# Patient Record
Sex: Male | Born: 1950 | Race: White | Hispanic: No | Marital: Married | Smoking: Never smoker
Health system: Southern US, Community
[De-identification: ages and names within clinical notes are randomized; demographics above are authoritative.]

## PROBLEM LIST (undated history)

## (undated) DIAGNOSIS — R471 Dysarthria and anarthria: Secondary | ICD-10-CM

## (undated) DIAGNOSIS — I87303 Chronic venous hypertension (idiopathic) without complications of bilateral lower extremity: Secondary | ICD-10-CM

## (undated) DIAGNOSIS — I639 Cerebral infarction, unspecified: Secondary | ICD-10-CM

## (undated) DIAGNOSIS — N183 Chronic kidney disease, stage 3 unspecified: Secondary | ICD-10-CM

## (undated) DIAGNOSIS — F418 Other specified anxiety disorders: Secondary | ICD-10-CM

## (undated) DIAGNOSIS — I2699 Other pulmonary embolism without acute cor pulmonale: Secondary | ICD-10-CM

## (undated) DIAGNOSIS — Z9989 Dependence on other enabling machines and devices: Secondary | ICD-10-CM

## (undated) DIAGNOSIS — I69398 Other sequelae of cerebral infarction: Secondary | ICD-10-CM

## (undated) DIAGNOSIS — M62830 Muscle spasm of back: Secondary | ICD-10-CM

## (undated) DIAGNOSIS — R269 Unspecified abnormalities of gait and mobility: Secondary | ICD-10-CM

## (undated) DIAGNOSIS — E785 Hyperlipidemia, unspecified: Secondary | ICD-10-CM

## (undated) DIAGNOSIS — I10 Essential (primary) hypertension: Secondary | ICD-10-CM

## (undated) DIAGNOSIS — G4733 Obstructive sleep apnea (adult) (pediatric): Secondary | ICD-10-CM

## (undated) DIAGNOSIS — E669 Obesity, unspecified: Secondary | ICD-10-CM

## (undated) HISTORY — PX: ANAL FISSURE REPAIR: SHX2312

## (undated) HISTORY — PX: TONSILLECTOMY: SUR1361

---
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
## 2000-06-30 ENCOUNTER — Encounter: Payer: Self-pay | Admitting: Emergency Medicine

## 2000-06-30 ENCOUNTER — Emergency Department (HOSPITAL_COMMUNITY): Admission: EM | Admit: 2000-06-30 | Discharge: 2000-06-30 | Payer: Self-pay | Admitting: Emergency Medicine

## 2000-09-29 ENCOUNTER — Encounter: Payer: Self-pay | Admitting: Urology

## 2000-10-01 ENCOUNTER — Encounter: Payer: Self-pay | Admitting: Urology

## 2000-10-01 ENCOUNTER — Ambulatory Visit (HOSPITAL_COMMUNITY): Admission: RE | Admit: 2000-10-01 | Discharge: 2000-10-01 | Payer: Self-pay | Admitting: Urology

## 2001-12-05 ENCOUNTER — Emergency Department (HOSPITAL_COMMUNITY): Admission: EM | Admit: 2001-12-05 | Discharge: 2001-12-05 | Payer: Self-pay | Admitting: *Deleted

## 2001-12-05 ENCOUNTER — Encounter: Payer: Self-pay | Admitting: *Deleted

## 2003-09-09 ENCOUNTER — Ambulatory Visit (HOSPITAL_BASED_OUTPATIENT_CLINIC_OR_DEPARTMENT_OTHER): Admission: RE | Admit: 2003-09-09 | Discharge: 2003-09-09 | Payer: Self-pay | Admitting: Internal Medicine

## 2004-10-20 ENCOUNTER — Emergency Department (HOSPITAL_COMMUNITY): Admission: EM | Admit: 2004-10-20 | Discharge: 2004-10-20 | Payer: Self-pay

## 2005-05-12 ENCOUNTER — Emergency Department (HOSPITAL_COMMUNITY): Admission: EM | Admit: 2005-05-12 | Discharge: 2005-05-12 | Payer: Self-pay | Admitting: Emergency Medicine

## 2006-02-20 ENCOUNTER — Emergency Department (HOSPITAL_COMMUNITY): Admission: EM | Admit: 2006-02-20 | Discharge: 2006-02-20 | Payer: Self-pay | Admitting: Emergency Medicine

## 2006-12-09 ENCOUNTER — Encounter: Admission: RE | Admit: 2006-12-09 | Discharge: 2006-12-09 | Payer: Self-pay | Admitting: Internal Medicine

## 2007-04-22 ENCOUNTER — Emergency Department (HOSPITAL_COMMUNITY): Admission: EM | Admit: 2007-04-22 | Discharge: 2007-04-22 | Payer: Self-pay | Admitting: Emergency Medicine

## 2007-08-12 ENCOUNTER — Ambulatory Visit (HOSPITAL_COMMUNITY): Admission: RE | Admit: 2007-08-12 | Discharge: 2007-08-12 | Payer: Self-pay | Admitting: General Surgery

## 2008-05-03 ENCOUNTER — Emergency Department (HOSPITAL_BASED_OUTPATIENT_CLINIC_OR_DEPARTMENT_OTHER): Admission: EM | Admit: 2008-05-03 | Discharge: 2008-05-03 | Payer: Self-pay | Admitting: Emergency Medicine

## 2008-06-17 ENCOUNTER — Encounter: Admission: RE | Admit: 2008-06-17 | Discharge: 2008-06-17 | Payer: Self-pay | Admitting: Internal Medicine

## 2009-01-16 ENCOUNTER — Encounter: Admission: RE | Admit: 2009-01-16 | Discharge: 2009-01-16 | Payer: Self-pay | Admitting: Internal Medicine

## 2011-04-01 NOTE — Op Note (Signed)
NAMEMAHKAI, Dakota Hall                 ACCOUNT NO.:  [id number]   MEDICAL RECORD NO.:  [id number]          PATIENT TYPE:  AMB   LOCATION:  DAY                          FACILITY:  [facility]   PHYSICIAN:  Ollen Gross. Vernell Morgans, M.D. DATE OF BIRTH:  01-05-1951   DATE OF PROCEDURE:  08/12/2007  DATE OF DISCHARGE:                               OPERATIVE REPORT   PREOPERATIVE DIAGNOSIS:  Umbilical hernia.   POSTOPERATIVE DIAGNOSIS:  Umbilical hernia.   PROCEDURES:  Umbilical hernia repair.   SURGEON:  Ollen Gross. Vernell Morgans, M.D.   ANESTHESIA:  General endotracheal.   PROCEDURE:  After informed consent was obtained, the patient was brought  to the operating room, placed in supine position on operating table.  After induction of general anesthesia, the patient's abdomen was prepped  with Betadine and draped in usual sterile manner.  The area around the  umbilicus infiltrated 4% Marcaine.  A small transverse supraumbilical  incision was made with a 15 blade knife.  This incision was carried down  through the skin and subcutaneous tissue sharply with electrocautery  until the fascia of the abdominal wall was encountered.  The hernia  defect was readily identified.  The hernia sac was opened sharply with  electrocautery.  There was only some preperitoneal fat within the  defect.  This was excised sharply with electrocautery.  The patient  actually had two adjacent defects, both less then 5 mm or so.  Once the  edges were cleaned of any tissue, the edges appeared to be healthy.  Each defect was closed with interrupted #1 Novofil stitches.  Because  the defects were so small it was decided not to placing the mesh.  Hemostasis was achieved using Bovie electrocautery.  Wound was irrigated  copious amounts of saline.  The deep layer of the wound was closed with  interrupted 3-0 Vicryl stitches.  The skin was closed with a running 4-0  Monocryl subcuticular stitch.  Benzoin, Steri-Strips and sterile  dressings  were applied.  The patient tolerated well.  At end of the case  all needle, sponge, instrument counts correct.  The patient was awake  and taken recovery in stable condition.      Ollen Gross. Vernell Morgans, M.D.  Electronically Signed     PST/MEDQ  D:  08/12/2007  T:  08/12/2007  Job:  [id number]

## 2011-08-28 LAB — DIFFERENTIAL
Basophils Absolute: 0
Basophils Relative: 0
Monocytes Absolute: 0.8 — ABNORMAL HIGH
Neutro Abs: 4.3
Neutrophils Relative %: 55

## 2011-08-28 LAB — BASIC METABOLIC PANEL
BUN: 11
CO2: 29
Calcium: 10.1
Creatinine, Ser: 1.26
GFR calc non Af Amer: 59 — ABNORMAL LOW
Glucose, Bld: 172 — ABNORMAL HIGH

## 2011-08-28 LAB — CBC
Hemoglobin: 15.8
MCHC: 34.5
Platelets: 217
RDW: 12.4

## 2012-05-15 ENCOUNTER — Inpatient Hospital Stay (HOSPITAL_COMMUNITY)
Admission: EM | Admit: 2012-05-15 | Discharge: 2012-05-18 | DRG: 066 | Disposition: A | Discharge status: Home / Self Care | Payer: Managed Care, Other (non HMO) | Attending: Internal Medicine | Admitting: Internal Medicine

## 2012-05-15 ENCOUNTER — Encounter (HOSPITAL_COMMUNITY): Payer: Self-pay

## 2012-05-15 ENCOUNTER — Emergency Department (HOSPITAL_COMMUNITY): Payer: Managed Care, Other (non HMO)

## 2012-05-15 ENCOUNTER — Inpatient Hospital Stay (HOSPITAL_COMMUNITY): Payer: Managed Care, Other (non HMO)

## 2012-05-15 DIAGNOSIS — E1165 Type 2 diabetes mellitus with hyperglycemia: Secondary | ICD-10-CM

## 2012-05-15 DIAGNOSIS — H539 Unspecified visual disturbance: Secondary | ICD-10-CM | POA: Diagnosis present

## 2012-05-15 DIAGNOSIS — I639 Cerebral infarction, unspecified: Secondary | ICD-10-CM

## 2012-05-15 DIAGNOSIS — R51 Headache: Secondary | ICD-10-CM | POA: Diagnosis present

## 2012-05-15 DIAGNOSIS — H532 Diplopia: Secondary | ICD-10-CM | POA: Diagnosis present

## 2012-05-15 DIAGNOSIS — I6350 Cerebral infarction due to unspecified occlusion or stenosis of unspecified cerebral artery: Secondary | ICD-10-CM

## 2012-05-15 DIAGNOSIS — Z9119 Patient's noncompliance with other medical treatment and regimen: Secondary | ICD-10-CM

## 2012-05-15 DIAGNOSIS — Z9114 Patient's other noncompliance with medication regimen: Secondary | ICD-10-CM

## 2012-05-15 DIAGNOSIS — I10 Essential (primary) hypertension: Secondary | ICD-10-CM

## 2012-05-15 DIAGNOSIS — IMO0001 Reserved for inherently not codable concepts without codable children: Secondary | ICD-10-CM

## 2012-05-15 DIAGNOSIS — Z6835 Body mass index (BMI) 35.0-35.9, adult: Secondary | ICD-10-CM

## 2012-05-15 DIAGNOSIS — R209 Unspecified disturbances of skin sensation: Secondary | ICD-10-CM | POA: Diagnosis present

## 2012-05-15 DIAGNOSIS — R471 Dysarthria and anarthria: Secondary | ICD-10-CM | POA: Diagnosis present

## 2012-05-15 DIAGNOSIS — R4781 Slurred speech: Secondary | ICD-10-CM | POA: Diagnosis present

## 2012-05-15 DIAGNOSIS — Z91148 Patient's other noncompliance with medication regimen for other reason: Secondary | ICD-10-CM

## 2012-05-15 DIAGNOSIS — E119 Type 2 diabetes mellitus without complications: Secondary | ICD-10-CM | POA: Diagnosis present

## 2012-05-15 DIAGNOSIS — Z91199 Patient's noncompliance with other medical treatment and regimen due to unspecified reason: Secondary | ICD-10-CM

## 2012-05-15 DIAGNOSIS — E669 Obesity, unspecified: Secondary | ICD-10-CM | POA: Diagnosis present

## 2012-05-15 DIAGNOSIS — R4789 Other speech disturbances: Secondary | ICD-10-CM | POA: Diagnosis present

## 2012-05-15 DIAGNOSIS — Z79899 Other long term (current) drug therapy: Secondary | ICD-10-CM

## 2012-05-15 DIAGNOSIS — E782 Mixed hyperlipidemia: Secondary | ICD-10-CM

## 2012-05-15 HISTORY — DX: Essential (primary) hypertension: I10

## 2012-05-15 LAB — POCT I-STAT, CHEM 8
Creatinine, Ser: 0.9 mg/dL (ref 0.50–1.35)
Glucose, Bld: 298 mg/dL — ABNORMAL HIGH (ref 70–99)
HCT: 51 % (ref 39.0–52.0)
Hemoglobin: 17.3 g/dL — ABNORMAL HIGH (ref 13.0–17.0)
Potassium: 4.2 mEq/L (ref 3.5–5.1)
Sodium: 141 mEq/L (ref 135–145)
TCO2: 24 mmol/L (ref 0–100)

## 2012-05-15 LAB — PROTIME-INR
INR: 1.03 (ref 0.00–1.49)
Prothrombin Time: 13.7 seconds (ref 11.6–15.2)

## 2012-05-15 LAB — COMPREHENSIVE METABOLIC PANEL
ALT: 40 U/L (ref 0–53)
Alkaline Phosphatase: 90 U/L (ref 39–117)
BUN: 11 mg/dL (ref 6–23)
CO2: 25 mEq/L (ref 19–32)
Chloride: 102 mEq/L (ref 96–112)
GFR calc Af Amer: >90 mL/min (ref >90)
GFR calc non Af Amer: 89 mL/min — ABNORMAL LOW (ref >90)
Glucose, Bld: 306 mg/dL — ABNORMAL HIGH (ref 70–99)
Potassium: 4.3 mEq/L (ref 3.5–5.1)
Sodium: 141 mEq/L (ref 135–145)
Total Bilirubin: 0.5 mg/dL (ref 0.3–1.2)
Total Protein: 7.8 g/dL (ref 6.0–8.3)

## 2012-05-15 LAB — CBC
Hemoglobin: 16.9 g/dL (ref 13.0–17.0)
MCH: 31.2 pg (ref 26.0–34.0)
Platelets: 196 10*3/uL (ref 150–400)
RBC: 5.41 MIL/uL (ref 4.22–5.81)
WBC: 10.9 10*3/uL — ABNORMAL HIGH (ref 4.0–10.5)

## 2012-05-15 LAB — DIFFERENTIAL
Eosinophils Absolute: 0 10*3/uL (ref 0.0–0.7)
Lymphocytes Relative: 19 % (ref 12–46)
Lymphs Abs: 2.1 10*3/uL (ref 0.7–4.0)
Monocytes Relative: 3 % (ref 3–12)
Neutro Abs: 8.5 10*3/uL — ABNORMAL HIGH (ref 1.7–7.7)
Neutrophils Relative %: 78 % — ABNORMAL HIGH (ref 43–77)

## 2012-05-15 LAB — GLUCOSE, CAPILLARY: Glucose-Capillary: 256 mg/dL — ABNORMAL HIGH (ref 70–99)

## 2012-05-15 LAB — RAPID URINE DRUG SCREEN, HOSP PERFORMED
Amphetamines: NOT DETECTED
Benzodiazepines: NOT DETECTED
Tetrahydrocannabinol: NOT DETECTED

## 2012-05-15 LAB — TROPONIN I: Troponin I: <0.3 ng/mL (ref <0.30)

## 2012-05-15 LAB — CK TOTAL AND CKMB (NOT AT ARMC): Total CK: 109 U/L (ref 7–232)

## 2012-05-15 MED ORDER — VALSARTAN 160 MG PO TABS
160.0000 mg | ORAL_TABLET | Freq: Two times a day (BID) | ORAL | Status: DC
  Administered 2012-05-15 – 2012-05-17 (×4): 160 mg via ORAL
  Filled 2012-05-15 (×5): qty 1

## 2012-05-15 MED ORDER — SODIUM CHLORIDE 0.9 % IV BOLUS (SEPSIS)
1000.0000 mL | Freq: Once | INTRAVENOUS | Status: AC
  Administered 2012-05-15: 1000 mL via INTRAVENOUS

## 2012-05-15 MED ORDER — SODIUM CHLORIDE 0.9 % IJ SOLN
3.0000 mL | Freq: Two times a day (BID) | INTRAMUSCULAR | Status: DC
  Administered 2012-05-15 – 2012-05-16 (×2): 3 mL via INTRAVENOUS

## 2012-05-15 MED ORDER — SODIUM CHLORIDE 0.9 % IV SOLN
INTRAVENOUS | Status: DC
  Administered 2012-05-15: 100 mL/h via INTRAVENOUS

## 2012-05-15 MED ORDER — SODIUM CHLORIDE 0.9 % IV SOLN: 250.0000 mL | INTRAVENOUS | Status: DC | PRN

## 2012-05-15 MED ORDER — SODIUM CHLORIDE 0.9 % IJ SOLN: 3.0000 mL | INTRAMUSCULAR | Status: DC | PRN

## 2012-05-15 MED ORDER — AMLODIPINE BESYLATE 10 MG PO TABS
10.0000 mg | ORAL_TABLET | Freq: Every day | ORAL | Status: DC
  Administered 2012-05-15 – 2012-05-18 (×4): 10 mg via ORAL
  Filled 2012-05-15 (×4): qty 1

## 2012-05-15 MED ORDER — BUSPIRONE HCL 5 MG PO TABS
15.0000 mg | ORAL_TABLET | Freq: Three times a day (TID) | ORAL | Status: DC
Start: 2012-05-15 — End: 2012-05-18
  Administered 2012-05-15 – 2012-05-18 (×8): 15 mg via ORAL
  Filled 2012-05-15 (×11): qty 3
  Filled 2012-05-15: qty 1

## 2012-05-15 MED ORDER — VALSARTAN-HYDROCHLOROTHIAZIDE 160-12.5 MG PO TABS: 1.0000 | ORAL_TABLET | Freq: Two times a day (BID) | ORAL | Status: DC

## 2012-05-15 MED ORDER — ASPIRIN 81 MG PO CHEW
324.0000 mg | CHEWABLE_TABLET | Freq: Once | ORAL | Status: AC
  Administered 2012-05-15: 324 mg via ORAL

## 2012-05-15 MED ORDER — BIOTENE DRY MOUTH MT LIQD
15.0000 mL | Freq: Two times a day (BID) | OROMUCOSAL | Status: DC
  Administered 2012-05-15 – 2012-05-18 (×5): 15 mL via OROMUCOSAL

## 2012-05-15 MED ORDER — SERTRALINE HCL 50 MG PO TABS
150.0000 mg | ORAL_TABLET | Freq: Every day | ORAL | Status: DC
  Administered 2012-05-15 – 2012-05-18 (×4): 150 mg via ORAL
  Filled 2012-05-15 (×4): qty 1

## 2012-05-15 MED ORDER — HYDROCHLOROTHIAZIDE 12.5 MG PO CAPS
12.5000 mg | ORAL_CAPSULE | Freq: Two times a day (BID) | ORAL | Status: DC
  Administered 2012-05-15 – 2012-05-17 (×4): 12.5 mg via ORAL
  Filled 2012-05-15 (×5): qty 1

## 2012-05-15 MED ORDER — INSULIN ASPART 100 UNIT/ML ~~LOC~~ SOLN
0.0000 [IU] | Freq: Three times a day (TID) | SUBCUTANEOUS | Status: DC
  Administered 2012-05-16: 3 [IU] via SUBCUTANEOUS
  Administered 2012-05-16: 5 [IU] via SUBCUTANEOUS

## 2012-05-15 MED ORDER — ASPIRIN 300 MG RE SUPP
300.0000 mg | Freq: Every day | RECTAL | Status: DC
  Filled 2012-05-15 (×2): qty 1

## 2012-05-15 MED ORDER — SENNOSIDES-DOCUSATE SODIUM 8.6-50 MG PO TABS
1.0000 | ORAL_TABLET | Freq: Every evening | ORAL | Status: DC | PRN
  Filled 2012-05-15: qty 1

## 2012-05-15 MED ORDER — ASPIRIN 325 MG PO TABS
325.0000 mg | ORAL_TABLET | Freq: Every day | ORAL | Status: DC
  Administered 2012-05-16 – 2012-05-18 (×3): 325 mg via ORAL
  Filled 2012-05-15 (×4): qty 1

## 2012-05-15 NOTE — ED Notes (Signed)
Per EMS, pt was outside doing yard work and began having dizziness, double vision, and slurred speech.  Pt's family tried for an hour to get pt to come to hospital for eval before he would agree.  Per EMS, BP  204/92, P  62, CBG 245.  Pt with hx of HTN, DM T2, depression, anxiety.  Non-compliant with all meds.

## 2012-05-15 NOTE — Consult Note (Signed)
Reason for Consult: Code Stroke Referring Physician: Dr Junius Argyle is an 61 y.o. male.  HPI: sudden onset dizziness,perioral numbness and horizontal diplopia at 1 pm today while working in yard. Wife noticed slurred speech as well. EMS reported elevated BP. He is feeling better now but dysarthria and diplopia persist.Ct head shows no acute abnormality only mild small vessel disease and generalized atrophy.. H/O similar episode 1 month ago at work without diplopia resolved in 1 hour did not seek medical help. He has been noncompliant with his medicines and not taken them for 2 weeks except depression pills twice last week only.No known h/o documented stroke or TIA.  Last seen normal : 1 pm today TPA given : No because outside time window  Past Medical History  Diagnosis Date  . Diabetes mellitus   . Hypertension    Depression Obesity  Past Surgical History  Procedure Date  . Tonsillectomy   . Anal fissure repair     No family history on file.  Social History:  reports that he has never smoked. He does not have any smokeless tobacco history on file. He reports that he does not drink alcohol or use illicit drugs.  Allergies:Penicillin  Medications: I have reviewed the patient's current medications.  No results found for this or any previous visit (from the past 48 hour(s)).  Ct Head Wo Contrast  05/15/2012  *RADIOLOGY REPORT*  Clinical Data:  Slurred speech, blurred vision  CT HEAD WITHOUT CONTRAST  Technique:  Contiguous axial images were obtained from the base of the skull through the vertex without contrast.  Comparison: None  Findings: Mild atrophy. Normal ventricular morphology. No midline shift or mass effect. Extensive small vessel chronic ischemic changes of deep cerebral white matter. Arachnoid granulations identified at the sigmoid sinuses bilaterally. No intracranial hemorrhage, mass lesion, or evidence of acute infarction. No extra-axial fluid collections.  Visualized paranasal sinuses and mastoid air cells clear. No acute osseous findings.  IMPRESSION: Atrophy with extensive small vessel chronic ischemic changes of deep cerebral white matter. No acute intracranial abnormalities.  Findings discussed with Dr. Karma Ganja prior to dictation of this report.  Original Report Authenticated By: Lollie Marrow, M.D.       ROS  ; positive for dysarthria, diplopia, dizziness and numbness, depression, emotional lability.  negative for chest pain, palpititons, shortness of breath, nausea, vomitting, diarrhoea.  .Blood pressure 204/91, temperature 98.5 F (36.9 C), temperature source Oral, resp. rate 20, SpO2 97.00%.  PHYSICAL EXAM ;Awake alert. Emotionally labile obese Caucasian male. Afebrile. Head is nontraumatic. Neck is supple without bruit. Hearing is normal. Cardiac exam no murmur or gallop. Lungs are clear to auscultation. Distal pulses are well felt. No pedal edema.  Neurological Exam : Awake  Alert oriented x 3.crying during exam. 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 Face symmetric.Good cough and gag. Tongue midline. Normal strength, tone, reflexes and coordination. Normal sensation. Gait deferred.   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 Plan ; admit to medicine team for stroke w/u and risk stratification. Aspirin after swallow evaluation. Check MRI,MRA brain, carotid dopplers, 2Decho, fasting lipids, HbA1c. PT,OT consults. Consider POINT study participation but patient declined.D/W patient, wife and children.    Telsa Dillavou,PRAMODKUMAR P  05/15/2012, 6:09 PM

## 2012-05-15 NOTE — Code Documentation (Signed)
Code stroke called at 1659, Patient arrived to [facility] via EMs at 1716, stroke team arrived at 1712, CT scan at 1721, lab at 1714, ct read at 49.  LSN 1300, as per patient he was in the yard doing work and developed a headache/ blurred vision and trouble with balance.  Patient hypertensive on arrival, non compliant with home meds.  NIHSS 2 Dr Pearlean Brownie at bedside

## 2012-05-15 NOTE — H&P (Signed)
Chief Complaint:  Slurred speech  HPI: 61 yo male h/o htn, dm off bp meds for over 2 weeks comes in with sudden onset of dizziness, double vision, slurred speech with perioral numbness and tongue numbness.  Symptoms have improved in ED.  Code stroke called and not tpa candidate.  Denies any weakness/n/t in extremities.  Denies any facial drooping.  Does not monitor bp at home but here initial sbp over 200.  No recent illnesses, no n/v/fevers/cp/sob.  Review of Systems:  O/w neg  Past Medical History: Past Medical History  Diagnosis Date  . Diabetes mellitus   . Hypertension    Past Surgical History  Procedure Date  . Tonsillectomy   . Anal fissure repair     Medications: Prior to Admission medications   Medication Sig Start Date End Date Taking? Authorizing Provider  amLODipine (NORVASC) 10 MG tablet Take 10 mg by mouth daily.   Yes Historical Provider, MD  busPIRone (BUSPAR) 15 MG tablet Take 15 mg by mouth 3 (three) times daily.   Yes Historical Provider, MD  metFORMIN (GLUCOPHAGE-XR) 500 MG 24 hr tablet Take 1,000 mg by mouth daily with breakfast.   Yes Historical Provider, MD  pioglitazone (ACTOS) 15 MG tablet Take 15 mg by mouth daily.   Yes Historical Provider, MD  sertraline (ZOLOFT) 100 MG tablet Take 150 mg by mouth daily.   Yes Historical Provider, MD  traZODone (DESYREL) 50 MG tablet Take 50-100 mg by mouth daily as needed. For sleep   Yes Historical Provider, MD  valsartan-hydrochlorothiazide (DIOVAN-HCT) 160-12.5 MG per tablet Take 1 tablet by mouth 2 (two) times daily.   Yes Historical Provider, MD    Allergies:   Allergies  Allergen Reactions  . Penicillins Hives    Social History:  reports that he has never smoked. He does not have any smokeless tobacco history on file. He reports that he does not drink alcohol or use illicit drugs.  Physical Exam: Filed Vitals:   05/15/12 1841 05/15/12 1846 05/15/12 1900 05/15/12 2002  BP:  184/88 148/65 164/72    Pulse:   57 58  Temp: 98.5 F (36.9 C)   98.3 F (36.8 C)  TempSrc:      Resp:  18 15 21   SpO2:  98% 97% 97%   General appearance: alert, cooperative and no distress Lungs: clear to auscultation bilaterally Heart: regular rate and rhythm, S1, S2 normal, no murmur, click, rub or gallop Abdomen: soft, non-tender; bowel sounds normal; no masses,  no organomegaly Extremities: extremities normal, atraumatic, no cyanosis or edema Pulses: 2+ and symmetric Skin: Skin color, texture, turgor normal. No rashes or lesions Neurologic: Mental status: Alert, oriented, thought content appropriate Cranial nerves: normal Motor: grossly normal Reflexes: normal 2+ and symmetric    Labs on Admission:   [facility] 05/15/12 1827 05/15/12 1815  NA 141 141  K 4.2 4.3  CL 104 102  CO2 -- 25  GLUCOSE 298* 306*  BUN 10 11  CREATININE 0.90 0.93  CALCIUM -- 9.9  MG -- --  PHOS -- --    Basename 05/15/12 1815  AST 29  ALT 40  ALKPHOS 90  BILITOT 0.5  PROT 7.8  ALBUMIN 4.2    Basename 05/15/12 1827 05/15/12 1815  WBC -- 10.9*  NEUTROABS -- 8.5*  HGB 17.3* 16.9  HCT 51.0 47.1  MCV -- 87.1  PLT -- 196    Basename 05/15/12 1815  CKTOTAL 109  CKMB 2.3  CKMBINDEX --  TROPONINI <0.30  Radiological Exams on Admission: Ct Head Wo Contrast  05/15/2012  *RADIOLOGY REPORT*  Clinical Data:  Slurred speech, blurred vision  CT HEAD WITHOUT CONTRAST  Technique:  Contiguous axial images were obtained from the base of the skull through the vertex without contrast.  Comparison: None  Findings: Mild atrophy. Normal ventricular morphology. No midline shift or mass effect. Extensive small vessel chronic ischemic changes of deep cerebral white matter. Arachnoid granulations identified at the sigmoid sinuses bilaterally. No intracranial hemorrhage, mass lesion, or evidence of acute infarction. No extra-axial fluid collections. Visualized paranasal sinuses and mastoid air cells clear. No acute osseous  findings.  IMPRESSION: Atrophy with extensive small vessel chronic ischemic changes of deep cerebral white matter. No acute intracranial abnormalities.  Findings discussed with Dr. Karma Ganja prior to dictation of this report.  Original Report Authenticated By: Lollie Marrow, M.D.    Assessment/Plan Present on Admission:  61 yo male r/o cva with uncont htn .Slurred speech .HTN (hypertension) .DM (diabetes mellitus) .Vision changes  cva pathway with full w/u.  Asa.  Neuro following.  Cth negative.  ekg nsr.  Place in stepdown overnight.  Advised needs to take bp meds on a chronic basis and importance of compliance with bp and dm meds.  Place on ssi.   [name] [phone] 05/15/2012, 8:26 PM

## 2012-05-16 ENCOUNTER — Inpatient Hospital Stay (HOSPITAL_COMMUNITY): Payer: Managed Care, Other (non HMO)

## 2012-05-16 DIAGNOSIS — IMO0001 Reserved for inherently not codable concepts without codable children: Secondary | ICD-10-CM

## 2012-05-16 DIAGNOSIS — I6340 Cerebral infarction due to embolism of unspecified cerebral artery: Secondary | ICD-10-CM

## 2012-05-16 DIAGNOSIS — E1165 Type 2 diabetes mellitus with hyperglycemia: Secondary | ICD-10-CM

## 2012-05-16 DIAGNOSIS — H532 Diplopia: Secondary | ICD-10-CM

## 2012-05-16 DIAGNOSIS — E782 Mixed hyperlipidemia: Secondary | ICD-10-CM

## 2012-05-16 DIAGNOSIS — I6350 Cerebral infarction due to unspecified occlusion or stenosis of unspecified cerebral artery: Secondary | ICD-10-CM

## 2012-05-16 DIAGNOSIS — I10 Essential (primary) hypertension: Secondary | ICD-10-CM

## 2012-05-16 LAB — MRSA PCR SCREENING: MRSA by PCR: NEGATIVE

## 2012-05-16 LAB — GLUCOSE, CAPILLARY
Glucose-Capillary: 223 mg/dL — ABNORMAL HIGH (ref 70–99)
Glucose-Capillary: 267 mg/dL — ABNORMAL HIGH (ref 70–99)

## 2012-05-16 LAB — HEMOGLOBIN A1C: Hgb A1c MFr Bld: 10.8 % — ABNORMAL HIGH (ref <5.7)

## 2012-05-16 LAB — LIPID PANEL: LDL Cholesterol: 129 mg/dL — ABNORMAL HIGH (ref 0–99)

## 2012-05-16 MED ORDER — INSULIN ASPART 100 UNIT/ML ~~LOC~~ SOLN
0.0000 [IU] | Freq: Three times a day (TID) | SUBCUTANEOUS | Status: DC
  Administered 2012-05-17: 11 [IU] via SUBCUTANEOUS
  Administered 2012-05-17 – 2012-05-18 (×3): 7 [IU] via SUBCUTANEOUS
  Administered 2012-05-18: 4 [IU] via SUBCUTANEOUS

## 2012-05-16 MED ORDER — PIOGLITAZONE HCL 15 MG PO TABS
15.0000 mg | ORAL_TABLET | Freq: Every day | ORAL | Status: DC
  Administered 2012-05-16 – 2012-05-18 (×3): 15 mg via ORAL
  Filled 2012-05-16 (×3): qty 1

## 2012-05-16 MED ORDER — MORPHINE SULFATE 2 MG/ML IJ SOLN
1.0000 mg | INTRAMUSCULAR | Status: DC | PRN
  Administered 2012-05-16: 2 mg via INTRAVENOUS
  Filled 2012-05-16: qty 1

## 2012-05-16 MED ORDER — INSULIN GLARGINE 100 UNIT/ML ~~LOC~~ SOLN
10.0000 [IU] | Freq: Every day | SUBCUTANEOUS | Status: DC
  Administered 2012-05-16: 10 [IU] via SUBCUTANEOUS

## 2012-05-16 MED ORDER — ACETAMINOPHEN 650 MG RE SUPP
650.0000 mg | Freq: Four times a day (QID) | RECTAL | Status: DC | PRN
  Filled 2012-05-16: qty 1

## 2012-05-16 MED ORDER — HYDRALAZINE HCL 20 MG/ML IJ SOLN
10.0000 mg | INTRAMUSCULAR | Status: DC | PRN
  Administered 2012-05-16: 10 mg via INTRAVENOUS
  Filled 2012-05-16: qty 0.5

## 2012-05-16 MED ORDER — INSULIN ASPART 100 UNIT/ML ~~LOC~~ SOLN
0.0000 [IU] | Freq: Every day | SUBCUTANEOUS | Status: DC
  Administered 2012-05-16: 3 [IU] via SUBCUTANEOUS

## 2012-05-16 MED ORDER — CARVEDILOL 3.125 MG PO TABS
3.1250 mg | ORAL_TABLET | Freq: Two times a day (BID) | ORAL | Status: DC
  Filled 2012-05-16 (×2): qty 1

## 2012-05-16 MED ORDER — OXYCODONE HCL 5 MG PO TABS: 5.0000 mg | ORAL_TABLET | ORAL | Status: DC | PRN

## 2012-05-16 MED ORDER — HYDRALAZINE HCL 20 MG/ML IJ SOLN
10.0000 mg | Freq: Four times a day (QID) | INTRAMUSCULAR | Status: DC | PRN
  Administered 2012-05-16: 10 mg via INTRAVENOUS
  Filled 2012-05-16 (×2): qty 1

## 2012-05-16 NOTE — ED Provider Notes (Addendum)
History     CSN: [id number]  Arrival date & time 05/15/12  1717   First MD Initiated Contact with Patient 05/15/12 1726      Chief Complaint  Patient presents with  . Headache  . Dizziness    (Consider location/radiation/quality/duration/timing/severity/associated sxs/prior treatment) HPI A LEVEL 5 CAVEAT PERTAINS DUE TO ALTERED MENTAL STATUS Pt presents with c/o altered mental status.  Per family pt was out fishing and then afterwards he had slurred speech and dizziness.    Past Medical History  Diagnosis Date  . Diabetes mellitus   . Hypertension     Past Surgical History  Procedure Date  . Tonsillectomy   . Anal fissure repair     History reviewed. No pertinent family history.  History  Substance Use Topics  . Smoking status: Never Smoker   . Smokeless tobacco: Not on file  . Alcohol Use: No      Review of Systems UNABLE TO OBTAIN ROS DUE TO LEVEL 5 CAVEAT  Allergies  Penicillins  Home Medications  No current outpatient prescriptions on file.  BP 153/79  Pulse 56  Temp 98.4 F (36.9 C) (Oral)  Resp 25  Ht 5\' 8"  (1.727 m)  Wt 232 lb 12.9 oz (105.6 kg)  BMI 35.40 kg/m2  SpO2 98% Vitals reviewed Physical Exam Physical Examination: General appearance - alert, somewhat somnolent but following commands, ill appearing Mental status - not able to answer orientation questions Eyes - pupils equal and reactive, extraocular eye movements intact grossly Mouth - mucous membranes moist, pharynx normal without lesions Chest - clear to auscultation, no wheezes, rales or rhonchi, symmetric air entry Heart - normal rate, regular rhythm, normal S1, S2, no murmurs, rubs, clicks or gallops Abdomen - soft, nontender, nondistended, no masses or organomegaly Neurological - alert, slurred speech, ? Aphasia, moving all extremities, ataxia with heel to shin in right leg, decreased sensation over left hand Musculoskeletal - no joint tenderness, deformity or  swelling Extremities - peripheral pulses normal, no pedal edema, no clubbing or cyanosis Skin - normal coloration and turgor  ED Course  Procedures (including critical care time)   Date: 06/17/2012  Rate: 68  Rhythm: normal sinus rhythm  QRS Axis: normal  Intervals: normal  ST/T Wave abnormalities: nonspecific T wave changes- diffuse t wave flattening  Conduction Disutrbances:none  Narrative Interpretation:   Old EKG Reviewed: none available    Labs Reviewed  GLUCOSE, CAPILLARY - Abnormal; Notable for the following:    Glucose-Capillary 295 (*)     All other components within normal limits  CBC - Abnormal; Notable for the following:    WBC 10.9 (*)     All other components within normal limits  DIFFERENTIAL - Abnormal; Notable for the following:    Neutrophils Relative 78 (*)     Neutro Abs 8.5 (*)     All other components within normal limits  COMPREHENSIVE METABOLIC PANEL - Abnormal; Notable for the following:    Glucose, Bld 306 (*)     GFR calc non Af Amer 89 (*)     All other components within normal limits  POCT I-STAT, CHEM 8 - Abnormal; Notable for the following:    Glucose, Bld 298 (*)     Hemoglobin 17.3 (*)     All other components within normal limits  HEMOGLOBIN A1C - Abnormal; Notable for the following:    Hemoglobin A1C 10.8 (*)     Mean Plasma Glucose 263 (*)     All other components  within normal limits  LIPID PANEL - Abnormal; Notable for the following:    Cholesterol 202 (*)     LDL Cholesterol 129 (*)     All other components within normal limits  GLUCOSE, CAPILLARY - Abnormal; Notable for the following:    Glucose-Capillary 256 (*)     All other components within normal limits  GLUCOSE, CAPILLARY - Abnormal; Notable for the following:    Glucose-Capillary 266 (*)     All other components within normal limits  GLUCOSE, CAPILLARY - Abnormal; Notable for the following:    Glucose-Capillary 230 (*)     All other components within normal limits   GLUCOSE, CAPILLARY - Abnormal; Notable for the following:    Glucose-Capillary 223 (*)     All other components within normal limits  GLUCOSE, CAPILLARY - Abnormal; Notable for the following:    Glucose-Capillary 267 (*)     All other components within normal limits  PROTIME-INR  APTT  CK TOTAL AND CKMB  TROPONIN I  URINE RAPID DRUG SCREEN (HOSP PERFORMED)  MRSA PCR SCREENING  CBC   Dg Chest 2 View  05/16/2012  *RADIOLOGY REPORT*  Clinical Data: Nausea, diabetes  CHEST - 2 VIEW  Comparison: 08/09/2007  Findings: Mild cardiomegaly.  Low lung volumes with resultant crowding of bronchovascular structures.  No confluent airspace infiltrate or overt edema.  No definite effusion.  Mildly tortuous thoracic aorta.  IMPRESSION:  1.  Mild cardiomegaly.  Low lung volumes.  Original Report Authenticated By: Osa Craver, M.D.   Ct Head Wo Contrast  05/15/2012  *RADIOLOGY REPORT*  Clinical Data:  Slurred speech, blurred vision  CT HEAD WITHOUT CONTRAST  Technique:  Contiguous axial images were obtained from the base of the skull through the vertex without contrast.  Comparison: None  Findings: Mild atrophy. Normal ventricular morphology. No midline shift or mass effect. Extensive small vessel chronic ischemic changes of deep cerebral white matter. Arachnoid granulations identified at the sigmoid sinuses bilaterally. No intracranial hemorrhage, mass lesion, or evidence of acute infarction. No extra-axial fluid collections. Visualized paranasal sinuses and mastoid air cells clear. No acute osseous findings.  IMPRESSION: Atrophy with extensive small vessel chronic ischemic changes of deep cerebral white matter. No acute intracranial abnormalities.  Findings discussed with Dr. Karma Ganja prior to dictation of this report.  Original Report Authenticated By: Lollie Marrow, M.D.   Mr Brain Wo Contrast  05/16/2012  *RADIOLOGY REPORT*  Clinical Data:  Slurred speech.  Hypertension.  Dizziness.  Visual  disturbance.  MRI HEAD WITHOUT CONTRAST MRA HEAD WITHOUT CONTRAST  Technique:  Multiplanar, multiecho pulse sequences of the brain and surrounding structures were obtained without intravenous contrast. Angiographic images of the head were obtained using MRA technique without contrast.  Comparison:  Head CT 06/29.  MRI 01/16/2009  MRI HEAD  Findings:  Diffusion imaging shows foci of acute infarction affecting the left side of the pons.  The largest area is a 1.2 cm region in the ventral left pons.  There is a 5 mm focus in the dorsal left pons extending to the floor of the fourth ventricle. There is a 3-4 mm acute infarction within the right parietal subcortical white matter.  No other acute infarction.  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  The areas of infarction do not show hemorrhage.  Elsewhere, there are chronic small vessel changes within the pons. No focal cerebellar insult.  The cerebral hemispheres show extensive chronic small vessel changes affecting the deep and subcortical  white matter.  No large vessel territory infarction. No mass lesion, hemorrhage, hydrocephalus or extra-axial collection.  No pituitary mass.  No inflammatory sinus disease.  IMPRESSION: 1.2 cm acute infarction within the left ventral pons.  5 mm acute infarction within the dorsal left pons.  The 3-4 mm acute infarction within the right parietal subcortical white matter. 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  No hemorrhage.  Extensive chronic small vessel changes elsewhere throughout the brain.  MRA HEAD  Findings: Both internal carotid arteries are widely patent into the brain.  No siphon narrowing or irregularity.  The anterior and middle cerebral vessels are patent without proximal stenosis, aneurysm or vascular malformation.  More distal branch vessels shows some atherosclerotic irregularity.  Both vertebral arteries are widely patent to the basilar.  There is mild atherosclerotic irregularity  of the distal left vertebral artery but no flow- limiting stenosis.  The basilar artery shows mild irregularity but no stenosis.  Posterior circulation branch vessels are patent, with mild atherosclerotic irregularity of the distal branches.  IMPRESSION:  No major vessel occlusion or correctable proximal stenosis.  Distal vessel atherosclerotic changes diffusely.  Original Report Authenticated By: Thomasenia Sales, M.D.   Mr Mra Head/brain Wo Cm  05/16/2012  *RADIOLOGY REPORT*  Clinical Data:  Slurred speech.  Hypertension.  Dizziness.  Visual disturbance.  MRI HEAD WITHOUT CONTRAST MRA HEAD WITHOUT CONTRAST  Technique:  Multiplanar, multiecho pulse sequences of the brain and surrounding structures were obtained without intravenous contrast. Angiographic images of the head were obtained using MRA technique without contrast.  Comparison:  Head CT 06/29.  MRI 01/16/2009  MRI HEAD  Findings:  Diffusion imaging shows foci of acute infarction affecting the left side of the pons.  The largest area is a 1.2 cm region in the ventral left pons.  There is a 5 mm focus in the dorsal left pons extending to the floor of the fourth ventricle. There is a 3-4 mm acute infarction within the right parietal subcortical white matter.  No other acute infarction.  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  The areas of infarction do not show hemorrhage.  Elsewhere, there are chronic small vessel changes within the pons. No focal cerebellar insult.  The cerebral hemispheres show extensive chronic small vessel changes affecting the deep and subcortical white matter.  No large vessel territory infarction. No mass lesion, hemorrhage, hydrocephalus or extra-axial collection.  No pituitary mass.  No inflammatory sinus disease.  IMPRESSION: 1.2 cm acute infarction within the left ventral pons.  5 mm acute infarction within the dorsal left pons.  The 3-4 mm acute infarction within the right parietal subcortical white  matter. 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  No hemorrhage.  Extensive chronic small vessel changes elsewhere throughout the brain.  MRA HEAD  Findings: Both internal carotid arteries are widely patent into the brain.  No siphon narrowing or irregularity.  The anterior and middle cerebral vessels are patent without proximal stenosis, aneurysm or vascular malformation.  More distal branch vessels shows some atherosclerotic irregularity.  Both vertebral arteries are widely patent to the basilar.  There is mild atherosclerotic irregularity of the distal left vertebral artery but no flow- limiting stenosis.  The basilar artery shows mild irregularity but no stenosis.  Posterior circulation branch vessels are patent, with mild atherosclerotic irregularity of the distal branches.  IMPRESSION:  No major vessel occlusion or correctable proximal stenosis.  Distal vessel atherosclerotic changes diffusely.  Original Report Authenticated By: Thomasenia Sales, M.D.  1. Slurred speech   2. DM (diabetes mellitus)   3. HTN (hypertension)   4. Noncompliance with medication regimen       MDM  Pt seen emergently upon arrival for code stroke activation- head ct without any acute findings.  Pt seen by neurology and subsequently admitted to medicine for further evaluation.  Several CBgs were checked and he was given d50- which did seem to help his mental status.         Ethelda Chick, MD 05/16/12 2358  Ethelda Chick, MD 06/17/12 [phone]

## 2012-05-16 NOTE — Progress Notes (Signed)
VASCULAR LAB PRELIMINARY  PRELIMINARY  PRELIMINARY  PRELIMINARY  Carotid duplex completed.    Preliminary report:  Bilateral:  No evidence of hemodynamically significant internal carotid artery stenosis.   Vertebral artery flow is antegrade.      Reyanne Hussar, RVT 05/16/2012, 3:43 PM

## 2012-05-16 NOTE — Evaluation (Signed)
Physical Therapy Evaluation Patient Details Name: Dakota Hall MRN: [id number] DOB: [birth year] Today's Date: 05/16/2012 Time: 7829-5621 PT Time Calculation (min): 15 min  PT Assessment / Plan / Recommendation Clinical Impression  Pt presents with a medical diagnosis of L brainstem infarct with symptoms including double vision and slurred speech. Upon sitting, pt with increased BP and not feeling well, therefore was layed back down. Will continue reasessment next session to evaluate balance and gait.    PT Assessment  Patient needs continued PT services    Follow Up Recommendations  Other (comment) (TBD)    Barriers to Discharge        Equipment Recommendations  Other (comment) (TBD)    Recommendations for Other Services     Frequency Min 4X/week    Precautions / Restrictions Restrictions Weight Bearing Restrictions: No         Mobility  Bed Mobility Bed Mobility: Supine to Sit;Sitting - Scoot to Edge of Bed Supine to Sit: 6: Modified independent (Device/Increase time) Sitting - Scoot to Edge of Bed: 6: Modified independent (Device/Increase time) Sit to Supine: 6: Modified independent (Device/Increase time) Transfers Transfers: Not assessed Modified Rankin (Stroke Patients Only) Pre-Morbid Rankin Score: No symptoms Modified Rankin:  (unable to assess)    Exercises     PT Diagnosis: Difficulty walking  PT Problem List: Decreased mobility;Decreased activity tolerance;Decreased knowledge of use of DME;Decreased safety awareness;Decreased knowledge of precautions PT Treatment Interventions: DME instruction;Gait training;Stair training;Functional mobility training;Therapeutic activities;Therapeutic exercise;Neuromuscular re-education;Patient/family education   PT Goals Acute Rehab PT Goals PT Goal Formulation: With patient Time For Goal Achievement: 05/23/12 Potential to Achieve Goals: Good Pt will go Sit to Stand: with modified independence PT Goal: Sit to Stand -  Progress: Goal set today Pt will go Stand to Sit: with modified independence PT Goal: Stand to Sit - Progress: Goal set today Pt will Transfer Bed to Chair/Chair to Bed: with modified independence PT Transfer Goal: Bed to Chair/Chair to Bed - Progress: Goal set today Pt will Ambulate: >150 feet;with modified independence;with least restrictive assistive device PT Goal: Ambulate - Progress: Goal set today Pt will Go Up / Down Stairs: Flight;with modified independence;with rail(s) PT Goal: Up/Down Stairs - Progress: Goal set today  Visit Information  Last PT Received On: 05/16/12 Assistance Needed: +1 PT/OT Co-Evaluation/Treatment: Yes    Subjective Data      Prior Functioning  Home Living Lives With: Spouse Available Help at Discharge: Family;Available 24 hours/day Type of Home: House Home Access: Stairs to enter [employer] of Steps: 4 Entrance Stairs-Rails: Right;Left;Can reach both Home Layout: Two level;Bed/bath upstairs;1/2 bath on main level Alternate Level Stairs-Number of Steps: 13 Alternate Level Stairs-Rails: Left Bathroom Shower/Tub: Health visitor: Standard Bathroom Accessibility: Yes How Accessible: Accessible via walker Home Adaptive Equipment: None Prior Function Level of Independence: Independent Driving: Yes Vocation: Workers Chief Operating Officer: Expressive difficulties Dominant Hand: Right    Cognition  Overall Cognitive Status: Appears within functional limits for tasks assessed/performed Arousal/Alertness: Awake/alert Orientation Level: Appears intact for tasks assessed Behavior During Session: [facility] for tasks performed    Extremity/Trunk Assessment Right Lower Extremity Assessment RLE ROM/Strength/Tone: Within functional levels RLE Sensation: WFL - Light Touch Left Lower Extremity Assessment LLE ROM/Strength/Tone: Within functional levels LLE Sensation: WFL - Light Touch   Balance Balance Balance  Assessed: Yes Static Sitting Balance Static Sitting - Balance Support: Bilateral upper extremity supported;Feet supported Static Sitting - Level of Assistance: 7: Independent Static Sitting - Comment/# of Minutes: pt able to  sit at EOB for 3 minutes with no assistance. Upon sitting, BP taken: 181/84 and pt not feeling well, requesting to lay back down.   End of Session PT - End of Session Activity Tolerance: Treatment limited secondary to medical complications (Comment) Patient left: in bed;with call bell/phone within reach;with family/visitor present Nurse Communication: Mobility status    Milana Kidney 05/16/2012, 3:05 PM  05/16/2012 Milana Kidney DPT PAGER: [phone] OFFICE: [phone]

## 2012-05-16 NOTE — Progress Notes (Signed)
SLP Cancellation Note  Evaluation cancelled today due to pt passed RN stroke swallow screen. RN to initiate diet. Please reconsult ST if needs arise. Dakota Hall, [facility], CCC-SLP [phone]  Dakota Hall, Dakota Hall 05/16/2012, 12:14 PM

## 2012-05-16 NOTE — Progress Notes (Signed)
Pt's BP 181/80; NP notified; new orders received; will continue to monitor

## 2012-05-16 NOTE — Progress Notes (Signed)
  Echocardiogram 2D Echocardiogram has been performed.  Dakota Hall 05/16/2012, 3:20 PM

## 2012-05-16 NOTE — Progress Notes (Signed)
Occupational Therapy Evaluation Patient Details Name: Dakota Hall MRN: [id number] DOB: 1951/10/29 Today's Date: 05/16/2012 Time:  -     OT Assessment / Plan / Recommendation Clinical Impression  Pt admitted with a medical diagnosis of L brainstem infarct with symptoms including double vision and slurred speech. Upon sitting, pt with increased BP and not feeling well, therefore was layed back down. Will benefit from acute OT to address below problem list in prep for d/c home.     OT Assessment  Patient needs continued OT Services    Follow Up Recommendations  Other (comment) (TBD)    Barriers to Discharge      Equipment Recommendations  Other (comment) (TBD)    Recommendations for Other Services    Frequency  Min 3X/week    Precautions / Restrictions Restrictions Weight Bearing Restrictions: No   Pertinent Vitals/Pain BP reading 181/84 while sitting EOB.  Layed pt back down. RN aware.    ADL  Upper Body Dressing: Performed;Independent Where Assessed - Upper Body Dressing: Unsupported sitting Lower Body Dressing: Performed;Independent Where Assessed - Lower Body Dressing: Unsupported sitting Transfers/Ambulation Related to ADLs: Not attempted due to pt with high BP and not feeling well ADL Comments: Pt independent with ADLs at bed level. While sitting EOB, pt with BP reading 181/84 and reporting he didn't feel.  Pt returned supine in bed.      OT Diagnosis: Generalized weakness;Disturbance of vision  OT Problem List: Decreased activity tolerance;Impaired vision/perception OT Treatment Interventions: Self-care/ADL training;Visual/perceptual remediation/compensation;Patient/family education;Therapeutic activities   OT Goals Acute Rehab OT Goals OT Goal Formulation: With patient Time For Goal Achievement: 05/23/12 Potential to Achieve Goals: Good ADL Goals Pt Will Transfer to Toilet: Independently;Ambulation;Comfort height toilet ADL Goal: Toilet Transfer - Progress: Goal  set today Miscellaneous OT Goals Miscellaneous OT Goal #1: Pt will independently retrieve ADL items in prep for grooming at sink. OT Goal: Miscellaneous Goal #1 - Progress: Goal set today Miscellaneous OT Goal #2: Pt will independently perform vision HEP. OT Goal: Miscellaneous Goal #2 - Progress: Goal set today  Visit Information  Assistance Needed: +1    Subjective Data      Prior Functioning  Home Living Lives With: Spouse Available Help at Discharge: Family;Available 24 hours/day Type of Home: House Home Access: Stairs to enter [employer] of Steps: 4 Entrance Stairs-Rails: Right;Left;Can reach both Home Layout: Two level;Bed/bath upstairs;1/2 bath on main level Alternate Level Stairs-Number of Steps: 13 Alternate Level Stairs-Rails: Left Bathroom Shower/Tub: Health visitor: Standard Bathroom Accessibility: Yes How Accessible: Accessible via walker Home Adaptive Equipment: None Prior Function Level of Independence: Independent Driving: Yes Vocation: Workers Chief Operating Officer: Expressive difficulties Dominant Hand: Right    Cognition  Overall Cognitive Status: Appears within functional limits for tasks assessed/performed Arousal/Alertness: Awake/alert Orientation Level: Appears intact for tasks assessed Behavior During Session: [facility] for tasks performed    Extremity/Trunk Assessment Right Upper Extremity Assessment RUE ROM/Strength/Tone: Within functional levels RUE Sensation: WFL - Light Touch;WFL - Proprioception Left Upper Extremity Assessment LUE ROM/Strength/Tone: Within functional levels LUE Sensation: WFL - Light Touch;WFL - Proprioception Right Lower Extremity Assessment RLE ROM/Strength/Tone: Within functional levels RLE Sensation: WFL - Light Touch Left Lower Extremity Assessment LLE ROM/Strength/Tone: Within functional levels LLE Sensation: WFL - Light Touch   Mobility Bed Mobility Bed Mobility: Supine to  Sit;Sitting - Scoot to Edge of Bed Supine to Sit: 6: Modified independent (Device/Increase time) Sitting - Scoot to Edge of Bed: 6: Modified independent (Device/Increase time) Sit to Supine:  6: Modified independent (Device/Increase time)   Exercise    Balance Balance Balance Assessed: Yes Static Sitting Balance Static Sitting - Balance Support: Bilateral upper extremity supported;Feet supported Static Sitting - Level of Assistance: 7: Independent Static Sitting - Comment/# of Minutes: pt able to sit at EOB for 3 minutes with no assistance. Upon sitting, BP taken: 181/84 and pt not feeling well, requesting to lay back down.   End of Session OT - End of Session Activity Tolerance: Treatment limited secondary to medical complications (Comment);Other (comment) (high BP, not feeling well) Patient left: in bed;with call bell/phone within reach;with bed alarm set;with family/visitor present Nurse Communication: Other (comment) (BP reading)  GO    05/16/2012 Cipriano Mile OTR/L Pager [phone] Office [phone]  Cipriano Mile 05/16/2012, 4:50 PM

## 2012-05-16 NOTE — Progress Notes (Signed)
SUBJECTIVE  Dakota Hall is a 61 y.o. male who presented  withsudden onset dizziness,perioral numbness and horizontal diplopia at 1 pm  On 05/15/12 while working in yard. Wife noticed slurred speech as well. EMS reported elevated BP. He is feeling better now but dysarthria and diplopia persist.Ct head shows no acute abnormality only mild small vessel disease and generalized atrophy.. H/O similar episode 1 month ago at work without diplopia resolved in 1 hour did not seek medical help. He has been noncompliant with his medicines and not taken them for 2 weeks except depression pills twice last week only.No known h/o documented stroke or TIA.  He has residual symptoms of diplopia Overall he feels his condition is improving and speech is clear now. OBJECTIVE Most recent Vital Signs: Temp: 98.3 F (36.8 C) (06/30 0740) Temp src: Oral (06/30 0740) BP: 192/98 mmHg (06/30 0723) Pulse Rate: 56  (06/30 0723) Respiratory Rate: 21 O2 Saturdation: 99%  CBG (last 3)   Basename 05/16/12 0750 05/15/12 2220 05/15/12 1814  GLUCAP 266* 256* 295*   Intake/Output from previous day: 06/29 0701 - 06/30 0700 In: -  Out: 600 [Urine:600]  IV Fluid Intake:     . DISCONTD: sodium chloride 100 mL/hr (05/15/12 1841)   Diet:  NPO    Activity:  bedrest  DVT Prophylaxis:  SCDs  Studies: CBC    Component Value Date/Time   WBC 10.9* 05/15/2012 1815   RBC 5.41 05/15/2012 1815   HGB 17.3* 05/15/2012 1827   HCT 51.0 05/15/2012 1827   PLT 196 05/15/2012 1815   MCV 87.1 05/15/2012 1815   MCH 31.2 05/15/2012 1815   MCHC 35.9 05/15/2012 1815   RDW 12.6 05/15/2012 1815   LYMPHSABS 2.1 05/15/2012 1815   MONOABS 0.4 05/15/2012 1815   EOSABS 0.0 05/15/2012 1815   BASOSABS 0.0 05/15/2012 1815   CMP    Component Value Date/Time   NA 141 05/15/2012 1827   K 4.2 05/15/2012 1827   CL 104 05/15/2012 1827   CO2 25 05/15/2012 1815   GLUCOSE 298* 05/15/2012 1827   BUN 10 05/15/2012 1827   CREATININE 0.90 05/15/2012 1827   CALCIUM 9.9 05/15/2012 1815   PROT 7.8 05/15/2012 1815   ALBUMIN 4.2 05/15/2012 1815   AST 29 05/15/2012 1815   ALT 40 05/15/2012 1815   ALKPHOS 90 05/15/2012 1815   BILITOT 0.5 05/15/2012 1815   GFRNONAA 89* 05/15/2012 1815   GFRAA >90 05/15/2012 1815   COAGS Lab Results  Component Value Date   INR 1.03 05/15/2012   Lipid Panel    Component Value Date/Time   CHOL 202* 05/16/2012 0435   TRIG 86 05/16/2012 0435   HDL 56 05/16/2012 0435   CHOLHDL 3.6 05/16/2012 0435   VLDL 17 05/16/2012 0435   LDLCALC 129* 05/16/2012 0435   HgbA1C  Lab Results  Component Value Date   HGBA1C 10.8* 05/16/2012   Urine Drug Screen    Component Value Date/Time   LABOPIA NONE DETECTED 05/15/2012 2245   COCAINSCRNUR NONE DETECTED 05/15/2012 2245   LABBENZ NONE DETECTED 05/15/2012 2245   AMPHETMU NONE DETECTED 05/15/2012 2245   THCU NONE DETECTED 05/15/2012 2245   LABBARB NONE DETECTED 05/15/2012 2245    Alcohol Level No results found for this basename: eth     Dg Chest 2 View  05/16/2012  *RADIOLOGY REPORT*  Clinical Data: Nausea, diabetes  CHEST - 2 VIEW  Comparison: 08/09/2007  Findings: Mild cardiomegaly.  Low lung volumes with resultant crowding of bronchovascular  structures.  No confluent airspace infiltrate or overt edema.  No definite effusion.  Mildly tortuous thoracic aorta.  IMPRESSION:  1.  Mild cardiomegaly.  Low lung volumes.  Original Report Authenticated By: Osa Craver, M.D.   Ct Head Wo Contrast  05/15/2012  *RADIOLOGY REPORT*  Clinical Data:  Slurred speech, blurred vision  CT HEAD WITHOUT CONTRAST  Technique:  Contiguous axial images were obtained from the base of the skull through the vertex without contrast.  Comparison: None  Findings: Mild atrophy. Normal ventricular morphology. No midline shift or mass effect. Extensive small vessel chronic ischemic changes of deep cerebral white matter. Arachnoid granulations identified at the sigmoid sinuses bilaterally. No intracranial hemorrhage,  mass lesion, or evidence of acute infarction. No extra-axial fluid collections. Visualized paranasal sinuses and mastoid air cells clear. No acute osseous findings.  IMPRESSION: Atrophy with extensive small vessel chronic ischemic changes of deep cerebral white matter. No acute intracranial abnormalities.  Findings discussed with Dr. Karma Ganja prior to dictation of this report.  Original Report Authenticated By: Lollie Marrow, M.D.      MRI of the brain:  pending  MRA of the brain:  pending  2D Echocardiogram:  pending  Carotid Doppler:  pending        Physical Exam:    Awake alert.Obese Caucasian male. Afebrile. Head is nontraumatic. Neck is supple without bruit. Hearing is normal. Cardiac exam no murmur or gallop. Lungs are clear to auscultation. Distal pulses are well felt. No pedal edema.  Neurological Exam : Awake Alert oriented x 3  exam. Mild dysarthric speech but normal language.eye movements now without left internuclear opthalmoplegia with left eye horizontal gaze slow but full range and good vertical movements. Face symmetric.Good cough and gag. Tongue midline.  Normal strength, tone, reflexes and coordination. Normal sensation. Gait deferred.     ASSESSMENT [name] is a 61 y.o. male with a left paramedian pontine infarct, secondary to small vessel disease, on no medicines for secondary stroke prevention.  Stroke risk factors:  diabetes mellitus,HT,Obesity  Hospital day # 1  TREATMENT/PLAN Continue aspirin 325 mg orally every day for stroke prevention. And finish stroke work up. Mobilize out of bed. PT,OT consults.Add statin for elevated lipids.Start diet Gates Rigg, MD [facility] Pager: [phone] 05/16/2012 11:23 AM

## 2012-05-16 NOTE — Progress Notes (Signed)
[facility] [place] TEAM 1 - Stepdown/ICU TEAM  PCP:  No primary provider on file.  Subjective: 61 yo male h/o htn, dm off bp meds for over 2 weeks comes in with sudden onset of dizziness, double vision, slurred speech with perioral numbness and tongue numbness. Symptoms improved in the ED. Ct head revealed no acute abnormality - only mild small vessel disease and generalized atrophy.    Pt reports continued diplopia as well as continued slurred speech.  He denies cp, sob, f/c, n/v, or abdom pain.  He admits to poor compliance with this outpt meds.  Objective:  Intake/Output Summary (Last 24 hours) at 05/16/12 1617 Last data filed at 05/16/12 1541  Gross per 24 hour  Intake    360 ml  Output   1200 ml  Net   -840 ml   Blood pressure 159/78, pulse 56, temperature 98.5 F (36.9 C), temperature source Oral, resp. rate 23, height 5\' 8"  (1.727 m), weight 105.6 kg (232 lb 12.9 oz), SpO2 98.00%.  CBG (last 3)   Basename 05/16/12 1150 05/16/12 0750 05/15/12 2220  GLUCAP 230* 266* 256*   Physical Exam: General: No acute respiratory distress Lungs: Clear to auscultation bilaterally without wheezes or crackles Cardiovascular: Regular rate and rhythm without murmur gallop or rub normal S1 and S2 Abdomen: obese, nontender, nondistended, soft, bowel sounds positive, no rebound, no ascites, no appreciable mass Extremities: No significant cyanosis, clubbing, or edema bilateral lower extremities  Lab Results:  [facility] 05/15/12 1827 05/15/12 1815  NA 141 141  K 4.2 4.3  CL 104 102  CO2 -- 25  GLUCOSE 298* 306*  BUN 10 11  CREATININE 0.90 0.93  CALCIUM -- 9.9  MG -- --  PHOS -- --    Basename 05/15/12 1815  AST 29  ALT 40  ALKPHOS 90  BILITOT 0.5  PROT 7.8  ALBUMIN 4.2    Basename 05/15/12 1827 05/15/12 1815  WBC -- 10.9*  NEUTROABS -- 8.5*  HGB 17.3* 16.9  HCT 51.0 47.1  MCV -- 87.1  PLT -- 196    Basename 05/15/12 1815  CKTOTAL 109  CKMB 2.3  CKMBINDEX  --  TROPONINI <0.30    Basename 05/16/12 0415  HGBA1C 10.8*    Micro Results: Recent Results (from the past 240 hour(s))  MRSA PCR SCREENING     Status: Normal   Collection Time   05/15/12  9:39 PM      Component Value Range Status Comment   MRSA by PCR NEGATIVE  NEGATIVE Final     Studies/Results: All recent x-ray/radiology reports have been reviewed in detail.   Medications: I have reviewed the patient's complete medication list.  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, L dorsal pons, R parietal subcortical) MRI reveals the acute lesions - no focal stenosis via MRA - passed RN swallow screnn - no eviidence of ICA stenosis on doppler - on ASA 325 for prophylaxis - Echo accomplished but not yet read - may require TEE given anatomical distribution suggestive of embolic phenom - will discuss further w/ Stroke Team - cont PT/OT   DM Poorly controlled (A1c 10.8) - has been noncompliant w/ meds - adjust tx plan - educate again on diet - follow CBG - ASA + ACE or ARB  Hyperlipidemia LDL 129 in setting of DM and acute CVA - tx is indicated   HTN Reasonably controlled in setting of acute CVA - will begin to slowly titrate meds in AM  Obesity  Counseled on need for weight loss to maximize control of medical illnesses  Dispo Plan for transfer to unit 3000 in AM  Lonia Blood, MD [facility] Office  [phone] Pager [phone]  On-Call/Text Page:      [email or web address]      password [facility]

## 2012-05-17 DIAGNOSIS — I639 Cerebral infarction, unspecified: Secondary | ICD-10-CM

## 2012-05-17 DIAGNOSIS — R4789 Other speech disturbances: Secondary | ICD-10-CM

## 2012-05-17 DIAGNOSIS — I6330 Cerebral infarction due to thrombosis of unspecified cerebral artery: Secondary | ICD-10-CM

## 2012-05-17 DIAGNOSIS — E119 Type 2 diabetes mellitus without complications: Secondary | ICD-10-CM

## 2012-05-17 DIAGNOSIS — H539 Unspecified visual disturbance: Secondary | ICD-10-CM

## 2012-05-17 DIAGNOSIS — I10 Essential (primary) hypertension: Secondary | ICD-10-CM

## 2012-05-17 HISTORY — DX: Cerebral infarction, unspecified: I63.9

## 2012-05-17 LAB — CBC
Hemoglobin: 16.3 g/dL (ref 13.0–17.0)
MCH: 30.6 pg (ref 26.0–34.0)
MCV: 86.5 fL (ref 78.0–100.0)
RBC: 5.32 MIL/uL (ref 4.22–5.81)

## 2012-05-17 LAB — GLUCOSE, CAPILLARY: Glucose-Capillary: 237 mg/dL — ABNORMAL HIGH (ref 70–99)

## 2012-05-17 MED ORDER — ACETAMINOPHEN 325 MG PO TABS: 650.0000 mg | ORAL_TABLET | Freq: Four times a day (QID) | ORAL | Status: DC | PRN

## 2012-05-17 MED ORDER — CARVEDILOL 3.125 MG PO TABS
3.1250 mg | ORAL_TABLET | Freq: Two times a day (BID) | ORAL | Status: DC
  Administered 2012-05-17 – 2012-05-18 (×2): 3.125 mg via ORAL
  Filled 2012-05-17 (×4): qty 1

## 2012-05-17 MED ORDER — METFORMIN HCL ER 500 MG PO TB24
1000.0000 mg | ORAL_TABLET | Freq: Every day | ORAL | Status: DC
  Filled 2012-05-17: qty 2

## 2012-05-17 MED ORDER — IRBESARTAN 150 MG PO TABS
150.0000 mg | ORAL_TABLET | Freq: Every day | ORAL | Status: DC
  Administered 2012-05-18: 150 mg via ORAL
  Filled 2012-05-17: qty 1

## 2012-05-17 MED ORDER — SIMVASTATIN 20 MG PO TABS
20.0000 mg | ORAL_TABLET | Freq: Every day | ORAL | Status: DC
  Administered 2012-05-17: 20 mg via ORAL
  Filled 2012-05-17 (×2): qty 1

## 2012-05-17 MED ORDER — VALSARTAN 160 MG PO TABS: 160.0000 mg | ORAL_TABLET | Freq: Every day | ORAL | Status: DC

## 2012-05-17 MED ORDER — HYDROCHLOROTHIAZIDE 12.5 MG PO CAPS
12.5000 mg | ORAL_CAPSULE | Freq: Every day | ORAL | Status: DC
  Administered 2012-05-18: 12.5 mg via ORAL
  Filled 2012-05-17: qty 1

## 2012-05-17 MED ORDER — INSULIN GLARGINE 100 UNIT/ML ~~LOC~~ SOLN
20.0000 [IU] | Freq: Every day | SUBCUTANEOUS | Status: DC
  Administered 2012-05-17: 20 [IU] via SUBCUTANEOUS

## 2012-05-17 MED ORDER — METFORMIN HCL ER 500 MG PO TB24
1000.0000 mg | ORAL_TABLET | Freq: Two times a day (BID) | ORAL | Status: DC
  Administered 2012-05-17 – 2012-05-18 (×2): 1000 mg via ORAL
  Filled 2012-05-17 (×5): qty 2

## 2012-05-17 NOTE — Progress Notes (Signed)
Physical Therapy Treatment Patient Details Name: Dakota Hall MRN: [id number] DOB: 01/24/1951 Today's Date: 05/17/2012 Time: 2841-3244 PT Time Calculation (min): 19 min  PT Assessment / Plan / Recommendation Comments on Treatment Session  pt presents with CVA.  pt biggest deficit is Diplopia.  OT ordered eye patch for pt.  pt's BP 150/73 supine, 165/84 sitting, and 155/78 standing.      Follow Up Recommendations  Inpatient Rehab    Barriers to Discharge        Equipment Recommendations  None recommended by PT    Recommendations for Other Services    Frequency Min 4X/week   Plan Discharge plan needs to be updated;Frequency remains appropriate    Precautions / Restrictions Precautions Precautions: Fall Precaution Comments: Diplopia Restrictions Weight Bearing Restrictions: No   Pertinent Vitals/Pain Denies pain, but notes nausea.      Mobility  Bed Mobility Bed Mobility: Supine to Sit;Sitting - Scoot to Edge of Bed Supine to Sit: 6: Modified independent (Device/Increase time);HOB elevated Sitting - Scoot to Edge of Bed: 6: Modified independent (Device/Increase time) Details for Bed Mobility Assistance: pt used rail to complete Independently.   Transfers Transfers: Sit to Stand;Stand to [employer] Transfers Sit to Stand: 4: Min assist;From bed;With upper extremity assist Stand to Sit: 4: Min guard;To chair/3-in-1;With upper extremity assist;With armrests Stand Pivot Transfers: 4: Min assist Details for Transfer Assistance: Pt. required Min A with sit to stand transfer to help correct balance only. No lifting assist needed. Ambulation/Gait Ambulation/Gait Assistance: Not tested (comment) Stairs: No Wheelchair Mobility Wheelchair Mobility: No Modified Rankin (Stroke Patients Only) Pre-Morbid Rankin Score: No symptoms Modified Rankin: Moderately severe disability    Exercises     PT Diagnosis:    PT Problem List:   PT Treatment Interventions:     PT Goals Acute  Rehab PT Goals Time For Goal Achievement: 05/23/12 PT Goal: Sit to Stand - Progress: Progressing toward goal PT Goal: Stand to Sit - Progress: Progressing toward goal PT Transfer Goal: Bed to Chair/Chair to Bed - Progress: Progressing toward goal PT Goal: Ambulate - Progress: Progressing toward goal  Visit Information  Last PT Received On: 05/17/12 Assistance Needed: +1 PT/OT Co-Evaluation/Treatment: Yes    Subjective Data  Subjective: pt c/o diplopia and some nausea.     Cognition  Overall Cognitive Status: Appears within functional limits for tasks assessed/performed Arousal/Alertness: Awake/alert Orientation Level: Appears intact for tasks assessed Behavior During Session: [facility] for tasks performed    Balance  Balance Balance Assessed: Yes Static Standing Balance Static Standing - Balance Support: Bilateral upper extremity supported Static Standing - Level of Assistance: 5: Stand by assistance Static Standing - Comment/# of Minutes: pt notes feeling off balance secondary to Diplopia, OT applied gauze eye patch and ordered one from supply for pt.    End of Session PT - End of Session Equipment Utilized During Treatment: Gait belt (Gauze over R eye) Activity Tolerance: Patient tolerated treatment well Patient left: in chair;with call bell/phone within reach;with family/visitor present Nurse Communication: Mobility status   GP     Sunny Schlein, [place] [phone] 05/17/2012, 12:24 PM

## 2012-05-17 NOTE — Progress Notes (Signed)
Inpatient Diabetes Program Recommendations  AACE/ADA: New Consensus Statement on Inpatient Glycemic Control (2009)  Target Ranges:  Prepandial:   less than 140 mg/dL      Peak postprandial:   less than 180 mg/dL (1-2 hours)      Critically ill patients:  140 - 180 mg/dL   Reason for Visit: Hyperglycemia on regular diet   Inpatient Diabetes Program Recommendations Diet: Please add carbohydrate modified to diet orders.  Note: If patient to go home on insulin, pt will need order for insulin administration teaching at discharge per bedside RN. Thank you, Lenor Coffin, RN, CNS, Diabetes Coordinator [phone])

## 2012-05-17 NOTE — Progress Notes (Signed)
UR Completed. Cammy Sanjurjo, RN, Nurse Case Manager [phone]     

## 2012-05-17 NOTE — Plan of Care (Signed)
Problem: Food- and Nutrition-Related Knowledge Deficit (NB-1.1) Goal: Nutrition education Formal process to instruct or train a patient/client in a skill or to impart knowledge to help patients/clients voluntarily manage or modify food choices and eating behavior to maintain or improve health.  Outcome: Progressing Nutrition Education Note  RD consulted for nutrition education regarding diabetes.     Lab Results  Component Value Date    HGBA1C 10.8* 05/16/2012    RD provided "Carbohydrate Counting for Diabetes" handout from the [employer] of [employer]. Pt not interested in DM education at this time, however willing to accept handout with RD contact information and contact information for the [facility] for future education needs.  RD instructed pt and family on how to contact RD during hospitalization in the event he felt more agreeable to education. Discussed importance of controlled and consistent carbohydrate intake throughout the day, and the benefits of controlling blood sugars in the future.   Expect good compliance.  Body mass index is 35.40 kg/(m^2). Pt meets criteria for obesity based on current BMI.  Current diet order is CHO/Heart Healthy, patient is consuming approximately 50-100% of meals at this time. Labs and medications reviewed. No further nutrition interventions warranted at this time. RD contact information provided. If additional nutrition issues arise, please re-consult RD.  Dakota Hall Pager: [phone]

## 2012-05-17 NOTE — Consult Note (Signed)
Physical Medicine and Rehabilitation Consult Reason for Consult: Left pontine thrombotic infarct Referring Physician: Dr. Pearlean Brownie   HPI: Dakota Hall is a 61 y.o. right-handed male with history of hypertension diabetes mellitus admitted 05/15/2012 with slurred speech as well as dizziness with blurred vision. MRI of the brain showed a 1.2 cm acute infarct within the left ventral pons and right parietal subcortical white matter. MRA of the head with no major occlusion or stenosis. Echocardiogram with ejection fraction 65% and grade 1 diastolic dysfunction. Carotid Dopplers no evidence of ICA stenosis. Neurology consulted placed on aspirin therapy. Noted history of diabetes mellitus with hemoglobin A1c of 10.8 and placed on Lantus insulin. Physical and occupational therapy ongoing. M.D. is requested physical medicine rehabilitation consult consider inpatient rehabilitation services  Review of Systems  Eyes: Positive for double vision.  Neurological: Positive for dizziness and headaches.  Psychiatric/Behavioral: Positive for depression.  All other systems reviewed and are negative.   Past Medical History  Diagnosis Date  . Diabetes mellitus   . Hypertension    Past Surgical History  Procedure Date  . Tonsillectomy   . Anal fissure repair    History reviewed. No pertinent family history. Social History:  reports that he has never smoked. He does not have any smokeless tobacco history on file. He reports that he does not drink alcohol or use illicit drugs. Allergies:  Allergies  Allergen Reactions  . Penicillins Hives   Medications Prior to Admission  Medication Sig Dispense Refill  . amLODipine (NORVASC) 10 MG tablet Take 10 mg by mouth daily.      . busPIRone (BUSPAR) 15 MG tablet Take 15 mg by mouth 3 (three) times daily.      . metFORMIN (GLUCOPHAGE-XR) 500 MG 24 hr tablet Take 1,000 mg by mouth daily with breakfast.      . pioglitazone (ACTOS) 15 MG tablet Take 15 mg by mouth  daily.      . sertraline (ZOLOFT) 100 MG tablet Take 150 mg by mouth daily.      . traZODone (DESYREL) 50 MG tablet Take 50-100 mg by mouth daily as needed. For sleep      . valsartan-hydrochlorothiazide (DIOVAN-HCT) 160-12.5 MG per tablet Take 1 tablet by mouth 2 (two) times daily.        Home: Home Living Lives With: Spouse Available Help at Discharge: Family;Available 24 hours/day Type of Home: House Home Access: Stairs to enter [employer] of Steps: 4 Entrance Stairs-Rails: Right;Left;Can reach both Home Layout: Two level;Bed/bath upstairs;1/2 bath on main level Alternate Level Stairs-Number of Steps: 13 Alternate Level Stairs-Rails: Left Bathroom Shower/Tub: Health visitor: Standard Bathroom Accessibility: Yes How Accessible: Accessible via walker Home Adaptive Equipment: None  Functional History: Prior Function Driving: Yes Vocation: Workers comp Functional Status:  Mobility: Bed Mobility Bed Mobility: Supine to Sit;Sitting - Scoot to Edge of Bed Supine to Sit: 6: Modified independent (Device/Increase time);HOB elevated Sitting - Scoot to Edge of Bed: 6: Modified independent (Device/Increase time) Sit to Supine: 6: Modified independent (Device/Increase time) Transfers Transfers: Not assessed Sit to Stand: 4: Min assist;From bed Stand to Sit: 4: Min guard;To chair/3-in-1;With upper extremity assist      ADL: ADL Eating/Feeding: Performed;Independent Where Assessed - Eating/Feeding: Chair Upper Body Dressing: Performed;Independent Where Assessed - Upper Body Dressing: Unsupported sitting Lower Body Dressing: Performed;Independent Where Assessed - Lower Body Dressing: Unsupported sitting Toilet Transfer: Simulated;Minimal assistance Toilet Transfer Method: Sit to stand;Stand pivot Toilet Transfer Equipment: Regular height toilet Transfers/Ambulation Related to ADLs:  Not attempted due to pt with high BP and not feeling well ADL  Comments: Pt. was independent in donning socks while sitting EOB. Pt. c/o of diplopia and became tearful at end of session. OT patched pt.'s eye to help ease his diplopia. Pt. reported that the patch helped. OT educated RN Producer, television/film/video) on eye patch home exercise program for pt. Pt.in chair at end of session and was eating breakfast independently.  Cognition: Cognition Arousal/Alertness: Awake/alert Orientation Level: Oriented X4 Cognition Overall Cognitive Status: Appears within functional limits for tasks assessed/performed Arousal/Alertness: Awake/alert Orientation Level: Appears intact for tasks assessed Behavior During Session: [facility] for tasks performed  Blood pressure 155/78, pulse 58, temperature 98.3 F (36.8 C), temperature source Oral, resp. rate 19, height 5\' 8"  (1.727 m), weight 105.6 kg (232 lb 12.9 oz), SpO2 97.00%. Physical Exam  Constitutional: He is oriented to person, place, and time. He appears well-developed.  HENT:  Head: Normocephalic and atraumatic.  Right Ear: External ear normal.  Left Ear: External ear normal.  Nose: Nose normal.  Mouth/Throat: Oropharynx is clear and moist.  Eyes: Conjunctivae are normal. Pupils are equal, round, and reactive to light. Right eye exhibits no discharge. Left eye exhibits no discharge.  Neck: Neck supple. No thyromegaly present.  Cardiovascular: Normal rate and regular rhythm.   Pulmonary/Chest: Effort normal and breath sounds normal. No respiratory distress. He has no wheezes.  Abdominal: Soft. Bowel sounds are normal. He exhibits no distension. There is no tenderness.  Musculoskeletal: He exhibits no edema.  Neurological: He is alert and oriented to person, place, and time.       Right eye patch in place. Patient follows commands without difficulty and speech is dysarthric. He has double vision especially with gaze to the right and there appeared to be right lateral rectus weakness. Tongue is midline. Strength and sensation are  normal  and symmetrical. Cognitively he is generally intact.  Skin: Skin is warm and dry.  Psychiatric: He has a normal mood and affect. His behavior is normal. Judgment and thought content normal.    Results for orders placed during the hospital encounter of 05/15/12 (from the past 24 hour(s))  GLUCOSE, CAPILLARY     Status: Abnormal   Collection Time   05/16/12  5:13 PM      Component Value Range   Glucose-Capillary 223 (*) 70 - 99 mg/dL   Comment 1 Notify RN    GLUCOSE, CAPILLARY     Status: Abnormal   Collection Time   05/16/12  9:42 PM      Component Value Range   Glucose-Capillary 267 (*) 70 - 99 mg/dL   Comment 1 Notify RN     Comment 2 Documented in Chart    CBC     Status: Abnormal   Collection Time   05/17/12  4:15 AM      Component Value Range   WBC 11.9 (*) 4.0 - 10.5 K/uL   RBC 5.32  4.22 - 5.81 MIL/uL   Hemoglobin 16.3  13.0 - 17.0 g/dL   HCT 16.1  09.6 - 04.5 %   MCV 86.5  78.0 - 100.0 fL   MCH 30.6  26.0 - 34.0 pg   MCHC 35.4  30.0 - 36.0 g/dL   RDW 40.9  81.1 - 91.4 %   Platelets 196  150 - 400 K/uL  GLUCOSE, CAPILLARY     Status: Abnormal   Collection Time   05/17/12  7:48 AM      Component Value  Range   Glucose-Capillary 237 (*) 70 - 99 mg/dL   Comment 1 Notify RN     Comment 2 Documented in Chart     Dg Chest 2 View  05/16/2012  *RADIOLOGY REPORT*  Clinical Data: Nausea, diabetes  CHEST - 2 VIEW  Comparison: 08/09/2007  Findings: Mild cardiomegaly.  Low lung volumes with resultant crowding of bronchovascular structures.  No confluent airspace infiltrate or overt edema.  No definite effusion.  Mildly tortuous thoracic aorta.  IMPRESSION:  1.  Mild cardiomegaly.  Low lung volumes.  Original Report Authenticated By: Osa Craver, M.D.   Ct Head Wo Contrast  05/15/2012  *RADIOLOGY REPORT*  Clinical Data:  Slurred speech, blurred vision  CT HEAD WITHOUT CONTRAST  Technique:  Contiguous axial images were obtained from the base of the skull through the vertex  without contrast.  Comparison: None  Findings: Mild atrophy. Normal ventricular morphology. No midline shift or mass effect. Extensive small vessel chronic ischemic changes of deep cerebral white matter. Arachnoid granulations identified at the sigmoid sinuses bilaterally. No intracranial hemorrhage, mass lesion, or evidence of acute infarction. No extra-axial fluid collections. Visualized paranasal sinuses and mastoid air cells clear. No acute osseous findings.  IMPRESSION: Atrophy with extensive small vessel chronic ischemic changes of deep cerebral white matter. No acute intracranial abnormalities.  Findings discussed with Dr. Karma Ganja prior to dictation of this report.  Original Report Authenticated By: Lollie Marrow, M.D.   Mr Brain Wo Contrast  05/16/2012  *RADIOLOGY REPORT*  Clinical Data:  Slurred speech.  Hypertension.  Dizziness.  Visual disturbance.  MRI HEAD WITHOUT CONTRAST MRA HEAD WITHOUT CONTRAST  Technique:  Multiplanar, multiecho pulse sequences of the brain and surrounding structures were obtained without intravenous contrast. Angiographic images of the head were obtained using MRA technique without contrast.  Comparison:  Head CT 06/29.  MRI 01/16/2009  MRI HEAD  Findings:  Diffusion imaging shows foci of acute infarction affecting the left side of the pons.  The largest area is a 1.2 cm region in the ventral left pons.  There is a 5 mm focus in the dorsal left pons extending to the floor of the fourth ventricle. There is a 3-4 mm acute infarction within the right parietal subcortical white matter.  No other acute infarction.  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  The areas of infarction do not show hemorrhage.  Elsewhere, there are chronic small vessel changes within the pons. No focal cerebellar insult.  The cerebral hemispheres show extensive chronic small vessel changes affecting the deep and subcortical white matter.  No large vessel territory infarction. No  mass lesion, hemorrhage, hydrocephalus or extra-axial collection.  No pituitary mass.  No inflammatory sinus disease.  IMPRESSION: 1.2 cm acute infarction within the left ventral pons.  5 mm acute infarction within the dorsal left pons.  The 3-4 mm acute infarction within the right parietal subcortical white matter. 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  No hemorrhage.  Extensive chronic small vessel changes elsewhere throughout the brain.  MRA HEAD  Findings: Both internal carotid arteries are widely patent into the brain.  No siphon narrowing or irregularity.  The anterior and middle cerebral vessels are patent without proximal stenosis, aneurysm or vascular malformation.  More distal branch vessels shows some atherosclerotic irregularity.  Both vertebral arteries are widely patent to the basilar.  There is mild atherosclerotic irregularity of the distal left vertebral artery but no flow- limiting stenosis.  The basilar artery shows mild irregularity but  no stenosis.  Posterior circulation branch vessels are patent, with mild atherosclerotic irregularity of the distal branches.  IMPRESSION:  No major vessel occlusion or correctable proximal stenosis.  Distal vessel atherosclerotic changes diffusely.  Original Report Authenticated By: Thomasenia Sales, M.D.   Mr Mra Head/brain Wo Cm  05/16/2012  *RADIOLOGY REPORT*  Clinical Data:  Slurred speech.  Hypertension.  Dizziness.  Visual disturbance.  MRI HEAD WITHOUT CONTRAST MRA HEAD WITHOUT CONTRAST  Technique:  Multiplanar, multiecho pulse sequences of the brain and surrounding structures were obtained without intravenous contrast. Angiographic images of the head were obtained using MRA technique without contrast.  Comparison:  Head CT 06/29.  MRI 01/16/2009  MRI HEAD  Findings:  Diffusion imaging shows foci of acute infarction affecting the left side of the pons.  The largest area is a 1.2 cm region in the ventral left pons.  There is a 5 mm focus in the  dorsal left pons extending to the floor of the fourth ventricle. There is a 3-4 mm acute infarction within the right parietal subcortical white matter.  No other acute infarction.  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  The areas of infarction do not show hemorrhage.  Elsewhere, there are chronic small vessel changes within the pons. No focal cerebellar insult.  The cerebral hemispheres show extensive chronic small vessel changes affecting the deep and subcortical white matter.  No large vessel territory infarction. No mass lesion, hemorrhage, hydrocephalus or extra-axial collection.  No pituitary mass.  No inflammatory sinus disease.  IMPRESSION: 1.2 cm acute infarction within the left ventral pons.  5 mm acute infarction within the dorsal left pons.  The 3-4 mm acute infarction within the right parietal subcortical white matter. 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  No hemorrhage.  Extensive chronic small vessel changes elsewhere throughout the brain.  MRA HEAD  Findings: Both internal carotid arteries are widely patent into the brain.  No siphon narrowing or irregularity.  The anterior and middle cerebral vessels are patent without proximal stenosis, aneurysm or vascular malformation.  More distal branch vessels shows some atherosclerotic irregularity.  Both vertebral arteries are widely patent to the basilar.  There is mild atherosclerotic irregularity of the distal left vertebral artery but no flow- limiting stenosis.  The basilar artery shows mild irregularity but no stenosis.  Posterior circulation branch vessels are patent, with mild atherosclerotic irregularity of the distal branches.  IMPRESSION:  No major vessel occlusion or correctable proximal stenosis.  Distal vessel atherosclerotic changes diffusely.  Original Report Authenticated By: Thomasenia Sales, M.D.    Assessment/Plan: Diagnosis: Left pontine and right parietal infarcts 1. Does the need for close, 24  hr/day medical supervision in concert with the patient's rehab needs make it unreasonable for this patient to be served in a less intensive setting? No 2. Co-Morbidities requiring supervision/potential complications: See above 3. Due to bladder management, bowel management, skin/wound care and medication administration, does the patient require 24 hr/day rehab nursing? No 4. Does the patient require coordinated care of a physician, rehab nurse, PT and OT to address physical and functional deficits in the context of the above medical diagnosis(es)? No Addressing deficits in the following areas: balance, endurance, grooming, toileting and speech 5. Can the patient actively participate in an intensive therapy program of at least 3 hrs of therapy per day at least 5 days per week? Yes 6. The potential for patient to make measurable gains while on inpatient rehab is fair 7. Anticipated functional outcomes upon  discharge from inpatient rehab are not app. 8. Estimated rehab length of stay to reach the above functional goals is: Not applicable 9. Does the patient have adequate social supports to accommodate these discharge functional goals? Potentially 10. Anticipated D/C setting: Home 11. Anticipated post D/C treatments: Outpt therapy 12. Overall Rehab/Functional Prognosis: excellent  RECOMMENDATIONS: This patient's condition is appropriate for continued rehabilitative care in the following setting: Outpt Patient has agreed to participate in recommended program. Yes Note that insurance prior authorization may be required for reimbursement for recommended care.  Comment: Recommend outpatient and  occupational therapy, possibly physical therapy. Continue eye patch use and potential followup with neuro-ophthalmology down the line pending neurological recovery.  Ranelle Oyster M.D.  05/17/2012

## 2012-05-17 NOTE — Progress Notes (Signed)
Eye Patch placed on right eye.

## 2012-05-17 NOTE — Progress Notes (Addendum)
[facility] [place] TEAM 1 - Stepdown/ICU TEAM  PCP:  No primary provider on file.  Subjective: 61 yo male h/o htn, dm off bp meds for over 2 weeks comes in with sudden onset of dizziness, double vision, slurred speech with perioral numbness and tongue numbness. Symptoms improved in the ED. Ct head revealed no acute abnormality - only mild small vessel disease and generalized atrophy.    The pt continues to experience some slurring of his speech as well some diplopia.  He denies any other focal neurologic problems.  He denies cp, sob, f/c, n/v, or abdom pain.   Objective:  Intake/Output Summary (Last 24 hours) at 05/17/12 1354 Last data filed at 05/17/12 1200  Gross per 24 hour  Intake    840 ml  Output   1200 ml  Net   -360 ml   Blood pressure 168/84, pulse 58, temperature 98.6 F (37 C), temperature source Oral, resp. rate 19, height 5\' 8"  (1.727 m), weight 105.6 kg (232 lb 12.9 oz), SpO2 93.00%.  CBG (last 3)   Basename 05/17/12 1159 05/17/12 0748 05/16/12 2142  GLUCAP 260* 237* 267*   Physical Exam: General: No acute respiratory distress - currently wearing patch on L eye  Lungs: Clear to auscultation bilaterally without wheezes or crackles Cardiovascular: Regular rate and rhythm without murmur gallop or rub  Abdomen: obese, nontender, nondistended, soft, bowel sounds positive, no rebound, no ascites, no appreciable mass Extremities: No significant cyanosis, clubbing, or edema bilateral lower extremities  Lab Results:  [facility] 05/15/12 1827 05/15/12 1815  NA 141 141  K 4.2 4.3  CL 104 102  CO2 -- 25  GLUCOSE 298* 306*  BUN 10 11  CREATININE 0.90 0.93  CALCIUM -- 9.9  MG -- --  PHOS -- --    Basename 05/15/12 1815  AST 29  ALT 40  ALKPHOS 90  BILITOT 0.5  PROT 7.8  ALBUMIN 4.2    Basename 05/17/12 0415 05/15/12 1827 05/15/12 1815  WBC 11.9* -- 10.9*  NEUTROABS -- -- 8.5*  HGB 16.3 17.3* 16.9  HCT 46.0 51.0 47.1  MCV 86.5 -- 87.1  PLT 196  -- 196    Basename 05/15/12 1815  CKTOTAL 109  CKMB 2.3  CKMBINDEX --  TROPONINI <0.30    Basename 05/16/12 0415  HGBA1C 10.8*   Micro Results: Recent Results (from the past 240 hour(s))  MRSA PCR SCREENING     Status: Normal   Collection Time   05/15/12  9:39 PM      Component Value Range Status Comment   MRSA by PCR NEGATIVE  NEGATIVE Final    Studies/Results: All recent x-ray/radiology reports have been reviewed in detail.   Medications: I have reviewed the patient's complete medication list.  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, L dorsal pons, R parietal subcortical) MRI reveals the acute lesions noted above - no focal stenosis via MRA - passed RN swallow screen - no evidence of ICA stenosis on doppler - TTE without worrisome findings - on ASA 325 for prophylaxis - discussed MRI results w/ Stroke Team who confirm that the pt likely had multifocal small vessel disease related CVAs, but state that the MRI findings are not c/w embolic CVAs - we cont w/ PT/OT efforts - awaiting eval by Rehab MD to consider CIR admission  grade 1 diastolic dysfunction Noted on TTE - no clinical sx at this time - BP control is key   DM Poorly controlled (A1c 10.8) -  has been noncompliant w/ meds - adjust tx plan again today - educate again on diet - follow CBG - ASA + ACE or ARB  Hyperlipidemia LDL 129 in setting of DM and acute CVA - tx is indicated - llipitor has been initiated - LFTs normal prior to initiation of tx  HTN Reasonably controlled in setting of acute CVA - is now at stage where we can afford more strict control - adjust tx plan and follow  Obesity Counseled on need for weight loss to maximize control of medical illnesses  Dispo Plan for transfer to unit 3000 - await CIR eval  Lonia Blood, MD [facility] Office  [phone] Pager [phone]  On-Call/Text Page:      [email or web address]      password [facility]

## 2012-05-17 NOTE — Progress Notes (Signed)
Occupational Therapy Treatment Patient Details Name: Dakota Hall MRN: [id number] DOB: [birth year] Today's Date: 05/17/2012 Time: 4098-1191 OT Time Calculation (min): 19 min  OT Assessment / Plan / Recommendation Comments on Treatment Session Pt. did well in session and had stable BP readings. Pt. was educated on eye patch exercise program and OT ordered  patch for pt. eye to ease diplopia.     Follow Up Recommendations  Other (comment) (TBD)    Barriers to Discharge       Equipment Recommendations  Other (comment) (TBD)    Recommendations for Other Services    Frequency Min 3X/week   Plan Discharge plan remains appropriate    Precautions / Restrictions Restrictions Weight Bearing Restrictions: No   Pertinent Vitals/Pain See vitals. No pain reported.     ADL  Eating/Feeding: Performed;Independent Where Assessed - Eating/Feeding: Chair Lower Body Dressing: Performed;Independent Where Assessed - Lower Body Dressing: Unsupported sitting Toilet Transfer: Simulated;Minimal assistance Toilet Transfer Method: Sit to stand;Stand pivot Toilet Transfer Equipment: Regular height toilet ADL Comments: Pt. was independent in donning socks while sitting EOB. Pt. c/o of diplopia and became tearful at end of session. OT patched pt.'s eye to help ease his diplopia. Pt provided temporary eye patch for session (soft patch). Patch removed at end of session and glasses taped on Lt side to occlude left eye. Eye patch ordered to unit and awaiting arrival to don on pt. Pt. reported that the patch helped. OT educated RN Producer, television/film/video) on eye patch home exercise program for pt. Pt.in chair at end of session and was eating breakfast independently.       OT Goals Acute Rehab OT Goals OT Goal Formulation: With patient Time For Goal Achievement: 05/23/12 Potential to Achieve Goals: Good ADL Goals Pt Will Transfer to Toilet: Independently;Ambulation;Comfort height toilet ADL Goal: Toilet Transfer - Progress:  Progressing toward goals Miscellaneous OT Goals Miscellaneous OT Goal #1: Pt will independently retrieve ADL items in prep for grooming at sink. Miscellaneous OT Goal #2: Pt will independently perform vision HEP. OT Goal: Miscellaneous Goal #2 - Progress: Progressing toward goals  Visit Information  Last OT Received On: 05/16/12 Assistance Needed: +1    Subjective Data   When standing from the bed, pt. stated, "My legs feel heavy."   Prior Functioning       Cognition  Overall Cognitive Status: Appears within functional limits for tasks assessed/performed Arousal/Alertness: Awake/alert Orientation Level: Appears intact for tasks assessed Behavior During Session: [facility] for tasks performed    Mobility Bed Mobility Bed Mobility: Supine to Sit;Sitting - Scoot to Edge of Bed Supine to Sit: 6: Modified independent (Device/Increase time);HOB elevated Sitting - Scoot to Edge of Bed: 6: Modified independent (Device/Increase time) Transfers Transfers: Sit to Stand;Stand to Sit Sit to Stand: 4: Min assist;From bed Stand to Sit: 4: Min guard;To chair/3-in-1;With upper extremity assist Details for Transfer Assistance: Pt. required Min A with sit to stand transfer to help correct balance only. No lifting assist needed.           End of Session OT - End of Session Activity Tolerance: Patient tolerated treatment well Patient left: in chair;with call bell/phone within reach;with family/visitor present Nurse Communication: Other (comment) (eye patch exercise program)  GO     Jenell Milliner 05/17/2012, 9:14 AM  [employer]   OTR/L Pager: [phone] Office: [phone] .

## 2012-05-17 NOTE — Progress Notes (Signed)
Stroke Team Progress Note  HISTORY Dakota Hall is a 61 y.o. male who presented with sudden onset dizziness, perioral numbness and horizontal diplopia at 1 pm On 05/15/12 while working in yard. Wife noticed slurred speech as well. EMS reported elevated BP. He is feeling better now but dysarthria and diplopia persist.Ct head shows no acute abnormality only mild small vessel disease and generalized atrophy.. H/O similar episode 1 month ago at work without diplopia resolved in 1 hour did not seek medical help. He has been noncompliant with his medicines and not taken them for 2 weeks except depression pills twice last week only.No known h/o documented stroke or TIA. Patient was not a TPA candidate secondary to presenting beyond the tPA window. He was admitted for further evaluation and treatment.  SUBJECTIVE His wife is at the bedside.  Overall he feels his condition is gradually improving. He now has a pirate patch to help with diplopia.  OBJECTIVE Most recent Vital Signs: Filed Vitals:   05/16/12 2250 05/17/12 0028 05/17/12 0416 05/17/12 0744  BP: 153/79 151/73 150/82   Pulse:  54 58   Temp:  98.6 F (37 C) 98.5 F (36.9 C) 98.3 F (36.8 C)  TempSrc:  Oral Oral Oral  Resp: 25 18 19    Height:      Weight:      SpO2:  97% 96%    CBG (last 3)  Basename 05/16/12 2142 05/16/12 1713 05/16/12 1150  GLUCAP 267* 223* 230*   Intake/Output from previous day: 06/30 0701 - 07/01 0700 In: 600 [P.O.:600] Out: 1200 [Urine:1200]  IV Fluid Intake:     MEDICATIONS    . amLODipine  10 mg Oral Daily  . antiseptic oral rinse  15 mL Mouth Rinse BID  . aspirin  325 mg Oral Daily  . busPIRone  15 mg Oral TID  . valsartan  160 mg Oral BID   And  . hydrochlorothiazide  12.5 mg Oral BID  . insulin aspart  0-20 Units Subcutaneous TID WC  . insulin aspart  0-5 Units Subcutaneous QHS  . insulin glargine  10 Units Subcutaneous QHS  . pioglitazone  15 mg Oral Daily  . sertraline  150 mg Oral Daily  .  DISCONTD: aspirin  300 mg Rectal Daily  . DISCONTD: carvedilol  3.125 mg Oral BID WC  . DISCONTD: insulin aspart  0-9 Units Subcutaneous TID WC  . DISCONTD: sodium chloride  3 mL Intravenous Q12H   PRN:  acetaminophen, hydrALAZINE, morphine injection, oxyCODONE, senna-docusate, DISCONTD: sodium chloride, DISCONTD: hydrALAZINE, DISCONTD: sodium chloride  Diet:  Cardiac thin liquids Activity:  OOB with assistance DVT Prophylaxis:  SCDs   CLINICALLY SIGNIFICANT STUDIES Basic Metabolic Panel:  Lab 05/15/12 9604 05/15/12 1815  NA 141 141  K 4.2 4.3  CL 104 102  CO2 -- 25  GLUCOSE 298* 306*  BUN 10 11  CREATININE 0.90 0.93  CALCIUM -- 9.9  MG -- --  PHOS -- --   Liver Function Tests:  Lab 05/15/12 1815  AST 29  ALT 40  ALKPHOS 90  BILITOT 0.5  PROT 7.8  ALBUMIN 4.2   CBC:  Lab 05/17/12 0415 05/15/12 1827 05/15/12 1815  WBC 11.9* -- 10.9*  NEUTROABS -- -- 8.5*  HGB 16.3 17.3* --  HCT 46.0 51.0 --  MCV 86.5 -- 87.1  PLT 196 -- 196   Coagulation:  Lab 05/15/12 1815  LABPROT 13.7  INR 1.03   Cardiac Enzymes:  Lab 05/15/12 1815  CKTOTAL 109  CKMB 2.3  CKMBINDEX --  TROPONINI <0.30   Lipid Panel    Component Value Date/Time   CHOL 202* 05/16/2012 0435   TRIG 86 05/16/2012 0435   HDL 56 05/16/2012 0435   CHOLHDL 3.6 05/16/2012 0435   VLDL 17 05/16/2012 0435   LDLCALC 129* 05/16/2012 0435   HgbA1C  Lab Results  Component Value Date   HGBA1C 10.8* 05/16/2012    Urine Drug Screen:     Component Value Date/Time   LABOPIA NONE DETECTED 05/15/2012 2245   COCAINSCRNUR NONE DETECTED 05/15/2012 2245   LABBENZ NONE DETECTED 05/15/2012 2245   AMPHETMU NONE DETECTED 05/15/2012 2245   THCU NONE DETECTED 05/15/2012 2245   LABBARB NONE DETECTED 05/15/2012 2245    Alcohol Level: No results found for this basename: ETH:2 in the last 168 hours  CT of the brain  05/15/2012 Atrophy with extensive small vessel chronic ischemic changes of deep cerebral white matter. No acute  intracranial abnormalities.    MRI of the brain  05/16/2012  1.2 cm acute infarction within the left ventral pons.  5 mm acute infarction within the dorsal left pons.  The 3-4 mm acute infarction within the right parietal subcortical white matter. 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  No hemorrhage.  Extensive chronic small vessel changes elsewhere throughout the brain.   MRA of the brain  05/16/2012    No major vessel occlusion or correctable proximal stenosis.  Distal vessel atherosclerotic changes diffusely.   2D Echocardiogram    Carotid Doppler  No internal carotid artery stenosis bilaterally. Vertebrals with antegrade flow bilaterally.   CXR  05/16/2012  .  Mild cardiomegaly.  Low lung volumes.    EKG  normal sinus rhythm.   Therapy Recommendations PT -TBD, OT -TBD  Physical Exam: Awake alert.Obese Caucasian male. Afebrile. Head is nontraumatic. Neck is supple without bruit. Hearing is normal. Cardiac exam no murmur or gallop. Lungs are clear to auscultation. Distal pulses are well felt. No pedal edema.  Neurological Exam : Awake Alert oriented x 3 exam. Mild dysarthric speech but normal language.eye movements now without left internuclear opthalmoplegia with left eye horizontal gaze slow but full range and good vertical movements. Face symmetric.Good cough and gag. Tongue midline.  Normal strength, tone, reflexes and coordination. Normal sensation. Gait deferred.   ASSESSMENT [name] is a 61 y.o. 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 Incidental 3-4 mm acute infarction within the right parietal subcortical white matter also secondary to small vessel disease. Infarcts not consistent with embolic disease.  On no antiplatelets prior to admission. Now on aspirin 325 mg orally every day for secondary stroke prevention. Patient with resultant diplopia.  -hyperlipidemia, LDL 129 -hypertension -diabetes -obesity, Body mass  index is 35.40 kg/(m^2).  Hospital day # 2  TREATMENT/PLAN -Continue aspirin 325 mg orally every day for secondary stroke prevention. -add statin -ok for transfer to 3000 from neuro standpoint -rehab consult (therapy document their recommendation is TBD-pt had difficulty sitting up)  Braxston Quinter, AVNP, ANP-BC, GNP-BC [facility] Pager: [phone] 05/17/2012 7:55 AM  Dr. Delia Heady, Stroke Center Medical Director, has personally reviewed chart, pertinent data, examined the patient and developed the plan of care. Pager:  [phone] \

## 2012-05-17 NOTE — Progress Notes (Signed)
Rehab admissions - Evaluated for possible admission.  Please see rehab consult done today by Dr. Riley Kill recommending outpatient therapy follow-up.  Doing well with therapies and will not need an acute inpatient rehab stay.  #[phone]

## 2012-05-17 NOTE — Progress Notes (Signed)
Report called to Dakota Hall, pt going to [phone] via w/c with wife and belongings, no complaints voiced at this time. [name]

## 2012-05-18 DIAGNOSIS — Z91199 Patient's noncompliance with other medical treatment and regimen due to unspecified reason: Secondary | ICD-10-CM

## 2012-05-18 DIAGNOSIS — I6350 Cerebral infarction due to unspecified occlusion or stenosis of unspecified cerebral artery: Principal | ICD-10-CM

## 2012-05-18 DIAGNOSIS — Z9119 Patient's noncompliance with other medical treatment and regimen: Secondary | ICD-10-CM

## 2012-05-18 DIAGNOSIS — I639 Cerebral infarction, unspecified: Secondary | ICD-10-CM

## 2012-05-18 LAB — GLUCOSE, CAPILLARY: Glucose-Capillary: 182 mg/dL — ABNORMAL HIGH (ref 70–99)

## 2012-05-18 MED ORDER — ASPIRIN 325 MG PO TABS: 325.0000 mg | ORAL_TABLET | Freq: Every day | ORAL | Status: AC

## 2012-05-18 MED ORDER — SIMVASTATIN 20 MG PO TABS: 20.0000 mg | ORAL_TABLET | Freq: Every day | ORAL | Status: DC

## 2012-05-18 MED ORDER — NEBIVOLOL HCL 5 MG PO TABS: 5.0000 mg | ORAL_TABLET | Freq: Every day | ORAL | Status: DC

## 2012-05-18 NOTE — Progress Notes (Signed)
Stroke Team Progress Note  HISTORY Dakota Hall is a 61 y.o. male who presented with sudden onset dizziness, perioral numbness and horizontal diplopia at 1 pm On 05/15/12 while working in yard. Wife noticed slurred speech as well. EMS reported elevated BP. He is feeling better now but dysarthria and diplopia persist.Ct head shows no acute abnormality only mild small vessel disease and generalized atrophy.. H/O similar episode 1 month ago at work without diplopia resolved in 1 hour did not seek medical help. He has been noncompliant with his medicines and not taken them for 2 weeks except depression pills twice last week only.No known h/o documented stroke or TIA. Patient was not a TPA candidate secondary to presenting beyond the tPA window. He was admitted for further evaluation and treatment.  SUBJECTIVE His wife is at the bedside.  Overall he feels his condition is improving. He is ready to go home.   OBJECTIVE Most recent Vital Signs: Filed Vitals:   05/17/12 2210 05/18/12 0140 05/18/12 0612 05/18/12 1014  BP: 122/61 144/74 142/73 148/61  Pulse: 57 50 49 47  Temp: 98.1 F (36.7 C) 97.3 F (36.3 C) 96.8 F (36 C) 97.8 F (36.6 C)  TempSrc: Oral Oral Oral Oral  Resp: 17 17 17 18   Height:      Weight:      SpO2: 96% 97% 98% 97%   CBG (last 3)  Basename 05/18/12 0659 05/17/12 2207 05/17/12 1645  GLUCAP 182* 195* 245*   Intake/Output from previous day: 07/01 0701 - 07/02 0700 In: 480 [P.O.:480] Out: -   IV Fluid Intake:     MEDICATIONS    . amLODipine  10 mg Oral Daily  . antiseptic oral rinse  15 mL Mouth Rinse BID  . aspirin  325 mg Oral Daily  . busPIRone  15 mg Oral TID  . carvedilol  3.125 mg Oral BID WC  . hydrochlorothiazide  12.5 mg Oral Daily  . insulin aspart  0-20 Units Subcutaneous TID WC  . insulin aspart  0-5 Units Subcutaneous QHS  . insulin glargine  20 Units Subcutaneous QHS  . irbesartan  150 mg Oral Daily  . metFORMIN  1,000 mg Oral BID WC  .  pioglitazone  15 mg Oral Daily  . sertraline  150 mg Oral Daily  . simvastatin  20 mg Oral q1800  . DISCONTD: hydrochlorothiazide  12.5 mg Oral BID  . DISCONTD: insulin glargine  10 Units Subcutaneous QHS  . DISCONTD: metFORMIN  1,000 mg Oral Q breakfast  . DISCONTD: valsartan  160 mg Oral BID  . DISCONTD: valsartan  160 mg Oral Daily   PRN:  acetaminophen, hydrALAZINE, morphine injection, oxyCODONE, senna-docusate, DISCONTD: acetaminophen  Diet:  Cardiac thin liquids Activity:  OOB with assistance DVT Prophylaxis:  SCDs   CLINICALLY SIGNIFICANT STUDIES Basic Metabolic Panel:  Lab 05/15/12 4540 05/15/12 1815  NA 141 141  K 4.2 4.3  CL 104 102  CO2 -- 25  GLUCOSE 298* 306*  BUN 10 11  CREATININE 0.90 0.93  CALCIUM -- 9.9  MG -- --  PHOS -- --   Liver Function Tests:  Lab 05/15/12 1815  AST 29  ALT 40  ALKPHOS 90  BILITOT 0.5  PROT 7.8  ALBUMIN 4.2   CBC:  Lab 05/17/12 0415 05/15/12 1827 05/15/12 1815  WBC 11.9* -- 10.9*  NEUTROABS -- -- 8.5*  HGB 16.3 17.3* --  HCT 46.0 51.0 --  MCV 86.5 -- 87.1  PLT 196 -- 196  Coagulation:  Lab 05/15/12 1815  LABPROT 13.7  INR 1.03   Cardiac Enzymes:  Lab 05/15/12 1815  CKTOTAL 109  CKMB 2.3  CKMBINDEX --  TROPONINI <0.30   Lipid Panel    Component Value Date/Time   CHOL 202* 05/16/2012 0435   TRIG 86 05/16/2012 0435   HDL 56 05/16/2012 0435   CHOLHDL 3.6 05/16/2012 0435   VLDL 17 05/16/2012 0435   LDLCALC 129* 05/16/2012 0435   HgbA1C  Lab Results  Component Value Date   HGBA1C 10.8* 05/16/2012    Urine Drug Screen:     Component Value Date/Time   LABOPIA NONE DETECTED 05/15/2012 2245   COCAINSCRNUR NONE DETECTED 05/15/2012 2245   LABBENZ NONE DETECTED 05/15/2012 2245   AMPHETMU NONE DETECTED 05/15/2012 2245   THCU NONE DETECTED 05/15/2012 2245   LABBARB NONE DETECTED 05/15/2012 2245    Alcohol Level: No results found for this basename: ETH:2 in the last 168 hours  CT of the brain  05/15/2012 Atrophy with  extensive small vessel chronic ischemic changes of deep cerebral white matter. No acute intracranial abnormalities.    MRI of the brain  05/16/2012  1.2 cm acute infarction within the left ventral pons.  5 mm acute infarction within the dorsal left pons.  The 3-4 mm acute infarction within the right parietal subcortical white matter. 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  No hemorrhage.  Extensive chronic small vessel changes elsewhere throughout the brain.   MRA of the brain  05/16/2012    No major vessel occlusion or correctable proximal stenosis.  Distal vessel atherosclerotic changes diffusely.   2D Echocardiogram  EF 65-70% with no source of embolus.   Carotid Doppler  No internal carotid artery stenosis bilaterally. Vertebrals with antegrade flow bilaterally.   CXR  05/16/2012  .  Mild cardiomegaly.  Low lung volumes.    EKG  normal sinus rhythm.   Therapy Recommendations PT -OP, OT -OP  Physical Exam: Awake alert.Obese Caucasian male. Afebrile. Head is nontraumatic. Neck is supple without bruit. Hearing is normal. Cardiac exam no murmur or gallop. Lungs are clear to auscultation. Distal pulses are well felt. No pedal edema.  Neurological Exam : Awake Alert oriented x 3 exam. Mild dysarthric speech but normal language.eye movements now without left internuclear opthalmoplegia with left eye horizontal gaze slow but full range and good vertical movements. Face symmetric.Good cough and gag. Tongue midline.  Normal strength, tone, reflexes and coordination. Normal sensation. Gait deferred.   ASSESSMENT [name] is a 61 y.o. 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 Incidental 3-4 mm acute infarction within the right parietal subcortical white matter also secondary to small vessel disease. Infarcts not consistent with embolic disease.  On no antiplatelets prior to admission. Now on aspirin 325 mg orally every day for secondary stroke  prevention. Patient with resultant diplopia.  -hyperlipidemia, LDL 129, on statin -diabetes, poorly controlled, A1c 10.8 -obesity, Body mass index is 35.40 kg/(m^2).  Hospital day # 3  TREATMENT/PLAN -Continue aspirin 325 mg orally every day for secondary stroke prevention. -OP PT and OT -Stroke Service will sign off. Follow up with Dr. Pearlean Brownie in 2 months.  Joaquin Music, ANP-BC, GNP-BC [facility] Pager: [phone] 05/18/2012 11:17 AM  Dr. Delia Heady, Stroke Center Medical Director, has personally reviewed chart, pertinent data, examined the patient and developed the plan of care. Pager:  [phone]

## 2012-05-18 NOTE — Progress Notes (Signed)
Appreciate TRH and stroke team care of my long-time patient. Unfortunately, he is of the habit of occasionally just stopping his meds as "he gets tired of taking them." Don't really know how to influence him to do better, but perhaps these events will do so.   It looks like he is nearing the end of his hospitalization, and I would request Dr. Sharon Seller continue to care for him. I will be happy to see him for followup soon after d/c.

## 2012-05-18 NOTE — Care Management Note (Signed)
    Page 1 of 1   05/18/2012     2:31:24 PM   CARE MANAGEMENT NOTE 05/18/2012  Patient:  HUSSAIN, MAIMONE   Account Number:  [id number]  Date Initiated:  05/18/2012  Documentation initiated by:  [facility]  Subjective/Objective Assessment:   Admitted with CVA. Lives with wife.     Action/Plan:   PT eval  OT eval   Anticipated DC Date:  05/18/2012   Anticipated DC Plan:  HOME/SELF CARE      DC Planning Services  CM consult  OP Neuro Rehab      [facility] Choice  IP REHAB   Choice offered to / List presented to:             Status of service:  Completed, signed off Medicare Important Message given?   (If response is "NO", the following Medicare IM given date fields will be blank) Date Medicare IM given:   Date Additional Medicare IM given:    Discharge Disposition:  HOME/SELF CARE  Per UR Regulation:    If discussed at Long Length of Stay Meetings, dates discussed:    Comments:  PCP Dr. Theressa Millard  05/18/12 Order for outpatient PT,OT and ST received. Spoke with patient and wife about outpatient PT,OT and ST. They are agreeable to [facility]. Gave them address and phone number. Will have rehab call them to make appt. Verifed ho,me and cell number.Faxed  referral, order Hand P, d/c summary and OT note to Neuro[facility]hab. Verified receipt with [place]. They will contact patient to make appts. Jacquelynn Cree RN, BSN, CCM

## 2012-05-18 NOTE — Progress Notes (Signed)
Occupational Therapy Treatment Patient Details Name: Dakota Hall MRN: [id number] DOB: 22-Oct-1951 Today's Date: 05/18/2012 Time: 7829-5621 OT Time Calculation (min): 53 min  OT Assessment / Plan / Recommendation Comments on Treatment Session Pt. tolerating nasal occlusion to glasses.  Pt. able to complete BADLs with min guard assist to min A per pt and wife report.  Pt. has been instructed in HEP for vision and with safety issues regarding vision.  Recommend OPOT    Follow Up Recommendations  Outpatient OT;Supervision/Assistance - 24 hour    Barriers to Discharge       Equipment Recommendations  None recommended by OT    Recommendations for Other Services    Frequency Min 3X/week   Plan Discharge plan remains appropriate    Precautions / Restrictions Precautions Precautions: Fall Precaution Comments: Diplopia Restrictions Weight Bearing Restrictions: No   Pertinent Vitals/Pain     ADL  Transfers/Ambulation Related to ADLs: min guard assist ADL Comments: Pt. participated in visual assesment:   c/o diplopia with distant vision and when looking to Lt.  OS:  difficulty sustaining lateral movment and superior movements.  OD:  difficulty sustaining Medial gaze.  Convergence/divergence WFL.  Fields:  No deficit noted with confrontation testing.   Eye patch removed and nasal occulsion to Rt .lens of glasses - fully eliminated the diplopia while allowing eyes to work binocularly, and allow for peripheral input to both eyes.  Pt. initially noted to run into items on Rt. when ambulating.  Fields re-checked with no obvious deficit.  Pt. then ambulated in hallway, negotiated all obstacles.  Walks very closely to items on Rt., but makes correction to avoid them.  Question running into items due to delayed scanning and visual processing as it improved with challenges.  Long discussion with pt. and wife re: visual system, and visual challenges.  Instruted him in HEP for occular ROM and to also play  video games and balloon volleyball to challeng visual system.  Instructed pt. to avoid busy/congested community environments and if he does venture forth to have wife walk on his Rt. side as he does tend to vear to Rt. when ambulating.  Pt. eager to discharge home.  All questions answered.    OT Diagnosis:    OT Problem List:   OT Treatment Interventions:     OT Goals ADL Goals ADL Goal: Toilet Transfer - Progress: Progressing toward goals Miscellaneous OT Goals OT Goal: Miscellaneous Goal #1 - Progress: Progressing toward goals OT Goal: Miscellaneous Goal #2 - Progress: Progressing toward goals  Visit Information  Last OT Received On: 05/18/12 Assistance Needed: +1    Subjective Data      Prior Functioning       Cognition  Overall Cognitive Status: Appears within functional limits for tasks assessed/performed Arousal/Alertness: Awake/alert Orientation Level: Appears intact for tasks assessed Behavior During Session: [name] for tasks performed    Mobility Transfers Transfers: Sit to Stand;Stand to Sit Sit to Stand: 4: Min guard;From bed;With upper extremity assist Stand to Sit: 4: Min guard;To chair/3-in-1;With upper extremity assist;With armrests   Exercises    Balance    End of Session OT - End of Session Activity Tolerance: Patient tolerated treatment well Patient left: in bed;with call bell/phone within reach;with family/visitor present Nurse Communication:  (pt. performance during OT)  GO     Janira Mandell M 05/18/2012, 12:23 PM

## 2012-05-18 NOTE — Discharge Summary (Signed)
DISCHARGE SUMMARY  Dakota Hall  MR#: [id number]  DOB:02-Jun-1951  Date of Admission: 05/15/2012 Date of Discharge: 05/18/2012  Attending Physician:Seaton Hofmann  Patient's JYN:Dakota Hall,Dakota Leonette Most, MD  Consults: Neurology  Presenting Complaint: Double vision and slurred speech.  Discharge Diagnoses: Principal Problem:  Stroke, small vessel Active Problems:  HTN (hypertension)  Noncompliance with medication regimen  DM (diabetes mellitus)- uncontrolled  Vision changes     Discharge Medications: Medication List  As of 05/18/2012 12:24 PM   TAKE these medications         amLODipine 10 MG tablet   Commonly known as: NORVASC   Take 10 mg by mouth daily.      aspirin 325 MG tablet   Take 1 tablet (325 mg total) by mouth daily.      busPIRone 15 MG tablet   Commonly known as: BUSPAR   Take 15 mg by mouth 3 (three) times daily.      metFORMIN 500 MG 24 hr tablet   Commonly known as: GLUCOPHAGE-XR   Take 1,000 mg by mouth daily with breakfast.      nebivolol 5 MG tablet   Commonly known as: BYSTOLIC   Take 1 tablet (5 mg total) by mouth daily.      pioglitazone 15 MG tablet   Commonly known as: ACTOS   Take 15 mg by mouth daily.      sertraline 100 MG tablet   Commonly known as: ZOLOFT   Take 150 mg by mouth daily.      simvastatin 20 MG tablet   Commonly known as: ZOCOR   Take 1 tablet (20 mg total) by mouth daily at 6 PM.      traZODone 50 MG tablet   Commonly known as: DESYREL   Take 50-100 mg by mouth daily as needed. For sleep      valsartan-hydrochlorothiazide 160-12.5 MG per tablet   Commonly known as: DIOVAN-HCT   Take 1 tablet by mouth 2 (two) times daily.             Procedures: Dg Chest 2 View  05/16/2012  *RADIOLOGY REPORT*  Clinical Data: Nausea, diabetes  CHEST - 2 VIEW  Comparison: 08/09/2007  Findings: Mild cardiomegaly.  Low lung volumes with resultant crowding of bronchovascular structures.  No confluent airspace infiltrate or overt  edema.  No definite effusion.  Mildly tortuous thoracic aorta.  IMPRESSION:  1.  Mild cardiomegaly.  Low lung volumes.  Original Report Authenticated By: Osa Craver, M.D.   Ct Head Wo Contrast  05/15/2012  *RADIOLOGY REPORT*  Clinical Data:  Slurred speech, blurred vision  CT HEAD WITHOUT CONTRAST  Technique:  Contiguous axial images were obtained from the base of the skull through the vertex without contrast.  Comparison: None  Findings: Mild atrophy. Normal ventricular morphology. No midline shift or mass effect. Extensive small vessel chronic ischemic changes of deep cerebral white matter. Arachnoid granulations identified at the sigmoid sinuses bilaterally. No intracranial hemorrhage, mass lesion, or evidence of acute infarction. No extra-axial fluid collections. Visualized paranasal sinuses and mastoid air cells clear. No acute osseous findings.  IMPRESSION: Atrophy with extensive small vessel chronic ischemic changes of deep cerebral white matter. No acute intracranial abnormalities.  Findings discussed with Dr. Karma Ganja prior to dictation of this report.  Original Report Authenticated By: Lollie Marrow, M.D.   Mr Brain Wo Contrast  05/16/2012  *RADIOLOGY REPORT*  Clinical Data:  Slurred speech.  Hypertension.  Dizziness.  Visual disturbance.  MRI HEAD WITHOUT CONTRAST  MRA HEAD WITHOUT CONTRAST  Technique:  Multiplanar, multiecho pulse sequences of the brain and surrounding structures were obtained without intravenous contrast. Angiographic images of the head were obtained using MRA technique without contrast.  Comparison:  Head CT 06/29.  MRI 01/16/2009  MRI HEAD  Findings:  Diffusion imaging shows foci of acute infarction affecting the left side of the pons.  The largest area is a 1.2 cm region in the ventral left pons.  There is a 5 mm focus in the dorsal left pons extending to the floor of the fourth ventricle. There is a 3-4 mm acute infarction within the right parietal subcortical white  matter.  No other acute infarction.  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  The areas of infarction do not show hemorrhage.  Elsewhere, there are chronic small vessel changes within the pons. No focal cerebellar insult.  The cerebral hemispheres show extensive chronic small vessel changes affecting the deep and subcortical white matter.  No large vessel territory infarction. No mass lesion, hemorrhage, hydrocephalus or extra-axial collection.  No pituitary mass.  No inflammatory sinus disease.  IMPRESSION: 1.2 cm acute infarction within the left ventral pons.  5 mm acute infarction within the dorsal left pons.  The 3-4 mm acute infarction within the right parietal subcortical white matter. 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  No hemorrhage.  Extensive chronic small vessel changes elsewhere throughout the brain.  MRA HEAD  Findings: Both internal carotid arteries are widely patent into the brain.  No siphon narrowing or irregularity.  The anterior and middle cerebral vessels are patent without proximal stenosis, aneurysm or vascular malformation.  More distal branch vessels shows some atherosclerotic irregularity.  Both vertebral arteries are widely patent to the basilar.  There is mild atherosclerotic irregularity of the distal left vertebral artery but no flow- limiting stenosis.  The basilar artery shows mild irregularity but no stenosis.  Posterior circulation branch vessels are patent, with mild atherosclerotic irregularity of the distal branches.  IMPRESSION:  No major vessel occlusion or correctable proximal stenosis.  Distal vessel atherosclerotic changes diffusely.  Original Report Authenticated By: Thomasenia Sales, M.D.   Mr Mra Head/brain Wo Cm  05/16/2012  *RADIOLOGY REPORT*  Clinical Data:  Slurred speech.  Hypertension.  Dizziness.  Visual disturbance.  MRI HEAD WITHOUT CONTRAST MRA HEAD WITHOUT CONTRAST  Technique:  Multiplanar, multiecho pulse sequences of the  brain and surrounding structures were obtained without intravenous contrast. Angiographic images of the head were obtained using MRA technique without contrast.  Comparison:  Head CT 06/29.  MRI 01/16/2009  MRI HEAD  Findings:  Diffusion imaging shows foci of acute infarction affecting the left side of the pons.  The largest area is a 1.2 cm region in the ventral left pons.  There is a 5 mm focus in the dorsal left pons extending to the floor of the fourth ventricle. There is a 3-4 mm acute infarction within the right parietal subcortical white matter.  No other acute infarction.  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  The areas of infarction do not show hemorrhage.  Elsewhere, there are chronic small vessel changes within the pons. No focal cerebellar insult.  The cerebral hemispheres show extensive chronic small vessel changes affecting the deep and subcortical white matter.  No large vessel territory infarction. No mass lesion, hemorrhage, hydrocephalus or extra-axial collection.  No pituitary mass.  No inflammatory sinus disease.  IMPRESSION: 1.2 cm acute infarction within the left ventral pons.  5 mm acute  infarction within the dorsal left pons.  The 3-4 mm acute infarction within the right parietal subcortical white matter. 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  No hemorrhage.  Extensive chronic small vessel changes elsewhere throughout the brain.    MRA HEAD  Findings: Both internal carotid arteries are widely patent into the brain.  No siphon narrowing or irregularity.  The anterior and middle cerebral vessels are patent without proximal stenosis, aneurysm or vascular malformation.  More distal branch vessels shows some atherosclerotic irregularity.  Both vertebral arteries are widely patent to the basilar.  There is mild atherosclerotic irregularity of the distal left vertebral artery but no flow- limiting stenosis.  The basilar artery shows mild irregularity but no  stenosis.  Posterior circulation branch vessels are patent, with mild atherosclerotic irregularity of the distal branches.  IMPRESSION:  No major vessel occlusion or correctable proximal stenosis.  Distal vessel atherosclerotic changes diffusely.  Original Report Authenticated By: Thomasenia Sales, M.D.    Echocardiogram- 05/16/12 - Left ventricle: The cavity size was normal. There was mild concentric hypertrophy. Systolic function was vigorous. The estimated ejection fraction was in the range of 65% to 70%. Wall motion was normal; there were no regional wall motion abnormalities. Doppler parameters are consistent with abnormal left ventricular relaxation (grade 1 diastolic dysfunction). The E/e' ratio is >10, suggesting elevated LV filling pressure. - Mitral valve: Mildly thickened leaflets . Trivial regurgitation. - Left atrium: Moderately dilated. - Right ventricle: The cavity size was mildly dilated. Wall thickness was normal. - Right atrium: Moderately dilated.    Hospital Course: 61 yo male h/o htn, dm off bp meds for over 2 weeks comes in with sudden onset of dizziness, double vision, slurred speech with perioral numbness and tongue numbness. Symptoms improved in the ED but did not resolve. Ct head revealed no acute abnormality - only mild small vessel disease and generalized atrophy. MRI revealed and acute infarct in the left ventral pons.  On discharge he continues to have double vision and some slurred speech and outpatient PT/OT and SLP has been recommended and ordered. He has already has walkers at home and has a wife who will also assist him. A lipid panel was not checked but due to acute CVA and concomitant DM, he does need a statin. He has been started on Simvastatin.    Noncompliance with medication regimen He has all of the medications he was supposed to be taking in his room. He is in agreement with resuming them and taking them properly.    DM (diabetes mellitus) A1c was  10.8 and sugars will likely not be controlled with oral agents alone but we have not added Insulin yet to his regimen. Despite giving hime Lantus 20 mg in addition to PO meds, his sugars have been close to 200.  He can f/u with his PCP for this.   HTN (hypertension) BP fairly contolled but could be better. Again outpt f/u for this.   Obesity  Advised to lose weight.    Day of Discharge Physical Exam: BP 148/61  Pulse 47  Temp 97.8 F (36.6 C) (Oral)  Resp 18  Ht 5\' 8"  (1.727 m)  Wt 105.6 kg (232 lb 12.9 oz)  BMI 35.40 kg/m2  SpO2 97% General: No acute respiratory distress - currently wearing patch on L eye  Lungs: Clear to auscultation bilaterally without wheezes or crackles  Cardiovascular: Regular rate and rhythm without murmur gallop or rub  Abdomen: obese, nontender, nondistended, soft, bowel sounds positive, no  rebound, no ascites, no appreciable mass  Extremities: No significant cyanosis, clubbing, or edema bilateral lower extremities      Results for orders placed during the hospital encounter of 05/15/12 (from the past 24 hour(s))  GLUCOSE, CAPILLARY     Status: Abnormal   Collection Time   05/17/12  4:45 PM      Component Value Range   Glucose-Capillary 245 (*) 70 - 99 mg/dL  GLUCOSE, CAPILLARY     Status: Abnormal   Collection Time   05/17/12 10:07 PM      Component Value Range   Glucose-Capillary 195 (*) 70 - 99 mg/dL   Comment 1 Documented in Chart     Comment 2 Notify RN    GLUCOSE, CAPILLARY     Status: Abnormal   Collection Time   05/18/12  6:59 AM      Component Value Range   Glucose-Capillary 182 (*) 70 - 99 mg/dL   Comment 1 Documented in Chart     Comment 2 Notify RN      Disposition: stable   Follow up appts: - f/u with Dr Pearlean Brownie in 2 months - Dr Allie Dimmer in 1 wk. -Outpt PT/OT/SLP  Time on Discharge: >45 min  Signed: Alieah Brinton 05/18/2012, 12:24 PM

## 2012-05-18 NOTE — Progress Notes (Signed)
Physical Therapy Treatment Patient Details Name: Dakota Hall MRN: [id number] DOB: [birth year] Today's Date: 05/18/2012 Time: 1030-1056 PT Time Calculation (min): 26 min  PT Assessment / Plan / Recommendation Comments on Treatment Session  Patient s/p brainstem CVA with diplopia. Patient wearing eye patch today with mobility. Patient initially with impulsive behavior but that subsided with increased activity and his awareness of his deficits. Today was the first time that the patient had ambulated and became emotional when noticing his deficits.     Follow Up Recommendations  Inpatient Rehab    Barriers to Discharge        Equipment Recommendations  Rolling walker with 5" wheels    Recommendations for Other Services    Frequency Min 4X/week   Plan Discharge plan remains appropriate;Frequency remains appropriate    Precautions / Restrictions Precautions Precautions: Fall Precaution Comments: Diplopia Restrictions Weight Bearing Restrictions: No   Pertinent Vitals/Pain     Mobility  Bed Mobility Supine to Sit: 7: Independent Sitting - Scoot to Edge of Bed: 7: Independent Sit to Supine: 7: Independent Transfers Sit to Stand: 4: Min guard;From bed;With upper extremity assist Stand to Sit: With upper extremity assist;To bed;4: Min guard Details for Transfer Assistance: Patient stood x2 with cues for safety Ambulation/Gait Ambulation/Gait Assistance: 4: Min assist Ambulation Distance (Feet): 300 Feet Assistive device: Rolling walker;None Ambulation/Gait Assistance Details: Patient ambulated without RW requring Min A as he had several LOB to his right side and continued to stagger to his right. Patient given RW and stability increased however still required Min A for RW management as he would run into objects of this right side of the hall and floor. Patient stated that he feels more stable with use of RW and agreed to use it within the house.  Gait Pattern: Step-through  pattern;Decreased stride length;Narrow base of support Stairs: Yes Stairs Assistance: 4: Min assist Stairs Assistance Details (indicate cue type and reason): A with LOB ascending step x1. Cues for safety with use of rail. Stair Management Technique: One rail Left Number of Stairs: 10  Modified Rankin (Stroke Patients Only) Pre-Morbid Rankin Score: No symptoms Modified Rankin: Moderately severe disability    Exercises     PT Diagnosis:    PT Problem List:   PT Treatment Interventions:     PT Goals Acute Rehab PT Goals PT Goal: Sit to Stand - Progress: Met PT Goal: Stand to Sit - Progress: Progressing toward goal PT Transfer Goal: Bed to Chair/Chair to Bed - Progress: Progressing toward goal PT Goal: Ambulate - Progress: Progressing toward goal PT Goal: Up/Down Stairs - Progress: Progressing toward goal  Visit Information  Last PT Received On: 05/18/12 Assistance Needed: +1    Subjective Data  Subjective: Pt became tearful this session reguarding circumstances   Cognition  Overall Cognitive Status: Appears within functional limits for tasks assessed/performed Arousal/Alertness: Awake/alert Orientation Level: Appears intact for tasks assessed Behavior During Session: [facility] for tasks performed    Balance     End of Session PT - End of Session Equipment Utilized During Treatment: Gait belt Activity Tolerance: Patient tolerated treatment well Patient left: in chair Nurse Communication: Mobility status   GP     Fredrich Birks 05/18/2012, 12:54 PM 05/18/2012 Fredrich Birks PTA [phone] pager [phone] office

## 2012-05-18 NOTE — Progress Notes (Signed)
Upon d/c all health education given, and gave prescriptions and d/c summery to the pt. No concerns.

## 2012-05-18 NOTE — Progress Notes (Signed)
Clinical Social Work  CSW received referral for ?SNF. CSW reviewed chart which stated patient will need outpatient PT. CSW is signing off but available if needed.  [place], [place] [phone]

## 2012-05-18 NOTE — Discharge Instructions (Signed)
STROKE/TIA DISCHARGE INSTRUCTIONS SMOKING Cigarette smoking nearly doubles your risk of having a stroke & is the single most alterable risk factor  If you smoke or have smoked in the last 12 months, you are advised to quit smoking for your health.  Most of the excess cardiovascular risk related to smoking disappears within a year of stopping.  Ask you doctor about anti-smoking medications  [place] Quit Line: 1-800-QUIT NOW  Free Smoking Cessation Classes [phone]  CHOLESTEROL Know your levels; limit fat & cholesterol in your diet  Lipid Panel     Component Value Date/Time   CHOL 202* 05/16/2012 0435   TRIG 86 05/16/2012 0435   HDL 56 05/16/2012 0435   CHOLHDL 3.6 05/16/2012 0435   VLDL 17 05/16/2012 0435   LDLCALC 129* 05/16/2012 0435      Many patients benefit from treatment even if their cholesterol is at goal.  Goal: Total Cholesterol (CHOL) less than 160  Goal:  Triglycerides (TRIG) less than 150  Goal:  HDL greater than 40  Goal:  LDL (LDLCALC) less than 100   BLOOD PRESSURE American Stroke Association blood pressure target is less that 120/80 mm/Hg  Your discharge blood pressure is:  BP: 155/78 mmHg  Monitor your blood pressure  Limit your salt and alcohol intake  Many individuals will require more than one medication for high blood pressure  DIABETES (A1c is a blood sugar average for last 3 months) Goal HGBA1c is under 7% (HBGA1c is blood sugar average for last 3 months)  Diabetes: {STROKE DC DIABETES:22357}    Lab Results  Component Value Date   HGBA1C 10.8* 05/16/2012     Your HGBA1c can be lowered with medications, healthy diet, and exercise.  Check your blood sugar as directed by your physician  Call your physician if you experience unexplained or low blood sugars.  PHYSICAL ACTIVITY/REHABILITATION Goal is 30 minutes at least 4 days per week    {STROKE DC ACTIVITY/REHAB:22359}  Activity decreases your risk of heart attack and stroke and makes your heart  stronger.  It helps control your weight and blood pressure; helps you relax and can improve your mood.  Participate in a regular exercise program.  Talk with your doctor about the best form of exercise for you (dancing, walking, swimming, cycling).  DIET/WEIGHT Goal is to maintain a healthy weight  Your discharge diet is: Cardiac *** liquids Your height is:  Height: 5\' 8"  (172.7 cm) Your current weight is: Weight: 105.6 kg (232 lb 12.9 oz) Your Body Mass Index (BMI) is:  BMI (Calculated): 35.5   Following the type of diet specifically designed for you will help prevent another stroke.  Your goal weight range is:  ***  Your goal Body Mass Index (BMI) is 19-24.  Healthy food habits can help reduce 3 risk factors for stroke:  High cholesterol, hypertension, and excess weight.  RESOURCES Stroke/Support Group:  Call [phone]  they meet the 3rd Sunday of the month on the Rehab Unit at [facility], [place] ( no meetings June, July & Aug).  STROKE EDUCATION PROVIDED/REVIEWED AND GIVEN TO PATIENT Stroke warning signs and symptoms How to activate emergency medical system (call 911). Medications prescribed at discharge. Need for follow-up after discharge. Personal risk factors for stroke. Pneumonia vaccine given:   {STROKE DC YES/NO/DATE:22363} Flu vaccine given:   {STROKE DC YES/NO/DATE:22363} My questions have been answered, the writing is legible, and I understand these instructions.  I will adhere to these goals & educational materials that have  been provided to me after my discharge from the hospital.

## 2012-07-06 ENCOUNTER — Ambulatory Visit: Payer: Managed Care, Other (non HMO) | Attending: Internal Medicine | Admitting: *Deleted

## 2012-07-06 DIAGNOSIS — IMO0001 Reserved for inherently not codable concepts without codable children: Secondary | ICD-10-CM | POA: Insufficient documentation

## 2012-07-06 DIAGNOSIS — I69922 Dysarthria following unspecified cerebrovascular disease: Secondary | ICD-10-CM | POA: Insufficient documentation

## 2013-01-27 ENCOUNTER — Other Ambulatory Visit: Payer: Self-pay | Admitting: Neurology

## 2013-01-27 DIAGNOSIS — I6350 Cerebral infarction due to unspecified occlusion or stenosis of unspecified cerebral artery: Secondary | ICD-10-CM

## 2013-02-08 ENCOUNTER — Ambulatory Visit (INDEPENDENT_AMBULATORY_CARE_PROVIDER_SITE_OTHER): Payer: Managed Care, Other (non HMO)

## 2013-02-08 DIAGNOSIS — I6350 Cerebral infarction due to unspecified occlusion or stenosis of unspecified cerebral artery: Secondary | ICD-10-CM

## 2013-02-21 ENCOUNTER — Telehealth: Payer: Self-pay | Admitting: Neurology

## 2013-02-21 NOTE — Telephone Encounter (Signed)
Pt calling for doppler results.   (had 02-08-13).  Stated he thought was poor business practice re: getting results back.  [phone].  Please call.   I did call him back.

## 2013-02-21 NOTE — Telephone Encounter (Signed)
I spoke to the patient and gave him the Doppler results. The velocities are slightly elevated on the left but without any underlying plaque noted. Recommend continue medical followup and repeat Doppler in one year.

## 2013-07-20 ENCOUNTER — Encounter: Payer: Self-pay | Admitting: Nurse Practitioner

## 2013-07-20 ENCOUNTER — Ambulatory Visit (INDEPENDENT_AMBULATORY_CARE_PROVIDER_SITE_OTHER): Payer: Managed Care, Other (non HMO) | Admitting: Nurse Practitioner

## 2013-07-20 VITALS — BP 130/78 | HR 57 | Ht 69.5 in | Wt 246.0 lb

## 2013-07-20 DIAGNOSIS — I10 Essential (primary) hypertension: Secondary | ICD-10-CM

## 2013-07-20 DIAGNOSIS — E119 Type 2 diabetes mellitus without complications: Secondary | ICD-10-CM

## 2013-07-20 DIAGNOSIS — I6350 Cerebral infarction due to unspecified occlusion or stenosis of unspecified cerebral artery: Secondary | ICD-10-CM | POA: Insufficient documentation

## 2013-07-20 NOTE — Progress Notes (Signed)
Reason for visit followup for stroke HPI:   62 year old male here for follow up left paramedian pontine infarct in area consistent with small vessel disease on 05/15/12.  Vascular risk factors include hypertension, hyperlipidemia, newly diagnosed diabetic, and obesity (BMI 34.24).  Doing well, no recurrent neurovascular symptoms.     07/20/13: He returns for followup after last visit on 01/20/13.  He continues to do well without any recurrent stroke or TIA symptoms.  He complains of occasional wobbliness of his gait and orthostasis particularly when he gets up.  He had last lipid profile every 3 months by Dr. Earl Gala.  His blood pressure today is 130/78. Is trying to lose weight and exercise. He has no new neurologic complaints  ROS:  14 system review of symptoms is negative except for the following Fatigue ,occasional shortness of breath   Medications Current Outpatient Prescriptions on File Prior to Visit  Medication Sig Dispense Refill  . amLODipine (NORVASC) 10 MG tablet Take 10 mg by mouth daily.      . metFORMIN (GLUCOPHAGE-XR) 500 MG 24 hr tablet Take 1,000 mg by mouth daily with breakfast.      . nebivolol (BYSTOLIC) 5 MG tablet Take 1 tablet (5 mg total) by mouth daily.  30 tablet  0  . pioglitazone (ACTOS) 15 MG tablet Take 15 mg by mouth daily.      . sertraline (ZOLOFT) 100 MG tablet Take 150 mg by mouth daily. Take 2 tabs (300) daily      . traZODone (DESYREL) 50 MG tablet Take 50-100 mg by mouth daily as needed. For sleep      . valsartan-hydrochlorothiazide (DIOVAN-HCT) 160-12.5 MG per tablet Take 1 tablet by mouth daily.       . simvastatin (ZOCOR) 20 MG tablet Take 1 tablet (20 mg total) by mouth daily at 6 PM.  30 tablet  0   No current facility-administered medications on file prior to visit.    Allergies  Allergies  Allergen Reactions  . Penicillins Hives    Physical Exam General: well developed, well nourished, seated, in no evident distress Head: head normocephalic  and atraumatic. Oropharynx benign Neck: supple with no carotid  bruits Cardiovascular: regular rate and rhythm, no murmurs  Neurologic Exam Mental Status: Awake and fully alert. Oriented to place and time. Follows all commands. Speech and language normal.   Cranial Nerves: Fundoscopic exam reveals sharp disc margins. Pupils equal, briskly reactive to light. Extraocular movements full without nystagmus. Visual fields full to confrontation. Hearing intact and symmetric to finger snap. Facial sensation intact. Face, tongue, palate move normally and symmetrically. Neck flexion and extension normal.  Motor: Normal bulk and tone. Normal strength in all tested extremity muscles.No focal weakness Sensory.: intact to touch and pinprick and vibratory.  Coordination: Rapid alternating movements normal in all extremities. Finger-to-nose and heel-to-shin performed accurately bilaterally. No dysmetria Gait and Station: Arises from chair without difficulty. Stance is normal.  Able to heel, toe and tandem walk without difficulty.  Reflexes: 2+ and symmetric. Toes downgoing.     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 Vascular risk factors include hypertension, hyperlipidemia, diabetes and obesity.     PLAN: Continue aspirin 325 daily for secondary stroke prevention Maintaining strict control of hypertension with the goal 130/80 or below Control of lipids with LDL below 70% Diabetes goals hemoglobin A1c below 6.5 Healthy diet and moderate exercise and weight loss Repeat carotid Doppler next visit Followup in 6 months  Dennie Bible, GNP-BC APRN

## 2013-07-20 NOTE — Patient Instructions (Addendum)
Continue aspirin 325 daily for secondary stroke prevention Maintaining strict control of hypertension with the goal 130/80 or below Control of lipids with LDL below 70% Diabetes goals hemoglobin A1c below 6.5 Healthy diet and moderate exercise and weight loss Repeat carotid Doppler next visit Followup in 6 months

## 2014-01-18 ENCOUNTER — Encounter: Payer: Self-pay | Admitting: Nurse Practitioner

## 2014-01-18 ENCOUNTER — Encounter (INDEPENDENT_AMBULATORY_CARE_PROVIDER_SITE_OTHER): Payer: Self-pay

## 2014-01-18 ENCOUNTER — Ambulatory Visit (INDEPENDENT_AMBULATORY_CARE_PROVIDER_SITE_OTHER): Payer: Managed Care, Other (non HMO) | Admitting: Nurse Practitioner

## 2014-01-18 VITALS — BP 138/78 | HR 56 | Ht 69.0 in | Wt 258.0 lb

## 2014-01-18 DIAGNOSIS — E119 Type 2 diabetes mellitus without complications: Secondary | ICD-10-CM

## 2014-01-18 DIAGNOSIS — I6350 Cerebral infarction due to unspecified occlusion or stenosis of unspecified cerebral artery: Secondary | ICD-10-CM

## 2014-01-18 DIAGNOSIS — I10 Essential (primary) hypertension: Secondary | ICD-10-CM

## 2014-01-18 NOTE — Progress Notes (Signed)
[facility]  PATIENT: UEL DAVIDOW DOB: February 14, 1951   REASON FOR VISIT: Followup for stroke   HISTORY OF PRESENT ILLNESS: Mr. Krakow, 63 year old male returns for followup. 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 He has vascular risk factors of hypertension, hyperlipidemia, obstructive sleep apnea, diabetes and obesity. He denies further stroke or TIA symptoms. He claims he goes to the gym 3 times a week but he has gained 12 pounds since last seen. He continues to have his lipid profile followed every 3 months by Dr. Earl Gala. He claims he is compliant with his CPAP. He is due for repeat Doppler study. He has no new complaints. He returns for reevaluation.    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 Vascular risk factors include hypertension, hyperlipidemia, newly diagnosed diabetic, and obesity (BMI 34.24). Doing well, no recurrent neurovascular symptoms.  07/20/13: He returns for followup after last visit on 01/20/13. He continues to do well without any recurrent stroke or TIA symptoms. He complains of occasional wobbliness of his gait and orthostasis particularly when he gets up. He had last lipid profile every 3 months by Dr. Earl Gala. His blood pressure today is 130/78. Is trying to lose weight and exercise. He has no new neurologic complaints      REVIEW OF SYSTEMS: Full 14 system review of systems performed and notable only for those listed, all others are neg:  Constitutional: N/A  Cardiovascular: N/A  Ear/Nose/Throat: Occasional nose bleed Skin: N/A  Eyes: N/A  Respiratory: N/A  Gastroitestinal: N/A  Hematology/Lymphatic: N/A  Endocrine: N/A Musculoskeletal:N/A  Allergy/Immunology: N/A  Neurological: Occasional headache  Psychiatric: N/A Sleep obstructive sleep apnea   ALLERGIES: Allergies  Allergen Reactions  . Penicillins Hives    HOME MEDICATIONS: Outpatient  Prescriptions Prior to Visit  Medication Sig Dispense Refill  . ABILIFY 5 MG tablet 2.5 mg.       . amLODipine (NORVASC) 10 MG tablet Take 10 mg by mouth daily.      Marland Kitchen aspirin 325 MG tablet Take 325 mg by mouth daily.      . metFORMIN (GLUCOPHAGE-XR) 500 MG 24 hr tablet Take 1,000 mg by mouth daily with breakfast.      . nebivolol (BYSTOLIC) 5 MG tablet Take 1 tablet (5 mg total) by mouth daily.  30 tablet  0  . pioglitazone (ACTOS) 15 MG tablet Take 15 mg by mouth daily.      . sertraline (ZOLOFT) 100 MG tablet Take 150 mg by mouth daily. Take 2 tabs (300) daily      . traZODone (DESYREL) 50 MG tablet Take 50-100 mg by mouth daily as needed. For sleep      . valsartan-hydrochlorothiazide (DIOVAN-HCT) 160-12.5 MG per tablet Take 1 tablet by mouth daily.       . Vitamin D, Ergocalciferol, (DRISDOL) 50000 UNITS CAPS capsule 50,000 Units every 7 (seven) days.       . simvastatin (ZOCOR) 20 MG tablet Take 1 tablet (20 mg total) by mouth daily at 6 PM.  30 tablet  0   No facility-administered medications prior to visit.    PAST MEDICAL HISTORY: Past Medical History  Diagnosis Date  . Diabetes mellitus   . Hypertension     PAST SURGICAL HISTORY: Past Surgical History  Procedure Laterality Date  . Tonsillectomy    . Anal fissure repair      FAMILY HISTORY: History reviewed. No pertinent family history.  SOCIAL HISTORY:  History   Social History  . Marital Status: Married    Spouse Name: Vernona RiegerLaura     Number of Children: 2  . Years of Education: College    Occupational History  .  [employer]   Social History Main Topics  . Smoking status: Never Smoker   . Smokeless tobacco: Never Used  . Alcohol Use: No  . Drug Use: No  . Sexual Activity: Not on file   Other Topics Concern  . Not on file   Social History Narrative   Patient lives at home with wife Vernona RiegerLaura.    Patient has 2 children.    Patient is currently working.    Patient has a Degree.      PHYSICAL  EXAM  Filed Vitals:   01/18/14 1449  BP: 138/78  Pulse: 56  Height: 5\' 9"  (1.753 m)  Weight: 258 lb (117.028 kg)   Body mass index is 38.08 kg/(m^2).  Generalized: Well developed, obese male in no acute distress  Head: normocephalic and atraumatic,. Oropharynx benign  Neck: Supple, no carotid bruits  Cardiac: Regular rate rhythm, no murmur  Musculoskeletal: No deformity   Neurological examination   Mentation: Alert oriented to time, place, history taking. Follows all commands speech and language fluent  Cranial nerve II-XII: Pupils were equal round reactive to light extraocular movements were full, visual field were full on confrontational test. Facial sensation and strength were normal. hearing was intact to finger rubbing bilaterally. Uvula tongue midline. head turning and shoulder shrug were normal and symmetric.Tongue protrusion into cheek strength was normal. Motor: normal bulk and tone, full strength in the BUE, BLE, fine finger movements normal, no pronator drift. No focal weakness Sensory: normal and symmetric to light touch, pinprick, and  vibration  Coordination: finger-nose-finger, heel-to-shin bilaterally, no dysmetria Reflexes: Brachioradialis 2/2, biceps 2/2, triceps 2/2, patellar 2/2, Achilles 2/2, plantar responses were flexor bilaterally. Gait and Station: Rising up from seated position without assistance, normal stance,  moderate stride, good arm swing, smooth turning, able to perform tiptoe, and heel walking without difficulty. Tandem gait is steady  DIAGNOSTIC DATA (LABS, IMAGING, TESTING)     ASSESSMENT AND PLAN  63 y.o. year old male  has a past medical history of Diabetes mellitus and Hypertension,hyperlipidemia and sleep apnea and obesity here to followup for  stroke which occurred in 2013.  Continue aspirin 325 secondary stroke prevention Strict control of hypertension with the goal 130/80 or below Control of lipids was LDL below 70  moderate exercise,  weight loss, healthy diet Schedule for carotid Doppler Hemoglobin A1c below 6.5 Followup in 6 months Nilda RiggsNancy Carolyn Norton Bivins, [facility] CtrGNP, [facility] CenterBC, APRN  [facility] [address], [address] [place], [place] [id number] [phone]

## 2014-01-18 NOTE — Patient Instructions (Addendum)
Continue aspirin 325 secondary stroke prevention Strict control of hypertension with the goal 130/80 or below Control of lipids was LDL below 70  moderate exercise, weight loss, healthy diet Scheduled for carotid Doppler Hemoglobin A1c below 6.5 Followup in 6 months

## 2014-01-24 ENCOUNTER — Telehealth: Payer: Self-pay | Admitting: Neurology

## 2014-01-24 NOTE — Telephone Encounter (Signed)
Pt called back and relayed that he gets nervous relating to have had 2 strokes.  I told him that he would need to call his pcp today and let them know.  He did then state that he had nosebleed yesterday.  He is on aspirin.  He does not check his Bp on daily basis.  He is not having any other sx.  He verbalized understanding.

## 2014-01-24 NOTE — Telephone Encounter (Signed)
I called pt and LMVM for him on wk # and cell #, that was returning his call.   He needs to call his pcp about his Bp, this could be causing headache, then may want to adjust his medications.

## 2014-01-24 NOTE — Telephone Encounter (Signed)
Patient called to state that since this morning he has been having sharp pains on the sides of his head that continue on and off. Patient states he went to check his blood pressure and it was 210/52. Please call patient and advise using the number listed, his extension is [phone].

## 2014-02-03 ENCOUNTER — Inpatient Hospital Stay (HOSPITAL_COMMUNITY)
Admission: EM | Admit: 2014-02-03 | Discharge: 2014-02-08 | DRG: 062 | Disposition: A | Discharge status: Rehabilitation Facility | Payer: Managed Care, Other (non HMO) | Attending: Neurology | Admitting: Neurology

## 2014-02-03 ENCOUNTER — Encounter (HOSPITAL_COMMUNITY): Payer: Self-pay | Admitting: Emergency Medicine

## 2014-02-03 ENCOUNTER — Inpatient Hospital Stay (HOSPITAL_COMMUNITY): Payer: Managed Care, Other (non HMO)

## 2014-02-03 ENCOUNTER — Emergency Department (HOSPITAL_COMMUNITY): Payer: Managed Care, Other (non HMO)

## 2014-02-03 DIAGNOSIS — IMO0001 Reserved for inherently not codable concepts without codable children: Secondary | ICD-10-CM | POA: Diagnosis present

## 2014-02-03 DIAGNOSIS — G4733 Obstructive sleep apnea (adult) (pediatric): Secondary | ICD-10-CM | POA: Diagnosis present

## 2014-02-03 DIAGNOSIS — Z7982 Long term (current) use of aspirin: Secondary | ICD-10-CM

## 2014-02-03 DIAGNOSIS — R29810 Facial weakness: Secondary | ICD-10-CM | POA: Diagnosis present

## 2014-02-03 DIAGNOSIS — I6330 Cerebral infarction due to thrombosis of unspecified cerebral artery: Principal | ICD-10-CM | POA: Diagnosis present

## 2014-02-03 DIAGNOSIS — E1165 Type 2 diabetes mellitus with hyperglycemia: Secondary | ICD-10-CM

## 2014-02-03 DIAGNOSIS — F329 Major depressive disorder, single episode, unspecified: Secondary | ICD-10-CM

## 2014-02-03 DIAGNOSIS — F32A Depression, unspecified: Secondary | ICD-10-CM

## 2014-02-03 DIAGNOSIS — I6350 Cerebral infarction due to unspecified occlusion or stenosis of unspecified cerebral artery: Secondary | ICD-10-CM

## 2014-02-03 DIAGNOSIS — E785 Hyperlipidemia, unspecified: Secondary | ICD-10-CM | POA: Diagnosis present

## 2014-02-03 DIAGNOSIS — Z8673 Personal history of transient ischemic attack (TIA), and cerebral infarction without residual deficits: Secondary | ICD-10-CM

## 2014-02-03 DIAGNOSIS — F418 Other specified anxiety disorders: Secondary | ICD-10-CM | POA: Diagnosis present

## 2014-02-03 DIAGNOSIS — Z823 Family history of stroke: Secondary | ICD-10-CM

## 2014-02-03 DIAGNOSIS — Z79899 Other long term (current) drug therapy: Secondary | ICD-10-CM

## 2014-02-03 DIAGNOSIS — I639 Cerebral infarction, unspecified: Secondary | ICD-10-CM | POA: Diagnosis present

## 2014-02-03 DIAGNOSIS — I6789 Other cerebrovascular disease: Secondary | ICD-10-CM | POA: Diagnosis present

## 2014-02-03 DIAGNOSIS — E669 Obesity, unspecified: Secondary | ICD-10-CM | POA: Diagnosis present

## 2014-02-03 DIAGNOSIS — I10 Essential (primary) hypertension: Secondary | ICD-10-CM | POA: Diagnosis present

## 2014-02-03 DIAGNOSIS — G8190 Hemiplegia, unspecified affecting unspecified side: Secondary | ICD-10-CM | POA: Diagnosis present

## 2014-02-03 DIAGNOSIS — E119 Type 2 diabetes mellitus without complications: Secondary | ICD-10-CM | POA: Diagnosis present

## 2014-02-03 HISTORY — DX: Obesity, unspecified: E66.9

## 2014-02-03 HISTORY — DX: Obstructive sleep apnea (adult) (pediatric): G47.33

## 2014-02-03 HISTORY — DX: Other specified anxiety disorders: F41.8

## 2014-02-03 HISTORY — DX: Dependence on other enabling machines and devices: Z99.89

## 2014-02-03 HISTORY — DX: Cerebral infarction, unspecified: I63.9

## 2014-02-03 LAB — COMPREHENSIVE METABOLIC PANEL
ALBUMIN: 3.9 g/dL (ref 3.5–5.2)
ALK PHOS: 64 U/L (ref 39–117)
ALT: 35 U/L (ref 0–53)
AST: 29 U/L (ref 0–37)
BILIRUBIN TOTAL: 0.6 mg/dL (ref 0.3–1.2)
BUN: 16 mg/dL (ref 6–23)
CHLORIDE: 102 meq/L (ref 96–112)
CO2: 24 mEq/L (ref 19–32)
Calcium: 9.5 mg/dL (ref 8.4–10.5)
Creatinine, Ser: 1.22 mg/dL (ref 0.50–1.35)
GFR calc Af Amer: 72 mL/min — ABNORMAL LOW (ref >90)
GFR calc non Af Amer: 62 mL/min — ABNORMAL LOW (ref >90)
Glucose, Bld: 278 mg/dL — ABNORMAL HIGH (ref 70–99)
POTASSIUM: 4.1 meq/L (ref 3.7–5.3)
SODIUM: 140 meq/L (ref 137–147)
Total Protein: 7.6 g/dL (ref 6.0–8.3)

## 2014-02-03 LAB — I-STAT CHEM 8, ED
BUN: 17 mg/dL (ref 6–23)
CREATININE: 1.3 mg/dL (ref 0.50–1.35)
Calcium, Ion: 1.16 mmol/L (ref 1.13–1.30)
Chloride: 104 mEq/L (ref 96–112)
Glucose, Bld: 271 mg/dL — ABNORMAL HIGH (ref 70–99)
HCT: 47 % (ref 39.0–52.0)
HEMOGLOBIN: 16 g/dL (ref 13.0–17.0)
POTASSIUM: 4 meq/L (ref 3.7–5.3)
SODIUM: 140 meq/L (ref 137–147)
TCO2: 25 mmol/L (ref 0–100)

## 2014-02-03 LAB — APTT: APTT: 36 s (ref 24–37)

## 2014-02-03 LAB — CBC
HCT: 43 % (ref 39.0–52.0)
HEMOGLOBIN: 15.6 g/dL (ref 13.0–17.0)
MCH: 31.5 pg (ref 26.0–34.0)
MCHC: 36.3 g/dL — AB (ref 30.0–36.0)
MCV: 86.7 fL (ref 78.0–100.0)
PLATELETS: 186 10*3/uL (ref 150–400)
RBC: 4.96 MIL/uL (ref 4.22–5.81)
RDW: 13 % (ref 11.5–15.5)
WBC: 8.6 10*3/uL (ref 4.0–10.5)

## 2014-02-03 LAB — DIFFERENTIAL
BASOS ABS: 0 10*3/uL (ref 0.0–0.1)
BASOS PCT: 0 % (ref 0–1)
Eosinophils Absolute: 0.1 10*3/uL (ref 0.0–0.7)
Eosinophils Relative: 1 % (ref 0–5)
Lymphocytes Relative: 38 % (ref 12–46)
Lymphs Abs: 3.3 10*3/uL (ref 0.7–4.0)
Monocytes Absolute: 0.5 10*3/uL (ref 0.1–1.0)
Monocytes Relative: 5 % (ref 3–12)
NEUTROS ABS: 4.8 10*3/uL (ref 1.7–7.7)
NEUTROS PCT: 55 % (ref 43–77)

## 2014-02-03 LAB — CBG MONITORING, ED: Glucose-Capillary: 278 mg/dL — ABNORMAL HIGH (ref 70–99)

## 2014-02-03 LAB — I-STAT TROPONIN, ED: Troponin i, poc: 0 ng/mL (ref 0.00–0.08)

## 2014-02-03 LAB — PROTIME-INR
INR: 0.98 (ref 0.00–1.49)
PROTHROMBIN TIME: 12.8 s (ref 11.6–15.2)

## 2014-02-03 LAB — MRSA PCR SCREENING: MRSA BY PCR: NEGATIVE

## 2014-02-03 MED ORDER — ACETAMINOPHEN 650 MG RE SUPP: 650.0000 mg | RECTAL | Status: DC | PRN

## 2014-02-03 MED ORDER — INSULIN ASPART 100 UNIT/ML ~~LOC~~ SOLN
2.0000 [IU] | SUBCUTANEOUS | Status: DC
  Administered 2014-02-03: 4 [IU] via SUBCUTANEOUS
  Administered 2014-02-03 – 2014-02-04 (×2): 2 [IU] via SUBCUTANEOUS
  Administered 2014-02-04: 6 [IU] via SUBCUTANEOUS
  Administered 2014-02-04: 4 [IU] via SUBCUTANEOUS
  Administered 2014-02-04: 2 [IU] via SUBCUTANEOUS
  Administered 2014-02-04 – 2014-02-05 (×3): 4 [IU] via SUBCUTANEOUS
  Administered 2014-02-05: 2 [IU] via SUBCUTANEOUS
  Administered 2014-02-05 (×3): 4 [IU] via SUBCUTANEOUS
  Administered 2014-02-06 (×2): 2 [IU] via SUBCUTANEOUS

## 2014-02-03 MED ORDER — ACETAMINOPHEN 325 MG PO TABS
650.0000 mg | ORAL_TABLET | ORAL | Status: DC | PRN
  Filled 2014-02-03: qty 2

## 2014-02-03 MED ORDER — LABETALOL HCL 5 MG/ML IV SOLN
10.0000 mg | Freq: Once | INTRAVENOUS | Status: AC
  Administered 2014-02-03: 10 mg via INTRAVENOUS

## 2014-02-03 MED ORDER — IOHEXOL 350 MG/ML SOLN
50.0000 mL | Freq: Once | INTRAVENOUS | Status: AC | PRN
  Administered 2014-02-03: 50 mL via INTRAVENOUS

## 2014-02-03 MED ORDER — SODIUM CHLORIDE 0.9 % IV SOLN
INTRAVENOUS | Status: DC
  Administered 2014-02-03: 19:00:00 via INTRAVENOUS
  Administered 2014-02-03: 75 mL/h via INTRAVENOUS
  Administered 2014-02-04 – 2014-02-06 (×2): 1000 mL via INTRAVENOUS

## 2014-02-03 MED ORDER — ALTEPLASE (STROKE) FULL DOSE INFUSION
90.0000 mg | Freq: Once | INTRAVENOUS | Status: AC
  Administered 2014-02-03: 90 mg via INTRAVENOUS
  Filled 2014-02-03: qty 90

## 2014-02-03 MED ORDER — PANTOPRAZOLE SODIUM 40 MG IV SOLR
40.0000 mg | Freq: Every day | INTRAVENOUS | Status: DC
  Administered 2014-02-03: 40 mg via INTRAVENOUS
  Filled 2014-02-03 (×2): qty 40

## 2014-02-03 MED ORDER — LABETALOL HCL 5 MG/ML IV SOLN
10.0000 mg | INTRAVENOUS | Status: DC | PRN
  Administered 2014-02-03 (×3): 10 mg via INTRAVENOUS
  Filled 2014-02-03 (×4): qty 4

## 2014-02-03 MED ORDER — NICARDIPINE HCL IN NACL 20-0.86 MG/200ML-% IV SOLN
3.0000 mg/h | Freq: Once | INTRAVENOUS | Status: AC
  Administered 2014-02-03: 5 mg/h via INTRAVENOUS
  Filled 2014-02-03 (×2): qty 200

## 2014-02-03 MED ORDER — NICARDIPINE HCL IN NACL 20-0.86 MG/200ML-% IV SOLN
3.0000 mg/h | INTRAVENOUS | Status: DC
  Administered 2014-02-03 – 2014-02-05 (×4): 3 mg/h via INTRAVENOUS
  Filled 2014-02-03 (×5): qty 200

## 2014-02-03 MED ORDER — LABETALOL HCL 5 MG/ML IV SOLN
10.0000 mg | Freq: Once | INTRAVENOUS | Status: AC
  Administered 2014-02-03: 10 mg via INTRAVENOUS
  Filled 2014-02-03: qty 4

## 2014-02-03 NOTE — ED Notes (Signed)
Woke up today 0730 cooked and ate breakfast. Sitting down at 0840 could not move right arm or right leg.  Called EMS. EMS reported right arm flaccid right facial droop and slurred speech.  Code Stroke called 0935.

## 2014-02-03 NOTE — Progress Notes (Signed)
UR completed.  Kimbree Casanas, RN BSN M[facility] CCM Trauma/Neuro ICU Case Manager [phone]  

## 2014-02-03 NOTE — ED Notes (Signed)
Spoke with pharmacy unable to obtain medication out of pyxis states will send medication STAT to ED.

## 2014-02-03 NOTE — ED Notes (Signed)
CT called stated will be ready for patient in 5 minutes.

## 2014-02-03 NOTE — ED Notes (Signed)
Arrived back to POD A4 Neurologist at bedside. Spoke to patient and family members regarding plan of care. Notified Neurologist of IV infiltrate right AC.

## 2014-02-03 NOTE — ED Notes (Signed)
Verbal order given by greg to place foley cath in patient.

## 2014-02-03 NOTE — Code Documentation (Signed)
63 yo male who was home alone this morning when he noted sudden R side weakness and diff speaking.  He called EMS himself and reported difficulty breathing per the dispatcher. Pt  Says he never had diff breathing but speech was slurred so may have caused confusion.  The pt also called his wife who alerted their son-in-law who is a Agricultural consultant. He talked with people at the scene, informed them of the pt's previous two strokes, and they upgraded to a code stroke. On arrival , the pt was met at the bridge by the stroke team and EDP. He clearly had stroke sx and was emotional. tPA was requested at the bridge for wt of 250# (reported by pt). CT showed no acute changes. Transported to 4A where two IVs were started and BP treated (see note). tPA was delivered to bedside prior to stabilizing BP. Once that was achieved with Labetalol and a Cardene drip, the tPA was started. Infusing with no noticeable complications. VS/neuro cks per doc flowsheet. Pt calmer now, actually joking some. Family at bedside and updated. Handoff done with ED RN, Marya Amsler.

## 2014-02-03 NOTE — H&P (Signed)
Neurology H&P  CC: Right sided weakness  History is obtained from:Patient   HPI: Dakota Hall is a 63 y.o. male with a history of two previous strokes who presents with sudden onset right hemiparesis at 8:40am. After arrival, the patient had severely elevated blood pressure requiring labetalol followed by nicardipine.   CTA did not show a large vessel occlusion.  LKW: 8:40am.  tpa given?: yes.     ROS: A 14 point ROS was performed and is negative except as noted in the HPI.  Past Medical History  Diagnosis Date  . Diabetes mellitus   . Hypertension     Family History: Father - stroke  Social History: Tob: denies  Exam: Current vital signs: There were no vitals taken for this visit. Vital signs in last 24 hours:    General: in bed CV: RRR Mental Status: Patient is awake, alert, oriented to person, place, month, year, and situation. Immediate and remote memory are intact. Patient is able to give a clear and coherent history. No signs of aphasia or neglect +dysarthria Cranial Nerves: II: Visual Fields are full. Pupils are equal, round, and reactive to light.  Discs are difficult to visualize. III,IV, VI: EOMI without ptosis or diploplia.  V: Facial sensation is symmetric to temperature VII: Facial movement is notable for facial droop VIII: hearing is intact to voice X: Uvula elevates symmetrically XI: Shoulder shrug is symmetric. XII: tongue is midline without atrophy or fasciculations.  Motor: Tone is normal. Bulk is normal. 5/5 strength was present on the left, no movement right arm, able to lift leg off bed, but drifts down to bed.  Sensory: Sensation is diminished to pin in right leg.  Deep Tendon Reflexes: 2+ and symmetric in the biceps and patellae.  Plantars: Toes are upgoing bilaterally.  Cerebellar: FNF and HKS are intact on the left Gait: Not assessed due to acute nature of evaluation and multiple medical monitors in ED setting.    I have  reviewed labs in epic and the results pertinent to this consultation are: Normal creatinine.  cmp - elevated glucose  I have reviewed the images obtained:CT head - negaitev  Impression: 63 yo M with likely small subcortical infarct. CTA shows no large vessel occlusion and he is being admitted to the ICU for post TPA care.  Recommendations: 1. HgbA1c, fasting lipid panel 2. MRI, MRA  of the brain without contrast 3. Frequent neuro checks 4. Echocardiogram 5. Carotid dopplers 6. Prophylactic therapy-None, tpa 7. Risk factor modification 8. Telemetry monitoring 9. PT consult, OT consult, Speech consult 10. Nicardipine drip for BP control. 11. ICU hyperglycemia protocol.    This patient is critically ill and at significant risk of neurological worsening, death and care requires constant monitoring of vital signs, hemodynamics,respiratory and cardiac monitoring, neurological assessment, discussion with family, other specialists and medical decision making of high complexity. I spent 60 minutes of neurocritical care time  in the care of  this patient.  Ritta SlotMcNeill Kameren Pargas, MD [facility] [phone]  If 7pm- 7am, please page neurology on call as listed in AMION. 02/03/2014  7:00 PM

## 2014-02-03 NOTE — ED Notes (Signed)
MRI came to transport patient to MRI. Patient states pain lower back due to being uncomfortable. Paged and spoke with Neurologist. Stated to hold MRI at this time. Reposition patient for comfort states feels much better.

## 2014-02-03 NOTE — ED Provider Notes (Addendum)
CSN: [id number]     Arrival date & time 02/03/14  0941 History   First MD Initiated Contact with Patient 02/03/14 [phone]     Chief Complaint  Patient presents with  . Code Stroke     (Consider location/radiation/quality/duration/timing/severity/associated sxs/prior Treatment) HPI Comments: Patient arrives via EMS with right-sided weakness the onset at 8:40 AM. Is associated with some slurred speech and dense paresis in his right side. Stroke activated by EMS. Patient with previous stroke in the past with no residual deficits. Denies headache, chest pain, abdominal pain, nausea. He is diabetic and has a history of hypertension.  The history is provided by the patient and the EMS personnel. The history is limited by the condition of the patient.    Past Medical History  Diagnosis Date  . Diabetes mellitus   . Hypertension   . Stroke    Past Surgical History  Procedure Laterality Date  . Tonsillectomy    . Anal fissure repair     No family history on file. History  Substance Use Topics  . Smoking status: Never Smoker   . Smokeless tobacco: Never Used  . Alcohol Use: No    Review of Systems  Unable to perform ROS: Acuity of condition      Allergies  Penicillins  Home Medications   No current outpatient prescriptions on file. BP 178/75  Pulse 64  Temp(Src) 99.2 F (37.3 C) (Oral)  Resp 21  Ht 5\' 9"  (1.753 m)  Wt 257 lb 0.9 oz (116.6 kg)  BMI 37.94 kg/m2  SpO2 99% Physical Exam  Constitutional: He is oriented to person, place, and time. He appears well-developed and well-nourished. No distress.  HENT:  Head: Normocephalic and atraumatic.  Mouth/Throat: Oropharynx is clear and moist. No oropharyngeal exudate.  Eyes: Conjunctivae and EOM are normal. Pupils are equal, round, and reactive to light.  Neck: Normal range of motion. Neck supple.  Cardiovascular: Normal rate, regular rhythm and normal heart sounds.   No murmur heard. Pulmonary/Chest: Effort normal and  breath sounds normal. No respiratory distress.  Abdominal: Soft. Bowel sounds are normal. There is no tenderness. There is no rebound and no guarding.  Musculoskeletal: Normal range of motion. He exhibits no edema and no tenderness.  Neurological: He is alert and oriented to person, place, and time. A cranial nerve deficit is present.  Dense right hemiparesis. Unable to hold right arm or right leg off bed. Slurred speech. No aphasia.  Skin: Skin is warm.    ED Course  Procedures (including critical care time) Labs Review Labs Reviewed  CBC - Abnormal; Notable for the following:    MCHC 36.3 (*)    All other components within normal limits  COMPREHENSIVE METABOLIC PANEL - Abnormal; Notable for the following:    Glucose, Bld 278 (*)    GFR calc non Af Amer 62 (*)    GFR calc Af Amer 72 (*)    All other components within normal limits  CBG MONITORING, ED - Abnormal; Notable for the following:    Glucose-Capillary 278 (*)    All other components within normal limits  I-STAT CHEM 8, ED - Abnormal; Notable for the following:    Glucose, Bld 271 (*)    All other components within normal limits  MRSA PCR SCREENING  PROTIME-INR  APTT  DIFFERENTIAL  HEMOGLOBIN A1C  LIPID PANEL  I-STAT TROPOININ, ED   Imaging Review Ct Angio Head W/cm &/or Wo Cm  02/03/2014   CLINICAL DATA:  63 year old male  with right side weakness. Code stroke. Initial encounter.  EXAM: CT ANGIOGRAPHY HEAD AND NECK  TECHNIQUE: Multidetector CT imaging of the head and neck was performed using the standard protocol during bolus administration of intravenous contrast. Multiplanar CT image reconstructions and MIPs were obtained to evaluate the vascular anatomy. Carotid stenosis measurements (when applicable) are obtained utilizing NASCET criteria, using the distal internal carotid diameter as the denominator.  CONTRAST:  50mL OMNIPAQUE IOHEXOL 350 MG/ML SOLN  COMPARISON:  Non contrast head CT 0950 hr the same day.  FINDINGS:  CTA HEAD FINDINGS  No ventriculomegaly. No midline shift, mass effect, or evidence of intracranial mass lesion. No acute intracranial hemorrhage identified. Patchy and confluent bilateral cerebral white matter hypodensity is stable. No evidence of cortically based acute infarction identified.  VASCULAR FINDINGS: Major intracranial venous structures appear normally enhancing.  Mildly dominant and dolichoectatic distal left vertebral artery. Knee patent left PICA origin. Calcified plaque in the distal right vertebral artery without hemodynamically significant stenosis. Patent right PICA origin.  Patent vertebrobasilar junction. Dolichoectatic basilar artery with no stenosis. SCA and right PCA origin are normal. Posterior communicating arteries are diminutive or absent. There is subtle irregularity of the left PCA P1 segment (series 16109, image 86 and series 60454, image 151). Preserved distal left PCA flow, and otherwise the bilateral PCA branches are within normal limits.  Both ICA siphons are patent and moderately dolichoectatic. Mild calcified plaque. No ICA siphon stenosis. Normal ophthalmic artery origins.  Patent carotid termini. Normal MCA and ACA origins. Mildly ectatic bilateral ACA branches. Anterior communicating artery within normal limits. No ACA stenosis identified. Mildly ectatic right MCA M1 segment. Right MCA branches are within normal limits.  Left MCA M1 segment is patent with mild ectasia. Left MCA bifurcation is patent. Left MCA branches are within normal limits.  Review of the MIP images confirms the above findings.  CTA NECK FINDINGS  Mediastinal lipomatosis. No superior mediastinal lymphadenopathy. Mild upper lobe atelectasis. Negative thyroid, larynx, pharynx, parapharyngeal spaces, retropharyngeal space, sublingual space, submandibular glands, parotid glands and orbits.  Upper limits of normal level 2 cervical lymph nodes. Other cervical nodal stations are within normal limits.  No acute  osseous abnormality identified. Visualized paranasal sinuses and mastoids are clear.  VASCULAR FINDINGS: 3 vessel arch configuration. No arch atherosclerosis. Mildly to moderately tortuous great vessels, mostly on the left side.  No right CCA stenosis. Minimal calcified plaque the right carotid bifurcation in the posterior ICA bulb. No cervical right ICA stenosis. Moderate tortuosity of the cervical right ICA.  No proximal right subclavian artery stenosis. Normal right vertebral artery origin. Intermittently tortuous but otherwise negative cervical right vertebral artery.  Tortuous proximal left CCA. Mild calcified plaque at the left carotid bifurcation. No stenosis. Moderately tortuous cervical left ICA without stenosis.  No proximal left subclavian artery stenosis. Normal left vertebral artery origin. Tortuous mildly dolichoectatic proximal left vertebral artery. The left vertebral artery is mildly dominant throughout. It demonstrates no stenosis to the skullbase.  Review of the MIP images confirms the above findings.  IMPRESSION: 1. 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 No arterial stenosis in the neck. No major circle of Willis branch occlusion. 2. Mild irregularity of the left PCA P1 segment. Consider left thalamostriate type small vessel ischemia in this setting. 3. Stable CT appearance of the brain. No evidence of cortically based acute infarction identified. Salient findings on this study reviewed in person with Dr. Ritta Slot on 02/03/2014 at 1141 hr .   Electronically Signed  By: Augusto Gamble M.D.   On: 02/03/2014 12:12   Ct Head (brain) Wo Contrast  02/03/2014   CLINICAL DATA:  Right-sided weakness and dysarthria  EXAM: CT HEAD WITHOUT CONTRAST  TECHNIQUE: Contiguous axial images were obtained from the base of the skull through the vertex without intravenous contrast. Study was obtained within 24 hr of patient's arrival at the emergency department.  COMPARISON:  Brain CT  May 15, 2012 and brain MRI May 16, 2012  FINDINGS: There is mild diffuse atrophy. There is no mass, hemorrhage, extra-axial fluid collection, or midline shift. There is extensive small vessel disease throughout the centra semiovale bilaterally, stable. There is evidence of prior infarct in the left pons, stable. There is no new gray-white compartment lesion. Bony calvarium appears intact. The mastoid air cells are clear.  Both middle cerebral arteries show equal mild increased attenuation, stable. The basilar artery is tortuous with increased attenuation, stable from prior study.  IMPRESSION: Atrophy with extensive supratentorial small vessel disease as well as prior infarct in the left pons. No acute appearing infarct seen. No hemorrhage or mass effect. Major intracranial vessels shows some increased attenuation, stable from prior study. The significance of this finding is questionable.  Critical Value/emergent results were called by telephone at the time of interpretation on 02/03/2014 at 10:01 AM to Dr. Amada Jupiter, Neurology, who verbally acknowledged these results.   Electronically Signed   By: Bretta Bang M.D.   On: 02/03/2014 10:03   Ct Angio Neck W/cm &/or Wo/cm  02/03/2014   CLINICAL DATA:  63 year old male with right side weakness. Code stroke. Initial encounter.  EXAM: CT ANGIOGRAPHY HEAD AND NECK  TECHNIQUE: Multidetector CT imaging of the head and neck was performed using the standard protocol during bolus administration of intravenous contrast. Multiplanar CT image reconstructions and MIPs were obtained to evaluate the vascular anatomy. Carotid stenosis measurements (when applicable) are obtained utilizing NASCET criteria, using the distal internal carotid diameter as the denominator.  CONTRAST:  50mL OMNIPAQUE IOHEXOL 350 MG/ML SOLN  COMPARISON:  Non contrast head CT 0950 hr the same day.  FINDINGS: CTA HEAD FINDINGS  No ventriculomegaly. No midline shift, mass effect, or evidence of  intracranial mass lesion. No acute intracranial hemorrhage identified. Patchy and confluent bilateral cerebral white matter hypodensity is stable. No evidence of cortically based acute infarction identified.  VASCULAR FINDINGS: Major intracranial venous structures appear normally enhancing.  Mildly dominant and dolichoectatic distal left vertebral artery. Knee patent left PICA origin. Calcified plaque in the distal right vertebral artery without hemodynamically significant stenosis. Patent right PICA origin.  Patent vertebrobasilar junction. Dolichoectatic basilar artery with no stenosis. SCA and right PCA origin are normal. Posterior communicating arteries are diminutive or absent. There is subtle irregularity of the left PCA P1 segment (series 91478, image 86 and series 29562, image 151). Preserved distal left PCA flow, and otherwise the bilateral PCA branches are within normal limits.  Both ICA siphons are patent and moderately dolichoectatic. Mild calcified plaque. No ICA siphon stenosis. Normal ophthalmic artery origins.  Patent carotid termini. Normal MCA and ACA origins. Mildly ectatic bilateral ACA branches. Anterior communicating artery within normal limits. No ACA stenosis identified. Mildly ectatic right MCA M1 segment. Right MCA branches are within normal limits.  Left MCA M1 segment is patent with mild ectasia. Left MCA bifurcation is patent. Left MCA branches are within normal limits.  Review of the MIP images confirms the above findings.  CTA NECK FINDINGS  Mediastinal lipomatosis. No superior mediastinal lymphadenopathy. Mild  upper lobe atelectasis. Negative thyroid, larynx, pharynx, parapharyngeal spaces, retropharyngeal space, sublingual space, submandibular glands, parotid glands and orbits.  Upper limits of normal level 2 cervical lymph nodes. Other cervical nodal stations are within normal limits.  No acute osseous abnormality identified. Visualized paranasal sinuses and mastoids are clear.   VASCULAR FINDINGS: 3 vessel arch configuration. No arch atherosclerosis. Mildly to moderately tortuous great vessels, mostly on the left side.  No right CCA stenosis. Minimal calcified plaque the right carotid bifurcation in the posterior ICA bulb. No cervical right ICA stenosis. Moderate tortuosity of the cervical right ICA.  No proximal right subclavian artery stenosis. Normal right vertebral artery origin. Intermittently tortuous but otherwise negative cervical right vertebral artery.  Tortuous proximal left CCA. Mild calcified plaque at the left carotid bifurcation. No stenosis. Moderately tortuous cervical left ICA without stenosis.  No proximal left subclavian artery stenosis. Normal left vertebral artery origin. Tortuous mildly dolichoectatic proximal left vertebral artery. The left vertebral artery is mildly dominant throughout. It demonstrates no stenosis to the skullbase.  Review of the MIP images confirms the above findings.  IMPRESSION: 1. 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 No arterial stenosis in the neck. No major circle of Willis branch occlusion. 2. Mild irregularity of the left PCA P1 segment. Consider left thalamostriate type small vessel ischemia in this setting. 3. Stable CT appearance of the brain. No evidence of cortically based acute infarction identified. Salient findings on this study reviewed in person with Dr. Ritta SlotMCNEILL KIRKPATRICK on 02/03/2014 at 1141 hr .   Electronically Signed   By: Augusto GambleLee  Hall M.D.   On: 02/03/2014 12:12     EKG Interpretation   Date/Time:  Friday February 03 2014 10:13:50 EDT Ventricular Rate:  65 PR Interval:  169 QRS Duration: 93 QT Interval:  423 QTC Calculation: 440 R Axis:   60 Text Interpretation:  Sinus rhythm Probable anteroseptal infarct, old No  significant change was found Confirmed by Manus GunningANCOUR  MD, Osker Ayoub [phone]([id number]) on  02/03/2014 10:35:42 AM      MDM   Final diagnoses:  CVA (cerebral infarction)   Right-sided  weakness onset 8:40 AM. Code stroke called by EMS. CT scan shows no hemorrhage.  Patient with severe right-sided hemiparesis and slurred speech. He is a candidate for tPA per neurology. BP 200/80.  IV labetalol ordered prior to tPA administration per neurology.  Neurology to admit to ICU.  Patient protecting airway, no distress throughout ED course.  CRITICAL CARE Performed by: Glynn OctaveANCOUR, Joette Schmoker Total critical care time: 30 Critical care time was exclusive of separately billable procedures and treating other patients. Critical care was necessary to treat or prevent imminent or life-threatening deterioration. Critical care was time spent personally by me on the following activities: development of treatment plan with patient and/or surrogate as well as nursing, discussions with consultants, evaluation of patient's response to treatment, examination of patient, obtaining history from patient or surrogate, ordering and performing treatments and interventions, ordering and review of laboratory studies, ordering and review of radiographic studies, pulse oximetry and re-evaluation of patient's condition.   Glynn OctaveStephen Ekam Besson, MD 02/03/14 Nida [id number]  Glynn OctaveStephen Singleton Hickox, MD 02/04/14 [phone]

## 2014-02-03 NOTE — ED Notes (Signed)
Spoke with EDP about bed order stated to speak with Neurology.  Spoke with Neurology and said will place admit orders in.

## 2014-02-03 NOTE — ED Notes (Signed)
Dakota Hall multiple family members praying with patient.

## 2014-02-03 NOTE — ED Notes (Signed)
Rapid response at bedside after patient reposition bed elevated patient unable lift right leg head of bed lowered patient able to lift right leg.  Currently position supine.

## 2014-02-03 NOTE — ED Notes (Signed)
EKG completed given to EDP 1013

## 2014-02-04 ENCOUNTER — Inpatient Hospital Stay (HOSPITAL_COMMUNITY): Payer: Managed Care, Other (non HMO)

## 2014-02-04 DIAGNOSIS — I6789 Other cerebrovascular disease: Secondary | ICD-10-CM

## 2014-02-04 LAB — GLUCOSE, CAPILLARY
GLUCOSE-CAPILLARY: 138 mg/dL — AB (ref 70–99)
GLUCOSE-CAPILLARY: 141 mg/dL — AB (ref 70–99)
GLUCOSE-CAPILLARY: 177 mg/dL — AB (ref 70–99)
Glucose-Capillary: 150 mg/dL — ABNORMAL HIGH (ref 70–99)
Glucose-Capillary: 167 mg/dL — ABNORMAL HIGH (ref 70–99)
Glucose-Capillary: 216 mg/dL — ABNORMAL HIGH (ref 70–99)

## 2014-02-04 LAB — HEMOGLOBIN A1C
Hgb A1c MFr Bld: 8.2 % — ABNORMAL HIGH (ref <5.7)
Mean Plasma Glucose: 189 mg/dL — ABNORMAL HIGH (ref <117)

## 2014-02-04 LAB — LIPID PANEL
Cholesterol: 177 mg/dL (ref 0–200)
HDL: 45 mg/dL (ref >39)
LDL Cholesterol: 113 mg/dL — ABNORMAL HIGH (ref 0–99)
Total CHOL/HDL Ratio: 3.9 RATIO
Triglycerides: 94 mg/dL (ref <150)
VLDL: 19 mg/dL (ref 0–40)

## 2014-02-04 MED ORDER — SERTRALINE HCL 100 MG PO TABS
300.0000 mg | ORAL_TABLET | Freq: Every day | ORAL | Status: DC
  Administered 2014-02-04 – 2014-02-05 (×2): 300 mg via ORAL
  Filled 2014-02-04 (×3): qty 3

## 2014-02-04 MED ORDER — CLOPIDOGREL BISULFATE 75 MG PO TABS
75.0000 mg | ORAL_TABLET | Freq: Every day | ORAL | Status: DC
  Administered 2014-02-04 – 2014-02-08 (×5): 75 mg via ORAL
  Filled 2014-02-04 (×5): qty 1

## 2014-02-04 MED ORDER — HEPARIN SODIUM (PORCINE) 5000 UNIT/ML IJ SOLN
5000.0000 [IU] | Freq: Three times a day (TID) | INTRAMUSCULAR | Status: DC
  Administered 2014-02-04 – 2014-02-06 (×5): 5000 [IU] via SUBCUTANEOUS
  Filled 2014-02-04 (×9): qty 1

## 2014-02-04 MED ORDER — PANTOPRAZOLE SODIUM 40 MG PO TBEC
40.0000 mg | DELAYED_RELEASE_TABLET | Freq: Every day | ORAL | Status: DC
  Administered 2014-02-04 – 2014-02-07 (×4): 40 mg via ORAL
  Filled 2014-02-04 (×5): qty 1

## 2014-02-04 MED ORDER — IRBESARTAN 150 MG PO TABS: 150.0000 mg | ORAL_TABLET | Freq: Every day | ORAL | Status: DC

## 2014-02-04 NOTE — Progress Notes (Signed)
  Echocardiogram 2D Echocardiogram has been performed.  Levone Otten FRANCES 02/04/2014, 10:23 AM

## 2014-02-04 NOTE — Progress Notes (Signed)
*  PRELIMINARY RESULTS* Vascular Ultrasound Carotid Duplex (Doppler) has been completed.  Preliminary findings: Bilateral:  1-39% ICA stenosis.  Vertebral artery flow is antegrade.     Farrel DemarkJill Eunice, RDMS, RVT  02/04/2014, 2:08 PM

## 2014-02-04 NOTE — Evaluation (Signed)
Speech Language Pathology Evaluation Patient Details Name: Dakota Hall MRN: [id number] DOB: 05/04/1951 Today's Date: 02/04/2014 Time:  -     Problem List:  Patient Active Problem List   Diagnosis Date Noted  . Stroke 02/03/2014  . Unspecified cerebral artery occlusion with cerebral infarction 07/20/2013  . Stroke, small vessel 05/18/2012  . Slurred speech 05/15/2012  . HTN (hypertension) 05/15/2012  . Noncompliance with medication regimen 05/15/2012  . DM (diabetes mellitus) 05/15/2012  . Vision changes 05/15/2012   Past Medical History:  Past Medical History  Diagnosis Date  . Diabetes mellitus   . Hypertension   . Stroke    Past Surgical History:  Past Surgical History  Procedure Laterality Date  . Tonsillectomy    . Anal fissure repair     HPI:  63 y.o. male with a history of HTN, DM, two previous strokes who presents with sudden onset right hemiparesis at 8:40am. CXR No interval change. Cardiomegaly and left lower lobe atelectasis.  CT small vessel disease as well as prior infarct in the left pons. No acute appearing infarct seen.   Assessment / Plan / Recommendation Clinical Impression  Pt. demonstrating moderate dysarthria impacting respiratory, articulatory and respiratory processes decreasing intelligibility in conversation.  Basic cognition appears intact.  SLP will continue diagnostic therapy for complex cognitive abilities and treat dysarthria.    SLP Assessment  Patient needs continued Speech Lanaguage Pathology Services    Follow Up Recommendations  Inpatient Rehab    Frequency and Duration min 2x/week  2 weeks   Pertinent Vitals/Pain WDL   SLP Goals  SLP Goals Potential to Achieve Goals: Good  SLP Evaluation Prior Functioning  Cognitive/Linguistic Baseline: Within functional limits (no family to confirm)  Lives With: Spouse Vocation: Retired   IT consultantCognition  Overall Cognitive Status:  (may have higher level deficits, will continue to  ToysRusassesss) Arousal/Alertness: Awake/alert Orientation Level: Oriented X4 Attention: Sustained Sustained Attention: Appears intact Memory:  (TBA) Awareness: Appears intact Problem Solving:  (TBA further) Safety/Judgment: Appears intact    Comprehension  Auditory Comprehension Overall Auditory Comprehension: Appears within functional limits for tasks assessed Visual Recognition/Discrimination Discrimination: Not tested Reading Comprehension Reading Status:  (TBA)    Expression Expression Primary Mode of Expression: Verbal Verbal Expression Overall Verbal Expression: Appears within functional limits for tasks assessed Initiation: No impairment Level of Generative/Spontaneous Verbalization: Conversation Repetition: No impairment Naming: No impairment Pragmatics: No impairment Written Expression Dominant Hand: Right Written Expression:  (TBA)   Oral / Motor Oral Motor/Sensory Function Overall Oral Motor/Sensory Function: Impaired Labial ROM: Reduced right Labial Symmetry: Abnormal symmetry right Labial Strength: Reduced Lingual ROM: Reduced right;Reduced left Lingual Symmetry: Abnormal symmetry right Lingual Strength: Reduced Facial ROM: Reduced right Motor Speech Overall Motor Speech: Impaired Respiration: Impaired Level of Impairment: Conversation Phonation: Low vocal intensity Resonance: Hypernasality (will assess in diagnostic therapy) Articulation: Impaired Level of Impairment: Conversation Intelligibility: Intelligibility reduced Word: 75-100% accurate Phrase: 75-100% accurate Sentence: 75-100% accurate Conversation: 50-74% accurate Motor Planning: Witnin functional limits Effective Techniques: Over-articulate   GO     Breck CoonsLisa Willis Donelda Mailhot M.Ed [employer]SLP Pager [phone]  02/04/2014

## 2014-02-04 NOTE — Evaluation (Signed)
Clinical/Bedside Swallow Evaluation Patient Details  Name: Dakota Hall MRN: [id number] Date of Birth: 09/06/1951  Today's Date: 02/04/2014 Time: [id number] SLP Time Calculation (min): 16 min  Past Medical History:  Past Medical History  Diagnosis Date  . Diabetes mellitus   . Hypertension   . Stroke    Past Surgical History:  Past Surgical History  Procedure Laterality Date  . Tonsillectomy    . Anal fissure repair     HPI:  63 y.o. male with a history of HTN, DM, two previous strokes who presents with sudden onset right hemiparesis at 8:40am. CXR No interval change. Cardiomegaly and left lower lobe atelectasis.  CT small vessel disease as well as prior infarct in the left pons. No acute appearing infarct seen.   Assessment / Plan / Recommendation Clinical Impression  Pt. not exhibiting overt indications of pharyngeal dysphagia during assessment.  Potential for oral pocketing due to right labial and buccal cavity weakness and decreased oral cohesion may lead to premature spill and penetration/aspiration if large sip consumed.  SLP recommends Dys 3 diet texture and thin liquids, pills whole in applesauce, no straws, small sips and continued dysphagia treatment with ST.      Aspiration Risk  Moderate    Diet Recommendation Dysphagia 3 (Mechanical Soft);Thin liquid   Liquid Administration via: Cup;No straw Medication Administration: Whole meds with puree Supervision: Patient able to self feed;Full supervision/cueing for compensatory strategies Compensations: Slow rate;Small sips/bites;Check for pocketing Postural Changes and/or Swallow Maneuvers: Seated upright 90 degrees;Upright 30-60 min after meal    Other  Recommendations Oral Care Recommendations: Oral care BID   Follow Up Recommendations   (TBD)    Frequency and Duration min 2x/week  2 weeks   Pertinent Vitals/Pain WDL         Swallow Study        Oral/Motor/Sensory Function Overall Oral Motor/Sensory  Function: Impaired Labial ROM: Reduced right Labial Symmetry: Abnormal symmetry right Labial Strength: Reduced Lingual ROM: Reduced right;Reduced left Lingual Symmetry: Abnormal symmetry right Lingual Strength: Reduced Facial ROM: Reduced right Velum: Within Functional Limits Mandible: Within Functional Limits   Ice Chips Ice chips: Within functional limits   Thin Liquid Thin Liquid: Impaired Presentation: Cup;Spoon;Straw Pharyngeal  Phase Impairments: Suspected delayed Swallow    Nectar Thick Nectar Thick Liquid: Not tested   Honey Thick Honey Thick Liquid: Not tested   Puree Puree: Within functional limits   Solid       Solid: Within functional limits       Dakota Hall M.Ed [employer]-SLP Pager [phone]  02/04/2014

## 2014-02-04 NOTE — Progress Notes (Signed)
Patient wanted to use nasal Cannula last night instead of CPAP mask. Patient tolerated ok and had to have some O2 this Am. No distress noted just low SATs intermittently. Will continue to monitor.

## 2014-02-04 NOTE — Progress Notes (Signed)
Stroke Team Progress Note  HISTORY  63 y.o. male with a history of two previous strokes who presents with sudden onset right hemiparesis at 8:40am. After arrival, the patient had severely elevated blood pressure requiring labetalol followed by nicardipine.  He has hx of DM and HTN, states well controlled. On ASA 325 prior to admission. CTA no large vessel occulusion.    SUBJECTIVE Passed swallow eval for dysphagia 3  OBJECTIVE Most recent Vital Signs: Filed Vitals:   02/04/14 0800 02/04/14 0900 02/04/14 0909 02/04/14 1000  BP: 165/61 181/71  171/78  Pulse: 70 71  69  Temp:   98.1 F (36.7 C)   TempSrc:   Axillary   Resp: 23   16  Height:      Weight:      SpO2: 96% 96%  97%   CBG (last 3)   Recent Labs  02/03/14 2317 02/04/14 0414 02/04/14 0912  GLUCAP 150* 138* 167*    IV Fluid Intake:   . sodium chloride 75 mL/hr at 02/03/14 1843  . niCARDipine 3 mg/hr (02/04/14 0451)    MEDICATIONS  . insulin aspart  2-6 Units Subcutaneous 6 times per day  . pantoprazole (PROTONIX) IV  40 mg Intravenous QHS   PRN:  acetaminophen, acetaminophen, labetalol  Diet:  Dysphagia  Activity:  Bedrest DVT Prophylaxis:  scds  CLINICALLY SIGNIFICANT STUDIES Basic Metabolic Panel:  Recent Labs Lab 02/03/14 0944 02/03/14 0958  NA 140 140  K 4.1 4.0  CL 102 104  CO2 24  --   GLUCOSE 278* 271*  BUN 16 17  CREATININE 1.22 1.30  CALCIUM 9.5  --    Liver Function Tests:  Recent Labs Lab 02/03/14 0944  AST 29  ALT 35  ALKPHOS 64  BILITOT 0.6  PROT 7.6  ALBUMIN 3.9   CBC:  Recent Labs Lab 02/03/14 0944 02/03/14 0958  WBC 8.6  --   NEUTROABS 4.8  --   HGB 15.6 16.0  HCT 43.0 47.0  MCV 86.7  --   PLT 186  --    Coagulation:  Recent Labs Lab 02/03/14 0944  LABPROT 12.8  INR 0.98   Cardiac Enzymes: No results found for this basename: CKTOTAL, CKMB, CKMBINDEX, TROPONINI,  in the last 168 hours Urinalysis: No results found for this basename: COLORURINE,  APPERANCEUR, LABSPEC, PHURINE, GLUCOSEU, HGBUR, BILIRUBINUR, KETONESUR, PROTEINUR, UROBILINOGEN, NITRITE, LEUKOCYTESUR,  in the last 168 hours Lipid Panel    Component Value Date/Time   CHOL 177 02/04/2014 0243   TRIG 94 02/04/2014 0243   HDL 45 02/04/2014 0243   CHOLHDL 3.9 02/04/2014 0243   VLDL 19 02/04/2014 0243   LDLCALC 113* 02/04/2014 0243   HgbA1C  Lab Results  Component Value Date   HGBA1C 10.8* 05/16/2012    Urine Drug Screen:     Component Value Date/Time   LABOPIA NONE DETECTED 05/15/2012 2245   COCAINSCRNUR NONE DETECTED 05/15/2012 2245   LABBENZ NONE DETECTED 05/15/2012 2245   AMPHETMU NONE DETECTED 05/15/2012 2245   THCU NONE DETECTED 05/15/2012 2245   LABBARB NONE DETECTED 05/15/2012 2245    Alcohol Level: No results found for this basename: ETH,  in the last 168 hours  Ct Angio Head W/cm &/or Wo Cm  02/03/2014   CLINICAL DATA:  63 year old male with right side weakness. Code stroke. Initial encounter.  EXAM: CT ANGIOGRAPHY HEAD AND NECK  TECHNIQUE: Multidetector CT imaging of the head and neck was performed using the standard protocol during bolus administration of intravenous contrast.  Multiplanar CT image reconstructions and MIPs were obtained to evaluate the vascular anatomy. Carotid stenosis measurements (when applicable) are obtained utilizing NASCET criteria, using the distal internal carotid diameter as the denominator.  CONTRAST:  50mL OMNIPAQUE IOHEXOL 350 MG/ML SOLN  COMPARISON:  Non contrast head CT 0950 hr the same day.  FINDINGS: CTA HEAD FINDINGS  No ventriculomegaly. No midline shift, mass effect, or evidence of intracranial mass lesion. No acute intracranial hemorrhage identified. Patchy and confluent bilateral cerebral white matter hypodensity is stable. No evidence of cortically based acute infarction identified.  VASCULAR FINDINGS: Major intracranial venous structures appear normally enhancing.  Mildly dominant and dolichoectatic distal left vertebral artery. Knee  patent left PICA origin. Calcified plaque in the distal right vertebral artery without hemodynamically significant stenosis. Patent right PICA origin.  Patent vertebrobasilar junction. Dolichoectatic basilar artery with no stenosis. SCA and right PCA origin are normal. Posterior communicating arteries are diminutive or absent. There is subtle irregularity of the left PCA P1 segment (series [id number], image 86 and series [id number], image 151). Preserved distal left PCA flow, and otherwise the bilateral PCA branches are within normal limits.  Both ICA siphons are patent and moderately dolichoectatic. Mild calcified plaque. No ICA siphon stenosis. Normal ophthalmic artery origins.  Patent carotid termini. Normal MCA and ACA origins. Mildly ectatic bilateral ACA branches. Anterior communicating artery within normal limits. No ACA stenosis identified. Mildly ectatic right MCA M1 segment. Right MCA branches are within normal limits.  Left MCA M1 segment is patent with mild ectasia. Left MCA bifurcation is patent. Left MCA branches are within normal limits.  Review of the MIP images confirms the above findings.  CTA NECK FINDINGS  Mediastinal lipomatosis. No superior mediastinal lymphadenopathy. Mild upper lobe atelectasis. Negative thyroid, larynx, pharynx, parapharyngeal spaces, retropharyngeal space, sublingual space, submandibular glands, parotid glands and orbits.  Upper limits of normal level 2 cervical lymph nodes. Other cervical nodal stations are within normal limits.  No acute osseous abnormality identified. Visualized paranasal sinuses and mastoids are clear.  VASCULAR FINDINGS: 3 vessel arch configuration. No arch atherosclerosis. Mildly to moderately tortuous great vessels, mostly on the left side.  No right CCA stenosis. Minimal calcified plaque the right carotid bifurcation in the posterior ICA bulb. No cervical right ICA stenosis. Moderate tortuosity of the cervical right ICA.  No proximal right subclavian artery  stenosis. Normal right vertebral artery origin. Intermittently tortuous but otherwise negative cervical right vertebral artery.  Tortuous proximal left CCA. Mild calcified plaque at the left carotid bifurcation. No stenosis. Moderately tortuous cervical left ICA without stenosis.  No proximal left subclavian artery stenosis. Normal left vertebral artery origin. Tortuous mildly dolichoectatic proximal left vertebral artery. The left vertebral artery is mildly dominant throughout. It demonstrates no stenosis to the skullbase.  Review of the MIP images confirms the above findings.  IMPRESSION: 1. 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 No arterial stenosis in the neck. No major circle of Willis branch occlusion. 2. Mild irregularity of the left PCA P1 segment. Consider left thalamostriate type small vessel ischemia in this setting. 3. Stable CT appearance of the brain. No evidence of cortically based acute infarction identified. Salient findings on this study reviewed in person with Dr. Ritta Slot on 02/03/2014 at 1141 hr .   Electronically Signed   By: Augusto Gamble M.D.   On: 02/03/2014 12:12   Ct Head (brain) Wo Contrast  02/03/2014   CLINICAL DATA:  Right-sided weakness and dysarthria  EXAM: CT HEAD WITHOUT CONTRAST  TECHNIQUE: Contiguous  axial images were obtained from the base of the skull through the vertex without intravenous contrast. Study was obtained within 24 hr of patient's arrival at the emergency department.  COMPARISON:  Brain CT May 15, 2012 and brain MRI May 16, 2012  FINDINGS: There is mild diffuse atrophy. There is no mass, hemorrhage, extra-axial fluid collection, or midline shift. There is extensive small vessel disease throughout the centra semiovale bilaterally, stable. There is evidence of prior infarct in the left pons, stable. There is no new gray-white compartment lesion. Bony calvarium appears intact. The mastoid air cells are clear.  Both middle cerebral  arteries show equal mild increased attenuation, stable. The basilar artery is tortuous with increased attenuation, stable from prior study.  IMPRESSION: Atrophy with extensive supratentorial small vessel disease as well as prior infarct in the left pons. No acute appearing infarct seen. No hemorrhage or mass effect. Major intracranial vessels shows some increased attenuation, stable from prior study. The significance of this finding is questionable.  Critical Value/emergent results were called by telephone at the time of interpretation on 02/03/2014 at 10:01 AM to Dr. Amada JupiterKirkpatrick, Neurology, who verbally acknowledged these results.   Electronically Signed   By: Bretta BangWilliam  Woodruff M.D.   On: 02/03/2014 10:03   Ct Angio Neck W/cm &/or Wo/cm  02/03/2014   CLINICAL DATA:  63 year old male with right side weakness. Code stroke. Initial encounter.  EXAM: CT ANGIOGRAPHY HEAD AND NECK  TECHNIQUE: Multidetector CT imaging of the head and neck was performed using the standard protocol during bolus administration of intravenous contrast. Multiplanar CT image reconstructions and MIPs were obtained to evaluate the vascular anatomy. Carotid stenosis measurements (when applicable) are obtained utilizing NASCET criteria, using the distal internal carotid diameter as the denominator.  CONTRAST:  50mL OMNIPAQUE IOHEXOL 350 MG/ML SOLN  COMPARISON:  Non contrast head CT 0950 hr the same day.  FINDINGS: CTA HEAD FINDINGS  No ventriculomegaly. No midline shift, mass effect, or evidence of intracranial mass lesion. No acute intracranial hemorrhage identified. Patchy and confluent bilateral cerebral white matter hypodensity is stable. No evidence of cortically based acute infarction identified.  VASCULAR FINDINGS: Major intracranial venous structures appear normally enhancing.  Mildly dominant and dolichoectatic distal left vertebral artery. Knee patent left PICA origin. Calcified plaque in the distal right vertebral artery without  hemodynamically significant stenosis. Patent right PICA origin.  Patent vertebrobasilar junction. Dolichoectatic basilar artery with no stenosis. SCA and right PCA origin are normal. Posterior communicating arteries are diminutive or absent. There is subtle irregularity of the left PCA P1 segment (series [id number], image 86 and series [id number], image 151). Preserved distal left PCA flow, and otherwise the bilateral PCA branches are within normal limits.  Both ICA siphons are patent and moderately dolichoectatic. Mild calcified plaque. No ICA siphon stenosis. Normal ophthalmic artery origins.  Patent carotid termini. Normal MCA and ACA origins. Mildly ectatic bilateral ACA branches. Anterior communicating artery within normal limits. No ACA stenosis identified. Mildly ectatic right MCA M1 segment. Right MCA branches are within normal limits.  Left MCA M1 segment is patent with mild ectasia. Left MCA bifurcation is patent. Left MCA branches are within normal limits.  Review of the MIP images confirms the above findings.  CTA NECK FINDINGS  Mediastinal lipomatosis. No superior mediastinal lymphadenopathy. Mild upper lobe atelectasis. Negative thyroid, larynx, pharynx, parapharyngeal spaces, retropharyngeal space, sublingual space, submandibular glands, parotid glands and orbits.  Upper limits of normal level 2 cervical lymph nodes. Other cervical nodal stations are within normal limits.  No acute osseous abnormality identified. Visualized paranasal sinuses and mastoids are clear.  VASCULAR FINDINGS: 3 vessel arch configuration. No arch atherosclerosis. Mildly to moderately tortuous great vessels, mostly on the left side.  No right CCA stenosis. Minimal calcified plaque the right carotid bifurcation in the posterior ICA bulb. No cervical right ICA stenosis. Moderate tortuosity of the cervical right ICA.  No proximal right subclavian artery stenosis. Normal right vertebral artery origin. Intermittently tortuous but otherwise  negative cervical right vertebral artery.  Tortuous proximal left CCA. Mild calcified plaque at the left carotid bifurcation. No stenosis. Moderately tortuous cervical left ICA without stenosis.  No proximal left subclavian artery stenosis. Normal left vertebral artery origin. Tortuous mildly dolichoectatic proximal left vertebral artery. The left vertebral artery is mildly dominant throughout. It demonstrates no stenosis to the skullbase.  Review of the MIP images confirms the above findings.  IMPRESSION: 1. 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 No arterial stenosis in the neck. No major circle of Willis branch occlusion. 2. Mild irregularity of the left PCA P1 segment. Consider left thalamostriate type small vessel ischemia in this setting. 3. Stable CT appearance of the brain. No evidence of cortically based acute infarction identified. Salient findings on this study reviewed in person with Dr. Ritta Slot on 02/03/2014 at 1141 hr .   Electronically Signed   By: Augusto Gamble M.D.   On: 02/03/2014 12:12   Dg Chest Port 1 View  02/03/2014   CLINICAL DATA:  Post tPA  EXAM: PORTABLE CHEST - 1 VIEW  COMPARISON:  DG CHEST 2 VIEW dated 05/15/2012  FINDINGS: Stable cardiac silhouette which is enlarged. There is bibasilar atelectasis greater on the left unchanged. No pulmonary edema.  IMPRESSION: No interval change.  Cardiomegaly and left lower lobe atelectasis.   Electronically Signed   By: Genevive Bi M.D.   On: 02/03/2014 23:17    CT of the brain    MRI of the brain    MRA of the brain    2D Echocardiogram    Carotid Doppler    CXR    EKG  normal EKG, normal sinus rhythm, unchanged from previous tracings. For complete results please see formal report.   Therapy Recommendations pending  Physical Exam   General: in bed  CV: RRR  Mental Status:  Patient is awake, alert, oriented to person, place, month, year, and situation.  Immediate and remote memory are  intact.  Patient is able to give a clear and coherent history.  No signs of aphasia or neglect  +dysarthria  Cranial Nerves:  II: Visual Fields are full. Pupils are equal, round, and reactive to light. Discs are difficult to visualize.  III,IV, VI: EOMI without ptosis or diploplia.  V: Facial sensation is symmetric to temperature  VII: Facial movement is notable for R facial droop  VIII: hearing is intact to voice  X: Uvula elevates symmetrically  XI: Shoulder shrug is symmetric.  XII: tongue is midline without atrophy or fasciculations.  Motor:  Tone is normal. Bulk is normal. 5/5 strength was present on the left, no movement right arm, able to lift leg off bed, but drifts down to bed.  Sensory:  Sensation is diminished to pin in right leg.  Deep Tendon Reflexes:  2+ and symmetric in the biceps and patellae.  Plantars:  Toes are upgoing bilaterally.  Cerebellar:  FNF and HKS are intact on the left  Gait:  Not assessed due to acute nature of evaluation and multiple medical monitors in  ED setting.   ASSESSMENT [name] is a 63 y.o.  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 CTA shows no large vessel occlusion and he is being admitted to the ICU for post TPA care given around 0900.     hba1c pending  LDL 113  htn  dm   Hospital day # 1  TREATMENT/PLAN  MRI at 10:30 This AM, if negative for hemorrhagic conversion can start Plavix 75 daily as failed ASA 325 therapy at home  HbA1c, lipid panel  MRI/MRA  Echo  Carotid doppleres  Pt/ot  Passed speech for dysphagia 3 with thick  Discussed with pt's son and pt at bedside.     Pauletta Browns   02/04/2014 10:26 AM  I have personally obtained a history, examined the patient, evaluated imaging results, and formulated the assessment and plan of care. I agree with the above.    To contact Stroke Continuity provider, please refer to [email or web address]. After hours, contact General Neurology

## 2014-02-05 LAB — GLUCOSE, CAPILLARY
GLUCOSE-CAPILLARY: 176 mg/dL — AB (ref 70–99)
GLUCOSE-CAPILLARY: 124 mg/dL — AB (ref 70–99)
GLUCOSE-CAPILLARY: 164 mg/dL — AB (ref 70–99)
GLUCOSE-CAPILLARY: 158 mg/dL — AB (ref 70–99)
Glucose-Capillary: 154 mg/dL — ABNORMAL HIGH (ref 70–99)
Glucose-Capillary: 178 mg/dL — ABNORMAL HIGH (ref 70–99)
Glucose-Capillary: 184 mg/dL — ABNORMAL HIGH (ref 70–99)

## 2014-02-05 MED ORDER — NEBIVOLOL HCL 2.5 MG PO TABS
2.5000 mg | ORAL_TABLET | Freq: Every day | ORAL | Status: DC
  Administered 2014-02-05: 2.5 mg via ORAL
  Filled 2014-02-05 (×2): qty 1

## 2014-02-05 MED ORDER — NICARDIPINE HCL IN NACL 20-0.86 MG/200ML-% IV SOLN
3.0000 mg/h | INTRAVENOUS | Status: DC
  Administered 2014-02-05 – 2014-02-06 (×2): 3 mg/h via INTRAVENOUS
  Filled 2014-02-05 (×2): qty 200

## 2014-02-05 MED ORDER — SIMVASTATIN 20 MG PO TABS
20.0000 mg | ORAL_TABLET | Freq: Every day | ORAL | Status: DC
  Administered 2014-02-05: 20 mg via ORAL
  Filled 2014-02-05 (×2): qty 1

## 2014-02-05 MED ORDER — LORAZEPAM 1 MG PO TABS
1.0000 mg | ORAL_TABLET | ORAL | Status: DC | PRN
  Administered 2014-02-05: 1 mg via ORAL
  Filled 2014-02-05: qty 1

## 2014-02-05 MED ORDER — SERTRALINE HCL 100 MG PO TABS
100.0000 mg | ORAL_TABLET | Freq: Every day | ORAL | Status: DC
  Filled 2014-02-05 (×2): qty 1

## 2014-02-05 MED ORDER — AMLODIPINE BESYLATE 5 MG PO TABS
5.0000 mg | ORAL_TABLET | Freq: Every day | ORAL | Status: DC
  Administered 2014-02-05: 5 mg via ORAL
  Filled 2014-02-05 (×2): qty 1

## 2014-02-05 NOTE — Progress Notes (Addendum)
Stroke Team Progress Note  HISTORY  63 y.o. male with a history of two previous strokes who presents with sudden onset right hemiparesis at 8:40am. After arrival, the patient had severely elevated blood pressure requiring labetalol followed by nicardipine.  He has hx of DM and HTN, states well controlled. On ASA 325 prior to admission. CTA no large vessel occulusion.    SUBJECTIVE Passed swallow eval for dysphagia 3  OBJECTIVE Most recent Vital Signs: Filed Vitals:   02/05/14 0700 02/05/14 0839 02/05/14 0900 02/05/14 0915  BP: 141/68  162/73   Pulse: 55  64 59  Temp:  97.8 F (36.6 C)    TempSrc:  Oral    Resp: 19  24 23   Height:      Weight:      SpO2: 94%  95% 95%   CBG (last 3)   Recent Labs  02/05/14 0018 02/05/14 0308 02/05/14 0838  GLUCAP 154* 124* 164*    IV Fluid Intake:   . sodium chloride 1,000 mL (02/04/14 2205)  . niCARDipine 3 mg/hr (02/05/14 0529)    MEDICATIONS  . clopidogrel  75 mg Oral Q breakfast  . heparin subcutaneous  5,000 Units Subcutaneous 3 times per day  . insulin aspart  2-6 Units Subcutaneous 6 times per day  . pantoprazole  40 mg Oral QHS  . sertraline  300 mg Oral Daily   PRN:  acetaminophen, acetaminophen, labetalol  Diet:  Dysphagia  Activity:  Bedrest DVT Prophylaxis:  scds  CLINICALLY SIGNIFICANT STUDIES Basic Metabolic Panel:   Recent Labs Lab 02/03/14 0944 02/03/14 0958  NA 140 140  K 4.1 4.0  CL 102 104  CO2 24  --   GLUCOSE 278* 271*  BUN 16 17  CREATININE 1.22 1.30  CALCIUM 9.5  --    Liver Function Tests:   Recent Labs Lab 02/03/14 0944  AST 29  ALT 35  ALKPHOS 64  BILITOT 0.6  PROT 7.6  ALBUMIN 3.9   CBC:   Recent Labs Lab 02/03/14 0944 02/03/14 0958  WBC 8.6  --   NEUTROABS 4.8  --   HGB 15.6 16.0  HCT 43.0 47.0  MCV 86.7  --   PLT 186  --    Coagulation:   Recent Labs Lab 02/03/14 0944  LABPROT 12.8  INR 0.98   Cardiac Enzymes: No results found for this basename: CKTOTAL,  CKMB, CKMBINDEX, TROPONINI,  in the last 168 hours Urinalysis: No results found for this basename: COLORURINE, APPERANCEUR, LABSPEC, PHURINE, GLUCOSEU, HGBUR, BILIRUBINUR, KETONESUR, PROTEINUR, UROBILINOGEN, NITRITE, LEUKOCYTESUR,  in the last 168 hours Lipid Panel    Component Value Date/Time   CHOL 177 02/04/2014 0243   TRIG 94 02/04/2014 0243   HDL 45 02/04/2014 0243   CHOLHDL 3.9 02/04/2014 0243   VLDL 19 02/04/2014 0243   LDLCALC 113* 02/04/2014 0243   HgbA1C  Lab Results  Component Value Date   HGBA1C 8.2* 02/04/2014    Urine Drug Screen:     Component Value Date/Time   LABOPIA NONE DETECTED 05/15/2012 2245   COCAINSCRNUR NONE DETECTED 05/15/2012 2245   LABBENZ NONE DETECTED 05/15/2012 2245   AMPHETMU NONE DETECTED 05/15/2012 2245   THCU NONE DETECTED 05/15/2012 2245   LABBARB NONE DETECTED 05/15/2012 2245    Alcohol Level: No results found for this basename: ETH,  in the last 168 hours  Ct Angio Head W/cm &/or Wo Cm  02/03/2014   CLINICAL DATA:  63 year old male with right side weakness. Code stroke. Initial  encounter.  EXAM: CT ANGIOGRAPHY HEAD AND NECK  TECHNIQUE: Multidetector CT imaging of the head and neck was performed using the standard protocol during bolus administration of intravenous contrast. Multiplanar CT image reconstructions and MIPs were obtained to evaluate the vascular anatomy. Carotid stenosis measurements (when applicable) are obtained utilizing NASCET criteria, using the distal internal carotid diameter as the denominator.  CONTRAST:  50mL OMNIPAQUE IOHEXOL 350 MG/ML SOLN  COMPARISON:  Non contrast head CT 0950 hr the same day.  FINDINGS: CTA HEAD FINDINGS  No ventriculomegaly. No midline shift, mass effect, or evidence of intracranial mass lesion. No acute intracranial hemorrhage identified. Patchy and confluent bilateral cerebral white matter hypodensity is stable. No evidence of cortically based acute infarction identified.  VASCULAR FINDINGS: Major intracranial  venous structures appear normally enhancing.  Mildly dominant and dolichoectatic distal left vertebral artery. Knee patent left PICA origin. Calcified plaque in the distal right vertebral artery without hemodynamically significant stenosis. Patent right PICA origin.  Patent vertebrobasilar junction. Dolichoectatic basilar artery with no stenosis. SCA and right PCA origin are normal. Posterior communicating arteries are diminutive or absent. There is subtle irregularity of the left PCA P1 segment (series 16109, image 86 and series [id number], image 151). Preserved distal left PCA flow, and otherwise the bilateral PCA branches are within normal limits.  Both ICA siphons are patent and moderately dolichoectatic. Mild calcified plaque. No ICA siphon stenosis. Normal ophthalmic artery origins.  Patent carotid termini. Normal MCA and ACA origins. Mildly ectatic bilateral ACA branches. Anterior communicating artery within normal limits. No ACA stenosis identified. Mildly ectatic right MCA M1 segment. Right MCA branches are within normal limits.  Left MCA M1 segment is patent with mild ectasia. Left MCA bifurcation is patent. Left MCA branches are within normal limits.  Review of the MIP images confirms the above findings.  CTA NECK FINDINGS  Mediastinal lipomatosis. No superior mediastinal lymphadenopathy. Mild upper lobe atelectasis. Negative thyroid, larynx, pharynx, parapharyngeal spaces, retropharyngeal space, sublingual space, submandibular glands, parotid glands and orbits.  Upper limits of normal level 2 cervical lymph nodes. Other cervical nodal stations are within normal limits.  No acute osseous abnormality identified. Visualized paranasal sinuses and mastoids are clear.  VASCULAR FINDINGS: 3 vessel arch configuration. No arch atherosclerosis. Mildly to moderately tortuous great vessels, mostly on the left side.  No right CCA stenosis. Minimal calcified plaque the right carotid bifurcation in the posterior ICA bulb.  No cervical right ICA stenosis. Moderate tortuosity of the cervical right ICA.  No proximal right subclavian artery stenosis. Normal right vertebral artery origin. Intermittently tortuous but otherwise negative cervical right vertebral artery.  Tortuous proximal left CCA. Mild calcified plaque at the left carotid bifurcation. No stenosis. Moderately tortuous cervical left ICA without stenosis.  No proximal left subclavian artery stenosis. Normal left vertebral artery origin. Tortuous mildly dolichoectatic proximal left vertebral artery. The left vertebral artery is mildly dominant throughout. It demonstrates no stenosis to the skullbase.  Review of the MIP images confirms the above findings.  IMPRESSION: 1. 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 No arterial stenosis in the neck. No major circle of Willis branch occlusion. 2. Mild irregularity of the left PCA P1 segment. Consider left thalamostriate type small vessel ischemia in this setting. 3. Stable CT appearance of the brain. No evidence of cortically based acute infarction identified. Salient findings on this study reviewed in person with Dr. Ritta Slot on 02/03/2014 at 1141 hr .   Electronically Signed   By: Si Gaul.D.  On: 02/03/2014 12:12   Ct Head (brain) Wo Contrast  02/03/2014   CLINICAL DATA:  Right-sided weakness and dysarthria  EXAM: CT HEAD WITHOUT CONTRAST  TECHNIQUE: Contiguous axial images were obtained from the base of the skull through the vertex without intravenous contrast. Study was obtained within 24 hr of patient's arrival at the emergency department.  COMPARISON:  Brain CT May 15, 2012 and brain MRI May 16, 2012  FINDINGS: There is mild diffuse atrophy. There is no mass, hemorrhage, extra-axial fluid collection, or midline shift. There is extensive small vessel disease throughout the centra semiovale bilaterally, stable. There is evidence of prior infarct in the left pons, stable. There is no new  gray-white compartment lesion. Bony calvarium appears intact. The mastoid air cells are clear.  Both middle cerebral arteries show equal mild increased attenuation, stable. The basilar artery is tortuous with increased attenuation, stable from prior study.  IMPRESSION: Atrophy with extensive supratentorial small vessel disease as well as prior infarct in the left pons. No acute appearing infarct seen. No hemorrhage or mass effect. Major intracranial vessels shows some increased attenuation, stable from prior study. The significance of this finding is questionable.  Critical Value/emergent results were called by telephone at the time of interpretation on 02/03/2014 at 10:01 AM to Dr. Amada Jupiter, Neurology, who verbally acknowledged these results.   Electronically Signed   By: Bretta Bang M.D.   On: 02/03/2014 10:03   Ct Angio Neck W/cm &/or Wo/cm  02/03/2014   CLINICAL DATA:  63 year old male with right side weakness. Code stroke. Initial encounter.  EXAM: CT ANGIOGRAPHY HEAD AND NECK  TECHNIQUE: Multidetector CT imaging of the head and neck was performed using the standard protocol during bolus administration of intravenous contrast. Multiplanar CT image reconstructions and MIPs were obtained to evaluate the vascular anatomy. Carotid stenosis measurements (when applicable) are obtained utilizing NASCET criteria, using the distal internal carotid diameter as the denominator.  CONTRAST:  50mL OMNIPAQUE IOHEXOL 350 MG/ML SOLN  COMPARISON:  Non contrast head CT 0950 hr the same day.  FINDINGS: CTA HEAD FINDINGS  No ventriculomegaly. No midline shift, mass effect, or evidence of intracranial mass lesion. No acute intracranial hemorrhage identified. Patchy and confluent bilateral cerebral white matter hypodensity is stable. No evidence of cortically based acute infarction identified.  VASCULAR FINDINGS: Major intracranial venous structures appear normally enhancing.  Mildly dominant and dolichoectatic distal left  vertebral artery. Knee patent left PICA origin. Calcified plaque in the distal right vertebral artery without hemodynamically significant stenosis. Patent right PICA origin.  Patent vertebrobasilar junction. Dolichoectatic basilar artery with no stenosis. SCA and right PCA origin are normal. Posterior communicating arteries are diminutive or absent. There is subtle irregularity of the left PCA P1 segment (series 95621, image 86 and series 30865, image 151). Preserved distal left PCA flow, and otherwise the bilateral PCA branches are within normal limits.  Both ICA siphons are patent and moderately dolichoectatic. Mild calcified plaque. No ICA siphon stenosis. Normal ophthalmic artery origins.  Patent carotid termini. Normal MCA and ACA origins. Mildly ectatic bilateral ACA branches. Anterior communicating artery within normal limits. No ACA stenosis identified. Mildly ectatic right MCA M1 segment. Right MCA branches are within normal limits.  Left MCA M1 segment is patent with mild ectasia. Left MCA bifurcation is patent. Left MCA branches are within normal limits.  Review of the MIP images confirms the above findings.  CTA NECK FINDINGS  Mediastinal lipomatosis. No superior mediastinal lymphadenopathy. Mild upper lobe atelectasis. Negative thyroid, larynx, pharynx,  parapharyngeal spaces, retropharyngeal space, sublingual space, submandibular glands, parotid glands and orbits.  Upper limits of normal level 2 cervical lymph nodes. Other cervical nodal stations are within normal limits.  No acute osseous abnormality identified. Visualized paranasal sinuses and mastoids are clear.  VASCULAR FINDINGS: 3 vessel arch configuration. No arch atherosclerosis. Mildly to moderately tortuous great vessels, mostly on the left side.  No right CCA stenosis. Minimal calcified plaque the right carotid bifurcation in the posterior ICA bulb. No cervical right ICA stenosis. Moderate tortuosity of the cervical right ICA.  No proximal  right subclavian artery stenosis. Normal right vertebral artery origin. Intermittently tortuous but otherwise negative cervical right vertebral artery.  Tortuous proximal left CCA. Mild calcified plaque at the left carotid bifurcation. No stenosis. Moderately tortuous cervical left ICA without stenosis.  No proximal left subclavian artery stenosis. Normal left vertebral artery origin. Tortuous mildly dolichoectatic proximal left vertebral artery. The left vertebral artery is mildly dominant throughout. It demonstrates no stenosis to the skullbase.  Review of the MIP images confirms the above findings.  IMPRESSION: 1. 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 No arterial stenosis in the neck. No major circle of Willis branch occlusion. 2. Mild irregularity of the left PCA P1 segment. Consider left thalamostriate type small vessel ischemia in this setting. 3. Stable CT appearance of the brain. No evidence of cortically based acute infarction identified. Salient findings on this study reviewed in person with Dr. Ritta Slot on 02/03/2014 at 1141 hr .   Electronically Signed   By: Augusto Gamble M.D.   On: 02/03/2014 12:12   Mr Brain Wo Contrast  02/04/2014   CLINICAL DATA:  Stroke.  Right-sided weakness.  Slurred speech.  EXAM: MRI HEAD WITHOUT CONTRAST  MRA HEAD WITHOUT CONTRAST  TECHNIQUE: Multiplanar, multiecho pulse sequences of the brain and surrounding structures were obtained without intravenous contrast. Angiographic images of the head were obtained using MRA technique without contrast.  COMPARISON:  Head CT 02/03/2014 with CT angiography.  FINDINGS: MRI HEAD FINDINGS  Diffusion imaging shows a 2 x 2.5 cm region of acute infarction affecting the lateral basal ganglia and radiating white matter tracts on the left. Mild swelling but no hemorrhage or mass effect. No other acute infarction.  There is an old left pontine infarction. Other chronic small-vessel changes of the pons are  present. There is extensive chronic small-vessel disease of the cerebral hemispheric white matter. No confluent large vessel territory infarction. No mass lesion, hydrocephalus or extra-axial collection. No pituitary mass. No significant sinus disease.  MRA HEAD FINDINGS  Both internal carotid arteries are widely patent into the brain. The anterior and middle cerebral vessels are patent without proximal stenosis, aneurysm or vascular malformation. Both vertebral arteries are widely patent to the basilar. No basilar stenosis. Posterior circulation branch vessels are patent.  IMPRESSION: 2 x 2.5 cm region of acute infarction affecting the left basal ganglia and radiating white matter tracts. Mild swelling but no hemorrhage or mass effect.  Extensive chronic small vessel disease throughout the cerebral hemispheric white matter. 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  Negative intracranial MR angiography of the large and medium size vessels.   Electronically Signed   By: Paulina Fusi M.D.   On: 02/04/2014 12:48   Dg Chest Port 1 View  02/03/2014   CLINICAL DATA:  Post tPA  EXAM: PORTABLE CHEST - 1 VIEW  COMPARISON:  DG CHEST 2 VIEW dated 05/15/2012  FINDINGS: Stable cardiac silhouette which is enlarged. There is bibasilar atelectasis greater on the left  unchanged. No pulmonary edema.  IMPRESSION: No interval change.  Cardiomegaly and left lower lobe atelectasis.   Electronically Signed   By: Genevive BiStewart  Edmunds M.D.   On: 02/03/2014 23:17   Mr Maxine GlennMra Head/brain Wo Cm  02/04/2014   CLINICAL DATA:  Stroke.  Right-sided weakness.  Slurred speech.  EXAM: MRI HEAD WITHOUT CONTRAST  MRA HEAD WITHOUT CONTRAST  TECHNIQUE: Multiplanar, multiecho pulse sequences of the brain and surrounding structures were obtained without intravenous contrast. Angiographic images of the head were obtained using MRA technique without contrast.  COMPARISON:  Head CT 02/03/2014 with CT angiography.  FINDINGS: MRI HEAD FINDINGS  Diffusion imaging shows a 2 x 2.5  cm region of acute infarction affecting the lateral basal ganglia and radiating white matter tracts on the left. Mild swelling but no hemorrhage or mass effect. No other acute infarction.  There is an old left pontine infarction. Other chronic small-vessel changes of the pons are present. There is extensive chronic small-vessel disease of the cerebral hemispheric white matter. No confluent large vessel territory infarction. No mass lesion, hydrocephalus or extra-axial collection. No pituitary mass. No significant sinus disease.  MRA HEAD FINDINGS  Both internal carotid arteries are widely patent into the brain. The anterior and middle cerebral vessels are patent without proximal stenosis, aneurysm or vascular malformation. Both vertebral arteries are widely patent to the basilar. No basilar stenosis. Posterior circulation branch vessels are patent.  IMPRESSION: 2 x 2.5 cm region of acute infarction affecting the left basal ganglia and radiating white matter tracts. Mild swelling but no hemorrhage or mass effect.  Extensive chronic small vessel disease throughout the cerebral hemispheric white matter. 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  Negative intracranial MR angiography of the large and medium size vessels.   Electronically Signed   By: Paulina FusiMark  Shogry M.D.   On: 02/04/2014 12:48    CT of the brain  Atrophy with extensive supratentorial small vessel disease as well  as prior infarct in the left pons   MRI of the brain  2 x 2.5 cm region of acute infarction affecting the left basal  ganglia and radiating white matter tracts. Mild swelling but no  hemorrhage or mass effect.   MRA of the brain    2D Echocardiogram  The cavity size was normal. Systolic function was vigorous. The estimated ejection fraction was in the range of 65% to 70%. Wall motion was normal; there were no regional wall motion abnormalities. There was an increased relative contribution of atrial contraction to ventricular filling.   Carotid  Doppler  No hemodynamic stenosis  CXR    EKG  normal EKG, normal sinus rhythm, unchanged from previous tracings. For complete results please see formal report.   Therapy Recommendations pending  Physical Exam   General: in bed  CV: RRR  Mental Status:  Patient is awake, alert, oriented to person, place, month, year, and situation.  Immediate and remote memory are intact.  Patient is able to give a clear and coherent history.  No signs of aphasia or neglect  +dysarthria  Cranial Nerves:  II: Visual Fields are full. Pupils are equal, round, and reactive to light. Discs are difficult to visualize.  III,IV, VI: EOMI without ptosis or diploplia.  V: Facial sensation is symmetric to temperature  VII: Facial movement is notable for R facial droop  VIII: hearing is intact to voice  X: Uvula elevates symmetrically  XI: Shoulder shrug is symmetric.  XII: tongue is midline without atrophy or fasciculations.  Motor:  Tone  is normal. Bulk is normal. 5/5 strength was present on the left, no movement right arm, able to lift leg off bed, but drifts down to bed.  Sensory:  Sensation is diminished to pin in right leg.  Deep Tendon Reflexes:  2+ and symmetric in the biceps and patellae.  Plantars:  Toes are upgoing bilaterally.  Cerebellar:  FNF and HKS are intact on the left  Gait:  Not assessed due to acute nature of evaluation and multiple medical monitors in ED setting.   ASSESSMENT Dakota Hall is a 63 y.o.  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 CTA shows no large vessel occlusion and he is being admitted to the ICU for post TPA care given around 0900.     hba1c pending  LDL 113  htn  dm   Hospital day # 2  TREATMENT/PLAN  Plavix 75 daily as failed ASA 325 at home  HbA1c, lipid panel  D/c bedrest  On Nicardipine at 3.   Started on home amlodipine and nebivolol at half the dose for BP control.   D/c bedrest orders   Pt/ot  Passed speech, diet advanced.    Discussed with pt's son and pt at bedside yesterday.   Transfer out of unit when off nicardipine Dakota Hall   02/05/2014 9:26 AM  I have personally obtained a history, examined the patient, evaluated imaging results, and formulated the assessment and plan of care. I agree with the above.    To contact Stroke Continuity provider, please refer to [email or web address]. After hours, contact General Neurology

## 2014-02-05 NOTE — Evaluation (Signed)
Physical Therapy Evaluation Patient Details Name: Dakota Hall MRN: [id number] DOB: 10/16/1951 Today's Date: 02/05/2014 Time: 1130-1200 PT Time Calculation (min): 30 min  PT Assessment / Plan / Recommendation History of Present Illness  [name] is a 63 y.o.  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 CTA shows no large vessel occlusion and he is being admitted to the ICU for post TPA care given around 0900.   Clinical Impression  Pt adm due to the above. Presents with decreased independence with functional mobility due to deficits indicated below. Pt with Rt hemiparesis and demo some impulsivity during session. Pt to benefit from skilled acute PT to maximize functional mobility and address deficits indicated below. PTA pt was independent with all ADLs and mobility. Pt to be great candidate for CIR to maximize independence prior to returning home with family.     PT Assessment  Patient needs continued PT services    Follow Up Recommendations  CIR    Does the patient have the potential to tolerate intense rehabilitation      Barriers to Discharge        Equipment Recommendations  Other (comment) (TBD)    Recommendations for Other Services Rehab consult   Frequency Min 4X/week    Precautions / Restrictions Precautions Precautions: Fall Precaution Comments: Rt hemiparesis  Restrictions Weight Bearing Restrictions: No   Pertinent Vitals/Pain 189/95 sitting EOB; RN notified.       Mobility  Bed Mobility Overal bed mobility: +2 for physical assistance;Needs Assistance Bed Mobility: Supine to Sit;Sit to Supine Supine to sit: +2 for physical assistance;Mod assist;HOB elevated Sit to supine: Mod assist General bed mobility comments: pt with difficulty advancing Rt LE to/off EOB; Required 2 person (A) with use of draw pad to bring hips and trunk to EOB and (A) to elevate shoulders to sitting; pt neglectful or Rt UE; was able to advance into bed with less (A) going to his strong side;  required (A) to bring Rt LE onto bed and position Rt UE  Transfers Overall transfer level: Needs assistance Equipment used: 2 person hand held assist Transfers: Sit to/from Stand Sit to Stand: +2 physical assistance;Max assist General transfer comment: performed sit to stand x2; blocking of Rt LE required; pt demo buckling; use of gt belt and draw pad to bring hips off bed; unable to full achieve upright position on either attempt today; pt very motivated and effortful with Lt side  Modified Rankin (Stroke Patients Only) Pre-Morbid Rankin Score: No symptoms Modified Rankin: Severe disability         PT Diagnosis: Difficulty walking;Generalized weakness  PT Problem List: Decreased strength;Decreased activity tolerance;Decreased range of motion;Decreased balance;Decreased mobility;Decreased knowledge of use of DME;Decreased safety awareness;Obesity PT Treatment Interventions: DME instruction;Gait training;Functional mobility training;Therapeutic activities;Balance training;Therapeutic exercise;Neuromuscular re-education;Patient/family education     PT Goals(Current goals can be found in the care plan section) Acute Rehab PT Goals Patient Stated Goal: to get home PT Goal Formulation: With patient/family Time For Goal Achievement: 02/19/14 Potential to Achieve Goals: Good  Visit Information  Last PT Received On: 02/05/14 Assistance Needed: +2 History of Present Illness: [name] is a 63 y.o.  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 CTA shows no large vessel occlusion and he is being admitted to the ICU for post TPA care given around 0900.        Prior Functioning  Home Living Family/patient expects to be discharged to:: Private residence Living Arrangements: Spouse/significant other Available Help at Discharge:  Family;Available 24 hours/day Type of Home: House Home Access: Stairs to enter [employer] of Steps: 4 Entrance Stairs-Rails: Left;Right;Can reach both Home  Layout: Two level;Bed/bath upstairs;1/2 bath on main level Alternate Level Stairs-Number of Steps: flight Alternate Level Stairs-Rails: Can reach both;Left;Right Home Equipment: Shower seat  Lives With: Spouse Prior Function Level of Independence: Independent Comments: PTA pt very independent and active; still drove and Buyer, retail Communication: Expressive difficulties Dominant Hand: Right    Cognition  Cognition Arousal/Alertness: Awake/alert Behavior During Therapy: Impulsive Overall Cognitive Status: Impaired/Different from baseline Area of Impairment: Safety/judgement Safety/Judgement: Decreased awareness of safety General Comments: pt can be impulsive at times and attempts to stand prior to cues     Extremity/Trunk Assessment Upper Extremity Assessment Upper Extremity Assessment: Defer to OT evaluation (Rt hemiparesis; hand edematous ) Lower Extremity Assessment Lower Extremity Assessment: RLE deficits/detail RLE Deficits / Details: able to perform SLR in bed and demo 3-/5 strength in quad in sitting; 2+ in Rt ankle DF RLE Sensation:  (sensation intact to light touch) Cervical / Trunk Assessment Cervical / Trunk Assessment: Normal   Balance Balance Overall balance assessment: Needs assistance Sitting-balance support: Feet supported;No upper extremity supported Sitting balance-Leahy Scale: Fair Standing balance support: During functional activity;Bilateral upper extremity supported Standing balance-Leahy Scale: Zero Standing balance comment: requries max (A) of 2  General Comments General comments (skin integrity, edema, etc.): Rt hand edematous; pt tearfu at end of session; given max encouragement and discussed CIR recommendation with pt and family  End of Session PT - End of Session Equipment Utilized During Treatment: Gait belt Activity Tolerance: Patient tolerated treatment well Patient left: in bed;with call bell/phone within reach;with family/visitor  present Nurse Communication: Mobility status;Precautions  GP     Donell Sievert, [place] [phone] 02/05/2014, 2:29 PM

## 2014-02-06 ENCOUNTER — Encounter (HOSPITAL_COMMUNITY): Payer: Self-pay | Admitting: Nurse Practitioner

## 2014-02-06 DIAGNOSIS — E669 Obesity, unspecified: Secondary | ICD-10-CM | POA: Diagnosis present

## 2014-02-06 DIAGNOSIS — I6330 Cerebral infarction due to thrombosis of unspecified cerebral artery: Secondary | ICD-10-CM

## 2014-02-06 DIAGNOSIS — I10 Essential (primary) hypertension: Secondary | ICD-10-CM | POA: Diagnosis present

## 2014-02-06 LAB — GLUCOSE, CAPILLARY
GLUCOSE-CAPILLARY: 131 mg/dL — AB (ref 70–99)
GLUCOSE-CAPILLARY: 139 mg/dL — AB (ref 70–99)
Glucose-Capillary: 108 mg/dL — ABNORMAL HIGH (ref 70–99)
Glucose-Capillary: 122 mg/dL — ABNORMAL HIGH (ref 70–99)
Glucose-Capillary: 122 mg/dL — ABNORMAL HIGH (ref 70–99)
Glucose-Capillary: 165 mg/dL — ABNORMAL HIGH (ref 70–99)

## 2014-02-06 MED ORDER — SIMVASTATIN 20 MG PO TABS
20.0000 mg | ORAL_TABLET | Freq: Every day | ORAL | Status: DC
  Administered 2014-02-06 – 2014-02-07 (×2): 20 mg via ORAL
  Filled 2014-02-06 (×3): qty 1

## 2014-02-06 MED ORDER — METFORMIN HCL ER 500 MG PO TB24
1000.0000 mg | ORAL_TABLET | Freq: Every day | ORAL | Status: DC
  Administered 2014-02-07 – 2014-02-08 (×2): 1000 mg via ORAL
  Filled 2014-02-06 (×3): qty 2

## 2014-02-06 MED ORDER — ARIPIPRAZOLE 5 MG PO TABS
2.5000 mg | ORAL_TABLET | Freq: Every day | ORAL | Status: DC
  Administered 2014-02-06 – 2014-02-08 (×3): 2.5 mg via ORAL
  Filled 2014-02-06 (×3): qty 1

## 2014-02-06 MED ORDER — NEBIVOLOL HCL 5 MG PO TABS
5.0000 mg | ORAL_TABLET | Freq: Every day | ORAL | Status: DC
  Administered 2014-02-06 – 2014-02-08 (×2): 5 mg via ORAL
  Filled 2014-02-06 (×3): qty 1

## 2014-02-06 MED ORDER — VITAMIN D (ERGOCALCIFEROL) 1.25 MG (50000 UNIT) PO CAPS: 50000.0000 [IU] | ORAL_CAPSULE | ORAL | Status: DC

## 2014-02-06 MED ORDER — HYDROCHLOROTHIAZIDE 12.5 MG PO CAPS
12.5000 mg | ORAL_CAPSULE | Freq: Every day | ORAL | Status: DC
  Administered 2014-02-06 – 2014-02-08 (×2): 12.5 mg via ORAL
  Filled 2014-02-06 (×3): qty 1

## 2014-02-06 MED ORDER — SERTRALINE HCL 50 MG PO TABS
150.0000 mg | ORAL_TABLET | Freq: Every day | ORAL | Status: DC
  Administered 2014-02-06 – 2014-02-08 (×3): 150 mg via ORAL
  Filled 2014-02-06 (×2): qty 1

## 2014-02-06 MED ORDER — PIOGLITAZONE HCL 15 MG PO TABS
15.0000 mg | ORAL_TABLET | Freq: Every day | ORAL | Status: DC
  Administered 2014-02-06 – 2014-02-08 (×3): 15 mg via ORAL
  Filled 2014-02-06 (×3): qty 1

## 2014-02-06 MED ORDER — ENOXAPARIN SODIUM 40 MG/0.4ML ~~LOC~~ SOLN
40.0000 mg | SUBCUTANEOUS | Status: DC
  Administered 2014-02-06 – 2014-02-08 (×3): 40 mg via SUBCUTANEOUS
  Filled 2014-02-06 (×3): qty 0.4

## 2014-02-06 MED ORDER — INSULIN ASPART 100 UNIT/ML ~~LOC~~ SOLN
2.0000 [IU] | Freq: Three times a day (TID) | SUBCUTANEOUS | Status: DC
  Administered 2014-02-06 (×2): 2 [IU] via SUBCUTANEOUS

## 2014-02-06 MED ORDER — VALSARTAN-HYDROCHLOROTHIAZIDE 160-12.5 MG PO TABS: 1.0000 | ORAL_TABLET | Freq: Every day | ORAL | Status: DC

## 2014-02-06 MED ORDER — INSULIN ASPART 100 UNIT/ML ~~LOC~~ SOLN
0.0000 [IU] | Freq: Three times a day (TID) | SUBCUTANEOUS | Status: DC
  Administered 2014-02-07: 2 [IU] via SUBCUTANEOUS
  Administered 2014-02-07: 3 [IU] via SUBCUTANEOUS
  Administered 2014-02-07 – 2014-02-08 (×3): 2 [IU] via SUBCUTANEOUS

## 2014-02-06 MED ORDER — VALSARTAN 160 MG PO TABS
160.0000 mg | ORAL_TABLET | Freq: Every day | ORAL | Status: DC
  Administered 2014-02-06 – 2014-02-08 (×2): 160 mg via ORAL
  Filled 2014-02-06 (×3): qty 1

## 2014-02-06 MED ORDER — AMLODIPINE BESYLATE 10 MG PO TABS
10.0000 mg | ORAL_TABLET | Freq: Every day | ORAL | Status: DC
  Administered 2014-02-06 – 2014-02-08 (×3): 10 mg via ORAL
  Filled 2014-02-06 (×3): qty 1

## 2014-02-06 NOTE — Evaluation (Signed)
Occupational Therapy Evaluation Patient Details Name: Dakota AnoRoger W Rosetti MRN: [id number] DOB: 11/28/1950 Today's Date: 02/06/2014 Time: [id number] OT Time Calculation (min): 29 min  OT Assessment / Plan / Recommendation History of present illness [name] is a 63 y.o.  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 CTA shows no large vessel occlusion and he is being admitted to the ICU for post TPA care given around 0900.    Clinical Impression   Pt presents with dense R UE hemiparesis, dependence in all ADL, impaired balance, and dysarthria.  He was somewhat tearful during evaluation with some impulsivity noted.  Pt has a supportive family and is highly, but will need intense rehab to be able to discharge home.    OT Assessment  Patient needs continued OT Services    Follow Up Recommendations  CIR    Barriers to Discharge      Equipment Recommendations   (to be determined in next venue)    Recommendations for Other Services Rehab consult  Frequency  Min 3X/week    Precautions / Restrictions Precautions Precautions: Fall Precaution Comments: Rt hemiparesis  Restrictions Weight Bearing Restrictions: No   Pertinent Vitals/Pain VS monitored throughout, no pain reported    ADL  Eating/Feeding: Minimal assistance (with L hand) Where Assessed - Eating/Feeding: Bed level Grooming: Wash/dry face;Teeth care;Minimal assistance Where Assessed - Grooming: Supine, head of bed up Upper Body Bathing: Maximal assistance Where Assessed - Upper Body Bathing: Supine, head of bed up Lower Body Bathing: +1 Total assistance Where Assessed - Lower Body Bathing: Supine, head of bed up;Rolling right and/or left Upper Body Dressing: Maximal assistance Where Assessed - Upper Body Dressing: Supine, head of bed up Lower Body Dressing: +1 Total assistance Where Assessed - Lower Body Dressing: Supine, head of bed up;Rolling right and/or left ADL Comments: Began instruction in hemi techniques for ADL.  Instructed pt  and wife in protection of R UE during bed mobility, positioning, and edema management.    OT Diagnosis: Generalized weakness;Cognitive deficits;Hemiplegia dominant side  OT Problem List: Decreased strength;Decreased activity tolerance;Impaired balance (sitting and/or standing);Decreased coordination;Decreased safety awareness;Decreased knowledge of use of DME or AE;Impaired tone;Obesity;Impaired UE functional use;Increased edema OT Treatment Interventions: Self-care/ADL training;Neuromuscular education;DME and/or AE instruction;Therapeutic activities;Cognitive remediation/compensation;Patient/family education;Balance training   OT Goals(Current goals can be found in the care plan section) Acute Rehab OT Goals Patient Stated Goal: to get home OT Goal Formulation: With patient Time For Goal Achievement: 02/20/14 Potential to Achieve Goals: Good ADL Goals Pt Will Perform Eating: sitting;with supervision (pt will set up modified independently) Pt Will Perform Grooming: with supervision;sitting (pt will set up modified independently) Pt Will Transfer to Toilet: with +2 assist;with min assist;bedside commode Pt/caregiver will Perform Home Exercise Program: Right Upper extremity;With Supervision (self ROM) Additional ADL Goal #1: Pt will sit EOB with supervision x 10 minutes while engaged in ADL. Additional ADL Goal #2: Pt will position R UE in bed and chair on pillows and protect R UE from injury during mobility with min verbal cues.   Visit Information  Last OT Received On: 02/06/14 Assistance Needed: +2 History of Present Illness: Dakota Hall is a 63 y.o.  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 CTA shows no large vessel occlusion and he is being admitted to the ICU for post TPA care given around 0900.        Prior Functioning     Home Living Family/patient expects to be discharged to:: Private residence Living Arrangements: Spouse/significant other Available Help  at Discharge:  Family;Available 24 hours/day Type of Home: House Home Access: Stairs to enter [employer] of Steps: 4 Entrance Stairs-Rails: Left;Right;Can reach both Home Layout: Two level;Bed/bath upstairs;1/2 bath on main level Alternate Level Stairs-Number of Steps: flight Alternate Level Stairs-Rails: Can reach both;Left;Right Home Equipment: Shower seat - built in  Lives With: Spouse Prior Function Level of Independence: Independent Comments: PTA pt very independent and active; still drove and Animator: Expressive difficulties Dominant Hand: Right         Vision/Perception Vision - History Baseline Vision: Wears glasses all the time Patient Visual Report: No change from baseline   Cognition  Cognition Arousal/Alertness: Awake/alert Behavior During Therapy: Impulsive Overall Cognitive Status: Impaired/Different from baseline Area of Impairment: Safety/judgement Safety/Judgement: Decreased awareness of safety    Extremity/Trunk Assessment Upper Extremity Assessment Upper Extremity Assessment: RUE deficits/detail RUE Deficits / Details: Brunnstrom level 1, emerging extensor tone, mild edema in hand RUE Sensation: decreased light touch (decreased finger identification) RUE Coordination: decreased fine motor;decreased gross motor Lower Extremity Assessment Lower Extremity Assessment: Defer to PT evaluation     Mobility Bed Mobility Bed Mobility: Rolling Rolling: Mod assist (heavy use of rail) Supine to sit: +2 for physical assistance;Mod assist;HOB elevated Sit to supine: Mod assist     Exercise     Balance Balance Overall balance assessment: Needs assistance Sitting-balance support: Bilateral upper extremity supported;Feet supported Sitting balance-Leahy Scale: Poor   End of Session OT - End of Session Activity Tolerance: Patient tolerated treatment well Patient left: in bed;with call bell/phone within reach;with family/visitor  present  GO     Evern Bio 02/06/2014, 12:36 PM [phone]

## 2014-02-06 NOTE — Consult Note (Signed)
Physical Medicine and Rehabilitation Consult Reason for Consult: CVA  Referring Physician: Dr. Amada Jupiter   HPI: Dakota Hall is a 63 y.o. right-handed male with history of CVA x2 admitted 02/03/2014 recent onset right-sided weakness. Patient independent prior to admission living with his wife. MRI of the head 2x2.5 cm acute infarct left basal ganglia radiating to white matter tracts. MRA of the head with no major occlusion or stenosis. Echocardiogram ejection fraction 60% without emboli. Carotid Dopplers with no ICA stenosis. Patient did receive TPA. Neurology followup maintain on Plavix therapy for CVA prophylaxis as well as subcutaneous heparin for DVT prophylaxis. He is on a dysphagia 3 thin liquid diet. Physical therapy evaluation completed 02/05/2014 with recommendations for physical medicine rehabilitation consult   Review of Systems  Gastrointestinal: Positive for constipation.  Neurological: Positive for dizziness and weakness.  Psychiatric/Behavioral: Positive for depression.  All other systems reviewed and are negative.   Past Medical History  Diagnosis Date  . Diabetes mellitus   . Hypertension   . Stroke    Past Surgical History  Procedure Laterality Date  . Tonsillectomy    . Anal fissure repair     No family history on file. Social History:  reports that he has never smoked. He has never used smokeless tobacco. He reports that he does not drink alcohol or use illicit drugs. Allergies:  Allergies  Allergen Reactions  . Penicillins Hives   Medications Prior to Admission  Medication Sig Dispense Refill  . ABILIFY 5 MG tablet 2.5 mg.       . amLODipine (NORVASC) 10 MG tablet Take 10 mg by mouth daily.      Marland Kitchen aspirin 325 MG tablet Take 325 mg by mouth daily.      . metFORMIN (GLUCOPHAGE-XR) 500 MG 24 hr tablet Take 1,000 mg by mouth daily with breakfast.      . nebivolol (BYSTOLIC) 5 MG tablet Take 1 tablet (5 mg total) by mouth daily.  30 tablet  0  .  pioglitazone (ACTOS) 15 MG tablet Take 15 mg by mouth daily.      . sertraline (ZOLOFT) 100 MG tablet Take 150 mg by mouth daily. Take 2 tabs (300) daily      . traZODone (DESYREL) 50 MG tablet Take 50-100 mg by mouth daily as needed. For sleep      . valsartan-hydrochlorothiazide (DIOVAN-HCT) 160-12.5 MG per tablet Take 1 tablet by mouth daily.       . Vitamin D, Ergocalciferol, (DRISDOL) 50000 UNITS CAPS capsule 50,000 Units every 7 (seven) days.       . simvastatin (ZOCOR) 20 MG tablet Take 1 tablet (20 mg total) by mouth daily at 6 PM.  30 tablet  0    Home: Home Living Family/patient expects to be discharged to:: Private residence Living Arrangements: Spouse/significant other Available Help at Discharge: Family;Available 24 hours/day Type of Home: House Home Access: Stairs to enter [employer] of Steps: 4 Entrance Stairs-Rails: Left;Right;Can reach both Home Layout: Two level;Bed/bath upstairs;1/2 bath on main level Alternate Level Stairs-Number of Steps: flight Alternate Level Stairs-Rails: Can reach both;Left;Right Home Equipment: Shower seat  Lives With: Spouse  Functional History: Prior Function Vocation: Retired Comments: PTA pt very independent and active; still drove and Surveyor, quantity Status:  Mobility:          ADL:    Cognition: Cognition Overall Cognitive Status: Impaired/Different from baseline Arousal/Alertness: Awake/alert Orientation Level: Oriented X4 Attention: Sustained Sustained Attention: Appears intact  Memory:  (TBA) Awareness: Appears intact Problem Solving:  (TBA further) Safety/Judgment: Appears intact Cognition Arousal/Alertness: Awake/alert Behavior During Therapy: Impulsive Overall Cognitive Status: Impaired/Different from baseline Area of Impairment: Safety/judgement Safety/Judgement: Decreased awareness of safety General Comments: pt can be impulsive at times and attempts to stand prior to cues   Blood  pressure 145/75, pulse 51, temperature 97.8 F (36.6 C), temperature source Axillary, resp. rate 22, height 5\' 9"  (1.753 m), weight 116.6 kg (257 lb 0.9 oz), SpO2 94.00%. Physical Exam  Vitals reviewed. Constitutional: He is oriented to person, place, and time. He appears well-developed.  Eyes: EOM are normal.  Neck: Normal range of motion. Neck supple. No thyromegaly present.  Cardiovascular: Normal rate and regular rhythm.   Respiratory: Effort normal and breath sounds normal. No respiratory distress.  GI: Soft. Bowel sounds are normal. He exhibits no distension.  Neurological: He is alert and oriented to person, place, and time.  Follows commands.Makes good eye contact with examiner. Mild right central 7. No sensory deficits. RUE tr to1 at deltoid, tricep, otherwise absent. LLE HF and KE 1+, trace ankle movement. LUE limited due to rotator cuff but otherwise 2-3/5. LLE is 2-3+ /5. genearlly alert. Follows commands. DTR's 3+ on right. Resting tone on right of 1+ to 2/4   Skin: Skin is warm and dry.  Psychiatric: He has a normal mood and affect. His behavior is normal.    Results for orders placed during the hospital encounter of 02/03/14 (from the past 24 hour(s))  GLUCOSE, CAPILLARY     Status: Abnormal   Collection Time    02/05/14  8:38 AM      Result Value Ref Range   Glucose-Capillary 164 (*) 70 - 99 mg/dL  GLUCOSE, CAPILLARY     Status: Abnormal   Collection Time    02/05/14 12:08 PM      Result Value Ref Range   Glucose-Capillary 158 (*) 70 - 99 mg/dL  GLUCOSE, CAPILLARY     Status: Abnormal   Collection Time    02/05/14  4:11 PM      Result Value Ref Range   Glucose-Capillary 178 (*) 70 - 99 mg/dL  GLUCOSE, CAPILLARY     Status: Abnormal   Collection Time    02/05/14  7:36 PM      Result Value Ref Range   Glucose-Capillary 184 (*) 70 - 99 mg/dL   Comment 1 Notify RN    GLUCOSE, CAPILLARY     Status: Abnormal   Collection Time    02/05/14 11:18 PM      Result Value  Ref Range   Glucose-Capillary 108 (*) 70 - 99 mg/dL   Comment 1 Documented in Chart     Comment 2 Notify RN    GLUCOSE, CAPILLARY     Status: Abnormal   Collection Time    02/06/14  3:17 AM      Result Value Ref Range   Glucose-Capillary 122 (*) 70 - 99 mg/dL   Comment 1 Documented in Chart     Comment 2 Notify RN     Mr Brain Wo Contrast  02/04/2014   CLINICAL DATA:  Stroke.  Right-sided weakness.  Slurred speech.  EXAM: MRI HEAD WITHOUT CONTRAST  MRA HEAD WITHOUT CONTRAST  TECHNIQUE: Multiplanar, multiecho pulse sequences of the brain and surrounding structures were obtained without intravenous contrast. Angiographic images of the head were obtained using MRA technique without contrast.  COMPARISON:  Head CT 02/03/2014 with CT angiography.  FINDINGS: MRI HEAD  FINDINGS  Diffusion imaging shows a 2 x 2.5 cm region of acute infarction affecting the lateral basal ganglia and radiating white matter tracts on the left. Mild swelling but no hemorrhage or mass effect. No other acute infarction.  There is an old left pontine infarction. Other chronic small-vessel changes of the pons are present. There is extensive chronic small-vessel disease of the cerebral hemispheric white matter. No confluent large vessel territory infarction. No mass lesion, hydrocephalus or extra-axial collection. No pituitary mass. No significant sinus disease.  MRA HEAD FINDINGS  Both internal carotid arteries are widely patent into the brain. The anterior and middle cerebral vessels are patent without proximal stenosis, aneurysm or vascular malformation. Both vertebral arteries are widely patent to the basilar. No basilar stenosis. Posterior circulation branch vessels are patent.  IMPRESSION: 2 x 2.5 cm region of acute infarction affecting the left basal ganglia and radiating white matter tracts. Mild swelling but no hemorrhage or mass effect.  Extensive chronic small vessel disease throughout the cerebral hemispheric white matter.  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  Negative intracranial MR angiography of the large and medium size vessels.   Electronically Signed   By: Paulina Fusi M.D.   On: 02/04/2014 12:48   Mr Maxine Glenn Head/brain Wo Cm  02/04/2014   CLINICAL DATA:  Stroke.  Right-sided weakness.  Slurred speech.  EXAM: MRI HEAD WITHOUT CONTRAST  MRA HEAD WITHOUT CONTRAST  TECHNIQUE: Multiplanar, multiecho pulse sequences of the brain and surrounding structures were obtained without intravenous contrast. Angiographic images of the head were obtained using MRA technique without contrast.  COMPARISON:  Head CT 02/03/2014 with CT angiography.  FINDINGS: MRI HEAD FINDINGS  Diffusion imaging shows a 2 x 2.5 cm region of acute infarction affecting the lateral basal ganglia and radiating white matter tracts on the left. Mild swelling but no hemorrhage or mass effect. No other acute infarction.  There is an old left pontine infarction. Other chronic small-vessel changes of the pons are present. There is extensive chronic small-vessel disease of the cerebral hemispheric white matter. No confluent large vessel territory infarction. No mass lesion, hydrocephalus or extra-axial collection. No pituitary mass. No significant sinus disease.  MRA HEAD FINDINGS  Both internal carotid arteries are widely patent into the brain. The anterior and middle cerebral vessels are patent without proximal stenosis, aneurysm or vascular malformation. Both vertebral arteries are widely patent to the basilar. No basilar stenosis. Posterior circulation branch vessels are patent.  IMPRESSION: 2 x 2.5 cm region of acute infarction affecting the left basal ganglia and radiating white matter tracts. Mild swelling but no hemorrhage or mass effect.  Extensive chronic small vessel disease throughout the cerebral hemispheric white matter. 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  Negative intracranial MR angiography of the large and medium size vessels.   Electronically Signed   By: Paulina Fusi M.D.   On:  02/04/2014 12:48    Assessment/Plan: Diagnosis: left basal ganglia infarct 1. Does the need for close, 24 hr/day medical supervision in concert with the patient's rehab needs make it unreasonable for this patient to be served in a less intensive setting? Yes 2. Co-Morbidities requiring supervision/potential complications: htn, dm, small vessel stroke 3. Due to bladder management, bowel management, safety, skin/wound care, disease management, medication administration, pain management and patient education, does the patient require 24 hr/day rehab nursing? Yes 4. Does the patient require coordinated care of a physician, rehab nurse, PT (1-2 hrs/day, 5 days/week), OT (1-2 hrs/day, 5 days/week) and SLP (1-2 hrs/day, 5 days/week) to address physical  and functional deficits in the context of the above medical diagnosis(es)? Yes Addressing deficits in the following areas: balance, endurance, locomotion, strength, transferring, bowel/bladder control, bathing, dressing, feeding, grooming, toileting, cognition, speech, swallowing and psychosocial support 5. Can the patient actively participate in an intensive therapy program of at least 3 hrs of therapy per day at least 5 days per week? Yes and Potentially 6. The potential for patient to make measurable gains while on inpatient rehab is excellent 7. Anticipated functional outcomes upon discharge from inpatient rehab are min assist with PT, min assist with OT, mod I with SLP. 8. Estimated rehab length of stay to reach the above functional goals is: 20-24 days 9. Does the patient have adequate social supports to accommodate these discharge functional goals? Yes 10. Anticipated D/C setting: Home 11. Anticipated post D/C treatments: HH therapy and Outpatient therapy 12. Overall Rehab/Functional Prognosis: excellent  RECOMMENDATIONS: This patient's condition is appropriate for continued rehabilitative care in the following setting: CIR Patient has agreed to  participate in recommended program. Yes Note that insurance prior authorization may be required for reimbursement for recommended care.  Comment: Rehab Admissions Coordinator to follow up.  Thanks,  Ranelle OysterZachary T. Annesha Delgreco, MD, [place] DomFAAPMR     02/06/2014

## 2014-02-06 NOTE — Progress Notes (Signed)
Physical Therapy Treatment Patient Details Name: Dakota Hall MRN: [id number] DOB: 05/23/1951 Today's Date: 02/06/2014 Time: [id number] PT Time Calculation (min): 22 min  PT Assessment / Plan / Recommendation  History of Present Illness [name] is a 63 y.o.  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 CTA shows no large vessel occlusion and he is being admitted to the ICU for post TPA care given around 0900.    PT Comments   Pt with improved ability to transfer into standing and maintain standing balance this date. Pt extremely motivated and con't to be an excellent candidate for CIR to achieve maximal functional recovery for safe transition home.  Follow Up Recommendations  CIR     Does the patient have the potential to tolerate intense rehabilitation     Barriers to Discharge        Equipment Recommendations       Recommendations for Other Services Rehab consult  Frequency Min 4X/week   Progress towards PT Goals Progress towards PT goals: Progressing toward goals  Plan Current plan remains appropriate    Precautions / Restrictions Precautions Precautions: Fall Precaution Comments: Rt hemiparesis  Restrictions Weight Bearing Restrictions: No   Pertinent Vitals/Pain Denies pain    Mobility  Bed Mobility Overal bed mobility: +2 for physical assistance;Needs Assistance Bed Mobility: Supine to Sit Supine to sit: Mod assist;+2 for physical assistance Sit to supine: Mod assist;+2 for physical assistance General bed mobility comments: pt unable to use R UE functionally, pt did attempt to move R LE towards EOB. pt able to use L UE to pull up on PT to elevate trunk. Transfers Overall transfer level: Needs assistance Equipment used:  (2 person lift) Transfers: Sit to/from Stand Sit to Stand: Mod assist;Max assist;+2 physical assistance General transfer comment: completed 3 trials of sit to stand transitioning from maxAx2 to modAx2. pt demo'd improved ability to achieve bilat hip  extension and trunk upright, con't to decreased weight shift to the left Modified Rankin (Stroke Patients Only) Pre-Morbid Rankin Score: No symptoms Modified Rankin: Severe disability    Exercises     PT Diagnosis:    PT Problem List:   PT Treatment Interventions:     PT Goals (current goals can now be found in the care plan section) Acute Rehab PT Goals Patient Stated Goal: to get home  Visit Information  Last PT Received On: 02/06/14 Assistance Needed: +2 History of Present Illness: [name] is a 63 y.o.  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 CTA shows no large vessel occlusion and he is being admitted to the ICU for post TPA care given around 0900.     Subjective Data  Patient Stated Goal: to get home   Cognition  Cognition Arousal/Alertness: Awake/alert Behavior During Therapy: Impulsive Overall Cognitive Status: Impaired/Different from baseline Area of Impairment: Safety/judgement Safety/Judgement: Decreased awareness of safety General Comments: pt slightly impulsive during transfers    Balance  Balance Overall balance assessment: Needs assistance Sitting-balance support: Feet supported;No upper extremity supported Sitting balance-Leahy Scale: Fair Sitting balance - Comments: pt able to maintain upright position with minimal R lateral lean with min guard/supervision x 3 min Standing balance support: During functional activity;Single extremity supported Standing balance-Leahy Scale: Zero Standing balance comment: requires 2 person, 1 to block R knee from buckling and 2nd person to assist with maintaining equal weight shift. Worked on increasing R LE weight bearing via raising L heel. con't to require maxA to at hips and R knee to  prevent buckling. completed 15 trials. Pt able to tolerate standing x 5 min.  End of Session PT - End of Session Equipment Utilized During Treatment: Gait belt Activity Tolerance: Patient tolerated treatment well Patient left: in bed;with call  bell/phone within reach;with family/visitor present Nurse Communication: Mobility status;Precautions   GP     Dakota Hall 02/06/2014, 4:56 PM   Agree with above assessment.  Dakota Hall, PT, DPT Pager #: [phone] Office #: [phone]

## 2014-02-06 NOTE — Progress Notes (Signed)
Rehab Admissions Coordinator Note:  Patient was screened by Clois DupesBoyette, Taevion Sikora Godwin for appropriateness for an Inpatient Acute Rehab Consult per PT recommendations. Rehab consult is pending completion today.  Clois DupesBoyette, Nowell Sites Godwin 02/06/2014, 7:55 AM  I can be reached at [phone].

## 2014-02-06 NOTE — Progress Notes (Signed)
Stroke Team Progress Note  HISTORY 63 y.o. male with a history of two previous strokes who presents with sudden onset right hemiparesis at 8:40am on 02/03/2014. After arrival, the patient had severely elevated blood pressure requiring labetalol followed by nicardipine.  He has hx of DM and HTN, states well controlled. On ASA 325 prior to admission. He was administered IV tPA. CTA showed no large vessel occulusion, so not an intervention candidate. He was admitted to the ICU for further treatment and evaluation.  SUBJECTIVE His wife is at the bedside.   OBJECTIVE Most recent Vital Signs: Filed Vitals:   02/06/14 0745 02/06/14 0800 02/06/14 0815 02/06/14 0830  BP: 140/99 137/66 138/67 155/80  Pulse: 55 53 55 62  Temp:      TempSrc:      Resp: 20 7 21 21   Height:      Weight:      SpO2: 96% 95% 95% 97%   CBG (last 3)   Recent Labs  02/05/14 2318 02/06/14 0317 02/06/14 0818  GLUCAP 108* 122* 131*    IV Fluid Intake:   . sodium chloride 1,000 mL (02/06/14 0523)  . niCARDipine 3 mg/hr (02/06/14 0426)    MEDICATIONS  . amLODipine  5 mg Oral Daily  . clopidogrel  75 mg Oral Q breakfast  . heparin subcutaneous  5,000 Units Subcutaneous 3 times per day  . insulin aspart  2-6 Units Subcutaneous 6 times per day  . nebivolol  2.5 mg Oral Daily  . pantoprazole  40 mg Oral QHS  . sertraline  100 mg Oral Daily  . sertraline  300 mg Oral Daily  . simvastatin  20 mg Oral q1800   PRN:  acetaminophen, acetaminophen, labetalol, LORazepam  Diet:  Dysphagia 3 thin liquids Activity:  Up with assistance DVT Prophylaxis:  scds  CLINICALLY SIGNIFICANT STUDIES Basic Metabolic Panel:   Recent Labs Lab 02/03/14 0944 02/03/14 0958  NA 140 140  K 4.1 4.0  CL 102 104  CO2 24  --   GLUCOSE 278* 271*  BUN 16 17  CREATININE 1.22 1.30  CALCIUM 9.5  --    Liver Function Tests:   Recent Labs Lab 02/03/14 0944  AST 29  ALT 35  ALKPHOS 64  BILITOT 0.6  PROT 7.6  ALBUMIN 3.9    CBC:   Recent Labs Lab 02/03/14 0944 02/03/14 0958  WBC 8.6  --   NEUTROABS 4.8  --   HGB 15.6 16.0  HCT 43.0 47.0  MCV 86.7  --   PLT 186  --    Coagulation:   Recent Labs Lab 02/03/14 0944  LABPROT 12.8  INR 0.98   Cardiac Enzymes: No results found for this basename: CKTOTAL, CKMB, CKMBINDEX, TROPONINI,  in the last 168 hours Urinalysis: No results found for this basename: COLORURINE, APPERANCEUR, LABSPEC, PHURINE, GLUCOSEU, HGBUR, BILIRUBINUR, KETONESUR, PROTEINUR, UROBILINOGEN, NITRITE, LEUKOCYTESUR,  in the last 168 hours Lipid Panel    Component Value Date/Time   CHOL 177 02/04/2014 0243   TRIG 94 02/04/2014 0243   HDL 45 02/04/2014 0243   CHOLHDL 3.9 02/04/2014 0243   VLDL 19 02/04/2014 0243   LDLCALC 113* 02/04/2014 0243   HgbA1C  Lab Results  Component Value Date   HGBA1C 8.2* 02/04/2014    Urine Drug Screen:     Component Value Date/Time   LABOPIA NONE DETECTED 05/15/2012 2245   COCAINSCRNUR NONE DETECTED 05/15/2012 2245   LABBENZ NONE DETECTED 05/15/2012 2245   AMPHETMU NONE DETECTED 05/15/2012 2245  THCU NONE DETECTED 05/15/2012 2245   LABBARB NONE DETECTED 05/15/2012 2245    Alcohol Level: No results found for this basename: ETH,  in the last 168 hours   CT of the brain  02/03/2014    Atrophy with extensive supratentorial small vessel disease as well as prior infarct in the left pons. No acute appearing infarct seen. No hemorrhage or mass effect. Major intracranial vessels shows some increased attenuation, stable from prior study. The significance of this finding is questionable.    CT Angio Head and Neck 02/03/2014    1. 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 No arterial stenosis in the neck. No major circle of Willis branch occlusion. 2. Mild irregularity of the left PCA P1 segment. Consider left thalamostriate type small vessel ischemia in this setting. 3. Stable CT appearance of the brain. No evidence of cortically based acute  infarction identified. Salient findings on this study reviewed in person with   MRI of the brain 02/04/2014   2 x 2.5 cm region of acute infarction affecting the left basal ganglia and radiating white matter tracts. Mild swelling but no hemorrhage or mass effect.  Extensive chronic small vessel disease throughout the cerebral hemispheric white matter. 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     MRA of the brain  02/04/2014     Negative intracranial MR angiography of the large and medium size vessels.     2D Echocardiogram  EF 55-60% with no source of embolus.   Carotid Doppler  No evidence of hemodynamically significant internal carotid artery stenosis. Vertebral artery flow is antegrade.   CXR  02/03/2014   No interval change.  Cardiomegaly and left lower lobe atelectasis.     EKG  normal EKG, normal sinus rhythm, unchanged from previous tracings. For complete results please see formal report.   Therapy Recommendations CIR  Physical Exam   General: in bed  CV: RRR  Mental Status:  Patient is awake, alert, oriented to person, place, month, year, and situation.  Immediate and remote memory are intact.  Patient is able to give a clear and coherent history.  No signs of aphasia or neglect  +dysarthria  Cranial Nerves:  II: Visual Fields are full. Pupils are equal, round, and reactive to light. Discs are difficult to visualize.  III,IV, VI: EOMI without ptosis or diploplia.  V: Facial sensation is symmetric to temperature  VII: Facial movement is notable for R facial droop  VIII: hearing is intact to voice  X: Uvula elevates symmetrically  XI: Shoulder shrug is symmetric.  XII: tongue is midline without atrophy or fasciculations.  Motor:  Tone is normal. Bulk is normal. 5/5 strength was present on the left, no movement right arm, able to lift leg off bed, but drifts down to bed.  Sensory:  Sensation is diminished to pin in right leg.  Deep Tendon Reflexes:  2+ and symmetric in the biceps and  patellae.  Plantars:  Toes are upgoing bilaterally.  Cerebellar:  FNF and HKS are intact on the left  Gait:  Not assessed due to acute nature of evaluation and multiple medical monitors in ED setting.   ASSESSMENT [name] is a 63 y.o. male presenting with right side weakness. Status post IV t-PA 02/03/2014 at 1023 without complication. Imaging confirms a left basal ganglia infarct. Infarct felt to be thrombotic secondary to small vessel disease. On aspirin 325 mg orally every day prior to admission. Now on clopidogrel 75 mg orally every day for secondary stroke prevention. Work  up completed.  Malignant hypertension - on arrival BP 201/87, cardene used to control, only on 1/2 dose home BP meds. Remains on cardene this am with goal <160 Diabetes, uncontrolled HgbA1c 8.2, goal < 7.0  Hyperlipidemia, LDL 113, on no statin PTA, now on zocor 20 mg daily, goal LDL < 70 for diabetics obstructive sleep apnea, uses CPAP Obesity, Body mass index is 37.94 kg/(m^2). 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 Incidental small right parietal subcortical white matter also secondary to small vessel disease stroke 2013 Family hx stroke (father)  Hospital day # 3  TREATMENT/PLAN  Continue plavix for secondary stroke prevention  Resume home meds  Wean cardene  Overall BP goal < 220/110 (removed strict parameters)  Change CBGs to ac & hs  lovenox for VTE prophy  Rehab consult  Annie Main, MSN, RN, ANVP-BC, ANP-BC, GNP-BC [facility] Pager: [phone] 02/06/2014 9:04 AM  I have personally obtained a history, examined the patient, evaluated imaging results, and formulated the assessment and plan of care. I agree with the above. Delia Heady, MD   To contact Stroke Continuity provider, please refer to [email or web address]. After hours, contact General Neurology

## 2014-02-06 NOTE — Progress Notes (Signed)
Patient placed himself on cpap via home nasal pillows. Patient is tolerating cpap well at this time.

## 2014-02-07 LAB — GLUCOSE, CAPILLARY
GLUCOSE-CAPILLARY: 152 mg/dL — AB (ref 70–99)
Glucose-Capillary: 150 mg/dL — ABNORMAL HIGH (ref 70–99)
Glucose-Capillary: 128 mg/dL — ABNORMAL HIGH (ref 70–99)
Glucose-Capillary: 132 mg/dL — ABNORMAL HIGH (ref 70–99)

## 2014-02-07 LAB — HEMOGLOBIN A1C
HEMOGLOBIN A1C: 8.1 % — AB (ref <5.7)
Mean Plasma Glucose: 186 mg/dL — ABNORMAL HIGH (ref <117)

## 2014-02-07 NOTE — Progress Notes (Signed)
Stroke Team Progress Note  HISTORY 63 y.o. male with a history of two previous strokes who presents with sudden onset right hemiparesis at 8:40am on 02/03/2014. After arrival, the patient had severely elevated blood pressure requiring labetalol followed by nicardipine.  He has hx of DM and HTN, states well controlled. On ASA 325 prior to admission. He was administered IV tPA. CTA showed no large vessel occulusion, so not an intervention candidate. He was admitted to the ICU for further treatment and evaluation.  SUBJECTIVE No family at bedside. No new complaints.  OBJECTIVE Most recent Vital Signs: Filed Vitals:   02/07/14 0405 02/07/14 0700 02/07/14 0733 02/07/14 0844  BP: 169/72  185/77   Pulse: 61  50 55  Temp: 97.8 F (36.6 C) 97.5 F (36.4 C)    TempSrc: Oral Oral    Resp: 14  21 17   Height:      Weight:      SpO2: 99%  94% 96%   CBG (last 3)   Recent Labs  02/06/14 1651 02/06/14 2143 02/07/14 0733  GLUCAP 122* 165* 152*    IV Fluid Intake:   . niCARDipine 3 mg/hr (02/06/14 0426)    MEDICATIONS  . amLODipine  10 mg Oral Daily  . ARIPiprazole  2.5 mg Oral Daily  . clopidogrel  75 mg Oral Q breakfast  . enoxaparin (LOVENOX) injection  40 mg Subcutaneous Q24H  . valsartan  160 mg Oral Daily   And  . hydrochlorothiazide  12.5 mg Oral Daily  . insulin aspart  0-15 Units Subcutaneous TID WC  . metFORMIN  1,000 mg Oral Q breakfast  . nebivolol  5 mg Oral Daily  . pantoprazole  40 mg Oral QHS  . pioglitazone  15 mg Oral Daily  . sertraline  150 mg Oral Daily  . simvastatin  20 mg Oral q1800  . [START ON 02/09/2014] Vitamin D (Ergocalciferol)  50,000 Units Oral Q7 days   PRN:  acetaminophen, acetaminophen, labetalol, LORazepam  Diet:  Dysphagia 3 thin liquids Activity:  Up with assistance DVT Prophylaxis:  Lovenox 40 mg sq daily   CLINICALLY SIGNIFICANT STUDIES Basic Metabolic Panel:   Recent Labs Lab 02/03/14 0944 02/03/14 0958  NA 140 140  K 4.1 4.0  CL  102 104  CO2 24  --   GLUCOSE 278* 271*  BUN 16 17  CREATININE 1.22 1.30  CALCIUM 9.5  --    Liver Function Tests:   Recent Labs Lab 02/03/14 0944  AST 29  ALT 35  ALKPHOS 64  BILITOT 0.6  PROT 7.6  ALBUMIN 3.9   CBC:   Recent Labs Lab 02/03/14 0944 02/03/14 0958  WBC 8.6  --   NEUTROABS 4.8  --   HGB 15.6 16.0  HCT 43.0 47.0  MCV 86.7  --   PLT 186  --    Coagulation:   Recent Labs Lab 02/03/14 0944  LABPROT 12.8  INR 0.98   Cardiac Enzymes: No results found for this basename: CKTOTAL, CKMB, CKMBINDEX, TROPONINI,  in the last 168 hours Urinalysis: No results found for this basename: COLORURINE, APPERANCEUR, LABSPEC, PHURINE, GLUCOSEU, HGBUR, BILIRUBINUR, KETONESUR, PROTEINUR, UROBILINOGEN, NITRITE, LEUKOCYTESUR,  in the last 168 hours Lipid Panel    Component Value Date/Time   CHOL 177 02/04/2014 0243   TRIG 94 02/04/2014 0243   HDL 45 02/04/2014 0243   CHOLHDL 3.9 02/04/2014 0243   VLDL 19 02/04/2014 0243   LDLCALC 113* 02/04/2014 0243   HgbA1C  Lab Results  Component Value Date   HGBA1C 8.1* 02/06/2014    Urine Drug Screen:     Component Value Date/Time   LABOPIA NONE DETECTED 05/15/2012 2245   COCAINSCRNUR NONE DETECTED 05/15/2012 2245   LABBENZ NONE DETECTED 05/15/2012 2245   AMPHETMU NONE DETECTED 05/15/2012 2245   THCU NONE DETECTED 05/15/2012 2245   LABBARB NONE DETECTED 05/15/2012 2245    Alcohol Level: No results found for this basename: ETH,  in the last 168 hours   CT of the brain  02/03/2014    Atrophy with extensive supratentorial small vessel disease as well as prior infarct in the left pons. No acute appearing infarct seen. No hemorrhage or mass effect. Major intracranial vessels shows some increased attenuation, stable from prior study. The significance of this finding is questionable.    CT Angio Head and Neck 02/03/2014    1. 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 No arterial stenosis in the neck. No major circle of  Willis branch occlusion. 2. Mild irregularity of the left PCA P1 segment. Consider left thalamostriate type small vessel ischemia in this setting. 3. Stable CT appearance of the brain. No evidence of cortically based acute infarction identified. Salient findings on this study reviewed in person with   MRI of the brain 02/04/2014   2 x 2.5 cm region of acute infarction affecting the left basal ganglia and radiating white matter tracts. Mild swelling but no hemorrhage or mass effect.  Extensive chronic small vessel disease throughout the cerebral hemispheric white matter. 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     MRA of the brain  02/04/2014     Negative intracranial MR angiography of the large and medium size vessels.     2D Echocardiogram  EF 55-60% with no source of embolus.   Carotid Doppler  No evidence of hemodynamically significant internal carotid artery stenosis. Vertebral artery flow is antegrade.   CXR  02/03/2014   No interval change.  Cardiomegaly and left lower lobe atelectasis.     EKG  normal EKG, normal sinus rhythm, unchanged from previous tracings. For complete results please see formal report.   Therapy Recommendations CIR  Physical Exam   General: in bed  CV: RRR  Mental Status:  Patient is awake, alert, oriented to person, place, month, year, and situation.  Immediate and remote memory are intact.  Patient is able to give a clear and coherent history.  No signs of aphasia or neglect  +dysarthria  Cranial Nerves:  II: Visual Fields are full. Pupils are equal, round, and reactive to light. Discs are difficult to visualize.  III,IV, VI: EOMI without ptosis or diploplia.  V: Facial sensation is symmetric to temperature  VII: Facial movement is notable for R facial droop  VIII: hearing is intact to voice  X: Uvula elevates symmetrically  XI: Shoulder shrug is symmetric.  XII: tongue is midline without atrophy or fasciculations.  Motor:  Tone is normal. Bulk is normal. 5/5 strength  was present on the left, no movement right arm, able to lift leg off bed, but drifts down to bed.  Sensory:  Sensation is diminished to pin in right leg.  Deep Tendon Reflexes:  2+ and symmetric in the biceps and patellae.  Plantars:  Toes are upgoing bilaterally.  Cerebellar:  FNF and HKS are intact on the left  Gait:  Not assessed due to acute nature of evaluation and multiple medical monitors in ED setting.   ASSESSMENT Dakota Hall is a 63 y.o. male presenting with right  side weakness. Status post IV t-PA 02/03/2014 at 1023 without complication. Imaging confirms a left basal ganglia infarct. Infarct felt to be thrombotic secondary to small vessel disease. On aspirin 325 mg orally every day prior to admission. Now on clopidogrel 75 mg orally every day for secondary stroke prevention. Work up completed.  Malignant hypertension - on arrival BP 201/87, cardene used to control, only on 1/2 dose home BP meds. Remains on cardene this am with goal <160 Diabetes, uncontrolled HgbA1c 8.2, goal < 7.0  Hyperlipidemia, LDL 113, on no statin PTA, now on zocor 20 mg daily, goal LDL < 70 for diabetics obstructive sleep apnea, uses CPAP Obesity, Body mass index is 37.94 kg/(m^2). 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 Incidental small right parietal subcortical white matter also secondary to small vessel disease stroke 2013 Family hx stroke (father)  Hospital day # 4   TREATMENT/PLAN  Continue plavix for secondary stroke prevention  Transfer to the floor  Disposition: IP Rehab when bed available  Annie Main, MSN, RN, ANVP-BC, ANP-BC, GNP-BC [facility] Pager: [phone] 02/07/2014 9:54 AM  I have personally obtained a history, examined the patient, evaluated imaging results, and formulated the assessment and plan of care. I agree with the above.  Delia Heady, MD   To contact Stroke Continuity provider, please  refer to [email or web address]. After hours, contact General Neurology

## 2014-02-07 NOTE — Progress Notes (Signed)
Physical Therapy Treatment Patient Details Name: Dakota Hall MRN: [id number] DOB: 10/26/1951 Today's Date: 02/07/2014    History of Present Illness Dakota Hall is a 63 y.o.  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 CTA shows no large vessel occlusion and he is being admitted to the ICU for post TPA care given around 0900.     PT Comments    Pt adm with the above dx. Pt is progressing well towards functional goals. Pt tolerated dynamic standing activities well on this date. Pt's functional mobility continues to be limited by R hemiplegia. Pt continues to be a great candidate for CIR to maximize functional safety and mobility for safe return home. Pt to benefit from skilled acute PT to address limitations listed below.     Follow Up Recommendations  CIR     Equipment Recommendations       Recommendations for Other Services Rehab consult     Precautions / Restrictions Precautions Precautions: Fall Precaution Comments: Rt hemiparesis  Restrictions Weight Bearing Restrictions: No    Mobility  Bed Mobility Overal bed mobility: Needs Assistance Bed Mobility: Supine to Sit     Supine to sit: +2 for physical assistance;Mod assist     General bed mobility comments: pt unable to functional move R UE and LE. pt req mod A swinging legs off EOB and trunk control up to sitting. pt able to help pull self up with L UE  Transfers Overall transfer level: Needs assistance Equipment used: 2 person hand held assist Transfers: Sit to/from UGI CorporationStand;Stand Pivot Transfers Sit to Stand: Mod assist;Max assist;+2 physical assistance Stand pivot transfers: Mod assist;Max assist;+2 physical assistance       General transfer comment: completed three trials of sit to stand transfers. pt fluctuated between req mod to max A.    Ambulation/Gait                 Stairs            Wheelchair Mobility    Modified Rankin (Stroke Patients Only) Modified Rankin (Stroke Patients Only) Pre-Morbid  Rankin Score: No symptoms Modified Rankin: Severe disability     Balance Overall balance assessment: Needs assistance Sitting-balance support: Feet supported;No upper extremity supported Sitting balance-Leahy Scale: Fair Sitting balance - Comments: pt able to maintain upright positioning EOB with min guard   Standing balance support: During functional activity;Single extremity supported Standing balance-Leahy Scale: Zero Standing balance comment: 3rd person support behind for hip weight shift. req guarding R knee to assist during standing and marching activities. chair was placed in front of pt to provide UE supprt. pt performed 5 sets of 5 weight shifts and 5 sets of 5 marching in place to increase WB on R LE. pt tolerated2 trials of 5 min of standing. pt req max A x 2 for standing balance. verbal and tactile cues for weight shift and step sequencing                    Cognition Arousal/Alertness: Awake/alert Behavior During Therapy: Impulsive Overall Cognitive Status: Impaired/Different from baseline Area of Impairment: Safety/judgement         Safety/Judgement: Decreased awareness of safety     General Comments: pt slightly impulsive for transfer. req verbal and tactile cueing for safety    Exercises      General Comments        Pertinent Vitals/Pain Pt reports no pain     Home Living  Prior Function            PT Goals (current goals can now be found in the care plan section) Acute Rehab PT Goals Patient Stated Goal: to get home PT Goal Formulation: With patient Time For Goal Achievement: 02/19/14 Potential to Achieve Goals: Good Progress towards PT goals: Progressing toward goals    Frequency  Min 4X/week    PT Plan Current plan remains appropriate    End of Session Equipment Utilized During Treatment: Gait belt Activity Tolerance: Patient tolerated treatment well Patient left: in chair;with call bell/phone within  reach     Time: 0981-1914 PT Time Calculation (min): 39 min  Charges:                       G Codes:      Niyla Marone SPT 03-08-14, 4:21 PM

## 2014-02-07 NOTE — Clinical Social Work Placement (Signed)
Clinical Social Work Department CLINICAL SOCIAL WORK PLACEMENT NOTE 02/07/2014  Patient:  Dakota Hall,Dakota Hall  Account Number:  [id number] Admit date:  02/03/2014  Clinical Social Worker:  Cherre BlancJOSEPH BRYANT Abdurrahman [place], [place]  Date/time:  02/07/2014 07:54 PM  Clinical Social Work is seeking post-discharge placement for this patient at the following level of care:   SKILLED NURSING   (*CSW will update this form in Epic as items are completed)   02/07/2014  Patient/family provided with [facility] Department of Clinical Social Work's list of facilities offering this level of care within the geographic area requested by the patient (or if unable, by the patient's family).  02/07/2014  Patient/family informed of their freedom to choose among providers that offer the needed level of care, that participate in Medicare, Medicaid or managed care program needed by the patient, have an available bed and are willing to accept the patient.  02/07/2014  Patient/family informed of [facility]' ownership interest in [facility], as well as of the fact that they are under no obligation to receive care at this facility.  PASARR submitted to EDS on 02/07/2014 PASARR number received from EDS on 02/07/2014  FL2 transmitted to all facilities in geographic area requested by pt/family on  02/07/2014 FL2 transmitted to all facilities within larger geographic area on   Patient informed that his/her managed care company has contracts with or will negotiate with  certain facilities, including the following:     Patient/family informed of bed offers received:   Patient chooses bed at  Physician recommends and patient chooses bed at    Patient to be transferred to  on   Patient to be transferred to facility by   The following physician request were entered in Epic:   Additional Comments:   Roddie McBryant Drinda Belgard, [place], [place], [phone]

## 2014-02-07 NOTE — Progress Notes (Signed)
Physical Therapy Treatment Patient Details Name: Dakota Hall MRN: [id number] DOB: 01-Feb-1951 Today's Date: 02/07/2014    History of Present Illness Mr. DHRUVAN GULLION is a 63 y.o.  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 CTA shows no large vessel occlusion and he is being admitted to the ICU for post TPA care given around 0900.     PT Comments    Pt adm with the above dx. Pt is progressing well towards functional goals. Pt tolerated dynamic standing activities well on this date. Pt's functional mobility continues to be limited by R hemiplegia. Pt continues to be a great candidate for CIR to maximize functional safety and mobility for safe return home. Pt to benefit from skilled acute PT to address limitations listed below.     Follow Up Recommendations  CIR     Equipment Recommendations       Recommendations for Other Services Rehab consult     Precautions / Restrictions Precautions Precautions: Fall Precaution Comments: Rt hemiparesis  Restrictions Weight Bearing Restrictions: No    Mobility  Bed Mobility Overal bed mobility: Needs Assistance Bed Mobility: Supine to Sit     Supine to sit: +2 for physical assistance;Mod assist     General bed mobility comments: pt unable to functional move R UE and LE. pt req mod A swinging legs off EOB and trunk control up to sitting. pt able to help pull self up with L UE  Transfers Overall transfer level: Needs assistance Equipment used: 2 person hand held assist Transfers: Sit to/from [employer] Sit to Stand: Mod assist;Max assist;+2 physical assistance Stand pivot transfers: Mod assist;Max assist;+2 physical assistance       General transfer comment: completed three trials of sit to stand transfers. pt fluctuated between req mod to max A. maxA required to advance R LE during std pvt transfer.    Ambulation/Gait                 Stairs            Wheelchair Mobility    Modified Rankin (Stroke Patients  Only) Modified Rankin (Stroke Patients Only) Pre-Morbid Rankin Score: No symptoms Modified Rankin: Severe disability     Balance Overall balance assessment: Needs assistance Sitting-balance support: Feet supported;No upper extremity supported Sitting balance-Leahy Scale: Fair Sitting balance - Comments: pt able to maintain upright positioning EOB with min guard   Standing balance support: During functional activity;Single extremity supported Standing balance-Leahy Scale: Zero Standing balance comment: 3rd person support behind for hip weight shift. req guarding R knee to assist during standing and marching activities. chair was placed in front of pt to provide UE supprt. pt performed 5 sets of 5 weight shifts and 5 sets of 5 marching in place to increase WB on R LE. pt tolerated2 trials of 5 min of standing. pt req max A x 2 for standing balance. verbal and tactile cues for weight shift and step sequencing                    Cognition Arousal/Alertness: Awake/alert Behavior During Therapy: Impulsive Overall Cognitive Status: Impaired/Different from baseline Area of Impairment: Safety/judgement         Safety/Judgement: Decreased awareness of safety     General Comments: pt slightly impulsive for transfer. req verbal and tactile cueing for safety    Exercises      General Comments        Pertinent Vitals/Pain Pt reports no pain  Home Living                      Prior Function            PT Goals (current goals can now be found in the care plan section) Acute Rehab PT Goals Patient Stated Goal: to get home PT Goal Formulation: With patient Time For Goal Achievement: 02/19/14 Potential to Achieve Goals: Good Progress towards PT goals: Progressing toward goals    Frequency  Min 4X/week    PT Plan Current plan remains appropriate    End of Session Equipment Utilized During Treatment: Gait belt Activity Tolerance: Patient tolerated  treatment well Patient left: in chair;with call bell/phone within reach     Time: [id number] PT Time Calculation (min): 39 min  Charges:  $Therapeutic Activity: 8-22 mins $Neuromuscular Re-education: 23-37 mins                    G Codes:      Malachi ParadiseKohler, Andrew SPT 02/07/2014, 4:47 PM  Agree with above assessment.  Lewis ShockAshly Sarabella Caprio, PT, DPT Pager #: [phone] Office #: [phone]

## 2014-02-07 NOTE — Progress Notes (Signed)
PT Cancellation Note  Patient Details Name: Dakota Hall MRN: [id number] DOB: 06/06/1951   Cancelled Treatment:    Reason Eval/Treat Not Completed: Medical issues which prohibited therapy. Pt with HR in 40s, RN asked to hold until later. PT to return as able.   Marcene BrawnChadwell, Teagon Kron Marie 02/07/2014, 8:15 AM

## 2014-02-07 NOTE — Clinical Social Work Psychosocial (Signed)
Clinical Social Work Department BRIEF PSYCHOSOCIAL ASSESSMENT 02/07/2014  Patient:  Dakota Hall, Dakota Hall     Account Number:  [id number]     Admit date:  02/03/2014  Clinical Social Worker:  Lovey Newcomer  Date/Time:  02/07/2014 04:24 PM  Referred by:  Physician  Date Referred:  02/07/2014 Referred for  SNF Placement   Other Referral:   Interview type:  Patient Other interview type:   Patient alert and oriented at time of assessment.    PSYCHOSOCIAL DATA Living Status:  WIFE Admitted from facility:   Level of care:   Primary support name:  Mickel Baas Primary support relationship to patient:  SPOUSE Degree of support available:   Support is good.    CURRENT CONCERNS Current Concerns  Post-Acute Placement   Other Concerns:    SOCIAL WORK ASSESSMENT / PLAN CSW met with patient at bedside to complete assessment. CSW explained that patient is being considered by CIR but patient needs SNF backup plan since CIR is not a guarantee. Patient states that he is agreeable to SNF placement if CIR is not an option. Patient states that he lives with his wife and has two children named Corene Cornea and [place]. Patient does not report any past SNF stay and does not report a preference at this time.   Assessment/plan status:  Psychosocial Support/Ongoing Assessment of Needs Other assessment/ plan:   Complete FL2, Fax, PASRR   Information/referral to community resources:   CSW contact information and SNF list given to patient.    PATIENT'S/FAMILY'S RESPONSE TO PLAN OF CARE: Patient is agreeable to SNF placement if CIR is not an option. Patient was pleasant, appropriate, and appreciative of CSW contact. CSW will follow up with bed offers when available. Patient seems indifferent about where he gets rehab, but states that he is optimistic about his ability to recover.         Liz Beach, [place], [place], [phone]

## 2014-02-07 NOTE — Progress Notes (Signed)
Rehab admissions - Evaluated for possible admission.  I met with patient.  I gave him rehab brochures.  Patient would like to come to inpatient rehab here at [facility].  I will open the case with [employer].  I spoke with his wife by phone.  She will be here to visit after work today.  I will check back tomorrow after I hear back from insurance carrier.  Call me for questions.  #[phone]

## 2014-02-07 NOTE — Progress Notes (Signed)
Report given to Tommi Rumpse, RN on 4N. Patient to be transferred via bed to 4N10 by Mikeal Winstanley, RN and Misbah, NT with family at bedside.

## 2014-02-08 ENCOUNTER — Encounter (HOSPITAL_COMMUNITY): Payer: Self-pay | Admitting: Physical Medicine and Rehabilitation

## 2014-02-08 ENCOUNTER — Inpatient Hospital Stay (HOSPITAL_COMMUNITY)
Admission: RE | Admit: 2014-02-08 | Discharge: 2014-03-03 | DRG: 945 | Disposition: A | Discharge status: Skilled Nursing Facility | Payer: Managed Care, Other (non HMO) | Source: Intra-hospital | Attending: Physical Medicine & Rehabilitation | Admitting: Physical Medicine & Rehabilitation

## 2014-02-08 DIAGNOSIS — I639 Cerebral infarction, unspecified: Secondary | ICD-10-CM

## 2014-02-08 DIAGNOSIS — I69922 Dysarthria following unspecified cerebrovascular disease: Secondary | ICD-10-CM

## 2014-02-08 DIAGNOSIS — K5900 Constipation, unspecified: Secondary | ICD-10-CM | POA: Diagnosis present

## 2014-02-08 DIAGNOSIS — F32A Depression, unspecified: Secondary | ICD-10-CM

## 2014-02-08 DIAGNOSIS — M216X9 Other acquired deformities of unspecified foot: Secondary | ICD-10-CM | POA: Diagnosis present

## 2014-02-08 DIAGNOSIS — I6330 Cerebral infarction due to thrombosis of unspecified cerebral artery: Secondary | ICD-10-CM | POA: Diagnosis present

## 2014-02-08 DIAGNOSIS — E785 Hyperlipidemia, unspecified: Secondary | ICD-10-CM | POA: Diagnosis present

## 2014-02-08 DIAGNOSIS — I69959 Hemiplegia and hemiparesis following unspecified cerebrovascular disease affecting unspecified side: Secondary | ICD-10-CM

## 2014-02-08 DIAGNOSIS — R1313 Dysphagia, pharyngeal phase: Secondary | ICD-10-CM

## 2014-02-08 DIAGNOSIS — E119 Type 2 diabetes mellitus without complications: Secondary | ICD-10-CM

## 2014-02-08 DIAGNOSIS — F341 Dysthymic disorder: Secondary | ICD-10-CM | POA: Diagnosis present

## 2014-02-08 DIAGNOSIS — R471 Dysarthria and anarthria: Secondary | ICD-10-CM | POA: Diagnosis present

## 2014-02-08 DIAGNOSIS — Z8249 Family history of ischemic heart disease and other diseases of the circulatory system: Secondary | ICD-10-CM

## 2014-02-08 DIAGNOSIS — R4781 Slurred speech: Secondary | ICD-10-CM

## 2014-02-08 DIAGNOSIS — I498 Other specified cardiac arrhythmias: Secondary | ICD-10-CM | POA: Diagnosis not present

## 2014-02-08 DIAGNOSIS — I69991 Dysphagia following unspecified cerebrovascular disease: Secondary | ICD-10-CM

## 2014-02-08 DIAGNOSIS — G8190 Hemiplegia, unspecified affecting unspecified side: Secondary | ICD-10-CM | POA: Diagnosis present

## 2014-02-08 DIAGNOSIS — R29810 Facial weakness: Secondary | ICD-10-CM | POA: Diagnosis present

## 2014-02-08 DIAGNOSIS — R4701 Aphasia: Secondary | ICD-10-CM | POA: Diagnosis present

## 2014-02-08 DIAGNOSIS — F418 Other specified anxiety disorders: Secondary | ICD-10-CM | POA: Diagnosis present

## 2014-02-08 DIAGNOSIS — R259 Unspecified abnormal involuntary movements: Secondary | ICD-10-CM | POA: Diagnosis present

## 2014-02-08 DIAGNOSIS — I10 Essential (primary) hypertension: Secondary | ICD-10-CM

## 2014-02-08 DIAGNOSIS — F329 Major depressive disorder, single episode, unspecified: Secondary | ICD-10-CM

## 2014-02-08 DIAGNOSIS — Z880 Allergy status to penicillin: Secondary | ICD-10-CM

## 2014-02-08 DIAGNOSIS — G4733 Obstructive sleep apnea (adult) (pediatric): Secondary | ICD-10-CM | POA: Diagnosis present

## 2014-02-08 DIAGNOSIS — Z8673 Personal history of transient ischemic attack (TIA), and cerebral infarction without residual deficits: Secondary | ICD-10-CM

## 2014-02-08 DIAGNOSIS — R4587 Impulsiveness: Secondary | ICD-10-CM | POA: Diagnosis present

## 2014-02-08 DIAGNOSIS — E669 Obesity, unspecified: Secondary | ICD-10-CM | POA: Diagnosis present

## 2014-02-08 DIAGNOSIS — Z7902 Long term (current) use of antithrombotics/antiplatelets: Secondary | ICD-10-CM

## 2014-02-08 DIAGNOSIS — Z5189 Encounter for other specified aftercare: Principal | ICD-10-CM

## 2014-02-08 DIAGNOSIS — Z79899 Other long term (current) drug therapy: Secondary | ICD-10-CM

## 2014-02-08 LAB — GLUCOSE, CAPILLARY
GLUCOSE-CAPILLARY: 112 mg/dL — AB (ref 70–99)
Glucose-Capillary: 127 mg/dL — ABNORMAL HIGH (ref 70–99)
Glucose-Capillary: 132 mg/dL — ABNORMAL HIGH (ref 70–99)
Glucose-Capillary: 170 mg/dL — ABNORMAL HIGH (ref 70–99)

## 2014-02-08 MED ORDER — BISACODYL 10 MG RE SUPP: 10.0000 mg | Freq: Every day | RECTAL | Status: DC | PRN

## 2014-02-08 MED ORDER — BACLOFEN 5 MG HALF TABLET
5.0000 mg | ORAL_TABLET | Freq: Two times a day (BID) | ORAL | Status: DC
  Administered 2014-02-08 – 2014-02-13 (×10): 5 mg via ORAL
  Filled 2014-02-08 (×14): qty 1

## 2014-02-08 MED ORDER — NEBIVOLOL HCL 5 MG PO TABS
5.0000 mg | ORAL_TABLET | Freq: Every day | ORAL | Status: DC
  Filled 2014-02-08 (×2): qty 1

## 2014-02-08 MED ORDER — GUAIFENESIN-DM 100-10 MG/5ML PO SYRP: 5.0000 mL | ORAL_SOLUTION | Freq: Four times a day (QID) | ORAL | Status: DC | PRN

## 2014-02-08 MED ORDER — METFORMIN HCL ER 500 MG PO TB24
1000.0000 mg | ORAL_TABLET | Freq: Every day | ORAL | Status: DC
  Administered 2014-02-09 – 2014-03-03 (×23): 1000 mg via ORAL
  Filled 2014-02-08 (×24): qty 2

## 2014-02-08 MED ORDER — SERTRALINE HCL 50 MG PO TABS
150.0000 mg | ORAL_TABLET | Freq: Every day | ORAL | Status: DC
  Administered 2014-02-09 – 2014-03-03 (×23): 150 mg via ORAL
  Filled 2014-02-08 (×24): qty 1

## 2014-02-08 MED ORDER — PROCHLORPERAZINE EDISYLATE 5 MG/ML IJ SOLN
5.0000 mg | Freq: Four times a day (QID) | INTRAMUSCULAR | Status: DC | PRN
  Filled 2014-02-08: qty 2

## 2014-02-08 MED ORDER — FLEET ENEMA 7-19 GM/118ML RE ENEM
1.0000 | ENEMA | Freq: Every day | RECTAL | Status: DC | PRN
  Administered 2014-02-15: 1 via RECTAL
  Filled 2014-02-08: qty 1

## 2014-02-08 MED ORDER — TRAZODONE HCL 50 MG PO TABS: 25.0000 mg | ORAL_TABLET | Freq: Every evening | ORAL | Status: DC | PRN

## 2014-02-08 MED ORDER — PIOGLITAZONE HCL 15 MG PO TABS
15.0000 mg | ORAL_TABLET | Freq: Every day | ORAL | Status: DC
  Administered 2014-02-09 – 2014-03-03 (×23): 15 mg via ORAL
  Filled 2014-02-08 (×24): qty 1

## 2014-02-08 MED ORDER — ACETAMINOPHEN 325 MG PO TABS: 325.0000 mg | ORAL_TABLET | ORAL | Status: DC | PRN

## 2014-02-08 MED ORDER — PROCHLORPERAZINE MALEATE 5 MG PO TABS
5.0000 mg | ORAL_TABLET | Freq: Four times a day (QID) | ORAL | Status: DC | PRN
  Filled 2014-02-08: qty 2

## 2014-02-08 MED ORDER — PROCHLORPERAZINE 25 MG RE SUPP
12.5000 mg | Freq: Four times a day (QID) | RECTAL | Status: DC | PRN
  Filled 2014-02-08: qty 1

## 2014-02-08 MED ORDER — HYDROCHLOROTHIAZIDE 12.5 MG PO CAPS
12.5000 mg | ORAL_CAPSULE | Freq: Every day | ORAL | Status: DC
  Administered 2014-02-09 – 2014-03-03 (×23): 12.5 mg via ORAL
  Filled 2014-02-08 (×24): qty 1

## 2014-02-08 MED ORDER — SIMVASTATIN 20 MG PO TABS
20.0000 mg | ORAL_TABLET | Freq: Every day | ORAL | Status: DC
  Administered 2014-02-08 – 2014-03-02 (×23): 20 mg via ORAL
  Filled 2014-02-08 (×24): qty 1

## 2014-02-08 MED ORDER — PANTOPRAZOLE SODIUM 40 MG PO TBEC
40.0000 mg | DELAYED_RELEASE_TABLET | Freq: Every day | ORAL | Status: DC
  Administered 2014-02-08 – 2014-03-02 (×23): 40 mg via ORAL
  Filled 2014-02-08 (×23): qty 1

## 2014-02-08 MED ORDER — DIPHENHYDRAMINE HCL 12.5 MG/5ML PO ELIX: 12.5000 mg | ORAL_SOLUTION | Freq: Four times a day (QID) | ORAL | Status: DC | PRN

## 2014-02-08 MED ORDER — ALUM & MAG HYDROXIDE-SIMETH 200-200-20 MG/5ML PO SUSP: 30.0000 mL | ORAL | Status: DC | PRN

## 2014-02-08 MED ORDER — AMLODIPINE BESYLATE 10 MG PO TABS
10.0000 mg | ORAL_TABLET | Freq: Every day | ORAL | Status: DC
  Administered 2014-02-09 – 2014-03-01 (×21): 10 mg via ORAL
  Filled 2014-02-08 (×22): qty 1

## 2014-02-08 MED ORDER — POLYETHYLENE GLYCOL 3350 17 G PO PACK
17.0000 g | PACK | Freq: Every day | ORAL | Status: DC
  Administered 2014-02-08 – 2014-03-01 (×16): 17 g via ORAL
  Filled 2014-02-08 (×25): qty 1

## 2014-02-08 MED ORDER — ENOXAPARIN SODIUM 30 MG/0.3ML ~~LOC~~ SOLN
30.0000 mg | SUBCUTANEOUS | Status: DC
  Filled 2014-02-08: qty 0.3

## 2014-02-08 MED ORDER — LORAZEPAM 0.5 MG PO TABS: 0.5000 mg | ORAL_TABLET | Freq: Four times a day (QID) | ORAL | Status: DC | PRN

## 2014-02-08 MED ORDER — ENOXAPARIN SODIUM 30 MG/0.3ML ~~LOC~~ SOLN
30.0000 mg | Freq: Every day | SUBCUTANEOUS | Status: DC
  Administered 2014-02-09 – 2014-03-03 (×23): 30 mg via SUBCUTANEOUS
  Filled 2014-02-08 (×24): qty 0.3

## 2014-02-08 MED ORDER — CLOPIDOGREL BISULFATE 75 MG PO TABS
75.0000 mg | ORAL_TABLET | Freq: Every day | ORAL | Status: DC
  Administered 2014-02-09 – 2014-03-03 (×23): 75 mg via ORAL
  Filled 2014-02-08 (×23): qty 1

## 2014-02-08 MED ORDER — ARIPIPRAZOLE 5 MG PO TABS
2.5000 mg | ORAL_TABLET | Freq: Every day | ORAL | Status: DC
  Administered 2014-02-09 – 2014-02-19 (×11): 2.5 mg via ORAL
  Administered 2014-02-20: 09:00:00 via ORAL
  Administered 2014-02-21 – 2014-02-26 (×6): 2.5 mg via ORAL
  Administered 2014-02-27: 09:00:00 via ORAL
  Administered 2014-02-28 – 2014-03-03 (×4): 2.5 mg via ORAL
  Filled 2014-02-08 (×24): qty 1

## 2014-02-08 MED ORDER — INSULIN ASPART 100 UNIT/ML ~~LOC~~ SOLN
0.0000 [IU] | Freq: Three times a day (TID) | SUBCUTANEOUS | Status: DC
  Administered 2014-02-09 (×3): 2 [IU] via SUBCUTANEOUS
  Administered 2014-02-10: 3 [IU] via SUBCUTANEOUS
  Administered 2014-02-11 – 2014-02-12 (×3): 2 [IU] via SUBCUTANEOUS
  Administered 2014-02-12: 3 [IU] via SUBCUTANEOUS
  Administered 2014-02-13 – 2014-02-16 (×6): 2 [IU] via SUBCUTANEOUS
  Administered 2014-02-16 – 2014-02-17 (×2): 3 [IU] via SUBCUTANEOUS
  Administered 2014-02-18 – 2014-02-28 (×16): 2 [IU] via SUBCUTANEOUS

## 2014-02-08 MED ORDER — METHOCARBAMOL 500 MG PO TABS: 500.0000 mg | ORAL_TABLET | Freq: Four times a day (QID) | ORAL | Status: DC | PRN

## 2014-02-08 MED ORDER — VALSARTAN 160 MG PO TABS
160.0000 mg | ORAL_TABLET | Freq: Every day | ORAL | Status: DC
  Administered 2014-02-09 – 2014-03-03 (×23): 160 mg via ORAL
  Filled 2014-02-08 (×24): qty 1

## 2014-02-08 MED ORDER — VITAMIN D (ERGOCALCIFEROL) 1.25 MG (50000 UNIT) PO CAPS
50000.0000 [IU] | ORAL_CAPSULE | ORAL | Status: DC
  Administered 2014-02-09 – 2014-03-02 (×4): 50000 [IU] via ORAL
  Filled 2014-02-08 (×4): qty 1

## 2014-02-08 NOTE — Progress Notes (Signed)
Rehab admissions - [facility] for today.  I spoke with patient and he is in agreement.  Bed available and will admit to acute inpatient rehab today.  Call me for questions.  #[phone]

## 2014-02-08 NOTE — Progress Notes (Signed)
Stroke Team Progress Note  HISTORY 63 y.o. male with a history of two previous strokes who presents with sudden onset right hemiparesis at 8:40am on 02/03/2014. After arrival, the patient had severely elevated blood pressure requiring labetalol followed by nicardipine.  He has hx of DM and HTN, states well controlled. On ASA 325 prior to admission. He was administered IV tPA. CTA showed no large vessel occulusion, so not an intervention candidate. He was admitted to the ICU for further treatment and evaluation.  SUBJECTIVE No complaints. Ready for IP rehab.  OBJECTIVE Most recent Vital Signs: Filed Vitals:   02/07/14 2101 02/08/14 0132 02/08/14 0538 02/08/14 0945  BP: 176/77 156/68 157/63 176/62  Pulse: 52 44 45 54  Temp: 98.4 F (36.9 C) 97.5 F (36.4 C) 97.4 F (36.3 C) 97.5 F (36.4 C)  TempSrc: Oral Oral Oral Oral  Resp: 20 18 18 18   Height: 5\' 9"  (1.753 m)     Weight: 51.12 kg (112 lb 11.2 oz)     SpO2: 96% 98% 98% 97%   CBG (last 3)   Recent Labs  02/07/14 1659 02/07/14 2256 02/08/14 0657  GLUCAP 128* 132* 127*    IV Fluid Intake:   . niCARDipine 3 mg/hr (02/06/14 0426)    MEDICATIONS  . amLODipine  10 mg Oral Daily  . ARIPiprazole  2.5 mg Oral Daily  . clopidogrel  75 mg Oral Q breakfast  . enoxaparin (LOVENOX) injection  40 mg Subcutaneous Q24H  . valsartan  160 mg Oral Daily   And  . hydrochlorothiazide  12.5 mg Oral Daily  . insulin aspart  0-15 Units Subcutaneous TID WC  . metFORMIN  1,000 mg Oral Q breakfast  . nebivolol  5 mg Oral Daily  . pantoprazole  40 mg Oral QHS  . pioglitazone  15 mg Oral Daily  . sertraline  150 mg Oral Daily  . simvastatin  20 mg Oral q1800  . [START ON 02/09/2014] Vitamin D (Ergocalciferol)  50,000 Units Oral Q7 days   PRN:  acetaminophen, acetaminophen, labetalol, LORazepam  Diet:  Dysphagia 3 thin liquids Activity:  Up with assistance DVT Prophylaxis:  Lovenox 40 mg sq daily   CLINICALLY SIGNIFICANT STUDIES Basic  Metabolic Panel:   Recent Labs Lab 02/03/14 0944 02/03/14 0958  NA 140 140  K 4.1 4.0  CL 102 104  CO2 24  --   GLUCOSE 278* 271*  BUN 16 17  CREATININE 1.22 1.30  CALCIUM 9.5  --    Liver Function Tests:   Recent Labs Lab 02/03/14 0944  AST 29  ALT 35  ALKPHOS 64  BILITOT 0.6  PROT 7.6  ALBUMIN 3.9   CBC:   Recent Labs Lab 02/03/14 0944 02/03/14 0958  WBC 8.6  --   NEUTROABS 4.8  --   HGB 15.6 16.0  HCT 43.0 47.0  MCV 86.7  --   PLT 186  --    Coagulation:   Recent Labs Lab 02/03/14 0944  LABPROT 12.8  INR 0.98   Cardiac Enzymes: No results found for this basename: CKTOTAL, CKMB, CKMBINDEX, TROPONINI,  in the last 168 hours Urinalysis: No results found for this basename: COLORURINE, APPERANCEUR, LABSPEC, PHURINE, GLUCOSEU, HGBUR, BILIRUBINUR, KETONESUR, PROTEINUR, UROBILINOGEN, NITRITE, LEUKOCYTESUR,  in the last 168 hours Lipid Panel    Component Value Date/Time   CHOL 177 02/04/2014 0243   TRIG 94 02/04/2014 0243   HDL 45 02/04/2014 0243   CHOLHDL 3.9 02/04/2014 0243   VLDL 19 02/04/2014 0243  LDLCALC 113* 02/04/2014 0243   HgbA1C  Lab Results  Component Value Date   HGBA1C 8.1* 02/06/2014    Urine Drug Screen:     Component Value Date/Time   LABOPIA NONE DETECTED 05/15/2012 2245   COCAINSCRNUR NONE DETECTED 05/15/2012 2245   LABBENZ NONE DETECTED 05/15/2012 2245   AMPHETMU NONE DETECTED 05/15/2012 2245   THCU NONE DETECTED 05/15/2012 2245   LABBARB NONE DETECTED 05/15/2012 2245    Alcohol Level: No results found for this basename: ETH,  in the last 168 hours   CT of the brain  02/03/2014    Atrophy with extensive supratentorial small vessel disease as well as prior infarct in the left pons. No acute appearing infarct seen. No hemorrhage or mass effect. Major intracranial vessels shows some increased attenuation, stable from prior study. The significance of this finding is questionable.    CT Angio Head and Neck 02/03/2014    1. 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 No arterial stenosis in the neck. No major circle of Willis branch occlusion. 2. Mild irregularity of the left PCA P1 segment. Consider left thalamostriate type small vessel ischemia in this setting. 3. Stable CT appearance of the brain. No evidence of cortically based acute infarction identified. Salient findings on this study reviewed in person with   MRI of the brain 02/04/2014   2 x 2.5 cm region of acute infarction affecting the left basal ganglia and radiating white matter tracts. Mild swelling but no hemorrhage or mass effect.  Extensive chronic small vessel disease throughout the cerebral hemispheric white matter. 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     MRA of the brain  02/04/2014     Negative intracranial MR angiography of the large and medium size vessels.     2D Echocardiogram  EF 55-60% with no source of embolus.   Carotid Doppler  No evidence of hemodynamically significant internal carotid artery stenosis. Vertebral artery flow is antegrade.   CXR  02/03/2014   No interval change.  Cardiomegaly and left lower lobe atelectasis.     EKG  normal EKG, normal sinus rhythm, unchanged from previous tracings. For complete results please see formal report.   Therapy Recommendations CIR  Physical Exam   General: in bed  CV: RRR  Mental Status:  Patient is awake, alert, oriented to person, place, month, year, and situation.  Immediate and remote memory are intact.  Patient is able to give a clear and coherent history.  No signs of aphasia or neglect  +dysarthria  Cranial Nerves:  II: Visual Fields are full. Pupils are equal, round, and reactive to light. Discs are difficult to visualize.  III,IV, VI: EOMI without ptosis or diploplia.  V: Facial sensation is symmetric to temperature  VII: Facial movement is notable for R facial droop  VIII: hearing is intact to voice  X: Uvula elevates symmetrically  XI: Shoulder shrug is symmetric.  XII: tongue  is midline without atrophy or fasciculations.  Motor:  Tone is normal. Bulk is normal. 5/5 strength was present on the left, no movement right arm, able to lift leg off bed, but drifts down to bed.  Sensory:  Sensation is diminished to pin in right leg.  Deep Tendon Reflexes:  2+ and symmetric in the biceps and patellae.  Plantars:  Toes are upgoing bilaterally.  Cerebellar:  FNF and HKS are intact on the left  Gait:  Not assessed due to acute nature of evaluation and multiple medical monitors in ED setting.   ASSESSMENT Mr.  Dakota Hall is a 63 y.o. male presenting with right side weakness. Status post IV t-PA 02/03/2014 at 1023 without complication. Imaging confirms a left basal ganglia infarct. Infarct felt to be thrombotic secondary to small vessel disease. On aspirin 325 mg orally every day prior to admission. Now on clopidogrel 75 mg orally every day for secondary stroke prevention. Work up completed.  Malignant hypertension - on arrival BP 201/87, cardene used to control, only on 1/2 dose home BP meds. Remains on cardene this am with goal <160 Diabetes, uncontrolled HgbA1c 8.2, goal < 7.0  Hyperlipidemia, LDL 113, on no statin PTA, now on zocor 20 mg daily, goal LDL < 70 for diabetics obstructive sleep apnea, uses CPAP Obesity, Body mass index is 16.64 kg/(m^2). 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 Incidental small right parietal subcortical white matter also secondary to small vessel disease Family hx stroke (father)  Hospital day # 5   TREATMENT/PLAN  Continue plavix for secondary stroke prevention  Discharge to IP rehab today  Annie MainSHARON BIBY, MSN, RN, ANVP-BC, ANP-BC, Lawernce IonGNP-BC [facility] Pager: [phone] 02/08/2014 11:01 AM  I have personally obtained a history, examined the patient, evaluated imaging results, and formulated the assessment and plan of care. I agree with the above. Delia HeadyPramod Sethi,  MD   To contact Stroke Continuity provider, please refer to [email or web address]. After hours, contact General Neurology

## 2014-02-08 NOTE — Progress Notes (Signed)
Patient had already placed himself on CPAP when RT entered room. Advised the patient to call if anything was needed. RT will continue to monitor.

## 2014-02-08 NOTE — PMR Pre-admission (Signed)
PMR Admission Coordinator Pre-Admission Assessment  Patient: Dakota Hall is an 63 y.o., male MRN: [id number] DOB: [birth year] Height: _0  (175.3 cm) Weight: 51.12 kg (112 lb 11.2 oz)              Insurance Information HMO:      PPO:       PCP:       IPA:       80/20:       OTHER:  Group # [id number] PRIMARY: [employer] managed      Policy#: [id number]      Subscriber: Sylvester Harder CM Name: Rayetta Humphrey      Phone#: [phone]     Fax#: [phone] Pre-Cert#: [id number] with update due in 7 days on 02/15/14      Employer:  [employer] Benefits:  Phone #: [phone]     Name: Automated Eff. Date: 11/18/11     Deduct: $1500 (met $479.36)      Out of Pocket Max: $3000 (met $479.36)      Life Max: unlimited CIR: 90%      SNF: 90% with 60 days max Outpatient: 90%     Co-Pay: 10% Home Health: 90%      Co-Pay: 10% DME: 90%     Co-Pay: 10% Providers: in network   Emergency Contact Information Contact Information   Name Relation Home Work [place] [phone]   Renee, Beale   [phone]     Current Medical History  Patient Admitting Diagnosis: Left basal ganglia infarct   History of Present Illness: A 63 y.o. right-handed male with history of CVA x2, DM type 2, OSA, who was admitted 02/03/2014 with acute onset right-sided weakness, right facial weakness with difficulty speaking and was noted to have severely elevated BP requiring labetalol and nicardipine. CCT negative for acute changes. CTA head/neck with mild irregularity of Left PCA question thalamostriate type ischemia, no large vessel stenosis. Patient treated with TPA and MRI of the head 2x2.5 cm acute infarct left basal ganglia radiating to white matter tracts. MRA of the head with no major occlusion or stenosis. Echocardiogram ejection fraction 60% without emboli. Carotid Dopplers with no ICA stenosis. Neurology following and recommended changing ASA to Plavix  therapy for thrombotic stroke due to SVD. Swallow evaluation done and showed evidence of pharyngeal dysphagia with delay in swallow and was placed on dysphagia 3, thin liquids. Patient with resultant dense right hemiparesis, poor safety awareness and moderate dysarthria impacting articulatory and respiratory processes.      Total: 6=NIH  Past Medical History  Past Medical History  Diagnosis Date  . Diabetes mellitus   . Hypertension   . Stroke July 2013    left paramedian pontine, incidental right parietal subcortical. both d/t small vessel disease  . Stroke March 2015    left basal ganglia secondary to small vessel disease  . Obesity     Family History  family history is not on file.  Prior Rehab/Hospitalizations:  None, but has had previous CVAs.   Current Medications  Current facility-administered medications:acetaminophen (TYLENOL) suppository 650 mg, 650 mg, Rectal, Q4H PRN, Roland Rack, MD;  acetaminophen (TYLENOL) tablet 650 mg, 650 mg, Oral, Q4H PRN, Roland Rack, MD;  amLODipine (NORVASC) tablet 10 mg, 10 mg, Oral, Daily, Donzetta Starch, NP, 10 mg at 02/08/14 0923;  ARIPiprazole (ABILIFY) tablet 2.5 mg, 2.5 mg, Oral, Daily, David L Rinehuls, PA-C, 2.5 mg at 02/08/14 0951 clopidogrel (PLAVIX) tablet 75  mg, 75 mg, Oral, Q breakfast, Leotis Pain, MD, 75 mg at 02/08/14 0952;  enoxaparin (LOVENOX) injection 40 mg, 40 mg, Subcutaneous, Q24H, Donzetta Starch, NP, 40 mg at 02/08/14 0954;  hydrochlorothiazide (MICROZIDE) capsule 12.5 mg, 12.5 mg, Oral, Daily, Garvin Fila, MD, 12.5 mg at 02/08/14 1610 insulin aspart (novoLOG) injection 0-15 Units, 0-15 Units, Subcutaneous, TID WC, David L Rinehuls, PA-C, 2 Units at 02/08/14 0800;  labetalol (NORMODYNE,TRANDATE) injection 10 mg, 10 mg, Intravenous, Q10 min PRN, Roland Rack, MD, 10 mg at 02/03/14 1952;  LORazepam (ATIVAN) tablet 1 mg, 1 mg, Oral, Q4H PRN, Wallie Char, 1 mg at 02/05/14 2028 metFORMIN  (GLUCOPHAGE-XR) 24 hr tablet 1,000 mg, 1,000 mg, Oral, Q breakfast, Donzetta Starch, NP, 1,000 mg at 02/08/14 9604;  nebivolol (BYSTOLIC) tablet 5 mg, 5 mg, Oral, Daily, Donzetta Starch, NP, 5 mg at 02/08/14 0953;  niCARdipine (CARDENE-IV) infusion (0.1 mg/ml), 3-15 mg/hr, Intravenous, Continuous, Wallie Char, Last Rate: 30 mL/hr at 02/06/14 0426, 3 mg/hr at 02/06/14 0426 pantoprazole (PROTONIX) EC tablet 40 mg, 40 mg, Oral, QHS, Roland Rack, MD, 40 mg at 02/07/14 2133;  pioglitazone (ACTOS) tablet 15 mg, 15 mg, Oral, Daily, Donzetta Starch, NP, 15 mg at 02/08/14 5409;  sertraline (ZOLOFT) tablet 150 mg, 150 mg, Oral, Daily, Donzetta Starch, NP, 150 mg at 02/08/14 8119;  simvastatin (ZOCOR) tablet 20 mg, 20 mg, Oral, q1800, Donzetta Starch, NP, 20 mg at 02/07/14 1859 valsartan (DIOVAN) 160 MG tablet 160 mg, 160 mg, Oral, Daily, Garvin Fila, MD, 160 mg at 02/08/14 1478;  [START ON 02/09/2014] Vitamin D (Ergocalciferol) (DRISDOL) capsule 50,000 Units, 50,000 Units, Oral, Q7 days, Early Chars Rinehuls, PA-C  Patients Current Diet: Dysphagia  Precautions / Restrictions Precautions Precautions: Fall Precaution Comments: Rt hemiparesis  Restrictions Weight Bearing Restrictions: No   Prior Activity Level Community (5-7x/wk): Went out daily.  Worked FT for [employer].  Home Assistive Devices / Equipment Home Assistive Devices/Equipment: Dentures (specify type) (lower) Home Equipment: Shower seat - built in  Prior Functional Level Prior Function Level of Independence: Independent Comments: PTA pt very independent and active; still drove and mowed lawn   Current Functional Level Cognition  Arousal/Alertness: Awake/alert Overall Cognitive Status: Impaired/Different from baseline Orientation Level: Oriented X4 Safety/Judgement: Decreased awareness of safety General Comments: pt slightly impulsive for transfer. req verbal and tactile cueing for safety Attention:  Sustained Sustained Attention: Appears intact Memory:  (TBA) Awareness: Appears intact Problem Solving:  (TBA further) Safety/Judgment: Appears intact    Extremity Assessment (includes Sensation/Coordination)  Upper Extremity Assessment  Upper Extremity Assessment: Defer to OT evaluation (Rt hemiparesis; hand edematous)  Lower Extremity Assessment  Lower Extremity Assessment: RLE deficits/detail  RLE Deficits / Details: able to perform SLR in bed and demo 3-/5 strength in quad in sitting; 2+ in Rt ankle DF  RLE Sensation: (sensation intact to light touch)  Cervical / Trunk Assessment  Cervical / Trunk Assessment: Normal      ADLs  Eating/Feeding: Minimal assistance (with L hand)  Where Assessed - Eating/Feeding: Bed level  Grooming: Wash/dry face;Teeth care;Minimal assistance  Where Assessed - Grooming: Supine, head of bed up  Upper Body Bathing: Maximal assistance  Where Assessed - Upper Body Bathing: Supine, head of bed up  Lower Body Bathing: +1 Total assistance  Where Assessed - Lower Body Bathing: Supine, head of bed up;Rolling right and/or left  Upper Body Dressing: Maximal assistance  Where Assessed - Upper Body Dressing: Supine, head of  bed up  Lower Body Dressing: +1 Total assistance  Where Assessed - Lower Body Dressing: Supine, head of bed up;Rolling right and/or left  ADL Comments: Began instruction in hemi techniques for ADL. Instructed pt and wife in protection of R UE during bed mobility, positioning, and edema management.      Mobility  Overal bed mobility: Needs Assistance Bed Mobility: Supine to Sit Rolling: Mod assist (heavy use of rail) Supine to sit: +2 for physical assistance;Mod assist Sit to supine: Mod assist;+2 for physical assistance General bed mobility comments: pt unable to functional move R UE and LE. pt req mod A swinging legs off EOB and trunk control up to sitting. pt able to help pull self up with L UE    Transfers  Overall transfer  level: Needs assistance Equipment used: 2 person hand held assist Transfers: Sit to/from [facility] Sit to Stand: Mod assist;Max assist;+2 physical assistance Stand pivot transfers: Mod assist;Max assist;+2 physical assistance General transfer comment: completed three trials of sit to stand transfers. pt fluctuated between req mod to max A. maxA required to advance R LE during std pvt transfer.      Ambulation / Gait / Stairs / Wheelchair Mobility  Not tried.    Posture / Balance Dynamic Sitting Balance Sitting balance - Comments: pt able to maintain upright positioning EOB with min guard    Special needs/care consideration BiPAP/CPAP Yes, has CPAP at the bedside and uses it at home CPM No Continuous Drip IV No Dialysis No       Life Vest No Oxygen No Special Bed No Trach Size No Wound Vac (area) No     Skin No                            Bowel mgmt: No BM since 02/02/14 Bladder mgmt: Condom cath in place. Diabetic mgmt: Yes    Previous Home Environment Living Arrangements: Spouse/significant other  Lives With: Spouse Available Help at Discharge: Family;Available 24 hours/day Type of Home: House Home Layout: Two level;Bed/bath upstairs;1/2 bath on main level Alternate Level Stairs-Rails: Can reach both;Left;Right Alternate Level Stairs-Number of Steps: flight Home Access: Stairs to enter Entrance Stairs-Rails: Left;Right;Can reach both Technical brewer of Steps: 4 Bathroom Shower/Tub: Multimedia programmer: Standard Home Care Services: No  Discharge Living Setting Plans for Discharge Living Setting: Patient's home;House;Lives with (comment) (Lives with wife.  wife works days.) Type of Home at Discharge: House Discharge Home Layout: Two level;1/2 bath on main level;Bed/bath upstairs Alternate Level Stairs-Number of Steps: Flight Discharge Home Access: Stairs to enter [employer] of Steps: 3 steps at garage and 4 steps at front  entrance. Does the patient have any problems obtaining your medications?: No  Social/Family/Support Systems Patient Roles: Spouse;Parent (Has a son, Corene Cornea and a Dtr, Vicente Males.) Contact Information: Nefi Musich - wife Anticipated Caregiver: wife Anticipated Caregiver's Contact Information: Mickel Baas (c) [phone] Ability/Limitations of Caregiver: Wife works Medical laboratory scientific officer.  In about a month, she will be eligible for FMLA again.  Wife had knee surgery last year.  Wife hopes to take time off to care for patient after rehab. Caregiver Availability: Other (Comment) (Unsure of caregiver other than wife potentially.) Discharge Plan Discussed with Primary Caregiver: Yes Is Caregiver In Agreement with Plan?: Yes Does Caregiver/Family have Issues with Lodging/Transportation while Pt is in Rehab?: No  Goals/Additional Needs Patient/Family Goal for Rehab: PT/OT min A, ST mod I goals Expected length of stay: 20-24 days  Cultural Considerations: [employer] Dietary Needs: Dys 3, thin liquids Equipment Needs: TBD Pt/Family Agrees to Admission and willing to participate: Yes Program Orientation Provided & Reviewed with Pt/Caregiver Including Roles  & Responsibilities: Yes  Decrease burden of Care through IP rehab admission: N/A  Possible need for SNF placement upon discharge: Yes, if patient does not progress well and/or wife cannot take FMLA from work.  Patient Condition: This patient's condition remains as documented in the consult dated 02/06/14, in which the Rehabilitation Physician determined and documented that the patient's condition is appropriate for intensive rehabilitative care in an inpatient rehabilitation facility. Will admit to inpatient rehab today.  Preadmission Screen Completed By:  Retta Diones, 02/08/2014 10:39 AM ______________________________________________________________________   Discussed status with Dr. Naaman Plummer on 02/08/14 at 1115 and received telephone approval for admission  today.  Admission Coordinator:  Retta Diones, time1115/Date03/25/15

## 2014-02-08 NOTE — Discharge Summary (Signed)
Stroke Discharge Summary  Patient ID: Dakota Hall   MRN: [id number]      DOB: August 15, 1951  Date of Admission: 02/03/2014 Date of Discharge: 02/08/2014  Attending Physician:  Darcella Cheshire, MD, Stroke MD  Consulting Physician(s):    Faith Rogue, MD (Physical Medicine & Rehabtilitation)  Patient's PCP:  Darnelle Bos, MD  Discharge Diagnoses:  Principal Problem:   Stroke - left basal ganglia. Ischemic Infarct s/p IV tpa, thrombotic secondary to small vessel disease.  Active Problems:   DM (diabetes mellitus), uncontrolled    Malignant hypertension   Depression with anxiety   Hyperlipidemia   Obstructive sleep apnea,    Obesity, BMI  Body mass index is 37.29 kg/(m^2).   Past Medical History  Diagnosis Date  . Diabetes mellitus   . Hypertension   . Stroke July 2013    left paramedian pontine, incidental right parietal subcortical. both d/t small vessel disease  . Stroke March 2015    left basal ganglia secondary to small vessel disease  . Obesity   . Depression with anxiety   . OSA on CPAP    Past Surgical History  Procedure Laterality Date  . Tonsillectomy    . Anal fissure repair      Medications to be continued on Rehab . amLODipine  10 mg Oral Daily  . ARIPiprazole  2.5 mg Oral Daily  . clopidogrel  75 mg Oral Q breakfast  . enoxaparin (LOVENOX) injection  40 mg Subcutaneous Q24H  . valsartan  160 mg Oral Daily   And  . hydrochlorothiazide  12.5 mg Oral Daily  . insulin aspart  0-15 Units Subcutaneous TID WC  . metFORMIN  1,000 mg Oral Q breakfast  . nebivolol  5 mg Oral Daily  . pantoprazole  40 mg Oral QHS  . pioglitazone  15 mg Oral Daily  . sertraline  150 mg Oral Daily  . simvastatin  20 mg Oral q1800  . [START ON 02/09/2014] Vitamin D (Ergocalciferol)  50,000 Units Oral Q7 days    LABORATORY STUDIES CBC    Component Value Date/Time   WBC 8.6 02/03/2014 0944   RBC 4.96 02/03/2014 0944   HGB 16.0 02/03/2014 0958   HCT 47.0 02/03/2014  0958   PLT 186 02/03/2014 0944   MCV 86.7 02/03/2014 0944   MCH 31.5 02/03/2014 0944   MCHC 36.3* 02/03/2014 0944   RDW 13.0 02/03/2014 0944   LYMPHSABS 3.3 02/03/2014 0944   MONOABS 0.5 02/03/2014 0944   EOSABS 0.1 02/03/2014 0944   BASOSABS 0.0 02/03/2014 0944   CMP    Component Value Date/Time   NA 140 02/03/2014 0958   K 4.0 02/03/2014 0958   CL 104 02/03/2014 0958   CO2 24 02/03/2014 0944   GLUCOSE 271* 02/03/2014 0958   BUN 17 02/03/2014 0958   CREATININE 1.30 02/03/2014 0958   CALCIUM 9.5 02/03/2014 0944   PROT 7.6 02/03/2014 0944   ALBUMIN 3.9 02/03/2014 0944   AST 29 02/03/2014 0944   ALT 35 02/03/2014 0944   ALKPHOS 64 02/03/2014 0944   BILITOT 0.6 02/03/2014 0944   GFRNONAA 62* 02/03/2014 0944   GFRAA 72* 02/03/2014 0944   COAGS Lab Results  Component Value Date   INR 0.98 02/03/2014   INR 1.03 05/15/2012   Lipid Panel    Component Value Date/Time   CHOL 177 02/04/2014 0243   TRIG 94 02/04/2014 0243   HDL 45 02/04/2014 0243   CHOLHDL 3.9 02/04/2014 0243  VLDL 19 02/04/2014 0243   LDLCALC 113* 02/04/2014 0243   HgbA1C  Lab Results  Component Value Date   HGBA1C 8.1* 02/06/2014   Cardiac Panel (last 3 results) No results found for this basename: CKTOTAL, CKMB, TROPONINI, RELINDX,  in the last 72 hours Urinalysis No results found for this basename: colorurine, appearanceur, labspec, phurine, glucoseu, hgbur, bilirubinur, ketonesur, proteinur, urobilinogen, nitrite, leukocytesur   Urine Drug Screen     Component Value Date/Time   LABOPIA NONE DETECTED 05/15/2012 2245   COCAINSCRNUR NONE DETECTED 05/15/2012 2245   LABBENZ NONE DETECTED 05/15/2012 2245   AMPHETMU NONE DETECTED 05/15/2012 2245   THCU NONE DETECTED 05/15/2012 2245   LABBARB NONE DETECTED 05/15/2012 2245    Alcohol Level No results found for this basename: eth    SIGNIFICANT DIAGNOSTIC STUDIES CT of the brain 02/03/2014 Atrophy with extensive supratentorial small vessel disease as well as prior infarct in the left  pons. No acute appearing infarct seen. No hemorrhage or mass effect. Major intracranial vessels shows some increased attenuation, stable from prior study. The significance of this finding is questionable.  CT Angio Head and Neck 02/03/2014 1. 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 No arterial stenosis in the neck. No major circle of Willis branch occlusion. 2. Mild irregularity of the left PCA P1 segment. Consider left thalamostriate type small vessel ischemia in this setting. 3. Stable CT appearance of the brain. No evidence of cortically based acute infarction identified. Salient findings on this study reviewed in person with  MRI of the brain 02/04/2014 2 x 2.5 cm region of acute infarction affecting the left basal ganglia and radiating white matter tracts. Mild swelling but no hemorrhage or mass effect. Extensive chronic small vessel disease throughout the cerebral hemispheric white matter. 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  MRA of the brain 02/04/2014 Negative intracranial MR angiography of the large and medium size vessels.  2D Echocardiogram EF 55-60% with no source of embolus.  Carotid Doppler No evidence of hemodynamically significant internal carotid artery stenosis. Vertebral artery flow is antegrade.  CXR 02/03/2014 No interval change. Cardiomegaly and left lower lobe atelectasis.  EKG normal EKG, normal sinus rhythm, unchanged from previous tracings. For complete results please see formal report.      History of Present Illness  Dakota Hall is a 63 y.o. male with a history of two previous strokes who presents with sudden onset right hemiparesis at 8:40am on 02/03/2014. After arrival, the patient had severely elevated blood pressure requiring labetalol followed by nicardipine. He has hx of DM and HTN, states well controlled. On ASA 325 prior to admission. He was administered IV tPA. CTA showed no large vessel occulusion, so not an intervention candidate. He was admitted to the ICU  for further treatment and evaluation.  Patient with resultant right hemiparesis. Physical therapy, occupational therapy and speech therapy evaluated patient. All agreed inpatient rehab is needed. Patient's wife is/are supportive and can provide care at discharge. CIR bed is available today and patient will be transferred there.  Discharge Exam  Blood pressure 176/62, pulse 54, temperature 97.5 F (36.4 C), temperature source Oral, resp. rate 18, height 5\' 9"  (1.753 m), weight 114.6 kg (252 lb 10.4 oz), SpO2 97.00%.  General: in bed  CV: RRR  Mental Status:  Patient is awake, alert, oriented to person, place, month, year, and situation.  Immediate and remote memory are intact.  Patient is able to give a clear and coherent history.  No signs of aphasia or neglect  +dysarthria  Cranial Nerves:  II: Visual Fields are full. Pupils are equal, round, and reactive to light. Discs are difficult to visualize.  III,IV, VI: EOMI without ptosis or diploplia.  V: Facial sensation is symmetric to temperature  VII: Facial movement is notable for R facial droop  VIII: hearing is intact to voice  X: Uvula elevates symmetrically  XI: Shoulder shrug is symmetric.  XII: tongue is midline without atrophy or fasciculations.  Motor:  Tone is normal. Bulk is normal. 5/5 strength was present on the left, no movement right arm, able to lift leg off bed, but drifts down to bed.  Sensory:  Sensation is diminished to pin in right leg.  Deep Tendon Reflexes:  2+ and symmetric in the biceps and patellae.  Plantars:  Toes are upgoing bilaterally.  Cerebellar:  FNF and HKS are intact on the left  Gait:  Not assessed due to acute nature of evaluation and multiple medical monitors in ED setting.    Discharge Diet  Dysphagia thin liquids  Discharge Plan  Disposition:  [facility], OT and ST  clopidogrel 75 mg orally every day for secondary stroke  prevention.  Recommend ongoing risk factor control by Primary Care Physician at time of discharge from inpatient rehabilitation.  Follow-up [name],[name], MD in 1 month following discharge from rehab.  Follow-up with Dr. Delia HeadyPramod Sethi, Stroke Clinic in 2 months.  40 minutes were spent preparing discharge.  Signed Annie MainSharon Biby, MSN, RN, ANVP-BC, AGPCNP-BC [facility] Nurse Practitioner 02/08/2014, 2:34 PM  I have personally examined this patient, reviewed pertinent data and developed the plan of care. I agree with above.  Delia HeadyPramod Sethi, MD

## 2014-02-08 NOTE — Progress Notes (Signed)
Discharge orders received, pt for discharge to CIR, IV D/C and telemetry.  CVA  Educational video showed to/viewed by pt.  Staff brought pt to CIR via bed.

## 2014-02-08 NOTE — H&P (Signed)
Physical Medicine and Rehabilitation Admission H&P  Chief Complaint   Patient presents with   .  Right sided weakness with slurred speech.   Chief Complaint: Right sided weakness and difficulty speaking    HPI: Dakota Hall is a 62 y.o. right-handed male with history of CVA x2, DM type 2, OSA, who was admitted 02/03/2014 with acute onset right-sided weakness, right facial weakness with difficulty speaking and was noted to have severely elevated BP requiring labetalol and nicardipine. CCT negative for acute changes. CTA head/neck with mild irregularity of Left PCA question thalamostriate type ischemia, no large vessel stenosis. Patient treated with TPA and MRI of the head 2x2.5 cm acute infarct left basal ganglia radiating to white matter tracts. MRA of the head with no major occlusion or stenosis. Echocardiogram ejection fraction 60% without emboli. Carotid Dopplers with no ICA stenosis. 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 Swallow evaluation done and showed evidence of pharyngeal dysphagia with delay in swallow and was placed on dysphagia 3, thin liquids. Patient with resultant dense right hemiparesis, poor safety awareness and moderate dysarthria impacting respiratory, articulatory and respiratory processes.    Review of Systems  HENT: Negative for hearing loss.  Eyes: Negative for blurred vision and double vision.  Respiratory: Negative for cough and shortness of breath.  Cardiovascular: Negative for chest pain and palpitations.  Gastrointestinal: Negative for heartburn, nausea and abdominal pain.  Genitourinary: Negative for urgency and frequency.  Neurological: Positive for sensory change, speech change, focal weakness and weakness. Negative for headaches.  Psychiatric/Behavioral: Positive for depression (controlled--sees Dr. Jennelle Human). The patient is nervous/anxious.   Past Medical History   Diagnosis  Date   .  Diabetes  mellitus    .  Hypertension    .  Stroke  July 2013     left paramedian pontine, incidental right parietal subcortical. both d/t small vessel disease   .  Stroke  March 2015     left basal ganglia secondary to small vessel disease   .  Obesity    .  Depression with anxiety    .  OSA on CPAP     Past Surgical History   Procedure  Laterality  Date   .  Tonsillectomy     .  Anal fissure repair      Family History   Problem  Relation  Age of Onset   .  Cancer  Mother    .  Heart disease  Father     Social History: Married. Works in Community education officer. Independent without AD. He reports that he has never smoked. He has never used smokeless tobacco. He reports that he does not drink alcohol or use illicit drugs.  Allergies   Allergen  Reactions   .  Penicillins  Hives    Medications Prior to Admission   Medication  Sig  Dispense  Refill   .  ABILIFY 5 MG tablet  2.5 mg.     .  amLODipine (NORVASC) 10 MG tablet  Take 10 mg by mouth daily.     .  metFORMIN (GLUCOPHAGE-XR) 500 MG 24 hr tablet  Take 1,000 mg by mouth daily with breakfast.     .  nebivolol (BYSTOLIC) 5 MG tablet  Take 1 tablet (5 mg total) by mouth daily.  30 tablet  0   .  pioglitazone (ACTOS) 15 MG tablet  Take 15 mg by mouth daily.     .  sertraline (ZOLOFT)  100 MG tablet  Take 150 mg by mouth daily. Take 2 tabs (300) daily     .  traZODone (DESYREL) 50 MG tablet  Take 50-100 mg by mouth daily as needed. For sleep     .  valsartan-hydrochlorothiazide (DIOVAN-HCT) 160-12.5 MG per tablet  Take 1 tablet by mouth daily.     .  Vitamin D, Ergocalciferol, (DRISDOL) 50000 UNITS CAPS capsule  50,000 Units every 7 (seven) days.     .  [DISCONTINUED] aspirin 325 MG tablet  Take 325 mg by mouth daily.     .  simvastatin (ZOCOR) 20 MG tablet  Take 1 tablet (20 mg total) by mouth daily at 6 PM.  30 tablet  0    Home:  Home Living  Family/patient expects to be discharged to:: Private residence  Living Arrangements: Spouse/significant  other  Available Help at Discharge: Family;Available 24 hours/day  Type of Home: House  Home Access: Stairs to enter  [employer] of Steps: 4  Entrance Stairs-Rails: Left;Right;Can reach both  Home Layout: Two level;Bed/bath upstairs;1/2 bath on main level  Alternate Level Stairs-Number of Steps: flight  Alternate Level Stairs-Rails: Can reach both;Left;Right  Home Equipment: Shower seat - built in  Lives With: Spouse  Functional History:  Prior Function  Vocation: Retired  Comments: PTA pt very independent and active; still drove and Surveyor, quantity Status:  Mobility: mod assist for basic transfers and mobility     ADL:  ADL  Eating/Feeding: Minimal assistance (with L hand)  Where Assessed - Eating/Feeding: Bed level  Grooming: Wash/dry face;Teeth care;Minimal assistance  Where Assessed - Grooming: Supine, head of bed up  Upper Body Bathing: Maximal assistance  Where Assessed - Upper Body Bathing: Supine, head of bed up  Lower Body Bathing: +1 Total assistance  Where Assessed - Lower Body Bathing: Supine, head of bed up;Rolling right and/or left  Upper Body Dressing: Maximal assistance  Where Assessed - Upper Body Dressing: Supine, head of bed up  Lower Body Dressing: +1 Total assistance  Where Assessed - Lower Body Dressing: Supine, head of bed up;Rolling right and/or left  ADL Comments: Began instruction in hemi techniques for ADL. Instructed pt and wife in protection of R UE during bed mobility, positioning, and edema management.  Cognition:  Cognition  Overall Cognitive Status: Impaired/Different from baseline  Arousal/Alertness: Awake/alert  Orientation Level: Oriented X4  Attention: Sustained  Sustained Attention: Appears intact  Memory: (TBA)  Awareness: Appears intact  Problem Solving: (TBA further)  Safety/Judgment: Appears intact  Cognition  Arousal/Alertness: Awake/alert  Behavior During Therapy: Impulsive  Overall Cognitive Status:  Impaired/Different from baseline  Area of Impairment: Safety/judgement  Safety/Judgement: Decreased awareness of safety  General Comments: pt slightly impulsive for transfer. req verbal and tactile cueing for safety    Physical Exam:  Blood pressure 176/62, pulse 54, temperature 97.5 F (36.4 C), temperature source Oral, resp. rate 18, height 5\' 9"  (1.753 m), weight 51.12 kg (112 lb 11.2 oz), SpO2 97.00%.  Physical Exam  Constitutional: He is oriented to person, place, and time. He appears well-developed.  Eyes: EOM are normal.  Neck: Normal range of motion. Neck supple. No thyromegaly present.  Cardiovascular: Normal rate and regular rhythm.  Respiratory: Effort normal and breath sounds normal. No respiratory distress.  GI: Soft. Bowel sounds are normal. He exhibits no distension.  Neurological: He is alert and oriented to person, place, and time.  Follows commands. Alert. Has reasonable insight and awareness. Processing and word finding a  little delayed. Mild right central 7. No sensory deficits. RUE tr to1 at deltoid, tricep, otherwise 0/5. RLE H, HAD, KE are tr to 1;  ADF/APF are absent.  LUE limited due to rotator cuff but otherwise 3+ to 4/5. LLE is 3 to 3+ /5.  Follows commands. DTR's 3+ on right. Resting tone on right of 1+ to 2/4 at pec/bicep,gastroc, hamstrings  Skin: Skin is warm and dry.  Psychiatric: he is cooperative. A little flat    Results for orders placed during the hospital encounter of 02/03/14 (from the past 48 hour(s))   GLUCOSE, CAPILLARY Status: Abnormal    Collection Time    02/06/14 12:57 PM   Result  Value  Ref Range    Glucose-Capillary  139 (*)  70 - 99 mg/dL   GLUCOSE, CAPILLARY Status: Abnormal    Collection Time    02/06/14 4:51 PM   Result  Value  Ref Range    Glucose-Capillary  122 (*)  70 - 99 mg/dL   HEMOGLOBIN Z6X Status: Abnormal    Collection Time    02/06/14 9:03 PM   Result  Value  Ref Range    Hemoglobin A1C  8.1 (*)  <5.7 %    Comment:   (NOTE)         According to the ADA Clinical Practice Recommendations for 2011, when     HbA1c is used as a screening test:     >=6.5% Diagnostic of Diabetes Mellitus     (if abnormal result is confirmed)     5.7-6.4% Increased risk of developing Diabetes Mellitus     References:Diagnosis and Classification of Diabetes Mellitus,Diabetes     Care,2011,34(Suppl 1):S62-S69 and Standards of Medical Care in     Diabetes - 2011,Diabetes Care,2011,34 (Suppl 1):S11-S61.    Mean Plasma Glucose  186 (*)  <117 mg/dL    Comment:  Performed at [facility]   GLUCOSE, CAPILLARY Status: Abnormal    Collection Time    02/06/14 9:43 PM   Result  Value  Ref Range    Glucose-Capillary  165 (*)  70 - 99 mg/dL    Comment 1  Documented in Chart     Comment 2  Notify RN    GLUCOSE, CAPILLARY Status: Abnormal    Collection Time    02/07/14 7:33 AM   Result  Value  Ref Range    Glucose-Capillary  152 (*)  70 - 99 mg/dL   GLUCOSE, CAPILLARY Status: Abnormal    Collection Time    02/07/14 12:13 PM   Result  Value  Ref Range    Glucose-Capillary  150 (*)  70 - 99 mg/dL   GLUCOSE, CAPILLARY Status: Abnormal    Collection Time    02/07/14 4:59 PM   Result  Value  Ref Range    Glucose-Capillary  128 (*)  70 - 99 mg/dL    Comment 1  Notify RN     Comment 2  Documented in Chart    GLUCOSE, CAPILLARY Status: Abnormal    Collection Time    02/07/14 10:56 PM   Result  Value  Ref Range    Glucose-Capillary  132 (*)  70 - 99 mg/dL    Comment 1  Notify RN     Comment 2  Documented in Chart    GLUCOSE, CAPILLARY Status: Abnormal    Collection Time    02/08/14 6:57 AM   Result  Value  Ref Range    Glucose-Capillary  127 (*)  70 - 99 mg/dL    Comment 1  Notify RN     Comment 2  Documented in Chart     No results found.  Post Admission Physician Evaluation:  1. Functional deficits secondary to left basal ganglia infarct. 2. Patient is admitted to receive collaborative, interdisciplinary care  between the physiatrist, rehab nursing staff, and therapy team. 3. Patient's level of medical complexity and substantial therapy needs in context of that medical necessity cannot be provided at a lesser intensity of care such as a SNF. 4. Patient has experienced substantial functional loss from his/her baseline which was documented above under the "Functional History" and "Functional Status" headings. Judging by the patient's diagnosis, physical exam, and functional history, the patient has potential for functional progress which will result in measurable gains while on inpatient rehab. These gains will be of substantial and practical use upon discharge in facilitating mobility and self-care at the household level. 5. Physiatrist will provide 24 hour management of medical needs as well as oversight of the therapy plan/treatment and provide guidance as appropriate regarding the interaction of the two. 6. 24 hour rehab nursing will assist with bladder management, bowel management, safety, skin/wound care, disease management, medication administration, pain management and patient education and help integrate therapy concepts, techniques,education, etc. 7. PT will assess and treat for/with: Lower extremity strength, range of motion, stamina, balance, functional mobility, safety, adaptive techniques and equipment, NMR, spasticity mgt, stroke education. Goals are: supervision . 8. OT will assess and treat for/with: ADL's, functional mobility, safety, upper extremity strength, adaptive techniques and equipment, NMR, spasticity mgt, stroke education, orthotics. Goals are: supervision to min assist. 9. SLP will assess and treat for/with: speech intelligibility, cognition, processing. Goals are: mod I. 10. Case Management and Social Worker will assess and treat for psychological issues and discharge planning. 11. Team conference will be held weekly to assess progress toward goals and to determine barriers to  discharge. 12. Patient will receive at least 3 hours of therapy per day at least 5 days per week. 13. ELOS: 20-24 days  14. Prognosis: excellent   Medical Problem List and Plan:  1. 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  2. DVT Prophylaxis/Anticoagulation: Subcutaneous Lovenox. Monitor platelet counts any signs of bleeding  3. Pain Management: Tylenol as needed  4. H/o depression with anxiety/Mood: Stable at this time. LCSW to follow for evaluation. Continue Abilify 2.5 mg daily and Zoloft 150 mg daily. Will use ativan 0.5 mg tid prn for anxiety.   5. Neuropsych: This patient is capable of making decisions on his own behalf.  6. Diabetes mellitus: Hemoglobin A1c 8.1. Check blood sugars a.c. and at bedtime.  7. Hypertension: Will monitor BP with bid checks--continue Norvasc 10 mg daily, Bystolic 5mg  daily, Diovan 160 mg daily, hydrochlorothiazide 12.5 mg daily. Monitor with increased mobility  8. Hyperlipidemia: On Zocor  9. Constipation: Will schedule Miralax for bowel program.  10. OSA: continue CPAP to help with sleep hygiene and energy levels.  11. Spasticity: Add low dose baclofen. Splinting may be required as well.   Ranelle OysterZachary T. Swartz, MD, [facility] HospitalFAAPMR [facility]   3/25/201

## 2014-02-08 NOTE — Progress Notes (Signed)
Speech Language Pathology Treatment: Cognitive-Linquistic  Patient Details Name: Dakota Hall MRN: [id number] DOB: 11/17/1951 Today's Date: 02/08/2014 Time: 1002-1015 SLP Time Calculation (min): 13 min  Assessment / Plan / Recommendation Clinical Impression  Treatment focused on skilled facilitation of dysarthria following CVA.  Introduced compensatory speech strategies with distortions on fricative phonemes and hypernasal quality of speech.  Pt. Able to recall strategies with min-mod verbal prompts after 3-5 minute delay, imitated SLP using techniques in sentences.  He required moderate verbal/visual cues at conversational level.  Plans are for transfer to CIR per Pamelia HoitJeanie Logue where treatment will continue for speech intelligibility and executive functioning. (ST was seeing pt. For dysphagia on Dys 3 diet , thin liquids, however MD canceled swallow orders).    HPI HPI: 63 y.o. male with a history of HTN, DM, two previous strokes who presents with sudden onset right hemiparesis at 8:40am. CXR No interval change. Cardiomegaly and left lower lobe atelectasis.  CT small vessel disease as well as prior infarct in the left pons. No acute appearing infarct seen.   Pertinent Vitals WDL  SLP Plan  Continue with current plan of care    Recommendations  CIR              Oral Care Recommendations: Oral care BID Follow up Recommendations: Inpatient Rehab Plan: Continue with current plan of care    GO     Royce MacadamiaLisa Willis Silvie Obremski M.Ed [employer]-SLP Pager [phone]  02/08/2014

## 2014-02-08 NOTE — Progress Notes (Signed)
Talked to [place]Jeanie with Inpatient rehab- [facility] today[facility]is Goodell; B Wandy Bossler RN,BSN,MHA [phone]

## 2014-02-08 NOTE — Progress Notes (Signed)
Patient admitted on inpatient rehab today with nurse and nurse tech at bedside with belongings. Patient given rehab information packet, oriented to room and call bell. All questions answered.

## 2014-02-08 NOTE — Progress Notes (Signed)
Physical Therapy Treatment Patient Details Name: Dakota AnoRoger W Gehling MRN: [id number] DOB: 11/19/1950 Today's Date: 02/08/2014    History of Present Illness [name] is a 63 y.o.  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 CTA shows no large vessel occlusion and he is being admitted to the ICU for post TPA care given around 0900.     PT Comments    Pt demos improved mobility in R LE during functional tasks.  Pt follows all cues and attempts everything PT asks.  Pt's R knee hyperextends in stance when pt is attempting to maintain knee extension causing pt to flex at his hip.  Pt will continue to benefit from CIR at D/C.  Will continue to follow.    Follow Up Recommendations  CIR     Equipment Recommendations   (TBD)    Recommendations for Other Services       Precautions / Restrictions Precautions Precautions: Fall Precaution Comments: Rt hemiparesis  Restrictions Weight Bearing Restrictions: No    Mobility  Bed Mobility Overal bed mobility: Needs Assistance;+2 for physical assistance Bed Mobility: Supine to Sit     Supine to sit: Mod assist;+2 for physical assistance     General bed mobility comments: pt able to A with bringing R LE towards EOB today.  pt utilizes L UE to A in bringing his trunk up to sitting.    Transfers Overall transfer level: Needs assistance Equipment used: 2 person hand held assist Transfers: Sit to/from [employer]Stand;Stand Pivot Transfers Sit to Stand: Mod assist;+2 physical assistance Stand pivot transfers: Mod assist;+2 physical assistance       General transfer comment: pt needs cues for L UE use and A with positioning of Bil LEs.  pt needs A to bring R LE around pivot to chair and A wt shifting to R side and blocking R knee to allow movement of L LE.    Ambulation/Gait                 Stairs            Wheelchair Mobility    Modified Rankin (Stroke Patients Only) Modified Rankin (Stroke Patients Only) Pre-Morbid Rankin Score: No  symptoms Modified Rankin: Severe disability     Balance Overall balance assessment: Needs assistance Sitting-balance support: Single extremity supported;Feet supported Sitting balance-Leahy Scale: Fair     Standing balance support: Single extremity supported;During functional activity Standing balance-Leahy Scale: Zero Standing balance comment: Worked on standing balance and activity tolerance.  pt able to extend R LE, though often hyperextends in stance to maintain knee extension and flexes hip.  Needs facilitation for hip in neutral position and blocking knee while wt shifting and working on unweighting L LE.                      Cognition Arousal/Alertness: Awake/alert Behavior During Therapy: WFL for tasks assessed/performed Overall Cognitive Status: Impaired/Different from baseline Area of Impairment: Safety/judgement         Safety/Judgement: Decreased awareness of deficits;Decreased awareness of safety          Exercises      General Comments        Pertinent Vitals/Pain Denied pain.      Home Living                      Prior Function            PT Goals (current goals can now be found  in the care plan section) Acute Rehab PT Goals Patient Stated Goal: to get home Time For Goal Achievement: 02/19/14 Potential to Achieve Goals: Good Progress towards PT goals: Progressing toward goals    Frequency  Min 4X/week    PT Plan Current plan remains appropriate    End of Session Equipment Utilized During Treatment: Gait belt Activity Tolerance: Patient tolerated treatment well Patient left: in chair;with call bell/phone within reach     Time: 1125-1149 PT Time Calculation (min): 24 min  Charges:  $Therapeutic Activity: 23-37 mins                    G CodesSunny Schlein, [place] [phone] 02/08/2014, 2:28 PM

## 2014-02-09 ENCOUNTER — Inpatient Hospital Stay (HOSPITAL_COMMUNITY): Payer: Managed Care, Other (non HMO)

## 2014-02-09 ENCOUNTER — Inpatient Hospital Stay (HOSPITAL_COMMUNITY): Payer: Managed Care, Other (non HMO) | Admitting: Occupational Therapy

## 2014-02-09 DIAGNOSIS — E119 Type 2 diabetes mellitus without complications: Secondary | ICD-10-CM

## 2014-02-09 DIAGNOSIS — I69959 Hemiplegia and hemiparesis following unspecified cerebrovascular disease affecting unspecified side: Secondary | ICD-10-CM

## 2014-02-09 DIAGNOSIS — I69922 Dysarthria following unspecified cerebrovascular disease: Secondary | ICD-10-CM

## 2014-02-09 DIAGNOSIS — I69991 Dysphagia following unspecified cerebrovascular disease: Secondary | ICD-10-CM

## 2014-02-09 DIAGNOSIS — R1313 Dysphagia, pharyngeal phase: Secondary | ICD-10-CM

## 2014-02-09 DIAGNOSIS — I6330 Cerebral infarction due to thrombosis of unspecified cerebral artery: Secondary | ICD-10-CM

## 2014-02-09 LAB — COMPREHENSIVE METABOLIC PANEL
ALBUMIN: 3.5 g/dL (ref 3.5–5.2)
ALT: 44 U/L (ref 0–53)
AST: 35 U/L (ref 0–37)
Alkaline Phosphatase: 56 U/L (ref 39–117)
BUN: 28 mg/dL — AB (ref 6–23)
CALCIUM: 9.6 mg/dL (ref 8.4–10.5)
CO2: 24 mEq/L (ref 19–32)
CREATININE: 1.17 mg/dL (ref 0.50–1.35)
Chloride: 105 mEq/L (ref 96–112)
GFR calc Af Amer: 75 mL/min — ABNORMAL LOW (ref >90)
GFR, EST NON AFRICAN AMERICAN: 65 mL/min — AB (ref >90)
Glucose, Bld: 140 mg/dL — ABNORMAL HIGH (ref 70–99)
Potassium: 3.7 mEq/L (ref 3.7–5.3)
Sodium: 144 mEq/L (ref 137–147)
Total Bilirubin: 0.6 mg/dL (ref 0.3–1.2)
Total Protein: 7.2 g/dL (ref 6.0–8.3)

## 2014-02-09 LAB — CBC WITH DIFFERENTIAL/PLATELET
BASOS ABS: 0 10*3/uL (ref 0.0–0.1)
Basophils Relative: 0 % (ref 0–1)
Eosinophils Absolute: 0.2 10*3/uL (ref 0.0–0.7)
Eosinophils Relative: 2 % (ref 0–5)
HEMATOCRIT: 43.6 % (ref 39.0–52.0)
Hemoglobin: 16 g/dL (ref 13.0–17.0)
LYMPHS ABS: 3.1 10*3/uL (ref 0.7–4.0)
Lymphocytes Relative: 36 % (ref 12–46)
MCH: 31.9 pg (ref 26.0–34.0)
MCHC: 36.7 g/dL — AB (ref 30.0–36.0)
MCV: 87 fL (ref 78.0–100.0)
MONO ABS: 0.6 10*3/uL (ref 0.1–1.0)
MONOS PCT: 7 % (ref 3–12)
Neutro Abs: 4.7 10*3/uL (ref 1.7–7.7)
Neutrophils Relative %: 55 % (ref 43–77)
PLATELETS: 194 10*3/uL (ref 150–400)
RBC: 5.01 MIL/uL (ref 4.22–5.81)
RDW: 13 % (ref 11.5–15.5)
WBC: 8.6 10*3/uL (ref 4.0–10.5)

## 2014-02-09 LAB — GLUCOSE, CAPILLARY
GLUCOSE-CAPILLARY: 140 mg/dL — AB (ref 70–99)
GLUCOSE-CAPILLARY: 147 mg/dL — AB (ref 70–99)
Glucose-Capillary: 127 mg/dL — ABNORMAL HIGH (ref 70–99)
Glucose-Capillary: 146 mg/dL — ABNORMAL HIGH (ref 70–99)

## 2014-02-09 MED ORDER — NEBIVOLOL HCL 2.5 MG PO TABS
2.5000 mg | ORAL_TABLET | Freq: Every day | ORAL | Status: DC
  Administered 2014-02-09: 2.5 mg via ORAL
  Filled 2014-02-09 (×4): qty 1

## 2014-02-09 NOTE — Care Management Note (Signed)
Inpatient Rehabilitation Center Individual Statement of Services  Patient Name:  Dakota Hall  Date:  02/09/2014  Welcome to the Inpatient Rehabilitation Center.  Our goal is to provide you with an individualized program based on your diagnosis and situation, designed to meet your specific needs.  With this comprehensive rehabilitation program, you will be expected to participate in at least 3 hours of rehabilitation therapies Monday-Friday, with modified therapy programming on the weekends.  Your rehabilitation program will include the following services:  Physical Therapy (PT), Occupational Therapy (OT), Speech Therapy (ST), 24 hour per day rehabilitation nursing, Therapeutic Recreaction (TR), Neuropsychology, Case Management (Social Worker), Rehabilitation Medicine, Nutrition Services and Pharmacy Services  Weekly team conferences will be held on Wednesday to discuss your progress.  Your Social Worker will talk with you frequently to get your input and to update you on team discussions.  Team conferences with you and your family in attendance may also be held.  Expected length of stay: 25-27 days Overall anticipated outcome: supervision with set-up  Depending on your progress and recovery, your program may change. Your Social Worker will coordinate services and will keep you informed of any changes. Your Social Worker's name and contact numbers are listed  below.  The following services may also be recommended but are not provided by the Inpatient Rehabilitation Center:   Driving Evaluations  Home Health Rehabiltiation Services  Outpatient Rehabilitation Services  Vocational Rehabilitation   Arrangements will be made to provide these services after discharge if needed.  Arrangements include referral to agencies that provide these services.  Your insurance has been verified to be:  [employer] Your primary doctor is:  Dr Theressa MillardJames Osborne  Pertinent information will be shared with your doctor  and your insurance company.  Social Worker:  Dossie DerBecky Tay [place], SW [phone] or [phone]) [phone]  Information discussed with and copy given to patient by: Dakota Hall, Dakota Hall, 02/09/2014, 12:09 PM

## 2014-02-09 NOTE — Evaluation (Signed)
Occupational Therapy Assessment and Plan  Patient Details  Name: Dakota Hall MRN: [id number] Date of Birth: 08-30-1951  OT Diagnosis: hemiplegia affecting dominant side and muscle weakness (generalized) Rehab Potential: Rehab Potential: Good ELOS: 25-27 days   Today's Date: 02/09/2014 Time: 0805-0905 Time Calculation (min): 60 min  Problem List:  Patient Active Problem List   Diagnosis Date Noted  . CVA (cerebral infarction) 02/08/2014  . Depression with anxiety   . 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 Obesity   . Stroke 02/03/2014  . Unspecified cerebral artery occlusion with cerebral infarction 07/20/2013  . Stroke, small vessel 05/18/2012  . Slurred speech 05/15/2012  . HTN (hypertension) 05/15/2012  . Noncompliance with medication regimen 05/15/2012  . DM (diabetes mellitus) 05/15/2012  . Vision changes 05/15/2012    Past Medical History:  Past Medical History  Diagnosis Date  . Diabetes mellitus   . Hypertension   . Stroke July 2013    left paramedian pontine, incidental right parietal subcortical. both d/t small vessel disease  . Stroke March 2015    left basal ganglia secondary to small vessel disease  . Obesity   . Depression with anxiety   . OSA on CPAP    Past Surgical History:  Past Surgical History  Procedure Laterality Date  . Tonsillectomy    . Anal fissure repair      Assessment & Plan Clinical Impression: Dakota Hall is a 63 y.o. right-handed male with history of CVA x2, DM type 2, OSA, who was admitted 02/03/2014 with acute onset right-sided weakness, right facial weakness with difficulty speaking and was noted to have severely elevated BP requiring labetalol and nicardipine. CCT negative for acute changes. CTA head/neck with mild irregularity of Left PCA question thalamostriate type ischemia, no large vessel stenosis. Patient treated with TPA and MRI of the head 2x2.5 cm acute infarct left basal ganglia radiating to white matter tracts. MRA of  the head with no major occlusion or stenosis. Echocardiogram ejection fraction 60% without emboli. Carotid Dopplers with no ICA stenosis. 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 Swallow evaluation done and showed evidence of pharyngeal dysphagia with delay in swallow and was placed on dysphagia 3, thin liquids. Patient with resultant dense right hemiparesis, poor safety awareness and moderate dysarthria impacting respiratory, articulatory and respiratory processes.  Patient transferred to CIR on 02/08/2014 .    Patient currently requires max with basic self-care skills secondary to muscle weakness, decreased cardiorespiratoy endurance, abnormal tone and decreased sitting balance, decreased standing balance, decreased postural control and hemiplegia.  Prior to hospitalization, patient was fully independent, driving, and working full time.  Patient will benefit from skilled intervention to increase independence with basic self-care skills prior to discharge home with care partner.  Anticipate patient will require intermittent supervision and follow up home health.  OT - End of Session Activity Tolerance: Tolerates 10 - 20 min activity with multiple rests Endurance Deficit: Yes Endurance Deficit Description: SOBOE- rolling OT Assessment Rehab Potential: Good Barriers to Discharge: Inaccessible home environment Barriers to Discharge Comments: Bedroom and full bath on 2nd floor OT Patient demonstrates impairments in the following area(s): Balance;Endurance;Motor OT Basic ADL's Functional Problem(s): Grooming;Bathing;Dressing;Toileting OT Transfers Functional Problem(s): Toilet OT Additional Impairment(s): Fuctional Use of Upper Extremity OT Plan OT Intensity: Minimum of 1-2 x/day, 45 to 90 minutes OT Frequency: 5 out of 7 days OT Duration/Estimated Length of Stay: 25-27 days OT Treatment/Interventions: Balance/vestibular training;Discharge  planning;DME/adaptive equipment instruction;Functional mobility training;Functional  electrical stimulation;Neuromuscular re-education;Patient/family education;Psychosocial support;Self Care/advanced ADL retraining;Therapeutic Activities;Therapeutic Exercise;UE/LE Strength taining/ROM;UE/LE Coordination activities OT Self Feeding Anticipated Outcome(s): I OT Basic Self-Care Anticipated Outcome(s): supervision OT Toileting Anticipated Outcome(s): supervision OT Bathroom Transfers Anticipated Outcome(s): supervision to toilet (walk in shower to be determined if pt can climb stairs) OT Recommendation Patient destination: Home Follow Up Recommendations: Home health OT Equipment Recommended: 3 in 1 bedside comode   Skilled Therapeutic Intervention Pt seen for initial evaluation and ADL retraining from w/c level.  Pt transferred to w/c to his R side using scoot or squat pivot, but with max to total A x1.  Recommending pt always have 2 person assist for safety. His R side has increased tone with activity which makes his mobility very challenging. Pt did follow directions well and did not have any apparent deficits with cognition, perception, sensation.  Pt stood to sink with min A with 2 care givers for safety. This clinician supported him in standing with pt using L hand on sink.  Pt needed max A in standing. Provided pt with half lap tray.  Pt resting in w/c with call light in reach.  OT Evaluation Precautions/Restrictions  Precautions Precautions: Fall Precaution Comments: Rt hemiparesis  Restrictions Weight Bearing Restrictions: No General Chart Reviewed: Yes Vital Signs Therapy Vitals Pulse Rate: 48 BP: 151/81 mmHg Patient Position, if appropriate: Sitting Pain Pain Assessment Pain Assessment: No/denies pain Home Living/Prior Functioning Home Living Available Help at Discharge: Family;Available 24 hours/day Type of Home: House Home Access: Stairs to enter [employer] of  Steps: 4 (front door, not usually used) Entrance Stairs-Rails: Can reach both (front door has rails) Home Layout: Two level;Bed/bath upstairs;1/2 bath on main level Alternate Level Stairs-Number of Steps: flight Alternate Level Stairs-Rails: Can reach both;Left;Right  Lives With: Spouse Prior Function Level of Independence: Independent with basic ADLs;Independent with homemaking with ambulation;Independent with transfers;Independent with gait  Able to Take Stairs?: Yes Driving: Yes Vocation: Full time employment Comments: PTA pt very independent and active; still drove and mowed lawn  ADL ADL ADL Comments: Refer to FIM Vision/Perception  Vision - Assessment Eye Alignment: Within Functional Limits Ocular Range of Motion: Within Functional Limits Alignment/Gaze Preference: Within Defined Limits Tracking/Visual Pursuits: Able to track stimulus in all quads without difficulty Perception Comments: No perceptual deficits observed  Cognition Overall Cognitive Status: Within Functional Limits for tasks assessed Arousal/Alertness: Awake/alert Orientation Level: Oriented X4 Sensation Sensation Light Touch: Appears Intact Stereognosis: Not tested Hot/Cold: Appears Intact Proprioception: Appears Intact Coordination Gross Motor Movements are Fluid and Coordinated: No Fine Motor Movements are Fluid and Coordinated: No Finger Nose Finger Test: unable to with RUE due to hemiparesis Heel Shin Test: intact LLE; unable RLE Motor  Motor Motor: Hemiplegia;Abnormal tone;Primitive reflexes present Motor - Skilled Clinical Observations: associated reactions RUE and RLE with body movements and yawning Mobility    +2 for squat pivot transfers Trunk/Postural Assessment  Cervical Assessment Cervical Assessment: Within Functional Limits Thoracic Assessment Thoracic Assessment: Within Functional Limits Lumbar Assessment Lumbar Assessment: Within Functional Limits Postural Control Postural  Control: Within Functional Limits  Balance Balance Balance Assessed: Yes Static Sitting Balance Static Sitting - Balance Support: Feet supported;Left upper extremity supported (stand by assistance) Dynamic Sitting Balance Dynamic Sitting - Level of Assistance: 4: Min assist Static Standing Balance Static Standing - Level of Assistance: 1: +2 Total assist Extremity/Trunk Assessment RUE Assessment RUE Assessment: Exceptions to [facility] RUE Tone RUE Tone: Modified [place];Mild Modified Ashworth Scale for Grading Hypertonia RUE: Slight increase in muscle tone, manifested by  a catch and release or by minimal resistance at the end of the range of motion when the affected part(s) is moved in flexion or extension LUE Assessment LUE Assessment: Within Functional Limits  FIM:  FIM - Grooming Grooming Steps: Wash, rinse, dry face;Oral care, brush teeth, clean dentures Grooming: 3: Patient completes 2 of 4 or 3 of 5 steps FIM - Bathing Bathing Steps Patient Completed: Chest;Right Arm;Abdomen Bathing: 2: Max-Patient completes 3-4 [id number] parts or 25-49% FIM - Upper Body Dressing/Undressing Upper body dressing/undressing steps patient completed: Put head through opening of pull over shirt/dress Upper body dressing/undressing: 2: Max-Patient completed 25-49% of tasks FIM - Lower Body Dressing/Undressing Lower body dressing/undressing steps patient completed: Thread/unthread left underwear leg;Thread/unthread left pants leg Lower body dressing/undressing: 2: Max-Patient completed 25-49% of tasks FIM - Bed/Chair Transfer Bed/Chair Transfer: 3: Supine > Sit: Mod A (lifting assist/Pt. 50-74%/lift 2 legs;1: Two helpers FIM - TAir cabin crewTransfers: 0-Activity did not occur FIM - TCamera operatorTransfers: 0-Activity did not occur or was simulated   Refer to Care Plan for Long Term Goals  Recommendations for other services: None  Discharge Criteria: Patient will be  discharged from OT if patient refuses treatment 3 consecutive times without medical reason, if treatment goals not met, if there is a change in medical status, if patient makes no progress towards goals or if patient is discharged from hospital.  The above assessment, treatment plan, treatment alternatives and goals were discussed and mutually agreed upon: by patient  STurpin3/26/2015, 12:10 PM

## 2014-02-09 NOTE — IPOC Note (Addendum)
Overall Plan of Care [facility](IPOC) Patient Details Name: Dionne AnoRoger W Olinde MRN: [id number] DOB: 01/08/1951  Admitting Diagnosis: L CVA  Hospital Problems: Active Problems:   CVA (cerebral infarction)     Functional Problem List: Nursing Bladder;Bowel;Medication Management;Pain;Safety;Skin Integrity  PT Balance;Endurance;Motor  OT Balance;Endurance;Motor  SLP Cognition  TR  Activity tolerance, balance, motor, pain       Basic ADL's: OT Grooming;Bathing;Dressing;Toileting     Advanced  ADL's: OT       Transfers: PT Bed Mobility;Bed to Chair;Car;Furniture  OT Toilet     Locomotion: PT Ambulation;Wheelchair Mobility;Stairs     Additional Impairments: OT Fuctional Use of Upper Extremity  SLP Swallowing;Communication;Social Cognition expression Problem Solving;Memory  TR      Anticipated Outcomes Item Anticipated Outcome  Self Feeding I  Swallowing  Mod I with least restrictive PO   Basic self-care  supervision  Toileting  supervision   Bathroom Transfers supervision to toilet (walk in shower to be determined if pt can climb stairs)  Bowel/Bladder  Continent of bowel and bladder with minimal assistance  Transfers  S for basic; min assist for car  Locomotion  modified Independent w/c x 150'; supervision gait x 150' and up/down 5 steps  Communication  Mod I  Cognition  Mod I  Pain  Pain level 3 or less  Safety/Judgment  Supervision   Therapy Plan: PT Intensity: Minimum of 1-2 x/day ,45 to 90 minutes PT Frequency: 5 out of 7 days PT Duration Estimated Length of Stay: 25-27 days OT Intensity: Minimum of 1-2 x/day, 45 to 90 minutes OT Frequency: 5 out of 7 days OT Duration/Estimated Length of Stay: 25-27 days SLP Intensity: Minumum of 1-2 x/day, 30 to 90 minutes SLP Frequency: 5 out of 7 days SLP Duration/Estimated Length of Stay: 25-28 days       Team Interventions: Nursing Interventions Patient/Family Education;Bladder Management;Bowel Management;Disease  Management/Prevention;Medication Management;Pain Management;Skin Care/Wound Management  PT interventions Ambulation/gait training;Balance/vestibular training;Community reintegration;Discharge planning;Neuromuscular re-education;Functional mobility training;DME/adaptive equipment instruction;Patient/family education;Stair training;Therapeutic Activities;Therapeutic Exercise;UE/LE Strength taining/ROM;UE/LE Coordination activities;Wheelchair propulsion/positioning  OT Interventions Financial controllerBalance/vestibular training;Discharge planning;DME/adaptive equipment instruction;Functional mobility training;Functional electrical stimulation;Neuromuscular re-education;Patient/family education;Psychosocial support;Self Care/advanced ADL retraining;Therapeutic Activities;Therapeutic Exercise;UE/LE Strength taining/ROM;UE/LE Coordination activities  SLP Interventions Cognitive remediation/compensation;Cueing hierarchy;Dysphagia/aspiration precaution training;Environmental controls;Functional tasks;Internal/external aids;Medication managment;Oral motor exercises;Patient/family education;Speech/Language facilitation;Therapeutic Activities  TR Interventions  Activity tolerance, balance/coordination, UE/LE strength/coordination, adaptive equipment use/instruction, functional mobility, community reintegration, discharge planning, psychosocial support, therapeutic activities, pt/family education, animal assisted therapy  SW/CM Interventions Psychosocial Support;Patient/Family Education    Team Discharge Planning: Destination: PT-Home ,OT- Home , SLP-Home Projected Follow-up: PT-24 hour supervision/assistance;Home health PT;Outpatient PT, OT-  Home health OT, SLP-Home Health SLP;Outpatient SLP Projected Equipment Needs: PT-To be determined, OT- 3 in 1 bedside comode, SLP-None recommended by SLP Equipment Details: PT- , OT-  Patient/family involved in discharge planning: PT- Patient,  OT-Patient, SLP-Patient  MD ELOS:  25-28d Medical Rehab Prognosis:  Fair Assessment: 63 y.o. right-handed male with history of CVA x2, DM type 2, OSA, who was admitted 02/03/2014 with acute onset right-sided weakness, right facial weakness with difficulty speaking and was noted to have severely elevated BP requiring labetalol and nicardipine. CCT negative for acute changes. CTA head/neck with mild irregularity of Left PCA question thalamostriate type ischemia, no large vessel stenosis. Patient treated with TPA and MRI of the head 2x2.5 cm acute infarct left basal ganglia radiating to white matter tracts. MRA of the head with no major occlusion or stenosis. Echocardiogram ejection fraction 60% without emboli. Carotid Dopplers with no ICA stenosis. 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 Swallow evaluation done and showed evidence of pharyngeal dysphagia with delay in swallow and was placed on dysphagia 3, thin liquids  Now requiring 24/7 Rehab RN,MD, as well as CIR level PT, OT and SLP.  Treatment team will focus on ADLs and mobility with goals set at Sup   See Team Conference Notes for weekly updates to the plan of care

## 2014-02-09 NOTE — Progress Notes (Signed)
Patient information reviewed and entered into eRehab system by Hoang Reich, RN, CRRN, PPS Coordinator.  Information including medical coding and functional independence measure will be reviewed and updated through discharge.    

## 2014-02-09 NOTE — Evaluation (Addendum)
Physical Therapy Assessment and Plan  Patient Details  Name: Dakota Hall MRN: [id number] Date of Birth: 03/21/1951  PT Diagnosis: Difficulty walking, Hemiparesis dominant, Hypertonia and Muscle weakness Rehab Potential: Good ELOS: 25-27 days   Today's Date: 02/09/2014 Time:1005-1100,  1305-1330 Time Calculation (min): 55 min, 25 min  Problem List:  Patient Active Problem List   Diagnosis Date Noted  . CVA (cerebral infarction) 02/08/2014  . Depression with anxiety   . 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 Obesity   . Stroke 02/03/2014  . Unspecified cerebral artery occlusion with cerebral infarction 07/20/2013  . Stroke, small vessel 05/18/2012  . Slurred speech 05/15/2012  . HTN (hypertension) 05/15/2012  . Noncompliance with medication regimen 05/15/2012  . DM (diabetes mellitus) 05/15/2012  . Vision changes 05/15/2012    Past Medical History:  Past Medical History  Diagnosis Date  . Diabetes mellitus   . Hypertension   . Stroke July 2013    left paramedian pontine, incidental right parietal subcortical. both d/t small vessel disease  . Stroke March 2015    left basal ganglia secondary to small vessel disease  . Obesity   . Depression with anxiety   . OSA on CPAP    Past Surgical History:  Past Surgical History  Procedure Laterality Date  . Tonsillectomy    . Anal fissure repair      Assessment & Plan Clinical Impression:   Dakota Hall is a 63 y.o. right-handed male with history of CVA x2, DM type 2, OSA, who was admitted 02/03/2014 with acute onset right-sided weakness, right facial weakness with difficulty speaking and was noted to have severely elevated BP requiring labetalol and nicardipine. CCT negative for acute changes. CTA head/neck with mild irregularity of Left PCA question thalamostriate type ischemia, no large vessel stenosis. Patient treated with TPA and MRI of the head 2x2.5 cm acute infarct left basal ganglia radiating to white matter tracts. MRA  of the head with no major occlusion or stenosis. Echocardiogram ejection fraction 60% without emboli. Patient transferred to CIR on 02/08/2014 .   Patient currently requires total assist of 2 people with mobility secondary to decreased cardiorespiratoy endurance and impaired timing and sequencing, abnormal tone and unbalanced muscle activation.  Prior to hospitalization, patient was independent  with mobility and lived with Spouse in a House home.  Home access is 4 (front door, not usually used)Stairs to enter.  Patient will benefit from skilled PT intervention to maximize safe functional mobility, minimize fall risk and decrease caregiver burden for planned discharge home with 24 hour supervision.  Anticipate patient will benefit from follow up [place] at discharge.  PT - End of Session Activity Tolerance: Tolerates < 10 min activity with changes in vital signs Endurance Deficit: Yes Endurance Deficit Description: SOBOE- rolling PT Assessment Rehab Potential: Good Barriers to Discharge: Decreased caregiver support PT Plan PT Intensity: Minimum of 1-2 x/day ,45 to 90 minutes PT Frequency: 5 out of 7 days PT Duration Estimated Length of Stay: 25-27 days PT Treatment/Interventions: Ambulation/gait training;Balance/vestibular training;Community reintegration;Discharge planning;Neuromuscular re-education;Functional mobility training;DME/adaptive equipment instruction;Patient/family education;Stair training;Therapeutic Activities;Therapeutic Exercise;UE/LE Strength taining/ROM;UE/LE Coordination activities;Wheelchair propulsion/positioning PT Recommendation Follow Up Recommendations: 24 hour supervision/assistance;Home health PT;Outpatient PT Patient destination: Home Equipment Recommended: To be determined  Skilled Therapeutic Intervention-   treatment 1 today: w/c propulsion using hemi technique x 100' x 2 with supervision, on level tile without turns.  neuromuscular re-education via demo, visual  feedback, manual and VCs for R hip abductor/adductor activation in supine hooklying.  Treatment  2: bed mobility supine > sit via L sidelying with mod assist, mod cues for technique.  Pt tends to attempt to push bil LEs off end of bed and use abdominals to sit up, but is unable.  With cues to roll L and assistance to move LEs off bed, pt is able to push up with LUE.  Squat pivot to R with max assist.  Up/down 1 step with 1 rail; ascending with L foot, descending backwards with R foot.  PT Evaluation Precautions/Restrictions   General   Vital SignsTherapy Vitals Temp: 98.3 F (36.8 C) Temp src: Oral Pulse Rate: 48 Resp: 16 BP: 120/69 mmHg Patient Position, if appropriate: Sitting Oxygen Therapy SpO2: 96 % O2 Device: None (Room air) Pain Pain Assessment Pain Assessment: No/denies pain Home Living/Prior Functioning Home Living Living Arrangements: Spouse/significant other Available Help at Discharge: Family;Available 24 hours/day Type of Home: House Home Access: Stairs to enter [employer] of Steps: 4 (front door, not usually used) Entrance Stairs-Rails: Can reach both (front door has rails) Home Layout: Two level;Bed/bath upstairs;1/2 bath on main level Alternate Level Stairs-Number of Steps: flight Alternate Level Stairs-Rails: Can reach both;Left;Right  Lives With: Spouse Prior Function Vocation: Full time employment Comments: PTA pt very independent and active; still drove and mowed lawn  Vision/Perception - sufficient for functional mobility    Cognition Overall Cognitive Status: Impaired/Different from baseline Arousal/Alertness: Awake/alert Orientation Level: Oriented X4 Attention: Selective Selective Attention: Appears intact Memory: Impaired Memory Impairment: Decreased short term memory Decreased Short Term Memory: Functional basic Awareness: Appears intact Problem Solving: Impaired Problem Solving Impairment: Functional complex Safety/Judgment:  Appears intact Sensation Sensation Stereognosis: Not tested Hot/Cold: Appears Intact Coordination Finger Nose Finger Test: unable to with RUE due to hemiparesis Motor  Motor Motor: Hemiplegia;Abnormal tone;Primitive reflexes present Motor - Skilled Clinical Observations: associated reactions RUE and RLE with body movements and yawning  Mobility Bed Mobility Bed Mobility: Rolling Right;Rolling Left;Left Sidelying to Sit Rolling Right: 5: Supervision Rolling Left: 3: Mod assist Rolling Left Details: Tactile cues for placement;Manual facilitation for placement;Verbal cues for sequencing Left Sidelying to Sit: 3: Mod assist Left Sidelying to Sit Details (indicate cue type and reason): for bil LE management Transfers Transfers: Yes Squat Pivot Transfers: 1: +2 Total assist Squat Pivot Transfer Details: Manual facilitation for weight shifting;Manual facilitation for placement;Tactile cues for weight beaing Locomotion  Ambulation Ambulation: Yes Ambulation/Gait Assistance: 1: +2 Total assist Assistive device: None Ambulation/Gait Assistance Details: Verbal cues for sequencing;Manual facilitation for placement;Manual facilitation for weight shifting;Manual facilitation for weight bearing Gait Gait: Yes Gait Pattern: Impaired Gait Pattern: Step-to pattern;Narrow base of support;Trunk rotated posteriorly on right;Lateral hip instability;Decreased weight shift to left;Decreased hip/knee flexion - right;Decreased stride length;Decreased step length - right;Decreased step length - left;Right flexed knee in stance;Right genu recurvatum;Decreased dorsiflexion - right Gait velocity: extremely slow Wheelchair Mobility Wheelchair Mobility: Yes Wheelchair Assistance: 1: +1 Total assist Wheelchair Parts Management: Needs assistance  Trunk/Postural Assessment  Cervical Assessment Cervical Assessment: Within Functional Limits Thoracic Assessment Thoracic Assessment: Within Functional  Limits Lumbar Assessment Lumbar Assessment: Within Functional Limits Postural Control Postural Control: Within Functional Limits  Balance Balance Balance Assessed: Yes Static Sitting Balance Static Sitting - Balance Support: Feet supported;Left upper extremity supported (stand by assistance) Dynamic Sitting Balance Dynamic Sitting - Level of Assistance: 4: Min assist Static Standing Balance Static Standing - Level of Assistance: 1: +2 Total assist Extremity Assessment      RLE Assessment RLE Assessment: Exceptions to [facility] (hamstrings and heel cord tight) RLE  Strength RLE Overall Strength Comments: grossly : hip flex/abd/add, 2/5, hip ext 2-/5, knee ext 2-/5, knee flex/ankle PF/DF 0/5 LLE Assessment LLE Assessment: Within Functional Limits  FIM:  FIM - Bed/Chair Transfer Bed/Chair Transfer: 3: Supine > Sit: Mod A (lifting assist/Pt. 50-74%/lift 2 legs;3: Sit > Supine: Mod A (lifting assist/Pt. 50-74%/lift 2 legs);1: Two helpers FIM - Locomotion: Wheelchair Locomotion: Wheelchair: 1: Total Assistance/staff pushes wheelchair (Pt<25%) FIM - Locomotion: Ambulation Ambulation/Gait Assistance: 1: +2 Total assist FIM - Locomotion: Stairs Locomotion: Scientist, physiological: Insurance account manager - 1 Locomotion: Stairs: 1: Two helpers   Refer to [employer] for [place]  Recommendations for other services: None  Discharge Criteria: Patient will be discharged from PT if patient refuses treatment 3 consecutive times without medical reason, if treatment goals not met, if there is a change in medical status, if patient makes no progress towards goals or if patient is discharged from hospital.  The above assessment, treatment plan, treatment alternatives and goals were discussed and mutually agreed upon: by patient  Shelina Luo 02/09/2014, 4:29 PM

## 2014-02-09 NOTE — Progress Notes (Signed)
Subjective/Complaints: 64 y.o. right-handed male with history of CVA x2, DM type 2, OSA, who was admitted 02/03/2014 with acute onset right-sided weakness, right facial weakness with difficulty speaking and was noted to have severely elevated BP requiring labetalol and nicardipine. CCT negative for acute changes. CTA head/neck with mild irregularity of Left PCA question thalamostriate type ischemia, no large vessel stenosis. Patient treated with TPA and MRI of the head 2x2.5 cm acute infarct left basal ganglia radiating to white matter tracts. MRA of the head with no major occlusion or stenosis. Echocardiogram ejection fraction 60% without emboli. Carotid Dopplers with no ICA stenosis. 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 Swallow evaluation done and showed evidence of pharyngeal dysphagia with delay in swallow and was placed on dysphagia 3, thin liquids  No issues overnite, pt is dysarthric Review of Systems - Negative except weakness on Left side Objective: Vital Signs: Blood pressure 162/77, pulse 43, temperature 97.6 F (36.4 C), temperature source Oral, resp. rate 17, SpO2 96.00%. No results found. Results for orders placed during the hospital encounter of 02/08/14 (from the past 72 hour(s))  GLUCOSE, CAPILLARY     Status: Abnormal   Collection Time    02/08/14  5:34 PM      Result Value Ref Range   Glucose-Capillary 112 (*) 70 - 99 mg/dL   Comment 1 Notify RN    GLUCOSE, CAPILLARY     Status: Abnormal   Collection Time    02/08/14  8:27 PM      Result Value Ref Range   Glucose-Capillary 170 (*) 70 - 99 mg/dL   Comment 1 Notify RN    CBC WITH DIFFERENTIAL     Status: Abnormal (Preliminary result)   Collection Time    02/09/14  5:50 AM      Result Value Ref Range   WBC PENDING  4.0 - 10.5 K/uL   RBC 5.01  4.22 - 5.81 MIL/uL   Hemoglobin 16.0  13.0 - 17.0 g/dL   HCT 43.6  39.0 - 52.0 %   MCV 87.0  78.0 - 100.0 fL   MCH  31.9  26.0 - 34.0 pg   MCHC 36.7 (*) 30.0 - 36.0 g/dL   RDW 13.0  11.5 - 15.5 %   Platelets 194  150 - 400 K/uL   Neutrophils Relative % PENDING  43 - 77 %   Neutro Abs PENDING  1.7 - 7.7 K/uL   Band Neutrophils PENDING  0 - 10 %   Lymphocytes Relative PENDING  12 - 46 %   Lymphs Abs PENDING  0.7 - 4.0 K/uL   Monocytes Relative PENDING  3 - 12 %   Monocytes Absolute PENDING  0.1 - 1.0 K/uL   Eosinophils Relative PENDING  0 - 5 %   Eosinophils Absolute PENDING  0.0 - 0.7 K/uL   Basophils Relative PENDING  0 - 1 %   Basophils Absolute PENDING  0.0 - 0.1 K/uL   WBC Morphology PENDING     RBC Morphology PENDING     Smear Review PENDING     nRBC PENDING  0 /100 WBC   Metamyelocytes Relative PENDING     Myelocytes PENDING     Promyelocytes Absolute PENDING     Blasts PENDING    COMPREHENSIVE METABOLIC PANEL     Status: Abnormal   Collection Time    02/09/14  5:50 AM      Result Value Ref Range  Sodium 144  137 - 147 mEq/L   Potassium 3.7  3.7 - 5.3 mEq/L   Chloride 105  96 - 112 mEq/L   CO2 24  19 - 32 mEq/L   Glucose, Bld 140 (*) 70 - 99 mg/dL   BUN 28 (*) 6 - 23 mg/dL   Creatinine, Ser 1.17  0.50 - 1.35 mg/dL   Calcium 9.6  8.4 - 10.5 mg/dL   Total Protein 7.2  6.0 - 8.3 g/dL   Albumin 3.5  3.5 - 5.2 g/dL   AST 35  0 - 37 U/L   ALT 44  0 - 53 U/L   Alkaline Phosphatase 56  39 - 117 U/L   Total Bilirubin 0.6  0.3 - 1.2 mg/dL   GFR calc non Af Amer 65 (*) >90 mL/min   GFR calc Af Amer 75 (*) >90 mL/min   Comment: (NOTE)     The eGFR has been calculated using the CKD EPI equation.     This calculation has not been validated in all clinical situations.     eGFR's persistently <90 mL/min signify possible Chronic Kidney     Disease.     HEENT: normal Cardio: RRR Resp: CTA B/L and unlabored GI: BS positive and non distended Extremity:  Pulses positive and No Edema Skin:   Intact Neuro: Flat, Cranial Nerve II-XII normal, Normal Sensory, Abnormal Motor 0/5 in RUE, 2-/5 R  hip/knee ext synergy otherwise 0/5 and Tone:  Hypertonia Musc/Skel:  Normal Gen NAD   Assessment/Plan: 1. Functional deficits secondary to Left BG infarct with R spastic HP which require 3+ hours per day of interdisciplinary therapy in a comprehensive inpatient rehab setting. Physiatrist is providing close team supervision and 24 hour management of active medical problems listed below. Physiatrist and rehab team continue to assess barriers to discharge/monitor patient progress toward functional and medical goals. FIM:                   Comprehension Comprehension Mode: Auditory Comprehension: 5-Understands complex 90% of the time/Cues < 10% of the time  Expression Expression Mode: Verbal Expression: 5-Expresses basic needs/ideas: With no assist  Social Interaction Social Interaction: 7-Interacts appropriately with others - No medications needed.  Problem Solving Problem Solving: 6-Solves complex problems: With extra time  Memory Memory: 6-More than reasonable amt of time   Medical Problem List and Plan:  1. 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  2. DVT Prophylaxis/Anticoagulation: Subcutaneous Lovenox. Monitor platelet counts any signs of bleeding  3. Pain Management: Tylenol as needed  4. H/o depression with anxiety/Mood: Stable at this time. LCSW to follow for evaluation. Continue Abilify 2.5 mg daily and Zoloft 150 mg daily. Will use ativan 0.5 mg tid prn for anxiety.  5. Neuropsych: This patient is capable of making decisions on his own behalf.  6. Diabetes mellitus: Hemoglobin A1c 8.1. Check blood sugars a.c. and at bedtime.  7. Hypertension: Will monitor BP with bid checks--continue Norvasc 10 mg daily, Bystolic 48m daily, Diovan 160 mg daily, hydrochlorothiazide 12.5 mg daily. Monitor with increased mobility  8. Hyperlipidemia: On Zocor  9. Constipation: Will schedule Miralax for bowel program.  10.  OSA: continue CPAP to help with sleep hygiene and energy levels.  11. Spasticity: Add low dose baclofen. Splinting may be required as well. 12.  Bradycardia adjust BB LOS (Days) 1 A FACE TO FACE EVALUATION WAS PERFORMED  DakotaANDREW Hall 02/09/2014, 7:29 AM

## 2014-02-09 NOTE — Evaluation (Signed)
Speech Language Pathology Assessment and Plan  Patient Details  Name: Dakota Hall MRN: [id number] Date of Birth: September 24, 1951  SLP Diagnosis: Dysarthria;Cognitive Impairments;Dysphagia  Rehab Potential: Excellent ELOS: 25-28 days   Today's Date: 02/09/2014 Time: 1330-1430 Time Calculation (min): 60 min  Problem List:  Patient Active Problem List   Diagnosis Date Noted  . CVA (cerebral infarction) 02/08/2014  . Depression with anxiety   . 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 Obesity   . Stroke 02/03/2014  . Unspecified cerebral artery occlusion with cerebral infarction 07/20/2013  . Stroke, small vessel 05/18/2012  . Slurred speech 05/15/2012  . HTN (hypertension) 05/15/2012  . Noncompliance with medication regimen 05/15/2012  . DM (diabetes mellitus) 05/15/2012  . Vision changes 05/15/2012   Past Medical History:  Past Medical History  Diagnosis Date  . Diabetes mellitus   . Hypertension   . Stroke July 2013    left paramedian pontine, incidental right parietal subcortical. both d/t small vessel disease  . Stroke March 2015    left basal ganglia secondary to small vessel disease  . Obesity   . Depression with anxiety   . OSA on CPAP    Past Surgical History:  Past Surgical History  Procedure Laterality Date  . Tonsillectomy    . Anal fissure repair      Assessment / Plan / Recommendation Clinical Impression Pt is a 63 y.o. right-handed male with h/o CVA x2, DM type 2, OSA, who was admitted 02/03/14 with acute onset right-sided weakness, right facial weakness with difficulty speaking and was noted to have severely elevated BP requiring labetalol and nicardipine. CCT negative for acute changes. CTA head/neck with mild irregularity of Left PCA question thalamostriate type ischemia, no large vessel stenosis. Pt treated with TPA and MRI of the head 2x2.5 cm acute infarct left basal ganglia radiating to white matter tracts. MRA of the head with no major occlusion or stenosis.  Echocardiogram ejection fraction 60% without emboli. Carotid Dopplers with no ICA stenosis. 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 Swallow evaluation done and showed evidence of pharyngeal dysphagia with delay in swallow and was placed on dysphagia 3, thin liquids. Pt with resultant dense right hemiparesis, poor safety awareness and moderate dysarthria impacting articulatory and respiratory processes. SLP bedside swallowing evaluation revealed no overt s/s of aspiration with current textures with no cues needed for safe swallowing strategies. Pt exhibits a moderate dysarthria as well as mild impairments with complex problem solving and working memory. Pt will benefit from continued SLP services to maximize functional communication and independence as well as swallowing safety prior to discharge home with wife.   Skilled Therapeutic Interventions          SLP bedside-swallow and cognitive-linguistic evaluation were completed with results and recommendations reviewed with the patient.   SLP Assessment  Patient will need skilled Speech Lanaguage Pathology Services during CIR admission    Recommendations  Diet Recommendations: Dysphagia 3 (Mechanical Soft);Thin liquid Liquid Administration via: Cup;No straw Medication Administration: Whole meds with liquid Supervision: Patient able to self feed Compensations: Slow rate;Small sips/bites;Check for pocketing Postural Changes and/or Swallow Maneuvers: Seated upright 90 degrees;Upright 30-60 min after meal Oral Care Recommendations: Oral care BID Patient destination: Home Follow up Recommendations: Home Health SLP;Outpatient SLP Equipment Recommended: None recommended by SLP    SLP Frequency 5 out of 7 days   SLP Treatment/Interventions Cognitive remediation/compensation;Cueing hierarchy;Dysphagia/aspiration precaution training;Environmental controls;Functional tasks;Internal/external  aids;Medication managment;Oral motor exercises;Patient/family education;Speech/Language facilitation;Therapeutic Activities  Pain Pain Assessment Pain Assessment: No/denies pain Prior Functioning Cognitive/Linguistic Baseline: Within functional limits Type of Home: House  Lives With: Spouse Available Help at Discharge: Family Vocation: Full time employment  Short Term Goals: Week 1: SLP Short Term Goal 1 (Week 1): Pt will demonstrate adequate mastication and oral clearance with trials of regular textures with Min cues SLP Short Term Goal 2 (Week 1): Pt will utilize speech intelligibility strategies at the sentence level with Mod cues SLP Short Term Goal 3 (Week 1): Pt will recall 4 out of 4 speech intelligibility strategies with Min cues SLP Short Term Goal 4 (Week 1): Pt will demonstrate complex problem solving during functional task with Supervision level cues SLP Short Term Goal 5 (Week 1): Pt will utilize internal/external memory aids to facilitate working memory during functional tasks with Supervision level cues  See FIM for current functional status Refer to Care Plan for Long Term Goals  Recommendations for other services: None  Discharge Criteria: Patient will be discharged from SLP if patient refuses treatment 3 consecutive times without medical reason, if treatment goals not met, if there is a change in medical status, if patient makes no progress towards goals or if patient is discharged from hospital.  The above assessment, treatment plan, treatment alternatives and goals were discussed and mutually agreed upon: by patient   [name], M.A. CCC-SLP [phone]  [name] 02/09/2014, 3:52 PM

## 2014-02-09 NOTE — Progress Notes (Signed)
Social Work Assessment and Plan Social Work Assessment and Plan  Patient Details  Name: Dakota Hall MRN: [id number] Date of Birth: [birth year]  Today's Date: 02/09/2014  Problem List:  Patient Active Problem List   Diagnosis Date Noted  . CVA (cerebral infarction) 02/08/2014  . Depression with anxiety   . 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 Obesity   . Stroke 02/03/2014  . Unspecified cerebral artery occlusion with cerebral infarction 07/20/2013  . Stroke, small vessel 05/18/2012  . Slurred speech 05/15/2012  . HTN (hypertension) 05/15/2012  . Noncompliance with medication regimen 05/15/2012  . DM (diabetes mellitus) 05/15/2012  . Vision changes 05/15/2012   Past Medical History:  Past Medical History  Diagnosis Date  . Diabetes mellitus   . Hypertension   . Stroke July 2013    left paramedian pontine, incidental right parietal subcortical. both d/t small vessel disease  . Stroke March 2015    left basal ganglia secondary to small vessel disease  . Obesity   . Depression with anxiety   . OSA on CPAP    Past Surgical History:  Past Surgical History  Procedure Laterality Date  . Tonsillectomy    . Anal fissure repair     Social History:  reports that he has never smoked. He has never used smokeless tobacco. He reports that he does not drink alcohol or use illicit drugs.  Family / Support Systems Marital Status: Married Patient Roles: Spouse;Parent;Other (Comment) (Employee) Spouse/Significant Other: [name]  [phone]  [phone]  [phone] Children: Vanita Panda  [phone] Other Supports: Anna-daughter  Anticipated Caregiver: Wife hopes to be eligible for FMLA checking with company since took leave year ago Ability/Limitations of Caregiver: Wife works during the day but trying to make arrangements Caregiver Availability: Other (Comment) (Working on plan) Family Dynamics: Close knit family who are supportive of one another and involved with one another.   Pt is grateful for the family he has and states; " They will do anything for you."  Social History Preferred language: English Religion: Baptist Cultural Background: No issues Education: [employer] Educated Read: Yes Write: Yes Employment Status: Employed Name of Employer: [employer]-processed policies Return to Work Plans: Unsure at this time, is hopeful to return Fish farm manager Issues: No issues Guardian/Conservator: None-according to MD pt is capable of making his own decisions while here.   Abuse/Neglect Physical Abuse: Denies Verbal Abuse: Denies Sexual Abuse: Denies Exploitation of patient/patient's resources: Denies Self-Neglect: Denies  Emotional Status Pt's affect, behavior adn adjustment status: Pt is motivated to improve and regain his independence or as much as he can here.  He becomes tearful at times and hopes he can endure this program.. Encouraged him and offered support.  He is still trying to deal with his stroke and deficits.  He is keeping the faith and will push forward. Recent Psychosocial Issues: other medical issues-reports waqs trying to prevent another stroke and then it happened Pyschiatric History: History of depression takes zoloft and reports it seems to work.  Would benefit from Neuro-psych seeing him while here.  Will make referral and also assist him with this coping while here. Substance Abuse History: No issues  Patient / Family Perceptions, Expectations & Goals Pt/Family understanding of illness & functional limitations: Pt and wife can explain his stroke and deficits.  Both are hopeful he will do well here and regain as much function as possible.  They talk with the MD and feel their concerns and needs are being addressed. Premorbid pt/family roles/activities: Husband, father,  Enmployee, Church member, etc Anticipated changes in roles/activities/participation: resume Pt/family expectations/goals: Pt states; " I want to take care  of myself, by the time I go home."  Wife states; " I am hopeful he wil do well here, but will do what is needed."  [employer] Resources Community Agencies: None Premorbid Home Care/DME Agencies: None Transportation available at discharge: Wife and children Resource referrals recommended: Support group (specify) (CVA Support group)  Discharge Planning Living Arrangements: Spouse/significant other Support Systems: Spouse/significant other;Children;Other relatives;Friends/neighbors;Church/faith community Type of Residence: Private residence Insurance Resources: Media plannerrivate Insurance (specify) Counselling psychologist([employer]) Financial Resources: Employment;Family Support Financial Screen Referred: No Living Expenses: Lives with family Money Management: Patient;Spouse Does the patient have any problems obtaining your medications?: No Home Management: Both he and wife Patient/Family Preliminary Plans: Return home with wife who is trying to get a FMLA to be able to provide care if needed at discharge.  Wife is willing to come in and do what she needs to do.  Pt doesn;t want to burden her.  Aware of team conference next Wed. Social Work Anticipated Follow Up Needs: HH/OP;Support Group  Clinical Impression Motivated gentleman who needs encouragement that he can do this program, seems over whelmed today-just the first day of therapy.  Provided support and will refer to Neuro-psych to see to assist With his coping.  Supportive wife who is willing to assist.  Hopefully as adjusts to the program will be more comfortable and confident.  Await team's evaluations.   Lucy Chrisupree, Nicky Kras G 02/09/2014, 2:42 PM

## 2014-02-10 ENCOUNTER — Inpatient Hospital Stay (HOSPITAL_COMMUNITY): Payer: Managed Care, Other (non HMO) | Admitting: *Deleted

## 2014-02-10 ENCOUNTER — Inpatient Hospital Stay (HOSPITAL_COMMUNITY): Payer: Managed Care, Other (non HMO) | Admitting: Occupational Therapy

## 2014-02-10 ENCOUNTER — Inpatient Hospital Stay (HOSPITAL_COMMUNITY): Payer: Managed Care, Other (non HMO)

## 2014-02-10 DIAGNOSIS — E119 Type 2 diabetes mellitus without complications: Secondary | ICD-10-CM

## 2014-02-10 DIAGNOSIS — I69991 Dysphagia following unspecified cerebrovascular disease: Secondary | ICD-10-CM

## 2014-02-10 DIAGNOSIS — R1313 Dysphagia, pharyngeal phase: Secondary | ICD-10-CM

## 2014-02-10 DIAGNOSIS — I69959 Hemiplegia and hemiparesis following unspecified cerebrovascular disease affecting unspecified side: Secondary | ICD-10-CM

## 2014-02-10 DIAGNOSIS — I69922 Dysarthria following unspecified cerebrovascular disease: Secondary | ICD-10-CM

## 2014-02-10 DIAGNOSIS — I6330 Cerebral infarction due to thrombosis of unspecified cerebral artery: Secondary | ICD-10-CM

## 2014-02-10 LAB — GLUCOSE, CAPILLARY
GLUCOSE-CAPILLARY: 166 mg/dL — AB (ref 70–99)
GLUCOSE-CAPILLARY: 119 mg/dL — AB (ref 70–99)
GLUCOSE-CAPILLARY: 143 mg/dL — AB (ref 70–99)
Glucose-Capillary: 149 mg/dL — ABNORMAL HIGH (ref 70–99)

## 2014-02-10 NOTE — Progress Notes (Signed)
Speech Language Pathology Daily Session Note  Patient Details  Name: Dakota Hall Duer MRN: [id number] Date of Birth: 12/23/1950  Today's Date: 02/10/2014 Time: 1000-1045 Time Calculation (min): 45 min  Short Term Goals: Week 1: SLP Short Term Goal 1 (Week 1): Pt will demonstrate adequate mastication and oral clearance with trials of regular textures with Min cues SLP Short Term Goal 2 (Week 1): Pt will utilize speech intelligibility strategies at the sentence level with Mod cues SLP Short Term Goal 3 (Week 1): Pt will recall 4 out of 4 speech intelligibility strategies with Min cues SLP Short Term Goal 4 (Week 1): Pt will demonstrate complex problem solving during functional task with Supervision level cues SLP Short Term Goal 5 (Week 1): Pt will utilize internal/external memory aids to facilitate working memory during functional tasks with Supervision level cues  Skilled Therapeutic Interventions: Treatment focused on speech, cognitive, and swallowing goals. SLP facilitated session with education regarding speech intelligibility strategies. Pt performed 6-step sequencing task with supervision level verbal cues. He described the sequences of pictures, using his speech intelligibility strategies with Mod cues at the phrase level. Pt consumed cup sips of water throughout session with throat clear noted x1. Continue plan of care.   FIM:  Comprehension Comprehension Mode: Auditory Comprehension: 6-Follows complex conversation/direction: With extra time/assistive device Expression Expression Mode: Verbal Expression: 3-Expresses basic 50 - 74% of the time/requires cueing 25 - 50% of the time. Needs to repeat parts of sentences. Social Interaction Social Interaction: 6-Interacts appropriately with others with medication or extra time (anti-anxiety, antidepressant). Problem Solving Problem Solving: 5-Solves complex 90% of the time/cues < 10% of the time Memory Memory: 5-Recognizes or recalls 90% of  the time/requires cueing < 10% of the time FIM - Eating Eating Activity: 6: Swallowing techniques: self-managed  Pain  No/denies pain  Therapy/Group: Individual Therapy   Maxcine HamLaura Paiewonsky, M.A. CCC-SLP [phone]  Maxcine Hamaiewonsky, Ariya Bohannon 02/10/2014, 2:01 PM

## 2014-02-10 NOTE — Progress Notes (Signed)
Physical Therapy Session Note  Patient Details  Name: Dakota Hall MRN: [id number] Date of Birth: 08/05/1951  Today's Date: 02/10/2014 Time:  -  9:02-10:00 (58min)     Short Term Goals: Week 1:  PT Short Term Goal 1 (Week 1): Pt will roll L with supervision, 1 VC. PT Short Term Goal 2 (Week 1): Pt will transfer to L with max assist of 1 person. PT Short Term Goal 3 (Week 1): Pt will propel w/c x 150' iwth supervision. PT Short Term Goal 4 (Week 1): Pt will perform gait x 20' with assistance of 2 people.  Skilled Therapeutic Interventions/Progress Updates:  Tx focused on NMR for RLE activation and balance, transfer training and bed mobility, and standing in // bars.  Instructed pt in WC<>mat transfers with max A. Pt needing sequencing cues as well as assist completing turn.   Performed lateral scooting R/L on mat for practice clearning hips with anterior translation.  Static sitting balance with cues for even weight bearing. Dynamic sitting balance with reaching/placing rings on cone as well as pushing up from R forearm 2x10 with support at R shoulder.   Supine<>sit x2 for practice and instruction; max>>mod A to pt's L side. Pt modifies technique well with verbal cues.   Supine NMR including:  - heel slides; - hip ABD/ADD - mass hip/knee flex/ext with manual resistance - hooklying lower trunk rotation - Bridging x10 with manual facilitation for full R hip ext.  Attempted additional squat-pivot transfers, but pt too fatigued to safely complete.  Pt propelled WC with hemi-technique., 39x150' with S and cues for avoiding L obstacles.      Therapy Documentation Precautions:  Precautions Precautions: Fall Precaution Comments: Rt hemiparesis  Restrictions Weight Bearing Restrictions: No General:   Vital Signs: Therapy Vitals Temp: 97.5 F (36.4 C) Temp src: Oral Pulse Rate: 50 Resp: 18 BP: 164/89 mmHg Patient Position, if appropriate: Sitting Oxygen Therapy SpO2: 96 % O2  Device: CPAP Pain: Pain Assessment Pain Assessment: 0-10 Pain Score: 3  Pain Type: Acute pain Pain Location: Shoulder Pain Orientation: Right Pain Intervention(s): Repositioned     See FIM for current functional status  Therapy/Group: Individual Therapy Clydene Lamingole Skyy Mcknight, PT, DPT   Clydene LamingKAMPEN, Fareeha Evon M 02/10/2014, 9:33 AM

## 2014-02-10 NOTE — Progress Notes (Signed)
Patient places himself on/off CPAP. 

## 2014-02-10 NOTE — Progress Notes (Signed)
Occupational Therapy Session Note  Patient Details  Name: Dakota Hall MRN: [id number] Date of Birth: 05/03/1951  Today's Date: 02/10/2014 Time: 0805-0900 and 1110-1140 Time Calculation (min): 55 min and 30 min  Short Term Goals: Week 1:  OT Short Term Goal 1 (Week 1): Pt will be able to complete a squat pivot transfer to the toilet using grab bars with mod A x 1.  OT Short Term Goal 2 (Week 1): Pt will stand at sink with min A to allow him to complete LB dressing. OT Short Term Goal 3 (Week 1): Pt will don shirt with min A. OT Short Term Goal 4 (Week 1): Pt will don pants with min A. OT Short Term Goal 5 (Week 1): Pt will be able to cleanse himself on toilet with steady A.  Skilled Therapeutic Interventions/Progress Updates:    Visit 1: No c/o pain at start of session. TX:  Pt seen for B/D this am with a focus on hemidressing techniques and standing balance. Pt transferred to sitting from supine with only min to mod A and then to w/c with max A x 2. He stood to sink 3x and each time he demonstrated improvement with his midline stability. He has increased tone in RLE which pulls his leg into internal rotation at the knee. The  NT assisted with his LB self care as 2 people were needed.  Once self care was completed he worked on standing at the sink 3x for 2 min each with L hand supporting him on the sink and RUE weight bearing with min to mod A.   Pt was taken to gym in his w/c for his next PT session.  Visit 2: pain:  No c/o pain at rest. Tx: Pt transferred to mat with mod A x2.   Pt stated that he was having R sh pain only during PROM of sh flex greater than 90.  Pt has increased tone in RUE including scapula with limited passive scapular upward rotation.  Advised pt to not passively lift R arm above 90.  Prom to R scapula and shoulder to loosen tight muscles. No pain with therapy.  Pt then engaged in towel slides on bedside table with therapist guiding his arm. Pt eventually demonstrated slight  active movement in horizontal/ sh adduction and slight elbow flexion with gravity eliminated.  Pt was very excited about this. He worked in supine on mat on PNF D1, D2 patterns with active sh adduction.  Pt transferred back to w/c with mod A x1 and he was taken back to his room.  Therapy Documentation Precautions:  Precautions Precautions: Fall Precaution Comments: Rt hemiparesis  Restrictions Weight Bearing Restrictions: No    Vital Signs: Therapy Vitals Temp: 97.5 F (36.4 C) Temp src: Oral Pulse Rate: 50 Resp: 18 BP: 164/89 mmHg Patient Position, if appropriate: Sitting Oxygen Therapy SpO2: 96 % O2 Device: CPAP  ADL: ADL ADL Comments: Refer to FIM See FIM for current functional status  Therapy/Group: Individual Therapy  [name] 02/10/2014, 9:42 AM

## 2014-02-10 NOTE — Progress Notes (Signed)
Subjective/Complaints: 63 y.o. right-handed male with history of CVA x2, DM type 2, OSA, who was admitted 02/03/2014 with acute onset right-sided weakness, right facial weakness with difficulty speaking and was noted to have severely elevated BP requiring labetalol and nicardipine. CCT negative for acute changes. CTA head/neck with mild irregularity of Left PCA question thalamostriate type ischemia, no large vessel stenosis. Patient treated with TPA and MRI of the head 2x2.5 cm acute infarct left basal ganglia radiating to white matter tracts. MRA of the head with no major occlusion or stenosis. Echocardiogram ejection fraction 60% without emboli. Carotid Dopplers with no ICA stenosis. 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 Swallow evaluation done and showed evidence of pharyngeal dysphagia with delay in swallow and was placed on dysphagia 3, thin liquids  No issues overnite, pt is dysarthric, no dizziness no SOB Review of Systems - Negative except weakness on Left side Objective: Vital Signs: Blood pressure 150/84, pulse 41, temperature 97.5 F (36.4 C), temperature source Oral, resp. rate 18, SpO2 96.00%. No results found. Results for orders placed during the hospital encounter of 02/08/14 (from the past 72 hour(s))  GLUCOSE, CAPILLARY     Status: Abnormal   Collection Time    02/08/14  5:34 PM      Result Value Ref Range   Glucose-Capillary 112 (*) 70 - 99 mg/dL   Comment 1 Notify RN    GLUCOSE, CAPILLARY     Status: Abnormal   Collection Time    02/08/14  8:27 PM      Result Value Ref Range   Glucose-Capillary 170 (*) 70 - 99 mg/dL   Comment 1 Notify RN    CBC WITH DIFFERENTIAL     Status: Abnormal   Collection Time    02/09/14  5:50 AM      Result Value Ref Range   WBC 8.6  4.0 - 10.5 K/uL   Comment: WHITE COUNT CONFIRMED ON SMEAR   RBC 5.01  4.22 - 5.81 MIL/uL   Hemoglobin 16.0  13.0 - 17.0 g/dL   HCT 43.6  39.0 - 52.0  %   MCV 87.0  78.0 - 100.0 fL   MCH 31.9  26.0 - 34.0 pg   MCHC 36.7 (*) 30.0 - 36.0 g/dL   RDW 13.0  11.5 - 15.5 %   Platelets 194  150 - 400 K/uL   Neutrophils Relative % 55  43 - 77 %   Lymphocytes Relative 36  12 - 46 %   Monocytes Relative 7  3 - 12 %   Eosinophils Relative 2  0 - 5 %   Basophils Relative 0  0 - 1 %   Neutro Abs 4.7  1.7 - 7.7 K/uL   Lymphs Abs 3.1  0.7 - 4.0 K/uL   Monocytes Absolute 0.6  0.1 - 1.0 K/uL   Eosinophils Absolute 0.2  0.0 - 0.7 K/uL   Basophils Absolute 0.0  0.0 - 0.1 K/uL   Smear Review MORPHOLOGY UNREMARKABLE    COMPREHENSIVE METABOLIC PANEL     Status: Abnormal   Collection Time    02/09/14  5:50 AM      Result Value Ref Range   Sodium 144  137 - 147 mEq/L   Potassium 3.7  3.7 - 5.3 mEq/L   Chloride 105  96 - 112 mEq/L   CO2 24  19 - 32 mEq/L   Glucose, Bld 140 (*) 70 - 99 mg/dL  BUN 28 (*) 6 - 23 mg/dL   Creatinine, Ser 1.17  0.50 - 1.35 mg/dL   Calcium 9.6  8.4 - 10.5 mg/dL   Total Protein 7.2  6.0 - 8.3 g/dL   Albumin 3.5  3.5 - 5.2 g/dL   AST 35  0 - 37 U/L   ALT 44  0 - 53 U/L   Alkaline Phosphatase 56  39 - 117 U/L   Total Bilirubin 0.6  0.3 - 1.2 mg/dL   GFR calc non Af Amer 65 (*) >90 mL/min   GFR calc Af Amer 75 (*) >90 mL/min   Comment: (NOTE)     The eGFR has been calculated using the CKD EPI equation.     This calculation has not been validated in all clinical situations.     eGFR's persistently <90 mL/min signify possible Chronic Kidney     Disease.  GLUCOSE, CAPILLARY     Status: Abnormal   Collection Time    02/09/14  7:27 AM      Result Value Ref Range   Glucose-Capillary 140 (*) 70 - 99 mg/dL  GLUCOSE, CAPILLARY     Status: Abnormal   Collection Time    02/09/14 11:30 AM      Result Value Ref Range   Glucose-Capillary 127 (*) 70 - 99 mg/dL  GLUCOSE, CAPILLARY     Status: Abnormal   Collection Time    02/09/14  4:51 PM      Result Value Ref Range   Glucose-Capillary 147 (*) 70 - 99 mg/dL  GLUCOSE,  CAPILLARY     Status: Abnormal   Collection Time    02/09/14  9:34 PM      Result Value Ref Range   Glucose-Capillary 146 (*) 70 - 99 mg/dL   Comment 1 Notify RN    GLUCOSE, CAPILLARY     Status: Abnormal   Collection Time    02/10/14  7:23 AM      Result Value Ref Range   Glucose-Capillary 149 (*) 70 - 99 mg/dL     HEENT: normal Cardio: RRR Resp: CTA B/L and unlabored GI: BS positive and non distended Extremity:  Pulses positive and No Edema Skin:   Intact Neuro: Flat, Cranial Nerve II-XII normal, Normal Sensory, Abnormal Motor 0/5 in RUE, 2-/5 R hip/knee ext synergy otherwise 0/5 and Tone:  Hypertonia Musc/Skel:  Normal Gen NAD   Assessment/Plan: 1. Functional deficits secondary to Left BG infarct with R spastic HP which require 3+ hours per day of interdisciplinary therapy in a comprehensive inpatient rehab setting. Physiatrist is providing close team supervision and 24 hour management of active medical problems listed below. Physiatrist and rehab team continue to assess barriers to discharge/monitor patient progress toward functional and medical goals. FIM: FIM - Bathing Bathing Steps Patient Completed: Chest;Right Arm;Abdomen Bathing: 2: Max-Patient completes 3-4 25f10 parts or 25-49%  FIM - Upper Body Dressing/Undressing Upper body dressing/undressing steps patient completed: Put head through opening of pull over shirt/dress Upper body dressing/undressing: 2: Max-Patient completed 25-49% of tasks FIM - Lower Body Dressing/Undressing Lower body dressing/undressing steps patient completed: Thread/unthread left underwear leg;Thread/unthread left pants leg Lower body dressing/undressing: 2: Max-Patient completed 25-49% of tasks     FIM - TAir cabin crewTransfers: 0-Activity did not occur  FIM - BIT sales professionalTransfer: 3: Supine > Sit: Mod A (lifting assist/Pt. 50-74%/lift 2 legs;3: Sit > Supine: Mod A (lifting assist/Pt. 50-74%/lift 2 legs);1:  Two helpers  FIM - Locomotion: Wheelchair Locomotion:  Wheelchair: 1: Total Assistance/staff pushes wheelchair (Pt<25%) FIM - Locomotion: Ambulation Ambulation/Gait Assistance: 1: +2 Total assist  Comprehension Comprehension Mode: Auditory Comprehension: 6-Follows complex conversation/direction: With extra time/assistive device  Expression Expression Mode: Verbal Expression: 3-Expresses basic 50 - 74% of the time/requires cueing 25 - 50% of the time. Needs to repeat parts of sentences.  Social Interaction Social Interaction: 6-Interacts appropriately with others with medication or extra time (anti-anxiety, antidepressant).  Problem Solving Problem Solving: 5-Solves basic problems: With no assist  Memory Memory: 5-Recognizes or recalls 90% of the time/requires cueing < 10% of the time   Medical Problem List and Plan:  1. 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  2. DVT Prophylaxis/Anticoagulation: Subcutaneous Lovenox. Monitor platelet counts any signs of bleeding  3. Pain Management: Tylenol as needed  4. H/o depression with anxiety/Mood: Stable at this time. LCSW to follow for evaluation. Continue Abilify 2.5 mg daily and Zoloft 150 mg daily. Will use ativan 0.5 mg tid prn for anxiety.  5. Neuropsych: This patient is capable of making decisions on his own behalf.  6. Diabetes mellitus: Hemoglobin A1c 8.1. Check blood sugars a.c. and at bedtime.  7. Hypertension: Will monitor BP with bid checks--continue Norvasc 10 mg daily, Bystolic 17m daily, Diovan 160 mg daily, hydrochlorothiazide 12.5 mg daily. Monitor with increased mobility  8. Hyperlipidemia: On Zocor  9. Constipation: Will schedule Miralax for bowel program.  10. OSA: continue CPAP to help with sleep hygiene and energy levels.  11. Spasticity: Add low dose baclofen. Splinting may be required as well. 12.  Bradycardia D/c BB LOS (Days) 2 A FACE TO FACE EVALUATION  WAS PERFORMED  Dakota Hall E 02/10/2014, 7:43 AM

## 2014-02-11 ENCOUNTER — Inpatient Hospital Stay (HOSPITAL_COMMUNITY): Payer: Managed Care, Other (non HMO) | Admitting: Occupational Therapy

## 2014-02-11 ENCOUNTER — Inpatient Hospital Stay (HOSPITAL_COMMUNITY): Payer: Managed Care, Other (non HMO) | Admitting: Speech Pathology

## 2014-02-11 ENCOUNTER — Inpatient Hospital Stay (HOSPITAL_COMMUNITY): Payer: Managed Care, Other (non HMO) | Admitting: Physical Therapy

## 2014-02-11 DIAGNOSIS — F3289 Other specified depressive episodes: Secondary | ICD-10-CM

## 2014-02-11 DIAGNOSIS — E119 Type 2 diabetes mellitus without complications: Secondary | ICD-10-CM

## 2014-02-11 DIAGNOSIS — I6350 Cerebral infarction due to unspecified occlusion or stenosis of unspecified cerebral artery: Secondary | ICD-10-CM

## 2014-02-11 DIAGNOSIS — F329 Major depressive disorder, single episode, unspecified: Secondary | ICD-10-CM

## 2014-02-11 DIAGNOSIS — I10 Essential (primary) hypertension: Secondary | ICD-10-CM

## 2014-02-11 LAB — GLUCOSE, CAPILLARY
GLUCOSE-CAPILLARY: 129 mg/dL — AB (ref 70–99)
Glucose-Capillary: 133 mg/dL — ABNORMAL HIGH (ref 70–99)
Glucose-Capillary: 125 mg/dL — ABNORMAL HIGH (ref 70–99)
Glucose-Capillary: 105 mg/dL — ABNORMAL HIGH (ref 70–99)

## 2014-02-11 NOTE — Progress Notes (Signed)
Speech Language Pathology Daily Session Note  Patient Details  Name: Dionne AnoRoger W Bartolo MRN: [id number] Date of Birth: 11/24/1950  Today's Date: 02/11/2014 Time: 1055-1140 Time Calculation (min): 45 min  Short Term Goals: Week 1: SLP Short Term Goal 1 (Week 1): Pt will demonstrate adequate mastication and oral clearance with trials of regular textures with Min cues SLP Short Term Goal 2 (Week 1): Pt will utilize speech intelligibility strategies at the sentence level with Mod cues SLP Short Term Goal 3 (Week 1): Pt will recall 4 out of 4 speech intelligibility strategies with Min cues SLP Short Term Goal 4 (Week 1): Pt will demonstrate complex problem solving during functional task with Supervision level cues SLP Short Term Goal 5 (Week 1): Pt will utilize internal/external memory aids to facilitate working memory during functional tasks with Supervision level cues  Skilled Therapeutic Interventions: Therapeutic intervention complete with short term goals addressed.  Patient was able to recall 3 of 4 strategies to increase speech intelligibility.  He required moderate verbal cues during conversational speech to implement those strategies.  Mildly complex problem solving activities required supervision/cues to complete tasks.  Continue with current treatment plan.   FIM:  Comprehension Comprehension Mode: Auditory Comprehension: 6-Follows complex conversation/direction: With extra time/assistive device Expression Expression Mode: Verbal Expression: 3-Expresses basic 50 - 74% of the time/requires cueing 25 - 50% of the time. Needs to repeat parts of sentences. Social Interaction Social Interaction: 6-Interacts appropriately with others with medication or extra time (anti-anxiety, antidepressant). Problem Solving Problem Solving: 5-Solves complex 90% of the time/cues < 10% of the time Memory Memory: 5-Recognizes or recalls 90% of the time/requires cueing < 10% of the time  Pain Pain  Assessment Pain Assessment: No/denies pain  Therapy/Group: Individual Therapy  Lenny PastelSturgill, Kasmira Cacioppo Leigh 02/11/2014, 1:16 PM

## 2014-02-11 NOTE — Progress Notes (Signed)
Dakota Hall is a 63 y.o. male 09/03/1951 [id number]  Subjective: No new complaints. No new problems. Slept well. Feeling OK.  Objective: Vital signs in last 24 hours: Temp:  [96.7 F (35.9 C)-97.6 F (36.4 C)] 97.6 F (36.4 C) (03/28 0530) Pulse Rate:  [44-50] 44 (03/28 0530) Resp:  [16-17] 17 (03/28 0530) BP: (146-189)/(70-89) 157/80 mmHg (03/28 0530) SpO2:  [91 %-96 %] 96 % (03/28 0530) Weight change:  Last BM Date: 02/10/14  Intake/Output from previous day: 03/27 0701 - 03/28 0700 In: 840 [P.O.:840] Out: 380 [Urine:380] Last cbgs: CBG (last 3)   Recent Labs  02/10/14 1643 02/10/14 2041 02/11/14 0744  GLUCAP 119* 143* 133*     Physical Exam General: No apparent distress   HEENT: not dry Lungs: Normal effort. Lungs clear to auscultation, no crackles or wheezes. Cardiovascular: Regular rate and rhythm, no edema Abdomen: S/NT/ND; BS(+) Musculoskeletal:  unchanged Neurological: No new neurological deficits Wounds: N/A    Skin: clear  Aging changes Mental state: Alert, oriented, cooperative In a w/c    Lab Results: BMET    Component Value Date/Time   NA 144 02/09/2014 0550   K 3.7 02/09/2014 0550   CL 105 02/09/2014 0550   CO2 24 02/09/2014 0550   GLUCOSE 140* 02/09/2014 0550   BUN 28* 02/09/2014 0550   CREATININE 1.17 02/09/2014 0550   CALCIUM 9.6 02/09/2014 0550   GFRNONAA 65* 02/09/2014 0550   GFRAA 75* 02/09/2014 0550   CBC    Component Value Date/Time   WBC 8.6 02/09/2014 0550   RBC 5.01 02/09/2014 0550   HGB 16.0 02/09/2014 0550   HCT 43.6 02/09/2014 0550   PLT 194 02/09/2014 0550   MCV 87.0 02/09/2014 0550   MCH 31.9 02/09/2014 0550   MCHC 36.7* 02/09/2014 0550   RDW 13.0 02/09/2014 0550   LYMPHSABS 3.1 02/09/2014 0550   MONOABS 0.6 02/09/2014 0550   EOSABS 0.2 02/09/2014 0550   BASOSABS 0.0 02/09/2014 0550    Studies/Results: No results found.  Medications: I have reviewed the patient's current medications.  Assessment/Plan:   1. 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  2. DVT Prophylaxis/Anticoagulation: Subcutaneous Lovenox. Monitor platelet counts any signs of bleeding  3. Pain Management: Tylenol as needed  4. H/o depression with anxiety/Mood: Stable at this time. LCSW to follow for evaluation. Continue Abilify 2.5 mg daily and Zoloft 150 mg daily. Will use ativan 0.5 mg tid prn for anxiety.  5. Neuropsych: This patient is capable of making decisions on his own behalf.  6. Diabetes mellitus: Hemoglobin A1c 8.1. Check blood sugars a.c. and at bedtime.  7. Hypertension: Will monitor BP with bid checks--continue Norvasc 10 mg daily, Bystolic 5mg  daily, Diovan 160 mg daily, hydrochlorothiazide 12.5 mg daily. Monitor with increased mobility  8. Hyperlipidemia: On Zocor  9. Constipation: Will schedule Miralax for bowel program.  10. OSA: continue CPAP to help with sleep hygiene and energy levels.  11. Spasticity: Add low dose baclofen. Splinting may be required as well.  12. Bradycardia. D/c BB  Cont Rx    Length of stay, days: 3  Sonda PrimesAlex Hall , MD 02/11/2014, 9:19 AM

## 2014-02-11 NOTE — Progress Notes (Signed)
Occupational Therapy Session Note  Patient Details  Name: Dionne AnoRoger W Northup MRN: [id number] Date of Birth: 11/19/1950  Today's Date: 02/11/2014 Time: 0830-0930 Time Calculation (min): 60 min  Skilled Therapeutic Interventions/Progress Updates: Morning session:   ADL with overall results as follows:  UB bathing= Min A to wash left UE and patient was able to return demonstration with Min A for placing cloth on his left upper leg and lean forward without losing balance to wash forearm and to wash left arm pit with his left hand.    UB dressing= required assist to don onto right arm and pull down shirt in back.  LB bathing and dressing required 2nd person to to maintain trunk control and static balance at sink while clinician completed pericleansing and pulling up pants.   Also required assist for donning shoes and socks     Therapy Documentation Precautions:  Precautions Precautions: Fall Precaution Comments: Rt hemiparesis  Restrictions Weight Bearing Restrictions: No   Pain: Pain Assessment Pain Assessment: No/denies pain  See FIM for current functional status  Therapy/Group: Individual Therapy  Bud Faceickett, Jerriyah Louis [facility] 02/11/2014, 4:14 PM

## 2014-02-11 NOTE — Progress Notes (Signed)
Physical Therapy Session Note  Patient Details  Name: Dakota Hall MRN: [id number] Date of Birth: 05/10/1951  Today's Date: 02/11/2014 Time: 0930-1030 Time Calculation (min): 60 min  Short Term Goals: Week 1:  PT Short Term Goal 1 (Week 1): Pt will roll L with supervision, 1 VC. PT Short Term Goal 2 (Week 1): Pt will transfer to L with max assist of 1 person. PT Short Term Goal 3 (Week 1): Pt will propel w/c x 150' iwth supervision. PT Short Term Goal 4 (Week 1): Pt will perform gait x 20' with assistance of 2 people.   Therapy Documentation Precautions:  Precautions Precautions: Fall Precaution Comments: Rt hemiparesis  Restrictions Weight Bearing Restrictions: No Pain: denies pain  Gait Training:(15') pre-gait training inside parallel bars stepping forward<->backward with L LE while R knee being blocked and encouraging R hip/gluteals muscle recruitment for stabilization. Therapeutic Activity:(30') Transfer training bed<->w/c via squat-pivot transfer Mod-assist initially and min-assist at end of session. Transfers sit<->stand multiple times inside parallel bars with patient pulling strongly with L UE to stand and min-assist from PT to block R knee from buckling.                               [facility] Therapeutic Exercise:(15') manually resisted and AAROM R LE in sitting; knee flexion, hip abduction, hip adduction and then in supine R leg press, hip adduction, hip abduction, heelslides.   Therapy/Group: Individual Therapy  Rex KrasLUNDGREN, Justn Quale J 02/11/2014, 10:23 AM

## 2014-02-11 NOTE — Progress Notes (Signed)
Occupational Therapy Session Note  Patient Details  Name: Dakota AnoRoger W Adorno MRN: [id number] Date of Birth: 07/28/1951  Today's Date: 02/11/2014 Time: 1300-1330 Time Calculation (min): 30 min   Second therapy Session:  Right UE and LE weight bearing EOB while crossing midline to place items in chair.  Patient c/o "7/10 sharp" pain in area he described as the lower aspect of his right IT band on his R LE only when he bore weight on the leg while reaching to his right.   The muscles in area felt very tight to this clinician.  The patient stated he did not need pain meds as this pain was only present when he boare weight through that high tone leg.                This clinican assisted patient to lying back down on his bed and positioning him on his left side with call bell, bed alarm and phone in place.  .Therapy Documentation Precautions:  Precautions Precautions: Fall Precaution Comments: Rt hemiparesis  Restrictions Weight Bearing Restrictions: No   Pain:7/10 in right inferior IT band when sitting and bearing weight to his right forward side.   Patient denied need for meds.  See FIM for current functional status  Therapy/Group: Individual Therapy  Bud Faceickett, Momo Braun [facility] 02/11/2014, 4:22 PM

## 2014-02-12 DIAGNOSIS — R4789 Other speech disturbances: Secondary | ICD-10-CM

## 2014-02-12 LAB — GLUCOSE, CAPILLARY
GLUCOSE-CAPILLARY: 114 mg/dL — AB (ref 70–99)
Glucose-Capillary: 149 mg/dL — ABNORMAL HIGH (ref 70–99)
Glucose-Capillary: 153 mg/dL — ABNORMAL HIGH (ref 70–99)
Glucose-Capillary: 147 mg/dL — ABNORMAL HIGH (ref 70–99)

## 2014-02-12 NOTE — Progress Notes (Signed)
Dakota Hall is a 63 y.o. male 09/17/1951 [id number]  Subjective: No new complaints. No new problems. Slept well. Feeling OK.  Objective: Vital signs in last 24 hours: Temp:  [97.5 F (36.4 C)-97.6 F (36.4 C)] 97.5 F (36.4 C) (03/29 0546) Pulse Rate:  [45-51] 45 (03/29 0546) Resp:  [16-18] 18 (03/29 0546) BP: (142-162)/(82) 162/82 mmHg (03/29 0546) SpO2:  [96 %-97 %] 96 % (03/29 0546) Weight change:  Last BM Date: 02/10/14  Intake/Output from previous day: 03/28 0701 - 03/29 0700 In: 580 [P.O.:580] Out: 725 [Urine:725] Last cbgs: CBG (last 3)   Recent Labs  02/11/14 1602 02/11/14 2112 02/12/14 0724  GLUCAP 105* 129* 114*     Physical Exam General: No apparent distress  Eating breakfast HEENT: not dry Lungs: Normal effort. Lungs clear to auscultation, no crackles or wheezes. Cardiovascular: Regular rate and rhythm, no edema Abdomen: S/NT/ND; BS(+) Musculoskeletal:  unchanged Neurological: No new neurological deficits Wounds: N/A    Skin: clear  Aging changes Mental state: Alert, oriented, cooperative     Lab Results: BMET    Component Value Date/Time   NA 144 02/09/2014 0550   K 3.7 02/09/2014 0550   CL 105 02/09/2014 0550   CO2 24 02/09/2014 0550   GLUCOSE 140* 02/09/2014 0550   BUN 28* 02/09/2014 0550   CREATININE 1.17 02/09/2014 0550   CALCIUM 9.6 02/09/2014 0550   GFRNONAA 65* 02/09/2014 0550   GFRAA 75* 02/09/2014 0550   CBC    Component Value Date/Time   WBC 8.6 02/09/2014 0550   RBC 5.01 02/09/2014 0550   HGB 16.0 02/09/2014 0550   HCT 43.6 02/09/2014 0550   PLT 194 02/09/2014 0550   MCV 87.0 02/09/2014 0550   MCH 31.9 02/09/2014 0550   MCHC 36.7* 02/09/2014 0550   RDW 13.0 02/09/2014 0550   LYMPHSABS 3.1 02/09/2014 0550   MONOABS 0.6 02/09/2014 0550   EOSABS 0.2 02/09/2014 0550   BASOSABS 0.0 02/09/2014 0550    Studies/Results: No results found.  Medications: I have reviewed the patient's current medications.  Assessment/Plan:   1. Left  basal ganglia infarct secondary to small vessel disease with right hemiparesis and dysarthria.  2. DVT Prophylaxis/Anticoagulation: Subcutaneous Lovenox. Monitor platelet counts any signs of bleeding  3. Pain Management: Tylenol as needed  4. H/o depression with anxiety/Mood: Stable at this time. LCSW to follow for evaluation. Continue Abilify 2.5 mg daily and Zoloft 150 mg daily. Will use ativan 0.5 mg tid prn for anxiety.  5. Neuropsych: This patient is capable of making decisions on his own behalf.  6. Diabetes mellitus: Hemoglobin A1c 8.1. Check blood sugars a.c. and at bedtime. Cont Rx 7. Hypertension: Will monitor BP with bid checks--continue Norvasc 10 mg daily, Bystolic 5mg  daily, Diovan 160 mg daily, hydrochlorothiazide 12.5 mg daily. Monitor with increased mobility  8. Hyperlipidemia: On Zocor  9. Constipation: Will schedule Miralax for bowel program.  10. OSA: continue CPAP to help with sleep hygiene and energy levels.  11. Spasticity: Add low dose baclofen. Splinting may be required as well.  12. Bradycardia. D/c BB      Length of stay, days: 4  Sonda PrimesAlex Shantella Blubaugh , MD 02/12/2014, 8:16 AM

## 2014-02-13 ENCOUNTER — Inpatient Hospital Stay (HOSPITAL_COMMUNITY): Payer: Managed Care, Other (non HMO) | Admitting: Occupational Therapy

## 2014-02-13 ENCOUNTER — Inpatient Hospital Stay (HOSPITAL_COMMUNITY): Payer: Managed Care, Other (non HMO)

## 2014-02-13 ENCOUNTER — Encounter (HOSPITAL_COMMUNITY): Payer: Managed Care, Other (non HMO)

## 2014-02-13 DIAGNOSIS — I69959 Hemiplegia and hemiparesis following unspecified cerebrovascular disease affecting unspecified side: Secondary | ICD-10-CM

## 2014-02-13 DIAGNOSIS — I69922 Dysarthria following unspecified cerebrovascular disease: Secondary | ICD-10-CM

## 2014-02-13 DIAGNOSIS — R1313 Dysphagia, pharyngeal phase: Secondary | ICD-10-CM

## 2014-02-13 DIAGNOSIS — E119 Type 2 diabetes mellitus without complications: Secondary | ICD-10-CM

## 2014-02-13 DIAGNOSIS — I6330 Cerebral infarction due to thrombosis of unspecified cerebral artery: Secondary | ICD-10-CM

## 2014-02-13 DIAGNOSIS — I69991 Dysphagia following unspecified cerebrovascular disease: Secondary | ICD-10-CM

## 2014-02-13 LAB — GLUCOSE, CAPILLARY
GLUCOSE-CAPILLARY: 175 mg/dL — AB (ref 70–99)
GLUCOSE-CAPILLARY: 143 mg/dL — AB (ref 70–99)
Glucose-Capillary: 110 mg/dL — ABNORMAL HIGH (ref 70–99)
Glucose-Capillary: 136 mg/dL — ABNORMAL HIGH (ref 70–99)
Glucose-Capillary: 170 mg/dL — ABNORMAL HIGH (ref 70–99)

## 2014-02-13 LAB — BASIC METABOLIC PANEL
BUN: 28 mg/dL — AB (ref 6–23)
CALCIUM: 9.6 mg/dL (ref 8.4–10.5)
CO2: 23 meq/L (ref 19–32)
CREATININE: 1.15 mg/dL (ref 0.50–1.35)
Chloride: 105 mEq/L (ref 96–112)
GFR calc Af Amer: 77 mL/min — ABNORMAL LOW (ref >90)
GFR, EST NON AFRICAN AMERICAN: 66 mL/min — AB (ref >90)
GLUCOSE: 147 mg/dL — AB (ref 70–99)
Potassium: 3.5 mEq/L — ABNORMAL LOW (ref 3.7–5.3)
SODIUM: 143 meq/L (ref 137–147)

## 2014-02-13 MED ORDER — BACLOFEN 5 MG HALF TABLET
5.0000 mg | ORAL_TABLET | Freq: Three times a day (TID) | ORAL | Status: DC
  Administered 2014-02-13 – 2014-02-14 (×5): 5 mg via ORAL
  Filled 2014-02-13 (×9): qty 1

## 2014-02-13 NOTE — Progress Notes (Signed)
Physical Therapy Note  Patient Details  Name: Dakota Hall MRN: [id number] Date of Birth: 10/23/1951 Today's Date: 02/13/2014 1100-1200, 60 min; 1435-1505, 30  individual therapy No pain reported; hypertonus noted L hamstrings and heel cord  Tx 1:  neuromuscular re-education:  In sitting for abdominal activation for trunk flexion (rather than pulling with L hand, a habit PTA) In standing for R LE stance stability in standing with assistance and wall bar on L In sitting for R hamstring and heel cord stretching by therapist NuStep with R hand splint focusing on RT knee alignment, at level 4, rated 12/13 on BORG scale x  Min- HR 67; BP 157/82, O2 sats 98% Bed mobility focusing on set-up of RUE and bil LEs  before rolling L with close supervision; L sidelying > sit using LUE  Transfers variable- to R with max assist w/c> NuStep; to R w/c> mat with mod assist, mat> w/c to L with min assist.  Pt performs best staying low in squat pivot position but does not do this spontaeously.  W/c propulsion using hemi technique with distant supervision, x 150'.  Tx 2:  neuromuscular re-education via forced use, demo, manual cues for stretching R hamstring and heel cord due to hypertonus, gait with railing on L x 12' with max assist for RLE stance and swing control, pillow case on R foot to reduce friction  W/c propulsion as above.  BP 151/83; HR 61 in sitting after gait in hall with railing  Denicia Pagliarulo 02/13/2014, 7:56 AM

## 2014-02-13 NOTE — Progress Notes (Signed)
Occupational Therapy Session Note  Patient Details  Name: Dakota Hall MRN: [id number] Date of Birth: 09/16/1951  Today's Date: 02/13/2014 Time: 1000-1100 Time Calculation (min): 60 min  Short Term Goals: Week 1:  OT Short Term Goal 1 (Week 1): Pt will be able to complete a squat pivot transfer to the toilet using grab bars with mod A x 1.  OT Short Term Goal 2 (Week 1): Pt will stand at sink with min A to allow him to complete LB dressing. OT Short Term Goal 3 (Week 1): Pt will don shirt with min A. OT Short Term Goal 4 (Week 1): Pt will don pants with min A. OT Short Term Goal 5 (Week 1): Pt will be able to cleanse himself on toilet with steady A.  Skilled Therapeutic Interventions/Progress Updates:      Pt seen for BADL retraining of toileting, bathing, and dressing with a focus on dynamic sitting balance, sit to stand, transfers, hemi dressing techniques. Pt did well with donning his shirt, but continues to have great difficulty with LB dressing due to increased RLE tone.  Pt then spent a great deal of time working on squat pivot transfers using GB to toilet and chair arm rail away from toilet with max A.  He repeated this 8x. Demonstrating transfer method to his NT.  Pt was then taken to gym for his PT session.  He did work on a/arom of  RUE to build on Biochemist, clinicaldeveloping chest/ shoulder strength. Pt is able to apply some resistance in pushing down and back and also towards his body.   Therapy Documentation Precautions:  Precautions Precautions: Fall Precaution Comments: Rt hemiparesis  Restrictions Weight Bearing Restrictions: No    Vital Signs: Therapy Vitals Pulse Rate: 67 BP: 157/82 mmHg Patient Position, if appropriate: Sitting (during exercise) Pain: Pain Assessment Pain Assessment: No/denies pain Pain Score: 0-No pain ADL: ADL ADL Comments: Refer to FIM  See FIM for current functional status  Therapy/Group: Individual Therapy  Iliana Hutt 02/13/2014, 12:09 PM

## 2014-02-13 NOTE — Progress Notes (Signed)
Subjective/Complaints: 63 y.o. right-handed male with history of CVA x2, DM type 2, OSA, who was admitted 02/03/2014 with acute onset right-sided weakness, right facial weakness with difficulty speaking and was noted to have severely elevated BP requiring labetalol and nicardipine. CCT negative for acute changes. CTA head/neck with mild irregularity of Left PCA question thalamostriate type ischemia, no large vessel stenosis. Patient treated with TPA and MRI of the head 2x2.5 cm acute infarct left basal ganglia radiating to white matter tracts. MRA of the head with no major occlusion or stenosis. Echocardiogram ejection fraction 60% without emboli. Carotid Dopplers with no ICA stenosis. 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 Swallow evaluation done and showed evidence of pharyngeal dysphagia with delay in swallow and was placed on dysphagia 3, thin liquids  Slept well. Denies pain. No sob, cough.  Review of Systems -  Objective: Vital Signs: Blood pressure 146/77, pulse 48, temperature 98 F (36.7 C), temperature source Oral, resp. rate 18, SpO2 96.00%. No results found. Results for orders placed during the hospital encounter of 02/08/14 (from the past 72 hour(s))  GLUCOSE, CAPILLARY     Status: Abnormal   Collection Time    02/10/14 11:55 AM      Result Value Ref Range   Glucose-Capillary 166 (*) 70 - 99 mg/dL  GLUCOSE, CAPILLARY     Status: Abnormal   Collection Time    02/10/14  4:43 PM      Result Value Ref Range   Glucose-Capillary 119 (*) 70 - 99 mg/dL   Comment 1 Notify RN    GLUCOSE, CAPILLARY     Status: Abnormal   Collection Time    02/10/14  8:41 PM      Result Value Ref Range   Glucose-Capillary 143 (*) 70 - 99 mg/dL   Comment 1 Notify RN    GLUCOSE, CAPILLARY     Status: Abnormal   Collection Time    02/11/14  7:44 AM      Result Value Ref Range   Glucose-Capillary 133 (*) 70 - 99 mg/dL  GLUCOSE, CAPILLARY      Status: Abnormal   Collection Time    02/11/14 12:57 PM      Result Value Ref Range   Glucose-Capillary 125 (*) 70 - 99 mg/dL  GLUCOSE, CAPILLARY     Status: Abnormal   Collection Time    02/11/14  4:02 PM      Result Value Ref Range   Glucose-Capillary 105 (*) 70 - 99 mg/dL  GLUCOSE, CAPILLARY     Status: Abnormal   Collection Time    02/11/14  9:12 PM      Result Value Ref Range   Glucose-Capillary 129 (*) 70 - 99 mg/dL  GLUCOSE, CAPILLARY     Status: Abnormal   Collection Time    02/12/14  7:24 AM      Result Value Ref Range   Glucose-Capillary 114 (*) 70 - 99 mg/dL  GLUCOSE, CAPILLARY     Status: Abnormal   Collection Time    02/12/14 11:32 AM      Result Value Ref Range   Glucose-Capillary 149 (*) 70 - 99 mg/dL  GLUCOSE, CAPILLARY     Status: Abnormal   Collection Time    02/12/14  4:22 PM      Result Value Ref Range   Glucose-Capillary 153 (*) 70 - 99 mg/dL  GLUCOSE, CAPILLARY     Status: Abnormal   Collection  Time    02/12/14  8:26 PM      Result Value Ref Range   Glucose-Capillary 147 (*) 70 - 99 mg/dL  GLUCOSE, CAPILLARY     Status: Abnormal   Collection Time    02/13/14  7:27 AM      Result Value Ref Range   Glucose-Capillary 143 (*) 70 - 99 mg/dL   Comment 1 Notify RN    BASIC METABOLIC PANEL     Status: Abnormal   Collection Time    02/13/14  7:33 AM      Result Value Ref Range   Sodium 143  137 - 147 mEq/L   Potassium 3.5 (*) 3.7 - 5.3 mEq/L   Chloride 105  96 - 112 mEq/L   CO2 23  19 - 32 mEq/L   Glucose, Bld 147 (*) 70 - 99 mg/dL   BUN 28 (*) 6 - 23 mg/dL   Creatinine, Ser 1.15  0.50 - 1.35 mg/dL   Calcium 9.6  8.4 - 10.5 mg/dL   GFR calc non Af Amer 66 (*) >90 mL/min   GFR calc Af Amer 77 (*) >90 mL/min   Comment: (NOTE)     The eGFR has been calculated using the CKD EPI equation.     This calculation has not been validated in all clinical situations.     eGFR's persistently <90 mL/min signify possible Chronic Kidney     Disease.      HEENT: normal Cardio: RRR Resp: CTA B/L and unlabored GI: BS positive and non distended Extremity:  Pulses positive and No Edema Skin:   Intact Neuro: Flat, Cranial Nerve II-XII normal, Normal Sensory, Abnormal Motor 0/5 in RUE, 2- to 2/5 R hip/knee ext synergy otherwise 0/5 and Tone:  Hypertonia 1 to 1+ Musc/Skel:  Normal Gen NAD   Assessment/Plan: 1. Functional deficits secondary to Left BG infarct with R spastic HP which require 3+ hours per day of interdisciplinary therapy in a comprehensive inpatient rehab setting. Physiatrist is providing close team supervision and 24 hour management of active medical problems listed below. Physiatrist and rehab team continue to assess barriers to discharge/monitor patient progress toward functional and medical goals. FIM: FIM - Bathing Bathing Steps Patient Completed: Chest;Right Arm;Abdomen;Right upper leg;Left upper leg;Left lower leg (including foot) Bathing: 3: Mod-Patient completes 5-7 48f10 parts or 50-74%  FIM - Upper Body Dressing/Undressing Upper body dressing/undressing steps patient completed: Thread/unthread left sleeve of pullover shirt/dress;Put head through opening of pull over shirt/dress Upper body dressing/undressing: 3: Mod-Patient completed 50-74% of tasks FIM - Lower Body Dressing/Undressing Lower body dressing/undressing steps patient completed: Thread/unthread left pants leg Lower body dressing/undressing: 2: Max-Patient completed 25-49% of tasks  FIM - Toileting Toileting: 0: Activity did not occur  FIM - TAir cabin crewTransfers: 0-Activity did not occur  FIM - BControl and instrumentation engineerDevices: Arm rests Bed/Chair Transfer: 1: Two helpers  FIM - Locomotion: Wheelchair Distance: 150 Locomotion: Wheelchair: 5: Travels 150 ft or more: maneuvers on rugs and over door sills with supervision, cueing or coaxing FIM - Locomotion: Ambulation Ambulation/Gait Assistance: 1: +2 Total  assist Locomotion: Ambulation: 0: Activity did not occur  Comprehension Comprehension Mode: Auditory Comprehension: 6-Follows complex conversation/direction: With extra time/assistive device  Expression Expression Mode: Verbal Expression: 4-Expresses basic 75 - 89% of the time/requires cueing 10 - 24% of the time. Needs helper to occlude trach/needs to repeat words.  Social Interaction Social Interaction: 6-Interacts appropriately with others with medication or extra time (anti-anxiety, antidepressant).  Problem Solving Problem Solving: 5-Solves complex 90% of the time/cues < 10% of the time  Memory Memory: 3-Recognizes or recalls 50 - 74% of the time/requires cueing 25 - 49% of the time   Medical Problem List and Plan:  1. 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  2. DVT Prophylaxis/Anticoagulation: Subcutaneous Lovenox. Monitor platelet counts any signs of bleeding  3. Pain Management: Tylenol as needed  4. H/o depression with anxiety/Mood: Stable at this time. LCSW to follow for evaluation. Continue Abilify 2.5 mg daily and Zoloft 150 mg daily. Will use ativan 0.5 mg tid prn for anxiety.  5. Neuropsych: This patient is capable of making decisions on his own behalf.  6. Diabetes mellitus: Hemoglobin A1c 8.1. Check blood sugars a.c. and at bedtime.  7. Hypertension: Will monitor BP with bid checks--continue Norvasc 10 mg daily, Bystolic 63m daily, Diovan 160 mg daily, hydrochlorothiazide 12.5 mg daily. Monitor with increased mobility  8. Hyperlipidemia: On Zocor  9. Constipation: Will schedule Miralax for bowel program.  10. OSA: continue CPAP to help with sleep hygiene and energy levels.  11. Spasticity: Added low dose baclofen. Consider further titration 12.  Bradycardia D/c BB LOS (Days) 5 A FACE TO FACE EVALUATION WAS PERFORMED  SWARTZ,ZACHARY T 02/13/2014, 9:41 AM

## 2014-02-13 NOTE — Progress Notes (Signed)
Speech Language Pathology Daily Session Note  Patient Details  Name: Dakota Hall Hainer MRN: [id number] Date of Birth: 12/03/1950  Today's Date: 02/13/2014 Time: [id number] Time Calculation (min): 45 min  Short Term Goals: Week 1: SLP Short Term Goal 1 (Week 1): Pt will demonstrate adequate mastication and oral clearance with trials of regular textures with Min cues SLP Short Term Goal 2 (Week 1): Pt will utilize speech intelligibility strategies at the sentence level with Mod cues SLP Short Term Goal 3 (Week 1): Pt will recall 4 out of 4 speech intelligibility strategies with Min cues SLP Short Term Goal 4 (Week 1): Pt will demonstrate complex problem solving during functional task with Supervision level cues SLP Short Term Goal 5 (Week 1): Pt will utilize internal/external memory aids to facilitate working memory during functional tasks with Supervision level cues  Skilled Therapeutic Interventions: Treatment focused on speech and cognitive goals. SLP facilitated session with Min A to create a list of functional phrases to use in practice. Pt recalled 1 out of 4 speech intelligibility strategy, requiring Max cues to recall the remaining three. He utilized strategies with Min cues at the sentence level with his personalized list. He required Mod cues for use of strategies throughout tongue twister exercises. Pt noted to exhibit increased difficulty with articulation of /s/. Continue plan of care.   FIM:  Comprehension Comprehension Mode: Auditory Comprehension: 6-Follows complex conversation/direction: With extra time/assistive device Expression Expression Mode: Verbal Expression: 4-Expresses basic 75 - 89% of the time/requires cueing 10 - 24% of the time. Needs helper to occlude trach/needs to repeat words. Social Interaction Social Interaction: 6-Interacts appropriately with others with medication or extra time (anti-anxiety, antidepressant). Problem Solving Problem Solving: 5-Solves complex  90% of the time/cues < 10% of the time Memory Memory: 3-Recognizes or recalls 50 - 74% of the time/requires cueing 25 - 49% of the time  Pain Pain Assessment Pain Assessment: No/denies pain  Therapy/Group: Individual Therapy   Maxcine HamLaura Paiewonsky, M.A. CCC-SLP [phone]  Maxcine Hamaiewonsky, Ariauna Farabee 02/13/2014, 9:43 AM

## 2014-02-14 ENCOUNTER — Inpatient Hospital Stay (HOSPITAL_COMMUNITY): Payer: Managed Care, Other (non HMO) | Admitting: Occupational Therapy

## 2014-02-14 ENCOUNTER — Inpatient Hospital Stay (HOSPITAL_COMMUNITY): Payer: Managed Care, Other (non HMO)

## 2014-02-14 ENCOUNTER — Inpatient Hospital Stay (HOSPITAL_COMMUNITY): Payer: Managed Care, Other (non HMO) | Admitting: *Deleted

## 2014-02-14 ENCOUNTER — Other Ambulatory Visit: Payer: Managed Care, Other (non HMO)

## 2014-02-14 DIAGNOSIS — E119 Type 2 diabetes mellitus without complications: Secondary | ICD-10-CM

## 2014-02-14 DIAGNOSIS — I6330 Cerebral infarction due to thrombosis of unspecified cerebral artery: Secondary | ICD-10-CM

## 2014-02-14 DIAGNOSIS — R1313 Dysphagia, pharyngeal phase: Secondary | ICD-10-CM

## 2014-02-14 DIAGNOSIS — I69922 Dysarthria following unspecified cerebrovascular disease: Secondary | ICD-10-CM

## 2014-02-14 DIAGNOSIS — I69959 Hemiplegia and hemiparesis following unspecified cerebrovascular disease affecting unspecified side: Secondary | ICD-10-CM

## 2014-02-14 DIAGNOSIS — I69991 Dysphagia following unspecified cerebrovascular disease: Secondary | ICD-10-CM

## 2014-02-14 LAB — URINALYSIS, ROUTINE W REFLEX MICROSCOPIC
Bilirubin Urine: NEGATIVE
Glucose, UA: 500 mg/dL — AB
HGB URINE DIPSTICK: NEGATIVE
Ketones, ur: NEGATIVE mg/dL
Leukocytes, UA: NEGATIVE
NITRITE: NEGATIVE
PROTEIN: NEGATIVE mg/dL
Specific Gravity, Urine: 1.023 (ref 1.005–1.030)
Urobilinogen, UA: 1 mg/dL (ref 0.0–1.0)
pH: 5 (ref 5.0–8.0)

## 2014-02-14 LAB — GLUCOSE, CAPILLARY
Glucose-Capillary: 131 mg/dL — ABNORMAL HIGH (ref 70–99)
Glucose-Capillary: 123 mg/dL — ABNORMAL HIGH (ref 70–99)
Glucose-Capillary: 121 mg/dL — ABNORMAL HIGH (ref 70–99)
Glucose-Capillary: 167 mg/dL — ABNORMAL HIGH (ref 70–99)

## 2014-02-14 NOTE — Progress Notes (Signed)
Occupational Therapy Session Note  Patient Details  Name: Dakota Hall MRN: [id number] Date of Birth: 12/29/1950  Today's Date: 02/14/2014 Time: 1000-1100 Time Calculation (min): 60 min  Short Term Goals: Week 1:  OT Short Term Goal 1 (Week 1): Pt will be able to complete a squat pivot transfer to the toilet using grab bars with mod A x 1.  OT Short Term Goal 2 (Week 1): Pt will stand at sink with min A to allow him to complete LB dressing. OT Short Term Goal 3 (Week 1): Pt will don shirt with min A. OT Short Term Goal 4 (Week 1): Pt will don pants with min A. OT Short Term Goal 5 (Week 1): Pt will be able to cleanse himself on toilet with steady A.  Skilled Therapeutic Interventions/Progress Updates:  Patient resting in bed upon arrival.  Engaged in self care retraining to include sponge bath at sink, dress and groom.  Focused session on activity tolerance, safe bed>w/c transfer, sit><stands, dynamic sitting and standing balance, lateral weight shifts in standing and squatting.  Issued shoe buttons to improve independence with LB dressing.  Therapy Documentation Precautions:  Precautions Precautions: Fall Precaution Comments: Rt hemiparesis  Restrictions Weight Bearing Restrictions: No Vital Signs: HR ~halfway through session was 64. Pain: Denies pain ADL: See FIM for current functional status  Therapy/Group: Individual Therapy  Dakota Hall 02/14/2014, 11:42 AM

## 2014-02-14 NOTE — Progress Notes (Signed)
Pt states he can place himself on CPAP when ready. Machine moved within pt's reach. RT will continue to monitor.

## 2014-02-14 NOTE — Progress Notes (Addendum)
Physical Therapy Session Note  Patient Details  Name: Dakota Hall MRN: [id number] Date of Birth: 12/06/1950  Today's Date: 02/14/2014 Time: 1105-1200 and 13:00-13:30  Time Calculation (min): 55 min and 30min   Short Term Goals: Week 1:  PT Short Term Goal 1 (Week 1): Pt will roll L with supervision, 1 VC. PT Short Term Goal 2 (Week 1): Pt will transfer to L with max assist of 1 person. PT Short Term Goal 3 (Week 1): Pt will propel w/c x 150' iwth supervision. PT Short Term Goal 4 (Week 1): Pt will perform gait x 20' with assistance of 2 people.  Skilled Therapeutic Interventions/Progress Updates:  1:2 Tx focused on transfer training, stretching for tone management; NMR in standing frame and at hall rail for increased R proprioception and neuromotor activation. [facility], encouraged to propel self to gym for therapies - 2x150' with S and hemi-technique. Noted pt with some R ankle inversion [facility]; encouraged him to ask for assist positioning prn.   Performed serial squat-pivot transfers W/L WC<>mat x4 with consistent Mod A and cues for set-up and controlling descent. Pt with good carry over between trials.   Performed passive R HS and heel cord stretching 2x1061min each for tone reduction for increased function with mobility.   Performed static standing at stander x715min. Performed - static stance with weight shifted on R LE during reaching/writing task - terminal hip ext x10  Performed static and dynamic standing at hall rail on L. Pt needing up to Max A for balance during lateral weight shifting and forward/retro stepping with RLE. Pt with difficulty maintaining knee and hip ext, but was able to advance RLE forward with ACE wrap for ankle DF. Pt too fatigued to attempt walking or further pre-gait tasks at this time.   2:2 Tx focused on NMR in // bars for RLE sustained activation as well as standing balance.  Pt propelled self to/from gym.   Mirror used for [employer] Airwaysvisual feeback  in // bars. Therapist assist provided for RUE management as well as R knee to limit ext and prevent buckling. Therapist providing manual facilitation for upright trunk and midline oriented trunk rotation. Pt performed sit<>stand tranfers with min/mod A with difficulty controlling descent.   Once in standing, pt performed various tasks to increase R knee control and R hip ext including each of the following:  - lateral weight shifts with/without sustained stance on R - reaching across midline/placing objects with LUE. Pt had several significant LOB, once with uncontrolled descent.  - manually resisted R hip ext with/without sheet behind hips to assist full ext - stepping forward/back with RLE - step-taps to 2" step with RLE  Pt quite fatigued by end of tx      Therapy Documentation Precautions:  Precautions Precautions: Fall Precaution Comments: Rt hemiparesis  Restrictions Weight Bearing Restrictions: No    Pain: Pain Assessment Pain Assessment: No/denies pain    Locomotion : Wheelchair Mobility Distance: 150   See FIM for current functional status  Therapy/Group: Individual Therapy Clydene Lamingole Chelsey Redondo, PT, DPT   02/14/2014, 12:17 PM

## 2014-02-14 NOTE — Progress Notes (Signed)
Speech Language Pathology Daily Session Note  Patient Details  Name: Dakota Hall MRN: [id number] Date of Birth: 04/12/1951  Today's Date: 02/14/2014 Time: [id number] Time Calculation (min): 42 min  Short Term Goals: Week 1: SLP Short Term Goal 1 (Week 1): Pt will demonstrate adequate mastication and oral clearance with trials of regular textures with Min cues SLP Short Term Goal 2 (Week 1): Pt will utilize speech intelligibility strategies at the sentence level with Mod cues SLP Short Term Goal 3 (Week 1): Pt will recall 4 out of 4 speech intelligibility strategies with Min cues SLP Short Term Goal 4 (Week 1): Pt will demonstrate complex problem solving during functional task with Supervision level cues SLP Short Term Goal 5 (Week 1): Pt will utilize internal/external memory aids to facilitate working memory during functional tasks with Supervision level cues  Skilled Therapeutic Interventions: Treatment focused on swallowing, speech, and cognitive goals. SLP facilitated session with observation of PO trials consisting of regular textures and thin liquids via cup sips. No overt s/s of aspiration were observed, and pt utilized a liquid wash to facilitate adequate oral clearance without cues. Pt required Min cues for recall of speech intelligibility strategies, which he used with functional phrases with supervision level cueing. SLP facilitated session with education regarding tongue placement for /s/, with pt reporting that while he did have difficulty with this phoneme prior to CVA, it is acutely worsened. Pt required Mod cues for lingual placement to improve quality, with quality notably improved in the word-final position. Pt required Mod cues to utilize speech intelligibility strategies while reading at the sentence level. Continue plan of care.   FIM:  Comprehension Comprehension Mode: Auditory Comprehension: 6-Follows complex conversation/direction: With extra time/assistive  device Expression Expression Mode: Verbal Expression: 4-Expresses basic 75 - 89% of the time/requires cueing 10 - 24% of the time. Needs helper to occlude trach/needs to repeat words. Social Interaction Social Interaction: 6-Interacts appropriately with others with medication or extra time (anti-anxiety, antidepressant). Problem Solving Problem Solving: 5-Solves complex 90% of the time/cues < 10% of the time Memory Memory: 4-Recognizes or recalls 75 - 89% of the time/requires cueing 10 - 24% of the time FIM - Eating Eating Activity: 5: Supervision/cues  Pain Pain Assessment Pain Assessment: No/denies pain  Therapy/Group: Individual Therapy   Maxcine HamLaura Paiewonsky, M.A. CCC-SLP [phone]  Maxcine Hamaiewonsky, Coleman Kalas 02/14/2014, 9:44 AM

## 2014-02-14 NOTE — Progress Notes (Signed)
Subjective/Complaints: 63 y.o. right-handed male with history of CVA x2, DM type 2, OSA, who was admitted 02/03/2014 with acute onset right-sided weakness, right facial weakness with difficulty speaking and was noted to have severely elevated BP requiring labetalol and nicardipine. CCT negative for acute changes. CTA head/neck with mild irregularity of Left PCA question thalamostriate type ischemia, no large vessel stenosis. Patient treated with TPA and MRI of the head 2x2.5 cm acute infarct left basal ganglia radiating to white matter tracts. MRA of the head with no major occlusion or stenosis. Echocardiogram ejection fraction 60% without emboli. Carotid Dopplers with no ICA stenosis. 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 Swallow evaluation done and showed evidence of pharyngeal dysphagia with delay in swallow and was placed on dysphagia 3, thin liquids  No new issues. Slept well. Using cpap.  Review of Systems -  Objective: Vital Signs: Blood pressure 127/71, pulse 51, temperature 98.3 F (36.8 C), temperature source Oral, resp. rate 18, SpO2 95.00%. No results found. Results for orders placed during the hospital encounter of 02/08/14 (from the past 72 hour(s))  GLUCOSE, CAPILLARY     Status: Abnormal   Collection Time    02/11/14 12:57 PM      Result Value Ref Range   Glucose-Capillary 125 (*) 70 - 99 mg/dL  GLUCOSE, CAPILLARY     Status: Abnormal   Collection Time    02/11/14  4:02 PM      Result Value Ref Range   Glucose-Capillary 105 (*) 70 - 99 mg/dL  GLUCOSE, CAPILLARY     Status: Abnormal   Collection Time    02/11/14  9:12 PM      Result Value Ref Range   Glucose-Capillary 129 (*) 70 - 99 mg/dL  GLUCOSE, CAPILLARY     Status: Abnormal   Collection Time    02/12/14  7:24 AM      Result Value Ref Range   Glucose-Capillary 114 (*) 70 - 99 mg/dL  GLUCOSE, CAPILLARY     Status: Abnormal   Collection Time    02/12/14  11:32 AM      Result Value Ref Range   Glucose-Capillary 149 (*) 70 - 99 mg/dL  GLUCOSE, CAPILLARY     Status: Abnormal   Collection Time    02/12/14  4:22 PM      Result Value Ref Range   Glucose-Capillary 153 (*) 70 - 99 mg/dL  GLUCOSE, CAPILLARY     Status: Abnormal   Collection Time    02/12/14  8:26 PM      Result Value Ref Range   Glucose-Capillary 147 (*) 70 - 99 mg/dL  GLUCOSE, CAPILLARY     Status: Abnormal   Collection Time    02/13/14  7:27 AM      Result Value Ref Range   Glucose-Capillary 143 (*) 70 - 99 mg/dL   Comment 1 Notify RN    BASIC METABOLIC PANEL     Status: Abnormal   Collection Time    02/13/14  7:33 AM      Result Value Ref Range   Sodium 143  137 - 147 mEq/L   Potassium 3.5 (*) 3.7 - 5.3 mEq/L   Chloride 105  96 - 112 mEq/L   CO2 23  19 - 32 mEq/L   Glucose, Bld 147 (*) 70 - 99 mg/dL   BUN 28 (*) 6 - 23 mg/dL   Creatinine, Ser 1.15  0.50 - 1.35 mg/dL  Calcium 9.6  8.4 - 10.5 mg/dL   GFR calc non Af Amer 66 (*) >90 mL/min   GFR calc Af Amer 77 (*) >90 mL/min   Comment: (NOTE)     The eGFR has been calculated using the CKD EPI equation.     This calculation has not been validated in all clinical situations.     eGFR's persistently <90 mL/min signify possible Chronic Kidney     Disease.  GLUCOSE, CAPILLARY     Status: Abnormal   Collection Time    02/13/14 12:06 PM      Result Value Ref Range   Glucose-Capillary 110 (*) 70 - 99 mg/dL   Comment 1 Notify RN    GLUCOSE, CAPILLARY     Status: Abnormal   Collection Time    02/13/14  4:44 PM      Result Value Ref Range   Glucose-Capillary 136 (*) 70 - 99 mg/dL   Comment 1 Notify RN    GLUCOSE, CAPILLARY     Status: Abnormal   Collection Time    02/13/14  8:34 PM      Result Value Ref Range   Glucose-Capillary 170 (*) 70 - 99 mg/dL  GLUCOSE, CAPILLARY     Status: Abnormal   Collection Time    02/14/14  7:19 AM      Result Value Ref Range   Glucose-Capillary 131 (*) 70 - 99 mg/dL      HEENT: normal Cardio: RRR Resp: CTA B/L and unlabored GI: BS positive and non distended Extremity:  Pulses positive and No Edema Skin:   Intact Neuro: Flat, Cranial Nerve II-XII normal, Normal Sensory, Abnormal Motor 0/5 in RUE, 2- to 2/5 R hip/knee ext synergy otherwise 0/5 and Tone:  Hypertonia 1 to 1+ Musc/Skel:  Normal Gen NAD   Assessment/Plan: 1. Functional deficits secondary to Left BG infarct with R spastic HP which require 3+ hours per day of interdisciplinary therapy in a comprehensive inpatient rehab setting. Physiatrist is providing close team supervision and 24 hour management of active medical problems listed below. Physiatrist and rehab team continue to assess barriers to discharge/monitor patient progress toward functional and medical goals. FIM: FIM - Bathing Bathing Steps Patient Completed: Chest;Right Arm;Abdomen;Right upper leg;Left upper leg;Left lower leg (including foot) Bathing: 3: Mod-Patient completes 5-7 32f10 parts or 50-74%  FIM - Upper Body Dressing/Undressing Upper body dressing/undressing steps patient completed: Thread/unthread left sleeve of pullover shirt/dress;Put head through opening of pull over shirt/dress;Pull shirt over trunk;Thread/unthread right sleeve of pullover shirt/dresss Upper body dressing/undressing: 5: Supervision: Safety issues/verbal cues FIM - Lower Body Dressing/Undressing Lower body dressing/undressing steps patient completed: Thread/unthread left pants leg Lower body dressing/undressing: 2: Max-Patient completed 25-49% of tasks  FIM - Toileting Toileting: 0: Activity did not occur  FIM - TRadio producerDevices: Grab bars Toilet Transfers: 2-From toilet/BSC: Max A (lift and lower assist);2-To toilet/BSC: Max A (lift and lower assist)  FIM - BEngineer, siteAssistive Devices: Arm rests Bed/Chair Transfer: 4: Supine > Sit: Min A (steadying Pt. > 75%/lift 1 leg);2: Bed >  Chair or W/C: Max A (lift and lower assist);3: Chair or W/C > Bed: Mod A (lift or lower assist)  FIM - Locomotion: Wheelchair Distance: 150 Locomotion: Wheelchair: 5: Travels 150 ft or more: maneuvers on rugs and over door sills with supervision, cueing or coaxing FIM - Locomotion: Ambulation Locomotion: Ambulation Assistive Devices: Orthosis;Other (comment) (railing in hall, L hand) Ambulation/Gait Assistance: 2: Max assist Locomotion: Ambulation:  1: Travels less than 50 ft with maximal assistance (Pt: 25 - 49%)  Comprehension Comprehension Mode: Auditory Comprehension: 6-Follows complex conversation/direction: With extra time/assistive device  Expression Expression Mode: Verbal Expression: 4-Expresses basic 75 - 89% of the time/requires cueing 10 - 24% of the time. Needs helper to occlude trach/needs to repeat words.  Social Interaction Social Interaction: 6-Interacts appropriately with others with medication or extra time (anti-anxiety, antidepressant).  Problem Solving Problem Solving: 5-Solves complex 90% of the time/cues < 10% of the time  Memory Memory: 3-Recognizes or recalls 50 - 74% of the time/requires cueing 25 - 49% of the time   Medical Problem List and Plan:  1. 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  2. DVT Prophylaxis/Anticoagulation: Subcutaneous Lovenox. Monitor platelet counts any signs of bleeding  3. Pain Management: Tylenol as needed  4. H/o depression with anxiety/Mood: Stable at this time. LCSW to follow for evaluation. Continue Abilify 2.5 mg daily and Zoloft 150 mg daily. Will use ativan 0.5 mg tid prn for anxiety.  5. Neuropsych: This patient is capable of making decisions on his own behalf.  6. Diabetes mellitus: Hemoglobin A1c 8.1. Check blood sugars a.c. and at bedtime.  7. Hypertension: Will monitor BP with bid checks--continue Norvasc 10 mg daily, Bystolic 52m daily, Diovan 160 mg daily,  hydrochlorothiazide 12.5 mg daily. Monitor with increased mobility  8. Hyperlipidemia: On Zocor  9. Constipation: Will schedule Miralax for bowel program.  10. OSA: continue CPAP to help with sleep hygiene and energy levels.  11. Spasticity: titrate baclofen up as tolerated andneeded 12.  Bradycardia D/c BB 13. Malodorous urine: check sample LOS (Days) 6 A FACE TO FACE EVALUATION WAS PERFORMED  Dollie Bressi T 02/14/2014, 9:21 AM

## 2014-02-15 ENCOUNTER — Inpatient Hospital Stay (HOSPITAL_COMMUNITY): Payer: Managed Care, Other (non HMO)

## 2014-02-15 ENCOUNTER — Inpatient Hospital Stay (HOSPITAL_COMMUNITY): Payer: Managed Care, Other (non HMO) | Admitting: Occupational Therapy

## 2014-02-15 DIAGNOSIS — I69922 Dysarthria following unspecified cerebrovascular disease: Secondary | ICD-10-CM

## 2014-02-15 DIAGNOSIS — I69959 Hemiplegia and hemiparesis following unspecified cerebrovascular disease affecting unspecified side: Secondary | ICD-10-CM

## 2014-02-15 DIAGNOSIS — I2699 Other pulmonary embolism without acute cor pulmonale: Secondary | ICD-10-CM

## 2014-02-15 DIAGNOSIS — E119 Type 2 diabetes mellitus without complications: Secondary | ICD-10-CM

## 2014-02-15 DIAGNOSIS — I6330 Cerebral infarction due to thrombosis of unspecified cerebral artery: Secondary | ICD-10-CM

## 2014-02-15 DIAGNOSIS — R1313 Dysphagia, pharyngeal phase: Secondary | ICD-10-CM

## 2014-02-15 DIAGNOSIS — I69991 Dysphagia following unspecified cerebrovascular disease: Secondary | ICD-10-CM

## 2014-02-15 HISTORY — DX: Other pulmonary embolism without acute cor pulmonale: I26.99

## 2014-02-15 LAB — URINE CULTURE
Colony Count: NO GROWTH
Culture: NO GROWTH

## 2014-02-15 LAB — GLUCOSE, CAPILLARY
Glucose-Capillary: 139 mg/dL — ABNORMAL HIGH (ref 70–99)
Glucose-Capillary: 117 mg/dL — ABNORMAL HIGH (ref 70–99)
Glucose-Capillary: 110 mg/dL — ABNORMAL HIGH (ref 70–99)
Glucose-Capillary: 149 mg/dL — ABNORMAL HIGH (ref 70–99)

## 2014-02-15 MED ORDER — BACLOFEN 10 MG PO TABS
10.0000 mg | ORAL_TABLET | Freq: Three times a day (TID) | ORAL | Status: DC
  Administered 2014-02-15 – 2014-02-22 (×21): 10 mg via ORAL
  Filled 2014-02-15 (×24): qty 1

## 2014-02-15 NOTE — Consult Note (Signed)
  INITIAL DIAGNOSTIC EVALUATION - CONFIDENTIAL  Inpatient Rehabilitation   MEDICAL NECESSITY:  [facility]ak[facility] SitesRoger [facility] an initial diagnostic evaluation owing to the patient's diagnosis of stroke.   According to medical records, 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 He was reportedly admitted on 02/03/2014 with acute onset right-sided weakness, right facial weakness with difficulty speaking. His BP was severely elevated. CCT was negative for acute changes. Patient treated with TPA and MRI of the head revealed a 2x2.5 cm acute infarct in the left basal ganglia radiating to white matter tracts.  During today's visit, [name] denied experiencing any major cognitive changes following any of his strokes. He said he experienced minimal changes after the first two but this most recent one was much worse with regard to his physical functioning. He mentioned being essentially paralyzed on the right side and his speech is slurred. He described his current mood as "overwhelmed" and "depressed." He has a significant history of treatment for depression and anxiety. He is followed by a psychiatrist and counselor.   [name] purportedly has a good social support system that includes his family and friends. He feels that he is making strides in therapy. He mentioned that the staff has been treating him very well and he is thankful.   PROCEDURES ADMINISTERED: [1 unit [id number] on 02/13/14] Diagnostic clinical interview  Review of available records  Emotional & Behavioral Evaluation: [name] was appropriately dressed for season and situation, and he appeared tidy and well-groomed. Normal posture was noted. He was friendly and rapport was adequately  established. His speech was mildly slurred but he was able to express ideas effectively. His affect was appropriately modulated, though he seemed depressed. In fact, he became tearful and cried on several occasions. Attention and motivation were good.   From an emotional standpoint, [name] admitted to suffering from a moderate to severe degree of depression in reaction to his present medical situation. He denied having any issues adjusting to his hospital stay. Suicidal/homicidal ideation, plan or intent was denied. No manic or hypomanic episodes were reported. The patient denied ever experiencing any auditory/visual hallucinations. No major behavioral or personality changes were endorsed.    Overall, [name] is in a great deal of distress in light of his present medical situation. He has a history of major depression and anxiety. I recommended that he and I schedule formal supportive psychotherapy sessions while he is with us. He agreed. I also encouraged him to educate his psychiatric providers about his present medical status. After discharge, he should undergo neuropsychological testing to determine his ability to return to work.    RECOMMENDATIONS  Recommendations for treatment team:    Schedule a follow-up coping session with me next Wednesday if he is still admitted.   Continued follow-up regarding his psychopharmacological medication(s).    I encouraged him to contact his psychiatrist and counselor to make them aware of his present situation so that they can be an additional source of support. Social work may be able to assist with this endeavor.    Complete a comprehensive neuropsychological evaluation upon discharge. This can be accomplished via me at [phone].      Debbe MountsAdam T. Harleigh Civello, Psy.D.  Clinical Neuropsychologist

## 2014-02-15 NOTE — Progress Notes (Signed)
Occupational Therapy Session Note  Patient Details  Name: Dakota Hall MRN: [id number] Date of Birth: 11/18/1950  Today's Date: 02/15/2014 Time: 0800-0905 Time Calculation (min): 65 min  Short Term Goals: Week 1:  OT Short Term Goal 1 (Week 1): Pt will be able to complete a squat pivot transfer to the toilet using grab bars with mod A x 1.  OT Short Term Goal 2 (Week 1): Pt will stand at sink with min A to allow him to complete LB dressing. OT Short Term Goal 3 (Week 1): Pt will don shirt with min A. OT Short Term Goal 4 (Week 1): Pt will don pants with min A. OT Short Term Goal 5 (Week 1): Pt will be able to cleanse himself on toilet with steady A.  Skilled Therapeutic Interventions/Progress Updates:  Patient resting in bed upon arrival and finishing his breakfast.  Engaged in self care retraining to include toilet and shower transfers, toileting, bathe dress and groom tasks.  Focused session on techniques/steps for increased success with sit to stand to include scoot to the EOC, proper right foot placement to prepare it for optimal weightbearing conditions through RLE, lateral weight shifts to encourage equal and sustained weight bearing through BLEs, dynamic RLE activation in stand and in squat.  Right UE & LE tone notably decreased with heaving and dynamic weight bearing through RLE.  Patient stood in shower with min-mod assist when washing his bottom while allowing for most of his weight through his LLE as well as occasional use of grab bar for safety.  Ran out of time, therefore sock ads shoes were donned by OT.  Therapy Documentation Precautions:  Precautions Precautions: Fall Precaution Comments: Rt hemiparesis  Restrictions Weight Bearing Restrictions: No Vital Signs: Therapy Vitals Pulse Rate: 65 (during BADL with OT) Pain: Denies pain ADL: See FIM for current functional status  Therapy/Group: Individual Therapy  Devesh Monforte 02/15/2014, 9:35 AM

## 2014-02-15 NOTE — Patient Care Conference (Signed)
Inpatient RehabilitationTeam Conference and Plan of Care Update Date: 02/15/2014   Time: 10;20 AM    Patient Name: Dakota Hall      Medical Record Number: [id number]  Date of Birth: 01/17/1951 Sex: Male         Room/Bed: 4W22C/4W22C-01 Payor Info: Payor: CIG[employer] / Plan: CIGNA MANAGED / Product Type: *No Product type* /    Admitting Diagnosis: L CVA  Admit Date/Time:  02/08/2014  5:19 PM Admission Comments: No comment available   Primary Diagnosis:  <principal problem not specified> Principal Problem: <principal problem not specified>  Patient Active Problem List   Diagnosis Date Noted  . CVA (cerebral infarction) 02/08/2014  . Depression with anxiety   . 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 Obesity   . Stroke 02/03/2014  . Unspecified cerebral artery occlusion with cerebral infarction 07/20/2013  . Stroke, small vessel 05/18/2012  . Slurred speech 05/15/2012  . HTN (hypertension) 05/15/2012  . Noncompliance with medication regimen 05/15/2012  . DM (diabetes mellitus) 05/15/2012  . Vision changes 05/15/2012    Expected Discharge Date: Expected Discharge Date: 03/07/14  Team Members Present: Physician leading conference: Dr. Faith RogueZachary Swartz Social Worker Present: Dossie DerBecky Russell Quinney, LCSW Nurse Present: Tennis MustLuz Rosero, RN PT Present: Wanda Plumparoline Cook, PT;Blair Hobble, PT OT Present: Scherrie NovemberKayla Perkinson, Loistine ChanceT;Karen Pulaski, OT SLP Present: Maxcine HamLaura Paiewonsky, SLP PPS Coordinator present : Tora DuckMarie Noel, RN, CRRN     Current Status/Progress Goal Weekly Team Focus  Medical   left MCA infarct, right spastic HP, tone is an issue. working on dm and bp  maximize functional movement  tone control, appetite   Bowel/Bladder   Continent to bladder and bowel  To continue continent to boweel and bladder      Swallow/Nutrition/ Hydration   Dys 3 textures and thin liquids, trials of regular textures  Mod I with least restrictive PO  trials of advanced textures   ADL's   mod A bathing, cues UB dressing, max  LB dressing, max transfers due to hypertonicity in RLE  supervision overall  ADL retraining, functional mobility, RUE neuro  re-ed, pt/family education   Mobility   Min/mod A bed mobiltiy; mod/max A transfers; max A gait x12' with rail;   Supervision all transfers and gait; Mod I WC  Stretching/tone management; NMR; transfers; pre-gait and gait training   Communication   Mod cues for use of speech strategies  Mod I at sentence level  increase use of speech strategies   Safety/Cognition/ Behavioral Observations  supervision for sequencing, Min for recall  Mod I  use of memory aids, complex problem solving   Pain   Denies any pain  Keep pain levels less than 3,on scale 1 to 10      Skin   Skin is dry and intact  To keep skin with out any pressure sores         *See Care Plan and progress notes for long and short-term goals.  Barriers to Discharge: spastic right HP    Possible Resolutions to Barriers:  rom, meds, adaptive equipmetn    Discharge Planning/Teaching Needs:  Wife hopeful to take a FMLA to provide care to pt. Neuro-psych to follow to assist with coping       Team Discussion:  Making good progress, needs assist with coping-neuro psych seeing.  UTI checking on.  Tone limits his progress.  Revisions to Treatment Plan:  None   Continued Need for Acute Rehabilitation Level of Care: The patient requires daily medical management by a physician with  specialized training in physical medicine and rehabilitation for the following conditions: Daily direction of a multidisciplinary physical rehabilitation program to ensure safe treatment while eliciting the highest outcome that is of practical value to the patient.: Yes Daily medical management of patient stability for increased activity during participation in an intensive rehabilitation regime.: Yes Daily analysis of laboratory values and/or radiology reports with any subsequent need for medication adjustment of medical intervention  for : Neurological problems;Post surgical problems  Glessie Eustice, Lemar Livings 02/15/2014, 2:43 PM

## 2014-02-15 NOTE — Progress Notes (Signed)
Speech Language Pathology Daily Session Note  Patient Details  Name: Dakota Hall MRN: [id number] Date of Birth: 11/14/1951  Today's Date: 02/15/2014 Time: [id number] Time Calculation (min): 45 min  Short Term Goals: Week 1: SLP Short Term Goal 1 (Week 1): Pt will demonstrate adequate mastication and oral clearance with trials of regular textures with Min cues SLP Short Term Goal 2 (Week 1): Pt will utilize speech intelligibility strategies at the sentence level with Mod cues SLP Short Term Goal 3 (Week 1): Pt will recall 4 out of 4 speech intelligibility strategies with Min cues SLP Short Term Goal 4 (Week 1): Pt will demonstrate complex problem solving during functional task with Supervision level cues SLP Short Term Goal 5 (Week 1): Pt will utilize internal/external memory aids to facilitate working memory during functional tasks with Supervision level cues  Skilled Therapeutic Interventions: Treatment focused on speech and swallowing goals. SLP facilitated session with skilled observation of lunch meal, consisting of regular textures and thin liquids via cup sips. Pt utilized safe swallowing strategies and self-monitored for oral residue and anterior spillage with supervision, with no overt s/s of aspiration. Recommend to continue on regular textures, initially with intermittent supervision. Pt recalled 4 out of 4 speech intelligibility strategies with Mod I and extra time. He utilized them with his list of functional phrases with Mod I, and while reading at the sentence level with Min cues. Continue plan of care.   FIM:  Comprehension Comprehension Mode: Auditory Comprehension: 5-Understands complex 90% of the time/Cues < 10% of the time Expression Expression Mode: Verbal Expression: 4-Expresses basic 75 - 89% of the time/requires cueing 10 - 24% of the time. Needs helper to occlude trach/needs to repeat words. Social Interaction Social Interaction: 6-Interacts appropriately with others  with medication or extra time (anti-anxiety, antidepressant). Problem Solving Problem Solving: 5-Solves complex 90% of the time/cues < 10% of the time Memory Memory: 6-More than reasonable amt of time FIM - Eating Eating Activity: 5: Supervision/cues  Pain  No/denies pain  Therapy/Group: Individual Therapy   Maxcine HamLaura Paiewonsky, M.A. CCC-SLP [phone]  Maxcine Hamaiewonsky, Haizlee Henton 02/15/2014, 4:15 PM

## 2014-02-15 NOTE — Progress Notes (Signed)
Physical Therapy Weekly Progress Note  Patient Details  Name: Dakota Hall MRN: [id number] Date of Birth: May 01, 1951  Today's Date: 02/15/2014 Time: 1015-1100; [id number] Time Calculation (min): 45 min , 45 min  Patient has met 4 of 4 short term goals.  Pt has emerging RLE extensor and flexor synergies.  Patient continues to demonstrate the following deficits: motor control, balance, awareness, functional mobility and locomotion and therefore will continue to benefit from skilled PT intervention to enhance overall performance with activity tolerance, balance, postural control, ability to compensate for deficits, functional use of  right upper extremity and right lower extremity and awareness.  Patient progressing toward long term goals..  Continue plan of care.  PT Short Term Goals Week 1:  PT Short Term Goal 1 (Week 1): Pt will roll L with supervision, 1 VC. PT Short Term Goal 1 - Progress (Week 1): Met PT Short Term Goal 2 (Week 1): Pt will transfer to L with max assist of 1 person. PT Short Term Goal 2 - Progress (Week 1): Met PT Short Term Goal 3 (Week 1): Pt will propel w/c x 150' iwth supervision. PT Short Term Goal 3 - Progress (Week 1): Met PT Short Term Goal 4 (Week 1): Pt will perform gait x 20' with assistance of 2 people. PT Short Term Goal 4 - Progress (Week 1): Met  Skilled Therapeutic Interventions/Progress Updates:  Tx 1:  neuromuscular re-education- stretching hypertonic RLE hamstrings and heel cords, passive and active with pt using strap, R foot propped on stool; on Kinetron sitting and standing focusing on RLE stance stability components, during gait in hall using L rail, x 15; forward, 10' backward, ACE on RLe for foot drop, mod> max assist  W/c propulsion for activity tolerance, modified independent x 150' x 2  Tx 2:  W/c> mat squat pivot to L with min assist, mod VCS, for squat pivot.  Sit>< supine with close supervision, mod VCS, for sequence and awareness of  RUE and RLE neuromuscular re-education via demo, VCS, tactile cues for RLE hip and knee motor control in L sidelying/gravity eliminated position, using powder board and Maxislide fabric; for trunk flexion and wt shifting to scoot laterally in sitting, for bil bridging and bil lower trunk rotation, 10 x 2 each, in standing with LUE,trunk and L hip support laterally for R swing phase components.  Pt eventually demonstrated minimal R ankle DF and PF in gravity eliminated position.  Pt returned to room and remained in w/c, all needs within reach and fluids encouraged.    Therapy Documentation Precautions:  Precautions Precautions: Fall Precaution Comments: Rt hemiparesis  Restrictions Weight Bearing Restrictions: No Pain: Pain Assessment Pain Assessment: No/denies pain      See FIM for current functional status  Therapy/Group: Individual Therapy  Rosamond Andress 02/15/2014, 3:16 PM

## 2014-02-15 NOTE — Progress Notes (Signed)
Social Work Patient ID: Dakota Hall, male   DOB: [birth year], 63 y.o.   MRN: [id number] Met with pt to inform of team conference goals-supervision level and discharge 4/21.  He can see the progress but it is not as fast as he would like. Will contact wife regarding the conference so she can plan her FMLA and answer any questions she may have.  Work toward discharge date. [employer] authorized approval through 4/8 with update on 4/7.

## 2014-02-15 NOTE — Progress Notes (Signed)
Subjective/Complaints: 63 y.o. right-handed male with history of CVA x2, DM type 2, OSA, who was admitted 02/03/2014 with acute onset right-sided weakness, right facial weakness with difficulty speaking and was noted to have severely elevated BP requiring labetalol and nicardipine. CCT negative for acute changes. CTA head/neck with mild irregularity of Left PCA question thalamostriate type ischemia, no large vessel stenosis. Patient treated with TPA and MRI of the head 2x2.5 cm acute infarct left basal ganglia radiating to white matter tracts. MRA of the head with no major occlusion or stenosis. Echocardiogram ejection fraction 60% without emboli. Carotid Dopplers with no ICA stenosis. 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 Swallow evaluation done and showed evidence of pharyngeal dysphagia with delay in swallow and was placed on dysphagia 3, thin liquids  No new complaints. Right side still tight and affecting therapy Review of Systems -  Objective: Vital Signs: Blood pressure 151/76, pulse 65, temperature 94 F (34.4 C), temperature source Axillary, resp. rate 19, weight 108.9 kg (240 lb 1.3 oz), SpO2 98.00%. No results found. Results for orders placed during the hospital encounter of 02/08/14 (from the past 72 hour(s))  GLUCOSE, CAPILLARY     Status: Abnormal   Collection Time    02/12/14 11:32 AM      Result Value Ref Range   Glucose-Capillary 149 (*) 70 - 99 mg/dL  GLUCOSE, CAPILLARY     Status: Abnormal   Collection Time    02/12/14  4:22 PM      Result Value Ref Range   Glucose-Capillary 153 (*) 70 - 99 mg/dL  GLUCOSE, CAPILLARY     Status: Abnormal   Collection Time    02/12/14  8:26 PM      Result Value Ref Range   Glucose-Capillary 147 (*) 70 - 99 mg/dL  GLUCOSE, CAPILLARY     Status: Abnormal   Collection Time    02/13/14  7:27 AM      Result Value Ref Range   Glucose-Capillary 143 (*) 70 - 99 mg/dL   Comment 1  Notify RN    BASIC METABOLIC PANEL     Status: Abnormal   Collection Time    02/13/14  7:33 AM      Result Value Ref Range   Sodium 143  137 - 147 mEq/L   Potassium 3.5 (*) 3.7 - 5.3 mEq/L   Chloride 105  96 - 112 mEq/L   CO2 23  19 - 32 mEq/L   Glucose, Bld 147 (*) 70 - 99 mg/dL   BUN 28 (*) 6 - 23 mg/dL   Creatinine, Ser 1.15  0.50 - 1.35 mg/dL   Calcium 9.6  8.4 - 10.5 mg/dL   GFR calc non Af Amer 66 (*) >90 mL/min   GFR calc Af Amer 77 (*) >90 mL/min   Comment: (NOTE)     The eGFR has been calculated using the CKD EPI equation.     This calculation has not been validated in all clinical situations.     eGFR's persistently <90 mL/min signify possible Chronic Kidney     Disease.  GLUCOSE, CAPILLARY     Status: Abnormal   Collection Time    02/13/14 12:06 PM      Result Value Ref Range   Glucose-Capillary 110 (*) 70 - 99 mg/dL   Comment 1 Notify RN    GLUCOSE, CAPILLARY     Status: Abnormal   Collection Time    02/13/14  4:44 PM      Result Value Ref Range   Glucose-Capillary 136 (*) 70 - 99 mg/dL   Comment 1 Notify RN    GLUCOSE, CAPILLARY     Status: Abnormal   Collection Time    02/13/14  8:34 PM      Result Value Ref Range   Glucose-Capillary 170 (*) 70 - 99 mg/dL  GLUCOSE, CAPILLARY     Status: Abnormal   Collection Time    02/14/14  7:19 AM      Result Value Ref Range   Glucose-Capillary 131 (*) 70 - 99 mg/dL  URINE CULTURE     Status: None   Collection Time    02/14/14 11:02 AM      Result Value Ref Range   Specimen Description URINE, RANDOM     Special Requests NONE     Culture  Setup Time       Value: 02/14/2014 11:47     Performed at [facility]       Value: NO GROWTH     Performed at [facility]   Culture       Value: NO GROWTH     Performed at [facility]   Report Status 02/15/2014 FINAL    URINALYSIS, ROUTINE W REFLEX MICROSCOPIC     Status: Abnormal   Collection Time    02/14/14 11:04 AM      Result  Value Ref Range   Color, Urine YELLOW  YELLOW   APPearance CLEAR  CLEAR   Specific Gravity, Urine 1.023  1.005 - 1.030   pH 5.0  5.0 - 8.0   Glucose, UA 500 (*) NEGATIVE mg/dL   Hgb urine dipstick NEGATIVE  NEGATIVE   Bilirubin Urine NEGATIVE  NEGATIVE   Ketones, ur NEGATIVE  NEGATIVE mg/dL   Protein, ur NEGATIVE  NEGATIVE mg/dL   Urobilinogen, UA 1.0  0.0 - 1.0 mg/dL   Nitrite NEGATIVE  NEGATIVE   Leukocytes, UA NEGATIVE  NEGATIVE   Comment: MICROSCOPIC NOT DONE ON URINES WITH NEGATIVE PROTEIN, BLOOD, LEUKOCYTES, NITRITE, OR GLUCOSE <1000 mg/dL.  GLUCOSE, CAPILLARY     Status: Abnormal   Collection Time    02/14/14 12:04 PM      Result Value Ref Range   Glucose-Capillary 123 (*) 70 - 99 mg/dL  GLUCOSE, CAPILLARY     Status: Abnormal   Collection Time    02/14/14  4:34 PM      Result Value Ref Range   Glucose-Capillary 121 (*) 70 - 99 mg/dL  GLUCOSE, CAPILLARY     Status: Abnormal   Collection Time    02/14/14  9:06 PM      Result Value Ref Range   Glucose-Capillary 167 (*) 70 - 99 mg/dL  GLUCOSE, CAPILLARY     Status: Abnormal   Collection Time    02/15/14  7:37 AM      Result Value Ref Range   Glucose-Capillary 139 (*) 70 - 99 mg/dL   Comment 1 Notify RN       HEENT: normal Cardio: RRR Resp: CTA B/L and unlabored GI: BS positive and non distended Extremity:  Pulses positive and No Edema Skin:   Intact Neuro: Flat, Cranial Nerve II-XII normal, Normal Sensory, Abnormal Motor 0/5 in RUE, 2- to 2/5 R hip/knee ext synergy otherwise 0/5 and Tone:  Hypertonia 2-3/4 especially bicep, tricep, pec.  Hams/calf-1-2/4 Musc/Skel:  Normal Gen NAD   Assessment/Plan: 1. Functional deficits secondary to  Left BG infarct with R spastic HP which require 3+ hours per day of interdisciplinary therapy in a comprehensive inpatient rehab setting. Physiatrist is providing close team supervision and 24 hour management of active medical problems listed below. Physiatrist and rehab team  continue to assess barriers to discharge/monitor patient progress toward functional and medical goals. FIM: FIM - Bathing Bathing Steps Patient Completed: Chest;Right Arm;Abdomen;Right upper leg;Left upper leg;Left lower leg (including foot);Front perineal area;Buttocks Bathing: 4: Min-Patient completes 8-9 22f10 parts or 75+ percent  FIM - Upper Body Dressing/Undressing Upper body dressing/undressing steps patient completed: Thread/unthread left sleeve of pullover shirt/dress;Put head through opening of pull over shirt/dress;Pull shirt over trunk;Thread/unthread right sleeve of pullover shirt/dresss Upper body dressing/undressing: 5: Supervision: Safety issues/verbal cues FIM - Lower Body Dressing/Undressing Lower body dressing/undressing steps patient completed: Thread/unthread left pants leg;Thread/unthread right pants leg;Pull pants up/down;Don/Doff right shoe;Don/Doff left shoe;Don/Doff left sock;Don/Doff right sock Lower body dressing/undressing: 3: Mod-Patient completed 50-74% of tasks  FIM - Toileting Toileting: 0: Activity did not occur  FIM - TRadio producerDevices: Grab bars Toilet Transfers: 2-From toilet/BSC: Max A (lift and lower assist);2-To toilet/BSC: Max A (lift and lower assist)  FIM - BEngineer, siteAssistive Devices: Arm rests Bed/Chair Transfer: 3: Supine > Sit: Mod A (lifting assist/Pt. 50-74%/lift 2 legs;3: Bed > Chair or W/C: Mod A (lift or lower assist)  FIM - Locomotion: Wheelchair Distance: 150 Locomotion: Wheelchair: 5: Travels 150 ft or more: maneuvers on rugs and over door sills with supervision, cueing or coaxing FIM - Locomotion: Ambulation Locomotion: Ambulation Assistive Devices: Orthosis;Other (comment) (railing in hall, L hand) Ambulation/Gait Assistance: 2: Max assist Locomotion: Ambulation: 0: Activity did not occur  Comprehension Comprehension Mode: Auditory Comprehension: 6-Follows complex  conversation/direction: With extra time/assistive device  Expression Expression Mode: Verbal Expression: 4-Expresses basic 75 - 89% of the time/requires cueing 10 - 24% of the time. Needs helper to occlude trach/needs to repeat words.  Social Interaction Social Interaction: 6-Interacts appropriately with others with medication or extra time (anti-anxiety, antidepressant).  Problem Solving Problem Solving: 5-Solves complex 90% of the time/cues < 10% of the time  Memory Memory: 4-Recognizes or recalls 75 - 89% of the time/requires cueing 10 - 24% of the time   Medical Problem List and Plan:  1. 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  2. DVT Prophylaxis/Anticoagulation: Subcutaneous Lovenox. Monitor platelet counts any signs of bleeding  3. Pain Management: Tylenol as needed  4. H/o depression with anxiety/Mood: Stable at this time. LCSW to follow for evaluation. Continue Abilify 2.5 mg daily and Zoloft 150 mg daily. Will use ativan 0.5 mg tid prn for anxiety.  5. Neuropsych: This patient is capable of making decisions on his own behalf.  6. Diabetes mellitus: Hemoglobin A1c 8.1. Check blood sugars a.c. and at bedtime.  7. Hypertension: Will monitor BP with bid checks--continue Norvasc 10 mg daily, Bystolic 553mdaily, Diovan 160 mg daily, hydrochlorothiazide 12.5 mg daily. Monitor with increased mobility  8. Hyperlipidemia: On Zocor  9. Constipation: Will schedule Miralax for bowel program.  10. OSA: continue CPAP to help with sleep hygiene and energy levels.  11. Spasticity: continue to titrate baclofen up as tolerated andneeded 12.  Bradycardia D/c BB 13. Malodorous urine: ua neg, culture pending LOS (Days) 7 A FACE TO FACE EVALUATION WAS PERFORMED  SWARTZ,ZACHARY T 02/15/2014, 9:55 AM

## 2014-02-16 ENCOUNTER — Inpatient Hospital Stay (HOSPITAL_COMMUNITY): Payer: Managed Care, Other (non HMO)

## 2014-02-16 ENCOUNTER — Inpatient Hospital Stay (HOSPITAL_COMMUNITY): Payer: Managed Care, Other (non HMO) | Admitting: Occupational Therapy

## 2014-02-16 DIAGNOSIS — I6330 Cerebral infarction due to thrombosis of unspecified cerebral artery: Secondary | ICD-10-CM

## 2014-02-16 DIAGNOSIS — R1313 Dysphagia, pharyngeal phase: Secondary | ICD-10-CM

## 2014-02-16 DIAGNOSIS — E119 Type 2 diabetes mellitus without complications: Secondary | ICD-10-CM

## 2014-02-16 DIAGNOSIS — I69991 Dysphagia following unspecified cerebrovascular disease: Secondary | ICD-10-CM

## 2014-02-16 DIAGNOSIS — I69959 Hemiplegia and hemiparesis following unspecified cerebrovascular disease affecting unspecified side: Secondary | ICD-10-CM

## 2014-02-16 DIAGNOSIS — I69922 Dysarthria following unspecified cerebrovascular disease: Secondary | ICD-10-CM

## 2014-02-16 LAB — GLUCOSE, CAPILLARY
Glucose-Capillary: 181 mg/dL — ABNORMAL HIGH (ref 70–99)
Glucose-Capillary: 143 mg/dL — ABNORMAL HIGH (ref 70–99)
Glucose-Capillary: 107 mg/dL — ABNORMAL HIGH (ref 70–99)
Glucose-Capillary: 149 mg/dL — ABNORMAL HIGH (ref 70–99)

## 2014-02-16 NOTE — Progress Notes (Signed)
Subjective/Complaints: 63 y.o. right-handed male with history of CVA x2, DM type 2, OSA, who was admitted 02/03/2014 with acute onset right-sided weakness, right facial weakness with difficulty speaking and was noted to have severely elevated BP requiring labetalol and nicardipine. CCT negative for acute changes. CTA head/neck with mild irregularity of Left PCA question thalamostriate type ischemia, no large vessel stenosis. Patient treated with TPA and MRI of the head 2x2.5 cm acute infarct left basal ganglia radiating to white matter tracts. MRA of the head with no major occlusion or stenosis. Echocardiogram ejection fraction 60% without emboli. Carotid Dopplers with no ICA stenosis. 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 Swallow evaluation done and showed evidence of pharyngeal dysphagia with delay in swallow and was placed on dysphagia 3, thin liquids  No new complaints. Right side still tight and affecting therapy Review of Systems -  Objective: Vital Signs: Blood pressure 120/66, pulse 48, temperature 98.1 F (36.7 C), temperature source Oral, resp. rate 17, weight 108.9 kg (240 lb 1.3 oz), SpO2 96.00%. No results found. Results for orders placed during the hospital encounter of 02/08/14 (from the past 72 hour(s))  GLUCOSE, CAPILLARY     Status: Abnormal   Collection Time    02/13/14 12:06 PM      Result Value Ref Range   Glucose-Capillary 110 (*) 70 - 99 mg/dL   Comment 1 Notify RN    GLUCOSE, CAPILLARY     Status: Abnormal   Collection Time    02/13/14  4:44 PM      Result Value Ref Range   Glucose-Capillary 136 (*) 70 - 99 mg/dL   Comment 1 Notify RN    GLUCOSE, CAPILLARY     Status: Abnormal   Collection Time    02/13/14  8:34 PM      Result Value Ref Range   Glucose-Capillary 170 (*) 70 - 99 mg/dL  GLUCOSE, CAPILLARY     Status: Abnormal   Collection Time    02/14/14  7:19 AM      Result Value Ref Range   Glucose-Capillary 131 (*) 70 - 99 mg/dL  URINE CULTURE     Status: None   Collection Time    02/14/14 11:02 AM      Result Value Ref Range   Specimen Description URINE, RANDOM     Special Requests NONE     Culture  Setup Time       Value: 02/14/2014 11:47     Performed at [facility]   Colony Count       Value: NO GROWTH     [facility]   Culture       Value: NO GROWTH     [facility]formed at [facility]   Report Status 02/15/2014 FINAL    URINALYSIS, ROUTINE W REFLEX MICROSCOPIC     Status: Abnormal   Collection Time    02/14/14 11:04 AM      Result Value Ref Range   Color, Urine YELLOW  YELLOW   APPearance CLEAR  CLEAR   Specific Gravity, Urine 1.023  1.005 - 1.030   pH 5.0  5.0 - 8.0   Glucose, UA 500 (*) NEGATIVE mg/dL   Hgb urine dipstick NEGATIVE  NEGATIVE   Bilirubin Urine NEGATIVE  NEGATIVE   Ketones, ur NEGATIVE  NEGATIVE mg/dL   Protein, ur NEGATIVE  NEGATIVE mg/dL   Urobilinogen, UA 1.0  0.0 - 1.0 mg/dL  Nitrite NEGATIVE  NEGATIVE   Leukocytes, UA NEGATIVE  NEGATIVE   Comment: MICROSCOPIC NOT DONE ON URINES WITH NEGATIVE PROTEIN, BLOOD, LEUKOCYTES, NITRITE, OR GLUCOSE <1000 mg/dL.  GLUCOSE, CAPILLARY     Status: Abnormal   Collection Time    02/14/14 12:04 PM      Result Value Ref Range   Glucose-Capillary 123 (*) 70 - 99 mg/dL  GLUCOSE, CAPILLARY     Status: Abnormal   Collection Time    02/14/14  4:34 PM      Result Value Ref Range   Glucose-Capillary 121 (*) 70 - 99 mg/dL  GLUCOSE, CAPILLARY     Status: Abnormal   Collection Time    02/14/14  9:06 PM      Result Value Ref Range   Glucose-Capillary 167 (*) 70 - 99 mg/dL  GLUCOSE, CAPILLARY     Status: Abnormal   Collection Time    02/15/14  7:37 AM      Result Value Ref Range   Glucose-Capillary 139 (*) 70 - 99 mg/dL   Comment 1 Notify RN    GLUCOSE, CAPILLARY     Status: Abnormal   Collection Time    02/15/14 11:18 AM      Result Value Ref Range    Glucose-Capillary 117 (*) 70 - 99 mg/dL   Comment 1 Notify RN    GLUCOSE, CAPILLARY     Status: Abnormal   Collection Time    02/15/14  4:20 PM      Result Value Ref Range   Glucose-Capillary 110 (*) 70 - 99 mg/dL   Comment 1 Notify RN    GLUCOSE, CAPILLARY     Status: Abnormal   Collection Time    02/15/14  9:11 PM      Result Value Ref Range   Glucose-Capillary 149 (*) 70 - 99 mg/dL  GLUCOSE, CAPILLARY     Status: Abnormal   Collection Time    02/16/14  7:29 AM      Result Value Ref Range   Glucose-Capillary 181 (*) 70 - 99 mg/dL     HEENT: normal Cardio: RRR Resp: CTA B/L and unlabored GI: BS positive and non distended Extremity:  Pulses positive and No Edema Skin:   Intact Neuro: Flat, Cranial Nerve II-XII normal, Normal Sensory, Abnormal Motor 0/5 in RUE except trace R grip, 2- to 2/5 R hip/knee ext synergy otherwise 0/5 and Tone:  Hypertonia 2-3/4 especially bicep, tricep, pec.  Hams/calf-1-2/4 Musc/Skel:  Normal Gen NAD   Assessment/Plan: 1. Functional deficits secondary to Left BG infarct with R spastic HP which require 3+ hours per day of interdisciplinary therapy in a comprehensive inpatient rehab setting. Physiatrist is providing close team supervision and 24 hour management of active medical problems listed below. Physiatrist and rehab team continue to assess barriers to discharge/monitor patient progress toward functional and medical goals. FIM: FIM - Bathing Bathing Steps Patient Completed: Chest;Right Arm;Abdomen;Right upper leg;Left upper leg;Left lower leg (including foot);Front perineal area;Buttocks Bathing: 4: Min-Patient completes 8-9 51f 10 parts or 75+ percent  FIM - Upper Body Dressing/Undressing Upper body dressing/undressing steps patient completed: Thread/unthread left sleeve of pullover shirt/dress;Put head through opening of pull over shirt/dress;Pull shirt over trunk;Thread/unthread right sleeve of pullover shirt/dresss Upper body  dressing/undressing: 5: Supervision: Safety issues/verbal cues FIM - Lower Body Dressing/Undressing Lower body dressing/undressing steps patient completed: Thread/unthread left pants leg;Thread/unthread right pants leg;Pull pants up/down;Don/Doff right shoe;Don/Doff left shoe;Don/Doff left sock;Don/Doff right sock Lower body dressing/undressing: 3: Mod-Patient completed 50-74% of  tasks  FIM - Toileting Toileting: 0: Activity did not occur  FIM - Diplomatic Services operational officer Devices: Grab bars Toilet Transfers: 2-From toilet/BSC: Max A (lift and lower assist);2-To toilet/BSC: Max A (lift and lower assist)  FIM - Press photographer Assistive Devices: Arm rests Bed/Chair Transfer: 3: Supine > Sit: Mod A (lifting assist/Pt. 50-74%/lift 2 legs;3: Bed > Chair or W/C: Mod A (lift or lower assist)  FIM - Locomotion: Wheelchair Distance: 150 Locomotion: Wheelchair: 5: Travels 150 ft or more: maneuvers on rugs and over door sills with supervision, cueing or coaxing FIM - Locomotion: Ambulation Locomotion: Ambulation Assistive Devices: Orthosis;Other (comment) (railing in hall, L hand) Ambulation/Gait Assistance: 2: Max assist Locomotion: Ambulation: 0: Activity did not occur  Comprehension Comprehension Mode: Auditory Comprehension: 5-Understands complex 90% of the time/Cues < 10% of the time  Expression Expression Mode: Verbal Expression: 4-Expresses basic 75 - 89% of the time/requires cueing 10 - 24% of the time. Needs helper to occlude trach/needs to repeat words.  Social Interaction Social Interaction: 6-Interacts appropriately with others with medication or extra time (anti-anxiety, antidepressant).  Problem Solving Problem Solving: 5-Solves complex 90% of the time/cues < 10% of the time  Memory Memory: 6-More than reasonable amt of time   Medical Problem List and Plan:  1. 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  2. DVT Prophylaxis/Anticoagulation: Subcutaneous Lovenox. Monitor platelet counts any signs of bleeding  3. Pain Management: Tylenol as needed  4. H/o depression with anxiety/Mood: Stable at this time. LCSW to follow for evaluation. Continue Abilify 2.5 mg daily and Zoloft 150 mg daily. Will use ativan 0.5 mg tid prn for anxiety.  5. Neuropsych: This patient is capable of making decisions on his own behalf.  6. Diabetes mellitus: Hemoglobin A1c 8.1. Check blood sugars a.c. and at bedtime.  7. Hypertension: Will monitor BP with bid checks--continue Norvasc 10 mg daily, Bystolic 5mg  daily, Diovan 160 mg daily, hydrochlorothiazide 12.5 mg daily. Monitor with increased mobility  8. Hyperlipidemia: On Zocor  9. Constipation: Will schedule Miralax for bowel program.  10. OSA: continue CPAP to help with sleep hygiene and energy levels.  11. Spasticity: continue to titrate baclofen up as tolerated andneeded 12.  Bradycardia D/c BB 13. Malodorous urine: ua neg culture neg LOS (Days) 8 A FACE TO FACE EVALUATION WAS PERFORMED  Dakota Hall E 02/16/2014, 8:42 AM

## 2014-02-16 NOTE — Progress Notes (Signed)
Physical Therapy Note  Patient Details  Name: Dakota Hall MRN: [id number] Date of Birth: 09/04/1951 Today's Date: 02/16/2014 1330-1430, 60 min individual therapy No pain reported  Pt propelled w/c independently room > gym for session, modified ind x 150'.  neuromuscular re-education : self stretching in sitting for hypertonic R hamstrings and heel cord Static stander x 25 min for prolonged RLE wt bearing, wt shifting, terminal hip extension, R trunk activation into elongation for trunk righting, R UE gross movements at shoulder and elbow to move objects R>L and partially L>R.  Kicking beach ball with RLE in standing and sitting to facilitate R swing phase components  Pt demonstrated R ankle DF and increased speed of RLE movement when kicking the ball.  Shilah Hefel 02/16/2014, 2:11 PM

## 2014-02-16 NOTE — Progress Notes (Signed)
Speech Language Pathology Weekly Progress and Session Notes  Patient Details  Name: Dakota Hall MRN: [id number] Date of Birth: Mar 28, 1951  Today's Date: 02/16/2014 Time: 1130-1213 Time Calculation (min): 43 min  Short Term Goals: Week 1: SLP Short Term Goal 1 (Week 1): Pt will demonstrate adequate mastication and oral clearance with trials of regular textures with Min cues SLP Short Term Goal 1 - Progress (Week 1): Met SLP Short Term Goal 2 (Week 1): Pt will utilize speech intelligibility strategies at the sentence level with Mod cues SLP Short Term Goal 2 - Progress (Week 1): Met SLP Short Term Goal 3 (Week 1): Pt will recall 4 out of 4 speech intelligibility strategies with Min cues SLP Short Term Goal 3 - Progress (Week 1): Met SLP Short Term Goal 4 (Week 1): Pt will demonstrate complex problem solving during functional task with Supervision level cues SLP Short Term Goal 4 - Progress (Week 1): Not met SLP Short Term Goal 5 (Week 1): Pt will utilize internal/external memory aids to facilitate working memory during functional tasks with Supervision level cues SLP Short Term Goal 5 - Progress (Week 1): Not met    New Short Term Goals: Week 2: SLP Short Term Goal 1 (Week 2): Pt will utilize speech intelligibility strategies at the sentence level with Supervision cues SLP Short Term Goal 2 (Week 2): Pt will demonstrate complex problem solving during functional task with Supervision level cues SLP Short Term Goal 3 (Week 2): Pt will utilize internal/external memory aids to facilitate working memory during functional tasks with Supervision level cues SLP Short Term Goal 4 (Week 2): Pt will utilize safe swallowing strategies with recommended textures with Mod I  Weekly Progress Updates: Pt has met 3 out of 5 STGs during this admission, with gains noted in speech intelligibility and swallowing function. Pt is consuming regular textures and thin liquids with straw with intermittent supervision.  He is using his speech intelligibility strategies at the sentence level with Min cues, and is able to recall his strategies with Mod I and extra time. Education is ongoing. Pt will benefit from continued SLP services to maximize safe swallowing, functional communication, and functional independence prior to discharge home with wife.   Intensity: Minumum of 1-2 x/day, 30 to 90 minutes Frequency: 5 out of 7 days Duration/Length of Stay: 25-28 days Treatment/Interventions: Cognitive remediation/compensation;Cueing hierarchy;Dysphagia/aspiration precaution training;Environmental controls;Functional tasks;Internal/external aids;Medication managment;Oral motor exercises;Patient/family education;Speech/Language facilitation;Therapeutic Activities   Daily Session Skilled Therapeutic Interventions: Treatment focused on cognitive and communication goals. SLP facilitated session with structured activity to practice speech intelligibility strategies. Pt utilized strategies at the sentence level throughout task with Min cues. He selected attention to task for ~35 minutes with no redirection needed. Pt received a phone call towards the end of the activity, and was noted to have to repeat himself x1 throughout ~5 minute conversation. Pt was observed drinking thin liquids via straw sips throughout session with no overt s/s of aspiration. Appears safe to initiate straw use as desired.     FIM:  Comprehension Comprehension Mode: Auditory Comprehension: 5-Understands complex 90% of the time/Cues < 10% of the time Expression Expression Mode: Verbal Expression: 5-Expresses basic 90% of the time/requires cueing < 10% of the time. Problem Solving Problem Solving: 5-Solves complex 90% of the time/cues < 10% of the time Memory Memory: 6-More than reasonable amt of time FIM - Eating Eating Activity: 6: Swallowing techniques: self-managed General    Pain Pain Assessment Pain Assessment: No/denies  pain  Therapy/Group:  Individual Therapy   [name], M.A. CCC-SLP [phone]  [name] 02/16/2014, 1:12 PM

## 2014-02-16 NOTE — Progress Notes (Signed)
Occupational Therapy Session Note  Patient Details  Name: Dakota Hall MRN: [id number] Date of Birth: 07/04/1951  Today's Date: 02/16/2014 Time: 0910-1005 and 1430-1500 Time Calculation (min): 55 min and 30 min  Short Term Goals: Week 1:  OT Short Term Goal 1 (Week 1): Pt will be able to complete a squat pivot transfer to the toilet using grab bars with mod A x 1.  OT Short Term Goal 2 (Week 1): Pt will stand at sink with min A to allow him to complete LB dressing. OT Short Term Goal 3 (Week 1): Pt will don shirt with min A. OT Short Term Goal 4 (Week 1): Pt will don pants with min A. OT Short Term Goal 5 (Week 1): Pt will be able to cleanse himself on toilet with steady A.  Skilled Therapeutic Interventions/Progress Updates:    Visit 1:  No c/o pain. Pt seen for BADL training of B/D from EOB with a focus on dynamic sitting balance and hemi dressing techniques.  Pt was able to sit to EOB with A only to move his legs.  He had more difficulty this am with his shirt as the fabric was tighter and needed more A with donning R sock and shoe due to increased tone in leg. He did well coming into standing and maintaining his balance to pull his pants over hips. Pt completed his grooming.  He was then seen in gym for RUE neuro re-ed with weightbearing, a/arom in various planes with and without gravity eliminated.  Pt's PROM is functional even though he has increased tone in his arm.  He was able to actively adduct and flex his shoulder slightly. Pt taken back to his room with phone and call light in reach.  Visit 2: No c/o pain, but pt was very tired and did not want to work on the mat. He was seen from chair level to facilitate RUE arom with gravity eliminated table slides focusing on shoulder and elbow control.  From this position, pt demonstrated more active movement of elbow flexors. He then worked on pushing and pulling movements demonstrated 20-30 degrees of supported shoulder flexion.  A/Arom of elbow  flexion to reach hand to chin.  Pt requested to go back to bed. Transferred w/c to bed with mod-max A to left with mod a to lift legs into bed. Pt resting with call light in reach.    Therapy Documentation Precautions:  Precautions Precautions: Fall Precaution Comments: Rt hemiparesis  Restrictions Weight Bearing Restrictions: No   ADL: ADL ADL Comments: Refer to FIM  See FIM for current functional status  Therapy/Group: Individual Therapy  SAGUIER,JULIA 02/16/2014, 11:07 AM

## 2014-02-16 NOTE — Progress Notes (Signed)
CPAP machine set up at bedside with patients home mask (nasal pillows) and tubing on auto titrate settings. Patient states is able to placed himself on/off as needed. Patient encouraged to call if assistance needed.

## 2014-02-17 ENCOUNTER — Inpatient Hospital Stay (HOSPITAL_COMMUNITY): Payer: Managed Care, Other (non HMO) | Admitting: Occupational Therapy

## 2014-02-17 ENCOUNTER — Inpatient Hospital Stay (HOSPITAL_COMMUNITY): Payer: Managed Care, Other (non HMO)

## 2014-02-17 ENCOUNTER — Inpatient Hospital Stay (HOSPITAL_COMMUNITY): Payer: Managed Care, Other (non HMO) | Admitting: *Deleted

## 2014-02-17 LAB — GLUCOSE, CAPILLARY
Glucose-Capillary: 156 mg/dL — ABNORMAL HIGH (ref 70–99)
Glucose-Capillary: 100 mg/dL — ABNORMAL HIGH (ref 70–99)
Glucose-Capillary: 109 mg/dL — ABNORMAL HIGH (ref 70–99)
Glucose-Capillary: 137 mg/dL — ABNORMAL HIGH (ref 70–99)

## 2014-02-17 NOTE — Progress Notes (Signed)
Physical Therapy Session Note  Patient Details  Name: Dakota Hall MRN: [id number] Date of Birth: 01/13/1951  Today's Date: 02/17/2014 Time: 8:55-9:00 (55min)   Skilled Therapeutic Interventions/Progress Updates:  Tx focused on functional mobility training, NMR in sitting and standing, and gait at hand rail in hall.  Pt instructed in bed mobility, needing only S and increased time and effort log rolling to pt's L; Mod A bed mob to R, cues for efficiency.   Performed bed>WC and WC<>mat with multiple reps for practice to control descent. Mod A overall and verbal cues for scooting edge of mat.   Hemi-technique WC propulsion 2x150' with assist for WC parts and set-up.   Performed [place] sit<>stand for increased R weight-bearing and up to max A for lifting 2x10. Static standing at miror x202min with manual facilitation at R knee, hips, and upright trunk. Performed reach/placing tasks on pt's R for increased R weight-shift and ext in both sitting and standing with horseshoes.   Supine bridging and lower trunk rotation with handout for over weekend x10 each with cues for technique.   Gait at rail in hall with Max A for RLE advancement and knee control in stance. ACE wrap DF assist. Manual facilitation for hip and trunk ext.      Therapy Documentation Precautions:  Precautions Precautions: Fall Precaution Comments: Rt hemiparesis  Restrictions Weight Bearing Restrictions: No General:   Vital Signs: Therapy Vitals Temp: 98.4 F (36.9 C) Temp src: Oral Pulse Rate: 49 Resp: 18 BP: 139/65 mmHg Patient Position, if appropriate: Lying Oxygen Therapy SpO2: 99 % O2 Device: None (Room air) Pain: none    See FIM for current functional status  Therapy/Group: Individual Therapy Clydene Lamingole Julita Ozbun, PT, DPT   02/17/2014, 8:35 AM

## 2014-02-17 NOTE — Progress Notes (Signed)
Subjective/Complaints: 63 y.o. right-handed male with history of CVA x2, DM type 2, OSA, who was admitted 02/03/2014 with acute onset right-sided weakness, right facial weakness with difficulty speaking and was noted to have severely elevated BP requiring labetalol and nicardipine. CCT negative for acute changes. CTA head/neck with mild irregularity of Left PCA question thalamostriate type ischemia, no large vessel stenosis. Patient treated with TPA and MRI of the head 2x2.5 cm acute infarct left basal ganglia radiating to white matter tracts. MRA of the head with no major occlusion or stenosis. Echocardiogram ejection fraction 60% without emboli. Carotid Dopplers with no ICA stenosis. 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 Swallow evaluation done and showed evidence of pharyngeal dysphagia with delay in swallow and was placed on dysphagia 3, thin liquids  No new complaints. See in gym, working with PT, difficulty with bed mobility and pelvic raises Review of Systems -aphasic  Objective: Vital Signs: Blood pressure 143/70, pulse 49, temperature 98.4 F (36.9 C), temperature source Oral, resp. rate 18, weight 108.9 kg (240 lb 1.3 oz), SpO2 99.00%. No results found. Results for orders placed during the hospital encounter of 02/08/14 (from the past 72 hour(s))  URINE CULTURE     Status: None   Collection Time    02/14/14 11:02 AM      Result Value Ref Range   Specimen Description URINE, RANDOM     Special Requests NONE     Culture  Setup Time       Value: 02/14/2014 11:47     Performed at [facility] Count       Value: NO GROWTH     Performed at [facility]   Culture       Value: NO GROWTH     Performed at [facility]   Report Status 02/15/2014 FINAL    URINALYSIS, ROUTINE W REFLEX MICROSCOPIC     Status: Abnormal   Collection Time    02/14/14 11:04 AM      Result Value Ref Range   Color,  Urine YELLOW  YELLOW   APPearance CLEAR  CLEAR   Specific Gravity, Urine 1.023  1.005 - 1.030   pH 5.0  5.0 - 8.0   Glucose, UA 500 (*) NEGATIVE mg/dL   Hgb urine dipstick NEGATIVE  NEGATIVE   Bilirubin Urine NEGATIVE  NEGATIVE   Ketones, ur NEGATIVE  NEGATIVE mg/dL   Protein, ur NEGATIVE  NEGATIVE mg/dL   Urobilinogen, UA 1.0  0.0 - 1.0 mg/dL   Nitrite NEGATIVE  NEGATIVE   Leukocytes, UA NEGATIVE  NEGATIVE   Comment: MICROSCOPIC NOT DONE ON URINES WITH NEGATIVE PROTEIN, BLOOD, LEUKOCYTES, NITRITE, OR GLUCOSE <1000 mg/dL.  GLUCOSE, CAPILLARY     Status: Abnormal   Collection Time    02/14/14 12:04 PM      Result Value Ref Range   Glucose-Capillary 123 (*) 70 - 99 mg/dL  GLUCOSE, CAPILLARY     Status: Abnormal   Collection Time    02/14/14  4:34 PM      Result Value Ref Range   Glucose-Capillary 121 (*) 70 - 99 mg/dL  GLUCOSE, CAPILLARY     Status: Abnormal   Collection Time    02/14/14  9:06 PM      Result Value Ref Range   Glucose-Capillary 167 (*) 70 - 99 mg/dL  GLUCOSE, CAPILLARY     Status: Abnormal   Collection Time    02/15/14  7:37 AM      Result Value Ref Range   Glucose-Capillary 139 (*) 70 - 99 mg/dL   Comment 1 Notify RN    GLUCOSE, CAPILLARY     Status: Abnormal   Collection Time    02/15/14 11:18 AM      Result Value Ref Range   Glucose-Capillary 117 (*) 70 - 99 mg/dL   Comment 1 Notify RN    GLUCOSE, CAPILLARY     Status: Abnormal   Collection Time    02/15/14  4:20 PM      Result Value Ref Range   Glucose-Capillary 110 (*) 70 - 99 mg/dL   Comment 1 Notify RN    GLUCOSE, CAPILLARY     Status: Abnormal   Collection Time    02/15/14  9:11 PM      Result Value Ref Range   Glucose-Capillary 149 (*) 70 - 99 mg/dL  GLUCOSE, CAPILLARY     Status: Abnormal   Collection Time    02/16/14  7:29 AM      Result Value Ref Range   Glucose-Capillary 181 (*) 70 - 99 mg/dL  GLUCOSE, CAPILLARY     Status: Abnormal   Collection Time    02/16/14 11:37 AM       Result Value Ref Range   Glucose-Capillary 143 (*) 70 - 99 mg/dL  GLUCOSE, CAPILLARY     Status: Abnormal   Collection Time    02/16/14  4:03 PM      Result Value Ref Range   Glucose-Capillary 107 (*) 70 - 99 mg/dL  GLUCOSE, CAPILLARY     Status: Abnormal   Collection Time    02/16/14  8:41 PM      Result Value Ref Range   Glucose-Capillary 149 (*) 70 - 99 mg/dL   Comment 1 Notify RN    GLUCOSE, CAPILLARY     Status: Abnormal   Collection Time    02/17/14  7:14 AM      Result Value Ref Range   Glucose-Capillary 156 (*) 70 - 99 mg/dL   Comment 1 Notify RN       HEENT: normal Cardio: RRR Resp: CTA B/L and unlabored GI: BS positive and non distended Extremity:  Pulses positive and No Edema Skin:   Intact Neuro: Flat, Cranial Nerve II-XII normal, Normal Sensory, Abnormal Motor 0/5 in RUE except trace R grip, 2- to 2/5 R hip/knee ext synergy otherwise 0/5 and Tone:  Hypertonia 2-3/4 especially bicep, tricep, pec.  Hams/calf-1-2/4 Musc/Skel:  Normal Gen NAD   Assessment/Plan: 1. Functional deficits secondary to Left BG infarct with R spastic HP which require 3+ hours per day of interdisciplinary therapy in a comprehensive inpatient rehab setting. Physiatrist is providing close team supervision and 24 hour management of active medical problems listed below. Physiatrist and rehab team continue to assess barriers to discharge/monitor patient progress toward functional and medical goals. FIM: FIM - Bathing Bathing Steps Patient Completed: Chest;Right Arm;Abdomen;Right upper leg;Left upper leg;Left lower leg (including foot);Front perineal area;Buttocks Bathing: 4: Min-Patient completes 8-9 [id number] 10 parts or 75+ percent  FIM - Upper Body Dressing/Undressing Upper body dressing/undressing steps patient completed: Thread/unthread left sleeve of pullover shirt/dress;Put head through opening of pull over shirt/dress;Thread/unthread right sleeve of pullover shirt/dresss Upper body  dressing/undressing: 4: Min-Patient completed 75 plus % of tasks FIM - Lower Body Dressing/Undressing Lower body dressing/undressing steps patient completed: Thread/unthread left pants leg;Thread/unthread right pants leg;Pull pants up/down;Don/Doff left shoe;Don/Doff left sock Lower body dressing/undressing: 3: Mod-Patient  completed 50-74% of tasks  FIM - Toileting Toileting: 0: Activity did not occur  FIM - Diplomatic Services operational officer Devices: Grab bars Toilet Transfers: 2-From toilet/BSC: Max A (lift and lower assist);2-To toilet/BSC: Max A (lift and lower assist)  FIM - Press photographer Assistive Devices: Arm rests Bed/Chair Transfer: 3: Supine > Sit: Mod A (lifting assist/Pt. 50-74%/lift 2 legs;3: Sit > Supine: Mod A (lifting assist/Pt. 50-74%/lift 2 legs);3: Bed > Chair or W/C: Mod A (lift or lower assist);3: Chair or W/C > Bed: Mod A (lift or lower assist)  FIM - Locomotion: Wheelchair Distance: 150 Locomotion: Wheelchair: 6: Travels 150 ft or more, turns around, maneuvers to table, bed or toilet, negotiates 3% grade: maneuvers on rugs and over door sills independently FIM - Locomotion: Ambulation Locomotion: Ambulation Assistive Devices: Other (comment) (L hand rail in hall) Ambulation/Gait Assistance: 2: Max assist Locomotion: Ambulation: 1: Travels less than 50 ft with maximal assistance (Pt: 25 - 49%)  Comprehension Comprehension Mode: Auditory Comprehension: 5-Understands complex 90% of the time/Cues < 10% of the time  Expression Expression Mode: Verbal Expression: 5-Expresses basic 90% of the time/requires cueing < 10% of the time.  Social Interaction Social Interaction: 6-Interacts appropriately with others with medication or extra time (anti-anxiety, antidepressant).  Problem Solving Problem Solving: 5-Solves complex 90% of the time/cues < 10% of the time  Memory Memory: 6-More than reasonable amt of time   Medical Problem  List and Plan:  1. 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  2. DVT Prophylaxis/Anticoagulation: Subcutaneous Lovenox. Monitor platelet counts any signs of bleeding  3. Pain Management: Tylenol as needed  4. H/o depression with anxiety/Mood: Stable at this time. LCSW to follow for evaluation. Continue Abilify 2.5 mg daily and Zoloft 150 mg daily. Will use ativan 0.5 mg tid prn for anxiety.  5. Neuropsych: This patient is capable of making decisions on his own behalf.  6. Diabetes mellitus: Hemoglobin A1c 8.1. Check blood sugars a.c. and at bedtime.  7. Hypertension: Will monitor BP with bid checks--continue Norvasc 10 mg daily, Bystolic 5mg  daily, Diovan 160 mg daily, hydrochlorothiazide 12.5 mg daily. Monitor with increased mobility  8. Hyperlipidemia: On Zocor  9. Constipation: Will schedule Miralax for bowel program.  10. OSA: continue CPAP to help with sleep hygiene and energy levels.  11. Spasticity: continue to titrate baclofen up as tolerated andneeded 12.  Bradycardia D/c BB 13. Malodorous urine, incont: ua neg culture neg LOS (Days) 9 A FACE TO FACE EVALUATION WAS PERFORMED  Janith Nielson E 02/17/2014, 9:47 AM

## 2014-02-17 NOTE — Progress Notes (Signed)
Patient had placed himself on CPAP when RT entered the room. RT will continue to monitor.

## 2014-02-17 NOTE — Progress Notes (Signed)
Occupational Therapy Session Note  Patient Details  Name: Dakota Hall MRN: [id number] Date of Birth: 01/08/1951  Today's Date: 02/17/2014 Time: 0900-1000 and 1100-1130 Time Calculation (min): 60 min and 30 min   Short Term Goals: Week 1:  OT Short Term Goal 1 (Week 1): Pt will be able to complete a squat pivot transfer to the toilet using grab bars with mod A x 1.  OT Short Term Goal 2 (Week 1): Pt will stand at sink with min A to allow him to complete LB dressing. OT Short Term Goal 3 (Week 1): Pt will don shirt with min A. OT Short Term Goal 4 (Week 1): Pt will don pants with min A. OT Short Term Goal 5 (Week 1): Pt will be able to cleanse himself on toilet with steady A.  Skilled Therapeutic Interventions/Progress Updates:    Visit 1: No c/o pain.   Pt seen for BADL retraining of toileting, bathing, and dressing with a focus on functional transfers, sit to stand, standing with RLE weight bearing.  Pt transferred to toilet with min A using grab bar and squat pivot, sit to stand min A but needed RLE positioned as flexor tone was moving R foot in midline versus under his hip. Pt completed bathing using long sponge with steady A from a seated position on the bench.  Pt was able to complete his self care with min A today.  Pt then worked on self ROM exercises for his RUE.  Pt resting in w/c at end of session with call light in reach.  Visit 2: No c/o pain.  Pt seen this session for RUE neuro re-ed with weight bearing and reaching and a/arom to facilitate shoulder and elbow active movement. Pt worked in both sitting and supine. Pt did demonstrate increased shoulder protraction and elbow flexion along with tricep extension. Pt transferred back to w/c and propelled himself down the hallway to his SLP session.  Therapy Documentation Precautions:  Precautions Precautions: Fall Precaution Comments: Rt hemiparesis  Restrictions Weight Bearing Restrictions: No    Vital Signs: Therapy Vitals BP:  143/70 mmHg    ADL: ADL ADL Comments: Refer to FIM  See FIM for current functional status  Therapy/Group: Individual Therapy  Taimi Towe 02/17/2014, 12:03 PM

## 2014-02-17 NOTE — Progress Notes (Signed)
Occupational Therapy Session Note  Patient Details  Name: Dakota Hall MRN: [id number] Date of Birth: 10/17/1951  Beginning of progress report period: February 10, 2014 End of progress report period: February 18, 2014  Today's Date: 02/17/2014 Time: [id number] Time Calculation (min): 30 min  Short Term Goals: Week 1:  OT Short Term Goal 1 (Week 1): Pt will be able to complete a squat pivot transfer to the toilet using grab bars with mod A x 1.  OT Short Term Goal 2 (Week 1): Pt will stand at sink with min A to allow him to complete LB dressing. OT Short Term Goal 3 (Week 1): Pt will don shirt with min A. OT Short Term Goal 4 (Week 1): Pt will don pants with min A. OT Short Term Goal 5 (Week 1): Pt will be able to cleanse himself on toilet with steady A.  Skilled Therapeutic Interventions/Progress Updates:  Patient resting in w/c upon arrival.  Patient propelled self in w/c to and from therapy gym.  Engaged in NMR to include weight bearing into RUE and RLE in side sitting, sidelying, squating and standing to normalize tone.  Facilitated joint stabilization and muscle activation. Patient performed squat pivot transfers with min assist w/c to and from mat.  Therapy Documentation Precautions:  Precautions Precautions: Fall Precaution Comments: Rt hemiparesis  Restrictions Weight Bearing Restrictions: No Pain: Denies pain See FIM for current functional status  Therapy/Group: Individual Therapy  Dakota Hall 02/17/2014, 4:28 PM

## 2014-02-17 NOTE — Progress Notes (Signed)
CPAP set up at bedside, patient states he is able to place himself on/off.  Patient encouraged to call for Respiratory if assistance needed.

## 2014-02-17 NOTE — Progress Notes (Signed)
Speech Language Pathology Daily Session Note  Patient Details  Name: Dakota Hall MRN: [id number] Date of Birth: 11/06/1951  Beginning of progress report period: February 16, 2014 End of progress report period: February 24, 2014  Today's Date: 02/17/2014 Time: 1130-1200 Time Calculation (min): 30 min  Short Term Goals: Week 2: SLP Short Term Goal 1 (Week 2): Pt will utilize speech intelligibility strategies at the sentence level with Supervision cues SLP Short Term Goal 2 (Week 2): Pt will demonstrate complex problem solving during functional task with Supervision level cues SLP Short Term Goal 3 (Week 2): Pt will utilize internal/external memory aids to facilitate working memory during functional tasks with Supervision level cues SLP Short Term Goal 4 (Week 2): Pt will utilize safe swallowing strategies with recommended textures with Mod I  Skilled Therapeutic Interventions: Treatment focused on cognitive goals. SLP facilitated session with medication management task. Pt read and interpreted medication labels with Mod I. He required Min cues for recall of current medication regimen and their functions, particularly due to new medications added since admission. Pt consumed thin liquids via straw throughout session with no overt s/s of aspiration with Mod I. Continue plan of care.   FIM:  Comprehension Comprehension Mode: Auditory Comprehension: 6-Follows complex conversation/direction: With extra time/assistive device Expression Expression Mode: Verbal Expression: 5-Expresses basic 90% of the time/requires cueing < 10% of the time. Social Interaction Social Interaction: 6-Interacts appropriately with others with medication or extra time (anti-anxiety, antidepressant). Problem Solving Problem Solving: 6-Solves complex problems: With extra time Memory Memory: 5-Recognizes or recalls 90% of the time/requires cueing < 10% of the time FIM - Eating Eating Activity: 6: Swallowing techniques:  self-managed  Pain  No/denies pain  Therapy/Group: Individual Therapy   Dakota Hall, Dakota Hall [phone]  Dakota Hall, Dakota Hall 02/17/2014, 3:40 PM

## 2014-02-17 NOTE — Progress Notes (Signed)
Social Work Patient ID: Dakota Hall, male   DOB: [birth year], 63 y.o.   MRN: [id number] Met with pt to inform if wife got my message regarding conference and discharge date.  He reports she has. Will attempt to contact her again to see if questions.  Pt feels he is doing better and making progress.

## 2014-02-18 ENCOUNTER — Inpatient Hospital Stay (HOSPITAL_COMMUNITY): Payer: Managed Care, Other (non HMO) | Admitting: Physical Therapy

## 2014-02-18 LAB — GLUCOSE, CAPILLARY
GLUCOSE-CAPILLARY: 108 mg/dL — AB (ref 70–99)
GLUCOSE-CAPILLARY: 114 mg/dL — AB (ref 70–99)
Glucose-Capillary: 140 mg/dL — ABNORMAL HIGH (ref 70–99)
Glucose-Capillary: 138 mg/dL — ABNORMAL HIGH (ref 70–99)

## 2014-02-18 NOTE — Progress Notes (Signed)
Entered Pt room to find Pt already on CPAP for the night.

## 2014-02-18 NOTE — Progress Notes (Signed)
Subjective/Complaints: 63 y.o. right-handed male with history of CVA x2, DM type 2, OSA, who was admitted 02/03/2014 with acute onset right-sided weakness, right facial weakness with difficulty speaking and was noted to have severely elevated BP requiring labetalol and nicardipine. CCT negative for acute changes. CTA head/neck with mild irregularity of Left PCA question thalamostriate type ischemia, no large vessel stenosis. Patient treated with TPA and MRI of the head 2x2.5 cm acute infarct left basal ganglia radiating to white matter tracts. MRA of the head with no major occlusion or stenosis. Echocardiogram ejection fraction 60% without emboli. Carotid Dopplers with no ICA stenosis. 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 Swallow evaluation done and showed evidence of pharyngeal dysphagia with delay in swallow and was placed on dysphagia 3, thin liquids  No new complaints. Slept well, denies bowel or bladder issues Review of Systems -aphasic  Objective: Vital Signs: Blood pressure 142/63, pulse 47, temperature 97.5 F (36.4 C), temperature source Oral, resp. rate 18, weight 108.9 kg (240 lb 1.3 oz), SpO2 93.00%. No results found. Results for orders placed during the hospital encounter of 02/08/14 (from the past 72 hour(s))  GLUCOSE, CAPILLARY     Status: Abnormal   Collection Time    02/15/14 11:18 AM      Result Value Ref Range   Glucose-Capillary 117 (*) 70 - 99 mg/dL   Comment 1 Notify RN    GLUCOSE, CAPILLARY     Status: Abnormal   Collection Time    02/15/14  4:20 PM      Result Value Ref Range   Glucose-Capillary 110 (*) 70 - 99 mg/dL   Comment 1 Notify RN    GLUCOSE, CAPILLARY     Status: Abnormal   Collection Time    02/15/14  9:11 PM      Result Value Ref Range   Glucose-Capillary 149 (*) 70 - 99 mg/dL  GLUCOSE, CAPILLARY     Status: Abnormal   Collection Time    02/16/14  7:29 AM      Result Value Ref Range   Glucose-Capillary 181 (*) 70 - 99 mg/dL  GLUCOSE, CAPILLARY     Status: Abnormal   Collection Time    02/16/14 11:37 AM      Result Value Ref Range   Glucose-Capillary 143 (*) 70 - 99 mg/dL  GLUCOSE, CAPILLARY     Status: Abnormal   Collection Time    02/16/14  4:03 PM      Result Value Ref Range   Glucose-Capillary 107 (*) 70 - 99 mg/dL  GLUCOSE, CAPILLARY     Status: Abnormal   Collection Time    02/16/14  8:41 PM      Result Value Ref Range   Glucose-Capillary 149 (*) 70 - 99 mg/dL   Comment 1 Notify RN    GLUCOSE, CAPILLARY     Status: Abnormal   Collection Time    02/17/14  7:14 AM      Result Value Ref Range   Glucose-Capillary 156 (*) 70 - 99 mg/dL   Comment 1 Notify RN    GLUCOSE, CAPILLARY     Status: Abnormal   Collection Time    02/17/14 11:33 AM      Result Value Ref Range   Glucose-Capillary 100 (*) 70 - 99 mg/dL   Comment 1 Notify RN    GLUCOSE, CAPILLARY     Status: Abnormal   Collection Time    02/17/14  4:28 PM      Result Value Ref Range   Glucose-Capillary 109 (*) 70 - 99 mg/dL   Comment 1 Notify RN    GLUCOSE, CAPILLARY     Status: Abnormal   Collection Time    02/17/14  9:17 PM      Result Value Ref Range   Glucose-Capillary 137 (*) 70 - 99 mg/dL  GLUCOSE, CAPILLARY     Status: Abnormal   Collection Time    02/18/14  7:05 AM      Result Value Ref Range   Glucose-Capillary 140 (*) 70 - 99 mg/dL   Comment 1 Notify RN       HEENT: normal Cardio: RRR Resp: CTA B/L and unlabored GI: BS positive and non distended Extremity:  Pulses positive and No Edema Skin:   Intact Neuro: Flat, Cranial Nerve II-XII normal, Normal Sensory, Abnormal Motor 0/5 in RUE except trace R grip, 2- to 2/5 R hip/knee ext synergy otherwise 0/5 and Tone:  Hypertonia 2-3/4 especially bicep, tricep, pec.  Hams/calf-1-2/4 Musc/Skel:  Normal Gen NAD   Assessment/Plan: 1. Functional deficits secondary to Left BG infarct with R spastic HP which require 3+ hours per day of  interdisciplinary therapy in a comprehensive inpatient rehab setting. Physiatrist is providing close team supervision and 24 hour management of active medical problems listed below. Physiatrist and rehab team continue to assess barriers to discharge/monitor patient progress toward functional and medical goals. FIM: FIM - Bathing Bathing Steps Patient Completed: Chest;Right Arm;Abdomen;Right upper leg;Left upper leg;Left lower leg (including foot);Front perineal area;Buttocks;Left Arm;Right lower leg (including foot) (used long sponge) Bathing: 4: Steadying assist  FIM - Upper Body Dressing/Undressing Upper body dressing/undressing steps patient completed: Thread/unthread right sleeve of pullover shirt/dresss;Thread/unthread left sleeve of pullover shirt/dress;Put head through opening of pull over shirt/dress;Pull shirt over trunk Upper body dressing/undressing: 5: Set-up assist to: Obtain clothing/put away FIM - Lower Body Dressing/Undressing Lower body dressing/undressing steps patient completed: Thread/unthread left pants leg;Thread/unthread right pants leg;Pull pants up/down;Don/Doff left shoe;Don/Doff left sock;Don/Doff right sock;Don/Doff right shoe;Fasten/unfasten right shoe;Fasten/unfasten left shoe Lower body dressing/undressing: 4: Steadying Assist  FIM - Toileting Toileting steps completed by patient: Performs perineal hygiene;Adjust clothing prior to toileting;Adjust clothing after toileting Toileting: 4: Steadying assist  FIM - Diplomatic Services operational officer Devices: Grab bars Toilet Transfers: 4-From toilet/BSC: Min A (steadying Pt. > 75%);4-To toilet/BSC: Min A (steadying Pt. > 75%)  FIM - Bed/Chair Transfer Bed/Chair Transfer Assistive Devices: Arm rests Bed/Chair Transfer: 4: Supine > Sit: Min A (steadying Pt. > 75%/lift 1 leg);3: Sit > Supine: Mod A (lifting assist/Pt. 50-74%/lift 2 legs);4: Bed > Chair or W/C: Min A (steadying Pt. > 75%);4: Chair or W/C > Bed:  Min A (steadying Pt. > 75%)  FIM - Locomotion: Wheelchair Distance: 150 Locomotion: Wheelchair: 6: Travels 150 ft or more, turns around, maneuvers to table, bed or toilet, negotiates 3% grade: maneuvers on rugs and over door sills independently FIM - Locomotion: Ambulation Locomotion: Ambulation Assistive Devices: Other (comment) (L hand rail in hall) Ambulation/Gait Assistance: 2: Max assist Locomotion: Ambulation: 1: Travels less than 50 ft with maximal assistance (Pt: 25 - 49%)  Comprehension Comprehension Mode: Auditory Comprehension: 6-Follows complex conversation/direction: With extra time/assistive device  Expression Expression Mode: Verbal Expression: 5-Expresses basic 90% of the time/requires cueing < 10% of the time.  Social Interaction Social Interaction: 6-Interacts appropriately with others with medication or extra time (anti-anxiety, antidepressant).  Problem Solving Problem Solving: 6-Solves complex problems: With extra time  Memory Memory:  5-Recognizes or recalls 90% of the time/requires cueing < 10% of the time   Medical Problem List and Plan:  1. 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  2. DVT Prophylaxis/Anticoagulation: Subcutaneous Lovenox. Monitor platelet counts any signs of bleeding  3. Pain Management: Tylenol as needed  4. H/o depression with anxiety/Mood: Stable at this time. LCSW to follow for evaluation. Continue Abilify 2.5 mg daily and Zoloft 150 mg daily. Will use ativan 0.5 mg tid prn for anxiety.  5. Neuropsych: This patient is capable of making decisions on his own behalf.  6. Diabetes mellitus: Hemoglobin A1c 8.1. Check blood sugars a.c. and at bedtime.  7. Hypertension: Will monitor BP with bid checks--continue Norvasc 10 mg daily, Bystolic 5mg  daily, Diovan 160 mg daily, hydrochlorothiazide 12.5 mg daily. Monitor with increased mobility  8. Hyperlipidemia: On Zocor  9. Constipation:  Will schedule Miralax for bowel program.  10. OSA: continue CPAP to help with sleep hygiene and energy levels.  11. Spasticity: continue to titrate baclofen up as tolerated andneeded 12.  Bradycardia D/c BB 13. Malodorous urine, incont: ua neg culture neg LOS (Days) 10 A FACE TO FACE EVALUATION WAS PERFORMED  Dakota Hall E 02/18/2014, 8:57 AM

## 2014-02-18 NOTE — Progress Notes (Addendum)
Physical Therapy Session Note  Patient Details  Name: Dakota Hall MRN: [id number] Date of Birth: [birth year]  Today's Date: 02/18/2014 Time: 6394-3200 Time Calculation (min): 45 min  Short Term Goals: Week 1:  PT Short Term Goal 1 (Week 1): Pt will roll L with supervision, 1 VC. PT Short Term Goal 1 - Progress (Week 1): Met PT Short Term Goal 2 (Week 1): Pt will transfer to L with max assist of 1 person. PT Short Term Goal 2 - Progress (Week 1): Met PT Short Term Goal 3 (Week 1): Pt will propel w/c x 150' iwth supervision. PT Short Term Goal 3 - Progress (Week 1): Met PT Short Term Goal 4 (Week 1): Pt will perform gait x 20' with assistance of 2 people. PT Short Term Goal 4 - Progress (Week 1): Met  Skilled Therapeutic Interventions/Progress Updates:      Therapy Documentation Precautions:  Precautions Precautions: Fall Precaution Comments: Rt hemiparesis  Restrictions Weight Bearing Restrictions: No General:   Vital Signs: Therapy Vitals Temp: 98.2 F (36.8 C) Temp src: Oral Pulse Rate: 58 Resp: 18 BP: 144/69 mmHg Patient Position, if appropriate: Lying Oxygen Therapy SpO2: 95 % O2 Device: None (Room air) Pain: denies pain  W/c management:(15') propelling hemi w/c easily in hallways using L LE and L UE 3 x 300'. Parts management dependent for removal of R 1/2 tray but can then reach and apply brakes including R side (unable to apply R brake with 1/2 tray still on armrest) Therapeutic Exercise:(15') Nu-Step Level 4 x 10' w/ R hand grip support applied. Supine exercises including bridging and LE lifts on L LE. Sitting in w/c LAQ on Left and manually resisted hip abduction and adduction. NeuroMuscular Re-education:(15') supine PNF Rhythmic Initiations for R UE distal to proximal timing and R LE with linear hip and knee flexion/extension.     Therapy/Group: Individual Therapy  Clearence Ped 02/18/2014, 4:18 PM

## 2014-02-19 LAB — GLUCOSE, CAPILLARY
GLUCOSE-CAPILLARY: 125 mg/dL — AB (ref 70–99)
GLUCOSE-CAPILLARY: 115 mg/dL — AB (ref 70–99)
Glucose-Capillary: 123 mg/dL — ABNORMAL HIGH (ref 70–99)
Glucose-Capillary: 142 mg/dL — ABNORMAL HIGH (ref 70–99)

## 2014-02-19 NOTE — Progress Notes (Signed)
Subjective/Complaints: 63 y.o. right-handed male with history of CVA x2, DM type 2, OSA, who was admitted 02/03/2014 with acute onset right-sided weakness, right facial weakness with difficulty speaking and was noted to have severely elevated BP requiring labetalol and nicardipine. CCT negative for acute changes. CTA head/neck with mild irregularity of Left PCA question thalamostriate type ischemia, no large vessel stenosis. Patient treated with TPA and MRI of the head 2x2.5 cm acute infarct left basal ganglia radiating to white matter tracts. MRA of the head with no major occlusion or stenosis. Echocardiogram ejection fraction 60% without emboli. Carotid Dopplers with no ICA stenosis. 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 Swallow evaluation done and showed evidence of pharyngeal dysphagia with delay in swallow and was placed on dysphagia 3, thin liquids  No new complaints. Slept well, denies bowel or bladder issues Review of Systems -aphasic  Objective: Vital Signs: Blood pressure 164/80, pulse 46, temperature 98.3 F (36.8 C), temperature source Oral, resp. rate 19, weight 108.9 kg (240 lb 1.3 oz), SpO2 94.00%. No results found. Results for orders placed during the hospital encounter of 02/08/14 (from the past 72 hour(s))  GLUCOSE, CAPILLARY     Status: Abnormal   Collection Time    02/16/14 11:37 AM      Result Value Ref Range   Glucose-Capillary 143 (*) 70 - 99 mg/dL  GLUCOSE, CAPILLARY     Status: Abnormal   Collection Time    02/16/14  4:03 PM      Result Value Ref Range   Glucose-Capillary 107 (*) 70 - 99 mg/dL  GLUCOSE, CAPILLARY     Status: Abnormal   Collection Time    02/16/14  8:41 PM      Result Value Ref Range   Glucose-Capillary 149 (*) 70 - 99 mg/dL   Comment 1 Notify RN    GLUCOSE, CAPILLARY     Status: Abnormal   Collection Time    02/17/14  7:14 AM      Result Value Ref Range   Glucose-Capillary 156 (*)  70 - 99 mg/dL   Comment 1 Notify RN    GLUCOSE, CAPILLARY     Status: Abnormal   Collection Time    02/17/14 11:33 AM      Result Value Ref Range   Glucose-Capillary 100 (*) 70 - 99 mg/dL   Comment 1 Notify RN    GLUCOSE, CAPILLARY     Status: Abnormal   Collection Time    02/17/14  4:28 PM      Result Value Ref Range   Glucose-Capillary 109 (*) 70 - 99 mg/dL   Comment 1 Notify RN    GLUCOSE, CAPILLARY     Status: Abnormal   Collection Time    02/17/14  9:17 PM      Result Value Ref Range   Glucose-Capillary 137 (*) 70 - 99 mg/dL  GLUCOSE, CAPILLARY     Status: Abnormal   Collection Time    02/18/14  7:05 AM      Result Value Ref Range   Glucose-Capillary 140 (*) 70 - 99 mg/dL   Comment 1 Notify RN    GLUCOSE, CAPILLARY     Status: Abnormal   Collection Time    02/18/14 11:34 AM      Result Value Ref Range   Glucose-Capillary 108 (*) 70 - 99 mg/dL   Comment 1 Notify RN    GLUCOSE, CAPILLARY     Status: Abnormal  Collection Time    02/18/14  4:26 PM      Result Value Ref Range   Glucose-Capillary 138 (*) 70 - 99 mg/dL   Comment 1 Notify RN    GLUCOSE, CAPILLARY     Status: Abnormal   Collection Time    02/18/14  9:09 PM      Result Value Ref Range   Glucose-Capillary 114 (*) 70 - 99 mg/dL  GLUCOSE, CAPILLARY     Status: Abnormal   Collection Time    02/19/14  7:26 AM      Result Value Ref Range   Glucose-Capillary 123 (*) 70 - 99 mg/dL     HEENT: normal Cardio: RRR Resp: CTA B/L and unlabored GI: BS positive and non distended Extremity:  Pulses positive and No Edema Skin:   Intact Neuro: Flat, Cranial Nerve II-XII normal, Normal Sensory, Abnormal Motor 0/5 in RUE except trace R grip, 2- to 2/5 R hip/knee ext synergy otherwise 0/5 and Tone:  Hypertonia 2-3/4 especially bicep, tricep, pec.  Hams/calf-1-2/4 Musc/Skel:  Normal Gen NAD   Assessment/Plan: 1. Functional deficits secondary to Left BG infarct with R spastic HP which require 3+ hours per day of  interdisciplinary therapy in a comprehensive inpatient rehab setting. Physiatrist is providing close team supervision and 24 hour management of active medical problems listed below. Physiatrist and rehab team continue to assess barriers to discharge/monitor patient progress toward functional and medical goals. FIM: FIM - Bathing Bathing Steps Patient Completed: Chest;Right Arm;Abdomen;Right upper leg;Left upper leg;Left lower leg (including foot);Front perineal area;Buttocks;Left Arm;Right lower leg (including foot) (used long sponge) Bathing: 4: Steadying assist  FIM - Upper Body Dressing/Undressing Upper body dressing/undressing steps patient completed: Thread/unthread right sleeve of pullover shirt/dresss;Thread/unthread left sleeve of pullover shirt/dress;Put head through opening of pull over shirt/dress;Pull shirt over trunk Upper body dressing/undressing: 5: Set-up assist to: Obtain clothing/put away FIM - Lower Body Dressing/Undressing Lower body dressing/undressing steps patient completed: Thread/unthread left pants leg;Thread/unthread right pants leg;Pull pants up/down;Don/Doff left shoe;Don/Doff left sock;Don/Doff right sock;Don/Doff right shoe;Fasten/unfasten right shoe;Fasten/unfasten left shoe Lower body dressing/undressing: 4: Steadying Assist  FIM - Toileting Toileting steps completed by patient: Performs perineal hygiene;Adjust clothing prior to toileting;Adjust clothing after toileting Toileting: 4: Steadying assist  FIM - Diplomatic Services operational officer Devices: Grab bars Toilet Transfers: 4-From toilet/BSC: Min A (steadying Pt. > 75%);4-To toilet/BSC: Min A (steadying Pt. > 75%)  FIM - Bed/Chair Transfer Bed/Chair Transfer Assistive Devices: Arm rests Bed/Chair Transfer: 4: Supine > Sit: Min A (steadying Pt. > 75%/lift 1 leg);3: Sit > Supine: Mod A (lifting assist/Pt. 50-74%/lift 2 legs);4: Bed > Chair or W/C: Min A (steadying Pt. > 75%);4: Chair or W/C > Bed:  Min A (steadying Pt. > 75%)  FIM - Locomotion: Wheelchair Distance: 150 Locomotion: Wheelchair: 6: Travels 150 ft or more, turns around, maneuvers to table, bed or toilet, negotiates 3% grade: maneuvers on rugs and over door sills independently FIM - Locomotion: Ambulation Locomotion: Ambulation Assistive Devices: Other (comment) (L hand rail in hall) Ambulation/Gait Assistance: 2: Max assist Locomotion: Ambulation: 1: Travels less than 50 ft with maximal assistance (Pt: 25 - 49%)  Comprehension Comprehension Mode: Auditory Comprehension: 6-Follows complex conversation/direction: With extra time/assistive device  Expression Expression Mode: Verbal Expression: 5-Expresses basic needs/ideas: With extra time/assistive device  Social Interaction Social Interaction: 6-Interacts appropriately with others with medication or extra time (anti-anxiety, antidepressant).  Problem Solving Problem Solving: 6-Solves complex problems: With extra time  Memory Memory: 5-Recognizes or recalls 90% of the  time/requires cueing < 10% of the time   Medical Problem List and Plan:  1. 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  2. DVT Prophylaxis/Anticoagulation: Subcutaneous Lovenox. Monitor platelet counts any signs of bleeding  3. Pain Management: Tylenol as needed  4. H/o depression with anxiety/Mood: Stable at this time. LCSW to follow for evaluation. Continue Abilify 2.5 mg daily and Zoloft 150 mg daily. Will use ativan 0.5 mg tid prn for anxiety.  5. Neuropsych: This patient is capable of making decisions on his own behalf.  6. Diabetes mellitus: Hemoglobin A1c 8.1. Check blood sugars a.c. and at bedtime.  7. Hypertension: Will monitor BP with bid checks--continue Norvasc 10 mg daily, Bystolic 5mg  daily, Diovan 160 mg daily, hydrochlorothiazide 12.5 mg daily. Monitor with increased mobility  8. Hyperlipidemia: On Zocor  9. Constipation: Will  schedule Miralax for bowel program.  10. OSA: continue CPAP to help with sleep hygiene and energy levels.  11. Spasticity: continue to titrate baclofen up as tolerated andneeded 12.  Bradycardia D/c BB 13. Malodorous urine, incont: ua neg culture neg LOS (Days) 11 A FACE TO FACE EVALUATION WAS PERFORMED  [name],[name] 02/19/2014, 7:57 AM

## 2014-02-20 ENCOUNTER — Inpatient Hospital Stay (HOSPITAL_COMMUNITY): Payer: Managed Care, Other (non HMO)

## 2014-02-20 ENCOUNTER — Telehealth: Payer: Self-pay | Admitting: Neurology

## 2014-02-20 ENCOUNTER — Encounter (HOSPITAL_COMMUNITY): Payer: Managed Care, Other (non HMO) | Admitting: Occupational Therapy

## 2014-02-20 DIAGNOSIS — I69922 Dysarthria following unspecified cerebrovascular disease: Secondary | ICD-10-CM

## 2014-02-20 DIAGNOSIS — I69991 Dysphagia following unspecified cerebrovascular disease: Secondary | ICD-10-CM

## 2014-02-20 DIAGNOSIS — I69959 Hemiplegia and hemiparesis following unspecified cerebrovascular disease affecting unspecified side: Secondary | ICD-10-CM

## 2014-02-20 DIAGNOSIS — I6330 Cerebral infarction due to thrombosis of unspecified cerebral artery: Secondary | ICD-10-CM

## 2014-02-20 DIAGNOSIS — E119 Type 2 diabetes mellitus without complications: Secondary | ICD-10-CM

## 2014-02-20 DIAGNOSIS — R1313 Dysphagia, pharyngeal phase: Secondary | ICD-10-CM

## 2014-02-20 LAB — GLUCOSE, CAPILLARY
Glucose-Capillary: 117 mg/dL — ABNORMAL HIGH (ref 70–99)
Glucose-Capillary: 108 mg/dL — ABNORMAL HIGH (ref 70–99)
Glucose-Capillary: 141 mg/dL — ABNORMAL HIGH (ref 70–99)
Glucose-Capillary: 163 mg/dL — ABNORMAL HIGH (ref 70–99)

## 2014-02-20 NOTE — Progress Notes (Signed)
Physical Therapy Note  Patient Details  Name: Dakota Hall MRN: [id number] Date of Birth: 08/01/1951 Today's Date: 02/20/2014 1140-1210, 30 min; 1400-1500, 60 min  individual therapy No pain reported AM or PM  Tx 1:  Therapeutic activity of standing at sturdy table during LUE task requiring wt shifting L><R, leaning against table fading to not leaning against table.  Focus on R knee control/awareness with R wt shift.  neuromuscular re-education via demo, manual cues for mini squats with bil UEs on sturdy table in front, 10 x 2; self-stretching R hamstrings and heel cord, using step stool and strap.  In room, pt provided with foot stool for propping up RLe for prolonged stretch in sitting in w/c.  Tx 2:  Discussed life-long fitness for health and wt management.  neuromuscular re-education as above for RLE hip and knee flexion in sitting, standing; RLE stance stability facilitation in supported standing; trunk shortening/lenthening/rotating while reaching out of BOS with L hand in supported sitting> unsupported sitting ( feet not touching floor).  Pt demonstrated minimal R knee ext and ankle DF when kicking a ball with R foot in sitting, feet off floor.   Gait with hallway rail in L hand, x 25' with mod assist, focusing on R stance stability and R foot placement as pt tends to substitute R hip adductors for hip flexors.  Up/down 3 (5") steps with L rail, +2 assist, step to forwards to ascend, step to backwards to descend.  Pt has 4 steps 2 rails to enter his house.  Discussed possible need for ramp.  Dakota Hall 02/20/2014, 12:20 PM

## 2014-02-20 NOTE — Telephone Encounter (Signed)
Pt's wife called.  She [facility] fax[facility] paperwork to our office on 02-13-14.  She wanted to check the status of the forms to make sure that they have been received.  She states that the forms are due by 03-02-14. She will be available until 5 pm at the work number of [phone]. Please contact.  Thank you

## 2014-02-20 NOTE — Progress Notes (Signed)
Speech Language Pathology Daily Session Note  Patient Details  Name: Dionne AnoRoger W Nancarrow MRN: [id number] Date of Birth: 02/02/1951  Today's Date: 02/20/2014 Time: 1000-1045 Time Calculation (min): 45 min  Short Term Goals: Week 2: SLP Short Term Goal 1 (Week 2): Pt will utilize speech intelligibility strategies at the sentence level with Supervision cues SLP Short Term Goal 2 (Week 2): Pt will demonstrate complex problem solving during functional task with Supervision level cues SLP Short Term Goal 3 (Week 2): Pt will utilize internal/external memory aids to facilitate working memory during functional tasks with Supervision level cues SLP Short Term Goal 4 (Week 2): Pt will utilize safe swallowing strategies with recommended textures with Mod I  Skilled Therapeutic Interventions: Treatment focused on cognitive and speech goals. SLP facilitated session with money management tasks. With supervision level verbal cueing, pt completed basic money counting/calculations as well as more complex checkbook balancing task. Pt utilized speech strategies while reading at the paragraph level with Min cues for use of strategies as well as self-monitoring and regulation. Continue plan of care.    FIM:  Comprehension Comprehension Mode: Auditory Comprehension: 6-Follows complex conversation/direction: With extra time/assistive device Expression Expression Mode: Verbal Expression: 5-Expresses basic needs/ideas: With extra time/assistive device Social Interaction Social Interaction: 6-Interacts appropriately with others with medication or extra time (anti-anxiety, antidepressant). Problem Solving Problem Solving: 5-Solves complex 90% of the time/cues < 10% of the time Memory Memory: 5-Recognizes or recalls 90% of the time/requires cueing < 10% of the time  Pain Pain Assessment Pain Assessment: No/denies pain  Therapy/Group: Individual Therapy   Maxcine HamLaura Paiewonsky, M.A. CCC-SLP [phone]  Maxcine Hamaiewonsky,  Mariah Harn 02/20/2014, 12:18 PM

## 2014-02-20 NOTE — Progress Notes (Signed)
Occupational Therapy Session Note  Patient Details  Name: Dionne AnoRoger W Steinfeldt MRN: [id number] Date of Birth: 09/25/1951  Today's Date: 02/20/2014 Time: 0900-1000 Time Calculation (min): 60 min  Short Term Goals: Week 1:  OT Short Term Goal 1 (Week 1): Pt will be able to complete a squat pivot transfer to the toilet using grab bars with mod A x 1.  OT Short Term Goal 2 (Week 1): Pt will stand at sink with min A to allow him to complete LB dressing. OT Short Term Goal 3 (Week 1): Pt will don shirt with min A. OT Short Term Goal 4 (Week 1): Pt will don pants with min A. OT Short Term Goal 5 (Week 1): Pt will be able to cleanse himself on toilet with steady A.  Skilled Therapeutic Interventions/Progress Updates:    1:1 self care retraining/ basic transfer/ RT UE NMR: Pt supine on arrival and required min (A) to progress to EOB with bed rail. Pt transferring pt patient request to the Rt side with w/c arm rest removed. Pt transferring mod (A) and required additional weight shifting to reposition into correct postural alignment. Pt washing at sink level this session. Pt noted to have soiled LB clothing due to urinal spillage. Pt opening all containers one handed and completing UB bathing. Pt noted to have supination of Rt LE and needed tactile input for repositioning for static standing at sink for LB bathing. Pt needed v/c to attend to Rt UE to position it for weight bearing stabilizing on sink surface. Pt demonstrates transfer from w/c to drop arm recliner to the right side mod (A) again requiring extended time / effort to align body into proper postural position. Pt enjoyed recliner and requesting to remain in recliner for slp session. Pt educated on Rt UE positioning in chair. Pt performed with LT UE assist : RT UE elbow flexion x 10 reps, shoulder flexion x 10 reps, PNF D1 x 20 reps in seated position. Pt using chair arm for passive stretch digits in sitting.  Therapy Documentation Precautions:   Precautions Precautions: Fall Precaution Comments: Rt hemiparesis  Restrictions Weight Bearing Restrictions: No General:   Vital Signs:   Pain: Pain Assessment Pain Assessment: No/denies pain ADL: ADL ADL Comments: Refer to FIM Exercises:   Other Treatments:    See FIM for current functional status  Therapy/Group: Individual Therapy  Harolyn RutherfordJones, Jadore Mcguffin B 02/20/2014, 1:08 PM Pager: [phone]

## 2014-02-20 NOTE — Progress Notes (Signed)
Subjective/Complaints: 63 y.o. right-handed male with history of CVA x2, DM type 2, OSA, who was admitted 02/03/2014 with acute onset right-sided weakness, right facial weakness with difficulty speaking and was noted to have severely elevated BP requiring labetalol and nicardipine. CCT negative for acute changes. CTA head/neck with mild irregularity of Left PCA question thalamostriate type ischemia, no large vessel stenosis. Patient treated with TPA and MRI of the head 2x2.5 cm acute infarct left basal ganglia radiating to white matter tracts. MRA of the head with no major occlusion or stenosis. Echocardiogram ejection fraction 60% without emboli. Carotid Dopplers with no ICA stenosis. 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 Swallow evaluation done and showed evidence of pharyngeal dysphagia with delay in swallow and was placed on dysphagia 3, thin liquids  No new complaints. Slept well, last BM 2 days ago, uses nasal CPAP Review of Systems -aphasic  Objective: Vital Signs: Blood pressure 143/72, pulse 50, temperature 97.5 F (36.4 C), temperature source Axillary, resp. rate 18, weight 108.9 kg (240 lb 1.3 oz), SpO2 100.00%. No results found. Results for orders placed during the hospital encounter of 02/08/14 (from the past 72 hour(s))  GLUCOSE, CAPILLARY     Status: Abnormal   Collection Time    02/17/14  7:14 AM      Result Value Ref Range   Glucose-Capillary 156 (*) 70 - 99 mg/dL   Comment 1 Notify RN    GLUCOSE, CAPILLARY     Status: Abnormal   Collection Time    02/17/14 11:33 AM      Result Value Ref Range   Glucose-Capillary 100 (*) 70 - 99 mg/dL   Comment 1 Notify RN    GLUCOSE, CAPILLARY     Status: Abnormal   Collection Time    02/17/14  4:28 PM      Result Value Ref Range   Glucose-Capillary 109 (*) 70 - 99 mg/dL   Comment 1 Notify RN    GLUCOSE, CAPILLARY     Status: Abnormal   Collection Time    02/17/14  9:17 PM       Result Value Ref Range   Glucose-Capillary 137 (*) 70 - 99 mg/dL  GLUCOSE, CAPILLARY     Status: Abnormal   Collection Time    02/18/14  7:05 AM      Result Value Ref Range   Glucose-Capillary 140 (*) 70 - 99 mg/dL   Comment 1 Notify RN    GLUCOSE, CAPILLARY     Status: Abnormal   Collection Time    02/18/14 11:34 AM      Result Value Ref Range   Glucose-Capillary 108 (*) 70 - 99 mg/dL   Comment 1 Notify RN    GLUCOSE, CAPILLARY     Status: Abnormal   Collection Time    02/18/14  4:26 PM      Result Value Ref Range   Glucose-Capillary 138 (*) 70 - 99 mg/dL   Comment 1 Notify RN    GLUCOSE, CAPILLARY     Status: Abnormal   Collection Time    02/18/14  9:09 PM      Result Value Ref Range   Glucose-Capillary 114 (*) 70 - 99 mg/dL  GLUCOSE, CAPILLARY     Status: Abnormal   Collection Time    02/19/14  7:26 AM      Result Value Ref Range   Glucose-Capillary 123 (*) 70 - 99 mg/dL  GLUCOSE, CAPILLARY  Status: Abnormal   Collection Time    02/19/14 11:33 AM      Result Value Ref Range   Glucose-Capillary 125 (*) 70 - 99 mg/dL   Comment 1 Notify RN    GLUCOSE, CAPILLARY     Status: Abnormal   Collection Time    02/19/14  4:36 PM      Result Value Ref Range   Glucose-Capillary 115 (*) 70 - 99 mg/dL   Comment 1 Notify RN    GLUCOSE, CAPILLARY     Status: Abnormal   Collection Time    02/19/14  8:51 PM      Result Value Ref Range   Glucose-Capillary 142 (*) 70 - 99 mg/dL   Comment 1 Notify RN       HEENT: normal Cardio: RRR Resp: CTA B/L and unlabored GI: BS positive and non distended Extremity:  Pulses positive and No Edema Skin:   Intact Neuro: Flat, Cranial Nerve II-XII normal, Normal Sensory, Abnormal Motor 0/5 in RUE except trace R grip, 2- to 2/5 R hip/knee ext synergy otherwise 0/5 and Tone:  Hypertonia 2-3/4 especially bicep, tricep, pec.  Hams/calf-1-2/4 Musc/Skel:  Normal Gen NAD   Assessment/Plan: 1. Functional deficits secondary to Left BG  infarct with R spastic HP which require 3+ hours per day of interdisciplinary therapy in a comprehensive inpatient rehab setting. Physiatrist is providing close team supervision and 24 hour management of active medical problems listed below. Physiatrist and rehab team continue to assess barriers to discharge/monitor patient progress toward functional and medical goals. FIM: FIM - Bathing Bathing Steps Patient Completed: Chest;Right Arm;Abdomen;Right upper leg;Left upper leg;Left lower leg (including foot);Front perineal area;Buttocks;Left Arm;Right lower leg (including foot) (used long sponge) Bathing: 4: Steadying assist  FIM - Upper Body Dressing/Undressing Upper body dressing/undressing steps patient completed: Thread/unthread right sleeve of pullover shirt/dresss;Thread/unthread left sleeve of pullover shirt/dress;Put head through opening of pull over shirt/dress;Pull shirt over trunk Upper body dressing/undressing: 5: Set-up assist to: Obtain clothing/put away FIM - Lower Body Dressing/Undressing Lower body dressing/undressing steps patient completed: Thread/unthread left pants leg;Thread/unthread right pants leg;Pull pants up/down;Don/Doff left shoe;Don/Doff left sock;Don/Doff right sock;Don/Doff right shoe;Fasten/unfasten right shoe;Fasten/unfasten left shoe Lower body dressing/undressing: 4: Steadying Assist  FIM - Toileting Toileting steps completed by patient: Adjust clothing prior to toileting;Performs perineal hygiene;Adjust clothing after toileting Toileting: 4: Steadying assist  FIM - Diplomatic Services operational officerToilet Transfers Toilet Transfers Assistive Devices: Grab bars Toilet Transfers: 4-From toilet/BSC: Min A (steadying Pt. > 75%);4-To toilet/BSC: Min A (steadying Pt. > 75%)  FIM - Bed/Chair Transfer Bed/Chair Transfer Assistive Devices: Arm rests Bed/Chair Transfer: 4: Supine > Sit: Min A (steadying Pt. > 75%/lift 1 leg);3: Sit > Supine: Mod A (lifting assist/Pt. 50-74%/lift 2 legs);4: Bed > Chair  or W/C: Min A (steadying Pt. > 75%);4: Chair or W/C > Bed: Min A (steadying Pt. > 75%)  FIM - Locomotion: Wheelchair Distance: 150 Locomotion: Wheelchair: 6: Travels 150 ft or more, turns around, maneuvers to table, bed or toilet, negotiates 3% grade: maneuvers on rugs and over door sills independently FIM - Locomotion: Ambulation Locomotion: Ambulation Assistive Devices: Other (comment) (L hand rail in hall) Ambulation/Gait Assistance: 2: Max assist Locomotion: Ambulation: 1: Travels less than 50 ft with maximal assistance (Pt: 25 - 49%)  Comprehension Comprehension Mode: Auditory Comprehension: 6-Follows complex conversation/direction: With extra time/assistive device  Expression Expression Mode: Verbal Expression: 5-Expresses complex 90% of the time/cues < 10% of the time  Social Interaction Social Interaction: 6-Interacts appropriately with others with medication or extra  time (anti-anxiety, antidepressant).  Problem Solving Problem Solving: 6-Solves complex problems: With extra time  Memory Memory: 5-Recognizes or recalls 90% of the time/requires cueing < 10% of the time   Medical Problem List and Plan:  1. 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  2. DVT Prophylaxis/Anticoagulation: Subcutaneous Lovenox. Monitor platelet counts any signs of bleeding  3. Pain Management: Tylenol as needed  4. H/o depression with anxiety/Mood: Stable at this time. LCSW to follow for evaluation. Continue Abilify 2.5 mg daily and Zoloft 150 mg daily. Will use ativan 0.5 mg tid prn for anxiety.  5. Neuropsych: This patient is capable of making decisions on his own behalf.  6. Diabetes mellitus: Hemoglobin A1c 8.1. Check blood sugars a.c. and at bedtime.  7. Hypertension: Will monitor BP with bid checks--continue Norvasc 10 mg daily, Bystolic 5mg  daily, Diovan 160 mg daily, hydrochlorothiazide 12.5 mg daily. Monitor with increased mobility   8. Hyperlipidemia: On Zocor  9. Constipation: Will schedule Miralax for bowel program.  10. OSA: continue CPAP to help with sleep hygiene and energy levels.  11. Spasticity: continue to titrate baclofen up as tolerated andneeded 12.  Bradycardia off BB, recheck EKG  LOS (Days) 12 A FACE TO FACE EVALUATION WAS PERFORMED  [name],[name] 02/20/2014, 7:02 AM

## 2014-02-21 ENCOUNTER — Inpatient Hospital Stay (HOSPITAL_COMMUNITY): Payer: Managed Care, Other (non HMO) | Admitting: *Deleted

## 2014-02-21 ENCOUNTER — Inpatient Hospital Stay (HOSPITAL_COMMUNITY): Payer: Managed Care, Other (non HMO) | Admitting: Speech Pathology

## 2014-02-21 ENCOUNTER — Inpatient Hospital Stay (HOSPITAL_COMMUNITY): Payer: Managed Care, Other (non HMO) | Admitting: Occupational Therapy

## 2014-02-21 DIAGNOSIS — I69959 Hemiplegia and hemiparesis following unspecified cerebrovascular disease affecting unspecified side: Secondary | ICD-10-CM

## 2014-02-21 DIAGNOSIS — R1313 Dysphagia, pharyngeal phase: Secondary | ICD-10-CM

## 2014-02-21 DIAGNOSIS — I6330 Cerebral infarction due to thrombosis of unspecified cerebral artery: Secondary | ICD-10-CM

## 2014-02-21 DIAGNOSIS — I69991 Dysphagia following unspecified cerebrovascular disease: Secondary | ICD-10-CM

## 2014-02-21 DIAGNOSIS — I69922 Dysarthria following unspecified cerebrovascular disease: Secondary | ICD-10-CM

## 2014-02-21 DIAGNOSIS — E119 Type 2 diabetes mellitus without complications: Secondary | ICD-10-CM

## 2014-02-21 LAB — GLUCOSE, CAPILLARY
GLUCOSE-CAPILLARY: 131 mg/dL — AB (ref 70–99)
GLUCOSE-CAPILLARY: 113 mg/dL — AB (ref 70–99)
Glucose-Capillary: 116 mg/dL — ABNORMAL HIGH (ref 70–99)
Glucose-Capillary: 131 mg/dL — ABNORMAL HIGH (ref 70–99)

## 2014-02-21 MED ORDER — SENNOSIDES-DOCUSATE SODIUM 8.6-50 MG PO TABS
2.0000 | ORAL_TABLET | Freq: Every day | ORAL | Status: DC
  Administered 2014-02-21 – 2014-03-02 (×10): 2 via ORAL
  Filled 2014-02-21 (×10): qty 2

## 2014-02-21 NOTE — Progress Notes (Signed)
Orthopedic Tech Progress Note Patient Details:  Dakota Hall 03/08/1951 [id number] Advanced called to place brace order Patient ID: Dakota Hall, male   DOB: 02/10/1951, 63 y.o.   MRN: [id number]   Dakota Hall 02/21/2014, 10:22 AM

## 2014-02-21 NOTE — Progress Notes (Signed)
Subjective/Complaints: 63 y.o. right-handed male with history of CVA x2, DM type 2, OSA, who was admitted 02/03/2014 with acute onset right-sided weakness, right facial weakness with difficulty speaking and was noted to have severely elevated BP requiring labetalol and nicardipine. CCT negative for acute changes. CTA head/neck with mild irregularity of Left PCA question thalamostriate type ischemia, no large vessel stenosis. Patient treated with TPA and MRI of the head 2x2.5 cm acute infarct left basal ganglia radiating to white matter tracts. MRA of the head with no major occlusion or stenosis. Echocardiogram ejection fraction 60% without emboli. Carotid Dopplers with no ICA stenosis. 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 Swallow evaluation done and showed evidence of pharyngeal dysphagia with delay in swallow and was placed on dysphagia 3, thin liquids  No new complaints. Slept well, last BM 3 days ago, uses nasal CPAP Review of Systems -aphasic  Objective: Vital Signs: Blood pressure 138/78, pulse 52, temperature 98 F (36.7 C), temperature source Oral, resp. rate 18, weight 108.9 kg (240 lb 1.3 oz), SpO2 95.00%. No results found. Results for orders placed during the hospital encounter of 02/08/14 (from the past 72 hour(s))  GLUCOSE, CAPILLARY     Status: Abnormal   Collection Time    02/18/14 11:34 AM      Result Value Ref Range   Glucose-Capillary 108 (*) 70 - 99 mg/dL   Comment 1 Notify RN    GLUCOSE, CAPILLARY     Status: Abnormal   Collection Time    02/18/14  4:26 PM      Result Value Ref Range   Glucose-Capillary 138 (*) 70 - 99 mg/dL   Comment 1 Notify RN    GLUCOSE, CAPILLARY     Status: Abnormal   Collection Time    02/18/14  9:09 PM      Result Value Ref Range   Glucose-Capillary 114 (*) 70 - 99 mg/dL  GLUCOSE, CAPILLARY     Status: Abnormal   Collection Time    02/19/14  7:26 AM      Result Value Ref Range    Glucose-Capillary 123 (*) 70 - 99 mg/dL  GLUCOSE, CAPILLARY     Status: Abnormal   Collection Time    02/19/14 11:33 AM      Result Value Ref Range   Glucose-Capillary 125 (*) 70 - 99 mg/dL   Comment 1 Notify RN    GLUCOSE, CAPILLARY     Status: Abnormal   Collection Time    02/19/14  4:36 PM      Result Value Ref Range   Glucose-Capillary 115 (*) 70 - 99 mg/dL   Comment 1 Notify RN    GLUCOSE, CAPILLARY     Status: Abnormal   Collection Time    02/19/14  8:51 PM      Result Value Ref Range   Glucose-Capillary 142 (*) 70 - 99 mg/dL   Comment 1 Notify RN    GLUCOSE, CAPILLARY     Status: Abnormal   Collection Time    02/20/14  7:16 AM      Result Value Ref Range   Glucose-Capillary 117 (*) 70 - 99 mg/dL   Comment 1 Notify RN    GLUCOSE, CAPILLARY     Status: Abnormal   Collection Time    02/20/14 11:27 AM      Result Value Ref Range   Glucose-Capillary 108 (*) 70 - 99 mg/dL   Comment 1 Notify RN  GLUCOSE, CAPILLARY     Status: Abnormal   Collection Time    02/20/14  4:02 PM      Result Value Ref Range   Glucose-Capillary 141 (*) 70 - 99 mg/dL  GLUCOSE, CAPILLARY     Status: Abnormal   Collection Time    02/20/14  9:27 PM      Result Value Ref Range   Glucose-Capillary 163 (*) 70 - 99 mg/dL     HEENT: normal Cardio: RRR Resp: CTA B/L and unlabored GI: BS positive and non distended Extremity:  Pulses positive and No Edema Skin:   Intact Neuro: Flat, Cranial Nerve II-XII normal, Normal Sensory, Abnormal Motor 0/5 in RUE except trace R grip, 2- to 2/5 R hip/knee ext synergy otherwise 0/5 and Tone:  Hypertonia 2-3/4 especially bicep, tricep, pec.  Hams/calf-1-2/4 Tone: [place] 3 FF Musc/Skel:  Normal Gen NAD   Assessment/Plan: 1. Functional deficits secondary to Left BG infarct with R spastic HP which require 3+ hours per day of interdisciplinary therapy in a comprehensive inpatient rehab setting. Physiatrist is providing close team supervision and 24 hour  management of active medical problems listed below. Physiatrist and rehab team continue to assess barriers to discharge/monitor patient progress toward functional and medical goals. FIM: FIM - Bathing Bathing Steps Patient Completed: Chest;Right Arm;Abdomen;Right upper leg;Left upper leg;Left lower leg (including foot);Front perineal area;Buttocks;Left Arm;Right lower leg (including foot) (used long sponge) Bathing: 4: Steadying assist  FIM - Upper Body Dressing/Undressing Upper body dressing/undressing steps patient completed: Thread/unthread right sleeve of pullover shirt/dresss;Thread/unthread left sleeve of pullover shirt/dress;Put head through opening of pull over shirt/dress;Pull shirt over trunk Upper body dressing/undressing: 5: Set-up assist to: Obtain clothing/put away FIM - Lower Body Dressing/Undressing Lower body dressing/undressing steps patient completed: Thread/unthread left pants leg;Thread/unthread right pants leg;Pull pants up/down;Don/Doff left shoe;Don/Doff left sock;Don/Doff right sock;Don/Doff right shoe;Fasten/unfasten right shoe;Fasten/unfasten left shoe Lower body dressing/undressing: 4: Steadying Assist  FIM - Toileting Toileting steps completed by patient: Adjust clothing prior to toileting;Performs perineal hygiene;Adjust clothing after toileting Toileting: 4: Steadying assist  FIM - Diplomatic Services operational officer Devices: Grab bars Toilet Transfers: 4-From toilet/BSC: Min A (steadying Pt. > 75%);4-To toilet/BSC: Min A (steadying Pt. > 75%)  FIM - Bed/Chair Transfer Bed/Chair Transfer Assistive Devices: Arm rests Bed/Chair Transfer: 4: Bed > Chair or W/C: Min A (steadying Pt. > 75%);4: Chair or W/C > Bed: Min A (steadying Pt. > 75%)  FIM - Locomotion: Wheelchair Distance: 150 Locomotion: Wheelchair: 6: Travels 150 ft or more, turns around, maneuvers to table, bed or toilet, negotiates 3% grade: maneuvers on rugs and over door sills  independently FIM - Locomotion: Ambulation Locomotion: Ambulation Assistive Devices: Other (comment) (L hand on hallway rail) Ambulation/Gait Assistance: 3: Mod assist Locomotion: Ambulation: 1: Travels less than 50 ft with maximal assistance (Pt: 25 - 49%)  Comprehension Comprehension Mode: Auditory Comprehension: 6-Follows complex conversation/direction: With extra time/assistive device  Expression Expression Mode: Verbal Expression: 5-Expresses basic needs/ideas: With extra time/assistive device  Social Interaction Social Interaction: 6-Interacts appropriately with others with medication or extra time (anti-anxiety, antidepressant).  Problem Solving Problem Solving: 5-Solves complex 90% of the time/cues < 10% of the time  Memory Memory: 5-Recognizes or recalls 90% of the time/requires cueing < 10% of the time   Medical Problem List and Plan:  1. 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  2. DVT Prophylaxis/Anticoagulation: Subcutaneous Lovenox. Monitor platelet counts any signs of bleeding  3. Pain Management: Tylenol  as needed  4. H/o depression with anxiety/Mood: Stable at this time. LCSW to follow for evaluation. Continue Abilify 2.5 mg daily and Zoloft 150 mg daily. Will use ativan 0.5 mg tid prn for anxiety.  5. Neuropsych: This patient is capable of making decisions on his own behalf.  6. Diabetes mellitus: Hemoglobin A1c 8.1. Check blood sugars a.c. and at bedtime.  7. Hypertension: Will monitor BP with bid checks--continue Norvasc 10 mg daily, Bystolic 5mg  daily, Diovan 160 mg daily, hydrochlorothiazide 12.5 mg daily. Monitor with increased mobility  8. Hyperlipidemia: On Zocor  9. Constipation: Will schedule Miralax for bowel program.  10. OSA: continue CPAP to help with sleep hygiene and energy levels.  11. Spasticity: continue to titrate baclofen , wrist hand orthosis on R 12.  Bradycardia off BB, recheck  EKG  LOS (Days) 13 A FACE TO FACE EVALUATION WAS PERFORMED  Neeti Knudtson E 02/21/2014, 7:11 AM

## 2014-02-21 NOTE — Progress Notes (Signed)
Pt seen, found already wearing cpap and appears to be tolerating well at this time.  Pt was advised that RT is available all night should he need further assistance.

## 2014-02-21 NOTE — Telephone Encounter (Signed)
I called Dakota JupiterLucy Hall re: Dr. Doroteo BradfordKirstein, whom is seeing pt  In Rehab, will fill out form for him.  Form received by Dakota JupiterLucy Hall, SW this am from [facility].  [phone], [phone].  I called and let wife know about the form and is now being filled out by Dr. Doroteo BradfordKirstein.  She had concerns about pts depression.  (pt tearful at times).  Spoke to her that depression common after stroke.  Instructed her to ask if they could have psych come by and speak to pt.  She would ask.   It is hard for her( [place], [place]

## 2014-02-21 NOTE — Progress Notes (Signed)
Recreational Therapy Assessment and Plan  Patient Details  Name: Dakota Hall MRN: [id number] Date of Birth: Oct 12, 1951 Today's Date: 02/21/2014  Rehab Potential: Good ELOS: 3 weeks   Assessment Clinical Impression: Problem List:  Patient Active Problem List    Diagnosis  Date Noted   .  CVA (cerebral infarction)  02/08/2014   .  Depression with anxiety    .  Malignant hypertension  02/06/2014   .  Obesity    .  Stroke  02/03/2014   .  Unspecified cerebral artery occlusion with cerebral infarction  07/20/2013   .  Stroke, small vessel  05/18/2012   .  Slurred speech  05/15/2012   .  HTN (hypertension)  05/15/2012   .  Noncompliance with medication regimen  05/15/2012   .  DM (diabetes mellitus)  05/15/2012   .  Vision changes  05/15/2012    Past Medical History:  Past Medical History   Diagnosis  Date   .  Diabetes mellitus    .  Hypertension    .  Stroke  July 2013     left paramedian pontine, incidental right parietal subcortical. both d/t small vessel disease   .  Stroke  March 2015     left basal ganglia secondary to small vessel disease   .  Obesity    .  Depression with anxiety    .  OSA on CPAP     Past Surgical History:  Past Surgical History   Procedure  Laterality  Date   .  Tonsillectomy     .  Anal fissure repair      Assessment & Plan  Clinical Impression: [name] is a 63 y.o. right-handed male with history of CVA x2, DM type 2, OSA, who was admitted 02/03/2014 with acute onset right-sided weakness, right facial weakness with difficulty speaking and was noted to have severely elevated BP requiring labetalol and nicardipine. CCT negative for acute changes. CTA head/neck with mild irregularity of Left PCA question thalamostriate type ischemia, no large vessel stenosis. Patient treated with TPA and MRI of the head 2x2.5 cm acute infarct left basal ganglia radiating to white matter tracts. MRA of the head with no major occlusion or stenosis. Echocardiogram  ejection fraction 60% without emboli. Patient transferred to CIR on 02/08/2014.   Pt presents with decreased activity tolerance, decreased functional mobility, decreased balance, right sided weakness, dyarthria Limiting pt's independence with leisure/community pursuits.   Leisure History/Participation Premorbid leisure interest/current participation: Medical laboratory scientific officer - Building control surveyor - Doctor, hospital - Travel (Comment);Crafts - Painting;Crafts - Other (Comment) Other Leisure Interests: Television;Movies;Reading;Computer;Cooking/Baking;Housework Leisure Participation Style: With Family/Friends Awareness of Community Resources: Good-identify 3 post discharge leisure resources Psychosocial / Spiritual Spiritual Interests: Church Patient agreeable to Pet Therapy: Yes Does patient have pets?: No (recently lost his cat) Social interaction - Mood/Behavior: Cooperative Academic librarian Appropriate for Education?: Yes Patient Agreeable to [employer]?: Yes Recreational Therapy Orientation Orientation -Reviewed with patient: Available activity resources Strengths/Weaknesses Patient Strengths/Abilities: Willingness to participate;Active premorbidly Patient weaknesses: Physical limitations TR Patient demonstrates impairments in the following area(s): Endurance;Motor;Safety TR Additional Impairment(s): None  Plan Rec Therapy Plan Is patient appropriate for Therapeutic Recreation?: Yes Rehab Potential: Good Treatment times per week: Min 1 time per week >20 minutes Estimated Length of Stay: 3 weeks TR Treatment/Interventions: Adaptive equipment instruction;1:1 session;Balance/vestibular training;Functional mobility training;Community reintegration;Patient/family education;Therapeutic activities;Recreation/leisure participation;Therapeutic exercise;UE/LE Coordination activities;Wheelchair propulsion/positioning Recommendations for other services: Neuropsych  Recommendations for other  services: Neuropsych  Discharge Criteria: Patient will be  discharged from TR if patient refuses treatment 3 consecutive times without medical reason.  If treatment goals not met, if there is a change in medical status, if patient makes no progress towards goals or if patient is discharged from hospital.  The above assessment, treatment plan, treatment alternatives and goals were discussed and mutually agreed upon: by patient  [place] 02/21/2014, 4:08 PM

## 2014-02-21 NOTE — Progress Notes (Signed)
Occupational Therapy Weekly Progress Note And Therapy Session Note  Patient Details  Name: Dakota Hall MRN: [id number] Date of Birth: Nov 27, 1950  Beginning of progress report period: February 10, 2014 End of progress report period: February 21, 2014  Today's Date: 02/21/2014 Time: 0800-0900 Time Calculation (min): 60 min  Patient has met 3 of 5 short term goals.  Patient making steady gains toward OT LTGs.  Standing balance during BADL tasks can vary from min-max assist secondary to pusher type tendencies and patient reports that he has been placed on bed pan for BMs therefore, Safety Plan updated to include not to use bed pan and instead use drop arm commode or toilet in patient's bathroom.  Patient tearful on 1 occasion during an afternoon session with this OT in response to decreased movement in RLE.  Patient continues to demonstrate the following deficits: hypertonia, hemiparesis of dominant side, decreased activity tolerance, decreased postural control, decreased static and dynamic standing balance with Pusher tendencies, unbalance muscle activation, RUE & RLE functional use and awareness, and therefore will continue to benefit from skilled OT intervention to enhance overall performance with BADL and Reduce care partner burden.  Patient progressing toward long term goals.  Continue plan of care.  OT Short Term Goals Week 1:   OT Short Term Goal 1 (Week 1): Pt will be able to complete a squat pivot transfer to the toilet using grab bars with mod A x 1.  OT Short Term Goal 1 - Progress (Week 1): Met OT Short Term Goal 2 (Week 1): Pt will stand at sink with min A to allow him to complete LB dressing. OT Short Term Goal 2 - Progress (Week 1): Progressing toward goal OT Short Term Goal 3 (Week 1): Pt will don shirt with min A. OT Short Term Goal 3 - Progress (Week 1): Met OT Short Term Goal 4 (Week 1): Pt will don pants with min A. OT Short Term Goal 4 - Progress (Week 1): Met OT Short Term Goal  5 (Week 1): Pt will be able to cleanse himself on toilet with steady A. OT Short Term Goal 5 - Progress (Week 1): Progressing toward goal Week 2:  OT Short Term Goal 1 (Week 2): Patient will stand at sink with min assist to allow for LB dressing OT Short Term Goal 2 (Week 2): Patient will be a able to perform hygiene while seated on toilet with steady assist OT Short Term Goal 3 (Week 2): Patient will manage RUE during all mobility and BADL tasks with no more than 2 vcs per session. OT Short Term Goal 4 (Week 2): Patient will perform SROM exercises with min assist   Skilled Therapeutic Interventions/Progress Updates:  Patient resting in bed upon arrival.  Engaged in self care retraining to include shower, dress and groom tasks.  Focused session on squat pivot transfers bed>w/c><tub bench, sit><stands, static and dynamic standing balance, RUE & RLE management and optimal positioning.  Patient requires max cues to prepare/place RLE to take weight for sit>stand.  Patient with pusher tendencies yet is able to shift weight with vcs and facilitation.  Therapy Documentation Precautions:  Precautions Precautions: Fall Precaution Comments: Rt hemiparesis  Restrictions Weight Bearing Restrictions: No Pain: Denies pain ADL: See FIM for current functional status  Therapy/Group: Individual Therapy  Yeny Schmoll, [place] 02/21/2014, 12:03 PM

## 2014-02-21 NOTE — Progress Notes (Signed)
CPAP set up at bedside, patient states he is able to place himself on/off as needed.

## 2014-02-21 NOTE — Progress Notes (Signed)
Orthopedic Tech Progress Note Patient Details:  Dakota Hall 12/05/1950 [id number]  Patient ID: Dakota Hall, male   DOB: 08/08/1951, 63 y.o.   MRN: [id number] [facility]  Kerrin Markman 02/21/2014, 5:03 PM

## 2014-02-21 NOTE — Progress Notes (Signed)
Physical Therapy Session Note  Patient Details  Name: [name] MRN: [id number] Date of Birth: 09/03/1951  Today's Date: 02/21/2014 Time:  -  9:03-10:00 (57min) and 13:02-13:32 (30min)     Skilled Therapeutic Interventions/Progress Updates:  1:2 Tx focused on LiteGait treadmill training for NMR and gait. Pt propelled WC Mod I to gym.  Set-up pt for passive HS and heel cord stretch 2x60' on bench with belt.  NMR during sit<>stands multiple reps with min A for steadying and verbal and tactile cues for R hip and knee ext. Pt unable to fully extend L knee, demonstating little dissociated movement.   Gait with R Bleu Rocker, shoe cover, R hand wrapped on grip and theraband to promote terminal hip ext.  1st trial 196' over 3:46sec at 0.6 mph. 2nd trial 400' over 4:43sec at 0.728mph Therapist assist gait with unweighting RLE and providing max A for hip and knee ext as well as R swing/placement 50% of the time. Pt unable to achieve equal step length due to inability to maintain R knee/hip ext in stance, thus quick L step timing.   Seated rest between trials. Pt able to actively extend L knee without shoe  2:2 Tx focused on NMR in tall kneeling and transfer training for safety and increased independence.   Performed standing at edge of mat > L knee on mat> R knees on mat with +2 for safety. Pt needed assist to lift R knee due to tone. Tall bench on mat for UE support.  Performed static balance, weight shifting, and sitting back on knees>tall kneeling tasks. Pt able to lift LUE off table with practice for increased forced-use of R hip extensors. Performed reaching tasks inside BOS.  Performed serial WC<>mat transfers for practice to increased independence with this task. Pt able to perform with min>min-guard A and min cues for technique.      Therapy Documentation Precautions:  Precautions Precautions: Fall Precaution Comments: Rt hemiparesis  Restrictions Weight Bearing Restrictions:  No General:   Vital Signs: Therapy Vitals Temp: 98 F (36.7 C) Temp src: Oral Pulse Rate: 52 Resp: 18 BP: 138/78 mmHg Patient Position, if appropriate: Lying Oxygen Therapy SpO2: 95 % O2 Device: None (Room air) Pain: none      Locomotion : Ambulation Ambulation/Gait Assistance: 1: +2 Total assist Wheelchair Mobility Distance: 200   See FIM for current functional status  Therapy/Group: Individual Therapy Clydene Lamingole Gianah Batt, PT, DPT  02/21/2014, 9:37 AM

## 2014-02-21 NOTE — Progress Notes (Signed)
Speech Language Pathology Daily Session Note  Patient Details  Name: Dakota Hall MRN: [id number] Date of Birth: 09/19/1951  Today's Date: 02/21/2014 Time: [id number] Time Calculation (min): 40 min  Short Term Goals: Week 2: SLP Short Term Goal 1 (Week 2): Pt will utilize speech intelligibility strategies at the sentence level with Supervision cues SLP Short Term Goal 2 (Week 2): Pt will demonstrate complex problem solving during functional task with Supervision level cues SLP Short Term Goal 3 (Week 2): Pt will utilize internal/external memory aids to facilitate working memory during functional tasks with Supervision level cues SLP Short Term Goal 4 (Week 2): Pt will utilize safe swallowing strategies with recommended textures with Mod I  Skilled Therapeutic Interventions: Skilled treatment session focused on addressing dysarthria goals.  SLP facilitated session with Supervision level verbal cues to utilize speech intelligibility strategies at the sentence-conversational level.  Lingual weakness appears to be most limiting factor and as a result SLP educated patient on lingual strengthening exercises.  Continue with current plan of care.    FIM:  Comprehension Comprehension Mode: Auditory Comprehension: 6-Follows complex conversation/direction: With extra time/assistive device Expression Expression Mode: Verbal Expression: 5-Expresses basic 90% of the time/requires cueing < 10% of the time. Social Interaction Social Interaction: 6-Interacts appropriately with others with medication or extra time (anti-anxiety, antidepressant). Problem Solving Problem Solving: 5-Solves complex 90% of the time/cues < 10% of the time Memory Memory: 5-Recognizes or recalls 90% of the time/requires cueing < 10% of the time  Pain Pain Assessment Pain Assessment: No/denies pain  Therapy/Group: Individual Therapy  Charlane FerrettiMelissa Keia Rask, M.A., CCC-SLP [phone]  Burl Tauzin 02/21/2014, 5:35 PM

## 2014-02-22 ENCOUNTER — Inpatient Hospital Stay (HOSPITAL_COMMUNITY): Payer: Managed Care, Other (non HMO) | Admitting: Speech Pathology

## 2014-02-22 ENCOUNTER — Inpatient Hospital Stay (HOSPITAL_COMMUNITY): Payer: Managed Care, Other (non HMO) | Admitting: Occupational Therapy

## 2014-02-22 ENCOUNTER — Inpatient Hospital Stay (HOSPITAL_COMMUNITY): Payer: Managed Care, Other (non HMO)

## 2014-02-22 ENCOUNTER — Inpatient Hospital Stay (HOSPITAL_COMMUNITY): Payer: Managed Care, Other (non HMO) | Admitting: *Deleted

## 2014-02-22 DIAGNOSIS — I69922 Dysarthria following unspecified cerebrovascular disease: Secondary | ICD-10-CM

## 2014-02-22 DIAGNOSIS — I69959 Hemiplegia and hemiparesis following unspecified cerebrovascular disease affecting unspecified side: Secondary | ICD-10-CM

## 2014-02-22 DIAGNOSIS — R1313 Dysphagia, pharyngeal phase: Secondary | ICD-10-CM

## 2014-02-22 DIAGNOSIS — I6330 Cerebral infarction due to thrombosis of unspecified cerebral artery: Secondary | ICD-10-CM

## 2014-02-22 DIAGNOSIS — I69991 Dysphagia following unspecified cerebrovascular disease: Secondary | ICD-10-CM

## 2014-02-22 DIAGNOSIS — E119 Type 2 diabetes mellitus without complications: Secondary | ICD-10-CM

## 2014-02-22 LAB — GLUCOSE, CAPILLARY
GLUCOSE-CAPILLARY: 118 mg/dL — AB (ref 70–99)
GLUCOSE-CAPILLARY: 118 mg/dL — AB (ref 70–99)
GLUCOSE-CAPILLARY: 140 mg/dL — AB (ref 70–99)
Glucose-Capillary: 143 mg/dL — ABNORMAL HIGH (ref 70–99)

## 2014-02-22 MED ORDER — BACLOFEN 20 MG PO TABS
20.0000 mg | ORAL_TABLET | Freq: Three times a day (TID) | ORAL | Status: DC
  Administered 2014-02-22 – 2014-02-25 (×9): 20 mg via ORAL
  Filled 2014-02-22 (×12): qty 1

## 2014-02-22 NOTE — Progress Notes (Signed)
Occupational Therapy Session Notes  Patient Details  Name: Dionne AnoRoger W Ehmke MRN: [id number] Date of Birth: 09/11/1951  Today's Date: 02/22/2014 Time: 1000-1100 and 105-135 Time Calculation (min): 60 min and 30 min  Short Term Goals: Week 2:  OT Short Term Goal 1 (Week 2): Patient will stand at sink with min assist to allow for LB dressing OT Short Term Goal 2 (Week 2): Patient will be a able to perform hygiene while seated on toilet with steady assist OT Short Term Goal 3 (Week 2): Patient will manage RUE during all mobility and BADL tasks with no more than 2 vcs per session. OT Short Term Goal 4 (Week 2): Patient will perform SROM exercises with min assist   Skilled Therapeutic Interventions/Progress Updates:  1)  Patient resting in w/c upon arrival, engaged in self care retraining to include sponge bath seated in w/c with water basin on bedside table secondary to patient does not think w/c will not fit in half bath for him to sit in w/c in front of sink.  Patient required more assist for sit><stand and standing balance secondary to he did not have a sink to hold on to or to pull up from.  Addressed RUE SROM exercises, positioning and how to don and doff his resting hand splint that he received yesterday.  Also addressed sit><stands with focus on RLE assisting more with the transitional movement and to sustain activation while in standing and in squat and during lateral weight shifts especially to his right.  Patient requires verbal and facilitation cues not to overuse his strong left side.  2)  Patient resting in w/c upon arrival and proudly stated, "I achieved one of my goals", "I cleaned myself after a BM today sitting on the commode".  Celebrated with patient and provide encouragement.  Practiced more sit><stand and standing balance while activating RLE during weight shifts in standing.  Notes decreased tone in RUE with weight bearing through RLE.  In therapy gym, patient performed squat pivot  transfer w/c>mat and engaged in bed mobility, SROM exercises and bed positioning.  Patient left on mat in left sidelying and waiting his next scheduled therapy.  Therapy Documentation Precautions:  Precautions Precautions: Fall Precaution Comments: Rt hemiparesis  Restrictions Weight Bearing Restrictions: No Pain: Denies pain in either session ADL: See FIM for current functional status  Therapy/Group: Individual Therapy both sessions  Fredia BeetsChristina K Tommy Minichiello 02/22/2014, 2:16 PM

## 2014-02-22 NOTE — Progress Notes (Signed)
Pt states he is able to put self on CPAP. Will call RT is assistance needed.

## 2014-02-22 NOTE — Progress Notes (Signed)
The skilled treatment note has been reviewed and SLP is in agreement.  Skylarr Liz, M.A., CCC-SLP  [phone]   

## 2014-02-22 NOTE — Progress Notes (Signed)
Recreational Therapy Session Note  Patient Details  Name: Dakota Hall Risko MRN: [id number] Date of Birth: 02/22/1951 Today's Date: 02/22/2014  Pain: no c/o Skilled Therapeutic Interventions/Progress Updates: Session focused on identifying positive outcomes of today's events & speech intelligeable speech.  Out & about on hospital grounds/outside per pt request with focus on discussion of today's accomplishments & therapy goals.  Pt required min questioning cues for intelligeable speech.  Therapy/Group: Individual Therapy Clois DupesLisa B Cindi Ghazarian 02/22/2014, 4:49 PM

## 2014-02-22 NOTE — Discharge Instructions (Signed)
Inpatient Rehab Discharge Instructions  Dakota Hall Discharge date and time: No discharge date for patient encounter.   Activities/Precautions/ Functional Status: Activity: activity as tolerated Diet: diabetic diet Wound Care: none needed Functional status:  ___ No restrictions     ___ Walk up steps independently ___ 24/7 supervision/assistance   ___ Walk up steps with assistance ___ Intermittent supervision/assistance  ___ Bathe/dress independently ___ Walk with walker     ___ Bathe/dress with assistance ___ Walk Independently    ___ Shower independently ___ Walk with assistance    ___ Shower with assistance ___ No alcohol     ___ Return to work/school ________  Special Instructions: STROKE/TIA DISCHARGE INSTRUCTIONS SMOKING Cigarette smoking nearly doubles your risk of having a stroke & is the single most alterable risk factor  If you smoke or have smoked in the last 12 months, you are advised to quit smoking for your health.  Most of the excess cardiovascular risk related to smoking disappears within a year of stopping.  Ask you doctor about anti-smoking medications  [place] Quit Line: 1-800-QUIT NOW  Free Smoking Cessation Classes [phone]  CHOLESTEROL Know your levels; limit fat & cholesterol in your diet  Lipid Panel     Component Value Date/Time   CHOL 177 02/04/2014 0243   TRIG 94 02/04/2014 0243   HDL 45 02/04/2014 0243   CHOLHDL 3.9 02/04/2014 0243   VLDL 19 02/04/2014 0243   LDLCALC 113* 02/04/2014 0243      Many patients benefit from treatment even if their cholesterol is at goal.  Goal: Total Cholesterol (CHOL) less than 160  Goal:  Triglycerides (TRIG) less than 150  Goal:  HDL greater than 40  Goal:  LDL (LDLCALC) less than 100   BLOOD PRESSURE American Stroke Association blood pressure target is less that 120/80 mm/Hg  Your discharge blood pressure is:  BP: 135/75 mmHg  Monitor your blood pressure  Limit your salt and alcohol intake  Many  individuals will require more than one medication for high blood pressure  DIABETES (A1c is a blood sugar average for last 3 months) Goal HGBA1c is under 7% (HBGA1c is blood sugar average for last 3 months)  Diabetes:     Lab Results  Component Value Date   HGBA1C 8.1* 02/06/2014     Your HGBA1c can be lowered with medications, healthy diet, and exercise.  Check your blood sugar as directed by your physician  Call your physician if you experience unexplained or low blood sugars.  PHYSICAL ACTIVITY/REHABILITATION Goal is 30 minutes at least 4 days per week  Activity: No driving, Therapies: See above.  Return to work:  N/A  Activity decreases your risk of heart attack and stroke and makes your heart stronger.  It helps control your weight and blood pressure; helps you relax and can improve your mood.  Participate in a regular exercise program.  Talk with your doctor about the best form of exercise for you (dancing, walking, swimming, cycling).  DIET/WEIGHT Goal is to maintain a healthy weight  Your discharge diet is: Carb Control thin liquids Your height is:   Your current weight is: Weight: 108.9 kg (240 lb 1.3 oz) Your Body Mass Index (BMI) is:    Following the type of diet specifically designed for you will help prevent another stroke.  Your goal weight  is:    Your goal Body Mass Index (BMI) is 19-24.  Healthy food habits can help reduce 3 risk factors for stroke:  High  cholesterol, hypertension, and excess weight.  RESOURCES Stroke/Support Group:  Call [phone]   STROKE EDUCATION PROVIDED/REVIEWED AND GIVEN TO PATIENT Stroke warning signs and symptoms How to activate emergency medical system (call 911). Medications prescribed at discharge. Need for follow-up after discharge. Personal risk factors for stroke. Pneumonia vaccine given:  Flu vaccine given:  My questions have been answered, the writing is legible, and I understand these instructions.  I will adhere to  these goals & educational materials that have been provided to me after my discharge from the hospital.       My questions have been answered and I understand these instructions. I will adhere to these goals and the provided educational materials after my discharge from the hospital.  Patient/Caregiver Signature _______________________________ Date __________  Clinician Signature _______________________________________ Date __________  Please bring this form and your medication list with you to all your follow-up doctor's appointments.

## 2014-02-22 NOTE — Progress Notes (Signed)
Physical Therapy Weekly Progress Note  Patient Details  Name: Dakota Hall MRN: [id number] Date of Birth: 04/28/1951  Beginning of progress report period: February 16, 2014 End of progress report period: February 22, 2014  Today's Date: 02/22/2014 Time:0845-0945, 60 min;  1405-1435, 30 min Individual therapy No pain reported  Patient has met 3 of 4 short term goals.  He partly met the gait goal, but is limited in performance due to spasticity RUE and RLE.  Patient continues to demonstrate the following deficits:hypertonus RUE and RLE, motor control, balance, activity tolerance, affect and therefore will continue to benefit from skilled PT intervention to enhance overall performance with activity tolerance, balance, postural control, ability to compensate for deficits and functional use of  right upper extremity and right lower extremity.  Patient not progressing toward long term goals.  See goal revision..  Plan of care revisions: dowgraded gait LTGs to min assist.  PT Short Term Goals Week 2:  PT Short Term Goal 1 (Week 2): Pt will perform bed mobiltiy consistently with S PT Short Term Goal 1 - Progress (Week 2): Met PT Short Term Goal 2 (Week 2): Pt will perform 4/5 transfers with min A PT Short Term Goal 2 - Progress (Week 2): Met PT Short Term Goal 3 (Week 2): Pt will perform gait x25' with Mod A PT Short Term Goal 3 - Progress (Week 2): Partly met PT Short Term Goal 4 (Week 2): Pt will ascend/descend 3 stairs with Mod A PT Short Term Goal 4 - Progress (Week 2): Met  Skilled Therapeutic Interventions/Progress Updates:  Tx with Rehab Tech for +2 .  Stretching R hamstrings in supine before mobility.  Bed mobility with supervision, cues for technique.  Bed> w/c to R with min assist, squat pivot.neuromuscular re-education via forced use, visual feedback for RLE knee ext/flex in sitting, standing, Kinetron, L sidelying, gait, and R trunk ext and shoulder adduction in sitting.  Extensor  spasticity limits pt's ability to activate flexors.   Gait using railing on L in hallway with and without RAFO.  Pt had more difficulty progressing RLW when wearing AFO.  Gait x 20' x 2, mod/max assist.  Attempted EVA walker with +2, x 5' but pt unable to stabilize RLE well enough to feel confident.  RUE and trunk spasticity limited his ability to bear wt on RUE  in EVA.  W/c propulsion x 150' modified independent for activity tolerance.  Pt emotionally labile and criticizing himself due to his obesity. Conveyed to team including MD.  Tx 2:  neuromuscular re-education via VCS, demo, manual cues for movements needed for bed mobility- bil bridging, biased toward R, modified abdominal crunches, bil hip abd/add in hooklying position.  Up/down 3 (5") steps with L rail, mod assist for wt shifting, ACE on R LLE to assist with foot drop.  Assistance needed for R LE stance stability, max assist for technique.  Pt stepped once with LLE before stabilizing R hip and knee.  Step to method ascending and descending; descending backwards.    Therapy Documentation Precautions:  Precautions Precautions: Fall Precaution Comments: Rt hemiparesis  Restrictions Weight Bearing Restrictions: No   Pain: Pain Assessment Pain Assessment: No/denies pain   Locomotion : Ambulation Ambulation/Gait Assistance: 1: +2 Total assist Wheelchair Mobility Distance: 150       See FIM for current functional status  Therapy/Group: Individual Therapy  Frederic Jericho 02/22/2014, 4:12 PM

## 2014-02-22 NOTE — Progress Notes (Signed)
Speech Language Pathology Daily Session Note  Patient Details  Name: Dakota Hall MRN: [id number] Date of Birth: 08/19/1951  Today's Date: 02/22/2014 Time: [id number] Time Calculation (min): 25 min  Short Term Goals: Week 2: SLP Short Term Goal 1 (Week 2): Pt will utilize speech intelligibility strategies at the sentence level with Supervision cues SLP Short Term Goal 2 (Week 2): Pt will demonstrate complex problem solving during functional task with Supervision level cues SLP Short Term Goal 3 (Week 2): Pt will utilize internal/external memory aids to facilitate working memory during functional tasks with Supervision level cues SLP Short Term Goal 4 (Week 2): Pt will utilize safe swallowing strategies with recommended textures with Mod I  Skilled Therapeutic Interventions: Skilled therapeutic intervention addressed cognitive goals. Patient required Min multimodal cues for organization to complete complex medication management task. Patient able to recall function of medications for ~50% of medications. Patient's was ~90% intelligible at the sentence level throughout session. Continue with current plan of care.    FIM:  Comprehension Comprehension Mode: Auditory Comprehension: 6-Follows complex conversation/direction: With extra time/assistive device Expression Expression Mode: Verbal Expression: 5-Expresses basic 90% of the time/requires cueing < 10% of the time. Social Interaction Social Interaction: 6-Interacts appropriately with others with medication or extra time (anti-anxiety, antidepressant). Problem Solving Problem Solving: 5-Solves complex 90% of the time/cues < 10% of the time Memory Memory: 4-Recognizes or recalls 75 - 89% of the time/requires cueing 10 - 24% of the time FIM - Eating Eating Activity: 0: Activity did not occur  Pain Pain Assessment Pain Assessment: No/denies pain  Therapy/Group: Individual Therapy  Nancy NordmannChelsea Eddy Termine 02/22/2014, 3:46 PM

## 2014-02-22 NOTE — Patient Care Conference (Signed)
Inpatient RehabilitationTeam Conference and Plan of Care Update Date: 02/22/2014   Time: 10:35 AM    Patient Name: Dakota Hall      Medical Record Number: [id number]  Date of Birth: 12/19/1950 Sex: Male         Room/Bed: 4W22C/4W22C-01 Payor Info: Payor: [employer] / Plan: C[employer] MANAGED / Product Type: *No Product type* /    Admitting Diagnosis: L CVA  Admit Date/Time:  02/08/2014  5:19 PM Admission Comments: No comment available   Primary Diagnosis:  <principal problem not specified> Principal Problem: <principal problem not specified>  Patient Active Problem List   Diagnosis Date Noted  . CVA (cerebral infarction) 02/08/2014  . Depression with anxiety   . 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 Obesity   . Stroke 02/03/2014  . Unspecified cerebral artery occlusion with cerebral infarction 07/20/2013  . Stroke, small vessel 05/18/2012  . Slurred speech 05/15/2012  . HTN (hypertension) 05/15/2012  . Noncompliance with medication regimen 05/15/2012  . DM (diabetes mellitus) 05/15/2012  . Vision changes 05/15/2012    Expected Discharge Date: Expected Discharge Date: 03/07/14  Team Members Present: Physician leading conference: Dr. Claudette LawsAndrew [name] Social Worker Present: Amada JupiterLucy Merian Wroe, LCSW;Jenny Prevatt, LCSW Nurse Present: Chana Bodeeborah Sharp, RN PT Present: Wanda Plumparoline Cook, PT;Blair Hobble, PT OT Present: Scherrie NovemberKayla Perkinson, OT SLP Present: Fae PippinMelissa Bowie, SLP PPS Coordinator present : Tora DuckMarie Noel, RN, CRRN;Becky Henrene DodgeWindsor, PT     Current Status/Progress Goal Weekly Team Focus  Medical   constipation, spasticty   minimize spasticity  trial meds   Bowel/Bladder   Continent of bowel and bladder; LBM 4/4  Continent of bowel and bladder  Offer PRN stool softener/laxative as needed   Swallow/Nutrition/ Hydration   Regular textures and thin liquids Mod I  Mod I with least restrictive PO  education    ADL's   overall min-mod assist  supervision overall  NMR, heavy weight bearing on right side,  dynamic standing balance, postural control, safe bathroom transfers, patient/caregiver education   Mobility   Min A bed mob, min A transfers, mod/max A short distance gait, +2 stairs; Mod I WC  Supervision all transfers and gait; Mod I WC  Stretching, neuro re-ed and dissocciated movements, hip/knee ext, gait wtih RW, stairs   Communication   Mod cues for speech strategies  Mod I at sentence level  increase lingual strength and use of speech intelligilbity strategies; diagnostic treamtment of high level anomia    Safety/Cognition/ Behavioral Observations  Supervision for high level processing and retrieval   Mod I  use of memory aids and complex problem solving   Pain   Denies  </=2  Offer pain medication prior to therapy and as needed   Skin   Dry  No new skin breakdown  Continue to ensure patient boosts self/repositions while in bed or chair    Rehab Goals Patient on target to meet rehab goals: Yes *See Care Plan and progress notes for long and short-term goals.  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  Emotionally labile, however, already on psychtropic meds.  Upgrade of diet.  Concern he may not be on target for reaching supervision mobility goals.  May need to be w/c level when alone at home.  Revisions to Treatment Plan:  None   Continued  Need for Acute Rehabilitation Level of Care: The patient requires daily medical management by a physician with specialized training in physical medicine and rehabilitation for the following conditions: Daily direction of a multidisciplinary physical rehabilitation program to ensure safe treatment while eliciting the highest outcome that is of practical value to the patient.: Yes Daily medical management of patient stability for increased  activity during participation in an intensive rehabilitation regime.: Yes Daily analysis of laboratory values and/or radiology reports with any subsequent need for medication adjustment of medical intervention for : Neurological problems;Other  Amada Jupiter 02/22/2014, 6:41 PM

## 2014-02-22 NOTE — Progress Notes (Addendum)
Subjective/Complaints: 63 y.o. right-handed male with history of CVA x2, DM type 2, OSA, who was admitted 02/03/2014 with acute onset right-sided weakness, right facial weakness with difficulty speaking and was noted to have severely elevated BP requiring labetalol and nicardipine. CCT negative for acute changes. CTA head/neck with mild irregularity of Left PCA question thalamostriate type ischemia, no large vessel stenosis. Patient treated with TPA and MRI of the head 2x2.5 cm acute infarct left basal ganglia radiating to white matter tracts. MRA of the head with no major occlusion or stenosis. Echocardiogram ejection fraction 60% without emboli. Carotid Dopplers with no ICA stenosis. 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 Swallow evaluation done and showed evidence of pharyngeal dysphagia with delay in swallow and was placed on dysphagia 3, thin liquids  No BM x 4 days on Senna and Sorbitol, discussed with RN using bisacodyl tonite if no BM today Review of Systems -aphasic  Objective: Vital Signs: Blood pressure 135/75, pulse 46, temperature 97.6 F (36.4 C), temperature source Oral, resp. rate 17, weight 108.9 kg (240 lb 1.3 oz), SpO2 94.00%. No results found. Results for orders placed during the hospital encounter of 02/08/14 (from the past 72 hour(s))  GLUCOSE, CAPILLARY     Status: Abnormal   Collection Time    02/19/14 11:33 AM      Result Value Ref Range   Glucose-Capillary 125 (*) 70 - 99 mg/dL   Comment 1 Notify RN    GLUCOSE, CAPILLARY     Status: Abnormal   Collection Time    02/19/14  4:36 PM      Result Value Ref Range   Glucose-Capillary 115 (*) 70 - 99 mg/dL   Comment 1 Notify RN    GLUCOSE, CAPILLARY     Status: Abnormal   Collection Time    02/19/14  8:51 PM      Result Value Ref Range   Glucose-Capillary 142 (*) 70 - 99 mg/dL   Comment 1 Notify RN    GLUCOSE, CAPILLARY     Status: Abnormal   Collection Time     02/20/14  7:16 AM      Result Value Ref Range   Glucose-Capillary 117 (*) 70 - 99 mg/dL   Comment 1 Notify RN    GLUCOSE, CAPILLARY     Status: Abnormal   Collection Time    02/20/14 11:27 AM      Result Value Ref Range   Glucose-Capillary 108 (*) 70 - 99 mg/dL   Comment 1 Notify RN    GLUCOSE, CAPILLARY     Status: Abnormal   Collection Time    02/20/14  4:02 PM      Result Value Ref Range   Glucose-Capillary 141 (*) 70 - 99 mg/dL  GLUCOSE, CAPILLARY     Status: Abnormal   Collection Time    02/20/14  9:27 PM      Result Value Ref Range   Glucose-Capillary 163 (*) 70 - 99 mg/dL  GLUCOSE, CAPILLARY     Status: Abnormal   Collection Time    02/21/14  7:30 AM      Result Value Ref Range   Glucose-Capillary 131 (*) 70 - 99 mg/dL  GLUCOSE, CAPILLARY     Status: Abnormal   Collection Time    02/21/14 11:40 AM      Result Value Ref Range   Glucose-Capillary 113 (*) 70 - 99 mg/dL  GLUCOSE, CAPILLARY     Status:  Abnormal   Collection Time    02/21/14  4:19 PM      Result Value Ref Range   Glucose-Capillary 116 (*) 70 - 99 mg/dL  GLUCOSE, CAPILLARY     Status: Abnormal   Collection Time    02/21/14  8:49 PM      Result Value Ref Range   Glucose-Capillary 131 (*) 70 - 99 mg/dL   Comment 1 Notify RN    GLUCOSE, CAPILLARY     Status: Abnormal   Collection Time    02/22/14  7:36 AM      Result Value Ref Range   Glucose-Capillary 118 (*) 70 - 99 mg/dL     HEENT: normal Cardio: RRR Resp: CTA B/L and unlabored GI: BS positive and non distended Extremity:  Pulses positive and No Edema Skin:   Intact Neuro: Flat, Cranial Nerve II-XII normal, Normal Sensory, Abnormal Motor 0/5 in RUE except trace R grip, 2- to 2/5 R hip/knee ext synergy otherwise 0/5 and Tone:  Hypertonia 2-3/4 especially bicep, tricep, pec.  Hams/calf-1-2/4 Tone: Ashworth 3 FF Musc/Skel:  Normal Gen NAD   Assessment/Plan: 1. Functional deficits secondary to Left BG infarct with R spastic HP which  require 3+ hours per day of interdisciplinary therapy in a comprehensive inpatient rehab setting. Physiatrist is providing close team supervision and 24 hour management of active medical problems listed below. Physiatrist and rehab team continue to assess barriers to discharge/monitor patient progress toward functional and medical goals. Team conference today please see physician documentation under team conference tab, met with team face-to-face to discuss problems,progress, and goals. Formulized individual treatment plan based on medical history, underlying problem and comorbidities. FIM: FIM - Bathing Bathing Steps Patient Completed: Chest;Right Arm;Abdomen;Right upper leg;Left upper leg;Left lower leg (including foot);Front perineal area;Buttocks;Left Arm;Right lower leg (including foot) (used long sponge) Bathing: 4: Steadying assist  FIM - Upper Body Dressing/Undressing Upper body dressing/undressing steps patient completed: Thread/unthread right sleeve of pullover shirt/dresss;Thread/unthread left sleeve of pullover shirt/dress;Put head through opening of pull over shirt/dress;Pull shirt over trunk Upper body dressing/undressing: 5: Set-up assist to: Obtain clothing/put away FIM - Lower Body Dressing/Undressing Lower body dressing/undressing steps patient completed: Thread/unthread left pants leg;Thread/unthread right pants leg;Pull pants up/down;Don/Doff left shoe;Don/Doff left sock;Don/Doff right sock;Don/Doff right shoe;Fasten/unfasten right shoe;Fasten/unfasten left shoe Lower body dressing/undressing: 4: Steadying Assist  FIM - Toileting Toileting steps completed by patient: Adjust clothing prior to toileting;Performs perineal hygiene;Adjust clothing after toileting Toileting: 4: Steadying assist  FIM - Radio producer Devices: Grab bars Toilet Transfers: 4-From toilet/BSC: Min A (steadying Pt. > 75%);4-To toilet/BSC: Min A (steadying Pt. > 75%)  FIM -  Bed/Chair Transfer Bed/Chair Transfer Assistive Devices: Arm rests Bed/Chair Transfer: 4: Bed > Chair or W/C: Min A (steadying Pt. > 75%);4: Chair or W/C > Bed: Min A (steadying Pt. > 75%)  FIM - Locomotion: Wheelchair Distance: 200 Locomotion: Wheelchair: 6: Travels 150 ft or more, turns around, maneuvers to table, bed or toilet, negotiates 3% grade: maneuvers on rugs and over door sills independently FIM - Locomotion: Ambulation Locomotion: Ambulation Assistive Devices: Lite Gait Ambulation/Gait Assistance: 1: +2 Total assist Locomotion: Ambulation: 0: Activity did not occur  Comprehension Comprehension Mode: Auditory Comprehension: 6-Follows complex conversation/direction: With extra time/assistive device  Expression Expression Mode: Verbal Expression: 5-Expresses basic 90% of the time/requires cueing < 10% of the time.  Social Interaction Social Interaction: 6-Interacts appropriately with others with medication or extra time (anti-anxiety, antidepressant).  Problem Solving Problem Solving: 5-Solves complex 90% of  the time/cues < 10% of the time  Memory Memory: 5-Recognizes or recalls 90% of the time/requires cueing < 10% of the time   Medical Problem List and Plan:  1. 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  2. DVT Prophylaxis/Anticoagulation: Subcutaneous Lovenox. Monitor platelet counts any signs of bleeding  3. Pain Management: Tylenol as needed  4. H/o depression with anxiety/Mood: Stable at this time. LCSW to follow for evaluation. Continue Abilify 2.5 mg daily and Zoloft 150 mg daily. Will use ativan 0.5 mg tid prn for anxiety.  5. Neuropsych: This patient is capable of making decisions on his own behalf.  6. Diabetes mellitus: Hemoglobin A1c 8.1. Check blood sugars a.c. and at bedtime.  7. Hypertension: Will monitor BP with bid checks--continue Norvasc 10 mg daily, Bystolic 30m daily, Diovan 160 mg daily,  hydrochlorothiazide 12.5 mg daily. Monitor with increased mobility  8. Hyperlipidemia: On Zocor  9. Constipation: Will schedule Miralax for bowel program.  10. OSA: continue CPAP to help with sleep hygiene and energy levels.  11. Spasticity: continue to titrate baclofen , wrist hand orthosis on R 12.  Bradycardia off BB, recheck EKG  LOS (Days) 14 A FACE TO FACE EVALUATION WAS PERFORMED  ACharlett Blake4/06/2014, 8:35 AM

## 2014-02-23 ENCOUNTER — Inpatient Hospital Stay (HOSPITAL_COMMUNITY): Payer: Managed Care, Other (non HMO)

## 2014-02-23 ENCOUNTER — Inpatient Hospital Stay (HOSPITAL_COMMUNITY): Payer: Managed Care, Other (non HMO) | Admitting: Speech Pathology

## 2014-02-23 ENCOUNTER — Inpatient Hospital Stay (HOSPITAL_COMMUNITY): Payer: Managed Care, Other (non HMO) | Admitting: Occupational Therapy

## 2014-02-23 LAB — GLUCOSE, CAPILLARY
GLUCOSE-CAPILLARY: 118 mg/dL — AB (ref 70–99)
GLUCOSE-CAPILLARY: 124 mg/dL — AB (ref 70–99)
Glucose-Capillary: 130 mg/dL — ABNORMAL HIGH (ref 70–99)
Glucose-Capillary: 128 mg/dL — ABNORMAL HIGH (ref 70–99)

## 2014-02-23 NOTE — Progress Notes (Signed)
Subjective/Complaints: 63 y.o. right-handed male with history of CVA x2, DM type 2, OSA, who was admitted 02/03/2014 with acute onset right-sided weakness, right facial weakness with difficulty speaking and was noted to have severely elevated BP requiring labetalol and nicardipine. CCT negative for acute changes. CTA head/neck with mild irregularity of Left PCA question thalamostriate type ischemia, no large vessel stenosis. Patient treated with TPA and MRI of the head 2x2.5 cm acute infarct left basal ganglia radiating to white matter tracts. MRA of the head with no major occlusion or stenosis. Echocardiogram ejection fraction 60% without emboli. Carotid Dopplers with no ICA stenosis. 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 Swallow evaluation done and showed evidence of pharyngeal dysphagia with delay in swallow and was placed on dysphagia 3, thin liquids  No BM x 4 days on Senna and Sorbitol, discussed with RN using bisacodyl tonite if no BM today Review of Systems -aphasic  Objective: Vital Signs: Blood pressure 147/58, pulse 62, temperature 97.7 F (36.5 C), temperature source Oral, resp. rate 18, weight 108.9 kg (240 lb 1.3 oz), SpO2 97.00%. No results found. Results for orders placed during the hospital encounter of 02/08/14 (from the past 72 hour(s))  GLUCOSE, CAPILLARY     Status: Abnormal   Collection Time    02/20/14 11:27 AM      Result Value Ref Range   Glucose-Capillary 108 (*) 70 - 99 mg/dL   Comment 1 Notify RN    GLUCOSE, CAPILLARY     Status: Abnormal   Collection Time    02/20/14  4:02 PM      Result Value Ref Range   Glucose-Capillary 141 (*) 70 - 99 mg/dL  GLUCOSE, CAPILLARY     Status: Abnormal   Collection Time    02/20/14  9:27 PM      Result Value Ref Range   Glucose-Capillary 163 (*) 70 - 99 mg/dL  GLUCOSE, CAPILLARY     Status: Abnormal   Collection Time    02/21/14  7:30 AM      Result Value Ref  Range   Glucose-Capillary 131 (*) 70 - 99 mg/dL  GLUCOSE, CAPILLARY     Status: Abnormal   Collection Time    02/21/14 11:40 AM      Result Value Ref Range   Glucose-Capillary 113 (*) 70 - 99 mg/dL  GLUCOSE, CAPILLARY     Status: Abnormal   Collection Time    02/21/14  4:19 PM      Result Value Ref Range   Glucose-Capillary 116 (*) 70 - 99 mg/dL  GLUCOSE, CAPILLARY     Status: Abnormal   Collection Time    02/21/14  8:49 PM      Result Value Ref Range   Glucose-Capillary 131 (*) 70 - 99 mg/dL   Comment 1 Notify RN    GLUCOSE, CAPILLARY     Status: Abnormal   Collection Time    02/22/14  7:36 AM      Result Value Ref Range   Glucose-Capillary 118 (*) 70 - 99 mg/dL  GLUCOSE, CAPILLARY     Status: Abnormal   Collection Time    02/22/14 11:49 AM      Result Value Ref Range   Glucose-Capillary 143 (*) 70 - 99 mg/dL  GLUCOSE, CAPILLARY     Status: Abnormal   Collection Time    02/22/14  4:43 PM      Result Value Ref Range   Glucose-Capillary  118 (*) 70 - 99 mg/dL  GLUCOSE, CAPILLARY     Status: Abnormal   Collection Time    02/22/14  8:52 PM      Result Value Ref Range   Glucose-Capillary 140 (*) 70 - 99 mg/dL   Comment 1 Notify RN       HEENT: normal Cardio: RRR Resp: CTA B/L and unlabored GI: BS positive and non distended Extremity:  Pulses positive and No Edema Skin:   Intact Neuro: Flat, Cranial Nerve II-XII normal, Normal Sensory, Abnormal Motor 0/5 in RUE except trace R grip, 2- to 2/5 R hip/knee ext synergy otherwise 0/5 and Tone:  Hypertonia 2-3/4 especially bicep, tricep, pec.  Hams/calf-1-2/4 Tone: Ashworth 3 FF Musc/Skel:  Normal Gen NAD   Assessment/Plan: 1. Functional deficits secondary to Left BG infarct with R spastic HP which require 3+ hours per day of interdisciplinary therapy in a comprehensive inpatient rehab setting. Physiatrist is providing close team supervision and 24 hour management of active medical problems listed below. Physiatrist and  rehab team continue to assess barriers to discharge/monitor patient progress toward functional and medical goals.  FIM: FIM - Bathing Bathing Steps Patient Completed: Chest;Right Arm;Abdomen;Right upper leg;Left upper leg;Front perineal area;Buttocks;Left Arm Bathing: 4: Min-Patient completes [id number] 10 parts or 75+ percent  FIM - Upper Body Dressing/Undressing Upper body dressing/undressing steps patient completed: Thread/unthread right sleeve of pullover shirt/dresss;Thread/unthread left sleeve of pullover shirt/dress;Put head through opening of pull over shirt/dress;Pull shirt over trunk Upper body dressing/undressing: 5: Set-up assist to: Obtain clothing/put away FIM - Lower Body Dressing/Undressing Lower body dressing/undressing steps patient completed: Thread/unthread left pants leg;Thread/unthread right pants leg;Pull pants up/down;Don/Doff left shoe;Don/Doff right shoe;Fasten/unfasten right shoe;Fasten/unfasten left shoe Lower body dressing/undressing: 4: Steadying Assist (declined to pull off socks )  FIM - Toileting Toileting steps completed by patient: Adjust clothing prior to toileting;Performs perineal hygiene;Adjust clothing after toileting Toileting: 4: Steadying assist  FIM - Diplomatic Services operational officerToilet Transfers Toilet Transfers Assistive Devices: Grab bars Toilet Transfers: 4-From toilet/BSC: Min A (steadying Pt. > 75%);4-To toilet/BSC: Min A (steadying Pt. > 75%)  FIM - Bed/Chair Transfer Bed/Chair Transfer Assistive Devices: Arm rests Bed/Chair Transfer: 4: Bed > Chair or W/C: Min A (steadying Pt. > 75%);4: Chair or W/C > Bed: Min A (steadying Pt. > 75%)  FIM - Locomotion: Wheelchair Distance: 150 Locomotion: Wheelchair: 6: Travels 150 ft or more, turns around, maneuvers to table, bed or toilet, negotiates 3% grade: maneuvers on rugs and over door sills independently FIM - Locomotion: Ambulation Locomotion: Ambulation Assistive Devices: Fara BorosWalker - Eva Ambulation/Gait Assistance: 1: +2  Total assist Locomotion: Ambulation: 1: Two helpers  Comprehension Comprehension Mode: Auditory Comprehension: 6-Follows complex conversation/direction: With extra time/assistive device  Expression Expression Mode: Verbal Expression: 5-Expresses basic 90% of the time/requires cueing < 10% of the time.  Social Interaction Social Interaction: 6-Interacts appropriately with others with medication or extra time (anti-anxiety, antidepressant).  Problem Solving Problem Solving: 5-Solves complex 90% of the time/cues < 10% of the time  Memory Memory: 4-Recognizes or recalls 75 - 89% of the time/requires cueing 10 - 24% of the time   Medical Problem List and Plan:  1. 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  2. DVT Prophylaxis/Anticoagulation: Subcutaneous Lovenox. Monitor platelet counts any signs of bleeding  3. Pain Management: Tylenol as needed  4. H/o depression with anxiety/Mood: Stable at this time. LCSW to follow for evaluation. Continue Abilify 2.5 mg daily and Zoloft 150 mg daily. Will use  ativan 0.5 mg tid prn for anxiety.  5. Neuropsych: This patient is capable of making decisions on his own behalf.  6. Diabetes mellitus: Hemoglobin A1c 8.1. Check blood sugars a.c. and at bedtime.  7. Hypertension: Will monitor BP with bid checks--continue Norvasc 10 mg daily, Bystolic 5mg  daily, Diovan 160 mg daily, hydrochlorothiazide 12.5 mg daily. Monitor with increased mobility  8. Hyperlipidemia: On Zocor  9. Constipation: Will schedule Miralax for bowel program.  10. OSA: continue CPAP to help with sleep hygiene and energy levels.  11. Spasticity: continue to titrate baclofen, monitor for sedation , wrist hand orthosis on R 12.  Bradycardia off BB,  EKG-sinus brady  LOS (Days) 15 A FACE TO FACE EVALUATION WAS PERFORMED  Erick Colace 02/23/2014, 7:18 AM

## 2014-02-23 NOTE — Progress Notes (Signed)
Social Work Patient ID: Dakota Hall, male   DOB: 1951-03-27, 63 y.o.   MRN: [id number]  Met with pt yesterday to review team conference and called wife today to do the same.  Both aware targeted d/c date still for 4/21 with goals still of supervision to (possibly) minimal assistance.  Wife questions how long he might need to have someone with him for 24/7 care as she really cannot provide much more that 3-4 weeks.  Explained that I will need to follow up with his tx team about this concern and will get back with her.  She does note that they have relatives who live across the street and are not working who could check on him throughout the day.  Will discuss with team.  [name], LCSW

## 2014-02-23 NOTE — Progress Notes (Signed)
Occupational Therapy Session Note  Patient Details  Name: Dakota Hall MRN: [id number] Date of Birth: 08/28/1951  Today's Date: 02/23/2014 Time: 1000-1100 Time Calculation (min): 60 min  Short Term Goals: Week 2:  OT Short Term Goal 1 (Week 2): Patient will stand at sink with min assist to allow for LB dressing OT Short Term Goal 2 (Week 2): Patient will be a able to perform hygiene while seated on toilet with steady assist OT Short Term Goal 3 (Week 2): Patient will manage RUE during all mobility and BADL tasks with no more than 2 vcs per session. OT Short Term Goal 4 (Week 2): Patient will perform SROM exercises with min assist   Skilled Therapeutic Interventions/Progress Updates:  Patient resting in w/c upon arrival, engaged in self care retraining to include sponge bath seated in w/c with water basin on bedside table secondary to patient does not think w/c will not fit in half bath for him to sit in w/c in front of sink. Patient required more assist for sit><stand and standing balance secondary to he did not have a sink to hold on to or to pull up from. Addressed sit><stands with focus on RLE assisting more with the transitional movement and to sustain activation while in standing and in squat and during lateral weight shifts especially to his right. Patient practiced using RUE as stabilizer during set up for brushing teeth and stood at sink with min-mod assist secondary needed manual assist to discourage over use of LLE and shift weight to midline while maintaining muscle activation.  Patient requires verbal and facilitation cues not to overuse his strong left side.  Therapy Documentation Precautions:  Precautions Precautions: Fall Precaution Comments: Rt hemiparesis  Restrictions Weight Bearing Restrictions: No Pain: Denies pain ADL: See FIM for current functional status  Therapy/Group: Individual Therapy  Fredia BeetsChristina K Bayleigh Loflin 02/23/2014, 1:31 PM

## 2014-02-23 NOTE — Progress Notes (Signed)
Physical Therapy Note  Patient Details  Name: Dakota Hall MRN: [id number] Date of Birth: 01/07/1951 Today's Date: 02/23/2014 [id number], 25 min individual therapy; Missed 20 min tx due to bedside EKG; no new orders 1300-1400, 60 min individual therapy No pain reported AM or PM  Tx 1:  neuromuscular re-education via tactile cues, VCs for normal movement patterns for rolling in flat bed without rails, and L sidelying > sit; standing x 5 min with LUE support for self stretching R hamstrings and heel cord.  W/c propulsion modified independent x 200' with addition of w/c back cushion and wooden insert into w/c seat cushion to position pelvis better in w/c.  Tx 2:  neuromuscular re-education via demo, VCS, manual cues for: -Self stretching R hamstrings and heel cords in sitting with foot stool and strap, x 30 seconds x 3 -R shoulder add/elbow flex in unsupported sitting -gross movements with fisted hand to move small objects laterally on table in front of pt. -gait with railing on L in hallway, x 15' with mod assist, ACE on R foot for foot drop.  Gait with EVA +2 x 8' x 2 with standing rest breaks after each 4'.  Assistance needed for steering EVA, R stance stability, wt shifting. Pt c/o LLE fatigue after gait.  Pt overwhelmed wtih difficulty of gait, but did not cry today; emotional support provided.  Susa LofflerCaroline O Kana Reimann 02/23/2014, 7:52 AM

## 2014-02-23 NOTE — Progress Notes (Signed)
Pt can place on when ready for bed. Pt encouraged to call RT if needing any assistance.

## 2014-02-23 NOTE — Progress Notes (Signed)
Speech Language Pathology Weekly Progress and Session Note  Patient Details  Name: Dakota Hall MRN: [id number] Date of Birth: 1951-04-02  Beginning of progress report period: February 16, 2014 End of progress report period: February 23, 2014  Today's Date: 02/23/2014 Time: 1400-1430 Time Calculation (min): 30 min  Short Term Goals: Week 2: SLP Short Term Goal 1 (Week 2): Pt will utilize speech intelligibility strategies at the sentence level with Supervision cues SLP Short Term Goal 1 - Progress (Week 2): Met SLP Short Term Goal 2 (Week 2): Pt will demonstrate complex problem solving during functional task with Supervision level cues SLP Short Term Goal 2 - Progress (Week 2): Progressing toward goal SLP Short Term Goal 3 (Week 2): Pt will utilize internal/external memory aids to facilitate working memory during functional tasks with Supervision level cues SLP Short Term Goal 3 - Progress (Week 2): Progressing toward goal SLP Short Term Goal 4 (Week 2): Pt will utilize safe swallowing strategies with recommended textures with Mod I SLP Short Term Goal 4 - Progress (Week 2): Met    New Short Term Goals: Week 3: SLP Short Term Goal 1 (Week 3): Pt will utilize speech intelligibility strategies at the sentence level with Mod I SLP Short Term Goal 2 (Week 3): Pt will demonstrate complex problem solving during functional task with Supervision level cues SLP Short Term Goal 3 (Week 3): Pt will utilize internal/external memory aids to facilitate working memory during functional tasks with Supervision level cues  Weekly Progress Updates: Patient met 2 out of 4 short term goals during this reporting period due to gains noted in speech intelligibility and swallowing function. Patient is consuming regular textures and thin liquids with straw with Mod I. He is using his speech intelligibility strategies at the sentence level with Supervision cues, and is able to recall his strategies with Mod I and extra time.  Education is ongoing. Pt will benefit from continued SLP services to maximize safe completion of complex problem solving tasks and functional communication to maximize functional independence prior to discharge home with wife.   Intensity: Minumum of 1-2 x/day, 30 to 90 minutes Frequency: 5 out of 7 days Frequency due to:   Duration/Length of Stay: 4/21 Treatment/Interventions: Cognitive remediation/compensation;Cueing hierarchy;Dysphagia/aspiration precaution training;Environmental controls;Functional tasks;Internal/external aids;Medication managment;Oral motor exercises;Patient/family education;Speech/Language facilitation;Therapeutic Activities   Daily Session  Skilled Therapeutic Interventions: Skilled treatment session focused on addressing cognitive goals. Patient required Supervision level verbal cues faded to Mod I with use of an external aid to recall procedures of a new learning task.  Patient labeled 1 effective memory compensatory strategy during task.  Continue with current plan of care.        FIM:  Comprehension Comprehension Mode: Auditory Comprehension: 6-Follows complex conversation/direction: With extra time/assistive device Expression Expression Mode: Verbal Expression: 5-Expresses basic 90% of the time/requires cueing < 10% of the time. Social Interaction Social Interaction: 6-Interacts appropriately with others with medication or extra time (anti-anxiety, antidepressant). Problem Solving Problem Solving: 5-Solves complex 90% of the time/cues < 10% of the time Memory Memory: 4-Recognizes or recalls 75 - 89% of the time/requires cueing 10 - 24% of the time General    Pain Pain Assessment Pain Assessment: No/denies pain  Therapy/Group: Individual Therapy  Carmelia Roller., [place]  Mora Appl 02/23/2014, 4:01 PM

## 2014-02-24 ENCOUNTER — Inpatient Hospital Stay (HOSPITAL_COMMUNITY): Payer: Managed Care, Other (non HMO) | Admitting: *Deleted

## 2014-02-24 ENCOUNTER — Encounter (HOSPITAL_COMMUNITY): Payer: Managed Care, Other (non HMO)

## 2014-02-24 ENCOUNTER — Inpatient Hospital Stay (HOSPITAL_COMMUNITY): Payer: Managed Care, Other (non HMO) | Admitting: Speech Pathology

## 2014-02-24 DIAGNOSIS — I6330 Cerebral infarction due to thrombosis of unspecified cerebral artery: Secondary | ICD-10-CM

## 2014-02-24 DIAGNOSIS — I69959 Hemiplegia and hemiparesis following unspecified cerebrovascular disease affecting unspecified side: Secondary | ICD-10-CM

## 2014-02-24 DIAGNOSIS — R1313 Dysphagia, pharyngeal phase: Secondary | ICD-10-CM

## 2014-02-24 DIAGNOSIS — E119 Type 2 diabetes mellitus without complications: Secondary | ICD-10-CM

## 2014-02-24 DIAGNOSIS — I69922 Dysarthria following unspecified cerebrovascular disease: Secondary | ICD-10-CM

## 2014-02-24 DIAGNOSIS — I69991 Dysphagia following unspecified cerebrovascular disease: Secondary | ICD-10-CM

## 2014-02-24 LAB — GLUCOSE, CAPILLARY
GLUCOSE-CAPILLARY: 101 mg/dL — AB (ref 70–99)
Glucose-Capillary: 127 mg/dL — ABNORMAL HIGH (ref 70–99)
Glucose-Capillary: 103 mg/dL — ABNORMAL HIGH (ref 70–99)
Glucose-Capillary: 124 mg/dL — ABNORMAL HIGH (ref 70–99)

## 2014-02-24 NOTE — Progress Notes (Signed)
Physical Therapy Session Note  Patient Details  Name: Dakota Hall MRN: [id number] Date of Birth: 03/21/1951  Today's Date: 02/24/2014 Time:  8:10-8:30 (10min missed due to breakfast, co-treat with PT for +2 assist 8:10-9:00 total time)  And 13:00-13:45 (45min)    Skilled Therapeutic Interventions/Progress Updates:  1:2 Tx focused on gait training with LiteGait and stairs.  NMR provided throughout via manual facilitation and multi-modal cues during transfers, static standing while donning harness, and for RLE seated knee ext.  Pt in bed, performed bed mobiltiy with S cues and increased time/effort. Unable to completely roll, relying on some head/trunk ext and hooking strong leg.   Min A transfer with decreased control of descent into chair Mod I WC to ortho gym  Lite Gait trials x2 with +3 assist - 1st x863min at 72186' 0.536mph 2nd x606min at386' 0.677mph Facilitation provided via sheet around hips for terminal hip ext, RUE fastened to grip, weight shifting, and RLE advancement, heel strike and alignment. Focused on increased R-sided weight bearing to reduce R flexor tone in stance and removed LUE support during second trial to elicit natural swing. Pt needed standing rest break due to fatigue.   Passive HS and heel cord stretch for functional muscle length on R.   Stairs with +2 needed for RLE advancment and balance. Ascended/descended 4 stairs, L rail, step-to pattern up forwards, down backwards. Pt had several instances of bil knees flexing, decreased support, needing assist to recover. Likely due to fatigue.   2:2 Tx focused on NMR via quadruped, transfers, and gait with EVA walker. RLE extensor activation seems to be limited by quad weakness, but pt was able to actively engage quad in semi-extended position with visual cues x20 during tx, with muscle belly quivering noted with exertion. Pt pleased and provided HEP handout to continue strengthening over weekend.   Pt encouraged to increase  indep [facility] as well as RLE stretching program. He did continue to need cues for getting strap, step, etc. As well as assist for proper form. Performed stretching 2x45sec.   Performed transfer with Min A and +2 for standing from mat to turn around and approach for raising knees into tall kneeling>quadruped.  Performed weight shifting and LUE reaching task in quadruped with +2 for balance. Pt able to support self on RUE, but continually returned to sitting back on heels for increased support during challenges.   Performed gait with Carley HammedEva walker with mirror for feedback x12' with +2. Assist needed for weight-shifting, RLE placement and knee control in stance. Gait marked by decreased L step length, decreased R hip and knee ext in stance, and narrow BOS.   Performed pre-gait R terminal knee ext in stance with mirror in weighted position.  Pt given quad setting and LAQ HEP for weekend.      Therapy Documentation Precautions:  Precautions Precautions: Fall Precaution Comments: Rt hemiparesis  Restrictions Weight Bearing Restrictions: No    Vital Signs: Therapy Vitals Temp: 98.3 F (36.8 C) Temp src: Oral Pulse Rate: 48 Resp: 18 BP: 160/75 mmHg Patient Position, if appropriate: Lying Oxygen Therapy SpO2: 98 % O2 Device: CPAP  Pain: none each session     See FIM for current functional status  Therapy/Group: Individual Therapy Clydene Lamingole Lona Six, PT, DPT   02/24/2014, 8:52 AM

## 2014-02-24 NOTE — Progress Notes (Signed)
Occupational Therapy Session Note  Patient Details  Name: Dakota Hall MRN: [id number] Date of Birth: 10/17/1951  Today's Date: 02/24/2014 Time: [id number] Time Calculation (min): 48 min  Short Term Goals: Week 2:  OT Short Term Goal 1 (Week 2): Patient will stand at sink with min assist to allow for LB dressing OT Short Term Goal 2 (Week 2): Patient will be a able to perform hygiene while seated on toilet with steady assist OT Short Term Goal 3 (Week 2): Patient will manage RUE during all mobility and BADL tasks with no more than 2 vcs per session. OT Short Term Goal 4 (Week 2): Patient will perform SROM exercises with min assist   Skilled Therapeutic Interventions/Progress Updates:    Pt seen for ADL retraining with focus on functional transfers, standing balance, and attention to right. Pt received sitting in w/c and ready for therapy session. Completed bathing at sink with pt demonstrating good carryover of hemi techniques. Therapist provided HOH to use RUE to wash lower LUE. Completed sit<>stand at sink with min assist standing balance and occasional mod when standing unsupported. Engaged in weight shifting to right for increasing weight bearing through RLE. Pt with good carryover of hemi dressing technique, however required cues for rest breaks secondary to SOB. Practiced squat pivot transfer w/c<>toilet x2 with min assist and use of grab bars. Pt required min cues for setup of transfer. Pt then completed squat pivot transfer x2 bed<>w/ce at min-mod secondary to fatigue. Pt required min cues for positioning of RLE/RUE during transfers. At end of session pt left sitting in w/c with lap tray placed and pt initiating placement of RUE. All needs in reach.   Therapy Documentation Precautions:  Precautions Precautions: Fall Precaution Comments: Rt hemiparesis  Restrictions Weight Bearing Restrictions: No General: General Missed Time Reason: Other (comment) Vital Signs:   Pain: No report  of pain during therapy session.   See FIM for current functional status  Therapy/Group: Individual Therapy  Dakota Hall 02/24/2014, 11:57 AM

## 2014-02-24 NOTE — Progress Notes (Signed)
Physical Therapy Session Note  Patient Details  Name: Dakota Hall MRN: [id number] Date of Birth: 07/27/1951  Today's Date: 02/24/2014 Time: 0830-0900 (Co-tx with CK; entire session from 8:08-899) Time Calculation (min): 30 min  Short Term Goals: Week 3:     Skilled Therapeutic Interventions/Progress Updates:    Skilled physical therapy co-tx with primary PT (CK). Session focused on increased RLE weightbearing during gait training to address RLE tone and facilitation gait normalization. This PT provided the following assistance with gait training using LiteGait and treadmill: utilized sheet to provide P/A force at posterior aspect of bilat hips to promote bilat hip extension throughout gait cycle; provided tactile cueing at distal R quadriceps (focus on VMO) to promote R terminal knee extension at initial contact and at R quadriceps throughout stance phase for stance stability. Additional PT positioned at pt's R side providing manual facilitation at distal R hamstrings and ankle dorsiflexors during swing phase to increase R foot clearance.  See primary PT (CK) note for further details and specific parameters of LiteGait and treadmill. Provided +2A during stair negotiation for RLE advancement, for R knee stability to control R knee buckling secondary to pt fatigue.   Therapy Documentation Precautions:  Precautions Precautions: Fall Precaution Comments: Rt hemiparesis  Restrictions Weight Bearing Restrictions: No Vital Signs: Therapy Vitals Temp: 97.4 F (36.3 C) Temp src: Oral Pulse Rate: 54 Resp: 20 BP: 107/66 mmHg Patient Position, if appropriate: Sitting Oxygen Therapy SpO2: 94 % O2 Device: None (Room air) Pain: Pain Assessment Pain Assessment: No/denies pain  See FIM for current functional status  Therapy/Group: Co-Treatment  Carlena SaxBlair A Hobble 02/24/2014, 6:52 PM

## 2014-02-24 NOTE — Progress Notes (Signed)
Speech Language Pathology Daily Session Note  Patient Details  Name: Dakota Hall MRN: [id number] Date of Birth: 05/27/1951  Today's Date: 02/24/2014 Time: 1330-1400 Time Calculation (min): 30 min  Short Term Goals: Week 3: SLP Short Term Goal 1 (Week 3): Pt will utilize speech intelligibility strategies at the sentence level with Mod I SLP Short Term Goal 2 (Week 3): Pt will demonstrate complex problem solving during functional task with Supervision level cues SLP Short Term Goal 3 (Week 3): Pt will utilize internal/external memory aids to facilitate working memory during functional tasks with Supervision level cues  Skilled Therapeutic Interventions: Skilled treatment session focused on addressing cognitive goals. Patient required Supervision level verbal cues to recall and utilize procedures of a new learning task.  Patient utilized association strategies troughout task with Min assist.  Continue with current plan of care.   FIM:  Comprehension Comprehension Mode: Auditory Comprehension: 5-Understands complex 90% of the time/Cues < 10% of the time Expression Expression Mode: Verbal Expression: 5-Expresses basic 90% of the time/requires cueing < 10% of the time. Social Interaction Social Interaction: 6-Interacts appropriately with others with medication or extra time (anti-anxiety, antidepressant). Problem Solving Problem Solving: 5-Solves basic problems: With no assist Memory Memory: 5-Recognizes or recalls 90% of the time/requires cueing < 10% of the time  Pain Pain Assessment Pain Assessment: No/denies pain  Therapy/Group: Individual Therapy  Ophelia ShoulderMelissa M Charnell Peplinski 02/24/2014, 4:19 PM

## 2014-02-24 NOTE — Progress Notes (Signed)
Subjective/Complaints: 63 y.o. right-handed male with history of CVA x2, DM type 2, OSA, who was admitted 02/03/2014 with acute onset right-sided weakness, right facial weakness with difficulty speaking and was noted to have severely elevated BP requiring labetalol and nicardipine. CCT negative for acute changes. CTA head/neck with mild irregularity of Left PCA question thalamostriate type ischemia, no large vessel stenosis. Patient treated with TPA and MRI of the head 2x2.5 cm acute infarct left basal ganglia radiating to white matter tracts. MRA of the head with no major occlusion or stenosis. Echocardiogram ejection fraction 60% without emboli. Carotid Dopplers with no ICA stenosis. 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 Swallow evaluation done and showed evidence of pharyngeal dysphagia with delay in swallow and was placed on dysphagia 3, thin liquids Had bm yesterday No new problemns Review of Systems -aphasic/ dysarthric  Objective: Vital Signs: Blood pressure 160/75, pulse 48, temperature 98.3 F (36.8 C), temperature source Oral, resp. rate 18, weight 106.2 kg (234 lb 2.1 oz), SpO2 98.00%. No results found. Results for orders placed during the hospital encounter of 02/08/14 (from the past 72 hour(s))  GLUCOSE, CAPILLARY     Status: Abnormal   Collection Time    02/21/14  7:30 AM      Result Value Ref Range   Glucose-Capillary 131 (*) 70 - 99 mg/dL  GLUCOSE, CAPILLARY     Status: Abnormal   Collection Time    02/21/14 11:40 AM      Result Value Ref Range   Glucose-Capillary 113 (*) 70 - 99 mg/dL  GLUCOSE, CAPILLARY     Status: Abnormal   Collection Time    02/21/14  4:19 PM      Result Value Ref Range   Glucose-Capillary 116 (*) 70 - 99 mg/dL  GLUCOSE, CAPILLARY     Status: Abnormal   Collection Time    02/21/14  8:49 PM      Result Value Ref Range   Glucose-Capillary 131 (*) 70 - 99 mg/dL   Comment 1 Notify RN     GLUCOSE, CAPILLARY     Status: Abnormal   Collection Time    02/22/14  7:36 AM      Result Value Ref Range   Glucose-Capillary 118 (*) 70 - 99 mg/dL  GLUCOSE, CAPILLARY     Status: Abnormal   Collection Time    02/22/14 11:49 AM      Result Value Ref Range   Glucose-Capillary 143 (*) 70 - 99 mg/dL  GLUCOSE, CAPILLARY     Status: Abnormal   Collection Time    02/22/14  4:43 PM      Result Value Ref Range   Glucose-Capillary 118 (*) 70 - 99 mg/dL  GLUCOSE, CAPILLARY     Status: Abnormal   Collection Time    02/22/14  8:52 PM      Result Value Ref Range   Glucose-Capillary 140 (*) 70 - 99 mg/dL   Comment 1 Notify RN    GLUCOSE, CAPILLARY     Status: Abnormal   Collection Time    02/23/14  7:36 AM      Result Value Ref Range   Glucose-Capillary 130 (*) 70 - 99 mg/dL   Comment 1 Notify RN    GLUCOSE, CAPILLARY     Status: Abnormal   Collection Time    02/23/14 11:58 AM      Result Value Ref Range   Glucose-Capillary 118 (*) 70 - 99  mg/dL   Comment 1 Notify RN    GLUCOSE, CAPILLARY     Status: Abnormal   Collection Time    02/23/14  4:33 PM      Result Value Ref Range   Glucose-Capillary 128 (*) 70 - 99 mg/dL   Comment 1 Notify RN    GLUCOSE, CAPILLARY     Status: Abnormal   Collection Time    02/23/14  8:40 PM      Result Value Ref Range   Glucose-Capillary 124 (*) 70 - 99 mg/dL   Comment 1 Notify RN       HEENT: normal Cardio: RRR Resp: CTA B/L and unlabored GI: BS positive and non distended Extremity:  Pulses positive and No Edema Skin:   Intact Neuro: Flat, Cranial Nerve II-XII normal, Normal Sensory, Abnormal Motor 0/5 in RUE except trace R grip, 2- to 2/5 R hip/knee ext synergy otherwise 0/5 and Tone:  Hypertonia 2-3/4 especially bicep, tricep, pec.  Hams/calf-1-2/4 Tone: [place] 3 FF Musc/Skel:  Normal Gen NAD   Assessment/Plan: 1. Functional deficits secondary to Left BG infarct with R spastic HP which require 3+ hours per day of interdisciplinary  therapy in a comprehensive inpatient rehab setting. Physiatrist is providing close team supervision and 24 hour management of active medical problems listed below. Physiatrist and rehab team continue to assess barriers to discharge/monitor patient progress toward functional and medical goals.  FIM: FIM - Bathing Bathing Steps Patient Completed: Chest;Right Arm;Abdomen;Right upper leg;Left upper leg;Front perineal area;Buttocks;Left Arm Bathing: 4: Min-Patient completes 8-9 [id number] 10 parts or 75+ percent  FIM - Upper Body Dressing/Undressing Upper body dressing/undressing steps patient completed: Thread/unthread right sleeve of pullover shirt/dresss;Thread/unthread left sleeve of pullover shirt/dress;Put head through opening of pull over shirt/dress;Pull shirt over trunk Upper body dressing/undressing: 5: Set-up assist to: Obtain clothing/put away FIM - Lower Body Dressing/Undressing Lower body dressing/undressing steps patient completed: Thread/unthread left pants leg;Thread/unthread right pants leg;Pull pants up/down;Don/Doff left shoe;Don/Doff right shoe;Fasten/unfasten right shoe;Fasten/unfasten left shoe Lower body dressing/undressing: 4: Steadying Assist (declined to pull off socks )  FIM - Toileting Toileting steps completed by patient: Adjust clothing prior to toileting;Performs perineal hygiene;Adjust clothing after toileting Toileting: 4: Steadying assist  FIM - Diplomatic Services operational officerToilet Transfers Toilet Transfers Assistive Devices: Grab bars Toilet Transfers: 4-From toilet/BSC: Min A (steadying Pt. > 75%);4-To toilet/BSC: Min A (steadying Pt. > 75%)  FIM - Bed/Chair Transfer Bed/Chair Transfer Assistive Devices: Arm rests Bed/Chair Transfer: 4: Bed > Chair or W/C: Min A (steadying Pt. > 75%);4: Chair or W/C > Bed: Min A (steadying Pt. > 75%);5: Supine > Sit: Supervision (verbal cues/safety issues)  FIM - Locomotion: Wheelchair Distance: 150 Locomotion: Wheelchair: 6: Travels 150 ft or more, turns  around, maneuvers to table, bed or toilet, negotiates 3% grade: maneuvers on rugs and over door sills independently FIM - Locomotion: Ambulation Locomotion: Ambulation Assistive Devices: Fara BorosWalker - Eva Ambulation/Gait Assistance: 1: +2 Total assist Locomotion: Ambulation: 1: Two helpers  Comprehension Comprehension Mode: Auditory Comprehension: 6-Follows complex conversation/direction: With extra time/assistive device  Expression Expression Mode: Verbal Expression: 5-Expresses basic 90% of the time/requires cueing < 10% of the time.  Social Interaction Social Interaction: 6-Interacts appropriately with others with medication or extra time (anti-anxiety, antidepressant).  Problem Solving Problem Solving: 5-Solves complex 90% of the time/cues < 10% of the time  Memory Memory: 4-Recognizes or recalls 75 - 89% of the time/requires cueing 10 - 24% of the time   Medical Problem List and Plan:  1. 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  2. DVT Prophylaxis/Anticoagulation: Subcutaneous Lovenox. Monitor platelet counts any signs of bleeding  3. Pain Management: Tylenol as needed  4. H/o depression with anxiety/Mood: Stable at this time. LCSW to follow for evaluation. Continue Abilify 2.5 mg daily and Zoloft 150 mg daily. Will use ativan 0.5 mg tid prn for anxiety.  5. Neuropsych: This patient is capable of making decisions on his own behalf.  6. Diabetes mellitus: Hemoglobin A1c 8.1. Check blood sugars a.c. and at bedtime.  7. Hypertension: Will monitor BP with bid checks--continue Norvasc 10 mg daily, Bystolic 5mg  daily, Diovan 160 mg daily, hydrochlorothiazide 12.5 mg daily. Monitor with increased mobility  8. Hyperlipidemia: On Zocor  9. Constipation: Will schedule Miralax for bowel program.  10. OSA: continue CPAP to help with sleep hygiene and energy levels.  11. Spasticity: improved on increased baclofen, monitor for sedation  , wrist hand orthosis on R 12.  Bradycardia off BB,  EKG-sinus brady  LOS (Days) 16 A FACE TO FACE EVALUATION WAS PERFORMED  Erick Colace 02/24/2014, 7:19 AM

## 2014-02-25 ENCOUNTER — Inpatient Hospital Stay (HOSPITAL_COMMUNITY): Payer: Managed Care, Other (non HMO) | Admitting: Physical Therapy

## 2014-02-25 LAB — GLUCOSE, CAPILLARY
GLUCOSE-CAPILLARY: 124 mg/dL — AB (ref 70–99)
Glucose-Capillary: 109 mg/dL — ABNORMAL HIGH (ref 70–99)
Glucose-Capillary: 126 mg/dL — ABNORMAL HIGH (ref 70–99)
Glucose-Capillary: 105 mg/dL — ABNORMAL HIGH (ref 70–99)

## 2014-02-25 MED ORDER — BACLOFEN 10 MG PO TABS
10.0000 mg | ORAL_TABLET | Freq: Four times a day (QID) | ORAL | Status: DC
  Administered 2014-02-25 – 2014-03-01 (×16): 10 mg via ORAL
  Filled 2014-02-25 (×20): qty 1

## 2014-02-25 NOTE — Progress Notes (Signed)
Dakota Hall is a 63 y.o. male 05/05/1951 [id number]  Subjective: C/o being too sleepy on Baclofen. Slept well. Feeling OK.  Objective: Vital signs in last 24 hours: Temp:  [97.4 F (36.3 C)-97.7 F (36.5 C)] 97.7 F (36.5 C) (04/11 0456) Pulse Rate:  [54] 54 (04/11 0456) Resp:  [18-20] 18 (04/11 0456) BP: (107-181)/(66-82) 181/82 mmHg (04/11 0456) SpO2:  [94 %-97 %] 97 % (04/11 0456) Weight change:  Last BM Date: 02/24/14  Intake/Output from previous day: 04/10 0701 - 04/11 0700 In: 600 [P.O.:600] Out: 225 [Urine:225] Last cbgs: CBG (last 3)   Recent Labs  02/24/14 1622 02/24/14 2100 02/25/14 0654  GLUCAP 103* 124* 109*     Physical Exam General: No apparent distress  Eating breakfast HEENT: not dry Lungs: Normal effort. Lungs clear to auscultation, no crackles or wheezes. Cardiovascular: Regular rate and rhythm, no edema Abdomen: S/NT/ND; BS(+) Musculoskeletal:  unchanged Neurological: No new neurological deficits Wounds: N/A    Skin: clear  Aging changes Mental state: Alert, oriented, cooperative     Lab Results: BMET    Component Value Date/Time   NA 143 02/13/2014 0733   K 3.5* 02/13/2014 0733   CL 105 02/13/2014 0733   CO2 23 02/13/2014 0733   GLUCOSE 147* 02/13/2014 0733   BUN 28* 02/13/2014 0733   CREATININE 1.15 02/13/2014 0733   CALCIUM 9.6 02/13/2014 0733   GFRNONAA 66* 02/13/2014 0733   GFRAA 77* 02/13/2014 0733   CBC    Component Value Date/Time   WBC 8.6 02/09/2014 0550   RBC 5.01 02/09/2014 0550   HGB 16.0 02/09/2014 0550   HCT 43.6 02/09/2014 0550   PLT 194 02/09/2014 0550   MCV 87.0 02/09/2014 0550   MCH 31.9 02/09/2014 0550   MCHC 36.7* 02/09/2014 0550   RDW 13.0 02/09/2014 0550   LYMPHSABS 3.1 02/09/2014 0550   MONOABS 0.6 02/09/2014 0550   EOSABS 0.2 02/09/2014 0550   BASOSABS 0.0 02/09/2014 0550    Studies/Results: No results found.  Medications: I have reviewed the patient's current medications.  Assessment/Plan:   1. 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  2. DVT Prophylaxis/Anticoagulation: Subcutaneous Lovenox. Monitor platelet counts any signs of bleeding  3. Pain Management: Tylenol as needed  4. H/o depression with anxiety/Mood: Stable at this time. LCSW to follow for evaluation. Continue Abilify 2.5 mg daily and Zoloft 150 mg daily. Will use ativan 0.5 mg tid prn for anxiety.  5. Neuropsych: This patient is capable of making decisions on his own behalf.  6. Diabetes mellitus: Hemoglobin A1c 8.1. Check blood sugars a.c. and at bedtime. Cont Rx 7. Hypertension: Will monitor BP with bid checks--continue Norvasc 10 mg daily, Bystolic 5mg  daily, Diovan 160 mg daily, hydrochlorothiazide 12.5 mg daily. Monitor with increased mobility  8. Hyperlipidemia: On Zocor  9. Constipation: Will schedule Miralax for bowel program.  10. OSA: continue CPAP to help with sleep hygiene and energy levels.  11. Spasticity:changed  Baclofen to 10 mg qid. Splinting may be required as well.  12. Bradycardia. D/c BB      Length of stay, days: 17  Tresa GarterAleksei V Plotnikov , MD 02/25/2014, 9:57 AM

## 2014-02-25 NOTE — Progress Notes (Addendum)
Physical Therapy Session Note  Patient Details  Name: Dionne AnoRoger W Morten MRN: [id number] Date of Birth: 01/09/1951  Today's Date: 02/25/2014 Time: [id number] Time Calculation (min): 35 min  Skilled Therapeutic Interventions/Progress Updates:    Session focused on dynamic balance during gait and single limb stance for ambulation. Cone taps bil LEs with PT facilitating Rt LE control during Rt SLS. EVA walker for UE support. Ambulation 2 x 15' with EVA walker and +2 assist (max assist overall) with super slider under Rt forefoot and ACE wrap for dorsiflexion assist to improve ease of Rt LE advancement. PT facilitating lateral weight shifting (bil) and RT quads during Rt SLS. Improvement with modulating step length noted with repetition.   Therapy Documentation Precautions:  Precautions Precautions: Fall Precaution Comments: Rt hemiparesis  Restrictions Weight Bearing Restrictions: No  Pain:  no c/o  See FIM for current functional status  Therapy/Group: Individual Therapy  Thereasa ParkinHannah C Ameira Alessandrini 02/25/2014, 3:14 PM

## 2014-02-26 LAB — GLUCOSE, CAPILLARY
GLUCOSE-CAPILLARY: 120 mg/dL — AB (ref 70–99)
Glucose-Capillary: 123 mg/dL — ABNORMAL HIGH (ref 70–99)
Glucose-Capillary: 75 mg/dL (ref 70–99)
Glucose-Capillary: 116 mg/dL — ABNORMAL HIGH (ref 70–99)

## 2014-02-26 NOTE — Progress Notes (Signed)
Dakota Hall is a 63 y.o. male 07/30/1951 [id number]  Subjective: C/o being too sleepy on Baclofen - better on lesser dose. Slept well. Feeling OK.  Objective: Vital signs in last 24 hours: Temp:  [97.8 F (36.6 C)-98.4 F (36.9 C)] 97.8 F (36.6 C) (04/12 0458) Pulse Rate:  [54-60] 54 (04/12 0458) Resp:  [18] 18 (04/12 0458) BP: (131-158)/(77-82) 158/82 mmHg (04/12 0458) SpO2:  [96 %-97 %] 97 % (04/12 0458) Weight change:  Last BM Date: 02/25/14  Intake/Output from previous day: 04/11 0701 - 04/12 0700 In: 720 [P.O.:720] Out: 825 [Urine:825] Last cbgs: CBG (last 3)   Recent Labs  02/25/14 1628 02/25/14 2037 02/26/14 0649  GLUCAP 126* 105* 123*     Physical Exam General: No apparent distress  Eating breakfast HEENT: not dry Lungs: Normal effort. Lungs clear to auscultation, no crackles or wheezes. Cardiovascular: Regular rate and rhythm, no edema Abdomen: S/NT/ND; BS(+) Musculoskeletal:  unchanged Neurological: No new neurological deficits. Wounds: N/A    Skin: clear  Aging changes Mental state: Alert, oriented, cooperative     Lab Results: BMET    Component Value Date/Time   NA 143 02/13/2014 0733   K 3.5* 02/13/2014 0733   CL 105 02/13/2014 0733   CO2 23 02/13/2014 0733   GLUCOSE 147* 02/13/2014 0733   BUN 28* 02/13/2014 0733   CREATININE 1.15 02/13/2014 0733   CALCIUM 9.6 02/13/2014 0733   GFRNONAA 66* 02/13/2014 0733   GFRAA 77* 02/13/2014 0733   CBC    Component Value Date/Time   WBC 8.6 02/09/2014 0550   RBC 5.01 02/09/2014 0550   HGB 16.0 02/09/2014 0550   HCT 43.6 02/09/2014 0550   PLT 194 02/09/2014 0550   MCV 87.0 02/09/2014 0550   MCH 31.9 02/09/2014 0550   MCHC 36.7* 02/09/2014 0550   RDW 13.0 02/09/2014 0550   LYMPHSABS 3.1 02/09/2014 0550   MONOABS 0.6 02/09/2014 0550   EOSABS 0.2 02/09/2014 0550   BASOSABS 0.0 02/09/2014 0550    Studies/Results: No results found.  Medications: I have reviewed the patient's current  medications.  Assessment/Plan:   1. 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  2. DVT Prophylaxis/Anticoagulation: Subcutaneous Lovenox. Monitor platelet counts any signs of bleeding  3. Pain Management: Tylenol as needed  4. H/o depression with anxiety/Mood: Stable at this time. LCSW to follow for evaluation. Continue Abilify 2.5 mg daily and Zoloft 150 mg daily. Will use ativan 0.5 mg tid prn for anxiety.  5. Neuropsych: This patient is capable of making decisions on his own behalf.  6. Diabetes mellitus: Hemoglobin A1c 8.1. Check blood sugars a.c. and at bedtime. Cont Rx 7. Hypertension: Will monitor BP with bid checks--continue Norvasc 10 mg daily, Bystolic 5mg  daily, Diovan 160 mg daily, hydrochlorothiazide 12.5 mg daily. Monitor with increased mobility  8. Hyperlipidemia: On Zocor  9. Constipation: Will schedule Miralax for bowel program.  10. OSA: continue CPAP to help with sleep hygiene and energy levels.  11. Spasticity:changed  Baclofen to 10 mg qid. Splinting may be required as well.  12. Bradycardia. D/c BB  Cont Rx      Length of stay, days: 18  Tresa GarterAleksei V Lidya Mccalister , MD 02/26/2014, 8:30 AM

## 2014-02-26 NOTE — Progress Notes (Signed)
Pt. States he can place his cpap on himself. RT informed pt. To notify if he needs any assistance. 

## 2014-02-27 ENCOUNTER — Inpatient Hospital Stay (HOSPITAL_COMMUNITY): Payer: Managed Care, Other (non HMO) | Admitting: Speech Pathology

## 2014-02-27 ENCOUNTER — Inpatient Hospital Stay (HOSPITAL_COMMUNITY): Payer: Managed Care, Other (non HMO)

## 2014-02-27 ENCOUNTER — Encounter (HOSPITAL_COMMUNITY): Payer: Managed Care, Other (non HMO)

## 2014-02-27 ENCOUNTER — Inpatient Hospital Stay (HOSPITAL_COMMUNITY): Payer: Managed Care, Other (non HMO) | Admitting: Occupational Therapy

## 2014-02-27 DIAGNOSIS — I69959 Hemiplegia and hemiparesis following unspecified cerebrovascular disease affecting unspecified side: Secondary | ICD-10-CM

## 2014-02-27 DIAGNOSIS — I69922 Dysarthria following unspecified cerebrovascular disease: Secondary | ICD-10-CM

## 2014-02-27 DIAGNOSIS — E119 Type 2 diabetes mellitus without complications: Secondary | ICD-10-CM

## 2014-02-27 DIAGNOSIS — R1313 Dysphagia, pharyngeal phase: Secondary | ICD-10-CM

## 2014-02-27 DIAGNOSIS — I6330 Cerebral infarction due to thrombosis of unspecified cerebral artery: Secondary | ICD-10-CM

## 2014-02-27 LAB — GLUCOSE, CAPILLARY
Glucose-Capillary: 113 mg/dL — ABNORMAL HIGH (ref 70–99)
Glucose-Capillary: 124 mg/dL — ABNORMAL HIGH (ref 70–99)
Glucose-Capillary: 109 mg/dL — ABNORMAL HIGH (ref 70–99)
Glucose-Capillary: 143 mg/dL — ABNORMAL HIGH (ref 70–99)

## 2014-02-27 NOTE — Progress Notes (Signed)
Speech Language Pathology Daily Session Note  Patient Details  Name: Dakota Hall MRN: [id number] Date of Birth: 08/18/1951  Today's Date: 02/27/2014 Time: [id number] Time Calculation (min): 27 min  Short Term Goals: Week 3: SLP Short Term Goal 1 (Week 3): Pt will utilize speech intelligibility strategies at the sentence level with Mod I SLP Short Term Goal 2 (Week 3): Pt will demonstrate complex problem solving during functional task with Supervision level cues SLP Short Term Goal 3 (Week 3): Pt will utilize internal/external memory aids to facilitate working memory during functional tasks with Supervision level cues  Skilled Therapeutic Interventions: Skilled treatment session focused on addressing dysarthria goals. Patient required Supervision level verbal cues to utilize speech intelligibility strategies in unstructured conversation.  Patient was emotional during session which SLP suspects impacted intelligibility as well.  Continue with current plan of care.   FIM:  Comprehension Comprehension Mode: Auditory Comprehension: 5-Understands complex 90% of the time/Cues < 10% of the time Expression Expression Mode: Verbal Expression: 5-Expresses complex 90% of the time/cues < 10% of the time Social Interaction Social Interaction: 6-Interacts appropriately with others with medication or extra time (anti-anxiety, antidepressant). Problem Solving Problem Solving: 5-Solves basic problems: With no assist Memory Memory: 5-Recognizes or recalls 90% of the time/requires cueing < 10% of the time  Pain Pain Assessment Pain Assessment: No/denies pain  Therapy/Group: Individual Therapy  Charlane FerrettiMelissa Kazia Hall, M.A., CCC-SLP [phone]  Dakota Hall 02/27/2014, 4:32 PM

## 2014-02-27 NOTE — Progress Notes (Signed)
Physical Therapy Note  Patient Details  Name: Dakota AnoRoger W Lapaglia MRN: [id number] Date of Birth: 08/12/1951 Today's Date: 02/27/2014 [id number], 45 min individual therapy; 1130-1200, 30 min co-tx with another PT No pain reported  Tx 1:  Pt sad, tearful , n bed at start of tx.  Pt stated he was "down" all weekend.  He stated he had been on anti-depressants for years.  Emotional support provided; he is motivated to improve.   Pt reported that he did HEP of quad sets over the weekend.  Pt demonstrated visible R quad set.  neuromuscular re-education via demo, set-up, tactile cues for rolling partial sidelying R to full sidelying R, x 10.  Using written cue car, pt practiced rolling L x 3, with max cues even with card.  Bed> w/c to R squat pivot with min assist.  NuStep for neuro re-ed, without use of UEs; x 8 minutes at level 1-2, rated 13 on BORG. Static> dynamic standing with LUE support, wt shifting L<>R and mini-squats with VCS for R knee ext, x 10 each.  W/c propulsion x 170' modified independent for activity tolerance.  Tx 2:  Co-tx with another PT for neuromuscular re-education for RLE using LiteGait.  Biased toward L, slightly unweighted on R, at speed at 3 miles/hour, without shoe R, pt worked on forward progress of RLE, with assistance x 3 minutes.   Although pt did initiate progression, he never achieved foot flat or heel strike.  With less bias toward L, wearing shoe, at .7 miles/hour, pt worked on foot flat> heel strike with assistance x 6 minutes.  Total distance  = 449' with 3 rest breaks of 1-2 minutes in standing.  See note by Jorje GuildBlair Hobble, PT for other details.  Susa LofflerCaroline O Antionio Negron 02/27/2014, 8:35 AM

## 2014-02-27 NOTE — Progress Notes (Signed)
Subjective/Complaints: 63 y.o. right-handed male with history of CVA x2, DM type 2, OSA, who was admitted 02/03/2014 with acute onset right-sided weakness, right facial weakness with difficulty speaking and was noted to have severely elevated BP requiring labetalol and nicardipine. CCT negative for acute changes. CTA head/neck with mild irregularity of Left PCA question thalamostriate type ischemia, no large vessel stenosis. Patient treated with TPA and MRI of the head 2x2.5 cm acute infarct left basal ganglia radiating to white matter tracts. MRA of the head with no major occlusion or stenosis. Echocardiogram ejection fraction 60% without emboli. Carotid Dopplers with no ICA stenosis. 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 Swallow evaluation done and showed evidence of pharyngeal dysphagia with delay in swallow and was placed on dysphagia 3, thin liquids Not much therapy over the weekend No new problemns Review of Systems -aphasic/ dysarthric  Objective: Vital Signs: Blood pressure 153/78, pulse 55, temperature 98.3 F (36.8 C), temperature source Oral, resp. rate 19, weight 106.2 kg (234 lb 2.1 oz), SpO2 98.00%. No results found. Results for orders placed during the hospital encounter of 02/08/14 (from the past 72 hour(s))  GLUCOSE, CAPILLARY     Status: Abnormal   Collection Time    02/24/14 11:24 AM      Result Value Ref Range   Glucose-Capillary 101 (*) 70 - 99 mg/dL   Comment 1 Notify RN    GLUCOSE, CAPILLARY     Status: Abnormal   Collection Time    02/24/14  4:22 PM      Result Value Ref Range   Glucose-Capillary 103 (*) 70 - 99 mg/dL   Comment 1 Notify RN    GLUCOSE, CAPILLARY     Status: Abnormal   Collection Time    02/24/14  9:00 PM      Result Value Ref Range   Glucose-Capillary 124 (*) 70 - 99 mg/dL  GLUCOSE, CAPILLARY     Status: Abnormal   Collection Time    02/25/14  6:54 AM      Result Value Ref Range   Glucose-Capillary 109 (*) 70 - 99 mg/dL  GLUCOSE, CAPILLARY     Status: Abnormal   Collection Time    02/25/14 11:42 AM      Result Value Ref Range   Glucose-Capillary 124 (*) 70 - 99 mg/dL   Comment 1 Notify RN    GLUCOSE, CAPILLARY     Status: Abnormal   Collection Time    02/25/14  4:28 PM      Result Value Ref Range   Glucose-Capillary 126 (*) 70 - 99 mg/dL   Comment 1 Notify RN    GLUCOSE, CAPILLARY     Status: Abnormal   Collection Time    02/25/14  8:37 PM      Result Value Ref Range   Glucose-Capillary 105 (*) 70 - 99 mg/dL  GLUCOSE, CAPILLARY     Status: Abnormal   Collection Time    02/26/14  6:49 AM      Result Value Ref Range   Glucose-Capillary 123 (*) 70 - 99 mg/dL  GLUCOSE, CAPILLARY     Status: None   Collection Time    02/26/14 11:47 AM      Result Value Ref Range   Glucose-Capillary 75  70 - 99 mg/dL   Comment 1 Notify RN    GLUCOSE, CAPILLARY     Status: Abnormal   Collection Time    02/26/14  4:41  PM      Result Value Ref Range   Glucose-Capillary 120 (*) 70 - 99 mg/dL   Comment 1 Notify RN    GLUCOSE, CAPILLARY     Status: Abnormal   Collection Time    02/26/14  8:40 PM      Result Value Ref Range   Glucose-Capillary 116 (*) 70 - 99 mg/dL     HEENT: normal Cardio: RRR Resp: CTA B/L and unlabored GI: BS positive and non distended Extremity:  Pulses positive and No Edema Skin:   Intact Neuro: Flat, Cranial Nerve II-XII normal, Normal Sensory, Abnormal Motor 0/5 in RUE except trace R grip, 2- to 2/5 R hip/knee ext synergy otherwise 0/5 and Tone:  Hypertonia 2-3/4 especially bicep, tricep, pec.  Hams/calf-1-2/4 Tone: [place] 3 FF Musc/Skel:  Normal Gen NAD   Assessment/Plan: 1. Functional deficits secondary to Left BG infarct with R spastic HP which require 3+ hours per day of interdisciplinary therapy in a comprehensive inpatient rehab setting. Physiatrist is providing close team supervision and 24 hour management of active medical problems  listed below. Physiatrist and rehab team continue to assess barriers to discharge/monitor patient progress toward functional and medical goals.  FIM: FIM - Bathing Bathing Steps Patient Completed:  (refused) Bathing: 3: Mod-Patient completes 5-7 [id number] 10 parts or 50-74%  FIM - Upper Body Dressing/Undressing Upper body dressing/undressing steps patient completed: Thread/unthread right sleeve of pullover shirt/dresss;Thread/unthread left sleeve of pullover shirt/dress;Put head through opening of pull over shirt/dress Upper body dressing/undressing: 4: Min-Patient completed 75 plus % of tasks FIM - Lower Body Dressing/Undressing Lower body dressing/undressing steps patient completed: Thread/unthread left pants leg;Thread/unthread right pants leg;Pull pants up/down;Don/Doff left shoe;Don/Doff right shoe;Fasten/unfasten right shoe;Fasten/unfasten left shoe;Don/Doff right sock Lower body dressing/undressing: 4: Min-Patient completed 75 plus % of tasks  FIM - Toileting Toileting steps completed by patient: Adjust clothing prior to toileting;Performs perineal hygiene Toileting Assistive Devices: Grab bar or rail for support Toileting: 4: Steadying assist  FIM - Diplomatic Services operational officerToilet Transfers Toilet Transfers Assistive Devices: Grab bars Toilet Transfers: 4-To toilet/BSC: Min A (steadying Pt. > 75%);3-From toilet/BSC: Mod A (lift or lower assist)  FIM - Bed/Chair Transfer Bed/Chair Transfer Assistive Devices: Arm rests Bed/Chair Transfer: 4: Chair or W/C > Bed: Min A (steadying Pt. > 75%)  FIM - Locomotion: Wheelchair Distance: 200 Locomotion: Wheelchair: 6: Travels 150 ft or more, turns around, maneuvers to table, bed or toilet, negotiates 3% grade: maneuvers on rugs and over door sills independently FIM - Locomotion: Ambulation Locomotion: Ambulation Assistive Devices: Fara BorosWalker - Eva Ambulation/Gait Assistance: 1: +2 Total assist Locomotion: Ambulation: 1: Two helpers  Comprehension Comprehension Mode:  Auditory Comprehension: 5-Understands complex 90% of the time/Cues < 10% of the time  Expression Expression Mode: Verbal Expression: 5-Expresses basic 90% of the time/requires cueing < 10% of the time.  Social Interaction Social Interaction: 6-Interacts appropriately with others with medication or extra time (anti-anxiety, antidepressant).  Problem Solving Problem Solving: 5-Solves basic problems: With no assist  Memory Memory: 5-Recognizes or recalls 90% of the time/requires cueing < 10% of the time   Medical Problem List and Plan:  1. 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  2. DVT Prophylaxis/Anticoagulation: Subcutaneous Lovenox. Monitor platelet counts any signs of bleeding  3. Pain Management: Tylenol as needed  4. H/o depression with anxiety/Mood: Stable at this time. LCSW to follow for evaluation. Continue Abilify 2.5 mg daily and Zoloft 150 mg daily. Will use ativan 0.5 mg tid prn  for anxiety.  5. Neuropsych: This patient is capable of making decisions on his own behalf.  6. Diabetes mellitus: Hemoglobin A1c 8.1. Check blood sugars a.c. and at bedtime.  7. Hypertension: Will monitor BP with bid checks--continue Norvasc 10 mg daily, Bystolic 5mg  daily, Diovan 160 mg daily, hydrochlorothiazide 12.5 mg daily. Monitor with increased mobility  8. Hyperlipidemia: On Zocor  9. Constipation: Will schedule Miralax for bowel program.  10. OSA: continue CPAP to help with sleep hygiene and energy levels.  11. Spasticity: improved on increased baclofen, monitor for sedation , wrist hand orthosis on R 12.  Bradycardia off BB,  EKG-sinus brady  LOS (Days) 19 A FACE TO FACE EVALUATION WAS PERFORMED  Dakota Hall 02/27/2014, 7:32 AM

## 2014-02-27 NOTE — Progress Notes (Signed)
Occupational Therapy Session Note  Patient Details  Name: Dakota Hall MRN: [id number] Date of Birth: 1951-08-09  Today's Date: 02/27/2014 Time: 0900-1000 Time Calculation (min): 60 min  Short Term Goals: Week 1:  OT Short Term Goal 1 (Week 1): Pt will be able to complete a squat pivot transfer to the toilet using grab bars with mod A x 1.  OT Short Term Goal 1 - Progress (Week 1): Met OT Short Term Goal 2 (Week 1): Pt will stand at sink with min A to allow him to complete LB dressing. OT Short Term Goal 2 - Progress (Week 1): Progressing toward goal OT Short Term Goal 3 (Week 1): Pt will don shirt with min A. OT Short Term Goal 3 - Progress (Week 1): Met OT Short Term Goal 4 (Week 1): Pt will don pants with min A. OT Short Term Goal 4 - Progress (Week 1): Met OT Short Term Goal 5 (Week 1): Pt will be able to cleanse himself on toilet with steady A. OT Short Term Goal 5 - Progress (Week 1): Progressing toward goal Week 2:  OT Short Term Goal 1 (Week 2): Patient will stand at sink with min assist to allow for LB dressing OT Short Term Goal 2 (Week 2): Patient will be a able to perform hygiene while seated on toilet with steady assist OT Short Term Goal 3 (Week 2): Patient will manage RUE during all mobility and BADL tasks with no more than 2 vcs per session. OT Short Term Goal 4 (Week 2): Patient will perform SROM exercises with min assist   Skilled Therapeutic Interventions/Progress Updates:      Pt seen for BADL retraining of toileting, bathing, and dressing with a focus on trunk control for transfers, RUE management during transfers, sit to stand and standing balance along with hemi ADL techniques.  Pt transferred from w/c to toilet back to chair and then to tub bench in shower using grab bar and min to mod A squat pivot. Pt was able to stand with min A to adjust pants down hips.  In shower, used long sponge and weight shifting to right to wash bottom with steady A only. He was able to  cross R leg over L knee with support to keep it on his knee as he donned his R sock.  Pt also demonstrated improved standing balance, he worked on static standing balance 2x for 2 min each with steady assist.  Pt was able to continually work with minimal rest breaks. Pt seated in w/c with call light in reach at end of the session.  Therapy Documentation Precautions:  Precautions Precautions: Fall Precaution Comments: Rt hemiparesis  Restrictions Weight Bearing Restrictions: No   Pain: Pain Assessment Pain Assessment: No/denies pain ADL: ADL ADL Comments: Refer to FIM  See FIM for current functional status  Therapy/Group: Individual Therapy  Harlene Ramus 02/27/2014, 11:27 AM

## 2014-02-27 NOTE — Progress Notes (Signed)
RT spoke to patient about applying the CPAP; patient stated that he would put the CPAP on when he was ready to go to bed.  Patient just required the RT to add some water to the CPAP; patient has home mask and the CPAP is set to Auto titrate. RT will continue to monitor.

## 2014-02-27 NOTE — Progress Notes (Signed)
Physical Therapy Session Note  Patient Details  Name: Dakota Hall MRN: [id number] Date of Birth: 05/11/1951  Today's Date: 02/27/2014 Time: 1100-1130 (Co-tx with CC; entire session from 1100-1200) Time Calculation (min): 30 min  Skilled Therapeutic Interventions/Progress Updates:    Skilled physical therapy co-tx with CC (primary PT); see primary PT's note for further detail. Session focused on gait training. See below for detailed description of therapeutic exercises. Transitioned to gait training using LiteGait over treadmill; focus on RLE advancement. See primary PT's note for detailed parameters. This PT provided manual facilitation for the following: bilat weight shifting, bilat hip extension. Tactile cueing at R gluteus maximus for stance stability; verbal cueing for upright posture.   Therapy Documentation Precautions:  Precautions Precautions: Fall Precaution Comments: Rt hemiparesis  Restrictions Weight Bearing Restrictions: No Pain: Pain Assessment Pain Assessment: No/denies pain Pain Score: 0-No pain Exercises:  Ptperformed the following self-stretches (to increase manage RLE tone, increase RLE muscle extensibility, and improve gait stability) while seated in w/c using paper handout requiring mod A for setup, mod multimodal cueing for technique: R hamstring self-stretch 4x30-second holds; R quad sets x10 reps (x2-second holds).  See FIM for current functional status  Therapy/Group: Co-Treatment  Carlena SaxBlair A Hobble 02/27/2014, 12:03 PM

## 2014-02-28 ENCOUNTER — Inpatient Hospital Stay (HOSPITAL_COMMUNITY): Payer: Managed Care, Other (non HMO) | Admitting: Occupational Therapy

## 2014-02-28 ENCOUNTER — Inpatient Hospital Stay (HOSPITAL_COMMUNITY): Payer: Managed Care, Other (non HMO)

## 2014-02-28 ENCOUNTER — Inpatient Hospital Stay (HOSPITAL_COMMUNITY): Payer: Managed Care, Other (non HMO) | Admitting: *Deleted

## 2014-02-28 DIAGNOSIS — I69922 Dysarthria following unspecified cerebrovascular disease: Secondary | ICD-10-CM

## 2014-02-28 DIAGNOSIS — R1313 Dysphagia, pharyngeal phase: Secondary | ICD-10-CM

## 2014-02-28 DIAGNOSIS — E119 Type 2 diabetes mellitus without complications: Secondary | ICD-10-CM

## 2014-02-28 DIAGNOSIS — I69959 Hemiplegia and hemiparesis following unspecified cerebrovascular disease affecting unspecified side: Secondary | ICD-10-CM

## 2014-02-28 DIAGNOSIS — I6330 Cerebral infarction due to thrombosis of unspecified cerebral artery: Secondary | ICD-10-CM

## 2014-02-28 DIAGNOSIS — I69991 Dysphagia following unspecified cerebrovascular disease: Secondary | ICD-10-CM

## 2014-02-28 LAB — GLUCOSE, CAPILLARY
GLUCOSE-CAPILLARY: 69 mg/dL — AB (ref 70–99)
GLUCOSE-CAPILLARY: 128 mg/dL — AB (ref 70–99)
Glucose-Capillary: 130 mg/dL — ABNORMAL HIGH (ref 70–99)
Glucose-Capillary: 88 mg/dL (ref 70–99)
Glucose-Capillary: 84 mg/dL (ref 70–99)

## 2014-02-28 NOTE — Progress Notes (Signed)
Pt will place on and off mask when needed. Pt encouraged to call RT if needing any assistance.

## 2014-02-28 NOTE — Consult Note (Signed)
DIAGNOSTIC EVALUATION - [facility]IDENTIAL [facility]   MEDICAL NECESSITY:  Dakota Hall was seen on the [facility] for an initial diagnostic evaluation owing to the patient's diagnosis of stroke.   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 He was reportedly admitted on 02/03/2014 with acute onset right-sided weakness, right facial weakness with difficulty speaking. His BP was severely elevated. CCT was negative for acute changes. Patient treated with TPA and MRI of the head revealed a 2x2.5 cm acute infarct in the left basal ganglia radiating to white matter tracts.  Dakota Hall was previously seen by this provider on 02/13/14 for an initial diagnostic evaluation. He was seen today for follow-up. Results from the original appointment were as follows:   Overall, Dakota Hall is in a great deal of distress in light of his present medical situation. He has a history of major depression and anxiety. I recommended that he and I schedule formal supportive psychotherapy sessions while he is with us. He agreed. I also encouraged him to educate his psychiatric providers about his present medical status. After discharge, he should undergo neuropsychological testing to determine his ability to return to work.   Since then, Dakota Hall described his mood as "worse." He is quick to find the negative in in himself, the world and his situation. A great deal of time was spent helping him learn to redirect and shape his thinking. He has minimal psychological resources and has a tendency to hold in his emotions. We discussed multiple ways to find emotional release including medication management (via his psychiatrist), psychotherapy (via his  counselor), physical activity, and by participating in activities he enjoys (e.g., crafting).   Physically Dakota Hall notices gains in therapy but ruminates on his loss of function and his inability to do things he used to enjoy. We spent a portion of the session discussing that he can still perform these activities but possibly may have to them in a different way. He is also "scared" that he may not discharge home on his scheduled date. Finally, the patient and I spent time on stroke education and setting reasonable expectations for recovery.   Dakota Hall was tearful at the onset of the session but by the end he said he felt better. In order to facilitate immediate treatment upon discharge, we called his wife today and she is supposed to communicate what happened to the patient's psychiatrist and psychotherapist. I encouraged weekly counseling sessions at first.   PROCEDURES ADMINISTERED: [1 unit 90791 on 02/27/2014] Diagnostic clinical interview  Review of available records  Emotional & Behavioral Evaluation: Dakota Hall was appropriately dressed for season and situation, and he appeared tidy and well-groomed. Normal posture was noted. He was friendly and rapport was adequately established. His speech was mildly slurred but he was able to express ideas effectively. His affect was appropriately modulated, though he seemed depressed and tearful again at the beginning of the session. His mood calmed down by the end of the appointment. Attention and motivation were good.   From an emotional standpoint, Dakota Hall continues to suffer from chronic depression and anxiety. He is adjusting better to his hospital stay but is concerned that he won't discharge at the scheduled date. Suicidal/homicidal ideation, plan or intent was  denied. No manic or hypomanic episodes were reported. The patient denied ever experiencing any auditory/visual hallucinations. No major behavioral or personality changes were endorsed.     Overall, Dakota Hall continues to suffer from moderate to severe depression and anxiety that have been exacerbated by his recent stroke.  I will see him one more time next Monday for coping prior to his next day discharge. At that time I will again strongly encourage him to immediately follow-up with his psychiatrist and psychotherapist after he discharges home. Neuropsychological evaluation is warranted at some point to determine his ability to return to work.   RECOMMENDATIONS    Schedule a follow-up coping session with me next Monday.   Continued follow-up regarding his psychopharmacological medication(s).    Complete a comprehensive neuropsychological evaluation upon discharge. This can be scheduled with me at [phone].      Debbe MountsAdam T. Armend Hochstatter, Psy.D.  Clinical Neuropsychologist

## 2014-02-28 NOTE — Progress Notes (Signed)
Occupational Therapy Weekly Progress Note  Patient Details  Name: Dakota Hall MRN: [id number] Date of Birth: 10-08-1951  Beginning of progress report period: February 22, 2014 End of progress report period: February 28, 2014  Today's Date: 02/28/2014 Time: 0800-0900 and 1100-1130 Time Calculation (min): 60 min and 30 min  Patient has met 4 of 4 short term goals.  Pt has been progressing well with management of his RUE and RLE during self care and self care transfers to allow him to transfer more smoothly and stand with less assistance.  He has been using hemi techniques very well with no cues for grooming and UB dressing needed.  His overall endurance is also improving.  Patient continues to demonstrate the following deficits: increased tone and R side hemiplegia, deconditioning and therefore will continue to benefit from skilled OT intervention to enhance overall performance with BADL.  Patient progressing toward long term goals..  Continue plan of care.  OT Short Term Goals Week 1:  OT Short Term Goal 1 (Week 1): Pt will be able to complete a squat pivot transfer to the toilet using grab bars with mod A x 1.  OT Short Term Goal 1 - Progress (Week 1): Met OT Short Term Goal 2 (Week 1): Pt will stand at sink with min A to allow him to complete LB dressing. OT Short Term Goal 2 - Progress (Week 1): Progressing toward goal OT Short Term Goal 3 (Week 1): Pt will don shirt with min A. OT Short Term Goal 3 - Progress (Week 1): Met OT Short Term Goal 4 (Week 1): Pt will don pants with min A. OT Short Term Goal 4 - Progress (Week 1): Met OT Short Term Goal 5 (Week 1): Pt will be able to cleanse himself on toilet with steady A. OT Short Term Goal 5 - Progress (Week 1): Progressing toward goal Week 2:  OT Short Term Goal 1 (Week 2): Patient will stand at sink with min assist to allow for LB dressing OT Short Term Goal 1 - Progress (Week 2): Met OT Short Term Goal 2 (Week 2): Patient will be a able to  perform hygiene while seated on toilet with steady assist OT Short Term Goal 2 - Progress (Week 2): Met OT Short Term Goal 3 (Week 2): Patient will manage RUE during all mobility and BADL tasks with no more than 2 vcs per session. OT Short Term Goal 3 - Progress (Week 2): Met OT Short Term Goal 4 (Week 2): Patient will perform SROM exercises with min assist  OT Short Term Goal 4 - Progress (Week 2): Met Week 3:  OT Short Term Goal 1 (Week 3): STGs = LTGs  Skilled Therapeutic Interventions/Progress Updates:   Visit 1: No c/o pain.    Pt seen for BADL retraining of toileting, bathing, and dressing with a focus on dynamic trunk control with weight shifting, sit to stand, and standing balance. Pt did very well with all of his transfers, only needing guiding A to transfer from bed to chair, off the toilet, and to and from the shower.  His balance has improved to where he can sit on toilet and cleanse himself and sit in the shower with only supervision.  He did need more A to don R shoe today. In standing, he continues to require cues for proper placement of R foot before standing. Otherwise, he is able to stand statically with min A while pulling pants over hips.  Pt resting in w/c  at end of session with call light in reach. Pt was encouraged to work on his self ROM exercises for both R arm and leg.  Visit 2: Pt c/o mild pain in R bicep. Reviewed safe self ROM techniques to avoid R shoulder impingement. Pt seen this session for RUE neuro re-ed with weight bearing through RUE during dynamic reaching, active scapular movement to address posture, A/Arom using UE ranger.  Pt continues to have a slight amount of shoulder flex/ext and horizontal add.  Pt encouraged to work on a/arom in his room as often as possible. Pt also reports improved sensation. Pt worked on squat pivot transfer to and from mat along with sit to partial stand 10x holding partial stand briefly to facilitate symmetrical use of BLE.  Pt  returned to his room with call light and phone in reach.   Continue OT 5-7 days a week 1-2x a day for 60-90 minutes with Balance/vestibular training;Discharge planning;DME/adaptive equipment instruction;Functional mobility training;Functional electrical stimulation;Neuromuscular re-education;Patient/family education;Psychosocial support;Self Care/advanced ADL retraining;Therapeutic Activities;Therapeutic Exercise;UE/LE Strength taining/ROM;UE/LE Coordination activities.   Therapy Documentation Precautions:  Precautions Precautions: Fall Precaution Comments: Rt hemiparesis  Restrictions Weight Bearing Restrictions: No    ADL: ADL ADL Comments: Refer to FIM  See FIM for current functional status  Therapy/Group: Individual Therapy  Geoffery Lyons Saguier 02/28/2014, 10:18 AM

## 2014-02-28 NOTE — Progress Notes (Signed)
Physical Therapy Session Note  Patient Details  Name: Dakota Hall W Spivak MRN: [id number] Date of Birth: 12/16/1950  Today's Date: 02/28/2014 Time: 1340-1440 Time Calculation (min): 60 min   Skilled Therapeutic Interventions/Progress Updates:  Tx focused on NMR via R quad activation in stance and SLS and gait training with RW with R hand splint.  Co-treat with Recreation Therapy for multi-tasking activity.   Performed standing at standing frame table (no hip belt) for crafting activity while maintaining strong R quad contraction. Pt able to maintain contraction x5 sec due to dual task activity. Increased challenge level by placing L foot on yoga block, which was good challenge point for him and difficult to maintain. Pt remained standing 2x348min with min A for steadying only. Manual facilitation for R quad via tapping.   Performed serial WC<>mat transfers with Min A for completing turn. Pt needed cues for sequence, made card for reminder on pt's WC. Pt unable to keep L LE adducted during transfer and tends to come too far anteriorly for optimal momentum.   Pt engaged in standing with RW and hand splint at mirror for visual feedback. Pt needed close S for standing and Min A for sit<>stand transfers. Performed step-taps to yoga block 3x5 and attempted sustained L foot on step, but pt unable to hold this position >5sec. Pt focusing on achieving R quad contraction before stepping with L with great success.  Instructed pt in RW use. Performed gait x30' with RW and hand splint, ACE wrap DF assist and super slider. Mod A needed overall for RLE management and RW steering. Pt able to kick RLE but not achieve foot clearance and excellent quad control in stance.   Assisted pt back to bed with Min A.      Therapy Documentation Precautions:  Precautions Precautions: Fall Precaution Comments: Rt hemiparesis  Restrictions Weight Bearing Restrictions: No    Vital Signs: Therapy Vitals Temp: 98.5 F (36.9  C) Temp src: Oral Pulse Rate: 61 Resp: 19 BP: 118/65 mmHg Patient Position, if appropriate: Sitting Oxygen Therapy SpO2: 94 % O2 Device: None (Room air)  Pain: none     Locomotion : Ambulation Ambulation/Gait Assistance: 3: Mod assist Wheelchair Mobility Distance: 170   See FIM for current functional status  Therapy/Group: Individual Therapy Clydene Lamingole Catalino Plascencia, PT, DPT  02/28/2014, 4:03 PM

## 2014-02-28 NOTE — Progress Notes (Signed)
Recreational Therapy Session Note  Patient Details  Name: Dakota Hall MRN: [id number] Date of Birth: 06/11/1951 Today's Date: 02/28/2014  Pain: no c/o Skilled Therapeutic Interventions/Progress Updates: Session focused on activity tolerance, dynamic standing balance, WBing through RLE during simple tabletop craft.  Pt stood at standing frame table to work on table craft with min assist.  Pt had problem solved & then demonstrated one handed technique for craft activity with Mod I.  Therapy/Group: Co-Treatment Clois DupesLisa B Yulian Gosney 02/28/2014, 4:32 PM

## 2014-02-28 NOTE — Progress Notes (Signed)
Speech Language Pathology Daily Session Note  Patient Details  Name: Dakota Hall MRN: [id number] Date of Birth: 09/08/1951  Today's Date: 02/28/2014 Time: 1000-1045 Time Calculation (min): 45 min  Short Term Goals: Week 3: SLP Short Term Goal 1 (Week 3): Pt will utilize speech intelligibility strategies at the sentence level with Mod I SLP Short Term Goal 2 (Week 3): Pt will demonstrate complex problem solving during functional task with Supervision level cues SLP Short Term Goal 3 (Week 3): Pt will utilize internal/external memory aids to facilitate working memory during functional tasks with Supervision level cues  Skilled Therapeutic Interventions: Skilled treatment focused on cognitive and speech goals. SLP facilitated session with time management and scheduling task. Pt completed task with Min cues for organization, complex problem solving, and working memory. Pt utilized his speech strategies with supervision level cueing in a noisy environment at the conversational level. Continue plan of care.   FIM:  Comprehension Comprehension Mode: Auditory Comprehension: 5-Follows basic conversation/direction: With extra time/assistive device Expression Expression Mode: Verbal Expression: 5-Expresses basic 90% of the time/requires cueing < 10% of the time. Social Interaction Social Interaction: 6-Interacts appropriately with others with medication or extra time (anti-anxiety, antidepressant). Problem Solving Problem Solving: 5-Solves basic problems: With no assist Memory Memory: 5-Recognizes or recalls 90% of the time/requires cueing < 10% of the time  Pain Pain Assessment Pain Assessment: No/denies pain  Therapy/Group: Individual Therapy   Maxcine HamLaura Paiewonsky, M.A. CCC-SLP [phone]  Maxcine HamLaura Paiewonsky 02/28/2014, 1:13 PM

## 2014-02-28 NOTE — Progress Notes (Signed)
Subjective/Complaints: 63 y.o. right-handed male with history of CVA x2, DM type 2, OSA, who was admitted 02/03/2014 with acute onset right-sided weakness, right facial weakness with difficulty speaking and was noted to have severely elevated BP requiring labetalol and nicardipine. CCT negative for acute changes. CTA head/neck with mild irregularity of Left PCA question thalamostriate type ischemia, no large vessel stenosis. Patient treated with TPA and MRI of the head 2x2.5 cm acute infarct left basal ganglia radiating to white matter tracts. MRA of the head with no major occlusion or stenosis. Echocardiogram ejection fraction 60% without emboli. Carotid Dopplers with no ICA stenosis. 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 Swallow evaluation done and showed evidence of pharyngeal dysphagia with delay in swallow and was placed on dysphagia 3, thin liquids Not a great day in therapy yesterday but no particular concerns Review of Systems -aphasic/ dysarthric  Objective: Vital Signs: Blood pressure 175/82, pulse 54, temperature 98 F (36.7 C), temperature source Oral, resp. rate 18, weight 106.2 kg (234 lb 2.1 oz), SpO2 97.00%. No results found. Results for orders placed during the hospital encounter of 02/08/14 (from the past 72 hour(s))  GLUCOSE, CAPILLARY     Status: Abnormal   Collection Time    02/25/14 11:42 AM      Result Value Ref Range   Glucose-Capillary 124 (*) 70 - 99 mg/dL   Comment 1 Notify RN    GLUCOSE, CAPILLARY     Status: Abnormal   Collection Time    02/25/14  4:28 PM      Result Value Ref Range   Glucose-Capillary 126 (*) 70 - 99 mg/dL   Comment 1 Notify RN    GLUCOSE, CAPILLARY     Status: Abnormal   Collection Time    02/25/14  8:37 PM      Result Value Ref Range   Glucose-Capillary 105 (*) 70 - 99 mg/dL  GLUCOSE, CAPILLARY     Status: Abnormal   Collection Time    02/26/14  6:49 AM      Result Value Ref  Range   Glucose-Capillary 123 (*) 70 - 99 mg/dL  GLUCOSE, CAPILLARY     Status: None   Collection Time    02/26/14 11:47 AM      Result Value Ref Range   Glucose-Capillary 75  70 - 99 mg/dL   Comment 1 Notify RN    GLUCOSE, CAPILLARY     Status: Abnormal   Collection Time    02/26/14  4:41 PM      Result Value Ref Range   Glucose-Capillary 120 (*) 70 - 99 mg/dL   Comment 1 Notify RN    GLUCOSE, CAPILLARY     Status: Abnormal   Collection Time    02/26/14  8:40 PM      Result Value Ref Range   Glucose-Capillary 116 (*) 70 - 99 mg/dL  GLUCOSE, CAPILLARY     Status: Abnormal   Collection Time    02/27/14  7:32 AM      Result Value Ref Range   Glucose-Capillary 113 (*) 70 - 99 mg/dL  GLUCOSE, CAPILLARY     Status: Abnormal   Collection Time    02/27/14 12:03 PM      Result Value Ref Range   Glucose-Capillary 124 (*) 70 - 99 mg/dL  GLUCOSE, CAPILLARY     Status: Abnormal   Collection Time    02/27/14  5:16 PM  Result Value Ref Range   Glucose-Capillary 109 (*) 70 - 99 mg/dL  GLUCOSE, CAPILLARY     Status: Abnormal   Collection Time    02/27/14  9:22 PM      Result Value Ref Range   Glucose-Capillary 143 (*) 70 - 99 mg/dL     HEENT: normal Cardio: RRR Resp: CTA B/L and unlabored GI: BS positive and non distended Extremity:  Pulses positive and No Edema Skin:   Intact Neuro: Flat, Cranial Nerve II-XII normal, Normal Sensory, Abnormal Motor 0/5 in RUE except trace R grip, 2- to 2/5 R hip/knee ext synergy otherwise 0/5 and Tone:  Hypertonia 2-3/4 especially bicep, tricep, pec.  Hams/calf-1-2/4 Tone: Ashworth 3 FF Musc/Skel:  Normal Gen NAD   Assessment/Plan: 1. Functional deficits secondary to Left BG infarct with R spastic HP which require 3+ hours per day of interdisciplinary therapy in a comprehensive inpatient rehab setting. Physiatrist is providing close team supervision and 24 hour management of active medical problems listed below. Physiatrist and rehab team  continue to assess barriers to discharge/monitor patient progress toward functional and medical goals.  FIM: FIM - Bathing Bathing Steps Patient Completed: Chest;Right Arm;Left Arm;Abdomen;Front perineal area;Buttocks;Right upper leg;Left upper leg;Left lower leg (including foot);Right lower leg (including foot) (used long sponge) Bathing: 4: Steadying assist  FIM - Upper Body Dressing/Undressing Upper body dressing/undressing steps patient completed: Thread/unthread right sleeve of pullover shirt/dresss;Thread/unthread left sleeve of pullover shirt/dress;Put head through opening of pull over shirt/dress;Pull shirt over trunk Upper body dressing/undressing: 5: Set-up assist to: Obtain clothing/put away FIM - Lower Body Dressing/Undressing Lower body dressing/undressing steps patient completed: Thread/unthread left pants leg;Thread/unthread right pants leg;Pull pants up/down;Don/Doff left shoe;Don/Doff right shoe;Fasten/unfasten right shoe;Fasten/unfasten left shoe;Don/Doff right sock;Don/Doff left sock Lower body dressing/undressing: 4: Steadying Assist  FIM - Toileting Toileting steps completed by patient: Adjust clothing prior to toileting;Performs perineal hygiene Toileting Assistive Devices: Grab bar or rail for support Toileting: 4: Steadying assist  FIM - Diplomatic Services operational officerToilet Transfers Toilet Transfers Assistive Devices: Grab bars Toilet Transfers: 4-To toilet/BSC: Min A (steadying Pt. > 75%);3-From toilet/BSC: Mod A (lift or lower assist)  FIM - Bed/Chair Transfer Bed/Chair Transfer Assistive Devices: Arm rests Bed/Chair Transfer: 5: Supine > Sit: Supervision (verbal cues/safety issues);4: Bed > Chair or W/C: Min A (steadying Pt. > 75%)  FIM - Locomotion: Wheelchair Distance: 170 Locomotion: Wheelchair: 6: Travels 150 ft or more, turns around, maneuvers to table, bed or toilet, negotiates 3% grade: maneuvers on rugs and over door sills independently FIM - Locomotion: Ambulation Locomotion:  Ambulation Assistive Devices: Lite Gait Ambulation/Gait Assistance: 1: +2 Total assist Locomotion: Ambulation: 1: Two helpers  Comprehension Comprehension Mode: Auditory Comprehension: 5-Understands basic 90% of the time/requires cueing < 10% of the time  Expression Expression Mode: Verbal Expression: 5-Expresses basic 90% of the time/requires cueing < 10% of the time.  Social Interaction Social Interaction: 6-Interacts appropriately with others with medication or extra time (anti-anxiety, antidepressant).  Problem Solving Problem Solving: 5-Solves basic problems: With no assist  Memory Memory: 5-Recognizes or recalls 90% of the time/requires cueing < 10% of the time   Medical Problem List and Plan:  1. 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  2. DVT Prophylaxis/Anticoagulation: Subcutaneous Lovenox. Monitor platelet counts any signs of bleeding  3. Pain Management: Tylenol as needed  4. H/o depression with anxiety/Mood: Stable at this time. LCSW to follow for evaluation. Continue Abilify 2.5 mg daily and Zoloft 150 mg daily. Will use ativan  0.5 mg tid prn for anxiety.  5. Neuropsych: This patient is capable of making decisions on his own behalf.  6. Diabetes mellitus: Hemoglobin A1c 8.1. Check blood sugars a.c. and at bedtime.  7. Hypertension: Will monitor BP with bid checks--continue Norvasc 10 mg daily, Bystolic 5mg  daily, Diovan 160 mg daily, hydrochlorothiazide 12.5 mg daily. Monitor with increased mobility  8. Hyperlipidemia: On Zocor  9. Constipation: Will schedule Miralax for bowel program.  10. OSA: continue CPAP to help with sleep hygiene and energy levels.  11. Spasticity: improved on increased baclofen, monitor for sedation , wrist hand orthosis on R 12.  Bradycardia off BB,  EKG-sinus brady  LOS (Days) 20 A FACE TO FACE EVALUATION WAS PERFORMED  Erick Colacendrew E Nayzeth Altman 02/28/2014, 7:14 AM

## 2014-03-01 ENCOUNTER — Ambulatory Visit (HOSPITAL_COMMUNITY): Payer: Managed Care, Other (non HMO) | Admitting: *Deleted

## 2014-03-01 ENCOUNTER — Inpatient Hospital Stay (HOSPITAL_COMMUNITY): Payer: Managed Care, Other (non HMO) | Admitting: Occupational Therapy

## 2014-03-01 ENCOUNTER — Inpatient Hospital Stay (HOSPITAL_COMMUNITY): Payer: Managed Care, Other (non HMO) | Admitting: Speech Pathology

## 2014-03-01 ENCOUNTER — Ambulatory Visit (HOSPITAL_COMMUNITY): Payer: Managed Care, Other (non HMO) | Admitting: Occupational Therapy

## 2014-03-01 DIAGNOSIS — I69959 Hemiplegia and hemiparesis following unspecified cerebrovascular disease affecting unspecified side: Secondary | ICD-10-CM

## 2014-03-01 DIAGNOSIS — I69991 Dysphagia following unspecified cerebrovascular disease: Secondary | ICD-10-CM

## 2014-03-01 DIAGNOSIS — E119 Type 2 diabetes mellitus without complications: Secondary | ICD-10-CM

## 2014-03-01 DIAGNOSIS — I69922 Dysarthria following unspecified cerebrovascular disease: Secondary | ICD-10-CM

## 2014-03-01 DIAGNOSIS — R1313 Dysphagia, pharyngeal phase: Secondary | ICD-10-CM

## 2014-03-01 DIAGNOSIS — I6330 Cerebral infarction due to thrombosis of unspecified cerebral artery: Secondary | ICD-10-CM

## 2014-03-01 LAB — GLUCOSE, CAPILLARY
Glucose-Capillary: 108 mg/dL — ABNORMAL HIGH (ref 70–99)
Glucose-Capillary: 110 mg/dL — ABNORMAL HIGH (ref 70–99)
Glucose-Capillary: 98 mg/dL (ref 70–99)
Glucose-Capillary: 129 mg/dL — ABNORMAL HIGH (ref 70–99)

## 2014-03-01 MED ORDER — BACLOFEN 20 MG PO TABS
20.0000 mg | ORAL_TABLET | Freq: Three times a day (TID) | ORAL | Status: DC
  Administered 2014-03-01 – 2014-03-03 (×7): 20 mg via ORAL
  Filled 2014-03-01 (×9): qty 1

## 2014-03-01 MED ORDER — AMLODIPINE BESYLATE 10 MG PO TABS
10.0000 mg | ORAL_TABLET | Freq: Every day | ORAL | Status: DC
  Administered 2014-03-02: 10 mg via ORAL
  Filled 2014-03-01 (×2): qty 1

## 2014-03-01 NOTE — Patient Care Conference (Signed)
Inpatient RehabilitationTeam Conference and Plan of Care Update Date: 03/01/2014   Time: 10;30 AM    Patient Name: Dakota Hall      Medical Record Number: [id number]  Date of Birth: 07/29/1951 Sex: Male         Room/Bed: 4W22C/4W22C-01 Payor Info: Payor: CIG[employer] / Plan: CIGNA MANAGED / Product Type: *No Product type* /    Admitting Diagnosis: L CVA  Admit Date/Time:  02/08/2014  5:19 PM Admission Comments: No comment available   Primary Diagnosis:  <principal problem not specified> Principal Problem: <principal problem not specified>  Patient Active Problem List   Diagnosis Date Noted  . CVA (cerebral infarction) 02/08/2014  . Depression with anxiety   . 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 Obesity   . Stroke 02/03/2014  . Unspecified cerebral artery occlusion with cerebral infarction 07/20/2013  . Stroke, small vessel 05/18/2012  . Slurred speech 05/15/2012  . HTN (hypertension) 05/15/2012  . Noncompliance with medication regimen 05/15/2012  . DM (diabetes mellitus) 05/15/2012  . Vision changes 05/15/2012    Expected Discharge Date: Expected Discharge Date: 03/07/14  Team Members Present: Physician leading conference: Dr. Claudette LawsAndrew [name] Social Worker Present: Staci AcostaJenny Prevatt, LCSW;Becky Jadarious Dobbins, LCSW Nurse Present: Antony ContrasKimberly Cook, RN PT Present: Wanda Plumparoline Cook, PT;Blair Hobble, PT OT Present: Scherrie NovemberKayla Perkinson, Loistine ChanceT;Karen Pulaski, OT SLP Present: Maxcine HamLaura Paiewonsky, SLP PPS Coordinator present : Tora DuckMarie Noel, RN, CRRN;Becky Henrene DodgeWindsor, PT     Current Status/Progress Goal Weekly Team Focus  Medical   chronic depression and anxiety, spasticity  ongoing spasticity  adjust spasticty meds, ongoing neuropsych support   Bowel/Bladder   Continent of bowel and bladder, LBM 4/14  Continent of bowel and bladder  Continue to give stool softener/laxative as scheduled   Swallow/Nutrition/ Hydration   regular textures and thin liquids with Mod I  Mod I with least restrictive PO  at goal level,  education   ADL's   supervision with bathing and set up UB dressing, steady A with toileting and LB dressing, toilet and tub bench transfers only steady to min A  supervision overall  NMR, dynamic standing balance, ADL retraining, pt / family education   Mobility   Min A bed mob, Min A transfers, mod A gait with RW 30,' +2 stairs Mod I WC   Supervision all transfers, Min A gait and stairs; Mod I WC  Neuro re-ed for RLE, transfers, gait with RW, stairs, family training.    Communication   supervision level cues  Mod I at sentence level  increase use of speech strategies at the conversational level   Safety/Cognition/ Behavioral Observations  Min for complex problem solving  Mod I (will change to supervision for new and complex tasks)  complex problem solving, memory aids   Pain   Denies  </=3  Offer pain medication prior to therapy and as eneded   Skin   Dry  No new skin breakdown  Continue to ensure patient reposition self in bed/chair      *See Care Plan and progress notes for long and short-term goals.  Barriers to Discharge: see above    Possible Resolutions to Barriers:  Cont rehab    Discharge Planning/Teaching Needs:  Plan uncertain whether plan home versus going to NH. Check on insurance coverage for NHP      Team Discussion:  Making progress slowly-walked with lite gait yesterday at mod assist level.  Spasity issues interfere with his progress. Question whether wife can manage at home at discharge.  Revisions to Treatment Plan:  Possible NHP due to slow progress and care required at discharge   Continued Need for Acute Rehabilitation Level of Care: The patient requires daily medical management by a physician with specialized training in physical medicine and rehabilitation for the following conditions: Daily direction of a multidisciplinary physical rehabilitation program to ensure safe treatment while eliciting the highest outcome that is of practical value to the  patient.: Yes Daily medical management of patient stability for increased activity during participation in an intensive rehabilitation regime.: Yes  Dakota Hall 03/01/2014, 2:33 PM

## 2014-03-01 NOTE — Progress Notes (Signed)
Occupational Therapy Session Note  Patient Details  Name: Dakota Hall MRN: [id number] Date of Birth: May 10, 1951  Today's Date: 03/01/2014 Time: 0905-1005 Time Calculation (min): 60 min  Short Term Goals: Week 1:  OT Short Term Goal 1 (Week 1): Pt will be able to complete a squat pivot transfer to the toilet using grab bars with mod A x 1.  OT Short Term Goal 1 - Progress (Week 1): Met OT Short Term Goal 2 (Week 1): Pt will stand at sink with min A to allow him to complete LB dressing. OT Short Term Goal 2 - Progress (Week 1): Progressing toward goal OT Short Term Goal 3 (Week 1): Pt will don shirt with min A. OT Short Term Goal 3 - Progress (Week 1): Met OT Short Term Goal 4 (Week 1): Pt will don pants with min A. OT Short Term Goal 4 - Progress (Week 1): Met OT Short Term Goal 5 (Week 1): Pt will be able to cleanse himself on toilet with steady A. OT Short Term Goal 5 - Progress (Week 1): Progressing toward goal Week 2:  OT Short Term Goal 1 (Week 2): Patient will stand at sink with min assist to allow for LB dressing OT Short Term Goal 1 - Progress (Week 2): Met OT Short Term Goal 2 (Week 2): Patient will be a able to perform hygiene while seated on toilet with steady assist OT Short Term Goal 2 - Progress (Week 2): Met OT Short Term Goal 3 (Week 2): Patient will manage RUE during all mobility and BADL tasks with no more than 2 vcs per session. OT Short Term Goal 3 - Progress (Week 2): Met OT Short Term Goal 4 (Week 2): Patient will perform SROM exercises with min assist  OT Short Term Goal 4 - Progress (Week 2): Met Week 3:  OT Short Term Goal 1 (Week 3): STGs = LTGs  Skilled Therapeutic Interventions/Progress Updates:      Pt seen for BADL retraining of toileting, bathing, and dressing with a focus on sit to stand and standing balance.  Pt transferred out of bed to w/c and then to toilet and then to tub bench in shower all with just steady A to close S.  He is becoming even more  independent in his self care.  He needs support in sit to stand and standing from toilet for clothing management and from w/c with dressing, but today he demonstrated much increased stability.  He was able to stand and self correct when light steadying assist.  He was then able to maintain static standing with RLE knee extension with close S for 1 minute.  He worked on a/arom of RUE to facilitate active shoulder and elbow movement. PROM to R hand. Pt resting in w/c with call light in reach at end of session.  Therapy Documentation Precautions:  Precautions Precautions: Fall Precaution Comments: Rt hemiparesis  Restrictions Weight Bearing Restrictions: No    Vital Signs: Therapy Vitals Pulse Rate: 60 BP: 140/78 mmHg Patient Position, if appropriate: Sitting Pain: Pain Assessment Pain Assessment: No/denies pain ADL: ADL ADL Comments: Refer to FIM  See FIM for current functional status  Therapy/Group: Individual Therapy  Harlene Ramus 03/01/2014, 12:22 PM

## 2014-03-01 NOTE — Progress Notes (Signed)
Physical Therapy Weekly Progress Note  Patient Details  Name: Dakota Hall MRN: [id number] Date of Birth: 02-Mar-1951  Beginning of progress report period: February 23, 2014 End of progress report period: March 01, 2014  Today's Date: 03/01/2014 Time: 0905-1005; [id number] Time Calculation (min): 60 min, 45 min  Patient has met 1 of 2 short term goals.  (new STGs were inadvertently not set for week 3). Pt has been inconsistent with performance of gait on stairs. Pt has demonstrated much improved RLE and trunk activation in standing and gait the last couple of days.  Pt's affect has brightened considerably as well.  Patient continues to demonstrate the following deficits: spasticity, motor control, balance, affect, awareness, mobility and locomition, and therefore will continue to benefit from skilled PT intervention to enhance overall performance with activity tolerance, balance, postural control, ability to compensate for deficits, functional use of  right upper extremity and right lower extremity and awareness.  Patient progressing toward long term goals..  Continue plan of care.    PT Short Term Goals Week 3:  PT Short Term Goal 1 (Week 3): pt will perform gait x 25' with mod assist. Continue ST goal for up/down 5 steps with mod assist, as pt has been inconsistent  Skilled Therapeutic Interventions/Progress Updates: Tx 1:  +2 co-treat with Therapeutic Rec.  Brooke Pace here from [facility]; delivered RAFO. Chris observed gait and plans to add toe cap to R shoe to decrease friction and assist with normal movement pattern of forward progression using hip and knee flexion rather than ER and adduction.  RLE stretching via static standing x 1 minute before activity.  Therapeutic activity of kicking ball with R and LLE in standing, support of wall to L. When kicking with L foot, pt had consistent LOB backwards due to limited R hip muscle activation. neuromuscular re-education via demo, VCs,  manual cues for sit> stand x 2 without use of UEs, gait with RW, R hand splint, x 25' with mod assist, wearing new personal R Naval architect.  Pt reported that brace felt good.  Gait limited by R LE hypertonus, with + support reaction limiting initiation of stepping.  Increased spontaneous R knee flexion evident today; increased R hip activation evident with facilitation.    Tx 2: +2 tx with rehab tech  neuromuscular re-education via demo, manual facilitation, VCS for RLE stance stability, R trunk activation during sit>< stand without use of UEs, RUE wt bearing in standing with R and L step ups onto red compliant disk, gait x 40' with mod assist, R AFO and R hand splint, gait backwards x 6' +2 assist; up/down 5 steps 2 rails, +2 assist    Therapy Documentation Precautions:  Precautions Precautions: Fall Precaution Comments: Rt hemiparesis  Restrictions Weight Bearing Restrictions: No   Vital Signs: Therapy Vitals Pulse Rate: 60 BP: 140/78 mmHg Patient Position, if appropriate: Sitting Pain: Pain Assessment Pain Assessment: No/denies pain    See FIM for current functional status  Therapy/Group: Co-Treatment  Frederic Jericho 03/01/2014, 12:22 PM

## 2014-03-01 NOTE — Progress Notes (Signed)
Spoke with patient in regards to CPAP, home unit at bedside--pt prefers to self administer when ready. Understands RT available for any additional assistance needed.

## 2014-03-01 NOTE — Progress Notes (Signed)
Recreational Therapy Session Note  Patient Details  Name: Dakota Hall MRN: [id number] Date of Birth: 08/27/1951 Today's Date: 03/01/2014  Pain: no c/o Skilled Therapeutic Interventions/Progress Updates: Session focused on activity tolerance, dynamic standing balance, R knee control & weightbearing, w/c mobility, & safety during co-treat with PT.  Pt stood with and without UE support to kick a ball with RLE & LLE with support of rail on wall with min-mod assist.  Pt with LOB backwards.  Ended session with review & demo of modified craft activity seated w/c level with set up assist. Therapy/Group: Co-Treatment Clois DupesLisa B Ivanka Kirshner 03/01/2014, 1:16 PM

## 2014-03-01 NOTE — Progress Notes (Signed)
Social Work Patient ID: Dakota Hall, male   DOB: 1951/09/18, 63 y.o.   MRN: [id number] Met with pt and spoke with wife to discuss team conference progress and plan.  Wife feels pt requires too much care at this time to go home and wants to [facility] NHP.  Pt is on board with this now he has adjusted to it.  Spoke with Antoine Poche who reports he would need to go by Friday for them to cover it. Will begin NH bed search.

## 2014-03-01 NOTE — Progress Notes (Signed)
Subjective/Complaints: 63 y.o. right-handed male with history of CVA x2, DM type 2, OSA, who was admitted 02/03/2014 with acute onset right-sided weakness, right facial weakness with difficulty speaking and was noted to have severely elevated BP requiring labetalol and nicardipine. CCT negative for acute changes. CTA head/neck with mild irregularity of Left PCA question thalamostriate type ischemia, no large vessel stenosis. Patient treated with TPA and MRI of the head 2x2.5 cm acute infarct left basal ganglia radiating to white matter tracts. MRA of the head with no major occlusion or stenosis. Echocardiogram ejection fraction 60% without emboli. Carotid Dopplers with no ICA stenosis. 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 Swallow evaluation done and showed evidence of pharyngeal dysphagia with delay in swallow and was placed on dysphagia 3, thin liquids  Not a great day in therapy yesterday but no particular concerns Review of Systems -aphasic/ dysarthric  Objective: Vital Signs: Blood pressure 160/94, pulse 55, temperature 98 F (36.7 C), temperature source Oral, resp. rate 18, weight 107 kg (235 lb 14.3 oz), SpO2 96.00%. No results found. Results for orders placed during the hospital encounter of 02/08/14 (from the past 72 hour(s))  GLUCOSE, CAPILLARY     Status: None   Collection Time    02/26/14 11:47 AM      Result Value Ref Range   Glucose-Capillary 75  70 - 99 mg/dL   Comment 1 Notify RN    GLUCOSE, CAPILLARY     Status: Abnormal   Collection Time    02/26/14  4:41 PM      Result Value Ref Range   Glucose-Capillary 120 (*) 70 - 99 mg/dL   Comment 1 Notify RN    GLUCOSE, CAPILLARY     Status: Abnormal   Collection Time    02/26/14  8:40 PM      Result Value Ref Range   Glucose-Capillary 116 (*) 70 - 99 mg/dL  GLUCOSE, CAPILLARY     Status: Abnormal   Collection Time    02/27/14  7:32 AM      Result Value Ref Range   Glucose-Capillary 113 (*) 70 - 99 mg/dL  GLUCOSE, CAPILLARY     Status: Abnormal   Collection Time    02/27/14 12:03 PM      Result Value Ref Range   Glucose-Capillary 124 (*) 70 - 99 mg/dL  GLUCOSE, CAPILLARY     Status: Abnormal   Collection Time    02/27/14  5:16 PM      Result Value Ref Range   Glucose-Capillary 109 (*) 70 - 99 mg/dL  GLUCOSE, CAPILLARY     Status: Abnormal   Collection Time    02/27/14  9:22 PM      Result Value Ref Range   Glucose-Capillary 143 (*) 70 - 99 mg/dL  GLUCOSE, CAPILLARY     Status: Abnormal   Collection Time    02/28/14  7:35 AM      Result Value Ref Range   Glucose-Capillary 130 (*) 70 - 99 mg/dL  GLUCOSE, CAPILLARY     Status: Abnormal   Collection Time    02/28/14 11:42 AM      Result Value Ref Range   Glucose-Capillary 69 (*) 70 - 99 mg/dL  GLUCOSE, CAPILLARY     Status: None   Collection Time    02/28/14 12:04 PM      Result Value Ref Range   Glucose-Capillary 88  70 - 99 mg/dL  GLUCOSE, CAPILLARY  Status: None   Collection Time    02/28/14  4:54 PM      Result Value Ref Range   Glucose-Capillary 84  70 - 99 mg/dL  GLUCOSE, CAPILLARY     Status: Abnormal   Collection Time    02/28/14  9:27 PM      Result Value Ref Range   Glucose-Capillary 128 (*) 70 - 99 mg/dL     HEENT: normal Cardio: RRR Resp: CTA B/L and unlabored GI: BS positive and non distended Extremity:  Pulses positive and No Edema Skin:   Intact Neuro: Flat, Cranial Nerve II-XII normal, Normal Sensory, Abnormal Motor 0/5 in RUE except trace R grip, 2- to 2/5 R hip/knee ext synergy otherwise 0/5 and Tone:  Hypertonia 2-3/4 especially bicep, tricep, pec.  Hams/calf-1-2/4 Tone: [place] 3 FF, Right pec and Right elbow flexors Musc/Skel:  Normal Gen NAD   Assessment/Plan: 1. Functional deficits secondary to Left BG infarct with R spastic HP which require 3+ hours per day of interdisciplinary therapy in a comprehensive inpatient rehab setting. Physiatrist is  providing close team supervision and 24 hour management of active medical problems listed below. Physiatrist and rehab team continue to assess barriers to discharge/monitor patient progress toward functional and medical goals.  FIM: FIM - Bathing Bathing Steps Patient Completed: Chest;Right Arm;Left Arm;Abdomen;Front perineal area;Buttocks;Right upper leg;Left upper leg;Left lower leg (including foot);Right lower leg (including foot) Bathing: 5: Supervision: Safety issues/verbal cues  FIM - Upper Body Dressing/Undressing Upper body dressing/undressing steps patient completed: Thread/unthread right sleeve of pullover shirt/dresss;Thread/unthread left sleeve of pullover shirt/dress;Put head through opening of pull over shirt/dress;Pull shirt over trunk Upper body dressing/undressing: 5: Set-up assist to: Obtain clothing/put away FIM - Lower Body Dressing/Undressing Lower body dressing/undressing steps patient completed: Thread/unthread left pants leg;Thread/unthread right pants leg;Pull pants up/down;Don/Doff left shoe;Don/Doff right shoe;Fasten/unfasten left shoe;Don/Doff right sock;Don/Doff left sock Lower body dressing/undressing: 4: Min-Patient completed 75 plus % of tasks  FIM - Toileting Toileting steps completed by patient: Adjust clothing prior to toileting;Performs perineal hygiene Toileting Assistive Devices: Grab bar or rail for support Toileting: 4: Steadying assist  FIM - Diplomatic Services operational officerToilet Transfers Toilet Transfers Assistive Devices: Grab bars Toilet Transfers: 5-To toilet/BSC: Supervision (verbal cues/safety issues);5-From toilet/BSC: Supervision (verbal cues/safety issues)  FIM - BankerBed/Chair Transfer Bed/Chair Transfer Assistive Devices: Walker;Arm rests Bed/Chair Transfer: 4: Bed > Chair or W/C: Min A (steadying Pt. > 75%);4: Chair or W/C > Bed: Min A (steadying Pt. > 75%);4: Sit > Supine: Min A (steadying pt. > 75%/lift 1 leg)  FIM - Locomotion: Wheelchair Distance: 170 Locomotion:  Wheelchair: 6: Travels 150 ft or more, turns around, maneuvers to table, bed or toilet, negotiates 3% grade: maneuvers on rugs and over door sills independently FIM - Locomotion: Ambulation Locomotion: Ambulation Assistive Devices: Walker - Rolling;Other (comment) (Hand splint) Ambulation/Gait Assistance: 3: Mod assist Locomotion: Ambulation: 1: Travels less than 50 ft with moderate assistance (Pt: 50 - 74%)  Comprehension Comprehension Mode: Auditory Comprehension: 5-Understands basic 90% of the time/requires cueing < 10% of the time  Expression Expression Mode: Verbal Expression: 5-Expresses basic 90% of the time/requires cueing < 10% of the time.  Social Interaction Social Interaction: 6-Interacts appropriately with others with medication or extra time (anti-anxiety, antidepressant).  Problem Solving Problem Solving: 5-Solves basic problems: With no assist  Memory Memory: 5-Recognizes or recalls 90% of the time/requires cueing < 10% of the time   Medical Problem List and Plan:  1. 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  2. DVT Prophylaxis/Anticoagulation: Subcutaneous Lovenox. Monitor platelet counts any signs of bleeding  3. Pain Management: Tylenol as needed  4. H/o depression with anxiety/Mood: Stable at this time. LCSW to follow for evaluation. Continue Abilify 2.5 mg daily and Zoloft 150 mg daily. Will use ativan 0.5 mg tid prn for anxiety.  5. Neuropsych: This patient is capable of making decisions on his own behalf.  6. Diabetes mellitus: Hemoglobin A1c 8.1. Check blood sugars a.c. and at bedtime.  7. Hypertension: Will monitor BP with bid checks--continue Norvasc 10 mg daily, Bystolic 5mg  daily, Diovan 160 mg daily, hydrochlorothiazide 12.5 mg daily. Monitor with increased mobility, am BPs elevated , gets BP meds in am, change norvasc to qhs 8. Hyperlipidemia: On Zocor  9. Constipation: Will schedule Miralax for  bowel program.  10. OSA: continue CPAP to help with sleep hygiene and energy levels.  11. Spasticity: improved on increased baclofen, monitor for sedation , wrist hand orthosis on R 12.  Bradycardia off BB,  EKG-sinus brady  LOS (Days) 21 A FACE TO FACE EVALUATION WAS PERFORMED  Erick Colace 03/01/2014, 7:41 AM

## 2014-03-01 NOTE — Progress Notes (Signed)
Speech Language Pathology Daily Session Note  Patient Details  Name: Dakota Hall MRN: [id number] Date of Birth: 02/10/1951  Today's Date: 03/01/2014 Time: [id number] Time Calculation (min): 40 min  Short Term Goals: Week 3: SLP Short Term Goal 1 (Week 3): Pt will utilize speech intelligibility strategies at the sentence level with Mod I SLP Short Term Goal 2 (Week 3): Pt will demonstrate complex problem solving during functional task with Supervision level cues SLP Short Term Goal 3 (Week 3): Pt will utilize internal/external memory aids to facilitate working memory during functional tasks with Supervision level cues  Skilled Therapeutic Interventions: Skilled treatment session focused on addressing cognition with new learning task.  SLP facilitated session with verbal directions and demonstration x1 patient then Mod I for working memory throughout task to recall procedures to accurately complete task.  Continue with current plan of care.   FIM:  Comprehension Comprehension Mode: Auditory Comprehension: 5-Follows basic conversation/direction: With no assist Expression Expression Mode: Verbal Expression: 5-Expresses complex 90% of the time/cues < 10% of the time Social Interaction Social Interaction: 6-Interacts appropriately with others with medication or extra time (anti-anxiety, antidepressant). Problem Solving Problem Solving: 5-Solves basic problems: With no assist Memory Memory: 6-More than reasonable amt of time  Pain Pain Assessment Pain Assessment: No/denies pain  Therapy/Group: Individual Therapy  Charlane FerrettiMelissa Terrah Decoster, M.A., CCC-SLP [phone]  Ophelia ShoulderMelissa M Latroya Ng 03/01/2014, 5:02 PM

## 2014-03-02 ENCOUNTER — Inpatient Hospital Stay (HOSPITAL_COMMUNITY): Payer: Managed Care, Other (non HMO) | Admitting: Occupational Therapy

## 2014-03-02 ENCOUNTER — Inpatient Hospital Stay (HOSPITAL_COMMUNITY): Payer: Managed Care, Other (non HMO)

## 2014-03-02 LAB — GLUCOSE, CAPILLARY
GLUCOSE-CAPILLARY: 109 mg/dL — AB (ref 70–99)
GLUCOSE-CAPILLARY: 103 mg/dL — AB (ref 70–99)
Glucose-Capillary: 117 mg/dL — ABNORMAL HIGH (ref 70–99)
Glucose-Capillary: 132 mg/dL — ABNORMAL HIGH (ref 70–99)

## 2014-03-02 NOTE — Progress Notes (Signed)
Social Work Patient ID: Dakota Hall, male   DOB: 07/14/1951, 63 y.o.   MRN: [id number] Spoke with wife and pt to discuss two bed offers awaiting third offer-the pt's first choice.  Will contact again is have not heard by 3;30 pm. Pt feeling down about all of this-going to NH, employer lapse with STD paperwork and his progress.  Provided support and pointed out the  Progress he has made-walking with walker today without steady lift.  Will work toward transfer insurance wants it to be by tomorrow.

## 2014-03-02 NOTE — Progress Notes (Signed)
Physical Therapy Note  Patient Details  Name: Dakota Hall MRN: [id number] Date of Birth: 03/29/1951 Today's Date: 03/02/2014 0900-0930, 30 min; 1100-1200, 60 min individual therapy No pain reported  Tx 1: Pt performed seated self stretching R hamstrings with use of footstool; therapist stretched heel cord , x 30 seconds x 3 each.  Therapist donned pt's R AFO.  neuromuscular re-education via forced use, manual cues, VCS for gait x 40' including turn to L, working on R stance stability, wider BOS, R pelvis forward rotation.    W/c propulsion on level tile x 170' modified independent.  Tx 2:  neuromuscular re-education via tactile and manual cues, demo, VCs for R pelvic protraction in sitting, R hip ext and abd in tall kneeling with bil UE wt bearing, sit> stand without cues, supervision , requiring 2 attempts due to difficulty placing R foot safely for ankle stability  Gait with RW x 20' min/mod assist to approach steps; up/down 5 steps 1 rail (3 (5" high) and 2 (7" high) , with mod/max assist, VCs for sequencing.  Pt is still confused about which foot leads when descending steps.   Pt is awaiting placement to SNF.  Dakota Hall of [facility] plans to pick up R shoe today to add toe cap for decreased friction.  Dakota Hall 03/02/2014, 9:30 AM

## 2014-03-02 NOTE — Progress Notes (Signed)
Speech Language Pathology Daily Session Note  Patient Details  Name: Dakota Hall MRN: [id number] Date of Birth: 08/08/1951  Today's Date: 03/02/2014 Time: [id number] Time Calculation (min): 45 min  Short Term Goals: Week 3: SLP Short Term Goal 1 (Week 3): Pt will utilize speech intelligibility strategies at the sentence level with Mod I SLP Short Term Goal 2 (Week 3): Pt will demonstrate complex problem solving during functional task with Supervision level cues SLP Short Term Goal 3 (Week 3): Pt will utilize internal/external memory aids to facilitate working memory during functional tasks with Supervision level cues  Skilled Therapeutic Interventions: Skilled therapeutic intervention addressed cognitive and speech goals. Student provided supervision level cues for organization to complete complex problem solving tasks. Patient also required supervision to recall speech intelligibility strategies and utilized strategies in conversation with Mod I. Continue with current plan of care.    FIM:  Comprehension Comprehension Mode: Auditory Comprehension: 6-Follows complex conversation/direction: With extra time/assistive device Expression Expression Mode: Verbal Expression: 5-Expresses complex 90% of the time/cues < 10% of the time Social Interaction Social Interaction: 6-Interacts appropriately with others with medication or extra time (anti-anxiety, antidepressant). Problem Solving Problem Solving: 5-Solves complex 90% of the time/cues < 10% of the time Memory Memory: 6-More than reasonable amt of time FIM - Eating Eating Activity: 0: Activity did not occur  Pain Pain Assessment Pain Assessment: No/denies pain  Therapy/Group: Individual Therapy  Nancy NordmannChelsea Custer Pimenta 03/02/2014, 4:15 PM

## 2014-03-02 NOTE — Progress Notes (Signed)
Occupational Therapy Session Note  Patient Details  Name: Dakota Hall MRN: [id number] Date of Birth: 12-Feb-1951  Today's Date: 03/02/2014 Time: 1000-1100 Time Calculation (min): 60 min  Short Term Goals: Week 1:  OT Short Term Goal 1 (Week 1): Pt will be able to complete a squat pivot transfer to the toilet using grab bars with mod A x 1.  OT Short Term Goal 1 - Progress (Week 1): Met OT Short Term Goal 2 (Week 1): Pt will stand at sink with min A to allow him to complete LB dressing. OT Short Term Goal 2 - Progress (Week 1): Progressing toward goal OT Short Term Goal 3 (Week 1): Pt will don shirt with min A. OT Short Term Goal 3 - Progress (Week 1): Met OT Short Term Goal 4 (Week 1): Pt will don pants with min A. OT Short Term Goal 4 - Progress (Week 1): Met OT Short Term Goal 5 (Week 1): Pt will be able to cleanse himself on toilet with steady A. OT Short Term Goal 5 - Progress (Week 1): Progressing toward goal Week 2:  OT Short Term Goal 1 (Week 2): Patient will stand at sink with min assist to allow for LB dressing OT Short Term Goal 1 - Progress (Week 2): Met OT Short Term Goal 2 (Week 2): Patient will be a able to perform hygiene while seated on toilet with steady assist OT Short Term Goal 2 - Progress (Week 2): Met OT Short Term Goal 3 (Week 2): Patient will manage RUE during all mobility and BADL tasks with no more than 2 vcs per session. OT Short Term Goal 3 - Progress (Week 2): Met OT Short Term Goal 4 (Week 2): Patient will perform SROM exercises with min assist  OT Short Term Goal 4 - Progress (Week 2): Met Week 3:  OT Short Term Goal 1 (Week 3): STGs = LTGs  Skilled Therapeutic Interventions/Progress Updates:    Pt seen this session for BADL retraining of shaving and dressing followed by Neuro reeducation of RUE working on mat in gym. During dressing the focus of treatment was on stable sit to stand and standing balance. Pt stood 4x with min a to steady balance when he  shifted to the R too far out of base of support. He only needed min A 40% of time otherwise he stood with very close supervision. Pt responded well to cues to fully extend RLE. In gym, pt transferred to mat to work on RUE weight bearing with rotation to increase shoulder extension as he reached behind and wt bearing as he reached across his body.  Weight bearing on forearm with yoga blocks to his side.  A/Arom exercises to facilitate increased muscle activity with UE ranger and towel slides on table. At end of session, pt sitting on mat with arm supported on table to wait for his next therapy session. Another therapist was in the gym to supervise.     Therapy Documentation Precautions:  Precautions Precautions: Fall Precaution Comments: Rt hemiparesis  Restrictions Weight Bearing Restrictions: No    Vital Signs: Therapy Vitals BP: 147/66 mmHg Pain: Pain Assessment Pain Assessment: No/denies pain ADL: ADL ADL Comments: Refer to FIM  See FIM for current functional status  Therapy/Group: Individual Therapy  Geoffery Lyons Cristhian Vanhook 03/02/2014, 11:09 AM

## 2014-03-02 NOTE — Progress Notes (Signed)
Social Work Patient ID: Dionne Anooger W Leh, male   DOB: 07/06/1951, 63 y.o.   MRN: [id number] [facility].  Pt and wife aware he will either go to one of these facilities possibly tomorrow. Also contacted Marchelle Gearingaroline [employer] CM to inform her of the situation.  [facility] with [facility]. Pam-PA aware and will do discharge summary.

## 2014-03-02 NOTE — Progress Notes (Signed)
Subjective/Complaints: 63 y.o. right-handed male with history of CVA x2, DM type 2, OSA, who was admitted 02/03/2014 with acute onset right-sided weakness, right facial weakness with difficulty speaking and was noted to have severely elevated BP requiring labetalol and nicardipine. CCT negative for acute changes. CTA head/neck with mild irregularity of Left PCA question thalamostriate type ischemia, no large vessel stenosis. Patient treated with TPA and MRI of the head 2x2.5 cm acute infarct left basal ganglia radiating to white matter tracts. MRA of the head with no major occlusion or stenosis. Echocardiogram ejection fraction 60% without emboli. Carotid Dopplers with no ICA stenosis. 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 Swallow evaluation done and showed evidence of pharyngeal dysphagia with delay in swallow and was placed on dysphagia 3, thin liquids  Increasing tone, now has new WHO, no drowsiness during the day We discussed time course of CVA recovery today Review of Systems -aphasic/ dysarthric  Objective: Vital Signs: Blood pressure 155/78, pulse 57, temperature 97.9 F (36.6 C), temperature source Oral, resp. rate 20, weight 105.7 kg (233 lb 0.4 oz), SpO2 97.00%. No results found. Results for orders placed during the hospital encounter of 02/08/14 (from the past 72 hour(s))  GLUCOSE, CAPILLARY     Status: Abnormal   Collection Time    02/27/14  7:32 AM      Result Value Ref Range   Glucose-Capillary 113 (*) 70 - 99 mg/dL  GLUCOSE, CAPILLARY     Status: Abnormal   Collection Time    02/27/14 12:03 PM      Result Value Ref Range   Glucose-Capillary 124 (*) 70 - 99 mg/dL  GLUCOSE, CAPILLARY     Status: Abnormal   Collection Time    02/27/14  5:16 PM      Result Value Ref Range   Glucose-Capillary 109 (*) 70 - 99 mg/dL  GLUCOSE, CAPILLARY     Status: Abnormal   Collection Time    02/27/14  9:22 PM      Result Value Ref  Range   Glucose-Capillary 143 (*) 70 - 99 mg/dL  GLUCOSE, CAPILLARY     Status: Abnormal   Collection Time    02/28/14  7:35 AM      Result Value Ref Range   Glucose-Capillary 130 (*) 70 - 99 mg/dL  GLUCOSE, CAPILLARY     Status: Abnormal   Collection Time    02/28/14 11:42 AM      Result Value Ref Range   Glucose-Capillary 69 (*) 70 - 99 mg/dL  GLUCOSE, CAPILLARY     Status: None   Collection Time    02/28/14 12:04 PM      Result Value Ref Range   Glucose-Capillary 88  70 - 99 mg/dL  GLUCOSE, CAPILLARY     Status: None   Collection Time    02/28/14  4:54 PM      Result Value Ref Range   Glucose-Capillary 84  70 - 99 mg/dL  GLUCOSE, CAPILLARY     Status: Abnormal   Collection Time    02/28/14  9:27 PM      Result Value Ref Range   Glucose-Capillary 128 (*) 70 - 99 mg/dL  GLUCOSE, CAPILLARY     Status: Abnormal   Collection Time    03/01/14  7:44 AM      Result Value Ref Range   Glucose-Capillary 108 (*) 70 - 99 mg/dL  GLUCOSE, CAPILLARY     Status: Abnormal  Collection Time    03/01/14 12:12 PM      Result Value Ref Range   Glucose-Capillary 110 (*) 70 - 99 mg/dL   Comment 1 Documented in Chart    GLUCOSE, CAPILLARY     Status: None   Collection Time    03/01/14  4:15 PM      Result Value Ref Range   Glucose-Capillary 98  70 - 99 mg/dL   Comment 1 Notify RN    GLUCOSE, CAPILLARY     Status: Abnormal   Collection Time    03/01/14  8:49 PM      Result Value Ref Range   Glucose-Capillary 129 (*) 70 - 99 mg/dL     HEENT: normal Cardio: RRR Resp: CTA B/L and unlabored GI: BS positive and non distended Extremity:  Pulses positive and No Edema Skin:   Intact Neuro: Flat, Cranial Nerve II-XII normal, Normal Sensory, Abnormal Motor 0/5 in RUE except trace R grip, 2- to 2/5 R hip/knee ext synergy otherwise 0/5 and Tone:  Hypertonia 2-3/4 especially bicep, tricep, pec.  Hams/calf-1-2/4 Tone: Ashworth 3 FF, Right pec and Right elbow flexors Musc/Skel:  Normal Gen  NAD   Assessment/Plan: 1. Functional deficits secondary to Left BG infarct with R spastic HP which require 3+ hours per day of interdisciplinary therapy in a comprehensive inpatient rehab setting. Physiatrist is providing close team supervision and 24 hour management of active medical problems listed below. Physiatrist and rehab team continue to assess barriers to discharge/monitor patient progress toward functional and medical goals.  FIM: FIM - Bathing Bathing Steps Patient Completed: Chest;Right Arm;Left Arm;Abdomen;Front perineal area;Buttocks;Right upper leg;Left upper leg;Left lower leg (including foot);Right lower leg (including foot) Bathing: 5: Supervision: Safety issues/verbal cues  FIM - Upper Body Dressing/Undressing Upper body dressing/undressing steps patient completed: Thread/unthread right sleeve of pullover shirt/dresss;Thread/unthread left sleeve of pullover shirt/dress;Put head through opening of pull over shirt/dress;Pull shirt over trunk Upper body dressing/undressing: 5: Set-up assist to: Obtain clothing/put away FIM - Lower Body Dressing/Undressing Lower body dressing/undressing steps patient completed: Thread/unthread left pants leg;Thread/unthread right pants leg;Pull pants up/down;Don/Doff left shoe;Don/Doff right shoe;Fasten/unfasten left shoe;Don/Doff right sock;Don/Doff left sock;Fasten/unfasten right shoe Lower body dressing/undressing: 4: Steadying Assist  FIM - Toileting Toileting steps completed by patient: Adjust clothing prior to toileting;Performs perineal hygiene Toileting Assistive Devices: Grab bar or rail for support Toileting: 4: Steadying assist  FIM - Diplomatic Services operational officer Devices: Grab bars Toilet Transfers: 4-To toilet/BSC: Min A (steadying Pt. > 75%);5-From toilet/BSC: Supervision (verbal cues/safety issues)  FIM - Press photographer Assistive Devices: Arm rests Bed/Chair Transfer: 4: Bed > Chair or  W/C: Min A (steadying Pt. > 75%);4: Chair or W/C > Bed: Min A (steadying Pt. > 75%)  FIM - Locomotion: Wheelchair Distance: 170 Locomotion: Wheelchair: 6: Travels 150 ft or more, turns around, maneuvers to table, bed or toilet, negotiates 3% grade: maneuvers on rugs and over door sills independently FIM - Locomotion: Ambulation Locomotion: Ambulation Assistive Devices: Walker - Rolling;Other (comment);Orthosis (R blue rocker AFO and R hand splint) Ambulation/Gait Assistance: 3: Mod assist Locomotion: Ambulation: 1: Travels less than 50 ft with moderate assistance (Pt: 50 - 74%)  Comprehension Comprehension Mode: Auditory Comprehension: 5-Follows basic conversation/direction: With no assist  Expression Expression Mode: Verbal Expression: 5-Expresses complex 90% of the time/cues < 10% of the time  Social Interaction Social Interaction: 6-Interacts appropriately with others with medication or extra time (anti-anxiety, antidepressant).  Problem Solving Problem Solving: 5-Solves basic problems: With no assist  Memory Memory: 6-More than reasonable amt of time   Medical Problem List and Plan:  1. 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  2. DVT Prophylaxis/Anticoagulation: Subcutaneous Lovenox. Monitor platelet counts any signs of bleeding  3. Pain Management: Tylenol as needed  4. H/o depression with anxiety/Mood: Stable at this time. LCSW to follow for evaluation. Continue Abilify 2.5 mg daily and Zoloft 150 mg daily. Will use ativan 0.5 mg tid prn for anxiety.  5. Neuropsych: This patient is capable of making decisions on his own behalf.  6. Diabetes mellitus: Hemoglobin A1c 8.1. Check blood sugars a.c. and at bedtime.  7. Hypertension: Will monitor BP with bid checks--continue Norvasc 10 mg daily, Bystolic 5mg  daily, Diovan 160 mg daily, hydrochlorothiazide 12.5 mg daily. Monitor with increased mobility, am BPs elevated ,  gets BP meds in am, change norvasc to qhs 8. Hyperlipidemia: On Zocor  9. Constipation: Will schedule Miralax for bowel program.  10. OSA: continue CPAP to help with sleep hygiene and energy levels.  11. Spasticity: improved on increased baclofen, monitor for sedation , wrist hand orthosis on R 12.  Bradycardia off BB,  EKG-sinus brady  LOS (Days) 22 A FACE TO FACE EVALUATION WAS PERFORMED  Erick Colacendrew E [name] 03/02/2014, 7:21 AM

## 2014-03-02 NOTE — Progress Notes (Signed)
The skilled treatment note has been reviewed and SLP is in agreement.  Leonardo Makris Paiewonsky, M.A. CCC-SLP [phone]  

## 2014-03-03 ENCOUNTER — Inpatient Hospital Stay (HOSPITAL_COMMUNITY): Payer: Managed Care, Other (non HMO)

## 2014-03-03 ENCOUNTER — Inpatient Hospital Stay (HOSPITAL_COMMUNITY): Payer: Managed Care, Other (non HMO) | Admitting: Occupational Therapy

## 2014-03-03 DIAGNOSIS — I6330 Cerebral infarction due to thrombosis of unspecified cerebral artery: Secondary | ICD-10-CM

## 2014-03-03 DIAGNOSIS — R1313 Dysphagia, pharyngeal phase: Secondary | ICD-10-CM

## 2014-03-03 DIAGNOSIS — I69991 Dysphagia following unspecified cerebrovascular disease: Secondary | ICD-10-CM

## 2014-03-03 DIAGNOSIS — I69959 Hemiplegia and hemiparesis following unspecified cerebrovascular disease affecting unspecified side: Secondary | ICD-10-CM

## 2014-03-03 DIAGNOSIS — E119 Type 2 diabetes mellitus without complications: Secondary | ICD-10-CM

## 2014-03-03 DIAGNOSIS — I69922 Dysarthria following unspecified cerebrovascular disease: Secondary | ICD-10-CM

## 2014-03-03 LAB — GLUCOSE, CAPILLARY
Glucose-Capillary: 114 mg/dL — ABNORMAL HIGH (ref 70–99)
Glucose-Capillary: 84 mg/dL (ref 70–99)

## 2014-03-03 MED ORDER — CLOPIDOGREL BISULFATE 75 MG PO TABS: 75.0000 mg | ORAL_TABLET | Freq: Every day | ORAL | Status: DC

## 2014-03-03 MED ORDER — POTASSIUM CHLORIDE CRYS ER 10 MEQ PO TBCR
10.0000 meq | EXTENDED_RELEASE_TABLET | Freq: Two times a day (BID) | ORAL | Status: DC
  Administered 2014-03-03: 10 meq via ORAL
  Filled 2014-03-03 (×3): qty 1

## 2014-03-03 MED ORDER — PANTOPRAZOLE SODIUM 40 MG PO TBEC: 40.0000 mg | DELAYED_RELEASE_TABLET | Freq: Every day | ORAL | Status: DC

## 2014-03-03 MED ORDER — LORAZEPAM 0.5 MG PO TABS: 0.5000 mg | ORAL_TABLET | Freq: Four times a day (QID) | ORAL | Status: DC | PRN

## 2014-03-03 MED ORDER — POTASSIUM CHLORIDE CRYS ER 10 MEQ PO TBCR: 10.0000 meq | EXTENDED_RELEASE_TABLET | Freq: Two times a day (BID) | ORAL | Status: DC

## 2014-03-03 MED ORDER — POLYETHYLENE GLYCOL 3350 17 G PO PACK: 17.0000 g | PACK | Freq: Every day | ORAL | Status: DC

## 2014-03-03 MED ORDER — ARIPIPRAZOLE 5 MG PO TABS: 2.5000 mg | ORAL_TABLET | Freq: Every day | ORAL | Status: DC

## 2014-03-03 MED ORDER — SENNOSIDES-DOCUSATE SODIUM 8.6-50 MG PO TABS: 2.0000 | ORAL_TABLET | Freq: Every day | ORAL | Status: DC

## 2014-03-03 MED ORDER — BACLOFEN 20 MG PO TABS: 20.0000 mg | ORAL_TABLET | Freq: Three times a day (TID) | ORAL | Status: DC

## 2014-03-03 NOTE — Progress Notes (Signed)
[facility] with belongings. Report called to Thayer Ohmhris, Charity fundraiserN.

## 2014-03-03 NOTE — Progress Notes (Signed)
Social Work Patient ID: Dakota Hall, male   DOB: [birth year], 63 y.o.   MRN: [id number] Spoke with [facility] who has offered a bed for pt later this afternoon.  Informed pt and wife and they are agreeable, aware of the 10% bill until deductible is met. Christella Scheuermann has approved pt for admission.  Will work on paperwork for transfer today.

## 2014-03-03 NOTE — Progress Notes (Signed)
Patient has home CPAP in room and will place himself on and off when he gets ready.

## 2014-03-03 NOTE — Progress Notes (Signed)
Subjective/Complaints: 63 y.o. right-handed male with history of CVA x2, DM type 2, OSA, who was admitted 02/03/2014 with acute onset right-sided weakness, right facial weakness with difficulty speaking and was noted to have severely elevated BP requiring labetalol and nicardipine. CCT negative for acute changes. CTA head/neck with mild irregularity of Left PCA question thalamostriate type ischemia, no large vessel stenosis. Patient treated with TPA and MRI of the head 2x2.5 cm acute infarct left basal ganglia radiating to white matter tracts. MRA of the head with no major occlusion or stenosis. Echocardiogram ejection fraction 60% without emboli. Carotid Dopplers with no ICA stenosis. 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 Swallow evaluation done and showed evidence of pharyngeal dysphagia with delay in swallow and was placed on dysphagia 3, thin liquids  No new issues. RUE still tight   Review of Systems -aphasic/ dysarthric  Objective: Vital Signs: Blood pressure 169/90, pulse 54, temperature 97.2 F (36.2 C), temperature source Oral, resp. rate 19, height 5\' 9"  (1.753 m), weight 105.7 kg (233 lb 0.4 oz), SpO2 99.00%. No results found. Results for orders placed during the hospital encounter of 02/08/14 (from the past 72 hour(s))  GLUCOSE, CAPILLARY     Status: Abnormal   Collection Time    02/28/14 11:42 AM      Result Value Ref Range   Glucose-Capillary 69 (*) 70 - 99 mg/dL  GLUCOSE, CAPILLARY     Status: None   Collection Time    02/28/14 12:04 PM      Result Value Ref Range   Glucose-Capillary 88  70 - 99 mg/dL  GLUCOSE, CAPILLARY     Status: None   Collection Time    02/28/14  4:54 PM      Result Value Ref Range   Glucose-Capillary 84  70 - 99 mg/dL  GLUCOSE, CAPILLARY     Status: Abnormal   Collection Time    02/28/14  9:27 PM      Result Value Ref Range   Glucose-Capillary 128 (*) 70 - 99 mg/dL  GLUCOSE, CAPILLARY      Status: Abnormal   Collection Time    03/01/14  7:44 AM      Result Value Ref Range   Glucose-Capillary 108 (*) 70 - 99 mg/dL  GLUCOSE, CAPILLARY     Status: Abnormal   Collection Time    03/01/14 12:12 PM      Result Value Ref Range   Glucose-Capillary 110 (*) 70 - 99 mg/dL   Comment 1 Documented in Chart    GLUCOSE, CAPILLARY     Status: None   Collection Time    03/01/14  4:15 PM      Result Value Ref Range   Glucose-Capillary 98  70 - 99 mg/dL   Comment 1 Notify RN    GLUCOSE, CAPILLARY     Status: Abnormal   Collection Time    03/01/14  8:49 PM      Result Value Ref Range   Glucose-Capillary 129 (*) 70 - 99 mg/dL  GLUCOSE, CAPILLARY     Status: Abnormal   Collection Time    03/02/14  7:26 AM      Result Value Ref Range   Glucose-Capillary 109 (*) 70 - 99 mg/dL  GLUCOSE, CAPILLARY     Status: Abnormal   Collection Time    03/02/14 11:47 AM      Result Value Ref Range   Glucose-Capillary 103 (*) 70 - 99  mg/dL   Comment 1 Notify RN    GLUCOSE, CAPILLARY     Status: Abnormal   Collection Time    03/02/14  4:56 PM      Result Value Ref Range   Glucose-Capillary 117 (*) 70 - 99 mg/dL  GLUCOSE, CAPILLARY     Status: Abnormal   Collection Time    03/02/14  9:30 PM      Result Value Ref Range   Glucose-Capillary 132 (*) 70 - 99 mg/dL   Comment 1 Notify RN    GLUCOSE, CAPILLARY     Status: Abnormal   Collection Time    03/03/14  7:26 AM      Result Value Ref Range   Glucose-Capillary 114 (*) 70 - 99 mg/dL     HEENT: normal Cardio: RRR Resp: CTA B/L and unlabored GI: BS positive and non distended Extremity:  Pulses positive and No Edema Skin:   Intact Neuro: Flat, Cranial Nerve II-XII normal, Normal Sensory, Abnormal Motor 0/5 in RUE except trace R grip, 2- to 2/5 R hip/knee ext synergy otherwise 0/5 and Tone:  Hypertonia 2-3/4 especially bicep, tricep, pec.  Hams/calf-1-2/4 Tone: Ashworth 3 FF, Right pec and Right elbow flexors Musc/Skel:  Normal Gen  NAD   Assessment/Plan: 1. Functional deficits secondary to Left BG infarct with R spastic HP which require 3+ hours per day of interdisciplinary therapy in a comprehensive inpatient rehab setting. Physiatrist is providing close team supervision and 24 hour management of active medical problems listed below. Physiatrist and rehab team continue to assess barriers to discharge/monitor patient progress toward functional and medical goals.  Dc today?  FIM: FIM - Bathing Bathing Steps Patient Completed: Chest;Right Arm;Left Arm;Abdomen;Front perineal area;Buttocks;Right upper leg;Left upper leg;Left lower leg (including foot);Right lower leg (including foot) Bathing: 5: Supervision: Safety issues/verbal cues  FIM - Upper Body Dressing/Undressing Upper body dressing/undressing steps patient completed: Thread/unthread right sleeve of pullover shirt/dresss;Thread/unthread left sleeve of pullover shirt/dress;Put head through opening of pull over shirt/dress;Pull shirt over trunk Upper body dressing/undressing: 5: Set-up assist to: Obtain clothing/put away FIM - Lower Body Dressing/Undressing Lower body dressing/undressing steps patient completed: Thread/unthread left pants leg;Thread/unthread right pants leg;Pull pants up/down;Don/Doff left shoe;Don/Doff right shoe;Fasten/unfasten left shoe;Don/Doff right sock;Don/Doff left sock;Fasten/unfasten right shoe Lower body dressing/undressing: 4: Steadying Assist  FIM - Toileting Toileting steps completed by patient: Adjust clothing prior to toileting;Performs perineal hygiene Toileting Assistive Devices: Grab bar or rail for support Toileting: 3: Mod-Patient completed 2 of 3 steps  FIM - Diplomatic Services operational officer Devices: Grab bars Toilet Transfers: 4-To toilet/BSC: Min A (steadying Pt. > 75%);5-From toilet/BSC: Supervision (verbal cues/safety issues)  FIM - Press photographer Assistive Devices: Arm  rests Bed/Chair Transfer: 4: Bed > Chair or W/C: Min A (steadying Pt. > 75%);4: Chair or W/C > Bed: Min A (steadying Pt. > 75%)  FIM - Locomotion: Wheelchair Distance: 170 Locomotion: Wheelchair: 6: Travels 150 ft or more, turns around, maneuvers to table, bed or toilet, negotiates 3% grade: maneuvers on rugs and over door sills independently FIM - Locomotion: Ambulation Locomotion: Ambulation Assistive Devices: Walker - Rolling;Other (comment);Orthosis (R AFO and R hand splint) Ambulation/Gait Assistance: 3: Mod assist Locomotion: Ambulation: 1: Travels less than 50 ft with moderate assistance (Pt: 50 - 74%)  Comprehension Comprehension Mode: Auditory Comprehension: 6-Follows complex conversation/direction: With extra time/assistive device  Expression Expression Mode: Verbal Expression: 5-Expresses complex 90% of the time/cues < 10% of the time  Social Interaction Social Interaction: 6-Interacts appropriately with others  with medication or extra time (anti-anxiety, antidepressant).  Problem Solving Problem Solving: 5-Solves complex 90% of the time/cues < 10% of the time  Memory Memory: 5-Recognizes or recalls 90% of the time/requires cueing < 10% of the time   Medical Problem List and Plan:  1. 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  2. DVT Prophylaxis/Anticoagulation: Subcutaneous Lovenox. Monitor platelet counts any signs of bleeding  3. Pain Management: Tylenol as needed  4. H/o depression with anxiety/Mood: Stable at this time. LCSW to follow for evaluation. Continue Abilify 2.5 mg daily and Zoloft 150 mg daily.  ativan 0.5 mg tid prn for anxiety.  5. Neuropsych: This patient is capable of making decisions on his own behalf.  6. Diabetes mellitus: Hemoglobin A1c 8.1. Check blood sugars a.c. and at bedtime.  7. Hypertension: Will monitor BP with bid checks--continue Norvasc 10 mg daily, Bystolic 5mg  daily, Diovan 160  mg daily, hydrochlorothiazide 12.5 mg daily,  norvasc qhs 8. Hyperlipidemia: On Zocor  9. Constipation: Will schedule Miralax for bowel program.  10. OSA: continue CPAP to help with sleep hygiene and energy levels.  11. Spasticity: improved on increased baclofen, monitor for sedation , wrist hand orthosis on R  -likely will need botox 12.  Bradycardia off BB,  EKG-sinus brady  LOS (Days) 23 A FACE TO FACE EVALUATION WAS PERFORMED  Ranelle OysterZachary T Mckenzye Cutright 03/03/2014, 10:23 AM

## 2014-03-03 NOTE — Progress Notes (Signed)
Social Work Discharge Note Discharge Note  The overall goal for the admission was met for:   Discharge location: [facility]  Length of Stay: Yes-23 DAYS  Discharge activity level: Yes-MIN LEVEL  Home/community participation: Yes  Services provided included: MD, RD, PT, OT, SLP, RN, CM, TR, Pharmacy, Neuropsych and SW  Financial Services: Private Insurance: [employer]  Follow-up services arranged: Other: SHORT TERM NHP  Comments (or additional information):FELT SHORT TERM NHP BEST OPTION AT THIS TIME-THEN HOME  Patient/Family verbalized understanding of follow-up arrangements: Yes  Individual responsible for coordination of the follow-up plan: [name]-WIFE & PATIENT  Confirmed correct DME delivered: Elease Hashimoto 03/03/2014    Gardiner Rhyme Dessie Delcarlo

## 2014-03-03 NOTE — Progress Notes (Signed)
Occupational Therapy Discharge Summary  Patient Details  Name: Dakota Hall MRN: [id number] Date of Birth: [birth year]  Today's Date: 03/03/2014  Patient has met 4 of 4 long term goals due to improved activity tolerance, improved balance, postural control, ability to compensate for deficits and functional use of  RIGHT lower extremity.  Patient to discharge at [facility] Assist level.  Patient's care partner unavailable to provide the necessary physical assistance at discharge.    Reasons goals not met: Pt had 4 goals set at a supervision level: toileting, toilet transfer, LB dressing, and standing balance. Pt has been progressing in those areas but needs min to steady A with his balance when he is completing all of these tasks.  He will benefit from continued OT in skilled nursing to facilitate increased independence.  Recommendation:  Patient will benefit from ongoing skilled OT services in skilled nursing facility setting to continue to advance functional skills in the area of BADL and iADL.  Equipment: No equipment provided  Reasons for discharge: treatment goals met and discharge from hospital  Patient/family agrees with progress made and goals achieved: Yes  OT Discharge Precautions/Restrictions  Restrictions Weight Bearing Restrictions: No Vital Signs Therapy Vitals BP: 169/90 mmHg Pain Pain Assessment Pain Assessment: No/denies pain ADL ADL ADL Comments: Refer to FIM Vision/Perception  Vision- History Patient Visual Report: No change from baseline Vision- Assessment Eye Alignment: Within Functional Limits Ocular Range of Motion: Within Functional Limits Alignment/Gaze Preference: Within Defined Limits Tracking/Visual Pursuits: Able to track stimulus in all quads without difficulty Visual Fields: No apparent deficits Perception Comments: WFL  Cognition Orientation Level: Oriented X4 Sensation Sensation Light Touch: Appears Intact Stereognosis: Not  tested Hot/Cold: Appears Intact Proprioception: Appears Intact Coordination Gross Motor Movements are Fluid and Coordinated: No Fine Motor Movements are Fluid and Coordinated: No Finger Nose Finger Test: unable to with RUE due to hemiparesis Motor  Motor Motor: Hemiplegia;Abnormal tone Mobility    min A overall with transfers and standing balance Trunk/Postural Assessment  Cervical Assessment Cervical Assessment: Within Functional Limits Thoracic Assessment Thoracic Assessment: Within Functional Limits Lumbar Assessment Lumbar Assessment: Within Functional Limits Postural Control Postural Control: Within Functional Limits  Balance Dynamic Sitting Balance Dynamic Sitting - Level of Assistance: 6: Modified independent (Device/Increase time) Static Standing Balance Static Standing - Level of Assistance: 4: Min assist Extremity/Trunk Assessment RUE Assessment RUE Assessment:  (slight active movement in his shoulder) RUE Tone RUE Tone: Modified [place];Mild LUE Assessment LUE Assessment: Within Functional Limits  See FIM for current functional status  Harlene Ramus 03/03/2014, 11:55 AM

## 2014-03-03 NOTE — Progress Notes (Signed)
Speech Language Pathology Discharge Summary  Patient Details  Name: Dakota Hall MRN: [id number] Date of Birth: 02-17-1951  Today's Date: 03/03/2014   Patient has met 4 of 4 long term goals.  Patient to discharge at overall Supervision level.  Reasons goals not met: N/A   Clinical Impression/Discharge Summary: Patient met 4 out of 4 long term goals during this admission due to gains noted in speech intelligibility and swallowing function. Patient is consuming regular textures and thin liquids with straw with Mod I. He is using his speech intelligibility strategies at the conversational level with Supervision cues. He requires supervision cues for complex problem solving, organization, and recall of new information as well. Pt is scheduled to d/c to SNF where it is recommended that he continue to receive skilled SLP services to maximize functional independence and communication.  Care Partner:  Caregiver Able to Provide Assistance: Yes (SNF)  Type of Caregiver Assistance: Cognitive  Recommendation:  Skilled Nursing facility  Rationale for SLP Follow Up: Maximize functional communication;Maximize cognitive function and independence   Equipment:   N/A  Reasons for discharge: Treatment goals met;Discharged from hospital   Patient/Family Agrees with Progress Made and Goals Achieved: Yes   See FIM for current functional status   [name], M.A. CCC-SLP [phone]  [name] 03/03/2014, 3:53 PM

## 2014-03-03 NOTE — Discharge Summary (Signed)
Physician Discharge Summary  Patient ID: Dakota Hall MRN: [id number] DOB/AGE: November 01, 1951 63 y.o.  Admit date: 02/08/2014 Discharge date: 03/03/2014  Discharge Diagnoses:  Principal Problem:   Depression with anxiety Active Problems:   HTN (hypertension)   DM (diabetes mellitus)   CVA (cerebral infarction)   Discharged Condition: Improved   Labs:  Basic Metabolic Panel:    Component Value Date/Time   NA 143 02/13/2014 0733   K 3.5* 02/13/2014 0733   CL 105 02/13/2014 0733   CO2 23 02/13/2014 0733   GLUCOSE 147* 02/13/2014 0733   BUN 28* 02/13/2014 0733   CREATININE 1.15 02/13/2014 0733   CALCIUM 9.6 02/13/2014 0733   GFRNONAA 66* 02/13/2014 0733   GFRAA 77* 02/13/2014 0733      CBC:     Component Value Date/Time   WBC 8.6 02/09/2014 0550   RBC 5.01 02/09/2014 0550   HGB 16.0 02/09/2014 0550   HCT 43.6 02/09/2014 0550   PLT 194 02/09/2014 0550   MCV 87.0 02/09/2014 0550   MCH 31.9 02/09/2014 0550   MCHC 36.7* 02/09/2014 0550   RDW 13.0 02/09/2014 0550   LYMPHSABS 3.1 02/09/2014 0550   MONOABS 0.6 02/09/2014 0550   EOSABS 0.2 02/09/2014 0550   BASOSABS 0.0 02/09/2014 0550     CBG:  Recent Labs Lab 03/02/14 0726 03/02/14 1147 03/02/14 1656 03/02/14 2130 03/03/14 0726  GLUCAP 109* 103* 117* 132* 114*    Brief HPI:   Dakota Hall is a 63 y.o. right-handed male with history of CVA x2, DM type 2, OSA, who was admitted 02/03/2014 with acute onset right-sided weakness, right facial weakness with difficulty speaking and was noted to have severely elevated BP requiring labetalol and nicardipine.  Patient treated with TPA and MRI of the head 2x2.5 cm acute infarct left basal ganglia radiating to white matter tracts. MRA of the head with no major occlusion or stenosis.  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 He was placed on dysphagia 3, thin liquids due to mild dysphagia. Patient with resultant dense right hemiparesis, poor safety awareness  and moderate dysarthria impacting respiratory, articulatory and respiratory processes. CIR was recommended for progressive therapy.      Hospital Course: Dakota Hall was admitted to rehab 02/08/2014 for inpatient therapies to consist of PT, ST and OT at least three hours five days a week. Past admission physiatrist, therapy team and rehab RN have worked together to provide customized collaborative inpatient rehab. Blood pressures have been monitored on bid basis and have been overall controlled.. Po intake has been good and he's continent of B/B. He has been overwhelmed due to his current deficits and has required extensive ego support by team. He has developed spasticity and was started on baclofen to help with symptoms. WHO was ordered to help with increasing tone in right hand. Right AFO as well as shoe toe cap was ordered to help with gait quality and foot drop. Bystolic was discontinued due to bradycardia and HR is currently ranging in mid 50-60 range. Miralax has been effective in treating constipation. Diabetes has been monitored with ac/hs checks and BS have been well controlled on metformin and actos.    Neuropsychology has also been following for support. He continues to suffer from moderate to severe depression and anxiety that have been exacerbated by his recent stroke. No manic or hypomanic episodes reported or no major behavior changes.  Ativan was added to help with anxiety levels. Recommend  continued follow up by psychiatry/psychology past discharge.    He continues to make steady progress but wife is unable to provide assistance needed at current time. He has elected on continued therapies at SNF level. Bed is available on 03/03/14 and [facility]   Rehab course: During patient's stay in rehab weekly team conferences were held to monitor patient's progress, set goals and discuss barriers to discharge. Patient has had improvement in activity  tolerance, balance, postural control, as well as ability to compensate for deficits. He is has had improvement in functional use RUE  and RLE as well as improved awareness. He requires supervision for bathing and set up to steady assist for dressing tasks. He requires min assist for transfers and mod assist for ambulating 170 feet with RW.  He is modified independent to recall and perform basic functional tasks. He requires supervision with cues for organization to complete complex problem solving tasks as well as recall to utilize speech intelligibility strategies    Disposition: Skilled Nursing Facility  Diet:  Carb Modified medium.   Special Instructions: 1. Check blood sugars AC/HS and use SSI for elevated BS. 2. Psychiatry for follow up on mood.        Future Appointments Provider Department Dept Phone   03/03/2014 1:00 PM Susa LofflerCaroline O Cook, [place] [facility] [phone] [facility] [phone]   04/07/2014 12:30 PM Erick ColaceAndrew E [name], MD Dr. Claudette LawsAndrew [facility]insLag[facility] [place] [phone]   07/27/2014 2:00 PM Nilda RiggsNancy Carolyn Martin, NP [facility] [phone]       Medication List    STOP taking these medications       nebivolol 5 MG tablet  Commonly known as:  BYSTOLIC      TAKE these medications       amLODipine 10 MG tablet  Commonly known as:  NORVASC  Take 10 mg by mouth daily.     ARIPiprazole 5 MG tablet  Commonly known as:  ABILIFY  Take 0.5 tablets (2.5 mg total) by mouth daily.     baclofen 20 MG tablet  Commonly known as:  LIORESAL  Take 1 tablet (20 mg total) by mouth 3 (three) times daily.     clopidogrel 75 MG tablet  Commonly known as:  PLAVIX  Take 1 tablet (75 mg total) by mouth daily with breakfast.     LORazepam 0.5 MG tablet  Commonly known as:  ATIVAN  Take 1 tablet (0.5 mg total) by mouth every 6 (six) hours as needed for anxiety.     metFORMIN 500 MG 24 hr tablet  Commonly known as:  GLUCOPHAGE-XR  Take 1,000 mg by mouth  daily with breakfast.     pantoprazole 40 MG tablet  Commonly known as:  PROTONIX  Take 1 tablet (40 mg total) by mouth at bedtime.     pioglitazone 15 MG tablet  Commonly known as:  ACTOS  Take 15 mg by mouth daily.     polyethylene glycol packet  Commonly known as:  MIRALAX / GLYCOLAX  Take 17 g by mouth daily.     potassium chloride 10 MEQ tablet  Commonly known as:  K-DUR,KLOR-CON  Take 1 tablet (10 mEq total) by mouth 2 (two) times daily.     senna-docusate 8.6-50 MG per tablet  Commonly known as:  Senokot-S  Take 2 tablets by mouth at bedtime.     sertraline 100 MG tablet  Commonly known as:  ZOLOFT  Take 150  mg by mouth daily. Take 2 tabs (300) daily     simvastatin 20 MG tablet  Commonly known as:  ZOCOR  Take 1 tablet (20 mg total) by mouth daily at 6 PM.     traZODone 50 MG tablet  Commonly known as:  DESYREL  Take 50-100 mg by mouth daily as needed. For sleep     valsartan-hydrochlorothiazide 160-12.5 MG per tablet  Commonly known as:  DIOVAN-HCT  Take 1 tablet by mouth daily.     Vitamin D (Ergocalciferol) 50000 UNITS Caps capsule  Commonly known as:  DRISDOL  50,000 Units every 7 (seven) days.       Follow-up Information   Follow up with Erick ColaceKIRSTEINS,[name], MD On 04/07/2014. (Be there at 12 noon  for 12:30  appointment)    Specialty:  Physical Medicine and Rehabilitation   Contact information:   [address] [place] [address] [phone]       Signed: Jacquelynn Creeamela S Love 03/03/2014, 11:46 AM

## 2014-03-03 NOTE — Progress Notes (Signed)
Physical Therapy Discharge Summary  Patient Details  Name: JASKIRAT SCHWIEGER MRN: [id number] Date of Birth: Nov 29, 1950  Today's Date: 03/03/2014 Time: 0915-1000,  1300-1330 Time Calculation (min): 45 min,  30 min  Patient has met 5 of 11 long term goals due to improved activity tolerance, improved balance, improved postural control, increased strength, ability to compensate for deficits, functional use of  right upper extremity and right lower extremity and improved awareness.  Patient to discharge at a wheelchair level [place], and ambulatory with mod assist   Patient's care partner unavailable to provide the necessary physical assistance at discharge; n/a as pt is discharging to SNF.  Pt is very motivated to continue to progress.  He remains an excellent rehab candidate with the goal of eventually returning home.  He is an emotional person who is easily overwhelmed with his present status; he benefits from frequent positive reinforcement.  Reasons goals not met: early d/c to SNF  Recommendation:  Patient will benefit from ongoing skilled PT services in skilled nursing facility setting to continue to advance safe functional mobility, address ongoing impairments in strength, tone, coordination, balance, awareness, mobility and locomotion and minimize fall risk.  Equipment: R [place]er AFO, shoe cap on R shoe, R hand splint for RW  Reasons for discharge: discharge from hospital  Patient/family agrees with progress made and goals achieved: Yes  PT Discharge  Tx today: Tx 1:  Community w/c mobility in/out of elevator, use of elevator button, propulsion x 150' with supervision on level tile, outdoor brick and slight slope.  W/c>< outdoor garden bench with min guard assist after therapist prepares R leg rest and assists with R foot placement.  neuromuscular re-education- self R hamstring and heel cord stretching using stool to prop foot up on, and use of strap with initial  assistance, x 30 seconds x 3. Facilitation of isolated R knee ext, R hip flex and R ankle DF in sitting.  Sit>< stand without use of [place], wt shifting in standing wearing R AFO for continued hip flexor , R hamstring stretch.  Pt propelled 170' back to room modified independent. Tx 2:  Gait with RW, R hand splint, RAFO, x 40', x 20', x 20' with min/mod assist for occasional R foot placement for wider BOS, cues for upright trunk, occasionally sequencing.  R toe cap on shoe helps with clearance of foot and decrease in hip ER. Up/down 5 steps 1 rail with mod assist, VCs for R foot placement, hip ext as pt tends to drift backwards when descending. W/c propulsion back to room as above. Precautions/Restrictions Precautions Precautions: Fall Precaution Comments: Rt hemiparesis  Restrictions Weight Bearing Restrictions: No Vital Signs Therapy Vitals Temp: 98.5 F (36.9 C) Temp src: Oral Pulse Rate: 64 Resp: 20 BP: 157/82 mmHg Patient Position, if appropriate: Sitting Oxygen Therapy SpO2: 97 % O2 Device: None (Room air) Pain Pain Assessment Pain Assessment: No/denies pain Vision/Perception- wears glasses all the time; no vision deficits     Cognition Overall Cognitive Status: Impaired/Different from baseline Arousal/Alertness: Awake/alert Orientation Level: Oriented X4 Attention: Selective Selective Attention: Appears intact Memory: Impaired Memory Impairment: Decreased short term memory;Decreased recall of new information Decreased Short Term Memory: Verbal complex;Functional complex Awareness: Appears intact Problem Solving: Impaired Problem Solving Impairment: Functional complex Safety/Judgment: Appears intact Sensation Sensation Light Touch: Appears Intact Proprioception: Appears Intact Coordination Heel Shin Test: intact LLE; unable RLE Motor  Motor Motor: Hemiplegia;Abnormal tone (hypertonus RUE and RLE) Motor - Discharge Observations: emerging isolated movements out  of  synergy  Mobility Bed Mobility Bed Mobility:  (modified independent for all) Transfers Transfers: Yes Squat Pivot Transfers: 4: Min guard;4: Min Risk manager Details: Verbal cues for technique;Verbal cues for precautions/safety (R foot placement) Squat Pivot Transfer Details (indicate cue type and reason): pt has difficulty placing R foot in safest position for ankle stability, but is able to scoot it laterally with L foot Locomotion  Ambulation Ambulation: Yes Ambulation/Gait Assistance: 4: Min assist;3: Mod assist Ambulation Distance (Feet): 40 Feet Assistive device: Rolling walker Ambulation/Gait Assistance Details: Verbal cues for sequencing;Manual facilitation for placement Gait Gait: Yes Gait Pattern: Impaired Gait Pattern: Step-through pattern;Decreased step length - left;Decreased dorsiflexion - right;Decreased hip/knee flexion - right;Decreased weight shift to right;Right flexed knee in stance;Narrow base of support;Trunk rotated posteriorly on right Gait velocity: slow but able to increase slightly with cues Stairs / Additional Locomotion Stairs: Yes Stairs Assistance: 3: Mod assist Stair Management Technique: One rail Left;Forwards;Step to pattern Number of Stairs: 5 Height of Stairs: 7 Wheelchair Mobility Wheelchair Mobility: Yes Wheelchair Assistance: 6: Modified independent (Device/Increase time) Wheelchair Propulsion: Left upper extremity;Left lower extremity Wheelchair Parts Management: Needs assistance (for R legrest) Distance: 170  Trunk/Postural Assessment  Cervical Assessment Cervical Assessment: Within Functional Limits Thoracic Assessment Thoracic Assessment: Within Functional Limits Lumbar Assessment Lumbar Assessment: Within Functional Limits Postural Control Postural Control: Within Functional Limits  Balance Balance Balance Assessed: Yes Static Sitting Balance Static Sitting - Balance Support: No upper extremity supported;Feet  unsupported Dynamic Sitting Balance Dynamic Sitting - Level of Assistance: 6: Modified independent (Device/Increase time) Static Standing Balance Static Standing - Level of Assistance: 4: Min assist Extremity Assessment      RLE Assessment RLE Assessment: Exceptions to [facility] RLE Strength RLE Overall Strength Comments: grossly, hip flex/ext /abd/add 2/5; knee ext 4+5, ankle DF 1+/5 mostly inversion/great toe ext RLE Tone RLE Tone Comments: hamstring and heel cord RLE hypertonus LLE Assessment LLE Assessment: Within Functional Limits  See FIM for current functional status  Frederic Jericho 03/03/2014, 4:30 PM

## 2014-03-03 NOTE — Progress Notes (Signed)
Occupational Therapy Session Note  Patient Details  Name: Dakota Hall MRN: [id number] Date of Birth: [birth year]  Today's Date: 03/03/2014 Time: 1020-1135 Time Calculation (min): 75 min  Short Term Goals: Week 1:  OT Short Term Goal 1 (Week 1): Pt will be able to complete a squat pivot transfer to the toilet using grab bars with mod A x 1.  OT Short Term Goal 1 - Progress (Week 1): Met OT Short Term Goal 2 (Week 1): Pt will stand at sink with min A to allow him to complete LB dressing. OT Short Term Goal 2 - Progress (Week 1): Progressing toward goal OT Short Term Goal 3 (Week 1): Pt will don shirt with min A. OT Short Term Goal 3 - Progress (Week 1): Met OT Short Term Goal 4 (Week 1): Pt will don pants with min A. OT Short Term Goal 4 - Progress (Week 1): Met OT Short Term Goal 5 (Week 1): Pt will be able to cleanse himself on toilet with steady A. OT Short Term Goal 5 - Progress (Week 1): Progressing toward goal Week 2:  OT Short Term Goal 1 (Week 2): Patient will stand at sink with min assist to allow for LB dressing OT Short Term Goal 1 - Progress (Week 2): Met OT Short Term Goal 2 (Week 2): Patient will be a able to perform hygiene while seated on toilet with steady assist OT Short Term Goal 2 - Progress (Week 2): Met OT Short Term Goal 3 (Week 2): Patient will manage RUE during all mobility and BADL tasks with no more than 2 vcs per session. OT Short Term Goal 3 - Progress (Week 2): Met OT Short Term Goal 4 (Week 2): Patient will perform SROM exercises with min assist  OT Short Term Goal 4 - Progress (Week 2): Met Week 3:  OT Short Term Goal 1 (Week 3): STGs = LTGs  Skilled Therapeutic Interventions/Progress Updates:      Pt seen for BADL retraining of toileting, bathing, and dressing with a focus on standing balance and trunk control. Pt completed all of his self care and functional mobility with close Supervision to steady A.  He is demonstrating improved R side motor control  and balance.  Pt then went to gym and transferred to mat with steady A.  Pt worked on [employer] neuro re-ed with RUE wt bearing on mat with forward and lateral shifting.  Bilateral ball holds with max A to steady R hand on ball, weight shifting to increase pelvic control for sitting balance, a/arom with arm on ball and then with hand on towel sliding on mat. Pt continues to have active shoulder movement, but is not able to lift in without gravity eliminated. Pt returned to his room at end of the session.    Therapy Documentation Precautions:  Precautions Precautions: Fall Precaution Comments: Rt hemiparesis  Restrictions Weight Bearing Restrictions: No    Vital Signs: Therapy Vitals BP: 169/90 mmHg Pain: Pain Assessment Pain Assessment: No/denies pain ADL: ADL ADL Comments: Refer to FIM   See FIM for current functional status  Therapy/Group: Individual Therapy  Harlene Ramus 03/03/2014, 11:46 AM

## 2014-03-03 NOTE — Progress Notes (Signed)
Speech Language Pathology Daily Session Note  Patient Details  Name: Dakota Hall MRN: [id number] Date of Birth: 10/01/1951  Today's Date: 03/03/2014 Time: [id number] Time Calculation (min): 40 min  Short Term Goals: Week 3: SLP Short Term Goal 1 (Week 3): Pt will utilize speech intelligibility strategies at the sentence level with Mod I SLP Short Term Goal 2 (Week 3): Pt will demonstrate complex problem solving during functional task with Supervision level cues SLP Short Term Goal 3 (Week 3): Pt will utilize internal/external memory aids to facilitate working memory during functional tasks with Supervision level cues  Skilled Therapeutic Interventions: Skilled treatment focused on speech and cognitive goals. SLP facilitated session with functional discussion regarding progress in therapies. Pt participated in conversation, using his speech intelligibility strategies at the conversational level with Mod I for increased intelligibility. Pt demonstrated adequate intellectual and anticipatory awareness with Mod I. Pt was observed with mixed consistency boluses (meds whole with thin liquid via straw) with no overt s/s of aspiration, using strategies with Mod I. Pt was emotional throughout session while discussing possible transfer to SNF; SLP provided support and encouragement.   FIM:  Comprehension Comprehension Mode: Auditory Comprehension: 6-Follows complex conversation/direction: With extra time/assistive device Expression Expression Mode: Verbal Expression: 5-Expresses complex 90% of the time/cues < 10% of the time Social Interaction Social Interaction: 6-Interacts appropriately with others with medication or extra time (anti-anxiety, antidepressant). Problem Solving Problem Solving: 5-Solves complex 90% of the time/cues < 10% of the time Memory Memory: 5-Recognizes or recalls 90% of the time/requires cueing < 10% of the time FIM - Eating Eating Activity: 6: Swallowing techniques:  self-managed  Pain Pain Assessment Pain Assessment: No/denies pain  Therapy/Group: Individual Therapy   Maxcine HamLaura Paiewonsky, M.A. CCC-SLP [phone]  Maxcine HamLaura Paiewonsky 03/03/2014, 9:26 AM

## 2014-03-07 NOTE — Progress Notes (Signed)
Recreational Therapy Discharge Summary Patient Details  Name: Dakota Hall MRN: [id number] Date of Birth: 1951-11-05 Today's Date: 03/07/2014  Long term goals set: 1  Long term goals met: 1  Comments on progress toward goals: Pt has made good progress during LOS and discharge to SNF for continued therapies & 24 care.  Pt is discharging at Mod I w/c level & min assist for standing activities.  Pt is motivated to regain independence and return to activities of choice.  Reasons for discharge: discharge from hospital Patient/family agrees with progress made and goals achieved: Yes  Waldon Reining 03/07/2014, 12:39 PM

## 2014-03-14 ENCOUNTER — Inpatient Hospital Stay (HOSPITAL_COMMUNITY)
Admission: EM | Admit: 2014-03-14 | Discharge: 2014-03-22 | DRG: 175 | Disposition: A | Discharge status: Skilled Nursing Facility | Payer: Managed Care, Other (non HMO) | Attending: Pulmonary Disease | Admitting: Pulmonary Disease

## 2014-03-14 ENCOUNTER — Inpatient Hospital Stay (HOSPITAL_COMMUNITY): Payer: Managed Care, Other (non HMO)

## 2014-03-14 ENCOUNTER — Emergency Department (HOSPITAL_COMMUNITY): Payer: Managed Care, Other (non HMO)

## 2014-03-14 DIAGNOSIS — I2699 Other pulmonary embolism without acute cor pulmonale: Secondary | ICD-10-CM

## 2014-03-14 DIAGNOSIS — Y9210 Unspecified residential institution as the place of occurrence of the external cause: Secondary | ICD-10-CM | POA: Diagnosis not present

## 2014-03-14 DIAGNOSIS — I639 Cerebral infarction, unspecified: Secondary | ICD-10-CM

## 2014-03-14 DIAGNOSIS — Z6834 Body mass index (BMI) 34.0-34.9, adult: Secondary | ICD-10-CM

## 2014-03-14 DIAGNOSIS — E872 Acidosis, unspecified: Secondary | ICD-10-CM | POA: Diagnosis present

## 2014-03-14 DIAGNOSIS — E785 Hyperlipidemia, unspecified: Secondary | ICD-10-CM | POA: Diagnosis present

## 2014-03-14 DIAGNOSIS — Z9114 Patient's other noncompliance with medication regimen: Secondary | ICD-10-CM

## 2014-03-14 DIAGNOSIS — D72829 Elevated white blood cell count, unspecified: Secondary | ICD-10-CM | POA: Diagnosis present

## 2014-03-14 DIAGNOSIS — I10 Essential (primary) hypertension: Secondary | ICD-10-CM | POA: Diagnosis present

## 2014-03-14 DIAGNOSIS — E440 Moderate protein-calorie malnutrition: Secondary | ICD-10-CM | POA: Insufficient documentation

## 2014-03-14 DIAGNOSIS — H539 Unspecified visual disturbance: Secondary | ICD-10-CM

## 2014-03-14 DIAGNOSIS — I2692 Saddle embolus of pulmonary artery without acute cor pulmonale: Principal | ICD-10-CM | POA: Diagnosis present

## 2014-03-14 DIAGNOSIS — S301XXA Contusion of abdominal wall, initial encounter: Secondary | ICD-10-CM | POA: Diagnosis not present

## 2014-03-14 DIAGNOSIS — E669 Obesity, unspecified: Secondary | ICD-10-CM | POA: Diagnosis present

## 2014-03-14 DIAGNOSIS — K219 Gastro-esophageal reflux disease without esophagitis: Secondary | ICD-10-CM | POA: Diagnosis present

## 2014-03-14 DIAGNOSIS — I824Y9 Acute embolism and thrombosis of unspecified deep veins of unspecified proximal lower extremity: Secondary | ICD-10-CM | POA: Diagnosis present

## 2014-03-14 DIAGNOSIS — R000 Tachycardia, unspecified: Secondary | ICD-10-CM | POA: Diagnosis present

## 2014-03-14 DIAGNOSIS — Z794 Long term (current) use of insulin: Secondary | ICD-10-CM

## 2014-03-14 DIAGNOSIS — R5381 Other malaise: Secondary | ICD-10-CM | POA: Diagnosis present

## 2014-03-14 DIAGNOSIS — I69959 Hemiplegia and hemiparesis following unspecified cerebrovascular disease affecting unspecified side: Secondary | ICD-10-CM

## 2014-03-14 DIAGNOSIS — Y849 Medical procedure, unspecified as the cause of abnormal reaction of the patient, or of later complication, without mention of misadventure at the time of the procedure: Secondary | ICD-10-CM | POA: Diagnosis not present

## 2014-03-14 DIAGNOSIS — R4781 Slurred speech: Secondary | ICD-10-CM

## 2014-03-14 DIAGNOSIS — I369 Nonrheumatic tricuspid valve disorder, unspecified: Secondary | ICD-10-CM

## 2014-03-14 DIAGNOSIS — E119 Type 2 diabetes mellitus without complications: Secondary | ICD-10-CM | POA: Diagnosis present

## 2014-03-14 DIAGNOSIS — J9600 Acute respiratory failure, unspecified whether with hypoxia or hypercapnia: Secondary | ICD-10-CM | POA: Diagnosis present

## 2014-03-14 DIAGNOSIS — E876 Hypokalemia: Secondary | ICD-10-CM | POA: Diagnosis present

## 2014-03-14 DIAGNOSIS — F418 Other specified anxiety disorders: Secondary | ICD-10-CM

## 2014-03-14 DIAGNOSIS — F341 Dysthymic disorder: Secondary | ICD-10-CM | POA: Diagnosis present

## 2014-03-14 DIAGNOSIS — I6350 Cerebral infarction due to unspecified occlusion or stenosis of unspecified cerebral artery: Secondary | ICD-10-CM

## 2014-03-14 DIAGNOSIS — G4733 Obstructive sleep apnea (adult) (pediatric): Secondary | ICD-10-CM | POA: Diagnosis present

## 2014-03-14 LAB — BASIC METABOLIC PANEL
BUN: 25 mg/dL — ABNORMAL HIGH (ref 6–23)
CALCIUM: 9.6 mg/dL (ref 8.4–10.5)
CO2: 15 mEq/L — ABNORMAL LOW (ref 19–32)
CREATININE: 1.12 mg/dL (ref 0.50–1.35)
Chloride: 101 mEq/L (ref 96–112)
GFR calc Af Amer: 80 mL/min — ABNORMAL LOW (ref >90)
GFR calc non Af Amer: 69 mL/min — ABNORMAL LOW (ref >90)
Glucose, Bld: 252 mg/dL — ABNORMAL HIGH (ref 70–99)
Potassium: 4 mEq/L (ref 3.7–5.3)
Sodium: 138 mEq/L (ref 137–147)

## 2014-03-14 LAB — CBC WITH DIFFERENTIAL/PLATELET
BASOS ABS: 0 10*3/uL (ref 0.0–0.1)
Basophils Relative: 0 % (ref 0–1)
EOS PCT: 1 % (ref 0–5)
Eosinophils Absolute: 0.1 10*3/uL (ref 0.0–0.7)
HCT: 47.5 % (ref 39.0–52.0)
Hemoglobin: 16.7 g/dL (ref 13.0–17.0)
Lymphocytes Relative: 12 % (ref 12–46)
Lymphs Abs: 2 10*3/uL (ref 0.7–4.0)
MCH: 30.8 pg (ref 26.0–34.0)
MCHC: 35.2 g/dL (ref 30.0–36.0)
MCV: 87.6 fL (ref 78.0–100.0)
Monocytes Absolute: 1.1 10*3/uL — ABNORMAL HIGH (ref 0.1–1.0)
Monocytes Relative: 7 % (ref 3–12)
NEUTROS ABS: 13.3 10*3/uL — AB (ref 1.7–7.7)
Neutrophils Relative %: 80 % — ABNORMAL HIGH (ref 43–77)
Platelets: 139 10*3/uL — ABNORMAL LOW (ref 150–400)
RBC: 5.42 MIL/uL (ref 4.22–5.81)
RDW: 12.9 % (ref 11.5–15.5)
WBC: 16.5 10*3/uL — ABNORMAL HIGH (ref 4.0–10.5)

## 2014-03-14 LAB — URINALYSIS, ROUTINE W REFLEX MICROSCOPIC
GLUCOSE, UA: 500 mg/dL — AB
Hgb urine dipstick: NEGATIVE
KETONES UR: 15 mg/dL — AB
Leukocytes, UA: NEGATIVE
Nitrite: NEGATIVE
PH: 5.5 (ref 5.0–8.0)
Protein, ur: 100 mg/dL — AB
Specific Gravity, Urine: 1.028 (ref 1.005–1.030)
Urobilinogen, UA: 1 mg/dL (ref 0.0–1.0)

## 2014-03-14 LAB — I-STAT ARTERIAL BLOOD GAS, ED
Acid-base deficit: 6 mmol/L — ABNORMAL HIGH (ref 0.0–2.0)
Bicarbonate: 16.8 mEq/L — ABNORMAL LOW (ref 20.0–24.0)
O2 Saturation: 92 %
Patient temperature: 98.6
TCO2: 18 mmol/L (ref 0–100)
pCO2 arterial: 26.1 mmHg — ABNORMAL LOW (ref 35.0–45.0)
pH, Arterial: 7.416 (ref 7.350–7.450)
pO2, Arterial: 62 mmHg — ABNORMAL LOW (ref 80.0–100.0)

## 2014-03-14 LAB — PROTIME-INR
INR: 1.2 (ref 0.00–1.49)
Prothrombin Time: 14.9 seconds (ref 11.6–15.2)

## 2014-03-14 LAB — I-STAT TROPONIN, ED: TROPONIN I, POC: 0.13 ng/mL — AB (ref 0.00–0.08)

## 2014-03-14 LAB — I-STAT CG4 LACTIC ACID, ED: Lactic Acid, Venous: 1.72 mmol/L (ref 0.5–2.2)

## 2014-03-14 LAB — HEPARIN LEVEL (UNFRACTIONATED): HEPARIN UNFRACTIONATED: 0.21 [IU]/mL — AB (ref 0.30–0.70)

## 2014-03-14 LAB — MRSA PCR SCREENING: MRSA by PCR: NEGATIVE

## 2014-03-14 LAB — PRO B NATRIURETIC PEPTIDE: Pro B Natriuretic peptide (BNP): 5474 pg/mL — ABNORMAL HIGH (ref 0–125)

## 2014-03-14 LAB — GLUCOSE, CAPILLARY
Glucose-Capillary: 167 mg/dL — ABNORMAL HIGH (ref 70–99)
Glucose-Capillary: 159 mg/dL — ABNORMAL HIGH (ref 70–99)

## 2014-03-14 LAB — URINE MICROSCOPIC-ADD ON

## 2014-03-14 LAB — APTT: aPTT: 35 s (ref 24–37)

## 2014-03-14 MED ORDER — MIDAZOLAM HCL 2 MG/2ML IJ SOLN
INTRAMUSCULAR | Status: AC
  Filled 2014-03-14: qty 4

## 2014-03-14 MED ORDER — MIDAZOLAM HCL 2 MG/2ML IJ SOLN
INTRAMUSCULAR | Status: AC | PRN
  Administered 2014-03-14: 2 mg via INTRAVENOUS

## 2014-03-14 MED ORDER — INSULIN ASPART 100 UNIT/ML ~~LOC~~ SOLN
0.0000 [IU] | SUBCUTANEOUS | Status: DC
  Administered 2014-03-14 (×2): 3 [IU] via SUBCUTANEOUS
  Administered 2014-03-15 (×2): 2 [IU] via SUBCUTANEOUS
  Administered 2014-03-15: 3 [IU] via SUBCUTANEOUS

## 2014-03-14 MED ORDER — IOHEXOL 300 MG/ML  SOLN
100.0000 mL | Freq: Once | INTRAMUSCULAR | Status: AC | PRN
  Administered 2014-03-14: 10 mL via INTRAVENOUS

## 2014-03-14 MED ORDER — SODIUM CHLORIDE 0.9 % IV BOLUS (SEPSIS)
500.0000 mL | Freq: Once | INTRAVENOUS | Status: AC
  Administered 2014-03-14: 500 mL via INTRAVENOUS

## 2014-03-14 MED ORDER — CLOPIDOGREL BISULFATE 75 MG PO TABS
75.0000 mg | ORAL_TABLET | Freq: Every day | ORAL | Status: DC
  Filled 2014-03-14: qty 1

## 2014-03-14 MED ORDER — SERTRALINE HCL 100 MG PO TABS
300.0000 mg | ORAL_TABLET | Freq: Every day | ORAL | Status: DC
  Administered 2014-03-14 – 2014-03-22 (×9): 300 mg via ORAL
  Filled 2014-03-14 (×9): qty 3

## 2014-03-14 MED ORDER — HEPARIN BOLUS VIA INFUSION
2000.0000 [IU] | Freq: Once | INTRAVENOUS | Status: AC
  Administered 2014-03-14: 2000 [IU] via INTRAVENOUS
  Filled 2014-03-14: qty 2000

## 2014-03-14 MED ORDER — SODIUM CHLORIDE 0.9 % IV SOLN
250.0000 mL | INTRAVENOUS | Status: DC | PRN
  Administered 2014-03-15: 500 mL via INTRAVENOUS

## 2014-03-14 MED ORDER — SODIUM CHLORIDE 0.9 % IV SOLN: 250.0000 mL | INTRAVENOUS | Status: DC | PRN

## 2014-03-14 MED ORDER — SODIUM CHLORIDE 0.9 % IV BOLUS (SEPSIS)
1000.0000 mL | Freq: Once | INTRAVENOUS | Status: AC
  Administered 2014-03-14: 1000 mL via INTRAVENOUS

## 2014-03-14 MED ORDER — FENTANYL CITRATE 0.05 MG/ML IJ SOLN
INTRAMUSCULAR | Status: AC
  Filled 2014-03-14: qty 4

## 2014-03-14 MED ORDER — SODIUM CHLORIDE 0.9 % IV SOLN
12.0000 mg | Freq: Once | INTRAVENOUS | Status: AC
  Administered 2014-03-15: 12 mg via INTRAVENOUS
  Filled 2014-03-14: qty 12

## 2014-03-14 MED ORDER — ACETAMINOPHEN 500 MG PO TABS
1000.0000 mg | ORAL_TABLET | Freq: Once | ORAL | Status: AC
  Administered 2014-03-14: 1000 mg via ORAL
  Filled 2014-03-14: qty 2

## 2014-03-14 MED ORDER — HEPARIN (PORCINE) IN NACL 100-0.45 UNIT/ML-% IJ SOLN
1400.0000 [IU]/h | INTRAMUSCULAR | Status: DC
  Administered 2014-03-14: 1100 [IU]/h via INTRAVENOUS
  Administered 2014-03-15: 1400 [IU]/h via INTRAVENOUS
  Filled 2014-03-14 (×4): qty 250

## 2014-03-14 MED ORDER — SODIUM CHLORIDE 0.9 % IJ SOLN
3.0000 mL | Freq: Two times a day (BID) | INTRAMUSCULAR | Status: DC
  Administered 2014-03-14 – 2014-03-15 (×3): 3 mL via INTRAVENOUS

## 2014-03-14 MED ORDER — SODIUM CHLORIDE 0.9 % IJ SOLN: 3.0000 mL | INTRAMUSCULAR | Status: DC | PRN

## 2014-03-14 MED ORDER — IOHEXOL 350 MG/ML SOLN
80.0000 mL | Freq: Once | INTRAVENOUS | Status: AC | PRN
  Administered 2014-03-14: 80 mL via INTRAVENOUS

## 2014-03-14 MED ORDER — ASPIRIN 81 MG PO CHEW
324.0000 mg | CHEWABLE_TABLET | Freq: Once | ORAL | Status: AC
  Administered 2014-03-14: 324 mg via ORAL
  Filled 2014-03-14: qty 4

## 2014-03-14 MED ORDER — SODIUM CHLORIDE 0.9 % IV SOLN
INTRAVENOUS | Status: DC
  Administered 2014-03-14 – 2014-03-19 (×10): via INTRAVENOUS

## 2014-03-14 MED ORDER — PANTOPRAZOLE SODIUM 40 MG PO TBEC
40.0000 mg | DELAYED_RELEASE_TABLET | Freq: Every day | ORAL | Status: DC
  Administered 2014-03-15 – 2014-03-21 (×8): 40 mg via ORAL
  Filled 2014-03-14 (×8): qty 1

## 2014-03-14 MED ORDER — FENTANYL CITRATE 0.05 MG/ML IJ SOLN
INTRAMUSCULAR | Status: AC | PRN
  Administered 2014-03-14: 50 ug via INTRAVENOUS

## 2014-03-14 MED ORDER — SODIUM CHLORIDE 0.9 % IV SOLN
INTRAVENOUS | Status: DC
  Administered 2014-03-15: 12:00:00 via INTRAVENOUS

## 2014-03-14 MED ORDER — SODIUM CHLORIDE 0.9 % IV SOLN
INTRAVENOUS | Status: DC
  Administered 2014-03-15: via INTRAVENOUS

## 2014-03-14 NOTE — ED Notes (Signed)
IV Team called to start second line on patient.

## 2014-03-14 NOTE — ED Notes (Signed)
Pt returned from xray

## 2014-03-14 NOTE — ED Notes (Signed)
Phlebotomy at the bedside  

## 2014-03-14 NOTE — Progress Notes (Signed)
eLink Physician-Brief Progress Note Patient Name: Dakota AnoRoger W [name] DOB: 01/29/1951 MRN: [id number]  Date of Service  03/14/2014   HPI/Events of Note   Saddle PE TEE >>> RV dilation / strain / failure H/o non-hemorrhagic CVA   eICU Interventions   IR consultation for possible catheter-guided lysis    Intervention Category Major Interventions: Other:  Dakota [name] 03/14/2014, 6:20 PM

## 2014-03-14 NOTE — ED Notes (Signed)
IV Team at the bedside. 

## 2014-03-14 NOTE — ED Notes (Signed)
Patient transported to CT 

## 2014-03-14 NOTE — ED Notes (Signed)
Results of troponin given to Dr. Ghim 

## 2014-03-14 NOTE — Procedures (Signed)
Successful placement of bilateral PA infusion caths for TPA LYSIS Will initiate lysis at 1mg /hr via each cath for 12 hours(total dose 24mg ) No comp Stable Follow up PA pressure and cath removal in 12 hours

## 2014-03-14 NOTE — Progress Notes (Signed)
ANTICOAGULATION CONSULT NOTE - Initial Consult  Pharmacy Consult for heparin Indication: chest pain/ACS/SOB  Allergies  Allergen Reactions  . Penicillins Hives    Patient Measurements: Height: 5' 8.9" (175 cm) Weight: 233 lb 11 oz (106 kg) IBW/kg (Calculated) : 70.47 Heparin Dosing Weight: 92kg  Vital Signs: Temp: 97 F (36.1 C) (04/28 1100) Temp src: Axillary (04/28 1100) BP: 147/94 mmHg (04/28 1200) Pulse Rate: 121 (04/28 1200)  Labs:  Recent Labs  03/14/14 1130  HGB 16.7  HCT 47.5  PLT 139*  APTT 35  LABPROT 14.9  INR 1.20  CREATININE 1.12    Estimated Creatinine Clearance: 81.9 ml/min (by C-G formula based on Cr of 1.12).   Medical History: Past Medical History  Diagnosis Date  . Diabetes mellitus   . Hypertension   . Stroke July 2013    left paramedian pontine, incidental right parietal subcortical. both d/t small vessel disease  . Stroke March 2015    left basal ganglia secondary to small vessel disease  . Obesity   . Depression with anxiety   . OSA on CPAP     Assessment: [age] YOM with history of recent CVA (02/03/2014) who is brought in from rehab facility with SOB, diaphoresis and chest tightness. He is on Plavix PTA but no anticoagulation. Baseline INR 1.2, aPTT 35 seconds, hgb 16.7 and platelets 139. No bleeding noted on arrival. Initial troponin elevated. No changes on EKG.  Goal of Therapy:  Heparin level 0.3-0.7 units/ml Monitor platelets by anticoagulation protocol: Yes   Plan:  1. Heparin loading dose with 2000 units IV x1 2. Start heparin drip at 1100 units/hr 3. Check heparin level in 6 hours 4. Daily heparin level and CBC 5. Follow for s/s bleeding and patient work-up  Kamori Barbier D. Leonore Frankson, PharmD, BCPS Clinical Pharmacist Pager: [phone] 03/14/2014 1:12 PM

## 2014-03-14 NOTE — ED Notes (Addendum)
Pt from [facility] with c/o shortness of breath during PT related to a recent second stroke with right sided deficits.  Staff reported 74% on RA, placed on 3L and returned 92%. Pt c/o chest pain yesterday, given 2 nitro at that time with relief. Staff did a troponin I at that time, 0.34 results and 12 lead showing ST.  12 lead unremarkable, denies chest pain today. In NAD.

## 2014-03-14 NOTE — ED Notes (Signed)
Admitting MD at the bedside.  

## 2014-03-14 NOTE — ED Notes (Signed)
IV attempted. Unable to gain access. Christin BachBrittney Berard, RN at the bedside trying to gain IV access.

## 2014-03-14 NOTE — ED Provider Notes (Signed)
CSN: [id number]     Arrival date & time 03/14/14  1047 History   First MD Initiated Contact with Patient 03/14/14 1051     Chief Complaint  Patient presents with  . Shortness of Breath     (Consider location/radiation/quality/duration/timing/severity/associated sxs/prior Treatment) HPI Dakota [name] Is a 63 year old male who is brought in via EMS for acute shortness of breath.  The patient was admitted on 02/03/2014 for a new CVA.  The patient is currently in a rehabilitation facility.  He has some speech deficit secondary to his recent stroke.  Patient also has gait deficit and is unable to walk on his own.  The patient states that yesterday he was doing rehabilitation I am going from sitting to standing.  After his session the patient went to breakfast and had a sudden onset acute shortness of breath with associated diaphoresis and chest tightness.  He was given 2 nitroglycerin and had relief of his symptoms.  This morning the patient was doing physical therapy stay again with a walker when he had sudden onset acute severe or shortness of breath with associated diaphoresis.  He denies chest pain, pleuritic pain, nausea.  He has no known cardiac history.  The patient does have a history of hypertension, diabetes, obesity and hypertension.  The patient is currently taking Plavix as directed.  He denies any unilateral leg swelling or calf pain.  Patient has been less mobile 2 to his stroke.  He denies any productive cough, fevers, chills.  Past Medical History  Diagnosis Date  . Diabetes mellitus   . Hypertension   . Stroke July 2013    left paramedian pontine, incidental right parietal subcortical. both d/t small vessel disease  . Stroke March 2015    left basal ganglia secondary to small vessel disease  . Obesity   . Depression with anxiety   . OSA on CPAP    Past Surgical History  Procedure Laterality Date  . Tonsillectomy    . Anal fissure repair     Family History  Problem  Relation Age of Onset  . Cancer Mother   . Heart disease Father    History  Substance Use Topics  . Smoking status: Never Smoker   . Smokeless tobacco: Never Used  . Alcohol Use: No    Review of Systems Ten systems reviewed and are negative for acute change, except as noted in the HPI.     Allergies  Penicillins  Home Medications   Prior to Admission medications   Medication Sig Start Date End Date Taking? Authorizing Provider  amLODipine (NORVASC) 10 MG tablet Take 10 mg by mouth daily.   Yes Historical Provider, MD  ARIPiprazole (ABILIFY) 2 MG tablet Take 2 mg by mouth daily.   Yes Historical Provider, MD  baclofen (LIORESAL) 20 MG tablet Take 1 tablet (20 mg total) by mouth 3 (three) times daily. 03/03/14  Yes Evlyn KannerPamela S Love, PA-C  clopidogrel (PLAVIX) 75 MG tablet Take 1 tablet (75 mg total) by mouth daily with breakfast. 03/03/14  Yes Evlyn KannerPamela S Love, PA-C  insulin aspart (NOVOLOG) 100 UNIT/ML injection Inject 2-12 Units into the skin 3 (three) times daily before meals. BCG: 151-200= 2 units 201-250= 4 units 251-300= 6 units 301-350= 8 units 351-400= 10 units 401 > 12 units   Yes Historical Provider, MD  LORazepam (ATIVAN) 0.5 MG tablet Take 1 tablet (0.5 mg total) by mouth every 6 (six) hours as needed for anxiety. 03/03/14  Yes Jacquelynn CreePamela S Love, PA-C  metFORMIN (GLUCOPHAGE) 500 MG tablet Take 1,000 mg by mouth daily.   Yes Historical Provider, MD  pantoprazole (PROTONIX) 40 MG tablet Take 1 tablet (40 mg total) by mouth at bedtime. 03/03/14  Yes Evlyn Kanner Love, PA-C  pioglitazone (ACTOS) 15 MG tablet Take 15 mg by mouth daily.   Yes Historical Provider, MD  polyethylene glycol (MIRALAX / GLYCOLAX) packet Take 17 g by mouth daily. 03/03/14  Yes Evlyn Kanner Love, PA-C  potassium chloride (K-DUR,KLOR-CON) 10 MEQ tablet Take 1 tablet (10 mEq total) by mouth 2 (two) times daily. 03/03/14  Yes Evlyn Kanner Love, PA-C  sertraline (ZOLOFT) 100 MG tablet Take 300 mg by mouth daily.    Yes  Historical Provider, MD  simvastatin (ZOCOR) 20 MG tablet Take 1 tablet (20 mg total) by mouth daily at 6 PM. 05/18/12  Yes Calvert Cantor, MD  traZODone (DESYREL) 50 MG tablet Take 50 mg by mouth daily as needed for sleep. For sleep   Yes Historical Provider, MD  valsartan-hydrochlorothiazide (DIOVAN-HCT) 160-12.5 MG per tablet Take 1 tablet by mouth daily.    Yes Historical Provider, MD  Vitamin D, Ergocalciferol, (DRISDOL) 50000 UNITS CAPS capsule 50,000 Units every 7 (seven) days.  06/26/13  Yes Historical Provider, MD   BP 125/89  Pulse 120  Temp(Src) 97 F (36.1 C) (Axillary)  Resp 30  SpO2 95% Physical Exam Physical Exam  Nursing note and vitals reviewed. Constitutional: Pale, sallow male who appears distressed. HENT:  Head: Normocephalic and atraumatic.  Eyes: Conjunctivae normal are normal. No scleral icterus.  Neck: Normal range of motion. Neck supple.  Cardiovascular: Tachycardic regular rhythm and normal heart sounds.   Pulmonary/Chest: Patient to To 33 breaths per minute. Abdominal: Soft. There is no tenderness.  Musculoskeletal: He exhibits no edema.  Neurological: He is alert. Oriented. His speech is dysarthric. Skin: Skin is warm and dry. He is not diaphoretic.  Psychiatric: His behavior is normal.    ED Course  Procedures (including critical care time) Labs Review Labs Reviewed  CBC WITH DIFFERENTIAL - Abnormal; Notable for the following:    WBC 16.5 (*)    Platelets 139 (*)    Neutrophils Relative % 80 (*)    Neutro Abs 13.3 (*)    Monocytes Absolute 1.1 (*)    All other components within normal limits  BASIC METABOLIC PANEL - Abnormal; Notable for the following:    CO2 15 (*)    Glucose, Bld 252 (*)    BUN 25 (*)    GFR calc non Af Amer 69 (*)    GFR calc Af Amer 80 (*)    All other components within normal limits  PRO B NATRIURETIC PEPTIDE - Abnormal; Notable for the following:    Pro B Natriuretic peptide (BNP) 5474.0 (*)    All other components within  normal limits  I-STAT TROPOININ, ED - Abnormal; Notable for the following:    Troponin i, poc 0.13 (*)    All other components within normal limits  URINALYSIS, ROUTINE W REFLEX MICROSCOPIC  PROTIME-INR  APTT    Imaging Review No results found.   EKG Interpretation   Date/Time:  Tuesday March 14 2014 10:53:04 EDT Ventricular Rate:  119 PR Interval:  154 QRS Duration: 106 QT Interval:  338 QTC Calculation: 476 R Axis:   102 Text Interpretation:  Sinus tachycardia Right axis deviation Borderline  prolonged QT interval Baseline wander probably repol abn Abnormal ekg  Confirmed by [facility]  MD, MICHEAL ([id number]) on 03/14/2014 12:36:03  PM        MDM   Final diagnoses:  None    12:31 PM Filed Vitals:   03/14/14 1051 03/14/14 1100 03/14/14 1101 03/14/14 1200  BP:  125/89  147/94  Pulse:  118  121  Temp:  97 F (36.1 C)    TempSrc:  Axillary    Resp:  37  24  SpO2: 94% 95% 95% 93%    Patient here with acute onset shortness of breath.  He does have an elevated troponin at 0.013.  Patient also has a leukocytosis along with elevated blood sugar her bicarbonate is low at 15 and an anion gap of 22.  Differential diagnosis includes acute coronary syndrome, pulmonary embolus, DKA. The patient is without pain.  I doubt dissecting aneurysm.  He denies any back pain or tearing sensation.  Patient states his only symptom is extreme discomfort due to shortness of breath. And according to EMS reports.  During patient's acute dyspnea he was shin to have an oxygen saturation that dropped down to 74%.  Patient is a greater than 90% on 4 L via nasal cannula. Patient will receive heparin.  Fluid bolus ordered.  ABG ordered.  Urine is pending.  Patient is an elevated Pro BNP.  Patient's EKG shows no acute abnormalities at this time. EKG unchanged except for tachycardia. No signs of acute ischemia   1:11 PM BP 147/94  Pulse 121  Temp(Src) 100.7 F (38.2 C) (Rectal)  Resp 24  Ht 5' 8.9"  (1.75 m)  Wt 233 lb 11 oz (106 kg)  BMI 34.61 kg/m2  SpO2 93% Patient found to have a rectal temp of 100.7. Blood culture and lactate level added.tyleonol given.    Patient with Saddle Embolus and large clot Burden on CTA. Will be admitted to critical care.  Dr. Oletta Lamasghim aware and patient seen in shared visit. With Dr. Oletta LamasGhim who has called for admission.   Arthor CaptainAbigail Loomis Anacker, PA-C 03/17/14 [phone]

## 2014-03-14 NOTE — Consult Note (Signed)
Reason for Consult: acute saddle embolus(large bilateral clot burden) Referring Physician: Dr. Illa Level EMANUELL [name] is an 63 y.o. male.  HPI: symptomatic acute large saddle embolus extending into both main pulmonary arteries.  Evidence of Rt heart strain by CTA and echo.  Patient is a candidate for PE catheter directed lysis.    Past Medical History  Diagnosis Date  . Diabetes mellitus   . Hypertension   . Stroke July 2013    left paramedian pontine, incidental right parietal subcortical. both d/t small vessel disease  . Stroke March 2015    left basal ganglia secondary to small vessel disease  . Obesity   . Depression with anxiety   . OSA on CPAP     Past Surgical History  Procedure Laterality Date  . Tonsillectomy    . Anal fissure repair      Family History  Problem Relation Age of Onset  . Cancer Mother   . Heart disease Father     Social History:  reports that he has never smoked. He has never used smokeless tobacco. He reports that he does not drink alcohol or use illicit drugs.  Allergies:  Allergies  Allergen Reactions  . Penicillins Hives    Medications: I have reviewed the patient's current medications.  Results for orders placed during the hospital encounter of 03/14/14 (from the past 48 hour(s))  CBC WITH DIFFERENTIAL     Status: Abnormal   Collection Time    03/14/14 11:30 AM      Result Value Ref Range   WBC 16.5 (*) 4.0 - 10.5 K/uL   RBC 5.42  4.22 - 5.81 MIL/uL   Hemoglobin 16.7  13.0 - 17.0 g/dL   HCT 47.5  39.0 - 52.0 %   MCV 87.6  78.0 - 100.0 fL   MCH 30.8  26.0 - 34.0 pg   MCHC 35.2  30.0 - 36.0 g/dL   RDW 12.9  11.5 - 15.5 %   Platelets 139 (*) 150 - 400 K/uL   Neutrophils Relative % 80 (*) 43 - 77 %   Neutro Abs 13.3 (*) 1.7 - 7.7 K/uL   Lymphocytes Relative 12  12 - 46 %   Lymphs Abs 2.0  0.7 - 4.0 K/uL   Monocytes Relative 7  3 - 12 %   Monocytes Absolute 1.1 (*) 0.1 - 1.0 K/uL   Eosinophils Relative 1  0 - 5 %   Eosinophils Absolute 0.1  0.0 - 0.7 K/uL   Basophils Relative 0  0 - 1 %   Basophils Absolute 0.0  0.0 - 0.1 K/uL  BASIC METABOLIC PANEL     Status: Abnormal   Collection Time    03/14/14 11:30 AM      Result Value Ref Range   Sodium 138  137 - 147 mEq/L   Potassium 4.0  3.7 - 5.3 mEq/L   Chloride 101  96 - 112 mEq/L   CO2 15 (*) 19 - 32 mEq/L   Glucose, Bld 252 (*) 70 - 99 mg/dL   BUN 25 (*) 6 - 23 mg/dL   Creatinine, Ser 1.12  0.50 - 1.35 mg/dL   Calcium 9.6  8.4 - 10.5 mg/dL   GFR calc non Af Amer 69 (*) >90 mL/min   GFR calc Af Amer 80 (*) >90 mL/min   Comment: (NOTE)     The eGFR has been calculated using the CKD EPI equation.     This calculation has not been validated  in all clinical situations.     eGFR's persistently <90 mL/min signify possible Chronic Kidney     Disease.  PRO B NATRIURETIC PEPTIDE     Status: Abnormal   Collection Time    03/14/14 11:30 AM      Result Value Ref Range   Pro B Natriuretic peptide (BNP) 5474.0 (*) 0 - 125 pg/mL  PROTIME-INR     Status: None   Collection Time    03/14/14 11:30 AM      Result Value Ref Range   Prothrombin Time 14.9  11.6 - 15.2 seconds   INR 1.20  0.00 - 1.49  APTT     Status: None   Collection Time    03/14/14 11:30 AM      Result Value Ref Range   aPTT 35  24 - 37 seconds  I-STAT TROPOININ, ED     Status: Abnormal   Collection Time    03/14/14 11:38 AM      Result Value Ref Range   Troponin i, poc 0.13 (*) 0.00 - 0.08 ng/mL   Comment NOTIFIED PHYSICIAN     Comment 3            Comment: Due to the release kinetics of cTnI,     a negative result within the first hours     of the onset of symptoms does not rule out     myocardial infarction with certainty.     If myocardial infarction is still suspected,     repeat the test at appropriate intervals.  URINALYSIS, ROUTINE W REFLEX MICROSCOPIC     Status: Abnormal   Collection Time    03/14/14 12:03 PM      Result Value Ref Range   Color, Urine AMBER (*) YELLOW    Comment: BIOCHEMICALS MAY BE AFFECTED BY COLOR   APPearance CLEAR  CLEAR   Specific Gravity, Urine 1.028  1.005 - 1.030   pH 5.5  5.0 - 8.0   Glucose, UA 500 (*) NEGATIVE mg/dL   Hgb urine dipstick NEGATIVE  NEGATIVE   Bilirubin Urine MODERATE (*) NEGATIVE   Ketones, ur 15 (*) NEGATIVE mg/dL   Protein, ur 100 (*) NEGATIVE mg/dL   Urobilinogen, UA 1.0  0.0 - 1.0 mg/dL   Nitrite NEGATIVE  NEGATIVE   Leukocytes, UA NEGATIVE  NEGATIVE  URINE MICROSCOPIC-ADD ON     Status: Abnormal   Collection Time    03/14/14 12:03 PM      Result Value Ref Range   Squamous Epithelial / LPF RARE  RARE   WBC, UA 0-2  <3 WBC/hpf   RBC / HPF 0-2  <3 RBC/hpf   Bacteria, UA FEW (*) RARE   Casts HYALINE CASTS (*) NEGATIVE   Urine-Other MUCOUS PRESENT    I-STAT ARTERIAL BLOOD GAS, ED     Status: Abnormal   Collection Time    03/14/14  1:23 PM      Result Value Ref Range   pH, Arterial 7.416  7.350 - 7.450   pCO2 arterial 26.1 (*) 35.0 - 45.0 mmHg   pO2, Arterial 62.0 (*) 80.0 - 100.0 mmHg   Bicarbonate 16.8 (*) 20.0 - 24.0 mEq/L   TCO2 18  0 - 100 mmol/L   O2 Saturation 92.0     Acid-base deficit 6.0 (*) 0.0 - 2.0 mmol/L   Patient temperature 98.6 F     Collection site RADIAL, ALLEN'S TEST ACCEPTABLE     Drawn by Mining engineer  Sample type ARTERIAL    I-STAT CG4 LACTIC ACID, ED     Status: None   Collection Time    03/14/14  2:43 PM      Result Value Ref Range   Lactic Acid, Venous 1.72  0.5 - 2.2 mmol/L  GLUCOSE, CAPILLARY     Status: Abnormal   Collection Time    03/14/14  5:13 PM      Result Value Ref Range   Glucose-Capillary 167 (*) 70 - 99 mg/dL  MRSA PCR SCREENING     Status: None   Collection Time    03/14/14  5:36 PM      Result Value Ref Range   MRSA by PCR NEGATIVE  NEGATIVE   Comment:            The GeneXpert MRSA Assay (FDA     approved for NASAL specimens     only), is one component of a     comprehensive MRSA colonization     surveillance program. It is not     intended  to diagnose MRSA     infection nor to guide or     monitor treatment for     MRSA infections.  GLUCOSE, CAPILLARY     Status: Abnormal   Collection Time    03/14/14  7:23 PM      Result Value Ref Range   Glucose-Capillary 159 (*) 70 - 99 mg/dL  HEPARIN LEVEL (UNFRACTIONATED)     Status: Abnormal   Collection Time    03/14/14  8:04 PM      Result Value Ref Range   Heparin Unfractionated 0.21 (*) 0.30 - 0.70 IU/mL   Comment:            IF HEPARIN RESULTS ARE BELOW     EXPECTED VALUES, AND PATIENT     DOSAGE HAS BEEN CONFIRMED,     SUGGEST FOLLOW UP TESTING     OF ANTITHROMBIN III LEVELS.    Dg Chest 2 View  03/14/2014   CLINICAL DATA:  Short of breath  EXAM: CHEST  2 VIEW  COMPARISON:  DG CHEST 1V PORT dated 02/03/2014; DG CHEST 2 VIEW dated 08/09/2007; DG CHEST 2 VIEW dated 05/15/2012  FINDINGS: Stable enlarged cardiac silhouette. There are low lung volumes. No effusion, infiltrate, or pneumothorax. There is a compression fracture at L1 which appears chronic but is new from 05/15/2012 with approximately 25% loss of vertebral body height.  IMPRESSION: 1. Cardiomegaly and low lung volumes. No acute cardiopulmonary findings. 2. Compression fractures at L1 appears chronic but is new from 05/15/2012.   Electronically Signed   By: Suzy Bouchard M.D.   On: 03/14/2014 11:59   Ct Head Wo Contrast  03/14/2014   CLINICAL DATA:  Right-sided weakness. Recent left lenticulostriate infarct. Assess for hemorrhagic transformation prior to thrombolysis of pulmonary emboli.  EXAM: CT HEAD WITHOUT CONTRAST  TECHNIQUE: Contiguous axial images were obtained from the base of the skull through the vertex without contrast.  COMPARISON:  MR HEAD W/O CM dated 02/04/2014; CT ANGIO HEAD W/CM &/OR WO/CM dated 02/03/2014; CT ANGIO CHEST W/CM &/OR WO/CM dated 03/14/2014  FINDINGS: No evidence for acute cortical or subcortical infarction. No mass lesion or hydrocephalus. No extra-axial fluid.  Extensive white matter  hypoattenuation representing small vessel disease is stable.  The roughly 2 cm region of previously acute infarction affecting the lateral putamen and periventricular white matter, demonstrated on MR from 02/04/2014, is now isodense with the chronic microvascular ischemic change  noted elsewhere, and displays a typical subacute appearance. No evidence for acute hemorrhage. No subarachnoid or intraventricular blood. Moderate dolichoectasia of the intracranial vasculature without CT signs of proximal vascular thrombosis.  Calvarium intact. No acute sinus or mastoid disease. Negative orbits.  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 No evidence for hemorrhagic transformation. No abnormalities to suggest interval acute cerebral ischemia.   Electronically Signed   By: Rolla Flatten M.D.   On: 03/14/2014 20:40   Ct Angio Chest Pe W/cm &/or Wo Cm  03/14/2014   CLINICAL DATA:  Shortness of Breath, recent stroke.  Chest pain.  EXAM: CT ANGIOGRAPHY CHEST WITH CONTRAST  TECHNIQUE: Multidetector CT imaging of the chest was performed using the standard protocol during bolus administration of intravenous contrast. Multiplanar CT image reconstructions and MIPs were obtained to evaluate the vascular anatomy.  CONTRAST:  58m OMNIPAQUE IOHEXOL 350 MG/ML SOLN  COMPARISON:  None.  FINDINGS: There is a large saddle embolus with near occlusive extension into the right lower lobe and lingular branches of the pulmonary artery and partially occlusive clot into the left lower lobe branches. There is left ventricular hypertrophy. The right ventricular diameter is markedly wider than the left and there is reflux of contrast from the right atrium into the central aspect of hepatic veins consistent with right heart strain/failure. No pleural or pericardial effusion. No hilar or mediastinal adenopathy. There is a 9 mm  wedge-shaped pleural-based opacity in the anterior right upper lobe. There is subsegmental atelectasis medially in the superior segment left lower lobe and there is minimal subsegmental atelectasis in both lung bases. No focal airspace consolidation. Visualized portions of upper abdomen unremarkable. Minimal spurring in the lower thoracic spine.  Review of the MIP images confirms the above findings.  IMPRESSION: 1. Massive bilateral pulmonary emboli with saddle embolus and evidence of right heart strain/failure. Critical Value/emergent results were called by telephone at the time of interpretation on 03/14/2014 at 2:09 PM to Dr. GDorna Mai who verbally acknowledged these results.   Electronically Signed   By: DArne ClevelandM.D.   On: 03/14/2014 14:11    Review of Systems  Constitutional: Negative for fever and chills.  Eyes: Negative for blurred vision.  Respiratory: Positive for shortness of breath.   Cardiovascular: Negative for chest pain.  Gastrointestinal: Negative for heartburn, nausea and vomiting.  Neurological: Negative for headaches.   Blood pressure 116/71, pulse 103, temperature 98.6 F (37 C), temperature source Oral, resp. rate 32, height '5\' 8"'  (1.727 m), weight 226 lb 3.1 oz (102.6 kg), SpO2 94.00%. Physical Exam  Constitutional: He is oriented to person, place, and time. He appears well-developed and well-nourished. He appears distressed.  Cardiovascular: Normal rate and regular rhythm.  Exam reveals no gallop.   No murmur heard. Respiratory: He is in respiratory distress. He has wheezes.  Laborer breathing  GI: Soft. Bowel sounds are normal. He exhibits no distension. There is no tenderness. There is no rebound.  Neurological: He is alert and oriented to person, place, and time.  Rt hemiparesis     Assessment/Plan: Acute saddle/bilateral PE with right heart strain by CTA and echo.  Tachycardic with laborer breathing.  Pt is a candidate for cath directed PE lysis.  Echo, LLE  doppler, EKG, and head CT all reviewed.  Procedure, risks, benefits, and alternatives reviewed.  Details of the lysis therapy discussed with the patient and family.  Goals are to decrease the  PA pressure and RT heart strain to preserve RV function.  All questions addressed with family and patient.  Consent obtained.  Will start procedure tonight per protocol. Discussed with Dr. Earnest Conroy at [place] 03/14/2014, 9:45 PM

## 2014-03-14 NOTE — Sedation Documentation (Addendum)
Left main pulmonary artery 75/28 (47)

## 2014-03-14 NOTE — ED Notes (Signed)
Cammy CopaAbigail, PA and Student at the bedside.

## 2014-03-14 NOTE — ED Notes (Signed)
Patient transported to X-ray 

## 2014-03-14 NOTE — Progress Notes (Addendum)
ANTICOAGULATION CONSULT NOTE - Follow Up Consult  Pharmacy Consult for heparin Indication: chest pain / ACS + PE  Allergies  Allergen Reactions  . Penicillins Hives    Patient Measurements: Height: 5\' 8"  (172.7 cm) Weight: 226 lb 3.1 oz (102.6 kg) IBW/kg (Calculated) : 68.4 Heparin Dosing Weight: 92 kg  Vital Signs: Temp: 99.1 F (37.3 C) (04/28 1721) Temp src: Rectal (04/28 1721) BP: 117/84 mmHg (04/28 1800) Pulse Rate: 107 (04/28 1800)  Labs:  Recent Labs  03/14/14 1130  HGB 16.7  HCT 47.5  PLT 139*  APTT 35  LABPROT 14.9  INR 1.20  CREATININE 1.12    Estimated Creatinine Clearance: 79.4 ml/min (by C-G formula based on Cr of 1.12).   Medications:  Scheduled:  . [START ON 03/15/2014] clopidogrel  75 mg Oral Q breakfast  . insulin aspart  0-15 Units Subcutaneous 6 times per day  . pantoprazole  40 mg Oral QHS  . sertraline  300 mg Oral Daily   Infusions:  . sodium chloride 100 mL/hr at 03/14/14 1654  . heparin 1,100 Units/hr (03/14/14 1414)    Assessment: 63 yo male with chest pain and ACS + PE is currently on subtherapeutic heparin.  Heparin level is 0.21.  Patient will also be started on bilateral U/S guided lysis of PE with alteplase infusion.  Per protocol, keep the heparin goal the same.    Goal of Therapy:  Heparin level 0.3-0.7 units/ml Monitor platelets by anticoagulation protocol: Yes   Plan:  1) Increase heparin drip to 1250 units/hr.  2) Check an 6 hr heparin level after drip rate is changed 3) F/u stopping heparin at the end of alteplase infusion  Tsz-Yin Samyra Limb 03/14/2014,7:03 PM

## 2014-03-14 NOTE — ED Notes (Signed)
Dr. Ghim at the bedside.  

## 2014-03-14 NOTE — ED Notes (Signed)
Patient returned from xray.

## 2014-03-14 NOTE — H&P (Signed)
PULMONARY / CRITICAL CARE MEDICINE   Name: Dakota [name] MRN: [id number] DOB: 05/22/1951    ADMISSION DATE:  03/14/2014 CONSULTATION DATE:  4/28   REFERRING MD :  EDP PRIMARY SERVICE: Pulm   CHIEF COMPLAINT:  Acute resp failure   BRIEF PATIENT DESCRIPTION:  63 year old male, residing at  S/p recent CVA w/ right sided motor deficits. Began to c/o CP and worsening dyspnea firt noted on 4/27 during PT. O2 sats recorded in 70% range on room air. Sent to ER 4/28 for evaluation.  Dx eval showed massive saddle embolus w/ evidence or right heart strain. PCCM asked to admit.   SIGNIFICANT EVENTS / STUDIES:  CT chest 4/28: Massive bilateral pulmonary emboli with saddle embolus and evidence of right heart strain/failure. ECHO 4/28>>> LE US 4/28>>>  LINES / TUBES:   CULTURES:   ANTIBIOTICS:   HISTORY OF PRESENT ILLNESS:   63 year old male, residing at  S/p recent CVA w/ right sided motor deficits. Was at Rehab facility participating on physical therapy when staff reported  O2 sats 74% on RA, placed on 3L and returned 92%. On evaluation in the ER the pt reported shortness of breath during PT with Chest pain  First noted on 4/27, was given 2 nitro at that time with relief. Staff did a troponin I at that time, 0.34 results and 12 lead showing ST. 12 lead unremarkable, denies chest pain today. In NAD. She was sent to the ER for further evaluation. Dx eval showed massive saddle embolus w/ evidence or right heart strain. PCCM asked to admit.    PAST MEDICAL HISTORY :  Past Medical History  Diagnosis Date  . Diabetes mellitus   . Hypertension   . Stroke July 2013    left paramedian pontine, incidental right parietal subcortical. both d/t small vessel disease  . Stroke March 2015    left basal ganglia secondary to small vessel disease  . Obesity   . Depression with anxiety   . OSA on CPAP    Past Surgical History  Procedure Laterality Date  . Tonsillectomy    . Anal fissure repair      Prior to Admission medications   Medication Sig Start Date End Date Taking? Authorizing Provider  amLODipine (NORVASC) 10 MG tablet Take 10 mg by mouth daily.   Yes Historical Provider, MD  ARIPiprazole (ABILIFY) 2 MG tablet Take 2 mg by mouth daily.   Yes Historical Provider, MD  baclofen (LIORESAL) 20 MG tablet Take 1 tablet (20 mg total) by mouth 3 (three) times daily. 03/03/14  Yes Evlyn KannerPamela S Love, PA-C  clopidogrel (PLAVIX) 75 MG tablet Take 1 tablet (75 mg total) by mouth daily with breakfast. 03/03/14  Yes Evlyn KannerPamela S Love, PA-C  insulin aspart (NOVOLOG) 100 UNIT/ML injection Inject 2-12 Units into the skin 3 (three) times daily before meals. BCG: 151-200= 2 units 201-250= 4 units 251-300= 6 units 301-350= 8 units 351-400= 10 units 401 > 12 units   Yes Historical Provider, MD  LORazepam (ATIVAN) 0.5 MG tablet Take 1 tablet (0.5 mg total) by mouth every 6 (six) hours as needed for anxiety. 03/03/14  Yes Evlyn KannerPamela S Love, PA-C  metFORMIN (GLUCOPHAGE) 500 MG tablet Take 1,000 mg by mouth daily.   Yes Historical Provider, MD  pantoprazole (PROTONIX) 40 MG tablet Take 1 tablet (40 mg total) by mouth at bedtime. 03/03/14  Yes Evlyn KannerPamela S Love, PA-C  pioglitazone (ACTOS) 15 MG tablet Take 15 mg by mouth daily.  Yes Historical Provider, MD  polyethylene glycol (MIRALAX / GLYCOLAX) packet Take 17 g by mouth daily. 03/03/14  Yes Evlyn Kanner Love, PA-C  potassium chloride (K-DUR,KLOR-CON) 10 MEQ tablet Take 1 tablet (10 mEq total) by mouth 2 (two) times daily. 03/03/14  Yes Evlyn Kanner Love, PA-C  sertraline (ZOLOFT) 100 MG tablet Take 300 mg by mouth daily.    Yes Historical Provider, MD  simvastatin (ZOCOR) 20 MG tablet Take 1 tablet (20 mg total) by mouth daily at 6 PM. 05/18/12  Yes Calvert Cantor, MD  traZODone (DESYREL) 50 MG tablet Take 50 mg by mouth daily as needed for sleep. For sleep   Yes Historical Provider, MD  valsartan-hydrochlorothiazide (DIOVAN-HCT) 160-12.5 MG per tablet Take 1 tablet by mouth daily.     Yes Historical Provider, MD  Vitamin D, Ergocalciferol, (DRISDOL) 50000 UNITS CAPS capsule 50,000 Units every 7 (seven) days.  06/26/13  Yes Historical Provider, MD   Allergies  Allergen Reactions  . Penicillins Hives    FAMILY HISTORY:  Family History  Problem Relation Age of Onset  . Cancer Mother   . Heart disease Father    SOCIAL HISTORY:  reports that he has never smoked. He has never used smokeless tobacco. He reports that he does not drink alcohol or use illicit drugs.  Review of Systems:   Bolds are positive  Constitutional: weight loss, gain, night sweats, Fevers, chills, fatigue .  HEENT: headaches, Sore throat, sneezing, nasal congestion, post nasal drip, Difficulty swallowing, Tooth/dental problems, visual complaints visual changes, ear ache CV:  chest pain, radiates: ,Orthopnea, PND, swelling in lower extremities, dizziness, palpitations, pre-syncope.  GI  heartburn, indigestion, abdominal pain, nausea, vomiting, diarrhea, change in bowel habits, loss of appetite, bloody stools.  Resp: cough, productive: , hemoptysis, dyspnea, chest pain, pleuritic.  Skin: rash or itching or icterus GU: dysuria, change in color of urine, urgency or frequency. flank pain, hematuria  MS: joint pain or swelling, lower extremities. decreased range of motion  Psych: change in mood or affect. depression or anxiety.  Neuro: difficulty with speech, weakness, numbness, ataxia, weakness on right   SUBJECTIVE:   VITAL SIGNS: Temp:  [97 F (36.1 C)-100.7 F (38.2 C)] 100.7 F (38.2 C) (04/28 1307) Pulse Rate:  [118-124] 121 (04/28 1330) Resp:  [24-39] 39 (04/28 1330) BP: (125-149)/(88-101) 141/88 mmHg (04/28 1330) SpO2:  [92 %-95 %] 93 % (04/28 1330) Weight:  [106 kg (233 lb 11 oz)] 106 kg (233 lb 11 oz) (04/28 1300) HEMODYNAMICS:   VENTILATOR SETTINGS:   INTAKE / OUTPUT: Intake/Output   None     PHYSICAL EXAMINATION: General:  Mild accessory muscle use, + tachypnea  Neuro:   Awake, oriented, some dysarthria, unable to move RUE, has some RLE residual weakness.  HEENT:  [place], no JVD Cardiovascular:  Tachy rrr  Lungs: + tachypnea  + accessory muscle use. Decreased breath sounds  Abdomen:  Soft, non-tender + bowel sounds  Musculoskeletal:   Skin:  Minimal RLE and ankle edema   LABS:  CBC  Recent Labs Lab 03/14/14 1130  WBC 16.5*  HGB 16.7  HCT 47.5  PLT 139*   Coag's  Recent Labs Lab 03/14/14 1130  APTT 35  INR 1.20   BMET  Recent Labs Lab 03/14/14 1130  NA 138  K 4.0  CL 101  CO2 15*  BUN 25*  CREATININE 1.12  GLUCOSE 252*   Electrolytes  Recent Labs Lab 03/14/14 1130  CALCIUM 9.6   Sepsis Markers No results found  for this basename: LATICACIDVEN, PROCALCITON, O2SATVEN,  in the last 168 hours ABG  Recent Labs Lab 03/14/14 1323  PHART 7.416  PCO2ART 26.1*  PO2ART 62.0*   Liver Enzymes No results found for this basename: AST, ALT, ALKPHOS, BILITOT, ALBUMIN,  in the last 168 hours Cardiac Enzymes  Recent Labs Lab 03/14/14 1130  PROBNP 5474.0*   Glucose No results found for this basename: GLUCAP,  in the last 168 hours  Imaging Dg Chest 2 View  03/14/2014   CLINICAL DATA:  Short of breath  EXAM: CHEST  2 VIEW  COMPARISON:  DG CHEST 1V PORT dated 02/03/2014; DG CHEST 2 VIEW dated 08/09/2007; DG CHEST 2 VIEW dated 05/15/2012  FINDINGS: Stable enlarged cardiac silhouette. There are low lung volumes. No effusion, infiltrate, or pneumothorax. There is a compression fracture at L1 which appears chronic but is new from 05/15/2012 with approximately 25% loss of vertebral body height.  IMPRESSION: 1. Cardiomegaly and low lung volumes. No acute cardiopulmonary findings. 2. Compression fractures at L1 appears chronic but is new from 05/15/2012.   Electronically Signed   By: Genevive Bi M.D.   On: 03/14/2014 11:59   Ct Angio Chest Pe W/cm &/or Wo Cm  03/14/2014   CLINICAL DATA:  Shortness of Breath, recent stroke.  Chest pain.   EXAM: CT ANGIOGRAPHY CHEST WITH CONTRAST  TECHNIQUE: Multidetector CT imaging of the chest was performed using the standard protocol during bolus administration of intravenous contrast. Multiplanar CT image reconstructions and MIPs were obtained to evaluate the vascular anatomy.  CONTRAST:  80mL OMNIPAQUE IOHEXOL 350 MG/ML SOLN  COMPARISON:  None.  FINDINGS: There is a large saddle embolus with near occlusive extension into the right lower lobe and lingular branches of the pulmonary artery and partially occlusive clot into the left lower lobe branches. There is left ventricular hypertrophy. The right ventricular diameter is markedly wider than the left and there is reflux of contrast from the right atrium into the central aspect of hepatic veins consistent with right heart strain/failure. No pleural or pericardial effusion. No hilar or mediastinal adenopathy. There is a 9 mm wedge-shaped pleural-based opacity in the anterior right upper lobe. There is subsegmental atelectasis medially in the superior segment left lower lobe and there is minimal subsegmental atelectasis in both lung bases. No focal airspace consolidation. Visualized portions of upper abdomen unremarkable. Minimal spurring in the lower thoracic spine.  Review of the MIP images confirms the above findings.  IMPRESSION: 1. Massive bilateral pulmonary emboli with saddle embolus and evidence of right heart strain/failure. Critical Value/emergent results were called by telephone at the time of interpretation on 03/14/2014 at 2:09 PM to Dr. Oletta Lamas, who verbally acknowledged these results.   Electronically Signed   By: Oley Balm M.D.   On: 03/14/2014 14:11     CXR: no acute/ low volume   ASSESSMENT / PLAN:  PULMONARY A: acute hypoxic respiratory failure in setting of massive pulmonary emboli following recent hospitalization/rehab for acute CVA. Almost certainly this is the provoking issue.  P:   Wean FIO2 Heparin IV Stat ECHO LE  dopplers Not a good candidate for TPA given recent BG CVA Will d/w IR re: cath directed TPA    CARDIOVASCULAR A: elevated CEs in setting of PE  P:  Admit to ICU Tele  See above  Hold antihypertensives Cont plavix   RENAL A:  Metabolic acidosis + AG P:   Ck lactic acid   GASTROINTESTINAL A:   Obesity  P:  NPO Advance diet when clinically able   HEMATOLOGIC A:  Mild leukocytosis  P:  Trend fever curve  Trend cbc   INFECTIOUS A:  No acute  P:   Trend fever curve   ENDOCRINE A:   DM P:   ssi   NEUROLOGIC A:  H/o CVA w/ right sided deficits  P:   Resume rehab efforts when hemodynamics support this   TODAY'S SUMMARY:  High risk for clinical worsening. Concerned about recent CVA being a relative contraindication to TPA. Will admit to ICU. Speak w/ stroke MD. Re: contraindications re: TPA. He looks remarkably good considering his clot burden.    Zenia ResidesPeter Babcock, ACNP Pulmonary and Critical Care Medicine [facility] Pager: [phone]  03/14/2014, 2:31 PM   PCCM ATTENDING: I have interviewed and examined the patient and reviewed the database. I have formulated the assessment and plan as reflected in the note above with amendments made by me.   Will communicate with Stroke MD to clarify risk of thrombolytic therapy. In general, recent nonhemorrhagic CVA is a relative contra-indication to thrombolysis. As his BP is stable, will Rx with UFH only for now. If he were to develop evidence of recurrent embolization or of hemodynamic deterioration, the risk/benefit profile could change in favor of fibrinolysis. I have discussed all of this with pt and his wife  Dakota Fischeravid Simonds, MD;  PCCM service; Mobile [phone]

## 2014-03-14 NOTE — ED Notes (Signed)
Lactic acid results called to nurse Dakota [name] ClientHannah

## 2014-03-14 NOTE — Progress Notes (Signed)
  Echocardiogram 2D Echocardiogram has been performed.  Real ConsChristy F Yurani [name] 03/14/2014, 5:50 PM

## 2014-03-14 NOTE — ED Provider Notes (Addendum)
Medical screening examination/treatment/procedure(s) were conducted as a shared visit with non-physician practitioner(s) and myself.  I personally evaluated the patient during the encounter.   EKG Interpretation   Date/Time:  Tuesday March 14 2014 10:53:04 EDT Ventricular Rate:  119 PR Interval:  154 QRS Duration: 106 QT Interval:  338 QTC Calculation: 476 R Axis:   102 Text Interpretation:  Sinus tachycardia Right axis deviation Borderline  prolonged QT interval Baseline wander probably repol abn Abnormal ekg  Confirmed by [facility]  MD, MICHEAL ([id number]) on 03/14/2014 12:36:03 PM      Pt with mild resp distress, tachycardic and tachypneic.  No rales on exam.  HR is regular at 120. PT has SOB, possibly desaturation with EMS by history.   Here, pt is 93-94% on O2 supplement by [place].  Pt denies CP.  Pt has been immobile relatively due to recent stroke.  Will get CT chest to r/o PE given SOB, tachycardia, hypoxia.  Otherwise ECG shows no STEMI, but troponin is mildly elevated.  No prior h/o CHF, but BNP is up.  Could be CHF related troponin leak.  Will continue to run serial troponins, monitor and will need admission.  Anion gap present with CO2 of 15, will give IVF's gently as well.  UA pending.  Will check for ketones.      CRITICAL CARE Performed by: Gavin PoundMichael Y. Ruthellen Tippy Total critical care time: 40 min Critical care time was exclusive of separately billable procedures and treating other patients. Critical care was necessary to treat or prevent imminent or life-threatening deterioration. Critical care was time spent personally by me on the following activities: development of treatment plan with patient and/or surrogate as well as nursing, discussions with consultants, evaluation of patient's response to treatment, examination of patient, obtaining history from patient or surrogate, ordering and performing treatments and interventions, ordering and review of laboratory studies, ordering and review of  radiographic studies, pulse oximetry and re-evaluation of patient's condition.      Gavin PoundMichael Y. Cyerra Yim, MD 03/14/14 1239    2:06 PM I reviewed chest CT which shows saddle embolism.  IV heparin was already begun. Right heart strain on CT which confirms suspicion given tachycardia and elevated troponin.  Could be a TPA candidate but had a ischemic stroke on 3/20.  Will discuss with Pulmonary/intensivist for recommendations, likely admission to either ICU or step down.    Gavin PoundMichael Y. Johnnye Sandford, MD 03/14/14 1408

## 2014-03-14 NOTE — Progress Notes (Signed)
*  PRELIMINARY RESULTS* Vascular Ultrasound Bilateral lower extremity venous duplex has been completed.  Preliminary findings: Right:  Acute DVT noted throughout popliteal vein. Age indeterminate DVT noted throughout PTV. Acute SVT noted involving proximal segment of LSV.  No Baker's cyst. Left:  No evidence of DVT, superficial thrombosis, or Baker's cyst.   Neysa BonitoChristy F Benjie Ricketson 03/14/2014, 7:09 PM

## 2014-03-15 ENCOUNTER — Inpatient Hospital Stay (HOSPITAL_COMMUNITY): Payer: Managed Care, Other (non HMO)

## 2014-03-15 ENCOUNTER — Encounter (HOSPITAL_COMMUNITY): Payer: Self-pay

## 2014-03-15 DIAGNOSIS — I10 Essential (primary) hypertension: Secondary | ICD-10-CM

## 2014-03-15 DIAGNOSIS — I6350 Cerebral infarction due to unspecified occlusion or stenosis of unspecified cerebral artery: Secondary | ICD-10-CM

## 2014-03-15 DIAGNOSIS — E440 Moderate protein-calorie malnutrition: Secondary | ICD-10-CM | POA: Insufficient documentation

## 2014-03-15 DIAGNOSIS — E119 Type 2 diabetes mellitus without complications: Secondary | ICD-10-CM

## 2014-03-15 LAB — CBC
HCT: 42.1 % (ref 39.0–52.0)
HCT: 43.4 % (ref 39.0–52.0)
HCT: 37.8 % — ABNORMAL LOW (ref 39.0–52.0)
HEMATOCRIT: 37.5 % — AB (ref 39.0–52.0)
Hemoglobin: 14.6 g/dL (ref 13.0–17.0)
Hemoglobin: 14.7 g/dL (ref 13.0–17.0)
Hemoglobin: 13.2 g/dL (ref 13.0–17.0)
Hemoglobin: 13.2 g/dL (ref 13.0–17.0)
MCH: 30.8 pg (ref 26.0–34.0)
MCH: 30.3 pg (ref 26.0–34.0)
MCH: 30.7 pg (ref 26.0–34.0)
MCH: 30.5 pg (ref 26.0–34.0)
MCHC: 33.9 g/dL (ref 30.0–36.0)
MCHC: 34.7 g/dL (ref 30.0–36.0)
MCHC: 34.9 g/dL (ref 30.0–36.0)
MCHC: 35.2 g/dL (ref 30.0–36.0)
MCV: 86.6 fL (ref 78.0–100.0)
MCV: 87.9 fL (ref 78.0–100.0)
MCV: 88.8 fL (ref 78.0–100.0)
MCV: 89.5 fL (ref 78.0–100.0)
PLATELETS: 124 10*3/uL — AB (ref 150–400)
Platelets: 117 10*3/uL — ABNORMAL LOW (ref 150–400)
Platelets: 99 10*3/uL — ABNORMAL LOW (ref 150–400)
Platelets: 93 10*3/uL — ABNORMAL LOW (ref 150–400)
RBC: 4.3 MIL/uL (ref 4.22–5.81)
RBC: 4.33 MIL/uL (ref 4.22–5.81)
RBC: 4.74 MIL/uL (ref 4.22–5.81)
RBC: 4.85 MIL/uL (ref 4.22–5.81)
RDW: 12.8 % (ref 11.5–15.5)
RDW: 13 % (ref 11.5–15.5)
RDW: 13.1 % (ref 11.5–15.5)
RDW: 13.3 % (ref 11.5–15.5)
WBC: 10.2 10*3/uL (ref 4.0–10.5)
WBC: 11.8 10*3/uL — AB (ref 4.0–10.5)
WBC: 14.2 10*3/uL — ABNORMAL HIGH (ref 4.0–10.5)
WBC: 14.5 10*3/uL — ABNORMAL HIGH (ref 4.0–10.5)

## 2014-03-15 LAB — BASIC METABOLIC PANEL
BUN: 25 mg/dL — AB (ref 6–23)
CALCIUM: 9.1 mg/dL (ref 8.4–10.5)
CO2: 23 mEq/L (ref 19–32)
CREATININE: 1.23 mg/dL (ref 0.50–1.35)
Chloride: 111 mEq/L (ref 96–112)
GFR calc Af Amer: 71 mL/min — ABNORMAL LOW (ref >90)
GFR, EST NON AFRICAN AMERICAN: 61 mL/min — AB (ref >90)
GLUCOSE: 138 mg/dL — AB (ref 70–99)
Potassium: 3.8 mEq/L (ref 3.7–5.3)
SODIUM: 149 meq/L — AB (ref 137–147)

## 2014-03-15 LAB — FIBRINOGEN
FIBRINOGEN: 337 mg/dL (ref 204–475)
FIBRINOGEN: 355 mg/dL (ref 204–475)
FIBRINOGEN: 376 mg/dL (ref 204–475)
Fibrinogen: 412 mg/dL (ref 204–475)

## 2014-03-15 LAB — GLUCOSE, CAPILLARY
GLUCOSE-CAPILLARY: 103 mg/dL — AB (ref 70–99)
GLUCOSE-CAPILLARY: 169 mg/dL — AB (ref 70–99)
Glucose-Capillary: 128 mg/dL — ABNORMAL HIGH (ref 70–99)
Glucose-Capillary: 123 mg/dL — ABNORMAL HIGH (ref 70–99)
Glucose-Capillary: 109 mg/dL — ABNORMAL HIGH (ref 70–99)

## 2014-03-15 LAB — HEPARIN LEVEL (UNFRACTIONATED)
HEPARIN UNFRACTIONATED: 0.23 [IU]/mL — AB (ref 0.30–0.70)
HEPARIN UNFRACTIONATED: 0.26 [IU]/mL — AB (ref 0.30–0.70)
Heparin Unfractionated: 0.34 IU/mL (ref 0.30–0.70)
Heparin Unfractionated: 0.29 IU/mL — ABNORMAL LOW (ref 0.30–0.70)

## 2014-03-15 MED ORDER — BIOTENE DRY MOUTH MT LIQD
15.0000 mL | Freq: Two times a day (BID) | OROMUCOSAL | Status: DC
  Administered 2014-03-15 – 2014-03-22 (×14): 15 mL via OROMUCOSAL

## 2014-03-15 MED ORDER — INSULIN ASPART 100 UNIT/ML ~~LOC~~ SOLN
0.0000 [IU] | Freq: Three times a day (TID) | SUBCUTANEOUS | Status: DC
  Administered 2014-03-16 – 2014-03-17 (×3): 1 [IU] via SUBCUTANEOUS
  Administered 2014-03-18: 2 [IU] via SUBCUTANEOUS
  Administered 2014-03-18 – 2014-03-19 (×4): 1 [IU] via SUBCUTANEOUS
  Administered 2014-03-19: 2 [IU] via SUBCUTANEOUS
  Administered 2014-03-20 (×2): 1 [IU] via SUBCUTANEOUS
  Administered 2014-03-20: 2 [IU] via SUBCUTANEOUS
  Administered 2014-03-21 – 2014-03-22 (×5): 1 [IU] via SUBCUTANEOUS

## 2014-03-15 MED ORDER — BOOST / RESOURCE BREEZE PO LIQD
1.0000 | Freq: Every day | ORAL | Status: DC
  Administered 2014-03-16: 1 via ORAL
  Filled 2014-03-15: qty 1

## 2014-03-15 MED ORDER — HEPARIN (PORCINE) IN NACL 100-0.45 UNIT/ML-% IJ SOLN
1550.0000 [IU]/h | INTRAMUSCULAR | Status: DC
  Administered 2014-03-15: 1400 [IU]/h via INTRAVENOUS
  Administered 2014-03-16: 1550 [IU]/h via INTRAVENOUS
  Filled 2014-03-15 (×4): qty 250

## 2014-03-15 NOTE — Progress Notes (Signed)
ANTICOAGULATION CONSULT NOTE - Follow Up Consult  Pharmacy Consult for heparin Indication: pulmonary embolus and ACS  Labs:  Recent Labs  03/14/14 1130 03/14/14 2004 03/14/14 2332 03/15/14 0532  HGB 16.7  --  14.6 14.7  HCT 47.5  --  42.1 43.4  PLT 139*  --  124* PENDING  APTT 35  --   --   --   LABPROT 14.9  --   --   --   INR 1.20  --   --   --   HEPARINUNFRC  --  0.21* 0.26* 0.34  CREATININE 1.12  --   --  1.23    Assessment: 63yo male now therapeutic on heparin though labs being drawn frequently so current level likely does not fully represent rate, plenty of room for level to move up and stay within goal range.  Will continue gtt at current rate and confirm with next Q6H level.   Vernard GamblesVeronda Sahej Hauswirth, PharmD, BCPS  03/15/2014,6:41 AM

## 2014-03-15 NOTE — Progress Notes (Signed)
PULMONARY / CRITICAL CARE MEDICINE   Name: Dakota Hall MRN: [id number] DOB: 1951-09-22    ADMISSION DATE:  03/14/2014 CONSULTATION DATE:  03/14/2014  REFERRING MD : EDP  PRIMARY SERVICE: Pulm   CHIEF COMPLAINT: Acute resp failure   BRIEF PATIENT DESCRIPTION:  63 year old male, residing at S/p recent CVA w/ right sided motor deficits. Began to c/o CP and worsening dyspnea firt noted on 4/27 during PT. O2 sats recorded in 70% range on room air. Sent to ER 4/28 for evaluation. Dx eval showed massive saddle embolus w/ evidence or right heart strain. PCCM asked to admit.   SIGNIFICANT EVENTS / STUDIES:  CT chest 4/28: Massive bilateral pulmonary emboli with saddle embolus and evidence of right heart strain/failure.  ECHO 4/28 >>> EF 55-60%, RV hemodynamic compromise from probable large saddle pulmonary embolus.  There is thrombus in transit in the RA.   LE Doppler US 4/28 >>> acute DVT throughout popliteal vein; age indeterminate DVT throughout posterior tibial vein; acute SVT involving proximal segment of lesser saphenous vein.  LINES / TUBES:  04/28 B/L PA infusion catheters  CULTURES:  Blood cultures x 2  04/28>>>  ANTIBIOTICS: None   INTERVAL HISTORY:  Catheter directed tPA initiated overnight.  Patient hemodynamically stable.  SpO2 mid-upper 90s on 4L via [place].  He denies dyspnea, chest pain or leg pain currently.  VITAL SIGNS: Temp:  [97 F (36.1 C)-100.7 F (38.2 C)] 98.2 F (36.8 C) (04/29 0427) Pulse Rate:  [71-124] 93 (04/29 0600) Resp:  [11-39] 31 (04/29 0600) BP: (102-151)/(60-105) 123/84 mmHg (04/29 0600) SpO2:  [90 %-98 %] 97 % (04/29 0600) FiO2 (%):  [4 %] 4 % (04/29 0400) Weight:  [102.6 kg (226 lb 3.1 oz)-106 kg (233 lb 11 oz)] 104.5 kg (230 lb 6.1 oz) (04/29 0400)  HEMODYNAMICS:   VENTILATOR SETTINGS:  INTAKE / OUTPUT: Intake/Output     04/28 0701 - 04/29 0700   P.O. 240   I.V. (mL/kg) 2299.1 (22)   Total Intake(mL/kg) 2539.1 (24.3)   Urine (mL/kg/hr)  900   Total Output 900   Net +1639.1        PHYSICAL EXAMINATION: General:  In NAD Neuro:  AAO x 3, responding appropriately HEENT:  WNL Cardiovascular:  RRR Lungs:  Decreased BS at the bases anteriorly, otherwise clear Abdomen:  + BS, soft, NT, ND Musculoskeletal:  Residual right sided weakness, RUE > RLE Skin:  Trace lower extremity edema R > L  LABS:  CBC  Recent Labs Lab 03/14/14 1130 03/14/14 2332 03/15/14 0532  WBC 16.5* 14.5* 14.2*  HGB 16.7 14.6 14.7  HCT 47.5 42.1 43.4  PLT 139* 124* PENDING   Coag's  Recent Labs Lab 03/14/14 1130  APTT 35  INR 1.20   BMET  Recent Labs Lab 03/14/14 1130 03/15/14 0532  NA 138 149*  K 4.0 3.8  CL 101 111  CO2 15* 23  BUN 25* 25*  CREATININE 1.12 1.23  GLUCOSE 252* 138*   Electrolytes  Recent Labs Lab 03/14/14 1130 03/15/14 0532  CALCIUM 9.6 9.1   Sepsis Markers  Recent Labs Lab 03/14/14 1443  LATICACIDVEN 1.72   ABG  Recent Labs Lab 03/14/14 1323  PHART 7.416  PCO2ART 26.1*  PO2ART 62.0*   Cardiac Enzymes  Recent Labs Lab 03/14/14 1130  PROBNP 5474.0*   Glucose  Recent Labs Lab 03/14/14 1713 03/14/14 1923 03/15/14 0039 03/15/14 0340  GLUCAP 167* 159* 169* 128*    Imaging Dg Chest 2 View  03/14/2014   CLINICAL DATA:  Short of breath  EXAM: CHEST  2 VIEW  COMPARISON:  DG CHEST 1V PORT dated 02/03/2014; DG CHEST 2 VIEW dated 08/09/2007; DG CHEST 2 VIEW dated 05/15/2012  FINDINGS: Stable enlarged cardiac silhouette. There are low lung volumes. No effusion, infiltrate, or pneumothorax. There is a compression fracture at L1 which appears chronic but is new from 05/15/2012 with approximately 25% loss of vertebral body height.  IMPRESSION: 1. Cardiomegaly and low lung volumes. No acute cardiopulmonary findings. 2. Compression fractures at L1 appears chronic but is new from 05/15/2012.   Electronically Signed   By: Genevive BiStewart  Edmunds M.D.   On: 03/14/2014 11:59   Ct Head Wo  Contrast  03/14/2014   CLINICAL DATA:  Right-sided weakness. Recent left lenticulostriate infarct. Assess for hemorrhagic transformation prior to thrombolysis of pulmonary emboli.  EXAM: CT HEAD WITHOUT CONTRAST  TECHNIQUE: Contiguous axial images were obtained from the base of the skull through the vertex without contrast.  COMPARISON:  MR HEAD W/O CM dated 02/04/2014; CT ANGIO HEAD W/CM &/OR WO/CM dated 02/03/2014; CT ANGIO CHEST W/CM &/OR WO/CM dated 03/14/2014  FINDINGS: No evidence for acute cortical or subcortical infarction. No mass lesion or hydrocephalus. No extra-axial fluid.  Extensive white matter hypoattenuation representing small vessel disease is stable.  The roughly 2 cm region of previously acute infarction affecting the lateral putamen and periventricular white matter, demonstrated on MR from 02/04/2014, is now isodense with the chronic microvascular ischemic change noted elsewhere, and displays a typical subacute appearance. No evidence for acute hemorrhage. No subarachnoid or intraventricular blood. Moderate dolichoectasia of the intracranial vasculature without CT signs of proximal vascular thrombosis.  Calvarium intact. No acute sinus or mastoid disease. Negative orbits.  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 No evidence for hemorrhagic transformation. No abnormalities to suggest interval acute cerebral ischemia.   Electronically Signed   By: Davonna BellingJohn  Curnes M.D.   On: 03/14/2014 20:40   Ct Angio Chest Pe W/cm &/or Wo Cm  03/14/2014   ADDENDUM REPORT: 03/14/2014 21:47  ADDENDUM: The RV/LV ratio is 3.15.  Signed,  Sterling BigHeath K. McCullough, MD  [facility]  [facility]   Electronically Signed   By: Malachy MoanHeath  McCullough M.D.   On: 03/14/2014 21:47   03/14/2014   CLINICAL DATA:  Shortness of Breath, recent stroke.  Chest pain.  EXAM: CT  ANGIOGRAPHY CHEST WITH CONTRAST  TECHNIQUE: Multidetector CT imaging of the chest was performed using the standard protocol during bolus administration of intravenous contrast. Multiplanar CT image reconstructions and MIPs were obtained to evaluate the vascular anatomy.  CONTRAST:  80mL OMNIPAQUE IOHEXOL 350 MG/ML SOLN  COMPARISON:  None.  FINDINGS: There is a large saddle embolus with near occlusive extension into the right lower lobe and lingular branches of the pulmonary artery and partially occlusive clot into the left lower lobe branches. There is left ventricular hypertrophy. The right ventricular diameter is markedly wider than the left and there is reflux of contrast from the right atrium into the central aspect of hepatic veins consistent with right heart strain/failure. No pleural or pericardial effusion. No hilar or mediastinal adenopathy. There is a 9 mm wedge-shaped pleural-based opacity in the anterior right upper lobe. There is subsegmental atelectasis medially in the superior segment left lower lobe and there is minimal subsegmental atelectasis in both lung bases. No focal airspace consolidation. Visualized portions of upper abdomen  unremarkable. Minimal spurring in the lower thoracic spine.  Review of the MIP images confirms the above findings.  IMPRESSION: 1. Massive bilateral pulmonary emboli with saddle embolus and evidence of right heart strain/failure. Critical Value/emergent results were called by telephone at the time of interpretation on 03/14/2014 at 2:09 PM to Dr. Oletta LamasGhim, who verbally acknowledged these results.  Electronically Signed: By: Oley Balmaniel  Hassell M.D. On: 03/14/2014 14:11    ASSESSMENT / PLAN: PULMONARY  A: Acute hypoxic respiratory failure in setting of massive B/L pulmonary emboli following recent hospitalization/rehab for acute CVA.  P:  Wean FiO2  Heparin IV  Continue catheter directed tPA, to be removed and reimaged 4/29 am   CARDIOVASCULAR  A: Elevated CEs in  setting of PE  P:  Telemetry Treating PE as above Hold antihypertensives  Continue Plavix    RENAL  A: Metabolic acidosis + AG, resolving; lactic acid WNL P:  Monitor BMP   GASTROINTESTINAL  A:  No acute abnormality  P:  Clear liquid diet, advance as tolerated   HEMATOLOGIC  A: Mild leukocytosis  P:  Trend fever curve  Trend CBC   INFECTIOUS  A: No acute abnormality P:  Trend fever curve    ENDOCRINE  A:  DM  P:  SSI CBG monitoring   NEUROLOGIC  A: H/o CVA w/ right sided deficits  P:  Resume rehab efforts when hemodynamically stable    Evelena PeatAlex Wilson, DO IMTS, PGY1 03/15/2014, 6:38 AM  CC time 35 minutes  Levy Pupaobert Maelyn Berrey, MD, PhD 03/15/2014, 9:40 AM [facility] [phone] or if no answer [phone]

## 2014-03-15 NOTE — Progress Notes (Signed)
CSW consult to pt/family regarding concerns about returning to nursing facility, [facility] [phone]. CSW spoke with pt/family to provide support. CSW also spoke with Melissa in admissions to report that it is the pt's intention to return to facility. Admissions reported [facility] Facility will be notified when pt is to discharge. CSW shared information with pt/family and they were appreciative of CSW efforts.   [address] CircleDoris Breahna Boylen, [place] [phone]

## 2014-03-15 NOTE — Progress Notes (Signed)
Pt to IR for pulmonary lysis follow up. Right PA pressure 45/20 MAP 28; Left PA pressure 51/20 MAP 30.  Ekos catheter and system removed. Pt tolerated well, VVS, denies any pain or discomfort. Right groin sheath pulled and manual pressure held. There is a level 1 hematoma noted, MD aware. Will continue to monitor pt closely until transferred back to ICU.

## 2014-03-15 NOTE — Progress Notes (Signed)
ANTICOAGULATION CONSULT NOTE - Follow Up Consult  Pharmacy Consult for heparin Indication: pulmonary embolus and ACS  Labs:  Recent Labs  03/14/14 1130 03/14/14 2004 03/14/14 2332  HGB 16.7  --  14.6  HCT 47.5  --  42.1  PLT 139*  --  124*  APTT 35  --   --   LABPROT 14.9  --   --   INR 1.20  --   --   HEPARINUNFRC  --  0.21* 0.26*  CREATININE 1.12  --   --     Assessment: 63yo male remains subtherapeutic on heparin after rate increase; no issues per RN after tPA lysis infusion started.  Goal of Therapy:  Heparin level 0.3-0.7 units/ml   Plan:  Will increase heparin gtt by 1-2 units/kg/hr to 1400 units/hr until next level.  Vernard GamblesVeronda Daniil Labarge, PharmD, BCPS  03/15/2014,2:34 AM

## 2014-03-15 NOTE — Progress Notes (Signed)
Utilization review completed. Tamasha Laplante, RN, BSN. 

## 2014-03-15 NOTE — Procedures (Signed)
PE lysis check post 12 hours Pressures: R PA 45/19, mean 28 mm Hg L PA 50/20, mean 30 mm Hg Small R groin hematoma

## 2014-03-15 NOTE — Progress Notes (Signed)
INITIAL NUTRITION ASSESSMENT  DOCUMENTATION CODES Per approved criteria  -Non-severe (moderate) malnutrition in the context of acute illness or injury -Obesity unspecified    Pt meets criteria for non-severe (moderate) MALNUTRITION in the context of acute illness/injury as evidenced by weight loss of 8.7% / 1 month and <75% of estimated energy requirements > 7 days.   INTERVENTION: Diet advancement per MD  Resource Breeze po once daily, providing 250 kcals and 9 grams of protein  RD to continue to follow nutrition care plan   NUTRITION DIAGNOSIS: Inadequate oral intake related to decreased appetite as evidenced by pt report of eating 50% of clear liquids.   Goal: Meet >/= 90% of estimated nutrition needs   Monitor:  Diet advancement, PO intake, supplement acceptance, weight trend, labs   Reason for Assessment: Positive Malnutrition Screening Tool Score   63 y.o. male  Admitting Dx: Acute Resp Failure  ASSESSMENT: 63 year old male, residing at S/p recent CVA w/ right sided motor deficits. Began to c/o CP and worsening dyspnea first noted on 4/27 during PT. Sent to ER 4/28 for evaluation. 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   PMHx significant for DM, HTN, stroke, obesity.   Pt and pt's wife provided dietary history. Pt's wife reports that pt had the stroke on March 20th, 2015, and since then has lost 30 lbs. EPIC chart documents a loss of 22 lbs (8.7%), which is severe for time frame.   Pt reports good PO intake, and states he has been in rehab until April 17th eating food they provided. Reports to have been eating 100% of meals. Pt reports poor PO intake since last Thursday (4/23)  due to decreased appetite. Currently eating 50% of clear liquid diet. Because intake is minimal, recommending supplementation to prevent further weight loss.   Nutrition Focused Physical Exam does not show any signs of depletion of muscle mass or body fat.    Elevated sodium, glucose, BUN   Height: Ht Readings from Last 1 Encounters:  03/14/14 5\' 8"  (1.727 m)    Weight: Wt Readings from Last 1 Encounters:  03/15/14 230 lb 6.1 oz (104.5 kg)    Ideal Body Weight: 70 kg   % Ideal Body Weight: 149%   Wt Readings from Last 10 Encounters:  03/15/14 230 lb 6.1 oz (104.5 kg)  03/01/14 233 lb 0.4 oz (105.7 kg)  02/08/14 252 lb 10.4 oz (114.6 kg)  01/18/14 258 lb (117.028 kg)  07/20/13 246 lb (111.585 kg)  05/15/12 232 lb 12.9 oz (105.6 kg)    Usual Body Weight: 252 lbs   % Usual Body Weight: 91%   BMI:  Body mass index is 35.04 kg/(m^2)., Obesity Class II  Estimated Nutritional Needs: Kcal: 2000 - 2200 Protein: 110 - 125 g  Fluid: >/= 2 L/day  Skin: Ecchymosis on abdomen   Diet Order: Clear Liquid  EDUCATION NEEDS: -Education not appropriate at this time   Intake/Output Summary (Last 24 hours) at 03/15/14 0858 Last data filed at 03/15/14 0800  Gross per 24 hour  Intake 3010.33 ml  Output    900 ml  Net 2110.33 ml    Last BM: PTA   Labs:   Recent Labs Lab 03/14/14 1130 03/15/14 0532  NA 138 149*  K 4.0 3.8  CL 101 111  CO2 15* 23  BUN 25* 25*  CREATININE 1.12 1.23  CALCIUM 9.6 9.1  GLUCOSE 252* 138*    CBG (last 3)  Recent Labs  03/15/14 0039 03/15/14 0340 03/15/14 0732  GLUCAP 169* 128* 123*    Scheduled Meds: . alteplase (tPA/ ACTIVASE) PE Lysis 12 mg/250 mL (BILATERAL)  12 mg Intravenous Once  . alteplase (tPA/ ACTIVASE) PE Lysis 12 mg/250 mL (BILATERAL)  12 mg Intravenous Once  . antiseptic oral rinse  15 mL Mouth Rinse BID  . insulin aspart  0-15 Units Subcutaneous 6 times per day  . pantoprazole  40 mg Oral QHS  . sertraline  300 mg Oral Daily  . sodium chloride  3 mL Intravenous Q12H    Continuous Infusions: . sodium chloride 100 mL/hr at 03/14/14 1900  . sodium chloride 35 mL/hr at 03/15/14 0000  . sodium chloride 35 mL/hr at 03/15/14 0000  . sodium chloride    . sodium  chloride    . heparin 1,400 Units/hr (03/15/14 0800)    Past Medical History  Diagnosis Date  . Diabetes mellitus   . Hypertension   . Stroke July 2013    left paramedian pontine, incidental right parietal subcortical. both d/t small vessel disease  . Stroke March 2015    left basal ganglia secondary to small vessel disease  . Obesity   . Depression with anxiety   . OSA on CPAP     Past Surgical History  Procedure Laterality Date  . Tonsillectomy    . Anal fissure repair      Eppie Gibsonebekah L Matznick, BS Dietetic Intern Pager: [phone]  I agree with student dietitian note; appropriate revisions have been made.  Joaquin CourtsKimberly Lashaye Fisk, RD, LDN, CNSC Pager# [phone] After Hours Pager# [phone]

## 2014-03-15 NOTE — Progress Notes (Addendum)
ANTICOAGULATION CONSULT NOTE - Follow Up Consult  Pharmacy Consult for Heparin Indication: pulmonary embolus  Allergies  Allergen Reactions  . Penicillins Hives    Patient Measurements: Height: 5\' 8"  (172.7 cm) Weight: 230 lb 6.1 oz (104.5 kg) IBW/kg (Calculated) : 68.4 Heparin Dosing Weight: 92kg Vital Signs: Temp: 97.4 F (36.3 C) (04/29 1220) Temp src: Oral (04/29 1220) BP: 133/79 mmHg (04/29 1200) Pulse Rate: 76 (04/29 1200) Labs:  Recent Labs  03/14/14 1130  03/14/14 2332 03/15/14 0532 03/15/14 1130  HGB 16.7  --  14.6 14.7 13.2  HCT 47.5  --  42.1 43.4 37.8*  PLT 139*  --  124* 117* 99*  APTT 35  --   --   --   --   LABPROT 14.9  --   --   --   --   INR 1.20  --   --   --   --   HEPARINUNFRC  --   < > 0.26* 0.34 0.29*  CREATININE 1.12  --   --  1.23  --   < > = values in this interval not displayed.  Estimated Creatinine Clearance: 72.9 ml/min (by C-G formula based on Cr of 1.23).  Assessment: [age] YOM s/p catheter directed lysis of massive saddle pulmonary embolus with tPA x12 hours. Sheath has been removed with orders to restart IV heparin at 14:45PM. Small, grade 1 hematoma at time of sheath pull has now resolved per RN. Heparin was therapeutic prior at rate of 1400 units/hr. H/H slightly decreased. Platelets remain stable. Discussed possible change to Lovenox for more stable rates of anticoagulation with Dr. Youlanda RoysByrum--will hold off for now and monitor clinical status.   Goal of Therapy:  Heparin level 0.3 to 0.5 due to recent stroke (March 2015) Monitor platelets by anticoagulation protocol: Yes   Plan:  1. Restart heparin at 1400 units/hr at 14:45 PM per MD orders.  2. Recheck heparin level 6 hours post-restart.  3. Daily heparin level and CBC while on therapy.  4. Monitor for signs and symptoms of bleeding.   *MD-consider change to Lovenox and/or oral anticoagulation once deem appropriate.   Link SnufferJessica Millen, PharmD, BCPS Clinical  Pharmacist [phone] 03/15/2014,2:15 PM    Addendum: Initial heparin level after restart is subtherapeutic at 0.23. Per RN, no problems with infusion, previous hematoma resolved/stable and scant blood around peripheral IV site. Will increase heparin rate and check 6 hour follow-up level. - H/H stable, Plts trending down  Goal of Therapy:  Heparin level 0.3 to 0.5 due to recent stroke (March 2015) Monitor platelets by anticoagulation protocol: Yes  Plan: 1. Increase heparin drip to 1550 units/hr (15.5 ml/hr) 2. Check follow-up heparin level 6 hours after rate increase 3. Monitor for s/sx of bleeding  Wilfred LacyWesley Adama Ivins, PharmD Clinical Pharmacist [phone] 03/15/2014, 9:56 PM

## 2014-03-16 LAB — BASIC METABOLIC PANEL
BUN: 18 mg/dL (ref 6–23)
CHLORIDE: 106 meq/L (ref 96–112)
CO2: 20 meq/L (ref 19–32)
CREATININE: 0.97 mg/dL (ref 0.50–1.35)
Calcium: 8.3 mg/dL — ABNORMAL LOW (ref 8.4–10.5)
GFR calc Af Amer: >90 mL/min (ref >90)
GFR calc non Af Amer: 87 mL/min — ABNORMAL LOW (ref >90)
GLUCOSE: 104 mg/dL — AB (ref 70–99)
Potassium: 3.2 mEq/L — ABNORMAL LOW (ref 3.7–5.3)
Sodium: 141 mEq/L (ref 137–147)

## 2014-03-16 LAB — GLUCOSE, CAPILLARY
GLUCOSE-CAPILLARY: 131 mg/dL — AB (ref 70–99)
Glucose-Capillary: 107 mg/dL — ABNORMAL HIGH (ref 70–99)
Glucose-Capillary: 116 mg/dL — ABNORMAL HIGH (ref 70–99)
Glucose-Capillary: 120 mg/dL — ABNORMAL HIGH (ref 70–99)

## 2014-03-16 LAB — CBC
HEMATOCRIT: 35.7 % — AB (ref 39.0–52.0)
HEMOGLOBIN: 12.5 g/dL — AB (ref 13.0–17.0)
MCH: 30.6 pg (ref 26.0–34.0)
MCHC: 35 g/dL (ref 30.0–36.0)
MCV: 87.3 fL (ref 78.0–100.0)
Platelets: 103 10*3/uL — ABNORMAL LOW (ref 150–400)
RBC: 4.09 MIL/uL — AB (ref 4.22–5.81)
RDW: 12.7 % (ref 11.5–15.5)
WBC: 8.9 10*3/uL (ref 4.0–10.5)

## 2014-03-16 LAB — HEPARIN LEVEL (UNFRACTIONATED)
HEPARIN UNFRACTIONATED: 0.35 [IU]/mL (ref 0.30–0.70)
Heparin Unfractionated: 0.41 IU/mL (ref 0.30–0.70)

## 2014-03-16 LAB — ANTITHROMBIN III: AntiThromb III Func: 80 % (ref 75–120)

## 2014-03-16 MED ORDER — HYDRALAZINE HCL 20 MG/ML IJ SOLN
10.0000 mg | INTRAMUSCULAR | Status: DC | PRN
  Administered 2014-03-17 (×4): 10 mg via INTRAVENOUS
  Administered 2014-03-18: 20 mg via INTRAVENOUS
  Administered 2014-03-19 – 2014-03-20 (×3): 10 mg via INTRAVENOUS
  Filled 2014-03-16 (×6): qty 1

## 2014-03-16 MED ORDER — WARFARIN SODIUM 7.5 MG PO TABS
7.5000 mg | ORAL_TABLET | Freq: Once | ORAL | Status: DC
  Filled 2014-03-16: qty 1

## 2014-03-16 MED ORDER — POTASSIUM CHLORIDE CRYS ER 20 MEQ PO TBCR
30.0000 meq | EXTENDED_RELEASE_TABLET | ORAL | Status: AC
  Administered 2014-03-16 (×2): 30 meq via ORAL
  Filled 2014-03-16 (×4): qty 1

## 2014-03-16 MED ORDER — SIMVASTATIN 20 MG PO TABS
20.0000 mg | ORAL_TABLET | Freq: Every day | ORAL | Status: DC
  Administered 2014-03-16 – 2014-03-21 (×6): 20 mg via ORAL
  Filled 2014-03-16 (×7): qty 1

## 2014-03-16 MED ORDER — ARIPIPRAZOLE 2 MG PO TABS
2.0000 mg | ORAL_TABLET | Freq: Every day | ORAL | Status: DC
  Administered 2014-03-16 – 2014-03-22 (×7): 2 mg via ORAL
  Filled 2014-03-16 (×8): qty 1

## 2014-03-16 MED ORDER — ENSURE COMPLETE PO LIQD
237.0000 mL | Freq: Two times a day (BID) | ORAL | Status: DC
  Administered 2014-03-16 – 2014-03-22 (×8): 237 mL via ORAL

## 2014-03-16 MED ORDER — HYDRALAZINE HCL 20 MG/ML IJ SOLN
10.0000 mg | Freq: Once | INTRAMUSCULAR | Status: AC
  Administered 2014-03-16: 10 mg via INTRAVENOUS

## 2014-03-16 MED ORDER — AMLODIPINE BESYLATE 10 MG PO TABS
10.0000 mg | ORAL_TABLET | Freq: Every day | ORAL | Status: DC
  Administered 2014-03-16 – 2014-03-22 (×7): 10 mg via ORAL
  Filled 2014-03-16 (×7): qty 1

## 2014-03-16 MED ORDER — WARFARIN - PHARMACIST DOSING INPATIENT: Freq: Every day | Status: DC

## 2014-03-16 MED ORDER — HYDRALAZINE HCL 20 MG/ML IJ SOLN
INTRAMUSCULAR | Status: AC
  Filled 2014-03-16: qty 1

## 2014-03-16 NOTE — Progress Notes (Signed)
Subjective: B saddle PE lysis performed in IR 4/29 Pt has done well Breathing so much better Can speak in sentences without being SOB No pain   Objective: Vital signs in last 24 hours: Temp:  [97.4 F (36.3 C)-99 F (37.2 C)] 98.3 F (36.8 C) (04/30 0817) Pulse Rate:  [64-107] 72 (04/30 0700) Resp:  [22-31] 24 (04/30 0700) BP: (124-172)/(61-84) 172/80 mmHg (04/30 0700) SpO2:  [96 %-100 %] 98 % (04/30 0700) Weight:  [106.8 kg (235 lb 7.2 oz)] 106.8 kg (235 lb 7.2 oz) (04/30 0400)    Intake/Output from previous day: 04/29 0701 - 04/30 0700 In: 3318.4 [P.O.:240; I.V.:3078.4] Out: 2275 [Urine:2275] Intake/Output this shift: Total I/O In: -  Out: 300 [Urine:300]  PE:  Afeb; BP: 140s-150s/70s-80s Restful Lying in bed Rt groin soft; no hematoma Rt foot 2+ pulses Lungs CTA  Lab Results:   Recent Labs  03/15/14 2101 03/16/14 0332  WBC 10.2 8.9  HGB 13.2 12.5*  HCT 37.5* 35.7*  PLT 93* 103*   BMET  Recent Labs  03/15/14 0532 03/16/14 0332  NA 149* 141  K 3.8 3.2*  CL 111 106  CO2 23 20  GLUCOSE 138* 104*  BUN 25* 18  CREATININE 1.23 0.97  CALCIUM 9.1 8.3*   PT/INR  Recent Labs  03/14/14 1130  LABPROT 14.9  INR 1.20   ABG  Recent Labs  03/14/14 1323  PHART 7.416  HCO3 16.8*    Studies/Results: Dg Chest 2 View  03/14/2014   CLINICAL DATA:  Short of breath  EXAM: CHEST  2 VIEW  COMPARISON:  DG CHEST 1V PORT dated 02/03/2014; DG CHEST 2 VIEW dated 08/09/2007; DG CHEST 2 VIEW dated 05/15/2012  FINDINGS: Stable enlarged cardiac silhouette. There are low lung volumes. No effusion, infiltrate, or pneumothorax. There is a compression fracture at L1 which appears chronic but is new from 05/15/2012 with approximately 25% loss of vertebral body height.  IMPRESSION: 1. Cardiomegaly and low lung volumes. No acute cardiopulmonary findings. 2. Compression fractures at L1 appears chronic but is new from 05/15/2012.   Electronically Signed   By: Genevive Bi  M.D.   On: 03/14/2014 11:59   Ct Head Wo Contrast  03/14/2014   CLINICAL DATA:  Right-sided weakness. Recent left lenticulostriate infarct. Assess for hemorrhagic transformation prior to thrombolysis of pulmonary emboli.  EXAM: CT HEAD WITHOUT CONTRAST  TECHNIQUE: Contiguous axial images were obtained from the base of the skull through the vertex without contrast.  COMPARISON:  MR HEAD W/O CM dated 02/04/2014; CT ANGIO HEAD W/CM &/OR WO/CM dated 02/03/2014; CT ANGIO CHEST W/CM &/OR WO/CM dated 03/14/2014  FINDINGS: No evidence for acute cortical or subcortical infarction. No mass lesion or hydrocephalus. No extra-axial fluid.  Extensive white matter hypoattenuation representing small vessel disease is stable.  The roughly 2 cm region of previously acute infarction affecting the lateral putamen and periventricular white matter, demonstrated on MR from 02/04/2014, is now isodense with the chronic microvascular ischemic change noted elsewhere, and displays a typical subacute appearance. No evidence for acute hemorrhage. No subarachnoid or intraventricular blood. Moderate dolichoectasia of the intracranial vasculature without CT signs of proximal vascular thrombosis.  Calvarium intact. No acute sinus or mastoid disease. Negative orbits.  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 No evidence for hemorrhagic transformation. No abnormalities to suggest interval acute cerebral ischemia.   Electronically Signed   By: Davonna Belling M.D.   On:  03/14/2014 20:40   Ct Angio Chest Pe W/cm &/or Wo Cm  03/14/2014   ADDENDUM REPORT: 03/14/2014 21:47  ADDENDUM: The RV/LV ratio is 3.15.  Signed,  Sterling Big, MD  [facility]  [facility]   Electronically Signed   By: Malachy Moan M.D.   On: 03/14/2014 21:47   03/14/2014   CLINICAL DATA:  Shortness of  Breath, recent stroke.  Chest pain.  EXAM: CT ANGIOGRAPHY CHEST WITH CONTRAST  TECHNIQUE: Multidetector CT imaging of the chest was performed using the standard protocol during bolus administration of intravenous contrast. Multiplanar CT image reconstructions and MIPs were obtained to evaluate the vascular anatomy.  CONTRAST:  80mL OMNIPAQUE IOHEXOL 350 MG/ML SOLN  COMPARISON:  None.  FINDINGS: There is a large saddle embolus with near occlusive extension into the right lower lobe and lingular branches of the pulmonary artery and partially occlusive clot into the left lower lobe branches. There is left ventricular hypertrophy. The right ventricular diameter is markedly wider than the left and there is reflux of contrast from the right atrium into the central aspect of hepatic veins consistent with right heart strain/failure. No pleural or pericardial effusion. No hilar or mediastinal adenopathy. There is a 9 mm wedge-shaped pleural-based opacity in the anterior right upper lobe. There is subsegmental atelectasis medially in the superior segment left lower lobe and there is minimal subsegmental atelectasis in both lung bases. No focal airspace consolidation. Visualized portions of upper abdomen unremarkable. Minimal spurring in the lower thoracic spine.  Review of the MIP images confirms the above findings.  IMPRESSION: 1. Massive bilateral pulmonary emboli with saddle embolus and evidence of right heart strain/failure. Critical Value/emergent results were called by telephone at the time of interpretation on 03/14/2014 at 2:09 PM to Dr. Oletta Lamas, who verbally acknowledged these results.  Electronically Signed: By: Oley Balm M.D. On: 03/14/2014 14:11   Ir Angiogram Pulmonary Right Selective  03/15/2014   CLINICAL DATA:  Acute symptomatic large volume side hole embolus, bilateral pulmonary emboli, tachycardia, labored breathing  EXAM: ULTRASOUND GUIDANCE FOR VASCULAR ACCESS  BILATERAL PULMONARY ARTERY  CATHETERIZATIONS  PULMONARY ANGIOGRAM  INITIATION OF TRANSCATHETER PULMONARY EMBOLUS THROMBOLYSIS  Date:  4/28/20154/28/2015 11:51 PM  Radiologist:  Judie Petit. Ruel Favors, MD  Guidance:  Ultrasound and fluoroscopic  FLUOROSCOPY TIME:  16 min 6 seconds  MEDICATIONS AND MEDICAL HISTORY: 2 mg Versed, 50 mcg fentanyl  ANESTHESIA/SEDATION: 30 min  CONTRAST:  10mL OMNIPAQUE IOHEXOL 300 MG/ML  SOLN  COMPLICATIONS: No complications  PROCEDURE: Informed consent was obtained from the patient following explanation of the procedure, risks, benefits and alternatives. The patient understands, agrees and consents for the procedure. All questions were addressed. A time out was performed.  Maximal barrier sterile technique utilized including caps, mask, sterile gowns, sterile gloves, large sterile drape, hand hygiene, and chlor prep.  Under sterile conditions and local anesthesia, ultrasound micropuncture access was performed of the right common femoral vein. This was performed twice into the right common femoral vein for 2 tandem 7 French sheaths. Over a Bentson guidewires, catheter access was manipulated through the right heart into the lower lobe pulmonary arteries bilaterally. For right pulmonary artery access initially a 7 French AP 2 pigtail catheter was directed into the right pulmonary artery. This was exchanged for a 100 cm Berenstein catheter over a Glidewire. Access was advanced more peripherally into the right pulmonary artery extending into the lower lobe.  Contrast injection performed for a limited pulmonary angiogram. This demonstrated occlusive  diffuse right hilar pulmonary embolus extending into the upper and lower lobes as well as saddle thrombus centrally. Mean right pulmonary artery pressure measurement was 47. A Rosen guidewire was advanced through the catheter into the right lower lobe pulmonary artery.  In a similar fashion, the Bernstein catheter was utilized to manipulate access into the left lower lobe pulmonary  artery. A second Rosen guidewire was advanced into the left lower lobe pulmonary artery peripherally.  Over both Rosen guidewires, the EKOS infusion catheters were advanced into the lower lobe pulmonary arteries. Left pulmonary artery infusion catheter has an infusion length of 12 cm and the right pulmonary artery catheter has an infusion length of 18 cm.  Images obtained for documentation.  Catheters were secured at the right common femoral venous access sites.  TPA infusion will be initiated at a rate of 1mg /hr through each catheter for 12 hr (total infusion 24 mg).  Follow-up PA pressure measurements will be performed at 12 hrs.  IMPRESSION: Extensive acute saddle embolus/bilateral pulmonary emboli, with evidence of right heart strain by CTA and echo  Initial mean right pulmonary artery pressure 47.  Successful insertion of bilateral pulmonary artery EKOS infusion catheters to performed PE thrombolysis, as above.   Electronically Signed   By: Ruel Favors M.D.   On: 03/15/2014 09:47   Ir US Guide Vasc Access Right  03/15/2014   CLINICAL DATA:  Acute symptomatic large volume side hole embolus, bilateral pulmonary emboli, tachycardia, labored breathing  EXAM: ULTRASOUND GUIDANCE FOR VASCULAR ACCESS  BILATERAL PULMONARY ARTERY CATHETERIZATIONS  PULMONARY ANGIOGRAM  INITIATION OF TRANSCATHETER PULMONARY EMBOLUS THROMBOLYSIS  Date:  4/28/20154/28/2015 11:51 PM  Radiologist:  Judie Petit. Ruel Favors, MD  Guidance:  Ultrasound and fluoroscopic  FLUOROSCOPY TIME:  16 min 6 seconds  MEDICATIONS AND MEDICAL HISTORY: 2 mg Versed, 50 mcg fentanyl  ANESTHESIA/SEDATION: 30 min  CONTRAST:  10mL OMNIPAQUE IOHEXOL 300 MG/ML  SOLN  COMPLICATIONS: No complications  PROCEDURE: Informed consent was obtained from the patient following explanation of the procedure, risks, benefits and alternatives. The patient understands, agrees and consents for the procedure. All questions were addressed. A time out was performed.  Maximal barrier  sterile technique utilized including caps, mask, sterile gowns, sterile gloves, large sterile drape, hand hygiene, and chlor prep.  Under sterile conditions and local anesthesia, ultrasound micropuncture access was performed of the right common femoral vein. This was performed twice into the right common femoral vein for 2 tandem 7 French sheaths. Over a Bentson guidewires, catheter access was manipulated through the right heart into the lower lobe pulmonary arteries bilaterally. For right pulmonary artery access initially a 7 French AP 2 pigtail catheter was directed into the right pulmonary artery. This was exchanged for a 100 cm Berenstein catheter over a Glidewire. Access was advanced more peripherally into the right pulmonary artery extending into the lower lobe.  Contrast injection performed for a limited pulmonary angiogram. This demonstrated occlusive diffuse right hilar pulmonary embolus extending into the upper and lower lobes as well as saddle thrombus centrally. Mean right pulmonary artery pressure measurement was 47. A Rosen guidewire was advanced through the catheter into the right lower lobe pulmonary artery.  In a similar fashion, the Bernstein catheter was utilized to manipulate access into the left lower lobe pulmonary artery. A second Rosen guidewire was advanced into the left lower lobe pulmonary artery peripherally.  Over both Rosen guidewires, the EKOS infusion catheters were advanced into the lower lobe pulmonary arteries. Left pulmonary artery infusion catheter has an  infusion length of 12 cm and the right pulmonary artery catheter has an infusion length of 18 cm.  Images obtained for documentation.  Catheters were secured at the right common femoral venous access sites.  TPA infusion will be initiated at a rate of 1mg /hr through each catheter for 12 hr (total infusion 24 mg).  Follow-up PA pressure measurements will be performed at 12 hrs.  IMPRESSION: Extensive acute saddle  embolus/bilateral pulmonary emboli, with evidence of right heart strain by CTA and echo  Initial mean right pulmonary artery pressure 47.  Successful insertion of bilateral pulmonary artery EKOS infusion catheters to performed PE thrombolysis, as above.   Electronically Signed   By: Ruel Favorsrevor  Shick M.D.   On: 03/15/2014 09:47   Ir Infusion Thrombol Arterial Initial (ms)  03/15/2014   CLINICAL DATA:  Acute symptomatic large volume side hole embolus, bilateral pulmonary emboli, tachycardia, labored breathing  EXAM: ULTRASOUND GUIDANCE FOR VASCULAR ACCESS  BILATERAL PULMONARY ARTERY CATHETERIZATIONS  PULMONARY ANGIOGRAM  INITIATION OF TRANSCATHETER PULMONARY EMBOLUS THROMBOLYSIS  Date:  4/28/20154/28/2015 11:51 PM  Radiologist:  Judie PetitM. Ruel Favorsrevor Shick, MD  Guidance:  Ultrasound and fluoroscopic  FLUOROSCOPY TIME:  16 min 6 seconds  MEDICATIONS AND MEDICAL HISTORY: 2 mg Versed, 50 mcg fentanyl  ANESTHESIA/SEDATION: 30 min  CONTRAST:  10mL OMNIPAQUE IOHEXOL 300 MG/ML  SOLN  COMPLICATIONS: No complications  PROCEDURE: Informed consent was obtained from the patient following explanation of the procedure, risks, benefits and alternatives. The patient understands, agrees and consents for the procedure. All questions were addressed. A time out was performed.  Maximal barrier sterile technique utilized including caps, mask, sterile gowns, sterile gloves, large sterile drape, hand hygiene, and chlor prep.  Under sterile conditions and local anesthesia, ultrasound micropuncture access was performed of the right common femoral vein. This was performed twice into the right common femoral vein for 2 tandem 7 French sheaths. Over a Bentson guidewires, catheter access was manipulated through the right heart into the lower lobe pulmonary arteries bilaterally. For right pulmonary artery access initially a 7 French AP 2 pigtail catheter was directed into the right pulmonary artery. This was exchanged for a 100 cm Berenstein catheter over a  Glidewire. Access was advanced more peripherally into the right pulmonary artery extending into the lower lobe.  Contrast injection performed for a limited pulmonary angiogram. This demonstrated occlusive diffuse right hilar pulmonary embolus extending into the upper and lower lobes as well as saddle thrombus centrally. Mean right pulmonary artery pressure measurement was 47. A Rosen guidewire was advanced through the catheter into the right lower lobe pulmonary artery.  In a similar fashion, the Bernstein catheter was utilized to manipulate access into the left lower lobe pulmonary artery. A second Rosen guidewire was advanced into the left lower lobe pulmonary artery peripherally.  Over both Rosen guidewires, the EKOS infusion catheters were advanced into the lower lobe pulmonary arteries. Left pulmonary artery infusion catheter has an infusion length of 12 cm and the right pulmonary artery catheter has an infusion length of 18 cm.  Images obtained for documentation.  Catheters were secured at the right common femoral venous access sites.  TPA infusion will be initiated at a rate of 1mg /hr through each catheter for 12 hr (total infusion 24 mg).  Follow-up PA pressure measurements will be performed at 12 hrs.  IMPRESSION: Extensive acute saddle embolus/bilateral pulmonary emboli, with evidence of right heart strain by CTA and echo  Initial mean right pulmonary artery pressure 47.  Successful insertion of bilateral pulmonary  artery EKOS infusion catheters to performed PE thrombolysis, as above.   Electronically Signed   By: Ruel Favorsrevor  Shick M.D.   On: 03/15/2014 09:47   Ir Infusion Thrombol Arterial Initial (ms)  03/15/2014   CLINICAL DATA:  Acute symptomatic large volume side hole embolus, bilateral pulmonary emboli, tachycardia, labored breathing  EXAM: ULTRASOUND GUIDANCE FOR VASCULAR ACCESS  BILATERAL PULMONARY ARTERY CATHETERIZATIONS  PULMONARY ANGIOGRAM  INITIATION OF TRANSCATHETER PULMONARY EMBOLUS  THROMBOLYSIS  Date:  4/28/20154/28/2015 11:51 PM  Radiologist:  Judie PetitM. Ruel Favorsrevor Shick, MD  Guidance:  Ultrasound and fluoroscopic  FLUOROSCOPY TIME:  16 min 6 seconds  MEDICATIONS AND MEDICAL HISTORY: 2 mg Versed, 50 mcg fentanyl  ANESTHESIA/SEDATION: 30 min  CONTRAST:  10mL OMNIPAQUE IOHEXOL 300 MG/ML  SOLN  COMPLICATIONS: No complications  PROCEDURE: Informed consent was obtained from the patient following explanation of the procedure, risks, benefits and alternatives. The patient understands, agrees and consents for the procedure. All questions were addressed. A time out was performed.  Maximal barrier sterile technique utilized including caps, mask, sterile gowns, sterile gloves, large sterile drape, hand hygiene, and chlor prep.  Under sterile conditions and local anesthesia, ultrasound micropuncture access was performed of the right common femoral vein. This was performed twice into the right common femoral vein for 2 tandem 7 French sheaths. Over a Bentson guidewires, catheter access was manipulated through the right heart into the lower lobe pulmonary arteries bilaterally. For right pulmonary artery access initially a 7 French AP 2 pigtail catheter was directed into the right pulmonary artery. This was exchanged for a 100 cm Berenstein catheter over a Glidewire. Access was advanced more peripherally into the right pulmonary artery extending into the lower lobe.  Contrast injection performed for a limited pulmonary angiogram. This demonstrated occlusive diffuse right hilar pulmonary embolus extending into the upper and lower lobes as well as saddle thrombus centrally. Mean right pulmonary artery pressure measurement was 47. A Rosen guidewire was advanced through the catheter into the right lower lobe pulmonary artery.  In a similar fashion, the Bernstein catheter was utilized to manipulate access into the left lower lobe pulmonary artery. A second Rosen guidewire was advanced into the left lower lobe pulmonary  artery peripherally.  Over both Rosen guidewires, the EKOS infusion catheters were advanced into the lower lobe pulmonary arteries. Left pulmonary artery infusion catheter has an infusion length of 12 cm and the right pulmonary artery catheter has an infusion length of 18 cm.  Images obtained for documentation.  Catheters were secured at the right common femoral venous access sites.  TPA infusion will be initiated at a rate of 1mg /hr through each catheter for 12 hr (total infusion 24 mg).  Follow-up PA pressure measurements will be performed at 12 hrs.  IMPRESSION: Extensive acute saddle embolus/bilateral pulmonary emboli, with evidence of right heart strain by CTA and echo  Initial mean right pulmonary artery pressure 47.  Successful insertion of bilateral pulmonary artery EKOS infusion catheters to performed PE thrombolysis, as above.   Electronically Signed   By: Ruel Favorsrevor  Shick M.D.   On: 03/15/2014 09:47   Ir Rande Lawmanhromb F/u Eval Art/ven Final Day (ms)  03/15/2014   CLINICAL DATA:  Pulmonary thromboembolism.  Post 12 hr of lysis.  EXAM: IR THROMB F/U EVAL ART/VEN FINAL DAY  FLUOROSCOPY TIME:  12 seconds.  MEDICATIONS AND MEDICAL HISTORY: None  ANESTHESIA/SEDATION: None  CONTRAST:  None  PROCEDURE: The procedure, risks, benefits, and alternatives were explained to the patient. Questions regarding the procedure were encouraged and  answered. The patient understands and consents to the procedure.  Fluoroscopic imaging was performed to confirm stable position of the right and left pulmonary artery catheters. Pulmonary artery pressures within measured. Pulmonary artery pressures were measured as follows:  Right pulmonary artery pressure:  45/20 with a mean of 28 mm Hg.  Left pulmonary artery pressure:  51/20 with a mean of 30 mm Hg.  The sheaths were removed and hemostasis was achieved with direct pressure and a VPAD device.  FINDINGS: Fluoroscopic imaging confirms stable position of the catheters.  COMPLICATIONS: None   IMPRESSION: There is been a dramatic improvement in the pulmonary artery pressures compared with pre slices measurements. The mean right pulmonary artery pressure has dropped from 47 to 28 mm Hg.   Electronically Signed   By: Maryclare Bean M.D.   On: 03/15/2014 16:57    Anti-infectives: Anti-infectives   None      Assessment/Plan: s/p * No surgery found *  PE thrombolysis performed 4/29 Doing well Call us if needed CTA PE study ordered for 5/1   LOS: 2 days    Robet Leu 03/16/2014

## 2014-03-16 NOTE — Progress Notes (Signed)
Dr. Informed about right groin bleed. Pressure applied at this time.

## 2014-03-16 NOTE — Progress Notes (Signed)
[id number]

## 2014-03-16 NOTE — Progress Notes (Signed)
[facility]  The patient does} apply [facility] Protocol based on the criteria listed below:   1. Is GFR >/= 40 ml/min? yes  Patient's GFR today is >90 2. Is urine output >/= 0.5 ml/kg/hr for the last 6 hours? yes Patient's UOP is 0.97 ml/kg/hr 3. Is BUN < 60 mg/dL? yes  Patient's BUN today is 18 4. Abnormal electrolyte(s):K=3.2 5. Ordered repletion with: see order  6. If a panic level lab has been reported, has the CCM MD in charge been notified? yes.   Physician:  Dr. Juanna CaoMcquaid  Dakota Hall 03/16/2014 6:12 AM

## 2014-03-16 NOTE — Progress Notes (Signed)
Notified Dr. Lowella DandyHenn in IR about right groin bleed. No new orders at this time

## 2014-03-16 NOTE — Progress Notes (Signed)
Received to room 5w13, pt instructed on room surroundings, and call light, denies pain/nausea at this time.

## 2014-03-16 NOTE — Progress Notes (Addendum)
PULMONARY / CRITICAL CARE MEDICINE   Name: Dakota Hall MRN: [id number] DOB: June 01, 1951    ADMISSION DATE:  03/14/2014 CONSULTATION DATE:  03/14/2014  REFERRING MD : EDP  PRIMARY SERVICE: Pulmonary   CHIEF COMPLAINT: Acute resp failure   BRIEF PATIENT DESCRIPTION:  63 year old male, residing at S/p recent CVA w/ right sided motor deficits. Began to c/o CP and worsening dyspnea firt noted on 4/27 during PT. O2 sats recorded in 70% range on room air. Sent to ER 4/28 for evaluation. Dx eval showed massive saddle embolus w/ evidence or right heart strain. PCCM asked to admit.   SIGNIFICANT EVENTS / STUDIES:  CT chest 4/28: Massive bilateral pulmonary emboli with saddle embolus and evidence of right heart strain/failure.  ECHO 4/28 >>> EF 55-60%, RV hemodynamic compromise from probable large saddle pulmonary embolus.  There is thrombus in transit in the RA.   LE Doppler US 4/28 >>> acute DVT throughout popliteal vein; age indeterminate DVT throughout posterior tibial vein; acute SVT involving proximal segment of lesser saphenous vein. CT chest 04/30 >>>  LINES / TUBES:  04/28 B/L PA infusion catheters  CULTURES:  Blood cultures x 2  04/28>>>  ANTIBIOTICS: None   INTERVAL HISTORY:  PA catheters removed yesterday.  Patient hemodynamically stable.  SpO2 upper 90s on 2L via [place].  He denies dyspnea or chest pain.  VITAL SIGNS: Temp:  [97.4 F (36.3 C)-99 F (37.2 C)] 98.2 F (36.8 C) (04/30 0408) Pulse Rate:  [66-107] 67 (04/30 0500) Resp:  [22-31] 25 (04/30 0500) BP: (123-156)/(61-84) 144/70 mmHg (04/30 0500) SpO2:  [96 %-100 %] 98 % (04/30 0500) Weight:  [106.8 kg (235 lb 7.2 oz)] 106.8 kg (235 lb 7.2 oz) (04/30 0400)  HEMODYNAMICS:   VENTILATOR SETTINGS:  N/A  INTAKE / OUTPUT: Intake/Output     04/29 0701 - 04/30 0700   I.V. (mL/kg) 2847.4 (26.7)   Total Intake(mL/kg) 2847.4 (26.7)   Urine (mL/kg/hr) 1975 (0.8)   Total Output 1975   Net +872.4        PHYSICAL  EXAMINATION: General:  In NAD Neuro:  AAO x 3, responding appropriately, CN2-12 grossly intact HEENT:  WNL Cardiovascular:  RRR Lungs:  Decreased BS at the bases anteriorly, otherwise clear Abdomen:  + BS, soft, NT, ND Musculoskeletal:  Residual right sided weakness, RUE > RLE Skin:  Intact   LABS:  CBC  Recent Labs Lab 03/15/14 1130 03/15/14 2101 03/16/14 0332  WBC 11.8* 10.2 8.9  HGB 13.2 13.2 12.5*  HCT 37.8* 37.5* 35.7*  PLT 99* 93* 103*   Coag's  Recent Labs Lab 03/14/14 1130  APTT 35  INR 1.20   BMET  Recent Labs Lab 03/14/14 1130 03/15/14 0532 03/16/14 0332  NA 138 149* 141  K 4.0 3.8 3.2*  CL 101 111 106  CO2 15* 23 20  BUN 25* 25* 18  CREATININE 1.12 1.23 0.97  GLUCOSE 252* 138* 104*   Electrolytes  Recent Labs Lab 03/14/14 1130 03/15/14 0532 03/16/14 0332  CALCIUM 9.6 9.1 8.3*   Sepsis Markers  Recent Labs Lab 03/14/14 1443  LATICACIDVEN 1.72   ABG  Recent Labs Lab 03/14/14 1323  PHART 7.416  PCO2ART 26.1*  PO2ART 62.0*   Cardiac Enzymes  Recent Labs Lab 03/14/14 1130  PROBNP 5474.0*   Glucose  Recent Labs Lab 03/14/14 1923 03/15/14 0039 03/15/14 0340 03/15/14 0732 03/15/14 1131 03/15/14 1510  GLUCAP 159* 169* 128* 123* 109* 103*    Imaging Dg Chest 2 View  03/14/2014   CLINICAL DATA:  Short of breath  EXAM: CHEST  2 VIEW  COMPARISON:  DG CHEST 1V PORT dated 02/03/2014; DG CHEST 2 VIEW dated 08/09/2007; DG CHEST 2 VIEW dated 05/15/2012  FINDINGS: Stable enlarged cardiac silhouette. There are low lung volumes. No effusion, infiltrate, or pneumothorax. There is a compression fracture at L1 which appears chronic but is new from 05/15/2012 with approximately 25% loss of vertebral body height.  IMPRESSION: 1. Cardiomegaly and low lung volumes. No acute cardiopulmonary findings. 2. Compression fractures at L1 appears chronic but is new from 05/15/2012.   Electronically Signed   By: Genevive Bi M.D.   On: 03/14/2014  11:59   Ct Head Wo Contrast  03/14/2014   CLINICAL DATA:  Right-sided weakness. Recent left lenticulostriate infarct. Assess for hemorrhagic transformation prior to thrombolysis of pulmonary emboli.  EXAM: CT HEAD WITHOUT CONTRAST  TECHNIQUE: Contiguous axial images were obtained from the base of the skull through the vertex without contrast.  COMPARISON:  MR HEAD W/O CM dated 02/04/2014; CT ANGIO HEAD W/CM &/OR WO/CM dated 02/03/2014; CT ANGIO CHEST W/CM &/OR WO/CM dated 03/14/2014  FINDINGS: No evidence for acute cortical or subcortical infarction. No mass lesion or hydrocephalus. No extra-axial fluid.  Extensive white matter hypoattenuation representing small vessel disease is stable.  The roughly 2 cm region of previously acute infarction affecting the lateral putamen and periventricular white matter, demonstrated on MR from 02/04/2014, is now isodense with the chronic microvascular ischemic change noted elsewhere, and displays a typical subacute appearance. No evidence for acute hemorrhage. No subarachnoid or intraventricular blood. Moderate dolichoectasia of the intracranial vasculature without CT signs of proximal vascular thrombosis.  Calvarium intact. No acute sinus or mastoid disease. Negative orbits.  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 No evidence for hemorrhagic transformation. No abnormalities to suggest interval acute cerebral ischemia.   Electronically Signed   By: Davonna Belling M.D.   On: 03/14/2014 20:40   Ct Angio Chest Pe W/cm &/or Wo Cm  03/14/2014   ADDENDUM REPORT: 03/14/2014 21:47  ADDENDUM: The RV/LV ratio is 3.15.  Signed,  Sterling Big, MD  [facility]  [facility]   Electronically Signed   By: Malachy Moan M.D.   On: 03/14/2014 21:47   03/14/2014   CLINICAL DATA:  Shortness of Breath, recent stroke.   Chest pain.  EXAM: CT ANGIOGRAPHY CHEST WITH CONTRAST  TECHNIQUE: Multidetector CT imaging of the chest was performed using the standard protocol during bolus administration of intravenous contrast. Multiplanar CT image reconstructions and MIPs were obtained to evaluate the vascular anatomy.  CONTRAST:  80mL OMNIPAQUE IOHEXOL 350 MG/ML SOLN  COMPARISON:  None.  FINDINGS: There is a large saddle embolus with near occlusive extension into the right lower lobe and lingular branches of the pulmonary artery and partially occlusive clot into the left lower lobe branches. There is left ventricular hypertrophy. The right ventricular diameter is markedly wider than the left and there is reflux of contrast from the right atrium into the central aspect of hepatic veins consistent with right heart strain/failure. No pleural or pericardial effusion. No hilar or mediastinal adenopathy. There is a 9 mm wedge-shaped pleural-based opacity in the anterior right upper lobe. There is subsegmental atelectasis medially in the superior segment left lower lobe and there is minimal subsegmental atelectasis in both lung bases. No focal airspace consolidation. Visualized portions of upper abdomen  unremarkable. Minimal spurring in the lower thoracic spine.  Review of the MIP images confirms the above findings.  IMPRESSION: 1. Massive bilateral pulmonary emboli with saddle embolus and evidence of right heart strain/failure. Critical Value/emergent results were called by telephone at the time of interpretation on 03/14/2014 at 2:09 PM to Dr. Oletta Lamas, who verbally acknowledged these results.  Electronically Signed: By: Oley Balm M.D. On: 03/14/2014 14:11   Ir Angiogram Pulmonary Right Selective  03/15/2014   CLINICAL DATA:  Acute symptomatic large volume side hole embolus, bilateral pulmonary emboli, tachycardia, labored breathing  EXAM: ULTRASOUND GUIDANCE FOR VASCULAR ACCESS  BILATERAL PULMONARY ARTERY CATHETERIZATIONS  PULMONARY ANGIOGRAM   INITIATION OF TRANSCATHETER PULMONARY EMBOLUS THROMBOLYSIS  Date:  4/28/20154/28/2015 11:51 PM  Radiologist:  Judie Petit. Ruel Favors, MD  Guidance:  Ultrasound and fluoroscopic  FLUOROSCOPY TIME:  16 min 6 seconds  MEDICATIONS AND MEDICAL HISTORY: 2 mg Versed, 50 mcg fentanyl  ANESTHESIA/SEDATION: 30 min  CONTRAST:  10mL OMNIPAQUE IOHEXOL 300 MG/ML  SOLN  COMPLICATIONS: No complications  PROCEDURE: Informed consent was obtained from the patient following explanation of the procedure, risks, benefits and alternatives. The patient understands, agrees and consents for the procedure. All questions were addressed. A time out was performed.  Maximal barrier sterile technique utilized including caps, mask, sterile gowns, sterile gloves, large sterile drape, hand hygiene, and chlor prep.  Under sterile conditions and local anesthesia, ultrasound micropuncture access was performed of the right common femoral vein. This was performed twice into the right common femoral vein for 2 tandem 7 French sheaths. Over a Bentson guidewires, catheter access was manipulated through the right heart into the lower lobe pulmonary arteries bilaterally. For right pulmonary artery access initially a 7 French AP 2 pigtail catheter was directed into the right pulmonary artery. This was exchanged for a 100 cm Berenstein catheter over a Glidewire. Access was advanced more peripherally into the right pulmonary artery extending into the lower lobe.  Contrast injection performed for a limited pulmonary angiogram. This demonstrated occlusive diffuse right hilar pulmonary embolus extending into the upper and lower lobes as well as saddle thrombus centrally. Mean right pulmonary artery pressure measurement was 47. A Rosen guidewire was advanced through the catheter into the right lower lobe pulmonary artery.  In a similar fashion, the Bernstein catheter was utilized to manipulate access into the left lower lobe pulmonary artery. A second Rosen guidewire was  advanced into the left lower lobe pulmonary artery peripherally.  Over both Rosen guidewires, the EKOS infusion catheters were advanced into the lower lobe pulmonary arteries. Left pulmonary artery infusion catheter has an infusion length of 12 cm and the right pulmonary artery catheter has an infusion length of 18 cm.  Images obtained for documentation.  Catheters were secured at the right common femoral venous access sites.  TPA infusion will be initiated at a rate of 1mg /hr through each catheter for 12 hr (total infusion 24 mg).  Follow-up PA pressure measurements will be performed at 12 hrs.  IMPRESSION: Extensive acute saddle embolus/bilateral pulmonary emboli, with evidence of right heart strain by CTA and echo  Initial mean right pulmonary artery pressure 47.  Successful insertion of bilateral pulmonary artery EKOS infusion catheters to performed PE thrombolysis, as above.   Electronically Signed   By: Ruel Favors M.D.   On: 03/15/2014 09:47   Ir US Guide Vasc Access Right  03/15/2014   CLINICAL DATA:  Acute symptomatic large volume side hole embolus, bilateral pulmonary emboli, tachycardia, labored breathing  EXAM:  ULTRASOUND GUIDANCE FOR VASCULAR ACCESS  BILATERAL PULMONARY ARTERY CATHETERIZATIONS  PULMONARY ANGIOGRAM  INITIATION OF TRANSCATHETER PULMONARY EMBOLUS THROMBOLYSIS  Date:  4/28/20154/28/2015 11:51 PM  Radiologist:  Judie PetitM. Ruel Favorsrevor Shick, MD  Guidance:  Ultrasound and fluoroscopic  FLUOROSCOPY TIME:  16 min 6 seconds  MEDICATIONS AND MEDICAL HISTORY: 2 mg Versed, 50 mcg fentanyl  ANESTHESIA/SEDATION: 30 min  CONTRAST:  10mL OMNIPAQUE IOHEXOL 300 MG/ML  SOLN  COMPLICATIONS: No complications  PROCEDURE: Informed consent was obtained from the patient following explanation of the procedure, risks, benefits and alternatives. The patient understands, agrees and consents for the procedure. All questions were addressed. A time out was performed.  Maximal barrier sterile technique utilized including caps,  mask, sterile gowns, sterile gloves, large sterile drape, hand hygiene, and chlor prep.  Under sterile conditions and local anesthesia, ultrasound micropuncture access was performed of the right common femoral vein. This was performed twice into the right common femoral vein for 2 tandem 7 French sheaths. Over a Bentson guidewires, catheter access was manipulated through the right heart into the lower lobe pulmonary arteries bilaterally. For right pulmonary artery access initially a 7 French AP 2 pigtail catheter was directed into the right pulmonary artery. This was exchanged for a 100 cm Berenstein catheter over a Glidewire. Access was advanced more peripherally into the right pulmonary artery extending into the lower lobe.  Contrast injection performed for a limited pulmonary angiogram. This demonstrated occlusive diffuse right hilar pulmonary embolus extending into the upper and lower lobes as well as saddle thrombus centrally. Mean right pulmonary artery pressure measurement was 47. A Rosen guidewire was advanced through the catheter into the right lower lobe pulmonary artery.  In a similar fashion, the Bernstein catheter was utilized to manipulate access into the left lower lobe pulmonary artery. A second Rosen guidewire was advanced into the left lower lobe pulmonary artery peripherally.  Over both Rosen guidewires, the EKOS infusion catheters were advanced into the lower lobe pulmonary arteries. Left pulmonary artery infusion catheter has an infusion length of 12 cm and the right pulmonary artery catheter has an infusion length of 18 cm.  Images obtained for documentation.  Catheters were secured at the right common femoral venous access sites.  TPA infusion will be initiated at a rate of 1mg /hr through each catheter for 12 hr (total infusion 24 mg).  Follow-up PA pressure measurements will be performed at 12 hrs.  IMPRESSION: Extensive acute saddle embolus/bilateral pulmonary emboli, with evidence of right  heart strain by CTA and echo  Initial mean right pulmonary artery pressure 47.  Successful insertion of bilateral pulmonary artery EKOS infusion catheters to performed PE thrombolysis, as above.   Electronically Signed   By: Ruel Favorsrevor  Shick M.D.   On: 03/15/2014 09:47   Ir Infusion Thrombol Arterial Initial (ms)  03/15/2014   CLINICAL DATA:  Acute symptomatic large volume side hole embolus, bilateral pulmonary emboli, tachycardia, labored breathing  EXAM: ULTRASOUND GUIDANCE FOR VASCULAR ACCESS  BILATERAL PULMONARY ARTERY CATHETERIZATIONS  PULMONARY ANGIOGRAM  INITIATION OF TRANSCATHETER PULMONARY EMBOLUS THROMBOLYSIS  Date:  4/28/20154/28/2015 11:51 PM  Radiologist:  Judie PetitM. Ruel Favorsrevor Shick, MD  Guidance:  Ultrasound and fluoroscopic  FLUOROSCOPY TIME:  16 min 6 seconds  MEDICATIONS AND MEDICAL HISTORY: 2 mg Versed, 50 mcg fentanyl  ANESTHESIA/SEDATION: 30 min  CONTRAST:  10mL OMNIPAQUE IOHEXOL 300 MG/ML  SOLN  COMPLICATIONS: No complications  PROCEDURE: Informed consent was obtained from the patient following explanation of the procedure, risks, benefits and alternatives. The patient understands, agrees and  consents for the procedure. All questions were addressed. A time out was performed.  Maximal barrier sterile technique utilized including caps, mask, sterile gowns, sterile gloves, large sterile drape, hand hygiene, and chlor prep.  Under sterile conditions and local anesthesia, ultrasound micropuncture access was performed of the right common femoral vein. This was performed twice into the right common femoral vein for 2 tandem 7 French sheaths. Over a Bentson guidewires, catheter access was manipulated through the right heart into the lower lobe pulmonary arteries bilaterally. For right pulmonary artery access initially a 7 French AP 2 pigtail catheter was directed into the right pulmonary artery. This was exchanged for a 100 cm Berenstein catheter over a Glidewire. Access was advanced more peripherally into the  right pulmonary artery extending into the lower lobe.  Contrast injection performed for a limited pulmonary angiogram. This demonstrated occlusive diffuse right hilar pulmonary embolus extending into the upper and lower lobes as well as saddle thrombus centrally. Mean right pulmonary artery pressure measurement was 47. A Rosen guidewire was advanced through the catheter into the right lower lobe pulmonary artery.  In a similar fashion, the Bernstein catheter was utilized to manipulate access into the left lower lobe pulmonary artery. A second Rosen guidewire was advanced into the left lower lobe pulmonary artery peripherally.  Over both Rosen guidewires, the EKOS infusion catheters were advanced into the lower lobe pulmonary arteries. Left pulmonary artery infusion catheter has an infusion length of 12 cm and the right pulmonary artery catheter has an infusion length of 18 cm.  Images obtained for documentation.  Catheters were secured at the right common femoral venous access sites.  TPA infusion will be initiated at a rate of 1mg /hr through each catheter for 12 hr (total infusion 24 mg).  Follow-up PA pressure measurements will be performed at 12 hrs.  IMPRESSION: Extensive acute saddle embolus/bilateral pulmonary emboli, with evidence of right heart strain by CTA and echo  Initial mean right pulmonary artery pressure 47.  Successful insertion of bilateral pulmonary artery EKOS infusion catheters to performed PE thrombolysis, as above.   Electronically Signed   By: Ruel Favorsrevor  Shick M.D.   On: 03/15/2014 09:47   Ir Infusion Thrombol Arterial Initial (ms)  03/15/2014   CLINICAL DATA:  Acute symptomatic large volume side hole embolus, bilateral pulmonary emboli, tachycardia, labored breathing  EXAM: ULTRASOUND GUIDANCE FOR VASCULAR ACCESS  BILATERAL PULMONARY ARTERY CATHETERIZATIONS  PULMONARY ANGIOGRAM  INITIATION OF TRANSCATHETER PULMONARY EMBOLUS THROMBOLYSIS  Date:  4/28/20154/28/2015 11:51 PM  Radiologist:  Judie PetitM.  Ruel Favorsrevor Shick, MD  Guidance:  Ultrasound and fluoroscopic  FLUOROSCOPY TIME:  16 min 6 seconds  MEDICATIONS AND MEDICAL HISTORY: 2 mg Versed, 50 mcg fentanyl  ANESTHESIA/SEDATION: 30 min  CONTRAST:  10mL OMNIPAQUE IOHEXOL 300 MG/ML  SOLN  COMPLICATIONS: No complications  PROCEDURE: Informed consent was obtained from the patient following explanation of the procedure, risks, benefits and alternatives. The patient understands, agrees and consents for the procedure. All questions were addressed. A time out was performed.  Maximal barrier sterile technique utilized including caps, mask, sterile gowns, sterile gloves, large sterile drape, hand hygiene, and chlor prep.  Under sterile conditions and local anesthesia, ultrasound micropuncture access was performed of the right common femoral vein. This was performed twice into the right common femoral vein for 2 tandem 7 French sheaths. Over a Bentson guidewires, catheter access was manipulated through the right heart into the lower lobe pulmonary arteries bilaterally. For right pulmonary artery access initially a 7 [place] AP 2 pigtail  catheter was directed into the right pulmonary artery. This was exchanged for a 100 cm Berenstein catheter over a Glidewire. Access was advanced more peripherally into the right pulmonary artery extending into the lower lobe.  Contrast injection performed for a limited pulmonary angiogram. This demonstrated occlusive diffuse right hilar pulmonary embolus extending into the upper and lower lobes as well as saddle thrombus centrally. Mean right pulmonary artery pressure measurement was 47. A Rosen guidewire was advanced through the catheter into the right lower lobe pulmonary artery.  In a similar fashion, the Bernstein catheter was utilized to manipulate access into the left lower lobe pulmonary artery. A second Rosen guidewire was advanced into the left lower lobe pulmonary artery peripherally.  Over both Rosen guidewires, the EKOS infusion  catheters were advanced into the lower lobe pulmonary arteries. Left pulmonary artery infusion catheter has an infusion length of 12 cm and the right pulmonary artery catheter has an infusion length of 18 cm.  Images obtained for documentation.  Catheters were secured at the right common femoral venous access sites.  TPA infusion will be initiated at a rate of 1mg /hr through each catheter for 12 hr (total infusion 24 mg).  Follow-up PA pressure measurements will be performed at 12 hrs.  IMPRESSION: Extensive acute saddle embolus/bilateral pulmonary emboli, with evidence of right heart strain by CTA and echo  Initial mean right pulmonary artery pressure 47.  Successful insertion of bilateral pulmonary artery EKOS infusion catheters to performed PE thrombolysis, as above.   Electronically Signed   By: Ruel Favors M.D.   On: 03/15/2014 09:47   Ir Rande Lawman F/u Eval Art/ven Final Day (ms)  03/15/2014   CLINICAL DATA:  Pulmonary thromboembolism.  Post 12 hr of lysis.  EXAM: IR THROMB F/U EVAL ART/VEN FINAL DAY  FLUOROSCOPY TIME:  12 seconds.  MEDICATIONS AND MEDICAL HISTORY: None  ANESTHESIA/SEDATION: None  CONTRAST:  None  PROCEDURE: The procedure, risks, benefits, and alternatives were explained to the patient. Questions regarding the procedure were encouraged and answered. The patient understands and consents to the procedure.  Fluoroscopic imaging was performed to confirm stable position of the right and left pulmonary artery catheters. Pulmonary artery pressures within measured. Pulmonary artery pressures were measured as follows:  Right pulmonary artery pressure:  45/20 with a mean of 28 mm Hg.  Left pulmonary artery pressure:  51/20 with a mean of 30 mm Hg.  The sheaths were removed and hemostasis was achieved with direct pressure and a VPAD device.  FINDINGS: Fluoroscopic imaging confirms stable position of the catheters.  COMPLICATIONS: None  IMPRESSION: There is been a dramatic improvement in the pulmonary  artery pressures compared with pre slices measurements. The mean right pulmonary artery pressure has dropped from 47 to 28 mm Hg.   Electronically Signed   By: Maryclare Bean M.D.   On: 03/15/2014 16:57    ASSESSMENT / PLAN: PULMONARY  A: Acute hypoxic respiratory failure in setting of massive B/L pulmonary emboli following recent hospitalization/rehab for acute CVA.  [place] weaned from 4L to 2L. P:  Wean FiO2  Heparin IV  Catheter directed tPA completed 04/29, repeat CTA planned for 5/1 Will need to decide how to treat him chronically > warfarin probably best agent since he has a hx of recent CVA. Would like to start as soon as IR says OK Will need an IVC filter, goal removal in few months   CARDIOVASCULAR  A: Elevated CEs in setting of PE  P:  Telemetry PE treatment as  above Hold antihypertensives  Will discuss with IR when OK to restart ASA and Plavix    RENAL  A: Metabolic acidosis + AG, resolving; lactic acid WNL     Hypokalemia P:  Monitor BMP 30 mEq Kdur x 2   GASTROINTESTINAL  A:  No acute abnormality  P:  Carb modified diet GI ppx - Protonix   HEMATOLOGIC  A: Mild leukocytosis  P:  Trend fever curve  Trend CBC   INFECTIOUS  A: No acute abnormality P:  Trend fever curve    ENDOCRINE  A:  DM  P:  SSI CBG monitoring   NEUROLOGIC  A: H/o CVA w/ right sided deficits  P:  Resume rehab efforts when hemodynamically stable    Evelena Peat, DO IMTS, PGY1 03/16/2014, 6:28 AM  Levy Pupa, MD, PhD 03/16/2014, 8:53 AM [facility] [phone] or if no answer [phone]

## 2014-03-16 NOTE — Progress Notes (Signed)
eLink Physician-Brief Progress Note Patient Name: Dakota AnoRoger W Mcnamara DOB: 09/06/1951 MRN: [id number]  Date of Service  03/16/2014   HPI/Events of Note   hypertension  eICU Interventions  SBP > 160   Intervention Category Intermediate Interventions: Hypertension - evaluation and management  Lupita LeashDouglas B McQuaid 03/16/2014, 11:45 PM

## 2014-03-16 NOTE — Progress Notes (Signed)
Pressure applied to right groin x20 mins. Dressing changed, no further bleeding at this time, Paged Dr. Bonnielee HaffHoss.

## 2014-03-16 NOTE — Progress Notes (Signed)
Pt complaining of a headache. Pts BP is 187/83. [facility] notified. New orders given

## 2014-03-16 NOTE — Progress Notes (Signed)
NUTRITION FOLLOW UP  Intervention:   D/C Resource Breeze daily Ensure Complete po BID, each supplement providing 350 kcals and 13 grams of protein  RD to continue to follow nutrition care plan   Nutrition Dx:   Inadequate oral intake related to decreased appetite as evidenced by pt report of eating 50% of meal intake; ongoing.   Goal:   Meet >/= 90% of estimated nutrition needs   Monitor:   PO intake, supplement acceptance, weight trend, labs   Assessment:   63 year old male, residing at S/p recent CVA w/ right sided motor deficits. Began to c/o CP and worsening dyspnea first noted on 4/27 during PT. Sent to ER 4/28 for evaluation. 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   PMHx significant for DM, HTN, stroke, obesity.   Pt reports poor appetite. Pt states that he ate most of his breakfast, and no lunch. Pt was drinking resource breeze while in the room.   Pt's diet advanced to Carb Modified on 4/29. Pt stated that he liked chocolate and would like to try Ensure.   Height: Ht Readings from Last 1 Encounters:  03/14/14 5\' 8"  (1.727 m)    Weight Status:   Wt Readings from Last 1 Encounters:  03/16/14 235 lb 7.2 oz (106.8 kg)    Re-estimated needs:  Kcal: 2000-2200 Protein: 110 - 125 g Fluid: >/= 2 L/ day  Skin: Ecchymosis on abdomen  Diet Order: Carb Control with Resource Breeze once daily   Intake/Output Summary (Last 24 hours) at 03/16/14 1508 Last data filed at 03/16/14 1400  Gross per 24 hour  Intake 2784.5 ml  Output   2275 ml  Net  509.5 ml    Last BM: PTA  Labs:   Recent Labs Lab 03/14/14 1130 03/15/14 0532 03/16/14 0332  NA 138 149* 141  K 4.0 3.8 3.2*  CL 101 111 106  CO2 15* 23 20  BUN 25* 25* 18  CREATININE 1.12 1.23 0.97  CALCIUM 9.6 9.1 8.3*  GLUCOSE 252* 138* 104*    CBG (last 3)   Recent Labs  03/15/14 1131 03/15/14 1510 03/16/14 1151  GLUCAP 109* 103* 131*    Scheduled Meds: . amLODipine   10 mg Oral Daily  . antiseptic oral rinse  15 mL Mouth Rinse BID  . ARIPiprazole  2 mg Oral Daily  . feeding supplement (RESOURCE BREEZE)  1 Container Oral Q lunch  . insulin aspart  0-9 Units Subcutaneous TID WC  . pantoprazole  40 mg Oral QHS  . sertraline  300 mg Oral Daily  . simvastatin  20 mg Oral q1800  . warfarin  7.5 mg Oral ONCE-1800  . Warfarin - Pharmacist Dosing Inpatient   Does not apply q1800    Continuous Infusions: . sodium chloride 100 mL/hr at 03/16/14 0731  . heparin 1,550 Units/hr (03/16/14 0700)    Eppie Gibsonebekah L Matznick, BS Dietetic Intern Pager: [phone]  I agree with student dietitian note; appropriate revisions have been made.  Joaquin CourtsKimberly Velvia Mehrer, RD, LDN, CNSC Pager# [phone] After Hours Pager# [phone]

## 2014-03-16 NOTE — Progress Notes (Signed)
eLink Physician-Brief Progress Note Patient Name: Dakota Hall DOB: 03/30/1951 MRN: [id number]  Date of Service  03/16/2014   HPI/Events of Note  RN calling from floor. Says bleeding frm right groin site. No active catheter there  eICU Interventions  Dc iv heparin No coumadin today Local tamponade x 20 mintues RN to call radiology per order in epic Reassess   Intervention Category Major Interventions: Hemorrhage - evaluation and management  [name] 03/16/2014, 5:57 PM

## 2014-03-16 NOTE — Progress Notes (Signed)
IR has discussed possible IVC filter placement with Dr. Delton CoombesByrum today. The patient did have a LE venous duplex study done on 03/14/14 which revealed mainly superficial RLE clot and no LLE clot. Pelvic clot burden is unknown and the patient did have massive bilateral PE s/p catheter directed lysis. A repeat CTA will be performed tomorrow and if significant PE burden exists, IR will place IVC filter. Dr. Bonnielee HaffHoss has discussed this today over the phone with Dr. Delton CoombesByrum. I will make the patient NPO after midnight in case of filter placement.   Pattricia BossKoreen Niasia Lanphear PA-C Interventional Radiology  03/16/14  3:18 PM

## 2014-03-16 NOTE — Progress Notes (Signed)
Assessed groin, gauze saturated with blood. Pressure applied and dressing changed. Will continue to monitor.

## 2014-03-16 NOTE — Evaluation (Signed)
Physical Therapy Evaluation Patient Details Name: Dakota Hall MRN: [id number] DOB: 10/07/1951 Today's Date: 03/16/2014   History of Present Illness  Pt adm with massive saddle PE. Pt with recent Lt MCA stroke and had just been dc'd from CIR to SNF.  Clinical Impression  Pt admitted with above. Pt currently with functional limitations due to the deficits listed below (see PT Problem List).  Pt will benefit from skilled PT to increase their independence and safety with mobility to allow discharge back to SNF for further rehab.    Follow Up Recommendations SNF    Equipment Recommendations  Other (comment) (To be assessed)    Recommendations for Other Services       Precautions / Restrictions Precautions Precautions: Fall Precaution Comments: Rt hemiparesis       Mobility  Bed Mobility                  Transfers Overall transfer level: Needs assistance Equipment used: 1 person hand held assist Transfers: Sit to/from Stand Sit to Stand: Mod assist         General transfer comment: Assist with bringing hips up and to block rt knee. Facilitation to reach full trunk, hip, and knee extension.  Ambulation/Gait                Stairs            Wheelchair Mobility    Modified Rankin (Stroke Patients Only)       Balance Overall balance assessment: Needs assistance         Standing balance support: Single extremity supported Standing balance-Leahy Scale: Poor Standing balance comment: Pt stood 3x for 1-3 minutes with min A to maintain once standing. Pt held on to my forearm with his lt arm. Assisted pt to shift weight to midline. Tendency to lean rt..                             Pertinent Vitals/Pain VSS    Home Living Family/patient expects to be discharged to:: Skilled nursing facility Living Arrangements: Spouse/significant other Available Help at Discharge: Family;Available 24 hours/day Type of Home: House Home Access: Stairs  to enter Entrance Stairs-Rails: Can reach both;Right;Left Entrance Stairs-Number of Steps: 4 Home Layout: Two level;Bed/bath upstairs;1/2 bath on main level        Prior Function Level of Independence: Needs assistance   Gait / Transfers Assistance Needed: Since CVA last month pt amb short distance (40') with mod A and using rt AFO and rt hand splint to hold rt hand on walker.     Comments: Prior to stroke pt was totally indpendent.     Hand Dominance   Dominant Hand: Right    Extremity/Trunk Assessment   Upper Extremity Assessment: Defer to OT evaluation           Lower Extremity Assessment: RLE deficits/detail RLE Deficits / Details: ankle 0/5, hip and knee grossly 2-/5       Communication      Cognition Arousal/Alertness: Awake/alert Behavior During Therapy: WFL for tasks assessed/performed Overall Cognitive Status: Impaired/Different from baseline                 General Comments: Pt emotionally labile.    General Comments      Exercises General Exercises - Lower Extremity Short Arc QuadBarbaraann Hall: AAROM;Right;10 reps;Seated Hip Flexion/Marching: AAROM;Right;5 reps;Seated      Assessment/Plan    PT Assessment Patient needs continued  PT services  PT Diagnosis Difficulty walking;Hemiplegia dominant side   PT Problem List Decreased strength;Decreased balance;Decreased activity tolerance;Decreased mobility;Decreased knowledge of use of DME;Impaired sensation  PT Treatment Interventions DME instruction;Gait training;Functional mobility training;Therapeutic activities;Therapeutic exercise;Balance training;Patient/family education;Neuromuscular re-education   PT Goals (Current goals can be found in the Care Plan section) Acute Rehab PT Goals Patient Stated Goal: Get stronger PT Goal Formulation: With patient Time For Goal Achievement: 03/23/14 Potential to Achieve Goals: Good    Frequency Min 3X/week   Barriers to discharge        Co-evaluation                End of Session Equipment Utilized During Treatment: Gait belt Activity Tolerance: Patient tolerated treatment well Patient left: in chair;with call bell/phone within reach;with family/visitor present Nurse Communication: Mobility status         Time: 4540-98110946-1010 PT Time Calculation (min): 24 min   Charges:   PT Evaluation $Initial PT Evaluation Tier I: 1 Procedure PT Treatments $Gait Training: 8-22 mins   PT G CodesAngelina Hall:          Dakota Hall 03/16/2014, 11:38 AM  Dakota Hall PT [phone]

## 2014-03-16 NOTE — Progress Notes (Addendum)
ANTICOAGULATION CONSULT NOTE - Follow Up Consult  Pharmacy Consult for heparin Indication: pulmonary embolus  Labs:  Recent Labs  03/14/14 1130  03/15/14 0532 03/15/14 1130 03/15/14 2101 03/16/14 0332  HGB 16.7  < > 14.7 13.2 13.2 12.5*  HCT 47.5  < > 43.4 37.8* 37.5* 35.7*  PLT 139*  < > 117* 99* 93* 103*  APTT 35  --   --   --   --   --   LABPROT 14.9  --   --   --   --   --   INR 1.20  --   --   --   --   --   HEPARINUNFRC  --   < > 0.34 0.29* 0.23* 0.35  CREATININE 1.12  --  1.23  --   --   --   < > = values in this interval not displayed.   Assessment/Plan:  63yo male now therapeutic on heparin after rate adjustment, lower goal for now given recent CVA. Will continue gtt at current rate and confirm stable with additional level.   Vernard GamblesVeronda Bryk, PharmD, BCPS  03/16/2014,4:52 AM   Addendum: Repeat heparin level is therapeutic. No issues with gtt. No bleeding. CCM has received approval from IR to start Coumadin therapy. HL is therapeutic. Baseline INR was 1.20. H/H has slowly trended down but no bleeding noted. Last LFTs or albumin in March within normal limits. Coumadin score 6.  Plan: Continue heparin at current rate of 1550 units/hr. Coumadin 7.5mg  po x1 tonight. Daily PT/INR while on therapy. Continue daily HL and CBC.   Link SnufferJessica Ayauna Mcnay, PharmD, BCPS Clinical Pharmacist [phone] 03/16/2014, 3:02 PM

## 2014-03-17 ENCOUNTER — Inpatient Hospital Stay (HOSPITAL_COMMUNITY): Payer: Managed Care, Other (non HMO)

## 2014-03-17 LAB — CBC
HCT: 38.5 % — ABNORMAL LOW (ref 39.0–52.0)
HEMOGLOBIN: 13.3 g/dL (ref 13.0–17.0)
MCH: 30 pg (ref 26.0–34.0)
MCHC: 34.5 g/dL (ref 30.0–36.0)
MCV: 86.9 fL (ref 78.0–100.0)
Platelets: 141 10*3/uL — ABNORMAL LOW (ref 150–400)
RBC: 4.43 MIL/uL (ref 4.22–5.81)
RDW: 12.6 % (ref 11.5–15.5)
WBC: 14.3 10*3/uL — ABNORMAL HIGH (ref 4.0–10.5)

## 2014-03-17 LAB — GLUCOSE, CAPILLARY
GLUCOSE-CAPILLARY: 130 mg/dL — AB (ref 70–99)
GLUCOSE-CAPILLARY: 123 mg/dL — AB (ref 70–99)
Glucose-Capillary: 92 mg/dL (ref 70–99)
Glucose-Capillary: 135 mg/dL — ABNORMAL HIGH (ref 70–99)

## 2014-03-17 LAB — BASIC METABOLIC PANEL
BUN: 11 mg/dL (ref 6–23)
CALCIUM: 8.9 mg/dL (ref 8.4–10.5)
CO2: 17 mEq/L — ABNORMAL LOW (ref 19–32)
CREATININE: 0.82 mg/dL (ref 0.50–1.35)
Chloride: 103 mEq/L (ref 96–112)
GFR calc Af Amer: >90 mL/min (ref >90)
GFR calc non Af Amer: >90 mL/min (ref >90)
GLUCOSE: 128 mg/dL — AB (ref 70–99)
Potassium: 3.4 mEq/L — ABNORMAL LOW (ref 3.7–5.3)
SODIUM: 141 meq/L (ref 137–147)

## 2014-03-17 LAB — HOMOCYSTEINE: Homocysteine: 13.5 umol/L (ref 4.0–15.4)

## 2014-03-17 LAB — PROTIME-INR
INR: 1.23 (ref 0.00–1.49)
Prothrombin Time: 15.2 seconds (ref 11.6–15.2)

## 2014-03-17 LAB — BETA-2-GLYCOPROTEIN I ABS, IGG/M/A
Beta-2 Glyco I IgG: 8 G Units (ref <20)
Beta-2-Glycoprotein I IgA: 11 A Units (ref <20)
Beta-2-Glycoprotein I IgM: 7 M Units (ref <20)

## 2014-03-17 MED ORDER — HEPARIN (PORCINE) IN NACL 100-0.45 UNIT/ML-% IJ SOLN
1450.0000 [IU]/h | INTRAMUSCULAR | Status: AC
  Administered 2014-03-17 – 2014-03-21 (×6): 1450 [IU]/h via INTRAVENOUS
  Filled 2014-03-17 (×10): qty 250

## 2014-03-17 MED ORDER — HYDROCHLOROTHIAZIDE 12.5 MG PO CAPS
12.5000 mg | ORAL_CAPSULE | Freq: Every day | ORAL | Status: DC
  Administered 2014-03-17 – 2014-03-22 (×6): 12.5 mg via ORAL
  Filled 2014-03-17 (×6): qty 1

## 2014-03-17 MED ORDER — IOHEXOL 350 MG/ML SOLN
100.0000 mL | Freq: Once | INTRAVENOUS | Status: AC | PRN
  Administered 2014-03-17: 100 mL via INTRAVENOUS

## 2014-03-17 MED ORDER — POTASSIUM CHLORIDE CRYS ER 20 MEQ PO TBCR
40.0000 meq | EXTENDED_RELEASE_TABLET | Freq: Three times a day (TID) | ORAL | Status: AC
Start: 2014-03-17 — End: 2014-03-17
  Administered 2014-03-17 (×2): 40 meq via ORAL
  Filled 2014-03-17 (×2): qty 2

## 2014-03-17 MED ORDER — IRBESARTAN 150 MG PO TABS
150.0000 mg | ORAL_TABLET | Freq: Every day | ORAL | Status: DC
  Administered 2014-03-17 – 2014-03-22 (×6): 150 mg via ORAL
  Filled 2014-03-17 (×6): qty 1

## 2014-03-17 NOTE — Clinical Social Work Psychosocial (Signed)
Clinical Social Work Department  BRIEF PSYCHOSOCIAL ASSESSMENT  Patient: Dakota Hall  Account Number: [id number]   Admit date: 03/14/14 Clinical Social Worker Rhea Pink, MSW Date/Time: 03/17/2014 11:00 AM Referred by: Physician Date Referred: [facility] Referred for   Admitted from a facility  Other Referral:  Interview type: Patient and patient's wife at bedside Other interview type: PSYCHOSOCIAL DATA  Living Status:spouse Admitted from facility: [facility] Level of care: SNF Primary support name: Travez Stancil Primary support relationship to patient: Spouse Degree of support available:  Strong and vested  CURRENT CONCERNS  Current Concerns   Post-Acute Placement   Other Concerns:  SOCIAL WORK ASSESSMENT / PLAN  CSW met with patient and patient's spouse at Bedside to offer support and discuss returning to [facility]. The patient's family reported that they already spoke with a Education officer, museum and they are agreeable to returning. See Below. Patient and patietn's spouse expressed appreciation for CSW assist and support    CSW consult to pt/family regarding concerns about returning to nursing facility, [facility] [phone]. CSW spoke with pt/family to provide support. CSW also spoke with Melissa in admissions to report that it is the pt's intention to return to facility. Admissions reported that pt continues to have his bed at the [facility]. Facility will be notified when pt is to discharge. CSW shared information with pt/family and they were appreciative of CSW efforts.         Assessment/plan status: Information/Referral to [employer]  Other assessment/ plan:  Information/referral to community resources:  SNF   PTAR  PATIENT'S/FAMILY'S RESPONSE TO PLAN OF CARE:  Patient is agreeable to returning to [facility] when Medically stable. Melisssa at [facility] is aware. Patient's family was appreciative of support and information provided  by CSW. CSW will continue to follow and assist with all d/c needs.   Rhea Pink, MSW, [place]

## 2014-03-17 NOTE — Progress Notes (Signed)
RT set machine up for patient.  Patient was not ready to go on CPAP at this time.  Patient is using his home mask and the hospital machine.  Patient was comfortable with placing himself on CPAP when he was ready for bed.  RT will continue to monitor.

## 2014-03-17 NOTE — Progress Notes (Signed)
Physical Therapy Treatment Patient Details Name: Dakota Hall MRN: [id number] DOB: 01/27/1951 Today's Date: 03/17/2014    History of Present Illness Pt adm with massive saddle PE. Pt with recent Lt MCA stroke and had just been dc'd from CIR to SNF.    PT Comments    Pt agreeable to participate in therapy.  Required mod assist for sit<>stand transfer & +2 to attempt ambulation.  Pt only able to take ~3 steps forwards + 3 steps backwards today.    Follow Up Recommendations  SNF     Equipment Recommendations       Recommendations for Other Services       Precautions / Restrictions Precautions Precautions: Fall Precaution Comments: Rt hemiparesis     Mobility  Bed Mobility Overal bed mobility: Needs Assistance Bed Mobility: Supine to Sit;Sit to Supine     Supine to sit: Mod assist Sit to supine: +2 for physical assistance;Mod assist   General bed mobility comments: cues for technique.  (A) to lift shoulders/trunk to sitting upright, pivot hips around to EOB, use of draw pad to bring hips closer to EOB, & to lift LE's back into bed while supporting shoulders/trunk back into supine position  Transfers Overall transfer level: Needs assistance Equipment used: 1 person hand held assist Transfers: Sit to/from Stand Sit to Stand: Mod assist         General transfer comment: Assist with bringing hips up and to block rt knee. Facilitation to reach full trunk, hip, and knee extension.  Ambulation/Gait Ambulation/Gait assistance: +2 physical assistance Ambulation Distance (Feet):  (3 steps forwards + backwards; [facility]  )         General Gait Details: (A) for balance, lateral weight shifting, & to move RLE.  Blocking of Rt knee with Lt step.     Stairs            Wheelchair Mobility    Modified Rankin (Stroke Patients Only)       Balance                                    Cognition Arousal/Alertness: Awake/alert Behavior During  Therapy: WFL for tasks assessed/performed                        Exercises      General Comments        Pertinent Vitals/Pain No reports of pain.  Pt does c/o dizziness sitting EOB-- BP 177/83.  RN notified & ok'd to proceed with therapy.      Home Living                      Prior Function            PT Goals (current goals can now be found in the care plan section) Acute Rehab PT Goals Patient Stated Goal: Get stronger PT Goal Formulation: With patient Time For Goal Achievement: 03/23/14 Potential to Achieve Goals: Good Progress towards PT goals: Progressing toward goals    Frequency  Min 3X/week    PT Plan Current plan remains appropriate    Co-evaluation             End of Session Equipment Utilized During Treatment: Gait belt Activity Tolerance: Patient limited by fatigue Patient left: in bed;with call bell/phone within reach;with bed alarm set;with family/visitor present  Time: [id number] PT Time Calculation (min): 25 min  Charges:  $Therapeutic Activity: 23-37 mins                      Lara MulchKelly Lynn Detron Carras 03/17/2014, 1:27 PM  Verdell FaceKelly Aidan Caloca, PTA [phone] 03/17/2014

## 2014-03-17 NOTE — Progress Notes (Signed)
eLink Physician-Brief Progress Note Patient Name: Dakota Hall DOB: 12/14/1950 MRN: [id number]  Date of Service  03/17/2014   HPI/Events of Note  RN askign if patient  Needs to remain NPO for possible IVC filter placement. Sign out notes indicate :No filter if clot burden is decreasing  CT below shows clot burden has decreased  Additionally,  Patient heparin turned off 24h ago due to groin bleed. No bleeding since then. Clinically well per RN  Ct Angio Chest Pe W/cm &/or Wo Cm  03/17/2014   CLINICAL DATA:  63 year old male with sub massive PE diagnosed on 03/14/2014 now 48 hr status post catheter directed ultrasound assisted thrombolysis (USAT). Re-evaluate RV/LV ratio and clot burden.  EXAM: CT ANGIOGRAPHY CHEST WITH CONTRAST  TECHNIQUE: Multidetector CT imaging of the chest was performed using the standard protocol during bolus administration of intravenous contrast. Multiplanar CT image reconstructions and MIPs were obtained to evaluate the vascular anatomy.  CONTRAST:  100mL OMNIPAQUE IOHEXOL 350 MG/ML SOLN  COMPARISON:  Prior CT PE study 03/14/2014  FINDINGS: Mediastinum: Unremarkable CT appearance of the thyroid gland. No suspicious mediastinal or hilar adenopathy. No soft tissue mediastinal mass. The thoracic esophagus is unremarkable.  Borderline enlarged right paratracheal lymph node measures 9 mm in short axis.  Heart/Vascular: Adequate opacification of the main pulmonary arteries to the proximal segmental level. There has been a significant interval decrease in the overall clot burden over the past 48 hr. A saddle embolus is now resolved. There is some residual nonocclusive thrombus in the left lower lobe pulmonary artery and left upper lobe pulmonary arteries extending into segmental branches. On the right, there is persistent thrombus within segmental branches of the right upper lobe pulmonary artery and with in the right lower lobe pulmonary artery extending into segmental and subsegmental  branches. Again, the clot burden overall is improved. Significant reduction in the RV to LV ratio which measures 1.04 today compared to 3.15. Persistent mild cardiomegaly. No pericardial effusion.  Lungs/Pleura: No pleural effusion. Mild dependent atelectasis. Patchy airspace opacity in the inferior and medial right upper lobe may represent atelectasis or very small amounts of alveolar hemorrhage.  Bones/Soft Tissues: No acute fracture or aggressive appearing lytic or blastic osseous lesion.  Upper Abdomen: Unremarkable imaged upper abdomen.  Review of the MIP images confirms the above findings.  IMPRESSION: Significantly decreased pulmonary embolic burden with significant reduction in right heart strain and near complete normalization of the RV/LV ratio. The RV/LV ratio today measures 1.04 compared to 3.15 previously (less than 0.9 is considered normal).   Electronically Signed   By: Malachy MoanHeath  McCullough M.D.   On: 03/17/2014 14:08     eICU Interventions  Restart IV heparin per pharmacy consult No IVC filteer per sign out notes Carb modified diet   Intervention Category Major Interventions: Other:  Kalman ShanMurali Irelynn Schermerhorn 03/17/2014, 5:47 PM

## 2014-03-17 NOTE — Progress Notes (Signed)
Reassessed groin area. Bleeding as stopped. MD at bedside.

## 2014-03-17 NOTE — Progress Notes (Signed)
Called to evaluate R groin hematoma in the setting of reinitiation of heparin therapy. Patient denies R groin pain. On exam subtle ~ 2cm circular area of firmness deep to right groin puncture site. Non-tender to palpation. Will continue to observe for now.

## 2014-03-17 NOTE — Progress Notes (Signed)
eLink Physician-Brief Progress Note Patient Name: Dakota AnoRoger W Riebe DOB: 04/11/1951 MRN: [id number]  Date of Service  03/17/2014   HPI/Events of Note   Hypertension  eICU Interventions  Add back home ARB and HCTZ   Intervention Category Major Interventions: Hypertension - evaluation and management  Lupita LeashDouglas B McQuaid 03/17/2014, 5:47 AM

## 2014-03-17 NOTE — Care Management Note (Addendum)
    Page 1 of 1   03/22/2014     10:45:37 AM CARE MANAGEMENT NOTE 03/22/2014  Patient:  Dakota Hall,Dakota Hall   Account Number:  [id number]  Date Initiated:  03/17/2014  Documentation initiated by:  Letha CapeAYLOR,Destinae Neubecker  Subjective/Objective Assessment:   dx pe  admit- lives with spouse.     Action/Plan:   for possible  ivc filter  pt eval- rec snf.   Anticipated DC Date:  03/22/2014   Anticipated DC Plan:  SKILLED NURSING FACILITY  In-house referral  Clinical Social Worker      DC Planning Services  CM consult      Choice offered to / List presented to:             Status of service:  Completed, signed off Medicare Important Message given?   (If response is "NO", the following Medicare IM given date fields will be blank) Date Medicare IM given:   Date Additional Medicare IM given:    Discharge Disposition:  SKILLED NURSING FACILITY  Per UR Regulation:  Reviewed for med. necessity/level of care/duration of stay  If discussed at Long Length of Stay Meetings, dates discussed:    Comments:  03/22/14 1044 Letha Capeeborah Quintez Maselli RN, BSN [phone] patient is for dc today.  03/21/14 1556 Letha Capeeborah Dorean Daniello RN, BSN [phone] [facility], CSW following.

## 2014-03-17 NOTE — Progress Notes (Signed)
PULMONARY / CRITICAL CARE MEDICINE   Name: Dakota AnoRoger W Colarusso MRN: [id number] DOB: 06/22/1951    ADMISSION DATE:  03/14/2014 CONSULTATION DATE:  03/14/2014  REFERRING MD : EDP  PRIMARY SERVICE: Pulmonary   CHIEF COMPLAINT: Acute resp failure   BRIEF PATIENT DESCRIPTION:  63 year old male, residing at S/p recent CVA w/ right sided motor deficits. Began to c/o CP and worsening dyspnea firt noted on 4/27 during PT. O2 sats recorded in 70% range on room air. Sent to ER 4/28 for evaluation. Dx eval showed massive saddle embolus w/ evidence or right heart strain. PCCM asked to admit.   SIGNIFICANT EVENTS / STUDIES:  CT chest 4/28: Massive bilateral pulmonary emboli with saddle embolus and evidence of right heart strain/failure.  ECHO 4/28 >>> EF 55-60%, RV hemodynamic compromise from probable large saddle pulmonary embolus.  There is thrombus in transit in the RA.   LE Doppler US 4/28 >>> acute DVT throughout popliteal vein; age indeterminate DVT throughout posterior tibial vein; acute SVT involving proximal segment of lesser saphenous vein. CT chest 04/30 >>>  LINES / TUBES:  04/28 B/L PA infusion catheters  CULTURES:  Blood cultures x 2  04/28>>>  ANTIBIOTICS: None   INTERVAL HISTORY:  PA catheters removed yesterday.  Patient hemodynamically stable.  SpO2 upper 90s on 2L via [place].  He denies dyspnea or chest pain.  VITAL SIGNS: Temp:  [98.1 F (36.7 C)-99.9 F (37.7 C)] 98.2 F (36.8 C) (05/01 0531) Pulse Rate:  [68-83] 83 (05/01 0531) Resp:  [20-30] 22 (05/01 0531) BP: (157-187)/(60-87) 177/83 mmHg (05/01 0930) SpO2:  [96 %-99 %] 97 % (05/01 0531) Weight:  [233 lb 11 oz (106 kg)-235 lb 11.2 oz (106.913 kg)] 233 lb 11 oz (106 kg) (05/01 0531)  HEMODYNAMICS:   VENTILATOR SETTINGS:  N/A  INTAKE / OUTPUT: Intake/Output     04/30 0701 - 05/01 0700 05/01 0701 - 05/02 0700   P.O.  0   I.V. (mL/kg) 1039.5 (9.8)    Total Intake(mL/kg) 1039.5 (9.8)    Urine (mL/kg/hr) 2350 (0.9) 250  (0.7)   Total Output 2350 250   Net -1310.5 -250         PHYSICAL EXAMINATION: General:  In NAD Neuro:  AAO x 3, responding appropriately, CN2-12 grossly intact HEENT:  WNL Cardiovascular:  RRR Lungs:  Decreased BS at the bases anteriorly, otherwise clear Abdomen:  + BS, soft, NT, ND Musculoskeletal:  Residual right sided weakness, RUE > RLE Skin:  Intact   LABS:  CBC  Recent Labs Lab 03/15/14 2101 03/16/14 0332 03/17/14 0703  WBC 10.2 8.9 14.3*  HGB 13.2 12.5* 13.3  HCT 37.5* 35.7* 38.5*  PLT 93* 103* 141*   Coag's  Recent Labs Lab 03/14/14 1130 03/17/14 0703  APTT 35  --   INR 1.20 1.23   BMET  Recent Labs Lab 03/15/14 0532 03/16/14 0332 03/17/14 0703  NA 149* 141 141  K 3.8 3.2* 3.4*  CL 111 106 103  CO2 23 20 17*  BUN 25* 18 11  CREATININE 1.23 0.97 0.82  GLUCOSE 138* 104* 128*   Electrolytes  Recent Labs Lab 03/15/14 0532 03/16/14 0332 03/17/14 0703  CALCIUM 9.1 8.3* 8.9   Sepsis Markers  Recent Labs Lab 03/14/14 1443  LATICACIDVEN 1.72   ABG  Recent Labs Lab 03/14/14 1323  PHART 7.416  PCO2ART 26.1*  PO2ART 62.0*   Cardiac Enzymes  Recent Labs Lab 03/14/14 1130  PROBNP 5474.0*   Glucose  Recent Labs Lab  03/15/14 1510 03/16/14 0730 03/16/14 1151 03/16/14 1734 03/16/14 2342 03/17/14 0757  GLUCAP 103* 107* 131* 120* 116* 130*    Imaging Ir Rande Lawmanhromb F/u Eval Art/ven Final Day (ms)  03/15/2014   CLINICAL DATA:  Pulmonary thromboembolism.  Post 12 hr of lysis.  EXAM: IR THROMB F/U EVAL ART/VEN FINAL DAY  FLUOROSCOPY TIME:  12 seconds.  MEDICATIONS AND MEDICAL HISTORY: None  ANESTHESIA/SEDATION: None  CONTRAST:  None  PROCEDURE: The procedure, risks, benefits, and alternatives were explained to the patient. Questions regarding the procedure were encouraged and answered. The patient understands and consents to the procedure.  Fluoroscopic imaging was performed to confirm stable position of the right and left pulmonary  artery catheters. Pulmonary artery pressures within measured. Pulmonary artery pressures were measured as follows:  Right pulmonary artery pressure:  45/20 with a mean of 28 mm Hg.  Left pulmonary artery pressure:  51/20 with a mean of 30 mm Hg.  The sheaths were removed and hemostasis was achieved with direct pressure and a VPAD device.  FINDINGS: Fluoroscopic imaging confirms stable position of the catheters.  COMPLICATIONS: None  IMPRESSION: There is been a dramatic improvement in the pulmonary artery pressures compared with pre slices measurements. The mean right pulmonary artery pressure has dropped from 47 to 28 mm Hg.   Electronically Signed   By: Maryclare BeanArt  Hoss M.D.   On: 03/15/2014 16:57    ASSESSMENT / PLAN: PULMONARY  A: Acute hypoxic respiratory failure in setting of massive B/L pulmonary emboli following recent hospitalization/rehab for acute CVA.  [place] weaned from 4L to 2L.  PAP is much improved per IR. P:  Wean FiO2 for sat of 88-92%. Will need ambulatory desat study prior to discharge. Heparin IV. Catheter directed tPA completed 04/29, repeat CTA planned for 5/1 and pending. Will need to decide how to treat him chronically > warfarin probably best agent since he has a hx of recent CVA. Would like to start as soon as IR says OK Given improvement in PAP will not recommend filter unless CTA shows significant lack of improvement of PE.  CARDIOVASCULAR  A: Elevated CEs in setting of PE  P:  Telemetry PE treatment as above Hold antihypertensives  Will discuss with IR when OK to restart ASA and Plavix   RENAL  A: Metabolic acidosis + AG, resolving; lactic acid WNL     Hypokalemia P:  Monitor BMP Replace electrolytes as indicated  GASTROINTESTINAL  A:  No acute abnormality  P:  Carb modified diet GI ppx - Protonix  HEMATOLOGIC  A: Mild leukocytosis  P:  Trend fever curve  Trend CBC  INFECTIOUS  A: No acute abnormality P:  Trend fever curve   ENDOCRINE  A:  DM  P:   SSI CBG monitoring  NEUROLOGIC  A: H/o CVA w/ right sided deficits  P:  Resume rehab efforts when hemodynamically stable   Plan of care discussed with patient.  Alyson ReedyWesam G. Yacoub, M.D. [facility]monary/Critical [facility] Medicine. Pager: [phone]. After hours pager: [phone].

## 2014-03-17 NOTE — Progress Notes (Signed)
  Subjective: Saddle PE lysis 4/29 Pt has done quite well breathing well Up in bed, transferred to floor For follow up CTA PE study today  Objective: Vital signs in last 24 hours: Temp:  [98.1 F (36.7 C)-99.9 F (37.7 C)] 98.2 F (36.8 C) (05/01 0531) Pulse Rate:  [68-83] 83 (05/01 0531) Resp:  [20-30] 22 (05/01 0531) BP: (157-187)/(60-87) 179/82 mmHg (05/01 0531) SpO2:  [96 %-100 %] 97 % (05/01 0531) Weight:  [106 kg (233 lb 11 oz)-106.913 kg (235 lb 11.2 oz)] 106 kg (233 lb 11 oz) (05/01 0531)    Intake/Output from previous day: 04/30 0701 - 05/01 0700 In: 1039.5 [I.V.:1039.5] Out: 2350 [Urine:2350] Intake/Output this shift:   PE:  Afeb; vss In NAD Lungs CTA    Lab Results:   Recent Labs  03/16/14 0332 03/17/14 0703  WBC 8.9 14.3*  HGB 12.5* 13.3  HCT 35.7* 38.5*  PLT 103* 141*   BMET  Recent Labs  03/16/14 0332 03/17/14 0703  NA 141 141  K 3.2* 3.4*  CL 106 103  CO2 20 17*  GLUCOSE 104* 128*  BUN 18 11  CREATININE 0.97 0.82  CALCIUM 8.3* 8.9   PT/INR  Recent Labs  03/14/14 1130 03/17/14 0703  LABPROT 14.9 15.2  INR 1.20 1.23   ABG  Recent Labs  03/14/14 1323  PHART 7.416  HCO3 16.8*    Studies/Results: Ir Rande Lawmanhromb F/u Eval Art/ven Final Day (ms)  03/15/2014   CLINICAL DATA:  Pulmonary thromboembolism.  Post 12 hr of lysis.  EXAM: IR THROMB F/U EVAL ART/VEN FINAL DAY  FLUOROSCOPY TIME:  12 seconds.  MEDICATIONS AND MEDICAL HISTORY: None  ANESTHESIA/SEDATION: None  CONTRAST:  None  PROCEDURE: The procedure, risks, benefits, and alternatives were explained to the patient. Questions regarding the procedure were encouraged and answered. The patient understands and consents to the procedure.  Fluoroscopic imaging was performed to confirm stable position of the right and left pulmonary artery catheters. Pulmonary artery pressures within measured. Pulmonary artery pressures were measured as follows:  Right pulmonary artery pressure:  45/20  with a mean of 28 mm Hg.  Left pulmonary artery pressure:  51/20 with a mean of 30 mm Hg.  The sheaths were removed and hemostasis was achieved with direct pressure and a VPAD device.  FINDINGS: Fluoroscopic imaging confirms stable position of the catheters.  COMPLICATIONS: None  IMPRESSION: There is been a dramatic improvement in the pulmonary artery pressures compared with pre slices measurements. The mean right pulmonary artery pressure has dropped from 47 to 28 mm Hg.   Electronically Signed   By: Maryclare BeanArt  Hoss M.D.   On: 03/15/2014 16:57    Anti-infectives: Anti-infectives   None      Assessment/Plan: s/p * No surgery found *  Saddle PE lysis 4/29 Great result- doing well For follow up CTA PE study today If still with significant clot burden will consider IVC filter Pt aware and agreeable   LOS: 3 days    Dakota Hall 03/17/2014

## 2014-03-17 NOTE — Progress Notes (Signed)
ANTICOAGULATION CONSULT NOTE - Follow Up Consult  Pharmacy Consult for Heparin Indication: pulmonary embolus  Allergies  Allergen Reactions  . Penicillins Hives    Patient Measurements: Height: 5\' 8"  (172.7 cm) Weight: 233 lb 11 oz (106 kg) IBW/kg (Calculated) : 68.4 Heparin Dosing Weight: 92 kg  Vital Signs: Temp: 98.1 F (36.7 C) (05/01 1332) Temp src: Oral (05/01 1332) BP: 171/79 mmHg (05/01 1332) Pulse Rate: 75 (05/01 1332)  Labs:  Recent Labs  03/15/14 0532  03/15/14 2101 03/16/14 0332 03/16/14 1000 03/17/14 0703  HGB 14.7  < > 13.2 12.5*  --  13.3  HCT 43.4  < > 37.5* 35.7*  --  38.5*  PLT 117*  < > 93* 103*  --  141*  LABPROT  --   --   --   --   --  15.2  INR  --   --   --   --   --  1.23  HEPARINUNFRC 0.34  < > 0.23* 0.35 0.41  --   CREATININE 1.23  --   --  0.97  --  0.82  < > = values in this interval not displayed.  Estimated Creatinine Clearance: 110.2 ml/min (by C-G formula based on Cr of 0.82).  Assessment:   S/p catheter-directed clot lysis of massive bilateral PE with tPA x 12 hrs on 4/28pm->4.29 am. Also has DVT. IV heparin begun 4/29 ~3pm. Heparin level was at goal on 4/30 am (0.41), but drip held 4/30 pm due to right groin bleed, which took several hours to control.  Patient reports no further bleeding today.  Repeat CTA today showed significantly reduced clot burden, so not expecting IVC.   IV heparin to resume tonight. Recent CVA (March 2015), so using lower end of therapeutic range for heparin levels.  Platelet count has trended up to 141K.  Goal of Therapy:  Heparin level 0.3-0.5 units/ml Monitor platelets by anticoagulation protocol: Yes   Plan:   Resume heparin at slightly lower rate to 1450 units/hr.  Heparin level about 6 hrs after drip resumes.  Daily heparin level and CBC while on heparin.  Will follow up for any further bleeding, and also for oral anticoagulation plans.  Dennie Fettersheresa Donovan Jamarea Selner, [place] Pager: [phone] 03/17/2014,6:36  PM

## 2014-03-17 NOTE — Progress Notes (Signed)
Pts BP still elevated after administering 40mg  of IV hydralizine. [facility] notified, new orders given. Will continue to monitor.  Filed Vitals:   03/17/14 0531  BP: 179/82  Pulse: 83  Temp: 98.2 F (36.8 C)  Resp: 22

## 2014-03-18 LAB — BASIC METABOLIC PANEL
BUN: 14 mg/dL (ref 6–23)
CALCIUM: 9.2 mg/dL (ref 8.4–10.5)
CHLORIDE: 105 meq/L (ref 96–112)
CO2: 17 mEq/L — ABNORMAL LOW (ref 19–32)
CREATININE: 0.79 mg/dL (ref 0.50–1.35)
GFR calc non Af Amer: >90 mL/min (ref >90)
Glucose, Bld: 101 mg/dL — ABNORMAL HIGH (ref 70–99)
Potassium: 3.7 mEq/L (ref 3.7–5.3)
Sodium: 141 mEq/L (ref 137–147)

## 2014-03-18 LAB — HEPARIN LEVEL (UNFRACTIONATED)
HEPARIN UNFRACTIONATED: 0.42 [IU]/mL (ref 0.30–0.70)
Heparin Unfractionated: 0.38 IU/mL (ref 0.30–0.70)

## 2014-03-18 LAB — CBC
HCT: 39.5 % (ref 39.0–52.0)
HEMOGLOBIN: 13.5 g/dL (ref 13.0–17.0)
MCH: 30.5 pg (ref 26.0–34.0)
MCHC: 34.2 g/dL (ref 30.0–36.0)
MCV: 89.4 fL (ref 78.0–100.0)
Platelets: 174 10*3/uL (ref 150–400)
RBC: 4.42 MIL/uL (ref 4.22–5.81)
RDW: 14.1 % (ref 11.5–15.5)
WBC: 11.6 10*3/uL — ABNORMAL HIGH (ref 4.0–10.5)

## 2014-03-18 LAB — GLUCOSE, CAPILLARY
GLUCOSE-CAPILLARY: 143 mg/dL — AB (ref 70–99)
Glucose-Capillary: 129 mg/dL — ABNORMAL HIGH (ref 70–99)
Glucose-Capillary: 153 mg/dL — ABNORMAL HIGH (ref 70–99)
Glucose-Capillary: 127 mg/dL — ABNORMAL HIGH (ref 70–99)

## 2014-03-18 LAB — PHOSPHORUS: Phosphorus: 2.6 mg/dL (ref 2.3–4.6)

## 2014-03-18 LAB — MAGNESIUM: MAGNESIUM: 1.9 mg/dL (ref 1.5–2.5)

## 2014-03-18 LAB — PROTIME-INR
INR: 1.3 (ref 0.00–1.49)
Prothrombin Time: 15.9 seconds — ABNORMAL HIGH (ref 11.6–15.2)

## 2014-03-18 NOTE — Progress Notes (Signed)
ANTICOAGULATION CONSULT NOTE - Follow Up Consult  Pharmacy Consult for Heparin  Indication: pulmonary embolus  Allergies  Allergen Reactions  . Penicillins Hives    Patient Measurements: Height: 5\' 8"  (172.7 cm) Weight: 225 lb 9.6 oz (102.331 kg) IBW/kg (Calculated) : 68.4 Heparin Dosing Weight: 92 kg  Vital Signs: Temp: 97.5 F (36.4 C) (05/02 1441) Temp src: Oral (05/02 1441) BP: 173/76 mmHg (05/02 1441) Pulse Rate: 75 (05/02 1441)  Labs:  Recent Labs  03/16/14 0332 03/16/14 1000 03/17/14 0703 03/18/14 0043 03/18/14 0557 03/18/14 1424  HGB 12.5*  --  13.3 13.5  --   --   HCT 35.7*  --  38.5* 39.5  --   --   PLT 103*  --  141* 174  --   --   LABPROT  --   --  15.2 15.9*  --   --   INR  --   --  1.23 1.30  --   --   HEPARINUNFRC 0.35 0.41  --   --  0.38 0.42  CREATININE 0.97  --  0.82 0.79  --   --     Estimated Creatinine Clearance: 111 ml/min (by C-G formula based on Cr of 0.79).   Medications:  Heparin 1450 units/hr  Assessment: 63 y/o M on heparin for PE and was restarted s/p CTA on 03/17/14 ( Clot burden significantly reduced). Heparin is confirmed at goal (HL= 0.42) at 1450 units/hr. Noted that pm of 5/1- R groin hematoma with no changes in heparin plans.   Goal of Therapy:  Heparin level 0.3-0.5 units/ml Monitor platelets by anticoagulation protocol: Yes   Plan:  -Continue heparin at 1450 units/hr -Daily CBC/HL -Monitor for bleeding -Per notes, to resume coumadin when ok with IR  Harland GermanAndrew Havana Baldwin, Pharm D 03/18/2014 3:06 PM

## 2014-03-18 NOTE — Progress Notes (Signed)
ANTICOAGULATION CONSULT NOTE - Follow Up Consult  Pharmacy Consult for Heparin  Indication: pulmonary embolus  Allergies  Allergen Reactions  . Penicillins Hives    Patient Measurements: Height: 5\' 8"  (172.7 cm) Weight: 225 lb 9.6 oz (102.331 kg) IBW/kg (Calculated) : 68.4 Heparin Dosing Weight: 92 kg  Vital Signs: Temp: 97.6 F (36.4 C) (05/02 0554) Temp src: Oral (05/02 0554) BP: 179/93 mmHg (05/02 0554) Pulse Rate: 67 (05/02 0554)  Labs:  Recent Labs  03/16/14 0332 03/16/14 1000 03/17/14 0703 03/18/14 0043 03/18/14 0557  HGB 12.5*  --  13.3 13.5  --   HCT 35.7*  --  38.5* 39.5  --   PLT 103*  --  141* 174  --   LABPROT  --   --  15.2 15.9*  --   INR  --   --  1.23 1.30  --   HEPARINUNFRC 0.35 0.41  --   --  0.38  CREATININE 0.97  --  0.82 0.79  --     Estimated Creatinine Clearance: 111 ml/min (by C-G formula based on Cr of 0.79).   Medications:  Heparin 1450 units/hr  Assessment: 63 y/o M on heparin for PE, re-started last night after holding, first HL after re-start is 0.38, other labs as above.   Goal of Therapy:  Heparin level 0.3-0.5 units/ml Monitor platelets by anticoagulation protocol: Yes   Plan:  -Continue heparin at 1450 units/hr -Confirmatory HL at 1200 -Daily CBC/HL -Monitor for bleeding  Abran DukeJames Lavena Loretto 03/18/2014,7:02 AM

## 2014-03-18 NOTE — Progress Notes (Addendum)
PULMONARY / CRITICAL CARE MEDICINE   Name: Dakota Hall MRN: [id number] DOB: Aug 17, 1951    ADMISSION DATE:  03/14/2014 CONSULTATION DATE:  03/14/2014  REFERRING MD : EDP  PRIMARY SERVICE: Pulmonary   CHIEF COMPLAINT: Acute resp failure   BRIEF PATIENT DESCRIPTION:  63 year old male, residing at S/p recent CVA w/ right sided motor deficits. Began to c/o CP and worsening dyspnea first noted on 4/27 during PT. O2 sats recorded in 70% range on room air. Sent to ER 4/28 for evaluation. Dx eval showed massive saddle embolus w/ evidence or right heart strain. PCCM asked to admit.   SIGNIFICANT EVENTS / STUDIES:  CT chest 4/28: Massive bilateral pulmonary emboli with saddle embolus and evidence of right heart strain/failure.  ECHO 4/28 >>> EF 55-60%, RV hemodynamic compromise from probable large saddle pulmonary embolus.  There is thrombus in transit in the RA.   LE Doppler US 4/28 >>> acute DVT throughout popliteal vein; age indeterminate DVT throughout posterior tibial vein; acute SVT involving proximal segment of lesser saphenous vein. CT chest 05/1>>>Significantly decreased pulmonary embolic burden with significant  reduction in right heart strain and near complete normalization of the RV/LV ratio   LINES / TUBES:  04/28 B/L PA infusion catheters> removed 4/30  CULTURES:  Blood cultures x 2  04/28>>>  ANTIBIOTICS: None   INTERVAL HISTORY:    nad RA   VITAL SIGNS: Temp:  [97.5 F (36.4 C)-99 F (37.2 C)] 97.5 F (36.4 C) (05/02 1441) Pulse Rate:  [67-75] 75 (05/02 1441) Resp:  [20] 20 (05/02 1441) BP: (172-179)/(76-93) 173/76 mmHg (05/02 1441) SpO2:  [94 %-96 %] 96 % (05/02 1441) Weight:  [225 lb 9.6 oz (102.331 kg)] 225 lb 9.6 oz (102.331 kg) (05/02 0554)  HEMODYNAMICS:   VENTILATOR SETTINGS:  N/A  INTAKE / OUTPUT: Intake/Output     05/01 0701 - 05/02 0700 05/02 0701 - 05/03 0700   P.O. 0 480   I.V. (mL/kg) 1200 (11.7) 1350 (13.2)   Total Intake(mL/kg) 1200 (11.7)  1830 (17.9)   Urine (mL/kg/hr) 1800 (0.7)    Total Output 1800     Net -600 +1830        Urine Occurrence 1 x    Stool Occurrence 1 x 1 x    PHYSICAL EXAMINATION: General:  In NAD Neuro:  AAO x 3, responding appropriately, CN2-12 grossly intact HEENT:  WNL Cardiovascular:  RRR Lungs:  Decreased BS at the bases anteriorly, otherwise clear Abdomen:  + BS, soft, NT, ND Musculoskeletal:  Residual right sided weakness, RUE > RLE Skin:  Intact   LABS:  CBC  Recent Labs Lab 03/16/14 0332 03/17/14 0703 03/18/14 0043  WBC 8.9 14.3* 11.6*  HGB 12.5* 13.3 13.5  HCT 35.7* 38.5* 39.5  PLT 103* 141* 174   Coag's  Recent Labs Lab 03/14/14 1130 03/17/14 0703 03/18/14 0043  APTT 35  --   --   INR 1.20 1.23 1.30   BMET  Recent Labs Lab 03/16/14 0332 03/17/14 0703 03/18/14 0043  NA 141 141 141  K 3.2* 3.4* 3.7  CL 106 103 105  CO2 20 17* 17*  BUN 18 11 14   CREATININE 0.97 0.82 0.79  GLUCOSE 104* 128* 101*   Electrolytes  Recent Labs Lab 03/16/14 0332 03/17/14 0703 03/18/14 0043  CALCIUM 8.3* 8.9 9.2  MG  --   --  1.9  PHOS  --   --  2.6   Sepsis Markers  Recent Labs Lab 03/14/14 1443  LATICACIDVEN 1.72   ABG  Recent Labs Lab 03/14/14 1323  PHART 7.416  PCO2ART 26.1*  PO2ART 62.0*   Cardiac Enzymes  Recent Labs Lab 03/14/14 1130  PROBNP 5474.0*   Glucose  Recent Labs Lab 03/17/14 0757 03/17/14 1207 03/17/14 1715 03/17/14 2138 03/18/14 0754 03/18/14 1126  GLUCAP 130* 123* 92 135* 129* 153*    Imaging Ct Angio Chest Pe W/cm &/or Wo Cm  03/17/2014   CLINICAL DATA:  63 year old male with sub massive PE diagnosed on 03/14/2014 now 48 hr status post catheter directed ultrasound assisted thrombolysis (USAT). Re-evaluate RV/LV ratio and clot burden.  EXAM: CT ANGIOGRAPHY CHEST WITH CONTRAST  TECHNIQUE: Multidetector CT imaging of the chest was performed using the standard protocol during bolus administration of intravenous contrast.  Multiplanar CT image reconstructions and MIPs were obtained to evaluate the vascular anatomy.  CONTRAST:  100mL OMNIPAQUE IOHEXOL 350 MG/ML SOLN  COMPARISON:  Prior CT PE study 03/14/2014  FINDINGS: Mediastinum: Unremarkable CT appearance of the thyroid gland. No suspicious mediastinal or hilar adenopathy. No soft tissue mediastinal mass. The thoracic esophagus is unremarkable.  Borderline enlarged right paratracheal lymph node measures 9 mm in short axis.  Heart/Vascular: Adequate opacification of the main pulmonary arteries to the proximal segmental level. There has been a significant interval decrease in the overall clot burden over the past 48 hr. A saddle embolus is now resolved. There is some residual nonocclusive thrombus in the left lower lobe pulmonary artery and left upper lobe pulmonary arteries extending into segmental branches. On the right, there is persistent thrombus within segmental branches of the right upper lobe pulmonary artery and with in the right lower lobe pulmonary artery extending into segmental and subsegmental branches. Again, the clot burden overall is improved. Significant reduction in the RV to LV ratio which measures 1.04 today compared to 3.15. Persistent mild cardiomegaly. No pericardial effusion.  Lungs/Pleura: No pleural effusion. Mild dependent atelectasis. Patchy airspace opacity in the inferior and medial right upper lobe may represent atelectasis or very small amounts of alveolar hemorrhage.  Bones/Soft Tissues: No acute fracture or aggressive appearing lytic or blastic osseous lesion.  Upper Abdomen: Unremarkable imaged upper abdomen.  Review of the MIP images confirms the above findings.  IMPRESSION: Significantly decreased pulmonary embolic burden with significant reduction in right heart strain and near complete normalization of the RV/LV ratio. The RV/LV ratio today measures 1.04 compared to 3.15 previously (less than 0.9 is considered normal).   Electronically Signed    By: Malachy MoanHeath  McCullough M.D.   On: 03/17/2014 14:08    ASSESSMENT / PLAN: PULMONARY  A: Acute hypoxic respiratory failure in setting of massive B/L pulmonary emboli following recent hospitalization/rehab for acute CVA.  [place] weaned  To RA  PAP is much improved per IR. P: . Heparin IV. Catheter directed tPA completed 04/29  Will need to decide how to treat him chronically > warfarin probably best agent since he has a hx of recent CVA. Would like to start as soon as IR says OK  ss    CARDIOVASCULAR  A: Elevated CEs in setting of PE  P:  Telemetry PE treatment as above Continue home  antihypertensives  Will discuss with IR when OK to restart ASA and Plavix   RENAL  A: Metabolic acidosis + AG, resolving; lactic acid WNL     Hypokalemia P:  Monitor BMP Replace electrolytes as indicated  GASTROINTESTINAL  A:  No acute abnormality  P:  Carb modified diet GI ppx - Protonix  HEMATOLOGIC  A: Mild leukocytosis  P:  Trend fever curve  Trend CBC  INFECTIOUS  A: No acute abnormality P:  Trend fever curve   ENDOCRINE  A:  DM  P:  SSI CBG monitoring  NEUROLOGIC  A: H/o CVA w/ right sided deficits  P:  Resume rehab efforts when hemodynamically stable    Sandrea HughsMichael Wert, MD Pulmonary and Critical Care Medicine  Healthcare Cell [phone] After 5:30 PM or weekends, call [phone]

## 2014-03-19 LAB — CBC
HCT: 39.8 % (ref 39.0–52.0)
Hemoglobin: 14.1 g/dL (ref 13.0–17.0)
MCH: 30.3 pg (ref 26.0–34.0)
MCHC: 35.4 g/dL (ref 30.0–36.0)
MCV: 85.4 fL (ref 78.0–100.0)
PLATELETS: 201 10*3/uL (ref 150–400)
RBC: 4.66 MIL/uL (ref 4.22–5.81)
RDW: 13.1 % (ref 11.5–15.5)
WBC: 10.6 10*3/uL — ABNORMAL HIGH (ref 4.0–10.5)

## 2014-03-19 LAB — PROTIME-INR
INR: 1.19 (ref 0.00–1.49)
PROTHROMBIN TIME: 14.8 s (ref 11.6–15.2)

## 2014-03-19 LAB — HEPARIN LEVEL (UNFRACTIONATED): Heparin Unfractionated: 0.37 IU/mL (ref 0.30–0.70)

## 2014-03-19 LAB — GLUCOSE, CAPILLARY
Glucose-Capillary: 126 mg/dL — ABNORMAL HIGH (ref 70–99)
Glucose-Capillary: 152 mg/dL — ABNORMAL HIGH (ref 70–99)
Glucose-Capillary: 133 mg/dL — ABNORMAL HIGH (ref 70–99)
Glucose-Capillary: 167 mg/dL — ABNORMAL HIGH (ref 70–99)

## 2014-03-19 MED ORDER — WARFARIN VIDEO
Freq: Once | Status: AC
  Administered 2014-03-19: 15:00:00

## 2014-03-19 MED ORDER — WARFARIN SODIUM 7.5 MG PO TABS
7.5000 mg | ORAL_TABLET | Freq: Once | ORAL | Status: AC
  Administered 2014-03-19: 7.5 mg via ORAL
  Filled 2014-03-19: qty 1

## 2014-03-19 MED ORDER — BISOPROLOL FUMARATE 5 MG PO TABS
5.0000 mg | ORAL_TABLET | Freq: Every day | ORAL | Status: DC
  Administered 2014-03-19 – 2014-03-22 (×4): 5 mg via ORAL
  Filled 2014-03-19 (×4): qty 1

## 2014-03-19 MED ORDER — COUMADIN BOOK
Freq: Once | Status: AC
  Administered 2014-03-19: 14:00:00
  Filled 2014-03-19: qty 1

## 2014-03-19 MED ORDER — WARFARIN - PHARMACIST DOSING INPATIENT
Freq: Every day | Status: DC
  Administered 2014-03-19: 19:00:00

## 2014-03-19 NOTE — Progress Notes (Signed)
ANTICOAGULATION CONSULT NOTE - Follow Up Consult  Pharmacy Consult for Heparin, coumadin  Indication: pulmonary embolus  Allergies  Allergen Reactions  . Penicillins Hives    Patient Measurements: Height: 5\' 8"  (172.7 cm) Weight: 225 lb 11.2 oz (102.377 kg) IBW/kg (Calculated) : 68.4 Heparin Dosing Weight: 92 kg  Vital Signs: Temp: 98.1 F (36.7 C) (05/03 0638) Temp src: Oral (05/03 16100638) BP: 175/81 mmHg (05/03 0704) Pulse Rate: 93 (05/03 0638)  Labs:  Recent Labs  03/17/14 0703 03/18/14 0043 03/18/14 0557 03/18/14 1424 03/19/14 0630  HGB 13.3 13.5  --   --  14.1  HCT 38.5* 39.5  --   --  39.8  PLT 141* 174  --   --  201  LABPROT 15.2 15.9*  --   --  14.8  INR 1.23 1.30  --   --  1.19  HEPARINUNFRC  --   --  0.38 0.42 0.37  CREATININE 0.82 0.79  --   --   --     Estimated Creatinine Clearance: 111 ml/min (by C-G formula based on Cr of 0.79).   Medications:  Heparin 1450 units/hr  Assessment: 63 y/o M on heparin for PE and was restarted s/p CTA on 03/17/14 ( Clot burden significantly reduced). Heparin isat goal (HL= 0.37) at 1450 units/hr.  Coumadin to resume today (INR= 1.19) and today is day 1 of overlap with heparin.  Goal of Therapy:  Heparin level 0.3-0.5 units/ml Monitor platelets by anticoagulation protocol: Yes   Plan:  -Continue heparin at 1450 units/hr -Daily CBC/HL -Monitor for bleeding -Coumadin 7.5mg  po today  Harland GermanAndrew Dannelle Rhymes, Pharm D 03/19/2014 1:36 PM

## 2014-03-19 NOTE — Progress Notes (Signed)
PULMONARY / CRITICAL CARE MEDICINE   Name: Dakota Hall MRN: [id number] DOB: 12/25/1950    ADMISSION DATE:  03/14/2014 CONSULTATION DATE:  03/14/2014  REFERRING MD : EDP  PRIMARY SERVICE: Pulmonary   CHIEF COMPLAINT: Acute resp failure   BRIEF PATIENT DESCRIPTION:  63 year old male, residing at S/p recent CVA w/ right sided motor deficits. Began to c/o CP and worsening dyspnea first noted on 4/27 during PT. O2 sats recorded in 70% range on room air. Sent to ER 4/28 for evaluation. Dx eval showed massive saddle embolus w/ evidence or right heart strain. PCCM asked to admit.   SIGNIFICANT EVENTS / STUDIES:  CT chest 4/28: Massive bilateral pulmonary emboli with saddle embolus and evidence of right heart strain/failure.  ECHO 4/28 >>> EF 55-60%, RV hemodynamic compromise from probable large saddle pulmonary embolus.  There is thrombus in transit in the RA.   LE Doppler US 4/28 >>> acute DVT throughout popliteal vein; age indeterminate DVT throughout posterior tibial vein; acute SVT involving proximal segment of lesser saphenous vein. CT chest 05/1>>>Significantly decreased pulmonary embolic burden with significant  reduction in right heart strain and near complete normalization of the RV/LV ratio   LINES / TUBES:  04/28 B/L PA infusion catheters> removed 4/30  CULTURES:  Blood cultures x 2  04/28>>>  ANTIBIOTICS: None   INTERVAL HISTORY:    nad RA   VITAL SIGNS: Temp:  [97.5 F (36.4 C)-98.4 F (36.9 C)] 98.1 F (36.7 C) (05/03 13080638) Pulse Rate:  [68-93] 93 (05/03 0638) Resp:  [18-20] 18 (05/03 [id number]) BP: (161-197)/(76-83) 175/81 mmHg (05/03 0704) SpO2:  [93 %-96 %] 94 % (05/03 46960638) Weight:  [225 lb 11.2 oz (102.377 kg)] 225 lb 11.2 oz (102.377 kg) (05/03 [id number])  HEMODYNAMICS:   VENTILATOR SETTINGS:  N/A  INTAKE / OUTPUT: Intake/Output     05/02 0701 - 05/03 0700 05/03 0701 - 05/04 0700   P.O. 720 120   I.V. (mL/kg) 2298.3 (22.4) 2385.8 (23.3)   Total Intake(mL/kg)  3018.3 (29.5) 2505.8 (24.5)   Urine (mL/kg/hr) 1150 (0.5) 700 (1.1)   Total Output 1150 700   Net +1868.3 +1805.8        Stool Occurrence 1 x     PHYSICAL EXAMINATION: General:  In NAD Neuro:  AAO x 3, responding appropriately  HEENT:  WNL Cardiovascular:  RRR Lungs:  Decreased BS at the bases anteriorly, otherwise clear Abdomen:  + BS, soft, NT, ND Musculoskeletal:  Residual right sided weakness, RUE > RLE Skin:  Intact   LABS:  CBC  Recent Labs Lab 03/17/14 0703 03/18/14 0043 03/19/14 0630  WBC 14.3* 11.6* 10.6*  HGB 13.3 13.5 14.1  HCT 38.5* 39.5 39.8  PLT 141* 174 201   Coag's  Recent Labs Lab 03/14/14 1130 03/17/14 0703 03/18/14 0043 03/19/14 0630  APTT 35  --   --   --   INR 1.20 1.23 1.30 1.19   BMET  Recent Labs Lab 03/16/14 0332 03/17/14 0703 03/18/14 0043  NA 141 141 141  K 3.2* 3.4* 3.7  CL 106 103 105  CO2 20 17* 17*  BUN 18 11 14   CREATININE 0.97 0.82 0.79  GLUCOSE 104* 128* 101*   Electrolytes  Recent Labs Lab 03/16/14 0332 03/17/14 0703 03/18/14 0043  CALCIUM 8.3* 8.9 9.2  MG  --   --  1.9  PHOS  --   --  2.6   Sepsis Markers  Recent Labs Lab 03/14/14 1443  LATICACIDVEN 1.72  ABG  Recent Labs Lab 03/14/14 1323  PHART 7.416  PCO2ART 26.1*  PO2ART 62.0*   Cardiac Enzymes  Recent Labs Lab 03/14/14 1130  PROBNP 5474.0*   Glucose  Recent Labs Lab 03/18/14 0754 03/18/14 1126 03/18/14 1643 03/18/14 2239 03/19/14 0724 03/19/14 1143  GLUCAP 129* 153* 127* 143* 126* 152*    Imaging No results found.  ASSESSMENT / PLAN: PULMONARY  A: Acute hypoxic respiratory failure in setting of massive B/L pulmonary emboli following recent hospitalization/rehab for acute CVA.  [place] weaned  To RA  PAP is much improved per IR. P: . Heparin IV. Catheter directed tPA completed 04/29  Ok to start coumadin 5/3  CARDIOVASCULAR  A: Elevated CEs in setting of PE  P:  Telemetry PE treated as above Continue home   antihypertensives  Need to decide when/if  safe restart ASA and Plavix   RENAL  A: Metabolic acidosis + AG, resolving; lactic acid WNL     Hypokalemia P:  Monitor BMP Replace electrolytes as indicated  GASTROINTESTINAL  A:  No acute abnormality  P:  Carb modified diet GI ppx - Protonix  HEMATOLOGIC  A: Mild leukocytosis  P:  Trend fever curve  Trend CBC  INFECTIOUS  A: No acute abnormality P:  Trend fever curve   ENDOCRINE  A:  DM  P:  SSI CBG monitoring  NEUROLOGIC  A: H/o CVA w/ right sided deficits  P:  Resume rehab efforts when hemodynamically stable     Dakota HughsMichael Icesis Renn, MD Pulmonary and Critical Care Medicine [facility] Cell [phone] After 5:30 PM or weekends, call [phone]

## 2014-03-19 NOTE — Progress Notes (Signed)
Patient set himself up on CPAP for te night. No issues and tolerating well when checked on. Will continue to assist if needed.

## 2014-03-20 DIAGNOSIS — E669 Obesity, unspecified: Secondary | ICD-10-CM

## 2014-03-20 DIAGNOSIS — I82409 Acute embolism and thrombosis of unspecified deep veins of unspecified lower extremity: Secondary | ICD-10-CM

## 2014-03-20 DIAGNOSIS — E785 Hyperlipidemia, unspecified: Secondary | ICD-10-CM

## 2014-03-20 DIAGNOSIS — G4730 Sleep apnea, unspecified: Secondary | ICD-10-CM

## 2014-03-20 DIAGNOSIS — Z8673 Personal history of transient ischemic attack (TIA), and cerebral infarction without residual deficits: Secondary | ICD-10-CM

## 2014-03-20 LAB — CBC
HCT: 40 % (ref 39.0–52.0)
Hemoglobin: 14 g/dL (ref 13.0–17.0)
MCH: 30 pg (ref 26.0–34.0)
MCHC: 35 g/dL (ref 30.0–36.0)
MCV: 85.8 fL (ref 78.0–100.0)
PLATELETS: 255 10*3/uL (ref 150–400)
RBC: 4.66 MIL/uL (ref 4.22–5.81)
RDW: 13.3 % (ref 11.5–15.5)
WBC: 11.1 10*3/uL — ABNORMAL HIGH (ref 4.0–10.5)

## 2014-03-20 LAB — FACTOR 5 LEIDEN

## 2014-03-20 LAB — PROTHROMBIN GENE MUTATION

## 2014-03-20 LAB — CULTURE, BLOOD (ROUTINE X 2)
Culture: NO GROWTH
Culture: NO GROWTH

## 2014-03-20 LAB — GLUCOSE, CAPILLARY
GLUCOSE-CAPILLARY: 141 mg/dL — AB (ref 70–99)
Glucose-Capillary: 177 mg/dL — ABNORMAL HIGH (ref 70–99)
Glucose-Capillary: 124 mg/dL — ABNORMAL HIGH (ref 70–99)
Glucose-Capillary: 129 mg/dL — ABNORMAL HIGH (ref 70–99)

## 2014-03-20 LAB — PROTIME-INR
INR: 1.16 (ref 0.00–1.49)
Prothrombin Time: 14.6 seconds (ref 11.6–15.2)

## 2014-03-20 LAB — HEPARIN LEVEL (UNFRACTIONATED): Heparin Unfractionated: 0.45 IU/mL (ref 0.30–0.70)

## 2014-03-20 MED ORDER — WARFARIN SODIUM 7.5 MG PO TABS
7.5000 mg | ORAL_TABLET | Freq: Once | ORAL | Status: AC
  Administered 2014-03-20: 7.5 mg via ORAL
  Filled 2014-03-20: qty 1

## 2014-03-20 NOTE — Progress Notes (Addendum)
PULMONARY / CRITICAL CARE MEDICINE   Name: Dionne AnoRoger W Seiler MRN: [id number] DOB: 12/04/1950    ADMISSION DATE:  03/14/2014 CONSULTATION DATE:  03/14/2014  REFERRING MD : EDP  PRIMARY SERVICE: Pulmonary   CHIEF COMPLAINT: Acute resp failure   BRIEF PATIENT DESCRIPTION:  63 year old male, residing at S/p recent CVA w/ right sided motor deficits. Began to c/o CP and worsening dyspnea first noted on 4/27 during PT. O2 sats recorded in 70% range on room air. Sent to ER 4/28 for evaluation. Dx eval showed massive saddle embolus w/ evidence or right heart strain. PCCM asked to admit.   SIGNIFICANT EVENTS / STUDIES:  CT chest 4/28: Massive bilateral pulmonary emboli with saddle embolus and evidence of right heart strain/failure.  ECHO 4/28 >>> EF 55-60%, RV hemodynamic compromise from probable large saddle pulmonary embolus.  There is thrombus in transit in the RA.   LE Doppler US 4/28 >>> acute DVT throughout popliteal vein; age indeterminate DVT throughout posterior tibial vein; acute SVT involving proximal segment of lesser saphenous vein. CT chest 05/1>>>Significantly decreased pulmonary embolic burden with significant  reduction in right heart strain and near complete normalization of the RV/LV ratio   LINES / TUBES:  04/28 B/L PA infusion catheters> removed 4/30  CULTURES:  Blood cultures x 2  04/28 > NEG  ANTIBIOTICS: None   INTERVAL HISTORY:    nad RA, anxious to be discharged from hospital  VITAL SIGNS: Temp:  [97.4 F (36.3 C)-98.3 F (36.8 C)] 98.3 F (36.8 C) (05/04 0534) Pulse Rate:  [57-75] 59 (05/04 1012) Resp:  [18-20] 18 (05/04 0534) BP: (145-180)/(67-92) 156/72 mmHg (05/04 1012) SpO2:  [95 %-96 %] 95 % (05/04 0534) Weight:  [101.016 kg (222 lb 11.2 oz)] 101.016 kg (222 lb 11.2 oz) (05/04 0534)  HEMODYNAMICS:   VENTILATOR SETTINGS:  N/A  INTAKE / OUTPUT: Intake/Output     05/03 0701 - 05/04 0700 05/04 0701 - 05/05 0700   P.O. 480 240   I.V. (mL/kg) 2516.8  (24.9)    Total Intake(mL/kg) 2996.8 (29.7) 240 (2.4)   Urine (mL/kg/hr) 1500 (0.6) 400 (1.1)   Total Output 1500 400   Net +1496.8 -160        Stool Occurrence 1 x     PHYSICAL EXAMINATION: General:  In NAD Neuro:  AAO x 3, responding appropriately  HEENT:  NCAT, no JVD noted.  Cardiovascular:  RRR Lungs:  Clear, no w/r/r. Even, unlabored Abdomen:  + BS, soft, NT, ND Musculoskeletal:  Residual right sided weakness, RUE > RLE Skin:  Intact   LABS:  CBC  Recent Labs Lab 03/18/14 0043 03/19/14 0630 03/20/14 0450  WBC 11.6* 10.6* 11.1*  HGB 13.5 14.1 14.0  HCT 39.5 39.8 40.0  PLT 174 201 255   Coag's  Recent Labs Lab 03/14/14 1130  03/18/14 0043 03/19/14 0630 03/20/14 0450  APTT 35  --   --   --   --   INR 1.20  < > 1.30 1.19 1.16  < > = values in this interval not displayed. BMET  Recent Labs Lab 03/16/14 0332 03/17/14 0703 03/18/14 0043  NA 141 141 141  K 3.2* 3.4* 3.7  CL 106 103 105  CO2 20 17* 17*  BUN 18 11 14   CREATININE 0.97 0.82 0.79  GLUCOSE 104* 128* 101*   Electrolytes  Recent Labs Lab 03/16/14 0332 03/17/14 0703 03/18/14 0043  CALCIUM 8.3* 8.9 9.2  MG  --   --  1.9  PHOS  --   --  2.6   Sepsis Markers  Recent Labs Lab 03/14/14 1443  LATICACIDVEN 1.72   ABG  Recent Labs Lab 03/14/14 1323  PHART 7.416  PCO2ART 26.1*  PO2ART 62.0*   Cardiac Enzymes  Recent Labs Lab 03/14/14 1130  PROBNP 5474.0*   Glucose  Recent Labs Lab 03/18/14 2239 03/19/14 0724 03/19/14 1143 03/19/14 1726 03/19/14 2141 03/20/14 0743  GLUCAP 143* 126* 152* 133* 167* 141*    Imaging No results found.  ASSESSMENT / PLAN:  Acute hypoxic respiratory failure in setting of massive B/L pulmonary emboli following recent hospitalization/rehab for acute CVA.  [place] weaned  To RA  PAP is much improved per IR.  -Catheter directed tPA completed 04/29  -Heparin IV bridge -Coumadin started 5/3 per pharmacy   Elevated CEs in setting of PE  decreased clot burden and RV strain nearly resolved by CTA 5/1. HTN -Telemetry -PE treated as above -Continue home antihypertensives  -Need to decide when/if safe restart ASA and Plavix   Metabolic acidosis + AG, resolving; lactic acid WNL Hypokalemia -Monitor BMP -Replace electrolytes as indicated  H/o GERD -GI ppx - Protonix  Mild leukocytosis  -Trend fever curve  -Trend CBC  DM  -SSI -CBG monitoring -Carb modified diet  H/o CVA w/ right sided deficits  -Resume rehab efforts when hemodynamically stable    STAFF NOTE: I, Dr Lavinia SharpsM Gary Bultman have personally reviewed patient's available data, including medical history, events of note, physical examination and test results as part of my evaluation. I have discussed with resident/NP and other care providers such as pharmacist, RN and RRT.  In addition,  I personally evaluated patient and elicited key findings of - STaff Note (patient  Is s/p systemic TPA 02/03/14 for small vessel ischemic stroke which he got when on baby aspirin. Discharged end may 2015 to rehab on plavix (aspirin stopped). No sumbassive PE with DVT needing local tPA. curently on coumadin. Have requested Dr Cyndie ChimeGranfortuna to opine on coumadin v NOAC (?lowr risk of bleed in brain in A fib studies), ? Adding aspirin or plavix back and ? Duration of anti-coagulation, Patient agreeable with plan .  Rest per NP/medical resident whose note is outlined above and that I agree with   Dr. Kalman ShanMurali Irbin Fines, M.D., [facility] IncF.C.C.P Pulmonary and Critical Care Medicine Staff Physician [facility] Pager: [phone], If no answer or between  15:00h - 7:00h: call [phone]  03/20/2014 12:20 PM

## 2014-03-20 NOTE — Consult Note (Signed)
Referring MD: Dr. Beaulah DinningMuralli Ramaswamy  PCP:  [name],[name], MD   Reason for Referral: Advice on anticoagulation in a complex patient who just suffered a massive pulmonary embolus 1 month after an ischemic stroke   Chief Complaint  Patient presents with  . Shortness of Breath    HPI:  Hematology evaluation for this 63 year old man who has been hypertensive for about 20 years, diabetic for 5 years on oral agents, hyperlipidemic, has sleep apnea on CPAP for about 15 years. He initially presented in June of 2013 with sudden onset of dizziness, diplopia, and slurred speech. No abnormalities on CT scan of the brain except for some mild small vessel disease and generalized atrophy. MRI showed multifocal strokes involving the pons and right parietal lobes. These were felt to be ischemic and not embolic in nature. He was treated with aspirin 325 mg daily. He did well until March 20 when he developed sudden onset of right-sided weakness and dysarthria while at home. He called 911. He was admitted to the hospital with a dense right hemiplegia. No gross abnormalities on initial CT but followup study showed a 2.5 cm area of acute infarction in the left basal ganglia. He did present in time to be a candidate for thrombolytic therapy and received TPA. Venous Dopplers of the lower extremity showed deep venous thrombosis involving the right popliteal, right posterior tibial, and right peroneal veins as well as the proximal lesser saphenous vein.    An extensive workup ensued including MRA of the brain, carotid Dopplers, and echocardiogram:  CT of the brain 02/03/2014 Atrophy with extensive supratentorial small vessel disease as well as prior infarct in the left pons. No acute appearing infarct seen. No hemorrhage or mass effect. Major intracranial vessels shows some increased attenuation, stable from prior study. The significance of this finding is questionable.  CT Angio Head and Neck 02/03/2014 1.  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 No arterial stenosis in the neck. No major circle of Willis branch occlusion. 2. Mild irregularity of the left PCA P1 segment. Consider left thalamostriate type small vessel ischemia in this setting. 3. Stable CT appearance of the brain. No evidence of cortically based acute infarction identified. Salient findings on this study reviewed in person with  MRI of the brain 02/04/2014 2 x 2.5 cm region of acute infarction affecting the left basal ganglia and radiating white matter tracts. Mild swelling but no hemorrhage or mass effect. Extensive chronic small vessel disease throughout the cerebral hemispheric white matter. 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  MRA of the brain 02/04/2014 Negative intracranial MR angiography of the large and medium size vessels.  2D Echocardiogram EF 55-60% with no source of embolus.  Carotid Doppler No evidence of hemodynamically significant internal carotid artery stenosis. Vertebral artery flow is antegrade.  CXR 02/03/2014 No interval change. Cardiomegaly and left lower lobe atelectasis.  EKG normal EKG, normal sinus rhythm, unchanged from previous tracings.     Plavix was added to the aspirin.   When he was otherwise stable, he was transferred to inpatient rehabilitation on March 25 and was on the rehabilitation service for 23 days until April 17 and then was discharged to an extended care facility for further outpatient rehabilitation. He received prophylactic Lovenox while on the inpatient service but this was discontinued at the outpatient facility. He began to develop progressive dyspnea on March 27 without any chest pain, palpitations, cough, or hemoptysis. He was readmitted to the hospital on April 28. CT angiogram showed massive pulmonary embolus including a  saddle embolus. CT of the brain with contrast did not show any hemorrhagic transformation of the recent cerebral infarct and he was felt to be a candidate for  additional thrombolytic therapy which was given on April 29 with excellent results. Followup CT angiographic chest shows near complete resolution of his clot burden. He was started on unfractionated heparin which he continues. He was started on warfarin 7.5 mg daily started on May 3.  Clotting studies done so far show admission pro time 14.9 seconds INR 1.2, PTT 35, hemoglobin of 16.7, initial white count 16,500 and platelet count 139,000 compare with a platelet count of 194,000 recorded on March 26. Platelet count fell as low as 93,000 by April 29 but has recovered to current value of 255,000 despite continued use of heparin. He tested negative for the presence of the prothrombin gene mutation and the factor V Leiden gene mutation, normal antithrombin level 80% of control (70-120), normal plasma homocysteine 13.5 units, negative for the presence of anticardiolipin antibodies and negative for presence of antibodies to beta 2 glycoprotein 1.  lupus-type anticoagulant is positive but spurious since it was done while he was on heparin which is invalididates  this PTT-based test.  Other than the stroke, he has no prior clotting history in the personal or family. His father died at age 32 of a heart attack. Mother died in her 92s of non-Hodgkin's lymphoma. Brother and sister are healthy. 2 children age 29, male at age 41, male,  Healthy.  He is a nonsmoker. No alcohol. No recreational drugs.        Past Medical History  Diagnosis Date  . Diabetes mellitus   . Hypertension   . Stroke July 2013    left paramedian pontine, incidental right parietal subcortical. both d/t small vessel disease  . Stroke March 2015    left basal ganglia secondary to small vessel disease  . Obesity   . Depression with anxiety   . OSA on CPAP   : He denies any ulcer disease, thyroid disease, no history of hepatitis, yellow jaundice, malaria, mononucleosis, prostate trouble, seizures, inflammatory arthritis or other  arthritis. He did have kidney stones about 10 years ago.   Past Surgical History  Procedure Laterality Date  . Tonsillectomy    . Anal fissure repair    :  . amLODipine  10 mg Oral Daily  . antiseptic oral rinse  15 mL Mouth Rinse BID  . ARIPiprazole  2 mg Oral Daily  . bisoprolol  5 mg Oral Daily  . feeding supplement (ENSURE COMPLETE)  237 mL Oral BID AC  . hydrochlorothiazide  12.5 mg Oral Daily  . insulin aspart  0-9 Units Subcutaneous TID WC  . irbesartan  150 mg Oral Daily  . pantoprazole  40 mg Oral QHS  . sertraline  300 mg Oral Daily  . simvastatin  20 mg Oral q1800  . Warfarin - Pharmacist Dosing Inpatient   Does not apply q1800  :  Allergies  Allergen Reactions  . Penicillins  rash   :  Family History  Problem Relation Age of Onset  . Cancer Mother   . Heart disease Father   :  History   Social History  . Marital Status: Married    Spouse Name: [name]     Number of Children: 2  . Years of Education: College    Occupational History  .  [employer]   Social History Main Topics  . Smoking status: Never Smoker   .  Smokeless tobacco: Never Used  . Alcohol Use: No  . Drug Use: No  . Sexual Activity: Not on file   Other Topics Concern  . Not on file   Social History Narrative   Patient lives at home with wife Vernona RiegerLaura.    Patient has 2 children.    Patient is currently working.    Patient has a Degree.   :  ROS: Eyes: No double vision or blurred vision Throat: Swallowing dysfunction associated with current stroke Neck: No stiff neck Resp: No cough. Resolved dyspnea.  Cardio: No ischemic type chest pain or palpitations GI: No abdominal pain  Extremities: No edema Lymph nodes: Previous removal of benign lesions from his left axillary area Neurologic:  See above. Persistent right hemiplegia. Skin: . No rash or ecchymosis Genitourinary: Not questioned. Foley catheter in place. No prostate trouble. Vitals: Filed Vitals:   03/20/14 1307   BP: 154/80  Pulse: 70  Temp: 98 F (36.7 C)  Resp: 18   weight 222 pounds height 5 feet 9 inches   PHYSICAL EXAM: General appearance: Well-nourished Caucasian man in no distress HEENT: Pharynx no erythema or exudate, neck is supple, Lymph Nodes: No cervical, supraclavicular, or axillary adenopathy Resp: Lungs clear to auscultation and resonant to percussion Cardio: Regular cardiac rhythm without murmur or gallop. No peripheral edema Vascular: Carotids 2+. No bruits. Dorsalis pedis pulses 2+ symmetric, posterior tibial pulses 2+ symmetric Breasts: GI: Abdomen is soft, nontender, no mass, no organomegaly GU: Foley catheter in place Extremities: No edema, no calf tenderness. Neurologic: Alert and oriented, mildly dysarthric, no gross facial droop, cranial nerves grossly normal, formal visual and hearing testing not done. Full extraocular movements. No nystagmus. Motor strength 5 over 5 left upper and left lower extremity. He is unable to lift his right arm against gravity or resistance. 0/5 handgrip. 3/5 motor strength at the right ankle with 0-1/5 proximal muscle strength. Hyperreflexia 3+ right knee compared with 1+ left knee. No clonus. Right toe points upward. Sensation intact to touch. Skin: No rash or ecchymosis.  Labs:   Recent Labs  03/19/14 0630 03/20/14 0450  WBC 10.6* 11.1*  HGB 14.1 14.0  HCT 39.8 40.0  PLT 201 255    Recent Labs  03/18/14 0043  NA 141  K 3.7  CL 105  CO2 17*  GLUCOSE 101*  BUN 14  CREATININE 0.79  CALCIUM 9.2      Images Studies/Results: See discussion above        Assessment: Active Problems:    Acute massive pulmonary embolism likely arising from right lower extremity deep venous thrombosis    Recent recurrent ischemic stroke    Hypertension, diabetes, hyperlipidemia, sleep apnea, and obesity complicating the above    Recommendation:  I would recommend continuing full dose Coumadin anticoagulation and adding back  low-dose aspirin 81 mg daily. With respect to massive pulmonary emboli with initial hemodynamic instability, I don't think we have enough experience with the new target specific oral anticoagulants to feel comfortable that they represent a new gold standard  despite their many advantages over a warfarin.  Although he had an excellent result with thrombolytic therapy, and he has normal renal function, it is my bias and the bias of a number of other experts in the field that warfarin still remains the standard of care when faced with a large clot burden at presentation or clots that occur in unusual locations.  I would plan on 3 months of full dose anticoagulation with warfarin and  then at that point when risk for rethrombosis decreases, consider switching to an alternative target specific oral anticoagulants such as rivaroxaban.  I would low-dose aspirin for the antiplatelet effects on his cerebral circulation but I would not resume Plavix. At least one large clinical trial looking at combination of a target specific oral anticoagulant with 2 antiplatelet agents had to be stopped for unacceptable bleeding.  He could be changed over to therapeutic low molecular weight heparin at this time while we adjust his warfarin. We can repeat the lupus anticoagulant test when he is off all heparin derivatives and could assess protein S and protein C activity eventually when he comes off warfarin but the chance that he has any of these conditions is extremely low.    I will be happy to see him in followup as an outpatient in the [facility] internal medicine clinic.    Genene Churn Granfortuna 03/20/2014, 7:00 PM

## 2014-03-20 NOTE — Progress Notes (Signed)
ANTICOAGULATION CONSULT NOTE - Follow Up Consult  Pharmacy Consult for Heparin and Coumadin  Indication: pulmonary embolus  Allergies  Allergen Reactions  . Penicillins Hives    Patient Measurements: Height: 5\' 8"  (172.7 cm) Weight: 222 lb 11.2 oz (101.016 kg) IBW/kg (Calculated) : 68.4 Heparin Dosing Weight: 92 kg  Vital Signs: Temp: 98.3 F (36.8 C) (05/04 0534) Temp src: Oral (05/04 0534) BP: 156/72 mmHg (05/04 1012) Pulse Rate: 59 (05/04 1012)  Labs:  Recent Labs  03/18/14 0043  03/18/14 1424 03/19/14 0630 03/20/14 0450  HGB 13.5  --   --  14.1 14.0  HCT 39.5  --   --  39.8 40.0  PLT 174  --   --  201 255  LABPROT 15.9*  --   --  14.8 14.6  INR 1.30  --   --  1.19 1.16  HEPARINUNFRC  --   < > 0.42 0.37 0.45  CREATININE 0.79  --   --   --   --   < > = values in this interval not displayed.  Estimated Creatinine Clearance: 110.2 ml/min (by C-G formula based on Cr of 0.79).  Assessment: 63 y/o M on heparin for PE, s/p catheter-directed lysis 4/28->4/29.  Heparin begun 4/29 pm, but held 4/30 pm due to groin bleeding. Resumed 5/1 after bleeding resolved and CTA showed clot burden significantly reduced, IVC not needed. Heparin level remains at goal (0.45) on 1450 units/hr.  Coumadin begun with 7.5 mg on 5/3. Day #2 overlap. INR 1.16, essentially unchanged.  Noted plan for Dr. Cyndie ChimeGranfortuna to see.  Goal of Therapy:  INR 2-3 Heparin level 0.3-0.5 units/ml (lower end or range d/t CVA in March) Monitor platelets by anticoagulation protocol: Yes   Plan:   Continue heparin drip at 1450 units/hr.  Repeat Coumadin 7.5 mg po today.  Continue Heparin/Coumadin overlap a minimum of 5 days, or until INR >2 x 2 consecutive days.  Continue daily heparin level, CBC and PT/INR.  Will follow up Dr. Patsy LagerGranfortuna's input.  Dennie Fettersheresa Donovan Daiveon Markman, [place] Pager: [phone]  03/20/2014 1:13 PM

## 2014-03-20 NOTE — Progress Notes (Signed)
Writer of this note, noticed pt IV is out of date. 2RNs assessed pt and is hard stick. Paged IV team, IV team called back said it will be taken cared of in the day time. Pt IV still infusing well. We will continue to monitor.

## 2014-03-21 LAB — BASIC METABOLIC PANEL
BUN: 20 mg/dL (ref 6–23)
CHLORIDE: 103 meq/L (ref 96–112)
CO2: 23 mEq/L (ref 19–32)
Calcium: 9.6 mg/dL (ref 8.4–10.5)
Creatinine, Ser: 0.98 mg/dL (ref 0.50–1.35)
GFR calc Af Amer: >90 mL/min (ref >90)
GFR calc non Af Amer: 86 mL/min — ABNORMAL LOW (ref >90)
GLUCOSE: 132 mg/dL — AB (ref 70–99)
POTASSIUM: 3.8 meq/L (ref 3.7–5.3)
Sodium: 141 mEq/L (ref 137–147)

## 2014-03-21 LAB — ANTIPHOSPHOLIPID SYNDROME EVAL, BLD
ANTICARDIOLIPIN IGM: 2 [MPL'U]/mL — AB (ref <11)
Anticardiolipin IgA: 9 APL U/mL — ABNORMAL LOW (ref <22)
Anticardiolipin IgG: 7 GPL U/mL — ABNORMAL LOW (ref <23)
DRVVT: 38.7 s (ref <42.9)
Lupus Anticoagulant: DETECTED — AB
PTT Lupus Anticoagulant: 134 secs — ABNORMAL HIGH (ref 28.0–43.0)
PTTLA 4:1 Mix: 131.7 secs — ABNORMAL HIGH (ref 28.0–43.0)
PTTLA Confirmation: 20.3 secs — ABNORMAL HIGH (ref <8.0)
Phosphatydalserine, IgA: 7 U/mL (ref <20)
Phosphatydalserine, IgG: 9 U/mL (ref <16)
Phosphatydalserine, IgM: 6 U/mL (ref <22)

## 2014-03-21 LAB — PROTIME-INR
INR: 1.74 — ABNORMAL HIGH (ref 0.00–1.49)
Prothrombin Time: 19.8 seconds — ABNORMAL HIGH (ref 11.6–15.2)

## 2014-03-21 LAB — CBC
HEMATOCRIT: 42.8 % (ref 39.0–52.0)
Hemoglobin: 15.1 g/dL (ref 13.0–17.0)
MCH: 30.5 pg (ref 26.0–34.0)
MCHC: 35.3 g/dL (ref 30.0–36.0)
MCV: 86.5 fL (ref 78.0–100.0)
Platelets: 263 10*3/uL (ref 150–400)
RBC: 4.95 MIL/uL (ref 4.22–5.81)
RDW: 13.3 % (ref 11.5–15.5)
WBC: 12.4 10*3/uL — ABNORMAL HIGH (ref 4.0–10.5)

## 2014-03-21 LAB — GLUCOSE, CAPILLARY
GLUCOSE-CAPILLARY: 144 mg/dL — AB (ref 70–99)
GLUCOSE-CAPILLARY: 127 mg/dL — AB (ref 70–99)
GLUCOSE-CAPILLARY: 147 mg/dL — AB (ref 70–99)
GLUCOSE-CAPILLARY: 139 mg/dL — AB (ref 70–99)

## 2014-03-21 LAB — HEPARIN LEVEL (UNFRACTIONATED): Heparin Unfractionated: 0.34 IU/mL (ref 0.30–0.70)

## 2014-03-21 MED ORDER — NONFORMULARY OR COMPOUNDED ITEM: Status: DC

## 2014-03-21 MED ORDER — ASPIRIN EC 81 MG PO TBEC: 81.0000 mg | DELAYED_RELEASE_TABLET | Freq: Every day | ORAL | Status: DC

## 2014-03-21 MED ORDER — ENOXAPARIN SODIUM 100 MG/ML ~~LOC~~ SOLN
100.0000 mg | Freq: Two times a day (BID) | SUBCUTANEOUS | Status: DC
  Administered 2014-03-21 – 2014-03-22 (×3): 100 mg via SUBCUTANEOUS
  Filled 2014-03-21 (×4): qty 1

## 2014-03-21 MED ORDER — ENOXAPARIN SODIUM 100 MG/ML ~~LOC~~ SOLN: 100.0000 mg | Freq: Two times a day (BID) | SUBCUTANEOUS | Status: DC

## 2014-03-21 MED ORDER — ENSURE COMPLETE PO LIQD
237.0000 mL | Freq: Two times a day (BID) | ORAL | Status: DC
Start: 2014-03-21 — End: 2016-01-22

## 2014-03-21 MED ORDER — WARFARIN SODIUM 5 MG PO TABS
5.0000 mg | ORAL_TABLET | Freq: Once | ORAL | Status: AC
  Administered 2014-03-21: 5 mg via ORAL
  Filled 2014-03-21 (×2): qty 1

## 2014-03-21 MED ORDER — BISOPROLOL FUMARATE 5 MG PO TABS: 5.0000 mg | ORAL_TABLET | Freq: Every day | ORAL | Status: DC

## 2014-03-21 MED ORDER — WARFARIN SODIUM 5 MG PO TABS: 5.0000 mg | ORAL_TABLET | Freq: Once | ORAL | Status: DC

## 2014-03-21 NOTE — Care Management (Signed)
Pt placed self on CPap for the night. Will monotor

## 2014-03-21 NOTE — Progress Notes (Signed)
ANTICOAGULATION CONSULT NOTE - Follow Up Consult  Pharmacy Consult for Heparin->Lovenox and Coumadin  Indication: pulmonary embolus  Allergies  Allergen Reactions  . Penicillins Hives    Patient Measurements: Height: 5\' 8"  (172.7 cm) Weight: 223 lb 8.7 oz (101.4 kg) IBW/kg (Calculated) : 68.4  Vital Signs: Temp: 98.2 F (36.8 C) (05/05 0456) Temp src: Oral (05/05 0456) BP: 157/79 mmHg (05/05 0456) Pulse Rate: 48 (05/05 0456)  Labs:  Recent Labs  03/19/14 0630 03/20/14 0450 03/21/14 0710  HGB 14.1 14.0 15.1  HCT 39.8 40.0 42.8  PLT 201 255 263  LABPROT 14.8 14.6 19.8*  INR 1.19 1.16 1.74*  HEPARINUNFRC 0.37 0.45 0.34  CREATININE  --   --  0.98    Estimated Creatinine Clearance: 90.2 ml/min (by C-G formula based on Cr of 0.98).  Assessment: 63 y/o M on heparin for PE, s/p catheter-directed lysis 4/28->4/29.  Heparin begun 4/29 pm, but held 4/30 pm due to groin bleeding. Resumed 5/1 after bleeding resolved and CTA showed clot burden significantly reduced, IVC not needed.    Heparin level remains at goal (0.34) on 1450 units/hr. To transition to Lovenox today for discharge to SNF for Rehab. Day #3 overlap. INR up to 1.74 after Coumadin 7.5 mg x 2.   Noted plan to add ASA 81 mg, stay off Plavix.  Goal of Therapy:  INR 2-3 Heparin level 0.3-0.5 units/ml  Monitor platelets by anticoagulation protocol: Yes   Plan:   Discontinue IV heparin; begin Lovenox 100 mg SQ 12hrs ~ 1 hr later.  Decrease Coumadin to 5 mg today.  Continue Lovenox/Coumadin overlap a minimum of 5 days, or until INR >2 x 2 consecutive days.  Daily PT/INR.  Coumadin education completed.  Dennie Fettersheresa Donovan Jerran Tappan, [place] Pager: [phone]  03/21/2014 10:54 AM

## 2014-03-21 NOTE — Progress Notes (Deleted)
PULMONARY / CRITICAL CARE MEDICINE   Name: Dakota Hall MRN: [id number] DOB: 09/23/1951    ADMISSION DATE:  03/14/2014 CONSULTATION DATE:  03/14/2014  REFERRING MD : EDP  PRIMARY SERVICE: Pulmonary   CHIEF COMPLAINT: Acute resp failure   BRIEF PATIENT DESCRIPTION:  63 year old male, residing at S/p recent CVA w/ right sided motor deficits. Began to c/o CP and worsening dyspnea first noted on 4/27 during PT. O2 sats recorded in 70% range on room air. Sent to ER 4/28 for evaluation. Dx eval showed massive saddle embolus w/ evidence or right heart strain. PCCM asked to admit.   SIGNIFICANT EVENTS / STUDIES:  CT chest 4/28: Massive bilateral pulmonary emboli with saddle embolus and evidence of right heart strain/failure.  ECHO 4/28 >>> EF 55-60%, RV hemodynamic compromise from probable large saddle pulmonary embolus.  There is thrombus in transit in the RA.   LE Doppler US 4/28 >>> acute DVT throughout popliteal vein; age indeterminate DVT throughout posterior tibial vein; acute SVT involving proximal segment of lesser saphenous vein. CT chest 05/1>>>Significantly decreased pulmonary embolic burden with significant  reduction in right heart strain and near complete normalization of the RV/LV ratio   LINES / TUBES:  04/28 B/L PA infusion catheters> removed 4/30  CULTURES:  Blood cultures x 2  04/28 > NEG  ANTIBIOTICS: None   INTERVAL HISTORY:    nad RA, anxious to be discharged from hospital  VITAL SIGNS: Temp:  [97.8 F (36.6 C)-98.2 F (36.8 C)] 98.2 F (36.8 C) (05/05 0456) Pulse Rate:  [48-70] 48 (05/05 0456) Resp:  [18-20] 18 (05/05 0456) BP: (150-157)/(72-80) 157/79 mmHg (05/05 0456) SpO2:  [95 %-96 %] 96 % (05/05 0456) Weight:  [101.4 kg (223 lb 8.7 oz)] 101.4 kg (223 lb 8.7 oz) (05/05 0456) Room air    INTAKE / OUTPUT: Intake/Output     05/04 0701 - 05/05 0700 05/05 0701 - 05/06 0700   P.O. 720    I.V. (mL/kg) 538.4 (5.3)    Total Intake(mL/kg) 1258.4 (12.4)    Urine (mL/kg/hr) 1200 (0.5)    Total Output 1200     Net +58.4           PHYSICAL EXAMINATION: General:  In NAD Neuro:  AAO x 3, responding appropriately  HEENT:  NCAT, no JVD noted.  Cardiovascular:  RRR Lungs:  Clear, no w/r/r. Even, unlabored Abdomen:  + BS, soft, NT, ND Musculoskeletal:  Residual right sided weakness, RUE > RLE Skin:  Intact   LABS:  CBC  Recent Labs Lab 03/19/14 0630 03/20/14 0450 03/21/14 0710  WBC 10.6* 11.1* 12.4*  HGB 14.1 14.0 15.1  HCT 39.8 40.0 42.8  PLT 201 255 263   Coag's  Recent Labs Lab 03/14/14 1130  03/19/14 0630 03/20/14 0450 03/21/14 0710  APTT 35  --   --   --   --   INR 1.20  < > 1.19 1.16 1.74*  < > = values in this interval not displayed. BMET  Recent Labs Lab 03/17/14 0703 03/18/14 0043 03/21/14 0710  NA 141 141 141  K 3.4* 3.7 3.8  CL 103 105 103  CO2 17* 17* 23  BUN 11 14 20   CREATININE 0.82 0.79 0.98  GLUCOSE 128* 101* 132*   Electrolytes  Recent Labs Lab 03/17/14 0703 03/18/14 0043 03/21/14 0710  CALCIUM 8.9 9.2 9.6  MG  --  1.9  --   PHOS  --  2.6  --    Sepsis Markers  Recent Labs Lab 03/14/14 1443  LATICACIDVEN 1.72   ABG  Recent Labs Lab 03/14/14 1323  PHART 7.416  PCO2ART 26.1*  PO2ART 62.0*   Cardiac Enzymes  Recent Labs Lab 03/14/14 1130  PROBNP 5474.0*   Glucose  Recent Labs Lab 03/19/14 2141 03/20/14 0743 03/20/14 1155 03/20/14 1736 03/20/14 2137 03/21/14 0823  GLUCAP 167* 141* 177* 124* 129* 144*    Imaging No results found.  ASSESSMENT / PLAN:  Acute hypoxic respiratory failure in setting of massive B/L pulmonary emboli following recent hospitalization/rehab for acute CVA.  [place] weaned  To RA  PAP is much improved per IR.   -Catheter directed tPA completed 04/29   -Heparin IV bridge-->will convert to LMWH (see heme note on 5/4)  -Coumadin started 5/3 per pharmacy. Will plan for minimum overlap of 5 days w/ 2 consecutive days of INR >2.   -can repeat  lupus anticoagulant after d/c and off heparin derivatives  -can assess Protein S and C activity when off coumadin  -will set him up w/ heme f/u   OSA  Cont home CPAP settings Elevated CEs in setting of PE decreased clot burden and RV strain nearly resolved by CTA 5/1. HTN  D/c tele  PE treated as above  Continue home antihypertensives   Antiplatelet w/ ASA 81 mg daily. No more plavix    H/o GERD -GI ppx - Protonix  Mild leukocytosis  -Trend fever curve  -Trend CBC  DM  -SSI -CBG monitoring -Carb modified diet  H/o CVA w/ right sided deficits  -Resume rehab efforts Ok to go back to rehab today      03/21/2014 10:07 AM

## 2014-03-21 NOTE — Progress Notes (Addendum)
Physical Therapy Treatment Patient Details Name: Dionne AnoRoger W Cosner MRN: [id number] DOB: 06/16/1951 Today's Date: 03/21/2014    History of Present Illness Pt adm with massive saddle PE. Pt with recent Lt MCA stroke and had just been dc'd from CIR to SNF.    PT Comments    Pt seemed to require increased assist to prevent Rt knee from buckling today & increased manual assist to manage RLE when stepping forwards/backwards & positioning in standing.  Pt states he has not been out of bed since last PT session which was 03/17/14.      Follow Up Recommendations  SNF     Equipment Recommendations       Recommendations for Other Services       Precautions / Restrictions Precautions Precautions: Fall Precaution Comments: Rt hemiparesis  Restrictions Weight Bearing Restrictions: No    Mobility  Bed Mobility Overal bed mobility: Needs Assistance Bed Mobility: Supine to Sit     Supine to sit: Mod assist     General bed mobility comments: Hand over hand grasp to assist pt to sitting upright.    Transfers Overall transfer level: Needs assistance Equipment used: 2 person hand held assist Transfers: Sit to/from [employer] CorporationStand;Stand Pivot Transfers Sit to Stand: +2 physical assistance;Mod assist Stand pivot transfers: +2 physical assistance;Max assist       General transfer comment: Blocking of Rt knee due to instability.  Rt knee buckling worse today than previous PT session.    Ambulation/Gait             General Gait Details: Performed pre-gait activities consisting of weight shifting & stepping forwards/backwards.  Pt required increased manual assist to move RLE & to block Rt knee due to buckling when stepping with LLE.  .     Stairs            Wheelchair Mobility    Modified Rankin (Stroke Patients Only)       Balance Overall balance assessment: Needs assistance         Standing balance support: Single extremity supported Standing balance-Leahy Scale:  Poor Standing balance comment: Pt with decreased stability RLE today compared to previous PT session.  Unable to maintain knee extension unless manual assist provided above knee                    Cognition Arousal/Alertness: Awake/alert Behavior During Therapy: [facility] for tasks assessed/performed                        Exercises      General Comments        Pertinent Vitals/Pain No pain reported    Home Living                      Prior Function            PT Goals (current goals can now be found in the care plan section) Acute Rehab PT Goals Patient Stated Goal: Get stronger PT Goal Formulation: With patient Time For Goal Achievement: 03/23/14 Potential to Achieve Goals: Good    Frequency  Min 3X/week    PT Plan Current plan remains appropriate    Co-evaluation             End of Session Equipment Utilized During Treatment: Gait belt Activity Tolerance: Patient tolerated treatment well Patient left: in chair;with call bell/phone within reach     Time: 0927-0951 PT Time Calculation (min): 24 min  Charges:  $Therapeutic Activity: 23-37 mins                    G Codes:      Lara MulchKelly Lynn Diron Haddon 03/21/2014, 12:51 PM   Verdell FaceKelly Sabrina Keough, PTA [phone] 03/21/2014

## 2014-03-21 NOTE — Discharge Summary (Deleted)
Physician Discharge Summary       Patient ID: Dakota Hall MRN: [id number] DOB/AGE: 63/02/1951 63 y.o.  Admit date: 03/14/2014 Discharge date: 03/22/2014  Discharge Diagnoses:  Active Problems:   Pulmonary emboli   Acute massive pulmonary embolism   Malnutrition of moderate degree resolved lactic acidosis/ anion gap acidosis in setting of acute Pulmonary emboli Obstructive sleep apnea History of CVA with right sided functional deficits  Abnormal cardiac enzymes in the setting of acute pulmonary emboli Deconditioning HTN DM GERD  Detailed Hospital Course:    63 year old male, residing at rehab snf S/p recent CVA w/ right sided motor deficits. Was at Rehab facility participating on physical therapy when staff reported O2 sats 74% on RA, placed on 3L and returned 92%. On evaluation in the ER the pt reported shortness of breath during PT with Chest pain First noted on 4/27, was given 2 nitro at that time with relief. Staff did a troponin I at that time, 0.34 results and 12 lead showing ST. 12 lead unremarkable, denies chest pain ON ADMIT. In NAD. She was sent to the ER for further evaluation. Dx eval showed massive saddle embolus w/ evidence or right heart strain. PCCM asked to admit.   Was admitted to the intensive care on IV heparin infusion. We discussed IV TPA in the ER but were concerned as he had already received TPA just recently for his acute CVA. We obtained TEE at bedside which showed Right ventricular dilation, right ventricular strain and evidence of right ventricular failure. Because of this AND his work of breathing we felt he would be a candidate for catheter directed Pulmonary emboli lysis. Interventional radiology was consulted. He underwent successful placement of bilateral Pulmonary artery infusion caths on 4/28 and he was started on a 12 hour TPA infusion. He was monitored in the intensive care. The TPA infusion was completed on 4/29. IV Heparin was resumed 5/1.   Repeat CT  angio on 5/1 showed: >Significantly decreased pulmonary embolic burden with significant reduction in right heart strain and near complete normalization of the RV/LV ratio. His work of breathing had remarkably improved.  He was started on coumadin 5/3.   We asked for hematology consult on 5/4 in effort to help guide future therapy. He was seen by Dr Cyndie ChimeGranfortuna. His recommendations were as follows:   -He could be changed over to therapeutic low molecular weight heparin at this time while we adjust his warfarin.  -Discharge with 3 months of Coumadin (full dose)  -After 3 months when risk of re-thrombosis is decreased he could be considered for alternative target specific oral anticoagulants such as rivaroxaban  -repeat the lupus anticoagulant test when he is off all heparin derivatives   -assess protein S and protein C activity eventually when he comes off warfarin but the chance that he has any of these conditions is extremely low.   Medically we felt that he was stable for discharge back to SNF/rehab facility as of 5/5 with the plan as outlined below.    Discharge Plan by diagnoses   Acute hypoxic respiratory failure in setting of massive B/L pulmonary emboli following recent hospitalization/rehab for acute CVA. [place] weaned To RA PAP is much improved per IR.  -Catheter directed tPA completed 04/29  - LMWH & coumadin overlap  -Coumadin started 5/3 per pharmacy. Will plan for minimum overlap of 5 days w/ 2 consecutive days of INR >2.  -can repeat lupus anticoagulant after d/c and off heparin derivatives  -can  assess Protein S and C activity when off coumadin  -will set him up w/ heme and pulmonary f/u  OSA  Cont home CPAP settings   Elevated CEs in setting of PE decreased clot burden and RV strain nearly resolved by CTA 5/1.  HTN  Continue home antihypertensives  Antiplatelet w/ ASA 81 mg daily. No more plavix   H/o GERD  -GI ppx - Protonix   DM  -SSI  -CBG monitoring  -Carb modified  diet  H/o CVA w/ right sided deficits  -Resume rehab efforts   Significant Hospital tests/ studies/ interventions and procedures  Consults: hematology  SIGNIFICANT EVENTS / STUDIES:  CT chest 4/28: Massive bilateral pulmonary emboli with saddle embolus and evidence of right heart strain/failure.  ECHO 4/28 >>> EF 55-60%, RV hemodynamic compromise from probable large saddle pulmonary embolus. There is thrombus in transit in the RA.  LE Doppler US 4/28 >>> acute DVT throughout popliteal vein; age indeterminate DVT throughout posterior tibial vein; acute SVT involving proximal segment of lesser saphenous vein.  4/28: Successful placement of bilateral PA infusion caths for TPA LYSIS CT chest 05/1>>>Significantly decreased pulmonary embolic burden with significant reduction in right heart strain and near complete normalization of the RV/LV ratio   Discharge Exam: BP 149/78  Pulse 42  Temp(Src) 97.8 F (36.6 C) (Oral)  Resp 18  Ht 5\' 8"  (1.727 m)  Wt 225 lb 12 oz (102.4 kg)  BMI 34.33 kg/m2  SpO2 96%  General: In NAD  Neuro: AAO x 3, responding appropriately  HEENT: NCAT, no JVD noted.  Cardiovascular: RRR  Lungs: Clear, no w/r/r. Even, unlabored  Abdomen: + BS, soft, NT, ND  Musculoskeletal: Residual right sided weakness, RUE > RLE  Skin: Intact    Labs at discharge Lab Results  Component Value Date   CREATININE 0.98 03/21/2014   BUN 20 03/21/2014   NA 141 03/21/2014   K 3.8 03/21/2014   CL 103 03/21/2014   CO2 23 03/21/2014   Lab Results  Component Value Date   WBC 11.9* 03/22/2014   HGB 13.8 03/22/2014   HCT 40.1 03/22/2014   MCV 87.2 03/22/2014   PLT 271 03/22/2014   Lab Results  Component Value Date   ALT 44 02/09/2014   AST 35 02/09/2014   ALKPHOS 56 02/09/2014   BILITOT 0.6 02/09/2014   Lab Results  Component Value Date   INR 2.32* 03/22/2014   INR 1.74* 03/21/2014   INR 1.16 03/20/2014    Current radiology studies No results found.  Disposition:  03-Skilled Nursing  Facility      Discharge Orders   Future Appointments Provider Department Dept Phone   04/07/2014 12:30 PM Erick Colace, MD [facility] [phone]   05/03/2014 2:00 PM Kalman Shan, MD [facility] [phone]   07/27/2014 2:00 PM Nilda Riggs, NP [facility] [phone]   Future Orders Complete By Expires   Diet - low sodium heart healthy  As directed    Increase activity slowly  As directed        Medication List    STOP taking these medications       clopidogrel 75 MG tablet  Commonly known as:  PLAVIX     LORazepam 0.5 MG tablet  Commonly known as:  ATIVAN     potassium chloride 10 MEQ tablet  Commonly known as:  K-DUR,KLOR-CON      TAKE these medications       amLODipine 10 MG tablet  Commonly known as:  NORVASC  Take 10 mg by mouth daily.     ARIPiprazole 2 MG tablet  Commonly known as:  ABILIFY  Take 2 mg by mouth daily.     aspirin EC 81 MG tablet  Take 1 tablet (81 mg total) by mouth daily.     baclofen 20 MG tablet  Commonly known as:  LIORESAL  Take 1 tablet (20 mg total) by mouth 3 (three) times daily.     bisoprolol 5 MG tablet  Commonly known as:  ZEBETA  Take 1 tablet (5 mg total) by mouth daily.     enoxaparin 100 MG/ML injection  Commonly known as:  LOVENOX  Inject 1 mL (100 mg total) into the skin every 12 (twelve) hours.     feeding supplement (ENSURE COMPLETE) Liqd  Take 237 mLs by mouth 2 (two) times daily before lunch and supper.     insulin aspart 100 UNIT/ML injection  Commonly known as:  novoLOG  - Inject 2-12 Units into the skin 3 (three) times daily before meals. BCG:  - 151-200= 2 units  - 201-250= 4 units  - 251-300= 6 units  - 301-350= 8 units  - 351-400= 10 units  - 401 > 12 units     metFORMIN 500 MG tablet  Commonly known as:  GLUCOPHAGE  Take 1,000 mg by mouth daily.     NONFORMULARY OR COMPOUNDED ITEM  - 1) PLEASE CHECK DAILY INR and  adjust Coumadin for goal INR of 2-3  - 2) continue Lovenox every 12 hours thru 5/8 (minimum) AND also must have 2 consecutive days of therapeutic INR > 2 before stopping     pantoprazole 40 MG tablet  Commonly known as:  PROTONIX  Take 1 tablet (40 mg total) by mouth at bedtime.     pioglitazone 15 MG tablet  Commonly known as:  ACTOS  Take 15 mg by mouth daily.     polyethylene glycol packet  Commonly known as:  MIRALAX / GLYCOLAX  Take 17 g by mouth daily.     sertraline 100 MG tablet  Commonly known as:  ZOLOFT  Take 300 mg by mouth daily.     simvastatin 20 MG tablet  Commonly known as:  ZOCOR  Take 1 tablet (20 mg total) by mouth daily at 6 PM.     traZODone 50 MG tablet  Commonly known as:  DESYREL  Take 50 mg by mouth daily as needed for sleep. For sleep     valsartan-hydrochlorothiazide 160-12.5 MG per tablet  Commonly known as:  DIOVAN-HCT  Take 1 tablet by mouth daily.     Vitamin D (Ergocalciferol) 50000 UNITS Caps capsule  Commonly known as:  DRISDOL  50,000 Units every 7 (seven) days.     warfarin 5 MG tablet  Commonly known as:  COUMADIN  Take 1 tablet (5 mg total) by mouth one time only at 6 PM.       Follow-up Information   Follow up with [facility], MD On 05/03/2014. (2pm )    Specialty:  Pulmonary Disease   Contact information:   [address] [place] [address] [phone]       Follow up with Dr Cyndie Chime  On 03/21/2014. (Dr Patsy Lager office will contact you )    Contact information:   [facility]  [place] [facility] [phone]  ask for Tyler Aas     [phone]         Discharged Condition:  good  Physician Statement:   The Patient was personally examined, the discharge assessment and plan has been personally reviewed and I agree with ACNP Babcock's assessment and plan. > 30 minutes of time have been dedicated to discharge assessment, planning and discharge instructions.   Signed: Alyson ReedyWesam G  Rayana Geurin 03/22/2014, 12:38 PM

## 2014-03-21 NOTE — Clinical Social Work Note (Signed)
Handoff from previous CSW states that [facility]. CSW made several attempts to reach facility that patient is from. Facility contacted CSW back around 4:00PM. Facility states that they cannot accept patient back until they receive [place] authorization for SNF. CSW asked facility if they would take patient back with LOG until authorization could be obtained, but facility stated that they would not do this. Facility states that they are confident that they will have authorization by 03/22/14. CS[facility] will continue to [facility].   Roddie McBryant Ashraf Mesta MSW, [place], [place], [phone]

## 2014-03-21 NOTE — Discharge Instructions (Signed)
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 It causes repeated (recurrent) but temporary episodes of paralysis. It develops in childhood, usually before the first 63 years of age. The paralysis happens on one side of the body. It can affect:   Eye movements.  Arms and legs.  Facial muscles. CAUSES  The exact cause of the disorder is unknown but it appears to be related to migraines. It can also occur due to stress or sleep deprivation.  SYMPTOMS  Symptoms of alternating hemiplegia can include:  No movement (paralysis) to one side of the body.  Balance and walking (gait) difficulties.  Excessive sweating.  Changes in body temperature.  Convulsions (seizures).  Decreased mental abilities (mentalimpairment). TREATMENT  Drug therapy can help with alternating hemiplegia. It may reduce how severe the attacks of paralysis are and how long they last. Sleep can also help when an episode of alternating hemiplegia develops.  Document Released: 07/26/2002 Document Revised: 01/26/2012 Document Reviewed: 10/27/2008 [facility] Patient Information 2014 [place], [place].   Information on my medicine - Coumadin   (Warfarin)  This medication education was reviewed with me or my healthcare representative as part of my discharge preparation.  The pharmacist that spoke with me during my hospital stay was:  Dennie Fettersheresa Donovan Celester Lech, [facility]oga[facility]PH  Why was Coumadin prescribed for you? Coumadin was prescribed for you because you have a blood clot or a medical condition that can cause an increased risk of forming blood clots. Blood clots can cause serious health problems by blocking the flow of blood to the heart, lung, or brain. Coumadin can prevent harmful blood clots from forming. As a reminder your indication for Coumadin is:   Pulmonary Embolism Treatment  What test will check on my response to Coumadin? While on Coumadin (warfarin) you will need to have an INR  test regularly to ensure that your dose is keeping you in the desired range. The INR (international normalized ratio) number is calculated from the result of the laboratory test called prothrombin time (PT).  If an INR APPOINTMENT HAS NOT ALREADY BEEN MADE FOR YOU please schedule an appointment to have this lab work done by your health care provider within 7 days. Your INR goal is usually a number between:  2 to 3 or your provider may give you a more narrow range like 2-2.5.  Ask your health care provider during an office visit what your goal INR is.  What  do you need to  know  About  COUMADIN? Take Coumadin (warfarin) exactly as prescribed by your healthcare provider about the same time each day.  DO NOT stop taking without talking to the doctor who prescribed the medication.  Stopping without other blood clot prevention medication to take the place of Coumadin may increase your risk of developing a new clot or stroke.  Get refills before you run out.  What do you do if you miss a dose? If you miss a dose, take it as soon as you remember on the same day then continue your regularly scheduled regimen the next day.  Do not take two doses of Coumadin at the same time.  Important Safety Information A possible side effect of Coumadin (Warfarin) is an increased risk of bleeding. You should call your healthcare provider right away if you experience any of the following:   Bleeding from an injury or your nose that does not stop.   Unusual colored urine (red or dark [place]t) or unusual colored stools (red or black).  Unusual bruising for unknown reasons.   A serious fall or if you hit your head (even if there is no bleeding).  Some foods or medicines interact with Coumadin (warfarin) and might alter your response to warfarin. To help avoid this:   Eat a balanced diet, maintaining a consistent amount of Vitamin K.   Notify your provider about major diet changes you plan to make.   Avoid alcohol or limit  your intake to 1 drink for women and 2 drinks for men per day. (1 drink is 5 oz. wine, 12 oz. beer, or 1.5 oz. liquor.)  Make sure that ANY health care provider who prescribes medication for you knows that you are taking Coumadin (warfarin).  Also make sure the healthcare provider who is monitoring your Coumadin knows when you have started a new medication including herbals and non-prescription products.  Coumadin (Warfarin)  Major Drug Interactions  Increased Warfarin Effect Decreased Warfarin Effect  Alcohol (large quantities) Antibiotics (esp. Septra/Bactrim, Flagyl, Cipro) Amiodarone (Cordarone) Aspirin (ASA) Cimetidine (Tagamet) Megestrol (Megace) NSAIDs (ibuprofen, naproxen, etc.) Piroxicam (Feldene) Propafenone (Rythmol SR) Propranolol (Inderal) Isoniazid (INH) Posaconazole (Noxafil) Barbiturates (Phenobarbital) Carbamazepine (Tegretol) Chlordiazepoxide (Librium) Cholestyramine (Questran) Griseofulvin Oral Contraceptives Rifampin Sucralfate (Carafate) Vitamin K   Coumadin (Warfarin) Major Herbal Interactions  Increased Warfarin Effect Decreased Warfarin Effect  Garlic Ginseng Ginkgo biloba Coenzyme Q10 Green tea St. Johns wort    Coumadin (Warfarin) FOOD Interactions  Eat a consistent number of servings per week of foods HIGH in Vitamin K (1 serving =  cup)  Collards (cooked, or boiled & drained) Kale (cooked, or boiled & drained) Mustard greens (cooked, or boiled & drained) Parsley *serving size only =  cup Spinach (cooked, or boiled & drained) Swiss chard (cooked, or boiled & drained) Turnip greens (cooked, or boiled & drained)  Eat a consistent number of servings per week of foods MEDIUM-HIGH in Vitamin K (1 serving = 1 cup)  Asparagus (cooked, or boiled & drained) Broccoli (cooked, boiled & drained, or raw & chopped) Brussel sprouts (cooked, or boiled & drained) *serving size only =  cup Lettuce, raw (green leaf, endive, romaine) Spinach,  raw Turnip greens, raw & chopped   These websites have more information on Coumadin (warfarin):  http://www.king-russell.com/www.coumadin.com; https://www.hines.net/www.ahrq.gov/consumer/coumadin.htm;

## 2014-03-21 NOTE — Progress Notes (Signed)
NUTRITION FOLLOW UP  Intervention:   Continue Ensure Complete po BID, each supplement providing 350 kcals and 13 grams of protein  RD to continue to follow nutrition care plan   Nutrition Dx:   Inadequate oral intake related to decreased appetite as evidenced by pt report of eating 50% of meal intake; ongoing.   Goal:   Meet >/= 90% of estimated nutrition needs - improving  Monitor:   PO intake, supplement acceptance, weight trend, labs   Assessment:   63 year old male, residing at S/p recent CVA w/ right sided motor deficits. Began to c/o CP and worsening dyspnea first noted on 4/27 during PT. Sent to ER 4/28 for evaluation. 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 PMHx significant for DM, HTN, stroke, obesity.   Per chart, pt with decreased clot burden and RV strain nearly resolved by CTA on 5/1.  Pt now on Regular diet, meal intake is 50-100%. Ordered for Ensure Complete PO BID before lunch and supper. Pt confirms that his appetite has improved since admission, but it's not great. States that he is leaving today. Drinking his Ensure as scheduled.  CBG's: 124 - 144  Height: Ht Readings from Last 1 Encounters:  03/16/14 5\' 8"  (1.727 m)    Weight Status:   Wt Readings from Last 1 Encounters:  03/21/14 223 lb 8.7 oz (101.4 kg)  Admit wt 233 lb  Re-estimated needs:  Kcal: 2000-2200 Protein: 110 - 125 g Fluid: >/= 2 L/ day  Skin: Ecchymosis on abdomen  Diet Order: Carb Control    Intake/Output Summary (Last 24 hours) at 03/21/14 1020 Last data filed at 03/21/14 0625  Gross per 24 hour  Intake 1018.43 ml  Output    800 ml  Net 218.43 ml    Last BM: 5/4  Labs:   Recent Labs Lab 03/17/14 0703 03/18/14 0043 03/21/14 0710  NA 141 141 141  K 3.4* 3.7 3.8  CL 103 105 103  CO2 17* 17* 23  BUN 11 14 20   CREATININE 0.82 0.79 0.98  CALCIUM 8.9 9.2 9.6  MG  --  1.9  --   PHOS  --  2.6  --   GLUCOSE 128* 101* 132*    CBG (last  3)   Recent Labs  03/20/14 1736 03/20/14 2137 03/21/14 0823  GLUCAP 124* 129* 144*    Scheduled Meds: . amLODipine  10 mg Oral Daily  . antiseptic oral rinse  15 mL Mouth Rinse BID  . ARIPiprazole  2 mg Oral Daily  . bisoprolol  5 mg Oral Daily  . enoxaparin (LOVENOX) injection  100 mg Subcutaneous Q12H  . feeding supplement (ENSURE COMPLETE)  237 mL Oral BID AC  . hydrochlorothiazide  12.5 mg Oral Daily  . insulin aspart  0-9 Units Subcutaneous TID WC  . irbesartan  150 mg Oral Daily  . pantoprazole  40 mg Oral QHS  . sertraline  300 mg Oral Daily  . simvastatin  20 mg Oral q1800  . Warfarin - Pharmacist Dosing Inpatient   Does not apply q1800    Continuous Infusions: . heparin 1,450 Units/hr (03/21/14 0142)    [name] MS, RD, LDN Inpatient Registered Dietitian Pager: [phone] After-hours pager: [phone]

## 2014-03-22 LAB — CBC
HCT: 40.1 % (ref 39.0–52.0)
Hemoglobin: 13.8 g/dL (ref 13.0–17.0)
MCH: 30 pg (ref 26.0–34.0)
MCHC: 34.4 g/dL (ref 30.0–36.0)
MCV: 87.2 fL (ref 78.0–100.0)
Platelets: 271 10*3/uL (ref 150–400)
RBC: 4.6 MIL/uL (ref 4.22–5.81)
RDW: 13.4 % (ref 11.5–15.5)
WBC: 11.9 10*3/uL — AB (ref 4.0–10.5)

## 2014-03-22 LAB — GLUCOSE, CAPILLARY
Glucose-Capillary: 144 mg/dL — ABNORMAL HIGH (ref 70–99)
Glucose-Capillary: 147 mg/dL — ABNORMAL HIGH (ref 70–99)

## 2014-03-22 LAB — PROTIME-INR
INR: 2.32 — AB (ref 0.00–1.49)
PROTHROMBIN TIME: 24.7 s — AB (ref 11.6–15.2)

## 2014-03-22 NOTE — Progress Notes (Signed)
Nsg Discharge Note  Admit Date:  03/14/2014 Discharge date: 03/22/2014   Dionne Anooger W Dyckman to be D/C'd Rehab per MD order.  AVS completed.  Copy for chart, and copy for patient signed, and dated. Patient/caregiver able to verbalize understanding.  Discharge Medication:   Medication List    STOP taking these medications       clopidogrel 75 MG tablet  Commonly known as:  PLAVIX     LORazepam 0.5 MG tablet  Commonly known as:  ATIVAN     potassium chloride 10 MEQ tablet  Commonly known as:  K-DUR,KLOR-CON      TAKE these medications       amLODipine 10 MG tablet  Commonly known as:  NORVASC  Take 10 mg by mouth daily.     ARIPiprazole 2 MG tablet  Commonly known as:  ABILIFY  Take 2 mg by mouth daily.     aspirin EC 81 MG tablet  Take 1 tablet (81 mg total) by mouth daily.     baclofen 20 MG tablet  Commonly known as:  LIORESAL  Take 1 tablet (20 mg total) by mouth 3 (three) times daily.     bisoprolol 5 MG tablet  Commonly known as:  ZEBETA  Take 1 tablet (5 mg total) by mouth daily.     enoxaparin 100 MG/ML injection  Commonly known as:  LOVENOX  Inject 1 mL (100 mg total) into the skin every 12 (twelve) hours.     feeding supplement (ENSURE COMPLETE) Liqd  Take 237 mLs by mouth 2 (two) times daily before lunch and supper.     insulin aspart 100 UNIT/ML injection  Commonly known as:  novoLOG  - Inject 2-12 Units into the skin 3 (three) times daily before meals. BCG:  - 151-200= 2 units  - 201-250= 4 units  - 251-300= 6 units  - 301-350= 8 units  - 351-400= 10 units  - 401 > 12 units     metFORMIN 500 MG tablet  Commonly known as:  GLUCOPHAGE  Take 1,000 mg by mouth daily.     NONFORMULARY OR COMPOUNDED ITEM  - 1) PLEASE CHECK DAILY INR and adjust Coumadin for goal INR of 2-3  - 2) continue Lovenox every 12 hours thru 5/8 (minimum) AND also must have 2 consecutive days of therapeutic INR > 2 before stopping     pantoprazole 40 MG tablet  Commonly  known as:  PROTONIX  Take 1 tablet (40 mg total) by mouth at bedtime.     pioglitazone 15 MG tablet  Commonly known as:  ACTOS  Take 15 mg by mouth daily.     polyethylene glycol packet  Commonly known as:  MIRALAX / GLYCOLAX  Take 17 g by mouth daily.     sertraline 100 MG tablet  Commonly known as:  ZOLOFT  Take 300 mg by mouth daily.     simvastatin 20 MG tablet  Commonly known as:  ZOCOR  Take 1 tablet (20 mg total) by mouth daily at 6 PM.     traZODone 50 MG tablet  Commonly known as:  DESYREL  Take 50 mg by mouth daily as needed for sleep. For sleep     valsartan-hydrochlorothiazide 160-12.5 MG per tablet  Commonly known as:  DIOVAN-HCT  Take 1 tablet by mouth daily.     Vitamin D (Ergocalciferol) 50000 UNITS Caps capsule  Commonly known as:  DRISDOL  50,000 Units every 7 (seven) days.     warfarin 5  MG tablet  Commonly known as:  COUMADIN  Take 1 tablet (5 mg total) by mouth one time only at 6 PM.        Discharge Assessment: Filed Vitals:   03/22/14 0920  BP: 149/78  Pulse:   Temp:   Resp:    Skin clean, dry and intact without evidence of skin break down, no evidence of skin tears noted. IV catheter discontinued intact. Site without signs and symptoms of complications - no redness or edema noted at insertion site, patient denies c/o pain - only slight tenderness at site.  Dressing with slight pressure applied.  D/c Instructions-Education: Discharge instructions given to patient/family with verbalized understanding. D/c education completed with patient/family including follow up instructions, medication list, d/c activities limitations if indicated, with other d/c instructions as indicated by MD - patient able to verbalize understanding, all questions fully answered. Patient instructed to return to ED, call 911, or call MD for any changes in condition.  Patient escorted via WC, and D/C home via private auto.  Brooke BonitoKrystal A Daneil Beem, RN 03/22/2014 5:44 PM

## 2014-03-22 NOTE — Progress Notes (Signed)
Physical Therapy Treatment Patient Details Name: Dakota Hall MRN: [id number] DOB: 02/24/1951 Today's Date: 03/22/2014    History of Present Illness Pt adm with massive saddle PE. Pt with recent Lt MCA stroke and had just been dc'd from CIR to SNF.    PT Comments    Pt cont's to present with decreased strength, independence with mobility, & balance.  Requires +2 assist for OOB mobility at this time.     Follow Up Recommendations  SNF     Equipment Recommendations       Recommendations for Other Services       Precautions / Restrictions Precautions Precautions: Fall Precaution Comments: Rt hemiparesis  Restrictions Weight Bearing Restrictions: No    Mobility  Bed Mobility Overal bed mobility: Needs Assistance Bed Mobility: Supine to Sit     Supine to sit: Mod assist     General bed mobility comments: (A) to lift shoulders/trunk to sitting upright.  Cues to hook L LE under RLE to assist with moving to EOB  Transfers Overall transfer level: Needs assistance Equipment used: 2 person hand held assist Transfers: Sit to/from Stand Sit to Stand: +2 physical assistance;Min assist         General transfer comment: Pt does well with lifting hips off bed but requires (A) to shift weight anteriorly & knee extension.    Ambulation/Gait Ambulation/Gait assistance: +2 physical assistance;Mod assist Ambulation Distance (Feet):  (4 steps) Assistive device: 2 person hand held assist       General Gait Details: (A) for balance, to shift weight laterally, blocking of Rt knee when advancing Lt foot, & to position Rt foot due to pt with decreased coordination with placement of foot.     Stairs            Wheelchair Mobility    Modified Rankin (Stroke Patients Only)       Balance           Standing balance support: Single extremity supported Standing balance-Leahy Scale: Poor                      Cognition Arousal/Alertness: Awake/alert Behavior  During Therapy: WFL for tasks assessed/performed                        Exercises      General Comments        Pertinent Vitals/Pain No pain reported.     Home Living                      Prior Function            PT Goals (current goals can now be found in the care plan section) Acute Rehab PT Goals Patient Stated Goal: Get stronger PT Goal Formulation: With patient Time For Goal Achievement: 03/23/14 Potential to Achieve Goals: Good Progress towards PT goals: Progressing toward goals    Frequency  Min 3X/week    PT Plan Current plan remains appropriate    Co-evaluation             End of Session Equipment Utilized During Treatment: Gait belt Activity Tolerance: Patient tolerated treatment well Patient left: in chair;with call bell/phone within reach     Time: 0803-0826 PT Time Calculation (min): 23 min  Charges:  $Therapeutic Activity: 23-37 mins                    G Codes:  Lara MulchKelly Lynn Lashawn Orrego 03/22/2014, 2:14 PM  Verdell FaceKelly Saathvik Every, PTA [phone] 03/22/2014

## 2014-03-22 NOTE — Clinical Social Work Note (Signed)
Per MD [facility] RN, patient, wife, and facility aware of DC. RN given number for report. DC packet on chart. Ambulance transport requested for 2:15pm. CSW signing off.  Roddie McBryant Samari Bittinger MSW, NortonLCSWA, [place], [phone]

## 2014-03-22 NOTE — Discharge Summary (Signed)
Physician Discharge Summary       Patient ID: Dakota Hall MRN: [id number] DOB/AGE: 63/02/1951 63 y.o.  Admit date: 03/14/2014 Discharge date: 03/22/2014  Discharge Diagnoses:  Active Problems:   Pulmonary emboli   Acute massive pulmonary embolism   Malnutrition of moderate degree resolved lactic acidosis/ anion gap acidosis in setting of acute Pulmonary emboli Obstructive sleep apnea History of CVA with right sided functional deficits  Abnormal cardiac enzymes in the setting of acute pulmonary emboli Deconditioning HTN DM GERD  Detailed Hospital Course:    63 year old male, residing at rehab snf S/p recent CVA w/ right sided motor deficits. Was at Rehab facility participating on physical therapy when staff reported O2 sats 74% on RA, placed on 3L and returned 92%. On evaluation in the ER the pt reported shortness of breath during PT with Chest pain First noted on 4/27, was given 2 nitro at that time with relief. Staff did a troponin I at that time, 0.34 results and 12 lead showing ST. 12 lead unremarkable, denies chest pain ON ADMIT. In NAD. She was sent to the ER for further evaluation. Dx eval showed massive saddle embolus w/ evidence or right heart strain. PCCM asked to admit.   Was admitted to the intensive care on IV heparin infusion. We discussed IV TPA in the ER but were concerned as he had already received TPA just recently for his acute CVA. We obtained TEE at bedside which showed Right ventricular dilation, right ventricular strain and evidence of right ventricular failure. Because of this AND his work of breathing we felt he would be a candidate for catheter directed Pulmonary emboli lysis. Interventional radiology was consulted. He underwent successful placement of bilateral Pulmonary artery infusion caths on 4/28 and he was started on a 12 hour TPA infusion. He was monitored in the intensive care. The TPA infusion was completed on 4/29. IV Heparin was resumed 5/1.   Repeat CT  angio on 5/1 showed: >Significantly decreased pulmonary embolic burden with significant reduction in right heart strain and near complete normalization of the RV/LV ratio. His work of breathing had remarkably improved.  He was started on coumadin 5/3.   We asked for hematology consult on 5/4 in effort to help guide future therapy. He was seen by Dr Cyndie ChimeGranfortuna. His recommendations were as follows:   -He could be changed over to therapeutic low molecular weight heparin at this time while we adjust his warfarin.  -Discharge with 3 months of Coumadin (full dose)  -After 3 months when risk of re-thrombosis is decreased he could be considered for alternative target specific oral anticoagulants such as rivaroxaban  -repeat the lupus anticoagulant test when he is off all heparin derivatives   -assess protein S and protein C activity eventually when he comes off warfarin but the chance that he has any of these conditions is extremely low.   Medically we felt that he was stable for discharge back to SNF/rehab facility as of 5/5 with the plan as outlined below.    Discharge Plan by diagnoses   Acute hypoxic respiratory failure in setting of massive B/L pulmonary emboli following recent hospitalization/rehab for acute CVA. [place] weaned To RA PAP is much improved per IR.  -Catheter directed tPA completed 04/29  - LMWH & coumadin overlap  -Coumadin started 5/3 per pharmacy. Will plan for minimum overlap of 5 days w/ 2 consecutive days of INR >2.  -can repeat lupus anticoagulant after d/c and off heparin derivatives  -can  assess Protein S and C activity when off coumadin  -will set him up w/ heme and pulmonary f/u  OSA  Cont home CPAP settings   Elevated CEs in setting of PE decreased clot burden and RV strain nearly resolved by CTA 5/1.  HTN  Continue home antihypertensives  Antiplatelet w/ ASA 81 mg daily. No more plavix   H/o GERD  -GI ppx - Protonix   DM  -SSI  -CBG monitoring  -Carb modified  diet  H/o CVA w/ right sided deficits  -Resume rehab efforts   Significant Hospital tests/ studies/ interventions and procedures  Consults: hematology  SIGNIFICANT EVENTS / STUDIES:  CT chest 4/28: Massive bilateral pulmonary emboli with saddle embolus and evidence of right heart strain/failure.  ECHO 4/28 >>> EF 55-60%, RV hemodynamic compromise from probable large saddle pulmonary embolus. There is thrombus in transit in the RA.  LE Doppler US 4/28 >>> acute DVT throughout popliteal vein; age indeterminate DVT throughout posterior tibial vein; acute SVT involving proximal segment of lesser saphenous vein.  4/28: Successful placement of bilateral PA infusion caths for TPA LYSIS CT chest 05/1>>>Significantly decreased pulmonary embolic burden with significant reduction in right heart strain and near complete normalization of the RV/LV ratio   Discharge Exam: BP 149/78  Pulse 42  Temp(Src) 97.8 F (36.6 C) (Oral)  Resp 18  Ht 5\' 8"  (1.727 m)  Wt 225 lb 12 oz (102.4 kg)  BMI 34.33 kg/m2  SpO2 96%  General: In NAD  Neuro: AAO x 3, responding appropriately  HEENT: NCAT, no JVD noted.  Cardiovascular: RRR  Lungs: Clear, no w/r/r. Even, unlabored  Abdomen: + BS, soft, NT, ND  Musculoskeletal: Residual right sided weakness, RUE > RLE  Skin: Intact    Labs at discharge Lab Results  Component Value Date   CREATININE 0.98 03/21/2014   BUN 20 03/21/2014   NA 141 03/21/2014   K 3.8 03/21/2014   CL 103 03/21/2014   CO2 23 03/21/2014   Lab Results  Component Value Date   WBC 11.9* 03/22/2014   HGB 13.8 03/22/2014   HCT 40.1 03/22/2014   MCV 87.2 03/22/2014   PLT 271 03/22/2014   Lab Results  Component Value Date   ALT 44 02/09/2014   AST 35 02/09/2014   ALKPHOS 56 02/09/2014   BILITOT 0.6 02/09/2014   Lab Results  Component Value Date   INR 2.32* 03/22/2014   INR 1.74* 03/21/2014   INR 1.16 03/20/2014    Current radiology studies No results found.  Disposition:  03-Skilled Nursing  Facility      Discharge Orders   Future Appointments Provider Department Dept Phone   04/07/2014 12:30 PM Erick Colace, MD [facility] [phone]   05/03/2014 2:00 PM Kalman Shan, MD [facility] [phone]   07/27/2014 2:00 PM Nilda Riggs, NP [facility] [phone]   Future Orders Complete By Expires   Diet - low sodium heart healthy  As directed    Increase activity slowly  As directed        Medication List    STOP taking these medications       clopidogrel 75 MG tablet  Commonly known as:  PLAVIX     LORazepam 0.5 MG tablet  Commonly known as:  ATIVAN     potassium chloride 10 MEQ tablet  Commonly known as:  K-DUR,KLOR-CON      TAKE these medications       amLODipine 10 MG tablet  Commonly known as:  NORVASC  Take 10 mg by mouth daily.     ARIPiprazole 2 MG tablet  Commonly known as:  ABILIFY  Take 2 mg by mouth daily.     aspirin EC 81 MG tablet  Take 1 tablet (81 mg total) by mouth daily.     baclofen 20 MG tablet  Commonly known as:  LIORESAL  Take 1 tablet (20 mg total) by mouth 3 (three) times daily.     bisoprolol 5 MG tablet  Commonly known as:  ZEBETA  Take 1 tablet (5 mg total) by mouth daily.     enoxaparin 100 MG/ML injection  Commonly known as:  LOVENOX  Inject 1 mL (100 mg total) into the skin every 12 (twelve) hours.     feeding supplement (ENSURE COMPLETE) Liqd  Take 237 mLs by mouth 2 (two) times daily before lunch and supper.     insulin aspart 100 UNIT/ML injection  Commonly known as:  novoLOG  - Inject 2-12 Units into the skin 3 (three) times daily before meals. BCG:  - 151-200= 2 units  - 201-250= 4 units  - 251-300= 6 units  - 301-350= 8 units  - 351-400= 10 units  - 401 > 12 units     metFORMIN 500 MG tablet  Commonly known as:  GLUCOPHAGE  Take 1,000 mg by mouth daily.     NONFORMULARY OR COMPOUNDED ITEM  - 1) PLEASE CHECK DAILY INR and  adjust Coumadin for goal INR of 2-3  - 2) continue Lovenox every 12 hours thru 5/8 (minimum) AND also must have 2 consecutive days of therapeutic INR > 2 before stopping     pantoprazole 40 MG tablet  Commonly known as:  PROTONIX  Take 1 tablet (40 mg total) by mouth at bedtime.     pioglitazone 15 MG tablet  Commonly known as:  ACTOS  Take 15 mg by mouth daily.     polyethylene glycol packet  Commonly known as:  MIRALAX / GLYCOLAX  Take 17 g by mouth daily.     sertraline 100 MG tablet  Commonly known as:  ZOLOFT  Take 300 mg by mouth daily.     simvastatin 20 MG tablet  Commonly known as:  ZOCOR  Take 1 tablet (20 mg total) by mouth daily at 6 PM.     traZODone 50 MG tablet  Commonly known as:  DESYREL  Take 50 mg by mouth daily as needed for sleep. For sleep     valsartan-hydrochlorothiazide 160-12.5 MG per tablet  Commonly known as:  DIOVAN-HCT  Take 1 tablet by mouth daily.     Vitamin D (Ergocalciferol) 50000 UNITS Caps capsule  Commonly known as:  DRISDOL  50,000 Units every 7 (seven) days.     warfarin 5 MG tablet  Commonly known as:  COUMADIN  Take 1 tablet (5 mg total) by mouth one time only at 6 PM.       Follow-up Information   Follow up with [facility], MD On 05/03/2014. (2pm )    Specialty:  Pulmonary Disease   Contact information:   [address] [place] [address] [phone]       Follow up with Dr Cyndie Chime  On 03/21/2014. (Dr Patsy Lager office will contact you )    Contact information:   [facility] [phone]  ask for Tyler Aas     [phone]         Discharged Condition:  good  Physician Statement:   The Patient was personally examined, the discharge assessment and plan has been personally reviewed and I agree with ACNP Babcock's assessment and plan. > 30 minutes of time have been dedicated to discharge assessment, planning and discharge instructions.   Signed: Alyson ReedyWesam G  Kortni Hasten 03/22/2014, 12:38 PM

## 2014-04-07 ENCOUNTER — Inpatient Hospital Stay: Payer: Managed Care, Other (non HMO) | Admitting: Physical Medicine & Rehabilitation

## 2014-04-22 ENCOUNTER — Emergency Department (HOSPITAL_COMMUNITY): Payer: Managed Care, Other (non HMO)

## 2014-04-22 ENCOUNTER — Inpatient Hospital Stay (HOSPITAL_COMMUNITY): Payer: Managed Care, Other (non HMO)

## 2014-04-22 ENCOUNTER — Inpatient Hospital Stay (HOSPITAL_COMMUNITY)
Admission: EM | Admit: 2014-04-22 | Discharge: 2014-04-27 | DRG: 071 | Disposition: A | Discharge status: Home Health | Payer: Managed Care, Other (non HMO) | Attending: Internal Medicine | Admitting: Internal Medicine

## 2014-04-22 ENCOUNTER — Encounter (HOSPITAL_COMMUNITY): Payer: Self-pay | Admitting: Emergency Medicine

## 2014-04-22 DIAGNOSIS — R4781 Slurred speech: Secondary | ICD-10-CM

## 2014-04-22 DIAGNOSIS — E785 Hyperlipidemia, unspecified: Secondary | ICD-10-CM | POA: Diagnosis present

## 2014-04-22 DIAGNOSIS — I639 Cerebral infarction, unspecified: Secondary | ICD-10-CM | POA: Diagnosis present

## 2014-04-22 DIAGNOSIS — G4733 Obstructive sleep apnea (adult) (pediatric): Secondary | ICD-10-CM | POA: Diagnosis present

## 2014-04-22 DIAGNOSIS — Z79899 Other long term (current) drug therapy: Secondary | ICD-10-CM

## 2014-04-22 DIAGNOSIS — R5381 Other malaise: Secondary | ICD-10-CM | POA: Diagnosis present

## 2014-04-22 DIAGNOSIS — E669 Obesity, unspecified: Secondary | ICD-10-CM | POA: Diagnosis present

## 2014-04-22 DIAGNOSIS — I2699 Other pulmonary embolism without acute cor pulmonale: Secondary | ICD-10-CM

## 2014-04-22 DIAGNOSIS — F329 Major depressive disorder, single episode, unspecified: Secondary | ICD-10-CM | POA: Diagnosis present

## 2014-04-22 DIAGNOSIS — F3289 Other specified depressive episodes: Secondary | ICD-10-CM | POA: Diagnosis present

## 2014-04-22 DIAGNOSIS — E440 Moderate protein-calorie malnutrition: Secondary | ICD-10-CM

## 2014-04-22 DIAGNOSIS — Z86711 Personal history of pulmonary embolism: Secondary | ICD-10-CM

## 2014-04-22 DIAGNOSIS — Z7982 Long term (current) use of aspirin: Secondary | ICD-10-CM

## 2014-04-22 DIAGNOSIS — I498 Other specified cardiac arrhythmias: Secondary | ICD-10-CM | POA: Diagnosis present

## 2014-04-22 DIAGNOSIS — Z7901 Long term (current) use of anticoagulants: Secondary | ICD-10-CM

## 2014-04-22 DIAGNOSIS — F418 Other specified anxiety disorders: Secondary | ICD-10-CM

## 2014-04-22 DIAGNOSIS — R4789 Other speech disturbances: Secondary | ICD-10-CM

## 2014-04-22 DIAGNOSIS — I10 Essential (primary) hypertension: Secondary | ICD-10-CM | POA: Diagnosis present

## 2014-04-22 DIAGNOSIS — E119 Type 2 diabetes mellitus without complications: Secondary | ICD-10-CM | POA: Diagnosis present

## 2014-04-22 DIAGNOSIS — I69959 Hemiplegia and hemiparesis following unspecified cerebrovascular disease affecting unspecified side: Secondary | ICD-10-CM

## 2014-04-22 DIAGNOSIS — R001 Bradycardia, unspecified: Secondary | ICD-10-CM

## 2014-04-22 DIAGNOSIS — I69922 Dysarthria following unspecified cerebrovascular disease: Secondary | ICD-10-CM

## 2014-04-22 DIAGNOSIS — R4701 Aphasia: Secondary | ICD-10-CM | POA: Diagnosis present

## 2014-04-22 DIAGNOSIS — G9341 Metabolic encephalopathy: Principal | ICD-10-CM | POA: Diagnosis present

## 2014-04-22 DIAGNOSIS — N179 Acute kidney failure, unspecified: Secondary | ICD-10-CM | POA: Diagnosis present

## 2014-04-22 DIAGNOSIS — G9340 Encephalopathy, unspecified: Secondary | ICD-10-CM

## 2014-04-22 HISTORY — DX: Other pulmonary embolism without acute cor pulmonale: I26.99

## 2014-04-22 LAB — I-STAT TROPONIN, ED: TROPONIN I, POC: 0 ng/mL (ref 0.00–0.08)

## 2014-04-22 LAB — COMPREHENSIVE METABOLIC PANEL
ALBUMIN: 3.8 g/dL (ref 3.5–5.2)
ALT: 15 U/L (ref 0–53)
AST: 18 U/L (ref 0–37)
Alkaline Phosphatase: 70 U/L (ref 39–117)
BILIRUBIN TOTAL: 0.4 mg/dL (ref 0.3–1.2)
BUN: 59 mg/dL — AB (ref 6–23)
CO2: 27 meq/L (ref 19–32)
Calcium: 10.2 mg/dL (ref 8.4–10.5)
Chloride: 99 mEq/L (ref 96–112)
Creatinine, Ser: 3.44 mg/dL — ABNORMAL HIGH (ref 0.50–1.35)
GFR calc Af Amer: 20 mL/min — ABNORMAL LOW (ref >90)
GFR, EST NON AFRICAN AMERICAN: 18 mL/min — AB (ref >90)
Glucose, Bld: 128 mg/dL — ABNORMAL HIGH (ref 70–99)
Potassium: 3.5 mEq/L — ABNORMAL LOW (ref 3.7–5.3)
Sodium: 141 mEq/L (ref 137–147)
Total Protein: 7.6 g/dL (ref 6.0–8.3)

## 2014-04-22 LAB — I-STAT ARTERIAL BLOOD GAS, ED
Acid-Base Excess: 2 mmol/L (ref 0.0–2.0)
BICARBONATE: 25.2 meq/L — AB (ref 20.0–24.0)
O2 Saturation: 96 %
PH ART: 7.46 — AB (ref 7.350–7.450)
PO2 ART: 77 mmHg — AB (ref 80.0–100.0)
TCO2: 26 mmol/L (ref 0–100)
pCO2 arterial: 35.5 mmHg (ref 35.0–45.0)

## 2014-04-22 LAB — APTT: aPTT: 55 seconds — ABNORMAL HIGH (ref 24–37)

## 2014-04-22 LAB — I-STAT CHEM 8, ED
BUN: 52 mg/dL — AB (ref 6–23)
CALCIUM ION: 1.22 mmol/L (ref 1.13–1.30)
Chloride: 99 mEq/L (ref 96–112)
Creatinine, Ser: 3.7 mg/dL — ABNORMAL HIGH (ref 0.50–1.35)
Glucose, Bld: 127 mg/dL — ABNORMAL HIGH (ref 70–99)
HEMATOCRIT: 45 % (ref 39.0–52.0)
Hemoglobin: 15.3 g/dL (ref 13.0–17.0)
Potassium: 3.4 mEq/L — ABNORMAL LOW (ref 3.7–5.3)
Sodium: 145 mEq/L (ref 137–147)
TCO2: 28 mmol/L (ref 0–100)

## 2014-04-22 LAB — URINALYSIS, ROUTINE W REFLEX MICROSCOPIC
GLUCOSE, UA: NEGATIVE mg/dL
HGB URINE DIPSTICK: NEGATIVE
Ketones, ur: 15 mg/dL — AB
Nitrite: NEGATIVE
Protein, ur: 30 mg/dL — AB
SPECIFIC GRAVITY, URINE: 1.024 (ref 1.005–1.030)
Urobilinogen, UA: 1 mg/dL (ref 0.0–1.0)
pH: 5 (ref 5.0–8.0)

## 2014-04-22 LAB — CBC
HEMATOCRIT: 41.8 % (ref 39.0–52.0)
Hemoglobin: 14.2 g/dL (ref 13.0–17.0)
MCH: 29.5 pg (ref 26.0–34.0)
MCHC: 34 g/dL (ref 30.0–36.0)
MCV: 86.9 fL (ref 78.0–100.0)
Platelets: 174 10*3/uL (ref 150–400)
RBC: 4.81 MIL/uL (ref 4.22–5.81)
RDW: 13.4 % (ref 11.5–15.5)
WBC: 9 10*3/uL (ref 4.0–10.5)

## 2014-04-22 LAB — ETHANOL

## 2014-04-22 LAB — URINE MICROSCOPIC-ADD ON

## 2014-04-22 LAB — DIFFERENTIAL
BASOS PCT: 0 % (ref 0–1)
Basophils Absolute: 0 10*3/uL (ref 0.0–0.1)
Eosinophils Absolute: 0.2 10*3/uL (ref 0.0–0.7)
Eosinophils Relative: 3 % (ref 0–5)
Lymphocytes Relative: 32 % (ref 12–46)
Lymphs Abs: 2.8 10*3/uL (ref 0.7–4.0)
MONO ABS: 0.6 10*3/uL (ref 0.1–1.0)
MONOS PCT: 6 % (ref 3–12)
NEUTROS ABS: 5.4 10*3/uL (ref 1.7–7.7)
Neutrophils Relative %: 59 % (ref 43–77)

## 2014-04-22 LAB — RAPID URINE DRUG SCREEN, HOSP PERFORMED
Amphetamines: NOT DETECTED
Barbiturates: NOT DETECTED
Benzodiazepines: NOT DETECTED
COCAINE: NOT DETECTED
OPIATES: NOT DETECTED
Tetrahydrocannabinol: NOT DETECTED

## 2014-04-22 LAB — PROTIME-INR
INR: 4.1 — ABNORMAL HIGH (ref 0.00–1.49)
Prothrombin Time: 38.2 seconds — ABNORMAL HIGH (ref 11.6–15.2)

## 2014-04-22 LAB — GLUCOSE, CAPILLARY: Glucose-Capillary: 105 mg/dL — ABNORMAL HIGH (ref 70–99)

## 2014-04-22 LAB — TSH: TSH: 3.84 u[IU]/mL (ref 0.350–4.500)

## 2014-04-22 LAB — MAGNESIUM: MAGNESIUM: 2.1 mg/dL (ref 1.5–2.5)

## 2014-04-22 MED ORDER — DOCUSATE SODIUM 100 MG PO CAPS
100.0000 mg | ORAL_CAPSULE | Freq: Every day | ORAL | Status: DC | PRN
  Administered 2014-04-24: 100 mg via ORAL
  Filled 2014-04-22: qty 1

## 2014-04-22 MED ORDER — ASPIRIN EC 81 MG PO TBEC
81.0000 mg | DELAYED_RELEASE_TABLET | Freq: Every day | ORAL | Status: DC
  Administered 2014-04-22 – 2014-04-27 (×6): 81 mg via ORAL
  Filled 2014-04-22 (×6): qty 1

## 2014-04-22 MED ORDER — PANTOPRAZOLE SODIUM 40 MG PO TBEC
40.0000 mg | DELAYED_RELEASE_TABLET | Freq: Every day | ORAL | Status: DC
  Administered 2014-04-23 – 2014-04-26 (×4): 40 mg via ORAL
  Filled 2014-04-22 (×4): qty 1

## 2014-04-22 MED ORDER — INSULIN ASPART 100 UNIT/ML ~~LOC~~ SOLN
0.0000 [IU] | Freq: Three times a day (TID) | SUBCUTANEOUS | Status: DC
  Administered 2014-04-25 – 2014-04-26 (×3): 1 [IU] via SUBCUTANEOUS
  Administered 2014-04-26: 2 [IU] via SUBCUTANEOUS

## 2014-04-22 MED ORDER — SENNOSIDES-DOCUSATE SODIUM 8.6-50 MG PO TABS
1.0000 | ORAL_TABLET | Freq: Every evening | ORAL | Status: DC | PRN
  Administered 2014-04-24: 1 via ORAL
  Filled 2014-04-22: qty 1

## 2014-04-22 MED ORDER — WARFARIN - PHARMACIST DOSING INPATIENT: Freq: Every day | Status: DC

## 2014-04-22 MED ORDER — SODIUM CHLORIDE 0.9 % IV BOLUS (SEPSIS): 1000.0000 mL | Freq: Once | INTRAVENOUS | Status: DC

## 2014-04-22 MED ORDER — SERTRALINE HCL 50 MG PO TABS
250.0000 mg | ORAL_TABLET | Freq: Every day | ORAL | Status: DC
  Administered 2014-04-22 – 2014-04-26 (×5): 250 mg via ORAL
  Filled 2014-04-22 (×6): qty 1

## 2014-04-22 MED ORDER — SIMVASTATIN 20 MG PO TABS
20.0000 mg | ORAL_TABLET | Freq: Every day | ORAL | Status: DC
  Administered 2014-04-22 – 2014-04-26 (×5): 20 mg via ORAL
  Filled 2014-04-22 (×7): qty 1

## 2014-04-22 MED ORDER — SODIUM CHLORIDE 0.9 % IV SOLN
INTRAVENOUS | Status: DC
  Administered 2014-04-22 – 2014-04-25 (×4): via INTRAVENOUS

## 2014-04-22 MED ORDER — POTASSIUM CHLORIDE CRYS ER 20 MEQ PO TBCR
20.0000 meq | EXTENDED_RELEASE_TABLET | Freq: Once | ORAL | Status: AC
  Administered 2014-04-22: 20 meq via ORAL
  Filled 2014-04-22: qty 1

## 2014-04-22 MED ORDER — BACLOFEN 20 MG PO TABS
20.0000 mg | ORAL_TABLET | Freq: Three times a day (TID) | ORAL | Status: DC | PRN
Start: 2014-04-22 — End: 2014-04-27
  Administered 2014-04-23: 20 mg via ORAL
  Filled 2014-04-22 (×2): qty 1

## 2014-04-22 NOTE — ED Provider Notes (Signed)
CSN: [id number]     Arrival date & time 04/22/14  1130 History   First MD Initiated Contact with Patient 04/22/14 1234     Chief Complaint  Patient presents with  . Transient Ischemic Attack     (Consider location/radiation/quality/duration/timing/severity/associated sxs/prior Treatment) HPI Comments: Patient is a 63 year old male with history of prior stroke, diabetes, hypertension, pulmonary emboli who presents today after  an episode of slurred speech. His wife is in the room and gives most of the history. She reports that she woke him up this morning to find him with slurred speech. The wife denies any facial droop. She called EMS out which time the symptoms resolved. He has been having intermittent episodes of slurred speech. She cannot say how long the for because he falls asleep. The patient has right-sided paralysis from the stroke in March. Currently she does not feel as though this is any worse than his baseline. He has had prior pulmonary embolism. He was on Coumadin therapy with a supratherapeutic INR on a Wednesday. He stopped taking his Coumadin when this was discovered Wednesday. He took a dose of Coumadin yesterday. He was unable to take any of his medications other than Protonix this morning. The patient lives at home with his family. Patient is bradycardic, but this is normal for him.  The history is provided by the patient. No language interpreter was used.    Past Medical History  Diagnosis Date  . Diabetes mellitus   . Hypertension   . Stroke July 2013    left paramedian pontine, incidental right parietal subcortical. both d/t small vessel disease  . Stroke March 2015    left basal ganglia secondary to small vessel disease  . Obesity   . Depression with anxiety   . OSA on CPAP   . Pulmonary emboli 02/2014   Past Surgical History  Procedure Laterality Date  . Tonsillectomy    . Anal fissure repair     Family History  Problem Relation Age of Onset  . Cancer Mother    . Heart disease Father    History  Substance Use Topics  . Smoking status: Never Smoker   . Smokeless tobacco: Never Used  . Alcohol Use: No    Review of Systems  Constitutional: Positive for fatigue. Negative for fever and chills.  Respiratory: Negative for shortness of breath.   Cardiovascular: Negative for chest pain.  Gastrointestinal: Negative for nausea, vomiting and abdominal pain.  Neurological: Positive for speech difficulty and weakness.  All other systems reviewed and are negative.     Allergies  Penicillins  Home Medications   Prior to Admission medications   Medication Sig Start Date End Date Taking? Authorizing Provider  amLODipine (NORVASC) 10 MG tablet Take 10 mg by mouth daily.   Yes Historical Provider, MD  ARIPiprazole (ABILIFY) 5 MG tablet Take 2.5 mg by mouth daily.   Yes Historical Provider, MD  aspirin EC 81 MG tablet Take 1 tablet (81 mg total) by mouth daily. 03/21/14  Yes Simonne MartinetPeter E Babcock, NP  baclofen (LIORESAL) 20 MG tablet Take 1 tablet (20 mg total) by mouth 3 (three) times daily. 03/03/14  Yes Evlyn KannerPamela S Love, PA-C  bisoprolol (ZEBETA) 5 MG tablet Take 1 tablet (5 mg total) by mouth daily. 03/21/14  Yes Simonne MartinetPeter E Babcock, NP  docusate sodium (COLACE) 100 MG capsule Take 100 mg by mouth daily as needed for mild constipation.   Yes Historical Provider, MD  feeding supplement, ENSURE COMPLETE, (ENSURE COMPLETE) LIQD  Take 237 mLs by mouth 2 (two) times daily before lunch and supper. 03/21/14  Yes Simonne Martinet, NP  metFORMIN (GLUCOPHAGE) 500 MG tablet Take 1,000 mg by mouth daily.   Yes Historical Provider, MD  NONFORMULARY OR COMPOUNDED ITEM 1) PLEASE CHECK DAILY INR and adjust Coumadin for goal INR of 2-3 2) continue Lovenox every 12 hours thru 5/8 (minimum) AND also must have 2 consecutive days of therapeutic INR > 2 before stopping 03/21/14  Yes Simonne Martinet, NP  pantoprazole (PROTONIX) 40 MG tablet Take 1 tablet (40 mg total) by mouth at bedtime. 03/03/14   Yes Evlyn Kanner Love, PA-C  pioglitazone (ACTOS) 15 MG tablet Take 15 mg by mouth daily.   Yes Historical Provider, MD  polyethylene glycol (MIRALAX / GLYCOLAX) packet Take 17 g by mouth daily as needed for mild constipation.   Yes Historical Provider, MD  sertraline (ZOLOFT) 100 MG tablet Take 300 mg by mouth daily.    Yes Historical Provider, MD  simvastatin (ZOCOR) 20 MG tablet Take 1 tablet (20 mg total) by mouth daily at 6 PM. 05/18/12  Yes Calvert Cantor, MD  valsartan-hydrochlorothiazide (DIOVAN-HCT) 160-12.5 MG per tablet Take 1 tablet by mouth daily.    Yes Historical Provider, MD  Vitamin D, Ergocalciferol, (DRISDOL) 50000 UNITS CAPS capsule 50,000 Units every 7 (seven) days.  06/26/13  Yes Historical Provider, MD  warfarin (COUMADIN) 2.5 MG tablet Take 2.5 mg by mouth daily.   Yes Historical Provider, MD   BP 152/69  Pulse 41  Temp(Src) 97.9 F (36.6 C) (Oral)  Resp 18  SpO2 98% Physical Exam  Nursing note and vitals reviewed. Constitutional: He is oriented to person, place, and time. He appears well-developed and well-nourished. He appears ill. No distress.  HENT:  Head: Normocephalic and atraumatic.  Right Ear: External ear normal.  Left Ear: External ear normal.  Nose: Nose normal.  Mouth/Throat: Mucous membranes are dry.  Eyes: Conjunctivae and EOM are normal. Pupils are equal, round, and reactive to light.  Neck: Normal range of motion. No tracheal deviation present.  Cardiovascular: Normal rate, regular rhythm, normal heart sounds, intact distal pulses and normal pulses.   Pulses:      Radial pulses are 2+ on the right side, and 2+ on the left side.       Posterior tibial pulses are 2+ on the right side, and 2+ on the left side.  Pulmonary/Chest: Effort normal and breath sounds normal. No stridor.  Abdominal: Soft. He exhibits no distension. There is no tenderness.  Musculoskeletal: Normal range of motion.  Significant weakness to extremities on right  Neurological: He is  alert and oriented to person, place, and time.  No facial droop. Right sided weakness. Can lift leg with significant effort.   Skin: Skin is warm and dry. He is not diaphoretic.  Psychiatric: He has a normal mood and affect. His behavior is normal.    ED Course  Procedures (including critical care time) Labs Review Labs Reviewed  PROTIME-INR - Abnormal; Notable for the following:    Prothrombin Time 38.2 (*)    INR 4.10 (*)    All other components within normal limits  APTT - Abnormal; Notable for the following:    aPTT 55 (*)    All other components within normal limits  COMPREHENSIVE METABOLIC PANEL - Abnormal; Notable for the following:    Potassium 3.5 (*)    Glucose, Bld 128 (*)    BUN 59 (*)    Creatinine,  Ser 3.44 (*)    GFR calc non Af Amer 18 (*)    GFR calc Af Amer 20 (*)    All other components within normal limits  URINALYSIS, ROUTINE W REFLEX MICROSCOPIC - Abnormal; Notable for the following:    Color, Urine AMBER (*)    APPearance CLOUDY (*)    Bilirubin Urine MODERATE (*)    Ketones, ur 15 (*)    Protein, ur 30 (*)    Leukocytes, UA SMALL (*)    All other components within normal limits  URINE MICROSCOPIC-ADD ON - Abnormal; Notable for the following:    Casts HYALINE CASTS (*)    All other components within normal limits  I-STAT CHEM 8, ED - Abnormal; Notable for the following:    Potassium 3.4 (*)    BUN 52 (*)    Creatinine, Ser 3.70 (*)    Glucose, Bld 127 (*)    All other components within normal limits  ETHANOL  CBC  DIFFERENTIAL  URINE RAPID DRUG SCREEN (HOSP PERFORMED)  I-STAT TROPOININ, ED  I-STAT TROPOININ, ED    Imaging Review Ct Head Wo Contrast  04/22/2014   CLINICAL DATA:  Altered mental status and facial droop  EXAM: CT HEAD WITHOUT CONTRAST  TECHNIQUE: Contiguous axial images were obtained from the base of the skull through the vertex without intravenous contrast. Study was obtained within 24 hr of patient's arrival at the emergency  department.  COMPARISON:  March 14, 2014  FINDINGS: Mild generalized atrophy is stable. There is no demonstrable mass, hemorrhage, extra-axial fluid collection, or midline shift. There is small vessel disease throughout the centra semiovale bilaterally, stable. There is also small vessel disease in the anterior left pons, stable. There is a subacute infarct in the posterior left lentiform nucleus region, showing evidence of evolution compared to the most recent prior study. No new/acute appearing infarct is seen on the current examination. Bony calvarium appears intact. The mastoid air cells are clear.  IMPRESSION: Evidence of further evolution of a subacute infarct in the posterior left lentiform nucleus. There is atrophy with extensive small vessel disease in the anterior left pons and supratentorial white matter bilaterally. No acute infarct apparent. No hemorrhage or mass effect. 1   Electronically Signed   By: Bretta Bang M.D.   On: 04/22/2014 14:41     EKG Interpretation   Date/Time:  Saturday April 22 2014 14:30:40 EDT Ventricular Rate:  48 PR Interval:  177 QRS Duration: 100 QT Interval:  470 QTC Calculation: 420 R Axis:   48 Text Interpretation:  Sinus bradycardia Borderline low voltage, extremity  leads Since previous tracing rate slower Confirmed by Karma Ganja  MD, MARTHA  [phone]) on 04/22/2014 4:37:14 PM      MDM   Final diagnoses:  Slurred speech    Patient presents to ED for evaluation of slurred speech. He currently does not have slurred speech. He is somnolent. Patient without facial droop. Neurology has evaluated the patient. MRI ordered. Will admit patient to medicine for further evaluation. Patient with significant AKI likely due to dehydration. Patient appears very dry.Patient also with supra therapeutic INR. Patient is hemodynamically stable at this time.  Discussed case with Dr. Karma Ganja who agrees with plan. Patient / Family / Caregiver informed of clinical course,  understand medical decision-making process, and agree with plan.     Mora Bellman, PA-C 04/22/14 1910

## 2014-04-22 NOTE — Progress Notes (Signed)
ANTICOAGULATION CONSULT NOTE - Initial Consult  Pharmacy Consult for warfarin Indication: VTE treatment  Allergies  Allergen Reactions  . Penicillins Hives    Patient Measurements:     Vital Signs: Temp: 97.9 F (36.6 C) (06/06 1157) Temp src: Oral (06/06 1157) BP: 129/69 mmHg (06/06 1800) Pulse Rate: 39 (06/06 1800)  Labs:  Recent Labs  04/22/14 1405 04/22/14 1436  HGB 14.2 15.3  HCT 41.8 45.0  PLT 174  --   APTT 55*  --   LABPROT 38.2*  --   INR 4.10*  --   CREATININE 3.44* 3.70*    The CrCl is unknown because both a height and weight (above a minimum accepted value) are required for this calculation.   Medical History: Past Medical History  Diagnosis Date  . Diabetes mellitus   . Hypertension   . Stroke July 2013    left paramedian pontine, incidental right parietal subcortical. both d/t small vessel disease  . Stroke March 2015    left basal ganglia secondary to small vessel disease  . Obesity   . Depression with anxiety   . OSA on CPAP   . Pulmonary emboli 02/2014    Assessment: 82 YOM with history of multiple strokes d/t small vessel disease. He was diagnosed with PE in April of this year and was started on warfarin. Earlier this week, INR was elevated and he was instructed to hold his doses. Today INR is still elevated at 4.1. PTA dose is 2.5mg  daily per history. Hgb 15.3, platelets 174. No bleeding noted.  Goal of Therapy:  INR 2-3 Monitor platelets by anticoagulation protocol: Yes   Plan:  1. Hold warfarin tonight given elevated INR 2. Daily PT/INR and resume as appropriate 3. Follow for s/s bleeding and any need for reversal  Jimie Kuwahara D. Pearlena Ow, PharmD, BCPS Clinical Pharmacist Pager: [phone] 04/22/2014 7:06 PM

## 2014-04-22 NOTE — ED Notes (Signed)
md at bedside

## 2014-04-22 NOTE — ED Notes (Signed)
Pt has had no changes since  I first took over. Unable to assess right sided arm and leg due to previous stroke defects from March. Pt speech has been appropriate and clear but hard to understand, pt family understands pt speech and states that is his new normal post stroke. Pt remains alert and oriented, pleasant and appropriate. Vitals remain WNL. Phlebotomy at bedside.

## 2014-04-22 NOTE — ED Notes (Signed)
Attempt to call report.

## 2014-04-22 NOTE — ED Notes (Signed)
Patient transported to CT via stretcher.

## 2014-04-22 NOTE — ED Provider Notes (Signed)
Medical screening examination/treatment/procedure(s) were performed by non-physician practitioner and as supervising physician I was immediately available for consultation/collaboration.   EKG Interpretation   Date/Time:  Saturday April 22 2014 14:30:40 EDT Ventricular Rate:  48 PR Interval:  177 QRS Duration: 100 QT Interval:  470 QTC Calculation: 420 R Axis:   48 Text Interpretation:  Sinus bradycardia Borderline low voltage, extremity  leads Since previous tracing rate slower Confirmed by Karma Ganja  MD, Gwynevere Lizana  [phone]) on 04/22/2014 4:37:14 PM       Ethelda Chick, MD 04/22/14 1910

## 2014-04-22 NOTE — Progress Notes (Signed)
Pt.'s family members are at the bedside & have refused CPAP for the pt. Tonight stating that he doesn't have his nasal pillows. Pt.'s wife states that pt. Will rip nasal mask off if we try it. The pt.'s family stated that they will bring his nasal pillows in tomorrow. RN is aware.

## 2014-04-22 NOTE — ED Notes (Signed)
Patient with family this morning and started to have stroke like symptoms including facial droop, etc and Altered Mental Status, this all resolved at 1000 when EMS arrived to transport patient to ER.  Patient had recent stroke in March 2015 and PE in April of 2015.  He has right sided paralysis from CVA in March.  Patient was alert and oriented with EMS and no complaints at this time.  Patient lives at home with family.  EMS reports Bradycardia 40s

## 2014-04-22 NOTE — H&P (Signed)
[facility] History and Physical  Dakota Hall VAP:[id number] DOB: 09-26-1951 DOA: 04/22/2014  Referring physician: ED, PA.  PCP: Darnelle Bos, MD   Chief Complaint: slurred speech.   HPI: Dakota Hall is a 63 y.o. male with PMH significant for Stroke , Diabetes, history of massive PE S/P infusion Catheter TPA, on coumadin, OSA on CPAP who was notice to have worsening slurred speech this morning day of admission. Patient wife relates that she has notice slurred speech intermittent over last 2 weeks, worse this morning. Also she has notice difficulty finding words and he has been more sleepy. He has had decrease oral intake since the stroke but worse over last few days. He has been complaining of metal taste. He is lethargic but wake up to answer questions. He denies chest pain, dyspnea. He was able to name objects showed by me. No history of vomiting or diarrhea. He denies abdominal pain.    Review of Systems:  Negative, except as per HPI.   Past Medical History  Diagnosis Date  . Diabetes mellitus   . Hypertension   . Stroke July 2013    left paramedian pontine, incidental right parietal subcortical. both d/t small vessel disease  . Stroke March 2015    left basal ganglia secondary to small vessel disease  . Obesity   . Depression with anxiety   . OSA on CPAP   . Pulmonary emboli 02/2014   Past Surgical History  Procedure Laterality Date  . Tonsillectomy    . Anal fissure repair     Social History:  reports that he has never smoked. He has never used smokeless tobacco. He reports that he does not drink alcohol or use illicit drugs.  Allergies  Allergen Reactions  . Penicillins Hives    Family History  Problem Relation Age of Onset  . Cancer Mother   . Heart disease Father      Prior to Admission medications   Medication Sig Start Date End Date Taking? Authorizing Provider  amLODipine (NORVASC) 10 MG tablet Take 10 mg by mouth daily.   Yes Historical  Provider, MD  ARIPiprazole (ABILIFY) 5 MG tablet Take 2.5 mg by mouth daily.   Yes Historical Provider, MD  aspirin EC 81 MG tablet Take 1 tablet (81 mg total) by mouth daily. 03/21/14  Yes Simonne Martinet, NP  baclofen (LIORESAL) 20 MG tablet Take 1 tablet (20 mg total) by mouth 3 (three) times daily. 03/03/14  Yes Evlyn Kanner Love, PA-C  bisoprolol (ZEBETA) 5 MG tablet Take 1 tablet (5 mg total) by mouth daily. 03/21/14  Yes Simonne Martinet, NP  docusate sodium (COLACE) 100 MG capsule Take 100 mg by mouth daily as needed for mild constipation.   Yes Historical Provider, MD  feeding supplement, ENSURE COMPLETE, (ENSURE COMPLETE) LIQD Take 237 mLs by mouth 2 (two) times daily before lunch and supper. 03/21/14  Yes Simonne Martinet, NP  metFORMIN (GLUCOPHAGE) 500 MG tablet Take 1,000 mg by mouth daily.   Yes Historical Provider, MD  NONFORMULARY OR COMPOUNDED ITEM 1) PLEASE CHECK DAILY INR and adjust Coumadin for goal INR of 2-3 2) continue Lovenox every 12 hours thru 5/8 (minimum) AND also must have 2 consecutive days of therapeutic INR > 2 before stopping 03/21/14  Yes Simonne Martinet, NP  pantoprazole (PROTONIX) 40 MG tablet Take 1 tablet (40 mg total) by mouth at bedtime. 03/03/14  Yes Evlyn Kanner Love, PA-C  pioglitazone (ACTOS) 15 MG tablet Take 15  mg by mouth daily.   Yes Historical Provider, MD  polyethylene glycol (MIRALAX / GLYCOLAX) packet Take 17 g by mouth daily as needed for mild constipation.   Yes Historical Provider, MD  sertraline (ZOLOFT) 100 MG tablet Take 300 mg by mouth daily.    Yes Historical Provider, MD  simvastatin (ZOCOR) 20 MG tablet Take 1 tablet (20 mg total) by mouth daily at 6 PM. 05/18/12  Yes Calvert CantorSaima Rizwan, MD  valsartan-hydrochlorothiazide (DIOVAN-HCT) 160-12.5 MG per tablet Take 1 tablet by mouth daily.    Yes Historical Provider, MD  Vitamin D, Ergocalciferol, (DRISDOL) 50000 UNITS CAPS capsule 50,000 Units every 7 (seven) days.  06/26/13  Yes Historical Provider, MD  warfarin  (COUMADIN) 2.5 MG tablet Take 2.5 mg by mouth daily.   Yes Historical Provider, MD   Physical Exam: Filed Vitals:   04/22/14 1800  BP: 129/69  Pulse: 39  Temp:   Resp: 12    BP 129/69  Pulse 39  Temp(Src) 97.9 F (36.6 C) (Oral)  Resp 12  SpO2 97%  General:  Appears calm and comfortable, lethargic but wake up to answer questions.  Eyes: PERRL, normal lids, irises & conjunctiva ENT: grossly normal hearing, lips & tongue Neck: no LAD, masses or thyromegaly Cardiovascular: RRR, no m/r/g. No LE edema. Telemetry: SR, no arrhythmias  Respiratory: CTA bilaterally, no w/r/r. Normal respiratory effort. Abdomen: soft, ntnd Skin: no rash or induration seen on limited exam Musculoskeletal: grossly normal tone BUE/BLE Psychiatric: grossly normal mood and affect, speech fluent and appropriate Neurologic: Lethargic, wake up to answer questions, slow respond time. Right side paralysis, left side 5/5.           Labs on Admission:  Basic Metabolic Panel:  Recent Labs Lab 04/22/14 1405 04/22/14 1436  NA 141 145  K 3.5* 3.4*  CL 99 99  CO2 27  --   GLUCOSE 128* 127*  BUN 59* 52*  CREATININE 3.44* 3.70*  CALCIUM 10.2  --    Liver Function Tests:  Recent Labs Lab 04/22/14 1405  AST 18  ALT 15  ALKPHOS 70  BILITOT 0.4  PROT 7.6  ALBUMIN 3.8   No results found for this basename: LIPASE, AMYLASE,  in the last 168 hours No results found for this basename: AMMONIA,  in the last 168 hours CBC:  Recent Labs Lab 04/22/14 1405 04/22/14 1436  WBC 9.0  --   NEUTROABS 5.4  --   HGB 14.2 15.3  HCT 41.8 45.0  MCV 86.9  --   PLT 174  --    Cardiac Enzymes: No results found for this basename: CKTOTAL, CKMB, CKMBINDEX, TROPONINI,  in the last 168 hours  BNP (last 3 results)  Recent Labs  03/14/14 1130  PROBNP 5474.0*   CBG: No results found for this basename: GLUCAP,  in the last 168 hours  Radiological Exams on Admission: Ct Head Wo Contrast  04/22/2014   CLINICAL  DATA:  Altered mental status and facial droop  EXAM: CT HEAD WITHOUT CONTRAST  TECHNIQUE: Contiguous axial images were obtained from the base of the skull through the vertex without intravenous contrast. Study was obtained within 24 hr of patient's arrival at the emergency department.  COMPARISON:  March 14, 2014  FINDINGS: Mild generalized atrophy is stable. There is no demonstrable mass, hemorrhage, extra-axial fluid collection, or midline shift. There is small vessel disease throughout the centra semiovale bilaterally, stable. There is also small vessel disease in the anterior left pons, stable. There is  a subacute infarct in the posterior left lentiform nucleus region, showing evidence of evolution compared to the most recent prior study. No new/acute appearing infarct is seen on the current examination. Bony calvarium appears intact. The mastoid air cells are clear.  IMPRESSION: Evidence of further evolution of a subacute infarct in the posterior left lentiform nucleus. There is atrophy with extensive small vessel disease in the anterior left pons and supratentorial white matter bilaterally. No acute infarct apparent. No hemorrhage or mass effect. 1   Electronically Signed   By: Bretta Bang M.D.   On: 04/22/2014 14:41    EKG: Independently reviewed. Sinus bradycardia.   Assessment/Plan Active Problems:   HTN (hypertension)   DM (diabetes mellitus)   CVA (cerebral infarction)   Pulmonary emboli   AKI (acute kidney injury)   Bradycardia   Encephalopathy   1-Slurred speech, difficulty finding words, lethargic; admit to step down unit. Work up for stroke in process. MRI, MRA, ECHO and doppler. Neurology has already seen the patient.   2-Encephalopathy; patient more sleepy per family, worsening stroke like symptoms. This could be multifactorial, secondary to stroke, vs metabolic encephalopathy in setting of renal failure. Will do a work up for infection. Will send urine culture, will order  chest x ray. Check ABG.   3-Acute Renal Failure; multifactorial, secondary to decrease volume, poor oral intake, diuretics. Will discontinue metformin. Hold diuretics. Check urine culture, bladder scan. Will order renal US.   4-History of Massive PE; Patient underwent successful placement bilateral Pulmonary artery infusion caths on 4/28 and he received a 12 hour TPA infusion. He was then discharge on coumadin. Will order coumadin per pharmacy. Will have to hold coumadin dose tonight INR at 4.    5-Bradycardia; check TSH and hold bisoprolol. Patient HR last admission was in the 40 range.  6-Diabetes; hold oral hypoglycemic medications. SSI.  7-OSA; CPAP.   Code Status: Full Code.  Family Communication: Care discussed with wife and sister who were at bedside.  Disposition Plan: expect more than 2 nights inpatient.   Time spent: 75 minutes.   Alba Cory [facility] Pager [phone]  Disclaimer: This note may have been dictated with voice recognition software. Similar sounding words can inadvertently be transcribed and this note may contain transcription errors which may not have been corrected upon publication of note.

## 2014-04-22 NOTE — Consult Note (Signed)
Referring Physician: Dr Karma Ganja    Chief Complaint: Speech difficulties  HPI: Dakota Hall is a 63 y.o. male with a history of multiple strokes felt secondary to small vessel disease. The patient was recently at [facility] with a left basal ganglia stroke in March of 2015. He subsequently went to the [facility] rehabilitation facility in [place]. He recently returned to home to live with his wife approximately one week ago. She has noted intermittent dysarthria in the patient for about 2 weeks. He also has some word finding difficulties. Most of the history is obtained from his wife who is at the bedside. His sister is also in the room. Today his dysarthria seemed worse and it appeared to last longer. He also seemed to be mildly confused. His wife called EMS and had him brought to the emergency department. A CT of the head was negative for an acute infarct.  The patient was diagnosed with a pulmonary embolus in April. He has been on Coumadin. Earlier this week his INR was noted to be supratherapeutic and the Coumadin was held for several days and then resumed last night. Today his INR is 4.10.   During the patient's last admission his BUN and creatinine have been normal. Today his BUN is 52 with a creatinine of 3.7. His wife admits that he has not been eating or drinking well. He is on Diovan/hydrochlorothiazide as well as metformin. He states everything has a metallic taste. He also admits to being depressed and is seeing a psychiatrist.   In the emergency department today his heart rate is in the low 40s. The patient's wife stated that the occupational therapist told her earlier this week that his heart rate was also low. She has never known him to be bradycardic in the past. He has been falling asleep frequently and has been lethargic. He uses CPAP at home for obstructive sleep apnea. He reports not feeling well.  Following his last stroke in March he was changed from aspirin to Plavix. With the  diagnosis of the pulmonary embolus he was placed on Coumadin along with aspirin 81 mg daily.  The patient will be admitted for further evaluation; however, his neurologic symptoms at this time may be secondary to metabolic events.  Date last known well: Unable to determine Time last known well: Unable to determine tPA Given: No: Unable to determine when symptoms started in the patient and his outside the window for treatment.  Past Medical History  Diagnosis Date  . Diabetes mellitus   . Hypertension   . Stroke July 2013    left paramedian pontine, incidental right parietal subcortical. both d/t small vessel disease  . Stroke March 2015    left basal ganglia secondary to small vessel disease  . Obesity   . Depression with anxiety   . OSA on CPAP   . Pulmonary emboli 02/2014    Past Surgical History  Procedure Laterality Date  . Tonsillectomy    . Anal fissure repair      Family History  Problem Relation Age of Onset  . Cancer Mother   . Heart disease Father    Social History:  reports that he has never smoked. He has never used smokeless tobacco. He reports that he does not drink alcohol or use illicit drugs.  Allergies:  Allergies  Allergen Reactions  . Penicillins Hives    Medications: No current facility-administered medications for this encounter.   Current Outpatient Prescriptions  Medication Sig Dispense Refill  .  amLODipine (NORVASC) 10 MG tablet Take 10 mg by mouth daily.      . ARIPiprazole (ABILIFY) 5 MG tablet Take 2.5 mg by mouth daily.      Marland Kitchen aspirin EC 81 MG tablet Take 1 tablet (81 mg total) by mouth daily.      . baclofen (LIORESAL) 20 MG tablet Take 1 tablet (20 mg total) by mouth 3 (three) times daily.  30 each  0  . bisoprolol (ZEBETA) 5 MG tablet Take 1 tablet (5 mg total) by mouth daily.      Marland Kitchen docusate sodium (COLACE) 100 MG capsule Take 100 mg by mouth daily as needed for mild constipation.      . feeding supplement, ENSURE COMPLETE, (ENSURE  COMPLETE) LIQD Take 237 mLs by mouth 2 (two) times daily before lunch and supper.      . metFORMIN (GLUCOPHAGE) 500 MG tablet Take 1,000 mg by mouth daily.      . NONFORMULARY OR COMPOUNDED ITEM 1) PLEASE CHECK DAILY INR and adjust Coumadin for goal INR of 2-3 2) continue Lovenox every 12 hours thru 5/8 (minimum) AND also must have 2 consecutive days of therapeutic INR > 2 before stopping      . pantoprazole (PROTONIX) 40 MG tablet Take 1 tablet (40 mg total) by mouth at bedtime.      . pioglitazone (ACTOS) 15 MG tablet Take 15 mg by mouth daily.      . polyethylene glycol (MIRALAX / GLYCOLAX) packet Take 17 g by mouth daily as needed for mild constipation.      . sertraline (ZOLOFT) 100 MG tablet Take 300 mg by mouth daily.       . simvastatin (ZOCOR) 20 MG tablet Take 1 tablet (20 mg total) by mouth daily at 6 PM.  30 tablet  0  . valsartan-hydrochlorothiazide (DIOVAN-HCT) 160-12.5 MG per tablet Take 1 tablet by mouth daily.       . Vitamin D, Ergocalciferol, (DRISDOL) 50000 UNITS CAPS capsule 50,000 Units every 7 (seven) days.       Marland Kitchen warfarin (COUMADIN) 2.5 MG tablet Take 2.5 mg by mouth daily.        ROS: History obtained from the patient and wife  General ROS: negative for - chills, fatigue, fever, night sweats, weight gain or weight loss Psychological ROS: negative for - behavioral disorder, hallucinations, memory difficulties, mood swings or suicidal ideation Ophthalmic ROS: negative for - blurry vision, double vision, eye pain or loss of vision ENT ROS: negative for - epistaxis, nasal discharge, oral lesions, sore throat, tinnitus or vertigo Allergy and Immunology ROS: negative for - hives or itchy/watery eyes Hematological and Lymphatic ROS: negative for - bleeding problems, bruising or swollen lymph nodes Endocrine ROS: negative for - galactorrhea, hair pattern changes, polydipsia/polyuria or temperature intolerance Respiratory ROS: negative for - cough, hemoptysis, shortness of  breath or wheezing Cardiovascular ROS: negative for - chest pain, dyspnea on exertion, edema or irregular heartbeat Gastrointestinal ROS: negative for - abdominal pain, diarrhea, hematemesis, nausea/vomiting or stool incontinence Genito-Urinary ROS: negative for - dysuria, hematuria, incontinence or urinary frequency/urgency Musculoskeletal ROS: negative for - joint swelling or muscular weakness Neurological ROS: as noted in HPI Dermatological ROS: negative for rash and skin lesion changes   Physical Examination: Blood pressure 138/51, pulse 44, temperature 97.9 F (36.6 C), temperature source Oral, resp. rate 15, SpO2 95.00%.  Neurologic Examination:Mental Status: Lethargic  - flat affect  - oriented except to month and date  some difficulty performing calculations. Speech  dysarthric with mild aphasia.  Able to follow 3 step commands without difficulty. Cranial Nerves: II: Discs not visualized; Visual fields grossly normal, pupils equal, round, reactive to light and accommodation III,IV, VI: ptosis not present, extra-ocular motions intact bilaterally V,VII: smile symmetric, facial light touch sensation normal bilaterally VIII: hearing normal bilaterally IX,X: gag reflex present XI: Unable to lift her right shoulder XII: midline tongue extension Motor: Right : Upper extremity   0/5    Left:     Upper extremity   5/5  Lower extremity   2-3/5     Lower extremity   5/5 Tone and bulk:normal tone throughout; no atrophy noted Sensory: Pinprick and light touch intact throughout, bilaterally Deep Tendon Reflexes: Unable to elicit any reflexes Plantars: Right: Downgoing   Left: downgoing Cerebellar: normal finger-to-nose, normal rapid alternating movements and normal heel-to-shin test Gait: Patient is nonambulatory    Laboratory Studies:  Basic Metabolic Panel:  Recent Labs Lab 04/22/14 1405 04/22/14 1436  NA 141 145  K 3.5* 3.4*  CL 99 99  CO2 27  --   GLUCOSE 128* 127*  BUN  59* 52*  CREATININE 3.44* 3.70*  CALCIUM 10.2  --     Liver Function Tests:  Recent Labs Lab 04/22/14 1405  AST 18  ALT 15  ALKPHOS 70  BILITOT 0.4  PROT 7.6  ALBUMIN 3.8   No results found for this basename: LIPASE, AMYLASE,  in the last 168 hours No results found for this basename: AMMONIA,  in the last 168 hours  CBC:  Recent Labs Lab 04/22/14 1405 04/22/14 1436  WBC 9.0  --   NEUTROABS 5.4  --   HGB 14.2 15.3  HCT 41.8 45.0  MCV 86.9  --   PLT 174  --     Cardiac Enzymes: No results found for this basename: CKTOTAL, CKMB, CKMBINDEX, TROPONINI,  in the last 168 hours  BNP: No components found with this basename: POCBNP,   CBG: No results found for this basename: GLUCAP,  in the last 168 hours  Microbiology: Results for orders placed during the hospital encounter of 03/14/14  CULTURE, BLOOD (ROUTINE X 2)     Status: None   Collection Time    03/14/14  2:33 PM      Result Value Ref Range Status   Specimen Description BLOOD HAND LEFT   Final   Special Requests BOTTLES DRAWN AEROBIC AND ANAEROBIC 5CCBLUE 4CCRED   Final   Culture  Setup Time     Final   Value: 03/14/2014 16:39     Performed at [facility]   Culture     Final   Value: NO GROWTH 5 DAYS     Performed at [facility]   Report Status 03/20/2014 FINAL   Final  CULTURE, BLOOD (ROUTINE X 2)     Status: None   Collection Time    03/14/14  2:44 PM      Result Value Ref Range Status   Specimen Description BLOOD RIGHT HAND   Final   Special Requests BOTTLES DRAWN AEROBIC AND ANAEROBIC 5CC   Final   Culture  Setup Time     Final   Value: 03/14/2014 16:39     Performed at [facility]   Culture     Final   Value: NO GROWTH 5 DAYS     Performed at [facility]   Report Status 03/20/2014 FINAL   Final  MRSA PCR SCREENING  Status: None   Collection Time    03/14/14  5:36 PM      Result Value Ref Range Status   MRSA by PCR NEGATIVE  NEGATIVE Final   Comment:             The GeneXpert MRSA Assay (FDA     approved for NASAL specimens     only), is one component of a     comprehensive MRSA colonization     surveillance program. It is not     intended to diagnose MRSA     infection nor to guide or     monitor treatment for     MRSA infections.    Coagulation Studies:  Recent Labs  04/22/14 1405  LABPROT 38.2*  INR 4.10*    Urinalysis:  Recent Labs Lab 04/22/14 1340  COLORURINE AMBER*  LABSPEC 1.024  PHURINE 5.0  GLUCOSEU NEGATIVE  HGBUR NEGATIVE  BILIRUBINUR MODERATE*  KETONESUR 15*  PROTEINUR 30*  UROBILINOGEN 1.0  NITRITE NEGATIVE  LEUKOCYTESUR SMALL*    Lipid Panel:    Component Value Date/Time   CHOL 177 02/04/2014 0243   TRIG 94 02/04/2014 0243   HDL 45 02/04/2014 0243   CHOLHDL 3.9 02/04/2014 0243   VLDL 19 02/04/2014 0243   LDLCALC 113* 02/04/2014 0243    HgbA1C:  Lab Results  Component Value Date   HGBA1C 8.1* 02/06/2014    Urine Drug Screen:     Component Value Date/Time   LABOPIA NONE DETECTED 04/22/2014 1340   COCAINSCRNUR NONE DETECTED 04/22/2014 1340   LABBENZ NONE DETECTED 04/22/2014 1340   AMPHETMU NONE DETECTED 04/22/2014 1340   THCU NONE DETECTED 04/22/2014 1340   LABBARB NONE DETECTED 04/22/2014 1340    Alcohol Level:  Recent Labs Lab 04/22/14 1405  ETH <11    Other results: EKG: Sinus bradycardia rate 48 beats per minute. Please refer to formal reading for complete details.  Imaging:  Ct Head Wo Contrast 04/22/2014    Evidence of further evolution of a subacute infarct in the posterior left lentiform nucleus. There is atrophy with extensive small vessel disease in the anterior left pons and supratentorial white matter bilaterally. No acute infarct apparent. No hemorrhage or mass effect.    Delton See PA-C [facility] Pager [phone] 04/22/2014, 4:31 PM   Patient seen and examined.  Clinical course and management discussed.  Necessary edits performed.  I agree with the  above.  Assessment and plan of care developed and discussed below.     Assessment: 63 y.o. male with a history of multiple strokes felt secondary to small vessel disease now with intermittent dysarthria and mild word finding difficulties.  CT of the head was reviewed and shows no acute changes. The patient has multiple medical issues which may be contributing to his recent symptoms. With his multiple risk factors can not rule out a recurrent infarct as well.  Patient with recent stroke work up that included a echocardiogram and carotid doppler.  Doppler showed possible thrombus in transit in the right atrium (03/14/14).   Carotid doppler in March shows no hemodynamically significant stenosis.  Patient on Coumadin with elevated INR.  Further work up recommended.  Stroke Risk Factors - diabetes mellitus, hyperlipidemia, hypertension and OSA as well as previous strokes.  Plan: 1. HgbA1c, fasting lipid panel 2. MRI, MRA  of the brain without contrast 3. PT consult, OT consult, Speech consult 4. Prophylactic therapy-INR elevated.  Therefore will not restart Coumadin at this time 5.  Telemetry monitoring 6. Frequent neuro checks    Thana Farr, MD [facility] [phone]  04/22/2014  6:05 PM

## 2014-04-23 ENCOUNTER — Inpatient Hospital Stay (HOSPITAL_COMMUNITY): Payer: Managed Care, Other (non HMO)

## 2014-04-23 DIAGNOSIS — G9340 Encephalopathy, unspecified: Secondary | ICD-10-CM

## 2014-04-23 DIAGNOSIS — I2699 Other pulmonary embolism without acute cor pulmonale: Secondary | ICD-10-CM

## 2014-04-23 DIAGNOSIS — E440 Moderate protein-calorie malnutrition: Secondary | ICD-10-CM

## 2014-04-23 DIAGNOSIS — F341 Dysthymic disorder: Secondary | ICD-10-CM

## 2014-04-23 DIAGNOSIS — I10 Essential (primary) hypertension: Secondary | ICD-10-CM

## 2014-04-23 DIAGNOSIS — E119 Type 2 diabetes mellitus without complications: Secondary | ICD-10-CM

## 2014-04-23 LAB — GLUCOSE, CAPILLARY
GLUCOSE-CAPILLARY: 117 mg/dL — AB (ref 70–99)
Glucose-Capillary: 109 mg/dL — ABNORMAL HIGH (ref 70–99)
Glucose-Capillary: 110 mg/dL — ABNORMAL HIGH (ref 70–99)
Glucose-Capillary: 115 mg/dL — ABNORMAL HIGH (ref 70–99)
Glucose-Capillary: 220 mg/dL — ABNORMAL HIGH (ref 70–99)
Glucose-Capillary: 91 mg/dL (ref 70–99)

## 2014-04-23 LAB — LIPID PANEL
CHOL/HDL RATIO: 5.1 ratio
Cholesterol: 180 mg/dL (ref 0–200)
HDL: 35 mg/dL — ABNORMAL LOW (ref >39)
LDL CALC: 100 mg/dL — AB (ref 0–99)
Triglycerides: 224 mg/dL — ABNORMAL HIGH (ref <150)
VLDL: 45 mg/dL — AB (ref 0–40)

## 2014-04-23 LAB — PROTIME-INR
INR: 4.72 — ABNORMAL HIGH (ref 0.00–1.49)
Prothrombin Time: 42.5 seconds — ABNORMAL HIGH (ref 11.6–15.2)

## 2014-04-23 LAB — MRSA PCR SCREENING: MRSA BY PCR: NEGATIVE

## 2014-04-23 LAB — HEMOGLOBIN A1C
Hgb A1c MFr Bld: 6.9 % — ABNORMAL HIGH (ref <5.7)
Mean Plasma Glucose: 151 mg/dL — ABNORMAL HIGH (ref <117)

## 2014-04-23 MED ORDER — ENSURE COMPLETE PO LIQD
237.0000 mL | Freq: Two times a day (BID) | ORAL | Status: DC
  Administered 2014-04-23 – 2014-04-26 (×7): 237 mL via ORAL

## 2014-04-23 MED ORDER — ARIPIPRAZOLE 5 MG PO TABS
2.5000 mg | ORAL_TABLET | Freq: Every day | ORAL | Status: DC
  Administered 2014-04-23 – 2014-04-27 (×5): 2.5 mg via ORAL
  Filled 2014-04-23 (×6): qty 1

## 2014-04-23 MED ORDER — ISOSORB DINITRATE-HYDRALAZINE 20-37.5 MG PO TABS
1.0000 | ORAL_TABLET | Freq: Two times a day (BID) | ORAL | Status: DC
  Administered 2014-04-23 – 2014-04-27 (×8): 1 via ORAL
  Filled 2014-04-23 (×10): qty 1

## 2014-04-23 MED ORDER — AMLODIPINE BESYLATE 10 MG PO TABS
10.0000 mg | ORAL_TABLET | Freq: Every day | ORAL | Status: DC
  Administered 2014-04-23 – 2014-04-27 (×5): 10 mg via ORAL
  Filled 2014-04-23 (×5): qty 1

## 2014-04-23 NOTE — Evaluation (Signed)
Clinical/Bedside Swallow Evaluation Patient Details  Name: MAVERIK BUETI MRN: [id number] Date of Birth: 1950/12/20  Today's Date: 04/23/2014 Time: 1525-1550 SLP Time Calculation (min): 25 min  Past Medical History:  Past Medical History  Diagnosis Date  . Diabetes mellitus   . Hypertension   . Stroke July 2013    left paramedian pontine, incidental right parietal subcortical. both d/t small vessel disease  . Stroke March 2015    left basal ganglia secondary to small vessel disease  . Obesity   . Depression with anxiety   . OSA on CPAP   . Pulmonary emboli 02/2014   Past Surgical History:  Past Surgical History  Procedure Laterality Date  . Tonsillectomy    . Anal fissure repair     HPI:  63 year old male admitted 04/22/14 due to increased slurred speech, decreased word finding.  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 Pt was DC'd from CIR 03/03/14.  MRI revealed no new infarct, but evolution of left basal ganglia and corona radiata infarct.   Assessment / Plan / Recommendation Clinical Impression  Pt completed oral care after set up.  Oral motor strength and function are mildly impaired, but at baseline s/p 01/2014 CVA. Pt exhibited no overt s/s aspiration with any consistency tested. Will begin dys 3/chopped meat diet and thin liquids. ST to follow briefly for diet tolerance.    Aspiration Risk  Mild    Diet Recommendation Dysphagia 3 (Mechanical Soft);Thin liquid (chop meats)   Liquid Administration via: Straw;Cup Medication Administration: Whole meds with liquid Supervision: Patient able to self feed Compensations: Small sips/bites;Slow rate Postural Changes and/or Swallow Maneuvers: Seated upright 90 degrees    Other  Recommendations Oral Care Recommendations: Oral care Q4 per protocol Other Recommendations: Clarify dietary restrictions   Follow Up Recommendations  None    Frequency and Duration min 1 x/week  1 week   Pertinent  Vitals/Pain VSS, no pain reported    SLP Swallow Goals  po tolerance   Swallow Study Prior Functional Status   Soft foods, chopped meats at baseline, due to poor dentition and right hemiparesis.    General Date of Onset: 04/22/14 HPI: 63 year old male admitted 04/22/14 due to increased slurred speech, decreased word finding.  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 Pt was DC'd from CIR 03/03/14.  MRI revealed no new infarct, but evolution of left basal ganglia and corona radiata infarct. Type of Study: Bedside swallow evaluation Previous Swallow Assessment: 02/04/14 BSE - Mech soft/ thin liquids recommended. Diet Prior to this Study: NPO Temperature Spikes Noted: No Respiratory Status: Room air History of Recent Intubation: No Behavior/Cognition: Alert;Cooperative;Pleasant mood Oral Cavity - Dentition: Missing dentition Self-Feeding Abilities: Needs assist Patient Positioning: Upright in bed Baseline Vocal Quality: Clear Volitional Cough: Weak Volitional Swallow: Able to elicit    Oral/Motor/Sensory Function Overall Oral Motor/Sensory Function: Impaired at baseline   Ice Chips Ice chips: Within functional limits Presentation: Spoon   Thin Liquid Thin Liquid: Within functional limits Presentation: Straw    Nectar Thick Nectar Thick Liquid: Not tested   Honey Thick Honey Thick Liquid: Not tested   Puree Puree: Within functional limits Presentation: Spoon   Solid   GO   Iley Breeden B. Manual Navarra, MSP, CCC-SLP [phone]  Solid: Within functional limits       Gray Bernhardt 04/23/2014,4:02 PM

## 2014-04-23 NOTE — Progress Notes (Addendum)
Subjective: Patient much improved today.  More alert.  Up in chair.    Objective: Current vital signs: BP 179/73  Pulse 47  Temp(Src) 97.9 F (36.6 C) (Oral)  Resp 16  Ht 5\' 8"  (1.727 m)  Wt 88.9 kg (195 lb 15.8 oz)  BMI 29.81 kg/m2  SpO2 99% Vital signs in last 24 hours: Temp:  [97.8 F (36.6 C)-98.2 F (36.8 C)] 97.9 F (36.6 C) (06/07 1239) Pulse Rate:  [39-49] 47 (06/07 1239) Resp:  [11-19] 16 (06/07 1239) BP: (109-179)/(50-85) 179/73 mmHg (06/07 1239) SpO2:  [89 %-99 %] 99 % (06/07 1239) Weight:  [88.9 kg (195 lb 15.8 oz)] 88.9 kg (195 lb 15.8 oz) (06/06 2042)  Intake/Output from previous day: 06/06 0701 - 06/07 0700 In: -  Out: 200 [Urine:200] Intake/Output this shift: Total I/O In: -  Out: 400 [Urine:400] Nutritional status: NPO  Neurologic Exam: Mental Status:  Alert. Mild aphasia noted.  Able to follow 3 step commands without difficulty.  Cranial Nerves:  II: Discs not visualized; Visual fields grossly normal, pupils equal, round, reactive to light and accommodation  III,IV, VI: ptosis not present, extra-ocular motions intact bilaterally  V,VII: smile symmetric, facial light touch sensation normal bilaterally  VIII: hearing normal bilaterally  IX,X: gag reflex present  XI: Unable to lift her right shoulder  XII: midline tongue extension  Motor:  Right : Upper extremity 0/5       Left: Upper extremity 5/5   Lower extremity 2-3/5    Lower extremity 5/5  Tone and bulk:normal tone throughout; no atrophy noted  Sensory: Pinprick and light touch intact throughout, bilaterally  Deep Tendon Reflexes: Unable to elicit any reflexes  Plantars:  Right: Downgoing    Left: downgoing  Cerebellar:  normal finger-to-nose on the left   Lab Results: Basic Metabolic Panel:  Recent Labs Lab 04/22/14 1405 04/22/14 1436 04/22/14 1921  NA 141 145  --   K 3.5* 3.4*  --   CL 99 99  --   CO2 27  --   --   GLUCOSE 128* 127*  --   BUN 59* 52*  --   CREATININE 3.44*  3.70*  --   CALCIUM 10.2  --   --   MG  --   --  2.1    Liver Function Tests:  Recent Labs Lab 04/22/14 1405  AST 18  ALT 15  ALKPHOS 70  BILITOT 0.4  PROT 7.6  ALBUMIN 3.8   No results found for this basename: LIPASE, AMYLASE,  in the last 168 hours No results found for this basename: AMMONIA,  in the last 168 hours  CBC:  Recent Labs Lab 04/22/14 1405 04/22/14 1436  WBC 9.0  --   NEUTROABS 5.4  --   HGB 14.2 15.3  HCT 41.8 45.0  MCV 86.9  --   PLT 174  --     Cardiac Enzymes: No results found for this basename: CKTOTAL, CKMB, CKMBINDEX, TROPONINI,  in the last 168 hours  Lipid Panel:  Recent Labs Lab 04/23/14 0250  CHOL 180  TRIG 224*  HDL 35*  CHOLHDL 5.1  VLDL 45*  LDLCALC 100*    CBG:  Recent Labs Lab 04/22/14 2026 04/22/14 2358 04/23/14 0327 04/23/14 1036 04/23/14 1238  GLUCAP 105* 109* 115* 117* 110*    Microbiology: Results for orders placed during the hospital encounter of 04/22/14  MRSA PCR SCREENING     Status: None   Collection Time    04/23/14  6:13 AM      Result Value Ref Range Status   MRSA by PCR NEGATIVE  NEGATIVE Final   Comment:            The GeneXpert MRSA Assay (FDA     approved for NASAL specimens     only), is one component of a     comprehensive MRSA colonization     surveillance program. It is not     intended to diagnose MRSA     infection nor to guide or     monitor treatment for     MRSA infections.    Coagulation Studies:  Recent Labs  04/22/14 1405 04/23/14 0250  LABPROT 38.2* 42.5*  INR 4.10* 4.72*    Imaging: Dg Chest 2 View  04/22/2014   CLINICAL DATA:  Encephalopathy  EXAM: CHEST  2 VIEW  COMPARISON:  Chest radiograph March 14, 2014 and chest CT Mar 17, 2014  FINDINGS: There is no edema or consolidation. Heart is upper normal in size with normal pulmonary vascularity. No adenopathy. There is collapsed on vertebral body, a stable finding.  IMPRESSION: No edema or consolidation. Stable  collapse of the L1 vertebral body.   Electronically Signed   By: Bretta BangWilliam  Woodruff M.D.   On: 04/22/2014 20:05   Ct Head Wo Contrast  04/22/2014   CLINICAL DATA:  Altered mental status and facial droop  EXAM: CT HEAD WITHOUT CONTRAST  TECHNIQUE: Contiguous axial images were obtained from the base of the skull through the vertex without intravenous contrast. Study was obtained within 24 hr of patient's arrival at the emergency department.  COMPARISON:  March 14, 2014  FINDINGS: Mild generalized atrophy is stable. There is no demonstrable mass, hemorrhage, extra-axial fluid collection, or midline shift. There is small vessel disease throughout the centra semiovale bilaterally, stable. There is also small vessel disease in the anterior left pons, stable. There is a subacute infarct in the posterior left lentiform nucleus region, showing evidence of evolution compared to the most recent prior study. No new/acute appearing infarct is seen on the current examination. Bony calvarium appears intact. The mastoid air cells are clear.  IMPRESSION: Evidence of further evolution of a subacute infarct in the posterior left lentiform nucleus. There is atrophy with extensive small vessel disease in the anterior left pons and supratentorial white matter bilaterally. No acute infarct apparent. No hemorrhage or mass effect. 1   Electronically Signed   By: Bretta BangWilliam  Woodruff M.D.   On: 04/22/2014 14:41   Mr Brain Wo Contrast  04/23/2014   CLINICAL DATA:  Stroke. Episode of slurred speech. Recent infarct. The symptoms have since resolved.  EXAM: MRI HEAD WITHOUT CONTRAST  MRA HEAD WITHOUT CONTRAST  TECHNIQUE: Multiplanar, multiecho pulse sequences of the brain and surrounding structures were obtained without intravenous contrast. Angiographic images of the head were obtained using MRA technique without contrast.  COMPARISON:  CT head 04/22/2014.  MRI brain 02/04/2014.  FINDINGS: MRI HEAD FINDINGS  The previous left basal ganglia and  corona radiata radiata is again noted. There is some susceptibility along the infarct tract compatible with microhemorrhage, new since the prior exam.  No new areas of infarct are present. Extensive periventricular and subcortical white matter change is present bilaterally. Mild generalized atrophy is noted. White matter changes into the brainstem are stable. No acute hemorrhage or mass lesion is evident.  Flow is present in the major intracranial arteries. The globes and orbits are intact. The paranasal sinuses and mastoid air cells are  clear.  MRA HEAD FINDINGS  The internal carotid arteries are within normal limits from the high cervical segments through the ICA termini bilaterally. The A1 and M1 segments are normal. The anterior communicating artery is patent. The MCA bifurcations are intact. Medium and small vessel irregularity in the ACA and MCA branch vessels is similar to the prior exam.  The left vertebral artery is normal scratch the the left vertebral artery is dominant. There is mild narrowing of the non dominant right vertebral artery at the dural margin without change. The basilar artery is intact. Both posterior cerebral arteries originate from the basilar tip. Narrowing of the proximal posterior cerebral artery is more prominent on the left, without change.  IMPRESSION: 1. Evolution of the left basal ganglia and corona radiata infarct with interval development of petechial hemorrhage. There is no mass effect or surrounding edema. 2. No new infarct or acute hemorrhage. 3. Stable atrophy and diffuse white matter disease. This likely reflects the sequela of chronic microvascular ischemia. 4. Stable atherosclerotic changes without evidence for significant proximal stenosis, aneurysm, or branch vessel occlusion within the circle of Willis.   Electronically Signed   By: Gennette Pac M.D.   On: 04/23/2014 13:12   US Renal  04/23/2014   CLINICAL DATA:  Renal failure.  EXAM: RENAL/URINARY TRACT ULTRASOUND  COMPLETE  COMPARISON:  12/09/2006  FINDINGS: Right Kidney:  Length: 11.7 cm. Echogenicity within normal limits. No mass or hydronephrosis visualized.  Left Kidney:  Length: 11.5 cm. Echogenicity within normal limits. No mass or hydronephrosis visualized.  Bladder:  Appears normal for degree of bladder distention.  IMPRESSION: Normal renal ultrasound.   Electronically Signed   By: Elberta Fortis M.D.   On: 04/23/2014 11:44   Mr Maxine Glenn Head/brain Wo Cm  04/23/2014   CLINICAL DATA:  Stroke. Episode of slurred speech. Recent infarct. The symptoms have since resolved.  EXAM: MRI HEAD WITHOUT CONTRAST  MRA HEAD WITHOUT CONTRAST  TECHNIQUE: Multiplanar, multiecho pulse sequences of the brain and surrounding structures were obtained without intravenous contrast. Angiographic images of the head were obtained using MRA technique without contrast.  COMPARISON:  CT head 04/22/2014.  MRI brain 02/04/2014.  FINDINGS: MRI HEAD FINDINGS  The previous left basal ganglia and corona radiata radiata is again noted. There is some susceptibility along the infarct tract compatible with microhemorrhage, new since the prior exam.  No new areas of infarct are present. Extensive periventricular and subcortical white matter change is present bilaterally. Mild generalized atrophy is noted. White matter changes into the brainstem are stable. No acute hemorrhage or mass lesion is evident.  Flow is present in the major intracranial arteries. The globes and orbits are intact. The paranasal sinuses and mastoid air cells are clear.  MRA HEAD FINDINGS  The internal carotid arteries are within normal limits from the high cervical segments through the ICA termini bilaterally. The A1 and M1 segments are normal. The anterior communicating artery is patent. The MCA bifurcations are intact. Medium and small vessel irregularity in the ACA and MCA branch vessels is similar to the prior exam.  The left vertebral artery is normal scratch the the left vertebral  artery is dominant. There is mild narrowing of the non dominant right vertebral artery at the dural margin without change. The basilar artery is intact. Both posterior cerebral arteries originate from the basilar tip. Narrowing of the proximal posterior cerebral artery is more prominent on the left, without change.  IMPRESSION: 1. Evolution of the left basal ganglia and  corona radiata infarct with interval development of petechial hemorrhage. There is no mass effect or surrounding edema. 2. No new infarct or acute hemorrhage. 3. Stable atrophy and diffuse white matter disease. This likely reflects the sequela of chronic microvascular ischemia. 4. Stable atherosclerotic changes without evidence for significant proximal stenosis, aneurysm, or branch vessel occlusion within the circle of Willis.   Electronically Signed   By: Gennette Pac M.D.   On: 04/23/2014 13:12    Medications:  I have reviewed the patient's current medications. Scheduled: . aspirin EC  81 mg Oral Daily  . insulin aspart  0-9 Units Subcutaneous TID WC  . pantoprazole  40 mg Oral QHS  . sertraline  250 mg Oral QHS  . simvastatin  20 mg Oral q1800  . sodium chloride  1,000 mL Intravenous Once  . Warfarin - Pharmacist Dosing Inpatient   Does not apply q1800    Assessment/Plan: Patient improved today.  MRI of the brain performed and shows no acute changes.  Some microhemorrhage is noted.  INR remains elevated.    Recommendations: 1.  Would not proceed with stroke work up at this time.   2.  Agree with holding Coumadin 3.  Agree with continued therapy 4.  Agree with continuing to address medical issues.   5.  No further neurologic intervention is recommended at this time.  If further questions arise, please call or page at that time.  Thank you for allowing neurology to participate in the care of this patient.    LOS: 1 day   Thana Farr, MD [facility] [phone] 04/23/2014  1:39 PM

## 2014-04-23 NOTE — Progress Notes (Signed)
VASCULAR LAB    Patient had normal Carotid Dopplers done 02/04/14 as part of stroke workup.  Please advise if you would like study repeated, as we generally do not repeat normal studies within 6 months.     Kern [place], RVT 04/23/2014, 7:32 AM

## 2014-04-23 NOTE — Evaluation (Signed)
Physical Therapy Evaluation Patient Details Name: Dakota Hall MRN: [id number] DOB: 05/11/1951 Today's Date: 04/23/2014   History of Present Illness  HPI: Dakota Hall is a 63 y.o. male with PMH significant for Stroke , Diabetes, history of massive PE S/P infusion Catheter TPA, on coumadin, OSA on CPAP who was notice to have worsening slurred speech this morning day of admission. Patient wife relates that she has notice slurred speech intermittent over last 2 weeks, worse this morning. Had L MCA CVA earlier this year and had a CIR stay, then dc'd to SNF; Current admission is from SNF where he had been getting ongoing rehab  Clinical Impression   Pt admitted with above. Pt currently with functional limitations due to the deficits listed below (see PT Problem List).  Pt will benefit from skilled PT to increase their independence and safety with mobility to allow discharge to the venue listed below.       Follow Up Recommendations SNF;Supervision/Assistance - 24 hour    Equipment Recommendations  None recommended by PT    Recommendations for Other Services OT consult;Speech consult     Precautions / Restrictions Precautions Precautions: Fall      Mobility  Bed Mobility Overal bed mobility: Needs Assistance Bed Mobility: Supine to Sit     Supine to sit: Mod assist     General bed mobility comments: cues for technique and physical assist to clear LEs over EOB; Noted good use of LLE to half-bridge to EOB; Pulled up to sit with LUE  Transfers Overall transfer level: Needs assistance Equipment used: 2 person hand held assist Transfers: Sit to/from [employer] Sit to Stand: +2 physical assistance;Max assist Stand pivot transfers: +2 physical assistance;Mod assist       General transfer comment: Pt demonstrated enough power/strength in LLE to power-up to stand; Required +2 assist for balance as he had significant posterior lean such that without physical assist he  would have fallen back to bed; Able to take small pivot steps bed to chair placed on his L with bil UE support (R support given at shoulder girdle) and at gait belt  Ambulation/Gait             General Gait Details: did not cover an appreciable distance today  Stairs            Wheelchair Mobility    Modified Rankin (Stroke Patients Only) Modified Rankin (Stroke Patients Only) Pre-Morbid Rankin Score: Moderately severe disability Modified Rankin: Severe disability     Balance Overall balance assessment: Needs assistance Sitting-balance support: Single extremity supported;Feet supported Sitting balance-Leahy Scale: Fair     Standing balance support: Bilateral upper extremity supported;During functional activity Standing balance-Leahy Scale: Zero Standing balance comment: Significant posterior lean in static standing; Worked on balance in standing with cues to lean forward and feel the floor with forefeet                             Pertinent Vitals/Pain VSS; O2 sat 99-100% on room air Low HR, but stable; RN aware  Pain with R hand movement; patient repositioned for comfort     Home Living Family/patient expects to be discharged to:: Skilled nursing facility Living Arrangements: Spouse/significant other                    Prior Function Level of Independence: Needs assistance   Gait / Transfers Assistance Needed: Since CVA  pt amb  short distance (40') with mod A and using rt AFO and rt hand splint to hold rt hand on walker.     Comments: Prior to stroke pt was totally indpendent.     Hand Dominance   Dominant Hand: Right    Extremity/Trunk Assessment   Upper Extremity Assessment: Defer to OT evaluation Decr function, range and strength RUE with dense hemiparesis Noted extreme pain with attempts to get proper fit of RUE splint, in particular, with 3rd through 5th ray passive extension            Lower Extremity Assessment: RLE  deficits/detail RLE Deficits / Details: Gross motor function decr; Noted incr tone with passive ROM with hip/knee flexion and extension; Difficulty wit voluntary moveemnt       Communication   Communication: Expressive difficulties  Cognition Arousal/Alertness: Awake/alert Behavior During Therapy: WFL for tasks assessed/performed Overall Cognitive Status: Within Functional Limits for tasks assessed                      General Comments      Exercises        Assessment/Plan    PT Assessment Patient needs continued PT services  PT Diagnosis Difficulty walking;Hemiplegia dominant side   PT Problem List Decreased strength;Decreased range of motion;Decreased activity tolerance;Decreased balance;Decreased mobility;Decreased coordination;Decreased knowledge of use of DME;Pain;Impaired tone  PT Treatment Interventions DME instruction;Gait training;Functional mobility training;Therapeutic activities;Therapeutic exercise;Balance training;Neuromuscular re-education;Patient/family education;Wheelchair mobility training   PT Goals (Current goals can be found in the Care Plan section) Acute Rehab PT Goals Patient Stated Goal: Agreeable t OOB PT Goal Formulation: With patient Time For Goal Achievement: 05/07/14 Potential to Achieve Goals: Good    Frequency Min 3X/week   Barriers to discharge        Co-evaluation               End of Session Equipment Utilized During Treatment: Gait belt Activity Tolerance: Patient tolerated treatment well;Patient limited by pain (painful R hand) Patient left: in chair;with call bell/phone within reach Nurse Communication: Mobility status;Need for lift equipment         Time: 0830-0900 PT Time Calculation (min): 30 min   Charges:   PT Evaluation $Initial PT Evaluation Tier I: 1 Procedure PT Treatments $Therapeutic Activity: 23-37 mins   PT G Codes:          [facility] Ham[facility] RedgraniteGarrigan 04/23/2014, 12:39 PM  Van ClinesHolly Treon Kehl, PT   Acute Rehabilitation Services Pager [phone] Office [phone]

## 2014-04-23 NOTE — Progress Notes (Signed)
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 (dose per pharmacy)  2-Encephalopathy -as mentioned above. -no signs of infection -continue supportive care   3-Acute Renal Failure; multifactorial, secondary to decrease poor oral intake, continue use of diuretics and other nephrotoxic drugs. -will continue holding diuretics, diovan and metformin -continue IVF's -follow Cr trend -no infection on UA; renal US w/o hydronephrosis  4-History of Massive PE; Patient underwent successful placement bilateral Pulmonary artery infusion caths on 4/28 and he received a 12 hour TPA infusion. He was then discharge on coumadin.  -Will cont coumadin per pharmacy.    5-Bradycardia -TSH WNL -continue holding bisoprolol  6-Diabetes; continue SSI; A1C 6.9  7-OSA; CPAP.  8-HLD: will continue statins    Code Status: Full Family Communication: no family at bedside Disposition Plan: to be determine   Consultants:  Neurology   Procedures:  See below for x-ray reports  2-D echo: pending  Carotid dopplers: pending  Antibiotics:  None   HPI/Subjective: Afebrile, feeling better. Patient denies CP or SOB.   Objective: Filed Vitals:   04/23/14 0329  BP: 118/67  Pulse: 42  Temp: 97.8 F (36.6 C)  Resp: 19    Intake/Output Summary (Last 24 hours) at 04/23/14 0831 Last data filed at 04/22/14 2230  Gross per 24 hour  Intake      0 ml  Output    200  ml  Net   -200 ml   Filed Weights   04/22/14 2042  Weight: 88.9 kg (195 lb 15.8 oz)    Exam:   General:  AAOX3, afebrile and feeling better; as per records dysarthria and right hemiparesis (appears at baseline)  Cardiovascular: S1 and S2, no rubs or gallops  Respiratory: CTA, no wheezing or crackles  Abdomen: soft, NT, ND, positive BS  Musculoskeletal: right hemiparesis; no joint swelling  Data Reviewed: Basic Metabolic Panel:  Recent Labs Lab 04/22/14 1405 04/22/14 1436 04/22/14 1921  NA 141 145  --   K 3.5* 3.4*  --   CL 99 99  --   CO2 27  --   --   GLUCOSE 128* 127*  --   BUN 59* 52*  --   CREATININE 3.44* 3.70*  --   CALCIUM 10.2  --   --   MG  --   --  2.1   Liver Function Tests:  Recent Labs Lab 04/22/14 1405  AST 18  ALT 15  ALKPHOS 70  BILITOT 0.4  PROT 7.6  ALBUMIN 3.8   CBC:  Recent Labs Lab 04/22/14 1405 04/22/14 1436  WBC 9.0  --   NEUTROABS 5.4  --   HGB 14.2 15.3  HCT 41.8 45.0  MCV 86.9  --   PLT 174  --    BNP (last 3 results)  Recent Labs  03/14/14 1130  PROBNP 5474.0*   CBG:  Recent Labs Lab 04/22/14 2026 04/22/14 2358 04/23/14 0327  GLUCAP 105* 109* 115*    Recent Results (from the past 240 hour(s))  MRSA PCR SCREENING     Status: None   Collection Time  04/23/14  6:13 AM      Result Value Ref Range Status   MRSA by PCR NEGATIVE  NEGATIVE Final   Comment:            The GeneXpert MRSA Assay (FDA     approved for NASAL specimens     only), is one component of a     comprehensive MRSA colonization     surveillance program. It is not     intended to diagnose MRSA     infection nor to guide or     monitor treatment for     MRSA infections.     Studies: Dg Chest 2 View  04/22/2014   CLINICAL DATA:  Encephalopathy  EXAM: CHEST  2 VIEW  COMPARISON:  Chest radiograph March 14, 2014 and chest CT Mar 17, 2014  FINDINGS: There is no edema or consolidation. Heart is upper normal in size with normal pulmonary  vascularity. No adenopathy. There is collapsed on vertebral body, a stable finding.  IMPRESSION: No edema or consolidation. Stable collapse of the L1 vertebral body.   Electronically Signed   By: Bretta Bang M.D.   On: 04/22/2014 20:05   Ct Head Wo Contrast  04/22/2014   CLINICAL DATA:  Altered mental status and facial droop  EXAM: CT HEAD WITHOUT CONTRAST  TECHNIQUE: Contiguous axial images were obtained from the base of the skull through the vertex without intravenous contrast. Study was obtained within 24 hr of patient's arrival at the emergency department.  COMPARISON:  March 14, 2014  FINDINGS: Mild generalized atrophy is stable. There is no demonstrable mass, hemorrhage, extra-axial fluid collection, or midline shift. There is small vessel disease throughout the centra semiovale bilaterally, stable. There is also small vessel disease in the anterior left pons, stable. There is a subacute infarct in the posterior left lentiform nucleus region, showing evidence of evolution compared to the most recent prior study. No new/acute appearing infarct is seen on the current examination. Bony calvarium appears intact. The mastoid air cells are clear.  IMPRESSION: Evidence of further evolution of a subacute infarct in the posterior left lentiform nucleus. There is atrophy with extensive small vessel disease in the anterior left pons and supratentorial white matter bilaterally. No acute infarct apparent. No hemorrhage or mass effect. 1   Electronically Signed   By: Bretta Bang M.D.   On: 04/22/2014 14:41    Scheduled Meds: . aspirin EC  81 mg Oral Daily  . insulin aspart  0-9 Units Subcutaneous TID WC  . pantoprazole  40 mg Oral QHS  . sertraline  250 mg Oral QHS  . simvastatin  20 mg Oral q1800  . sodium chloride  1,000 mL Intravenous Once  . Warfarin - Pharmacist Dosing Inpatient   Does not apply q1800   Continuous Infusions: . sodium chloride 100 mL/hr at 04/23/14 0600    Principal  Problem:   Encephalopathy Active Problems:   HTN (hypertension)   DM (diabetes mellitus)   CVA (cerebral infarction)   Pulmonary emboli   AKI (acute kidney injury)   Bradycardia    Time spent: >30 minutes    Vassie Loll  [facility] Pager [phone]. If 7PM-7AM, please contact night-coverage at [email or web address], password [facility] 04/23/2014, 8:31 AM  LOS: 1 day

## 2014-04-23 NOTE — Progress Notes (Signed)
ANTICOAGULATION CONSULT NOTE - Initial Consult  Pharmacy Consult for warfarin Indication: VTE treatment  Allergies  Allergen Reactions  . Penicillins Hives    Patient Measurements: Height: 5\' 8"  (172.7 cm) Weight: 195 lb 15.8 oz (88.9 kg) IBW/kg (Calculated) : 68.4   Vital Signs: Temp: 97.8 F (36.6 C) (06/07 0329) Temp src: Oral (06/07 0329) BP: 118/67 mmHg (06/07 0329) Pulse Rate: 42 (06/07 0329)  Labs:  Recent Labs  04/22/14 1405 04/22/14 1436 04/23/14 0250  HGB 14.2 15.3  --   HCT 41.8 45.0  --   PLT 174  --   --   APTT 55*  --   --   LABPROT 38.2*  --  42.5*  INR 4.10*  --  4.72*  CREATININE 3.44* 3.70*  --     Estimated Creatinine Clearance: 22.4 ml/min (by C-G formula based on Cr of 3.7).   Medical History: Past Medical History  Diagnosis Date  . Diabetes mellitus   . Hypertension   . Stroke July 2013    left paramedian pontine, incidental right parietal subcortical. both d/t small vessel disease  . Stroke March 2015    left basal ganglia secondary to small vessel disease  . Obesity   . Depression with anxiety   . OSA on CPAP   . Pulmonary emboli 02/2014    Assessment: 1 YOM with history of multiple strokes d/t small vessel disease. He was diagnosed with PE in April of this year and was started on warfarin. Earlier this week, INR was elevated and he was instructed to hold his doses. PTA dose is 2.5mg  daily per history. INR is still elevated today.   Goal of Therapy:  INR 2-3 Monitor platelets by anticoagulation protocol: Yes   Plan:   Cont to hold coumadin Daily INR

## 2014-04-24 DIAGNOSIS — I519 Heart disease, unspecified: Secondary | ICD-10-CM

## 2014-04-24 LAB — GLUCOSE, CAPILLARY
Glucose-Capillary: 109 mg/dL — ABNORMAL HIGH (ref 70–99)
Glucose-Capillary: 128 mg/dL — ABNORMAL HIGH (ref 70–99)
Glucose-Capillary: 135 mg/dL — ABNORMAL HIGH (ref 70–99)
Glucose-Capillary: 116 mg/dL — ABNORMAL HIGH (ref 70–99)

## 2014-04-24 LAB — BASIC METABOLIC PANEL
BUN: 47 mg/dL — AB (ref 6–23)
CALCIUM: 9.4 mg/dL (ref 8.4–10.5)
CO2: 24 mEq/L (ref 19–32)
Chloride: 104 mEq/L (ref 96–112)
Creatinine, Ser: 1.87 mg/dL — ABNORMAL HIGH (ref 0.50–1.35)
GFR, EST AFRICAN AMERICAN: 43 mL/min — AB (ref >90)
GFR, EST NON AFRICAN AMERICAN: 37 mL/min — AB (ref >90)
Glucose, Bld: 110 mg/dL — ABNORMAL HIGH (ref 70–99)
Potassium: 3.5 mEq/L — ABNORMAL LOW (ref 3.7–5.3)
Sodium: 144 mEq/L (ref 137–147)

## 2014-04-24 LAB — PROTIME-INR
INR: 3.65 — AB (ref 0.00–1.49)
PROTHROMBIN TIME: 34.9 s — AB (ref 11.6–15.2)

## 2014-04-24 LAB — URINE CULTURE

## 2014-04-24 LAB — CBC
HEMATOCRIT: 36.5 % — AB (ref 39.0–52.0)
Hemoglobin: 12.3 g/dL — ABNORMAL LOW (ref 13.0–17.0)
MCH: 29.5 pg (ref 26.0–34.0)
MCHC: 33.7 g/dL (ref 30.0–36.0)
MCV: 87.5 fL (ref 78.0–100.0)
PLATELETS: 154 10*3/uL (ref 150–400)
RBC: 4.17 MIL/uL — AB (ref 4.22–5.81)
RDW: 13.7 % (ref 11.5–15.5)
WBC: 8.1 10*3/uL (ref 4.0–10.5)

## 2014-04-24 NOTE — Evaluation (Signed)
Occupational Therapy Evaluation Patient Details Name: Dakota Hall MRN: [id number] DOB: 10/18/1951 Today's Date: 04/24/2014    History of Present Illness HPI: Dakota Hall is a 63 y.o. male with PMH significant for Stroke , Diabetes, history of massive PE S/P infusion Catheter TPA, on coumadin, OSA on CPAP who was notice to have worsening slurred speech this morning day of admission. Patient wife relates that she has notice slurred speech intermittent over last 2 weeks, worse this morning. Had L MCA CVA earlier this year and had a CIR stay, then dc'd to SNF; Current admission is from SNF where he had been getting ongoing rehab   Clinical Impression   Pt currently presents w/ deficits as outlined in OT problem list (see below). He should benefit from acute OT to assist in maximizing independence with ADL's prior to d/c to next venue. Will follow acutely.    Follow Up Recommendations  SNF;Supervision/Assistance - 24 hour    Equipment Recommendations       Recommendations for Other Services       Precautions / Restrictions Precautions Precautions: Fall Restrictions Weight Bearing Restrictions: No      Mobility Bed Mobility Overal bed mobility: Needs Assistance Bed Mobility: Supine to Sit     Supine to sit: Mod assist     General bed mobility comments: cues for technique and physical assist to clear LEs over EOB; Noted good use of LLE to half-bridge to EOB; Pulled up to sit with LUE  Transfers Overall transfer level:  (Pt seen for bed level ADL retraining session today. Defer to PT assessment.)                    Balance Overall balance assessment: Needs assistance Sitting-balance support: Single extremity supported;Feet supported Sitting balance-Leahy Scale: Fair                                      ADL Overall ADL's : Needs assistance/impaired     Grooming: Wash/dry hands;Wash/dry face;Oral care;Applying deodorant;Minimal assistance;Set up;Bed  level   Upper Body Bathing: Set up;Minimal assitance;Bed level   Lower Body Bathing: Set up;Bed level;Maximal assistance;Total assistance Lower Body Bathing Details (indicate cue type and reason):  (Rolling to left using bed rails. Max-total A R LE) Upper Body Dressing : Maximal assistance;Bed level   Lower Body Dressing: Total assistance;Bed level;+2 for physical assistance   Toilet Transfer:  (TBA - currently w/ catheter)   Toileting- Clothing Manipulation and Hygiene: Bed level;Total assistance;+2 for physical assistance   Tub/ Shower Transfer:  (TBA)   Functional mobility during ADLs: Total assistance;+2 for physical assistance (Min A bed mobility to reposition in bed after bedlevel ADL's.. +2 A/physical assist all other mobility) General ADL Comments:  (Pt was educated in role of OT and OT goals for acute setting. He participated in full bed level ADL today/ADL retraining session. Pt will benefit from acute OT to assist in maximizing independence w/ ADL prior to anticipated return to SNF rehab as able. )     Vision                            Pertinent Vitals/Pain Pt denies pain. After RUE assessment, pt was noted to c.o R hand/wrist pain w/ gentle PROM for extension and repositioning. He did not rate, however, he was noted to report relief of pain after  repositioning.     Hand Dominance Right   Extremity/Trunk Assessment Upper Extremity Assessment Upper Extremity Assessment: RUE deficits/detail RUE Deficits / Details:  (Flaccid, hemiparesis, no AROM noted. Pt with painful digital extension passively, does better with slow graded movement and gradual repositioning. Should wear splint at noc.) RUE Sensation: decreased light touch;decreased proprioception RUE Coordination: decreased fine motor;decreased gross motor   Lower Extremity Assessment Lower Extremity Assessment: Defer to PT evaluation RLE Deficits / Details: Gross motor function decr; Noted incr tone with  passive ROM with hip/knee flexion and extension; Difficulty wit voluntary moveemnt RLE Sensation: decreased light touch;decreased proprioception RLE Coordination: decreased gross motor       Communication Communication Communication: Expressive difficulties   Cognition Arousal/Alertness: Awake/alert Behavior During Therapy: WFL for tasks assessed/performed Overall Cognitive Status: Within Functional Limits for tasks assessed                                     Home Living Family/patient expects to be discharged to:: Skilled nursing facility Living Arrangements: Spouse/significant other                                      Prior Functioning/Environment Level of Independence: Needs assistance  Gait / Transfers Assistance Needed: Since CVA last month pt amb short distance (40') with mod A and using rt AFO and rt hand splint to hold rt hand on walker.     Comments: Prior to stroke pt was totally indpendent.    OT Diagnosis: Generalized weakness;Hemiplegia dominant side;Paresis;Altered mental status   OT Problem List: Decreased strength;Decreased range of motion;Decreased activity tolerance;Impaired balance (sitting and/or standing);Decreased knowledge of precautions;Decreased knowledge of use of DME or AE;Decreased coordination;Impaired UE functional use;Impaired tone;Pain;Other (comment) (AMS)   OT Treatment/Interventions: Self-care/ADL training;Neuromuscular education;DME and/or AE instruction;Patient/family education;Cognitive remediation/compensation;Therapeutic activities;Splinting;Balance training    OT Goals(Current goals can be found in the care plan section) Acute Rehab OT Goals Patient Stated Goal: Agreeable to OT assessment and treatment session Time For Goal Achievement: 05/08/14 Potential to Achieve Goals: Good  OT Frequency: Min 3X/week   Barriers to D/C:                      End of Session Nurse Communication: Other (comment)  (Red/Rash noted on pt's back)  Activity Tolerance: Patient tolerated treatment well Patient left: in bed;with call bell/phone within reach;with bed alarm set   Time: 6387-5643 OT Time Calculation (min): 45 min Charges:  OT General Charges $OT Visit: 1 Procedure OT Evaluation $Initial OT Evaluation Tier I: 1 Procedure OT Treatments $Self Care/Home Management : 38-52 mins ( ) G-Codes:    Pratt Bress B Hartley Wyke 05-12-2014, 10:02 AM

## 2014-04-24 NOTE — Progress Notes (Signed)
  Echocardiogram 2D Echocardiogram has been performed.  Dakota Hall 04/24/2014, 10:35 AM

## 2014-04-24 NOTE — Progress Notes (Signed)
Speech Language Pathology Treatment: Dysphagia  Patient Details Name: KAISON PAS MRN: [id number] DOB: 09/15/1951 Today's Date: 04/24/2014 Time: 1100-1108 SLP Time Calculation (min): 8 min  Assessment / Plan / Recommendation Clinical Impression  Brief dysphagia treatment during cognitive assessment.  No indications of aspiration with thin via straw.  OT noted coughing during oral hygiene (brushing teeth at sink).  SLP educated pt. On importance of small sips and will see once more for upgrade to regular texture and ensure safety with straw sips thin.   HPI HPI: 63 year old male admitted 04/22/14 due to increased slurred speech, decreased word finding.  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 Pt was DC'd from CIR 03/03/14.  MRI revealed no new infarct, but evolution of left basal ganglia and corona radiata infarct.    Pertinent Vitals WDL  SLP Plan  Continue with current plan of care    Recommendations Diet recommendations: Dysphagia 3 (mechanical soft);Thin liquid Liquids provided via: Straw Medication Administration: Whole meds with liquid Supervision: Intermittent supervision to cue for compensatory strategies;Staff to assist with self feeding Compensations: Slow rate;Small sips/bites;Check for pocketing Postural Changes and/or Swallow Maneuvers: Seated upright 90 degrees              Oral Care Recommendations: Oral care BID Follow up Recommendations:  (TBD) Plan: Continue with current plan of care    GO     Breck Coons Kolbee Stallman M.Ed [employer] [phone]  04/24/2014

## 2014-04-24 NOTE — Evaluation (Signed)
Speech Language Pathology Evaluation Patient Details Name: Dakota Hall MRN: [id number] DOB: Mar 30, 1951 Today's Date: 04/24/2014 Time: 3903-0092 SLP Time Calculation (min): 27 min  Problem List:  Patient Active Problem List   Diagnosis Date Noted  . AKI (acute kidney injury) 04/22/2014  . Bradycardia 04/22/2014  . Encephalopathy 04/22/2014  . Malnutrition of moderate degree 03/15/2014  . Pulmonary emboli 03/14/2014  . Acute massive pulmonary embolism 03/14/2014  . CVA (cerebral infarction) 02/08/2014  . Depression with anxiety   . 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 Obesity   . Stroke 02/03/2014  . Unspecified cerebral artery occlusion with cerebral infarction 07/20/2013  . Stroke, small vessel 05/18/2012  . Slurred speech 05/15/2012  . HTN (hypertension) 05/15/2012  . Noncompliance with medication regimen 05/15/2012  . DM (diabetes mellitus) 05/15/2012  . Vision changes 05/15/2012   Past Medical History:  Past Medical History  Diagnosis Date  . Diabetes mellitus   . Hypertension   . Stroke July 2013    left paramedian pontine, incidental right parietal subcortical. both d/t small vessel disease  . Stroke March 2015    left basal ganglia secondary to small vessel disease  . Obesity   . Depression with anxiety   . OSA on CPAP   . Pulmonary emboli 02/2014   Past Surgical History:  Past Surgical History  Procedure Laterality Date  . Tonsillectomy    . Anal fissure repair     HPI:  63 year old male admitted 04/22/14 due to increased slurred speech, decreased word finding.  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 Pt was DC'd from CIR 03/03/14.  MRI revealed no new infarct, but evolution of left basal ganglia and corona radiata infarct.    Assessment / Plan / Recommendation Clinical Impression  Pt. seen for speech-language-cognitive assessment with residual impairments following CVA 01/2014.  He continues to exhibit challenges in working memory  (retrieval>storage), verbal and functional problem solving.  He reports worsening dysnomia, however, pt. did not demonstrate overt difficulty during evaluation in conversation (will continue to diagnostically assess).  Pt. would benefit from continued ST while on acute care.      SLP Assessment  Patient needs continued Speech Lanaguage Pathology Services    Follow Up Recommendations  Home health SLP [facility] versus SNF)    Frequency and Duration min 2x/week  2 weeks   Pertinent Vitals/Pain WDL   SLP Goals  SLP Goals Potential to Achieve Goals: Good Potential Considerations: Ability to learn/carryover information Progress/Goals/Alternative treatment plan discussed with pt/caregiver and they: Agree  SLP Evaluation Prior Functioning  Cognitive/Linguistic Baseline: Baseline deficits Baseline deficit details:  (working memory, dysnomia, problem solving) Type of Home: House  Lives With: Spouse Available Help at Discharge: Family;Available 24 hours/day Vocation: Retired (retired/unemployed)   Cognition  Overall Cognitive Status: History of cognitive impairments - at baseline (no family present to comment) Arousal/Alertness: Awake/alert Orientation Level: Oriented X4 (independently used calendar) Attention: Sustained Sustained Attention: Appears intact Memory: Impaired Memory Impairment: Storage deficit;Retrieval deficit;Decreased recall of new information;Decreased short term memory;Prospective memory Decreased Short Term Memory: Verbal basic Awareness: Impaired Awareness Impairment: Anticipatory impairment Problem Solving: Impaired Problem Solving Impairment: Verbal basic Executive Function: Self Monitoring;Self Correcting;Organizing Safety/Judgment:  (will continue to assess in tx)    Comprehension  Auditory Comprehension Overall Auditory Comprehension: Impaired Yes/No Questions: Within Functional Limits Commands: Impaired Multistep Basic Commands: 50-74% accurate  (66%) Conversation: Simple Interfering Components: Processing speed;Working memory EffectiveTechniques: Scientist, product/process development Discrimination: Not tested    Expression  Expression Primary Mode of Expression: Verbal Verbal Expression Overall Verbal Expression: Appears within functional limits for tasks assessed Initiation: No impairment Level of Generative/Spontaneous Verbalization: Conversation Repetition:  (NT) Naming:  (no evidence dysnomia during assessment) Pragmatics: No impairment Written Expression Dominant Hand: Right Written Expression: Exceptions to Saint Peters University HospitalWFL Self Formulation Ability: Letter   Oral / Motor Oral Motor/Sensory Function Overall Oral Motor/Sensory Function: Impaired at baseline Labial ROM: Reduced right Labial Symmetry: Abnormal symmetry right Lingual ROM: Within Functional Limits Lingual Symmetry: Within Functional Limits Lingual Strength: Within Functional Limits Facial ROM: Within Functional Limits Facial Symmetry: Within Functional Limits Motor Speech Overall Motor Speech: Impaired Respiration: Within functional limits Phonation: Normal Resonance: Within functional limits Articulation: Within functional limitis Intelligibility: Intelligibility reduced Word: 75-100% accurate Phrase: 75-100% accurate Sentence: 75-100% accurate Conversation: 75-100% accurate Motor Planning: Witnin functional limits        [employer] PlattLisa Willis Abdulhadi Stopa M.Ed [employer] Pager [phone]  04/24/2014

## 2014-04-24 NOTE — Progress Notes (Signed)
Utilization Review Completed.Kye Silverstein T Dowell6/06/2014  

## 2014-04-24 NOTE — Progress Notes (Signed)
ANTICOAGULATION CONSULT NOTE - Follow Up Consult  Pharmacy Consult for coumadin Indication: VTE treatment  Allergies  Allergen Reactions  . Penicillins Hives    Patient Measurements: Height: 5\' 8"  (172.7 cm) Weight: 195 lb 15.8 oz (88.9 kg) IBW/kg (Calculated) : 68.4   Vital Signs: Temp: 97.9 F (36.6 C) (06/08 0522) Temp src: Oral (06/08 0522) BP: 118/61 mmHg (06/08 0522) Pulse Rate: 53 (06/08 0522)  Labs:  Recent Labs  04/22/14 1405 04/22/14 1436 04/23/14 0250 04/24/14 0435  HGB 14.2 15.3  --  12.3*  HCT 41.8 45.0  --  36.5*  PLT 174  --   --  154  APTT 55*  --   --   --   LABPROT 38.2*  --  42.5* 34.9*  INR 4.10*  --  4.72* 3.65*  CREATININE 3.44* 3.70*  --  1.87*    Estimated Creatinine Clearance: 44.4 ml/min (by C-G formula based on Cr of 1.87).  Assessment: Patient is a 63 y.o Mon coumadin for hx PE.  INR remains supratherapeutic at 3.65.  Hgb down to 12.3 and plt down slightly to 154.  No new bleeding documented.  Goal of Therapy:  INR 2-3    Plan:  1) continue to hold coumadin for now  Dakota Hall P Joran Kallal 04/24/2014,9:24 AM

## 2014-04-24 NOTE — Progress Notes (Signed)
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 Patient also had recent Echo and carotid dopplers (Aprin 2015) -continue IVF's and supportive care -PT/OT and SPT -secondary prevention with coumadin. (dose per pharmacy)  2-Encephalopathy -as mentioned above. -no signs of infection -continue supportive care -mentation is back to baseline    3-Acute Renal Failure; multifactorial, secondary to decrease poor oral intake, continue use of diuretics and other nephrotoxic drugs. -will continue holding diuretics, diovan and metformin -continue IVF's -follow Cr trend -no infection on UA; renal US w/o hydronephrosis  4-History of Massive PE; Patient underwent successful placement bilateral Pulmonary artery infusion caths on 4/28 and he received a 12 hour TPA infusion. He was then discharge on coumadin.  -Will cont coumadin per pharmacy.    5-Bradycardia -TSH WNL -continue holding bisoprolol -HR improved  6-Diabetes; continue SSI; A1C 6.9  7-OSA; CPAP.  8-HLD: will continue statins   9-deconditioning: PT recommending SNF; patient has just been released about 1 week ago to home for SNF. Will discussed with patient and family regarding discharge plans. Patient needs 24/7 supervision/assistance/care.  -SW orders to explore option and discussed with family.   Code Status: Full Family Communication: no family at bedside Disposition Plan:  to be determine   Consultants:  Neurology   Procedures:  See below for x-ray reports  Antibiotics:  None   HPI/Subjective: Afebrile, Patient denies CP or SOB. Cr is improving and patient endorses feeling a lot better.  Objective: Filed Vitals:   04/24/14 0522  BP: 118/61  Pulse: 53  Temp: 97.9 F (36.6 C)  Resp: 16    Intake/Output Summary (Last 24 hours) at 04/24/14 1032 Last data filed at 04/24/14 0529  Gross per 24 hour  Intake    710 ml  Output   1100 ml  Net   -390 ml   Filed Weights   04/22/14 2042  Weight: 88.9 kg (195 lb 15.8 oz)    Exam:   General:  AAOX3, afebrile and feeling better; as per records dysarthria and right hemiparesis (appears at baseline)  Cardiovascular: S1 and S2, no rubs or gallops  Respiratory: CTA, no wheezing or crackles  Abdomen: soft, NT, ND, positive BS  Musculoskeletal: right hemiparesis; no joint swelling  Data Reviewed: Basic Metabolic Panel:  Recent Labs Lab 04/22/14 1405 04/22/14 1436 04/22/14 1921 04/24/14 0435  NA 141 145  --  144  K 3.5* 3.4*  --  3.5*  CL 99 99  --  104  CO2 27  --   --  24  GLUCOSE 128* 127*  --  110*  BUN 59* 52*  --  47*  CREATININE 3.44* 3.70*  --  1.87*  CALCIUM 10.2  --   --  9.4  MG  --   --  2.1  --    Liver Function Tests:  Recent Labs Lab 04/22/14 1405  AST 18  ALT 15  ALKPHOS 70  BILITOT 0.4  PROT 7.6  ALBUMIN 3.8   CBC:  Recent Labs Lab 04/22/14 1405 04/22/14 1436 04/24/14 0435  WBC 9.0  --  8.1  NEUTROABS 5.4  --   --   HGB 14.2 15.3 12.3*  HCT 41.8 45.0 36.5*  MCV 86.9  --  87.5  PLT 174  --  154   BNP (last 3 results)  Recent Labs  03/14/14 1130  PROBNP 5474.0*   CBG:  Recent Labs Lab 04/23/14 1036 04/23/14 1238 04/23/14 1633 04/23/14 2259 04/24/14 0559  GLUCAP 117* 110* 91 220* 109*    Recent Results (from the past 240 hour(s))  URINE CULTURE     Status: None   Collection Time    04/22/14  1:40 PM      Result Value Ref  Range Status   Specimen Description URINE, CLEAN CATCH   Final   Special Requests ADD [id number] [phone]   Final   Culture  Setup Time     Final   Value: 04/23/2014 02:42     Performed at [facility]   Colony Count     Final   Value: 2,000 COLONIES/ML     Performed at [facility]   Culture     Final   Value: INSIGNIFICANT GROWTH     Performed at [facility]   Report Status 04/24/2014 FINAL   Final  MRSA PCR SCREENING     Status: None   Collection Time    04/23/14  6:13 AM      Result Value Ref Range Status   MRSA by PCR NEGATIVE  NEGATIVE Final   Comment:            The GeneXpert MRSA Assay (FDA     approved for NASAL specimens     only), is one component of a     comprehensive MRSA colonization     surveillance program. It is not     intended to diagnose MRSA     infection nor to guide or     monitor treatment for     MRSA infections.     Studies: Dg Chest 2 View  04/22/2014   CLINICAL DATA:  Encephalopathy  EXAM: CHEST  2 VIEW  COMPARISON:  Chest radiograph March 14, 2014 and chest CT Mar 17, 2014  FINDINGS: There is no edema or consolidation. Heart is upper normal in size with normal pulmonary vascularity. No adenopathy. There is collapsed on vertebral body, a stable finding.  IMPRESSION: No edema or consolidation. Stable collapse of the L1 vertebral body.   Electronically Signed   By: Bretta Bang M.D.   On: 04/22/2014 20:05   Ct Head Wo Contrast  04/22/2014   CLINICAL DATA:  Altered mental status and facial droop  EXAM: CT HEAD WITHOUT CONTRAST  TECHNIQUE: Contiguous axial images were obtained from the base of the skull through the vertex without intravenous contrast. Study was obtained within 24 hr of patient's arrival at the emergency department.  COMPARISON:  March 14, 2014  FINDINGS: Mild generalized atrophy is stable. There is no demonstrable mass, hemorrhage, extra-axial fluid collection, or midline shift. There is small vessel disease throughout  the centra semiovale bilaterally, stable. There is also small vessel disease in the anterior left pons, stable. There is a subacute infarct in the posterior left lentiform nucleus region, showing evidence of evolution compared to the most recent prior study. No new/acute appearing infarct is seen on the current examination. Bony calvarium appears intact. The mastoid air cells are clear.  IMPRESSION: Evidence of further evolution of a subacute infarct in the posterior left lentiform nucleus. There is atrophy with  extensive small vessel disease in the anterior left pons and supratentorial white matter bilaterally. No acute infarct apparent. No hemorrhage or mass effect. 1   Electronically Signed   By: Bretta Bang M.D.   On: 04/22/2014 14:41   Mr Brain Wo Contrast  04/23/2014   CLINICAL DATA:  Stroke. Episode of slurred speech. Recent infarct. The symptoms have since resolved.  EXAM: MRI HEAD WITHOUT CONTRAST  MRA HEAD WITHOUT CONTRAST  TECHNIQUE: Multiplanar, multiecho pulse sequences of the brain and surrounding structures were obtained without intravenous contrast. Angiographic images of the head were obtained using MRA technique without contrast.  COMPARISON:  CT head 04/22/2014.  MRI brain 02/04/2014.  FINDINGS: MRI HEAD FINDINGS  The previous left basal ganglia and corona radiata radiata is again noted. There is some susceptibility along the infarct tract compatible with microhemorrhage, new since the prior exam.  No new areas of infarct are present. Extensive periventricular and subcortical white matter change is present bilaterally. Mild generalized atrophy is noted. White matter changes into the brainstem are stable. No acute hemorrhage or mass lesion is evident.  Flow is present in the major intracranial arteries. The globes and orbits are intact. The paranasal sinuses and mastoid air cells are clear.  MRA HEAD FINDINGS  The internal carotid arteries are within normal limits from the high cervical  segments through the ICA termini bilaterally. The A1 and M1 segments are normal. The anterior communicating artery is patent. The MCA bifurcations are intact. Medium and small vessel irregularity in the ACA and MCA branch vessels is similar to the prior exam.  The left vertebral artery is normal scratch the the left vertebral artery is dominant. There is mild narrowing of the non dominant right vertebral artery at the dural margin without change. The basilar artery is intact. Both posterior cerebral arteries originate from the basilar tip. Narrowing of the proximal posterior cerebral artery is more prominent on the left, without change.  IMPRESSION: 1. Evolution of the left basal ganglia and corona radiata infarct with interval development of petechial hemorrhage. There is no mass effect or surrounding edema. 2. No new infarct or acute hemorrhage. 3. Stable atrophy and diffuse white matter disease. This likely reflects the sequela of chronic microvascular ischemia. 4. Stable atherosclerotic changes without evidence for significant proximal stenosis, aneurysm, or branch vessel occlusion within the circle of Willis.   Electronically Signed   By: Gennette Pac M.D.   On: 04/23/2014 13:12   US Renal  04/23/2014   CLINICAL DATA:  Renal failure.  EXAM: RENAL/URINARY TRACT ULTRASOUND COMPLETE  COMPARISON:  12/09/2006  FINDINGS: Right Kidney:  Length: 11.7 cm. Echogenicity within normal limits. No mass or hydronephrosis visualized.  Left Kidney:  Length: 11.5 cm. Echogenicity within normal limits. No mass or hydronephrosis visualized.  Bladder:  Appears normal for degree of bladder distention.  IMPRESSION: Normal renal ultrasound.   Electronically Signed   By: Elberta Fortis M.D.   On: 04/23/2014 11:44   Mr Maxine Glenn Head/brain Wo Cm  04/23/2014   CLINICAL DATA:  Stroke. Episode of slurred speech. Recent infarct. The symptoms have since resolved.  EXAM: MRI HEAD WITHOUT CONTRAST  MRA HEAD WITHOUT CONTRAST  TECHNIQUE:  Multiplanar, multiecho pulse sequences of the brain and surrounding structures were obtained without intravenous contrast. Angiographic images of the head were obtained using MRA technique without contrast.  COMPARISON:  CT head 04/22/2014.  MRI brain 02/04/2014.  FINDINGS: MRI HEAD FINDINGS  The previous left basal ganglia and corona radiata radiata is  again noted. There is some susceptibility along the infarct tract compatible with microhemorrhage, new since the prior exam.  No new areas of infarct are present. Extensive periventricular and subcortical white matter change is present bilaterally. Mild generalized atrophy is noted. White matter changes into the brainstem are stable. No acute hemorrhage or mass lesion is evident.  Flow is present in the major intracranial arteries. The globes and orbits are intact. The paranasal sinuses and mastoid air cells are clear.  MRA HEAD FINDINGS  The internal carotid arteries are within normal limits from the high cervical segments through the ICA termini bilaterally. The A1 and M1 segments are normal. The anterior communicating artery is patent. The MCA bifurcations are intact. Medium and small vessel irregularity in the ACA and MCA branch vessels is similar to the prior exam.  The left vertebral artery is normal scratch the the left vertebral artery is dominant. There is mild narrowing of the non dominant right vertebral artery at the dural margin without change. The basilar artery is intact. Both posterior cerebral arteries originate from the basilar tip. Narrowing of the proximal posterior cerebral artery is more prominent on the left, without change.  IMPRESSION: 1. Evolution of the left basal ganglia and corona radiata infarct with interval development of petechial hemorrhage. There is no mass effect or surrounding edema. 2. No new infarct or acute hemorrhage. 3. Stable atrophy and diffuse white matter disease. This likely reflects the sequela of chronic microvascular  ischemia. 4. Stable atherosclerotic changes without evidence for significant proximal stenosis, aneurysm, or branch vessel occlusion within the circle of Willis.   Electronically Signed   By: Gennette Pachris  Mattern M.D.   On: 04/23/2014 13:12    Scheduled Meds: . amLODipine  10 mg Oral Daily  . ARIPiprazole  2.5 mg Oral Daily  . aspirin EC  81 mg Oral Daily  . feeding supplement (ENSURE COMPLETE)  237 mL Oral BID AC  . insulin aspart  0-9 Units Subcutaneous TID WC  . isosorbide-hydrALAZINE  1 tablet Oral BID  . pantoprazole  40 mg Oral QHS  . sertraline  250 mg Oral QHS  . simvastatin  20 mg Oral q1800  . sodium chloride  1,000 mL Intravenous Once  . Warfarin - Pharmacist Dosing Inpatient   Does not apply q1800   Continuous Infusions: . sodium chloride 75 mL/hr at 04/23/14 1950    Principal Problem:   Encephalopathy Active Problems:   HTN (hypertension)   DM (diabetes mellitus)   CVA (cerebral infarction)   Pulmonary emboli   AKI (acute kidney injury)   Bradycardia    Time spent: >30 minutes    Vassie Lollarlos Rosaisela Jamroz  [facility] Pager [phone]. If 7PM-7AM, please contact night-coverage at [email or web address], password [email or web address] 04/24/2014, 10:32 AM  LOS: 2 days

## 2014-04-25 DIAGNOSIS — I6350 Cerebral infarction due to unspecified occlusion or stenosis of unspecified cerebral artery: Secondary | ICD-10-CM

## 2014-04-25 LAB — BASIC METABOLIC PANEL
BUN: 34 mg/dL — AB (ref 6–23)
CHLORIDE: 101 meq/L (ref 96–112)
CO2: 25 mEq/L (ref 19–32)
CREATININE: 1.66 mg/dL — AB (ref 0.50–1.35)
Calcium: 9.4 mg/dL (ref 8.4–10.5)
GFR calc Af Amer: 49 mL/min — ABNORMAL LOW (ref >90)
GFR calc non Af Amer: 43 mL/min — ABNORMAL LOW (ref >90)
Glucose, Bld: 119 mg/dL — ABNORMAL HIGH (ref 70–99)
Potassium: 3.2 mEq/L — ABNORMAL LOW (ref 3.7–5.3)
Sodium: 142 mEq/L (ref 137–147)

## 2014-04-25 LAB — PROTIME-INR
INR: 1.41 (ref 0.00–1.49)
Prothrombin Time: 16.9 seconds — ABNORMAL HIGH (ref 11.6–15.2)

## 2014-04-25 LAB — GLUCOSE, CAPILLARY
GLUCOSE-CAPILLARY: 143 mg/dL — AB (ref 70–99)
Glucose-Capillary: 110 mg/dL — ABNORMAL HIGH (ref 70–99)
Glucose-Capillary: 149 mg/dL — ABNORMAL HIGH (ref 70–99)
Glucose-Capillary: 97 mg/dL (ref 70–99)

## 2014-04-25 MED ORDER — WARFARIN SODIUM 1 MG PO TABS
1.0000 mg | ORAL_TABLET | Freq: Once | ORAL | Status: AC
  Administered 2014-04-25: 1 mg via ORAL
  Filled 2014-04-25: qty 1

## 2014-04-25 NOTE — Progress Notes (Signed)
Speech Language Pathology Treatment: Dysphagia;Cognitive-Linquistic  Patient Details Name: Dakota Hall MRN: [id number] DOB: [birth year] Today's Date: 04/25/2014 Time: 5573-2202 SLP Time Calculation (min): 25 min  Assessment / Plan / Recommendation Clinical Impression  Pt is well known to this SLP from previous CIR admission, and appears to be at his baseline for swallowing function. He consumed regular textures and thin liquids via straw, alternating consistencies and utilizing recommended strategies with Mod I. Recommend to advance to regular textures and thin liquids, with no further f/u for swallowing needed at this time.  Pt participated in functional conversation with SLP regarding his current level of function. He required Min cues for use of speech intelligibility strategies, and he shared that his speech is worse this admission than it had been at home. Pt described his medical course in detail since d/c from CIR, with many details supported by documentation in chart. He demonstrated appropriate emergent and anticipatory awareness without cues from SLP. Pt will continue to benefit from SLP services to maximize speech intelligibility during this acute stay, and he will benefit from continued SLP services [facility] versus SNF) upon d/c.   HPI HPI: 63 year old male admitted 04/22/14 due to increased slurred speech, decreased word finding.  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 Pt was DC'd from CIR 03/03/14.  MRI revealed no new infarct, but evolution of left basal ganglia and corona radiata infarct.    Pertinent Vitals N/A  SLP Plan  Goals updated    Recommendations Diet recommendations: Regular;Thin liquid Liquids provided via: Cup;Straw Medication Administration: Whole meds with liquid Supervision: Staff to assist with self feeding (set-up assist PRN) Compensations: Slow rate;Small sips/bites;Check for pocketing Postural Changes and/or Swallow Maneuvers: Seated  upright 90 degrees              Oral Care Recommendations: Oral care BID Follow up Recommendations: Home health SLP;Skilled Nursing facility Plan: Goals updated    GO      Maxcine Ham, M.A. CCC-SLP [phone]  Maxcine Ham 04/25/2014, 2:22 PM

## 2014-04-25 NOTE — Progress Notes (Signed)
ANTICOAGULATION CONSULT NOTE - Follow Up Consult  Pharmacy Consult for coumadin Indication: hx PE  Allergies  Allergen Reactions  . Penicillins Hives    Patient Measurements: Height: 5\' 8"  (172.7 cm) Weight: 195 lb 15.8 oz (88.9 kg) IBW/kg (Calculated) : 68.4  Vital Signs: Temp: 97.7 F (36.5 C) (06/09 0423) Temp src: Oral (06/09 0423) BP: 124/67 mmHg (06/09 0423) Pulse Rate: 62 (06/09 0423)  Labs:  Recent Labs  04/22/14 1405 04/22/14 1436 04/23/14 0250 04/24/14 0435 04/25/14 0350  HGB 14.2 15.3  --  12.3*  --   HCT 41.8 45.0  --  36.5*  --   PLT 174  --   --  154  --   APTT 55*  --   --   --   --   LABPROT 38.2*  --  42.5* 34.9* 16.9*  INR 4.10*  --  4.72* 3.65* 1.41  CREATININE 3.44* 3.70*  --  1.87* 1.66*    Estimated Creatinine Clearance: 50 ml/min (by C-G formula based on Cr of 1.66).   Assessment: Patient is a 63 y.o M on coumadin for hx PE.  INR decreased from 3.65 to 1.41 today (no reversal given).    Goal of Therapy:  INR 2-3    Plan:  1) will resume coumadin today with 1mg  PO x1  Jillianne Gamino P Shante Archambeault 04/25/2014,10:49 AM

## 2014-04-25 NOTE — Progress Notes (Signed)
Clinical Social Work Department CLINICAL SOCIAL WORK PLACEMENT NOTE 04/25/2014  Patient:  GASPAR, CUSANO  Account Number:  [id number] Admit date:  04/22/2014  Clinical Social Worker:  Carren Rang  Date/time:  04/25/2014 10:26 AM  Clinical Social Work is seeking post-discharge placement for this patient at the following level of care:   SKILLED NURSING   (*CSW will update this form in Epic as items are completed)   04/25/2014  Patient/family provided with [facility] Department of Clinical Social Work's list of facilities offering this level of care within the geographic area requested by the patient (or if unable, by the patient's family).  04/25/2014  Patient/family informed of their freedom to choose among providers that offer the needed level of care, that participate in Medicare, Medicaid or managed care program needed by the patient, have an available bed and are willing to accept the patient.  04/25/2014  Patient/family informed of [facility]' ownership interest in [facility], as well as of the fact that they are under no obligation to receive care at this facility.  PASARR submitted to EDS on  PASARR number received on   FL2 transmitted to all facilities in geographic area requested by pt/family on  04/25/2014 FL2 transmitted to all facilities within larger geographic area on   Patient informed that his/her managed care company has contracts with or will negotiate with  certain facilities, including the following:     Patient/family informed of bed offers received:   Patient chooses bed at  Physician recommends and patient chooses bed at    Patient to be transferred to  on   Patient to be transferred to facility by  Patient and family notified of transfer on  Name of family member notified:    The following physician request were entered in Epic:   Additional Comments:  Maree Krabbe, MSW, [employer] [phone]

## 2014-04-25 NOTE — Progress Notes (Signed)
Clinical Social Work Department BRIEF PSYCHOSOCIAL ASSESSMENT 04/25/2014  Patient:  Dakota Hall, Dakota Hall     Account Number:  [id number]     Admit date:  04/22/2014  Clinical Social Worker:  Carren Rang  Date/Time:  04/25/2014 10:21 AM  Referred by:  Physician  Date Referred:  04/24/2014 Referred for  SNF Placement   Other Referral:   Interview type:  Other - See comment Other interview type:   CSW spoke to patient who then asked social worker to call wife    PSYCHOSOCIAL DATA Living Status:  WIFE Admitted from facility:   Level of care:   Primary support name:  Dakota Hall [phone] Primary support relationship to patient:  SPOUSE Degree of support available:   Good    CURRENT CONCERNS Current Concerns  Post-Acute Placement   Other Concerns:    SOCIAL WORK ASSESSMENT / PLAN Clinical Social Worker received referral for SNF placement at d/c. CSW introduced self and explained reason for visit. CSW explained SNF process to patient. Patient reported he is agreeable for SNF placement ibut asked social worker to call his wife to talk about SNF. CSW then called patient's wife over the phone and spoke to wife, Dakota Hall, about SNF. Mrs. Gorzynski states she is agreeable to SNF placement, and patient has been to [facility] in the past. Patient's wife states she was not thrilled with the care there, but that was one of the only ones in network with [place]. Patient's wife states that she does not think patient has any SNF days left on his insurance. CSW explained that social worker will fax information out and provide offers so patient and wife can choose a facility and social worker can start insurance auth. Patient's wife in agreement to plan.  CSW will complete FL2 for MD's signature and will update patient and family when bed offers are received.   Assessment/plan status:  Psychosocial Support/Ongoing Assessment of Needs Other assessment/ plan:   Information/referral to community  resources:   CSW information    PATIENT'S/FAMILY'S RESPONSE TO PLAN OF CARE: Patient's wife states that she is interested in rehab, but is not sure insurance will cover patient to stay at a snf, stating he has exhausted his snf days. CSW to follow up.        Maree Krabbe, MSW, [facility] [phone]

## 2014-04-25 NOTE — Care Management Note (Addendum)
    Page 1 of 2   04/27/2014     3:10:13 PM CARE MANAGEMENT NOTE 04/27/2014  Patient:  Dakota Hall, Dakota Hall   Account Number:  [id number]  Date Initiated:  04/25/2014  Documentation initiated by:  Milah Recht  Subjective/Objective Assessment:   Pt adm on 04/22/14 with encephalopathy, slurred speech and lethargy. PTA, pt resided at home with spouse.     Action/Plan:   PT/OT recommending SNF at dc; CSW consulted to facilitate dc to SNF when medically stable.   Anticipated DC Date:  04/27/2014   Anticipated DC Plan:  HOME W HOME HEALTH SERVICES  In-house referral  Clinical Social Worker      DC Planning Services  CM consult      [facility] Choice  Resumption Of Svcs/PTA Provider   Choice offered to / List presented to:  C-3 Spouse   DME arranged  HOSPITAL BED  [facility]      DME agency  [facility]     HH arranged  HH-1 RN  HH-2 PT  HH-3 OT  HH-5 SPEECH THERAPY  HH-4 NURSE'S AIDE      HH agency  [facility]   Status of service:  Completed, signed off Medicare Important Message given?   (If response is "NO", the following Medicare IM given date fields will be blank) Date Medicare IM given:   Date Additional Medicare IM given:    Discharge Disposition:  HOME W HOME HEALTH SERVICES  Per UR Regulation:  Reviewed for med. necessity/level of care/duration of stay  If discussed at Long Length of Stay Meetings, dates discussed:   04/27/2014    Comments:  04/27/14 Sidney Ace, RN, BSN [phone] Pt to dc on home Lovenox; PT COPAY WILL BE $39.39- NO PRIOR AUTH REQUIRED. Notified [facility]  that pt dc home today, and will need assistance with home Lovenox injections.  Faxed dc summary and orders for PT/INR draws to [facility] at [phone].  04/26/14 Sidney Ace, RN, BSN [phone] Per CSW, pt has no SNF days left.  Pt/wife unable to private pay for SNF, and refused LOG bed in [facility], due to long distance from their home.  Wife wishes to take pt home with [facility] services as  prior to admission.  Spoke with pt's wife Dakota Hall by phone [phone]); she states HH agency was [facility], and she wishes to cont with this agency.  Pt was receiving RN, PT, OT, ST, and HHA.  Pt has walker, BSC, shower chair and wheelchair at home.  Needs hosp bed and trapeze, per wife.  Spoke with attending MD; will arrange needed DME and resumption of HH services. Will plan dc for 04/27/14.  Md to call pt to discuss dc plans. Faxed resumption orders, H& P to [facility] at [phone]. Referral to [facility] for DME needs.  04/25/14 Sidney Ace, RN, BSN [phone] CM c/s for [facility] needs, but pt and wife desiring SNF for rehab.

## 2014-04-25 NOTE — Progress Notes (Addendum)
Update: CSW received a phone call from [facility] who states that they spoke with wife and wife would like to go with [facility]. SNF facility is starting the insurance authorization to determine if patient has SNF days left.  CSW provided bed offers to patient's wife over the phone. Patient's wife was unsure about which facility to choose and then stated she would like to speak to someone from [facility]. CSW informed patient's wife that social worker will have [address] [place], [place], reach out to patient's wife. Wife thanked Child psychotherapist and states she will most likely want to go with [facility].  Maree Krabbe, MSW, [facility] [phone]

## 2014-04-25 NOTE — Progress Notes (Signed)
Patient had already placed himself on CPAP when RT entered the room.  Patient stated he didn't need anything at this time.  RT will continue to monitor.

## 2014-04-25 NOTE — Progress Notes (Signed)
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 Patient also had recent Echo and carotid dopplers (Aprin 2015) -continue IVF's and supportive care -continue PT/OT as availability permits while inpatient -secondary prevention with coumadin. (dose per pharmacy)  2-Encephalopathy -as mentioned above. -no signs of infection -continue supportive care -mentation is back to baseline    3-Acute Renal Failure; multifactorial, secondary to decrease poor oral intake, continue use of diuretics and other nephrotoxic drugs. -will continue holding diuretics, diovan and metformin -continue IVF's, but now just 50cc/hr; patient encourage to increase PO intake -follow Cr trend -no infection on UA; renal US w/o hydronephrosis  4-History of Massive PE; Patient underwent successful placement bilateral Pulmonary artery infusion caths on 4/28 and he received a 12 hour TPA infusion. He was then discharge on coumadin.  -Will cont coumadin per pharmacy. -INR is subtherapeutic today    5-Bradycardia -TSH WNL -continue holding bisoprolol -HR improved  6-Diabetes; continue SSI; A1C 6.9  7-OSA; CPAP.  8-HLD: will continue statins   9-deconditioning: PT recommending SNF; patient has just been released about 1 week ago to home for SNF. Will discussed with patient and family regarding discharge plans. Patient needs 24/7 supervision/assistance/care.  -SW  orders to explore option and discussed with family.   Code Status: Full Family Communication: no family at bedside Disposition Plan: to be determine   Consultants:  Neurology   Procedures:  See below for x-ray reports  Antibiotics:  None   HPI/Subjective: Afebrile, Patient denies CP or SOB. Cr continue improving; no further episodes of significant bradycardia and with good BP control.  Objective: Filed Vitals:   04/25/14 0423  BP: 124/67  Pulse: 62  Temp: 97.7 F (36.5 C)  Resp: 18    Intake/Output Summary (Last 24 hours) at 04/25/14 0850 Last data filed at 04/25/14 0607  Gross per 24 hour  Intake   1440 ml  Output    900 ml  Net    540 ml   Filed Weights   04/22/14 2042  Weight: 88.9 kg (195 lb 15.8 oz)    Exam:   General:  AAOX3, afebrile and feeling better; as per records dysarthria and right hemiparesis (appears at baseline)  Cardiovascular: S1 and S2, no rubs or gallops  Respiratory: CTA, no wheezing or crackles  Abdomen: soft, NT, ND, positive BS  Musculoskeletal: right hemiparesis; no joint swelling  Data Reviewed: Basic Metabolic Panel:  Recent Labs Lab 04/22/14 1405 04/22/14 1436 04/22/14 1921 04/24/14 0435 04/25/14 0350  NA 141 145  --  144 142  K 3.5* 3.4*  --  3.5* 3.2*  CL 99 99  --  104 101  CO2 27  --   --  24 25  GLUCOSE 128* 127*  --  110* 119*  BUN 59* 52*  --  47* 34*  CREATININE 3.44* 3.70*  --  1.87* 1.66*  CALCIUM 10.2  --   --  9.4 9.4  MG  --   --  2.1  --   --    Liver Function Tests:  Recent Labs Lab 04/22/14 1405  AST 18  ALT 15  ALKPHOS 70  BILITOT 0.4  PROT 7.6  ALBUMIN 3.8   CBC:  Recent Labs Lab 04/22/14 1405 04/22/14 1436 04/24/14 0435  WBC 9.0  --  8.1  NEUTROABS 5.4  --   --   HGB 14.2 15.3 12.3*  HCT 41.8 45.0 36.5*  MCV 86.9  --  87.5  PLT 174  --  154   BNP (last 3 results)  Recent Labs  03/14/14 1130  PROBNP 5474.0*   CBG:  Recent Labs Lab 04/24/14 0559  04/24/14 1108 04/24/14 1613 04/24/14 2115 04/25/14 0626  GLUCAP 109* 128* 135* 116* 110*    Recent Results (from the past 240 hour(s))  URINE CULTURE     Status: None   Collection Time    04/22/14  1:40 PM      Result Value Ref Range Status   Specimen Description URINE, CLEAN CATCH   Final   Special Requests ADD [id number]   Final   Culture  Setup Time     Final   Value: 04/23/2014 02:42     Performed at [facility]   Colony Count     Final   Value: 2,000 COLONIES/ML     Performed at [facility]   Culture     Final   Value: INSIGNIFICANT GROWTH     Performed at [facility]   Report Status 04/24/2014 FINAL   Final  MRSA PCR SCREENING     Status: None   Collection Time    04/23/14  6:13 AM      Result Value Ref Range Status   MRSA by PCR NEGATIVE  NEGATIVE Final   Comment:            The GeneXpert MRSA Assay (FDA     approved for NASAL specimens     only), is one component of a     comprehensive MRSA colonization     surveillance program. It is not     intended to diagnose MRSA     infection nor to guide or     monitor treatment for     MRSA infections.     Studies: Mr Brain Wo Contrast  04/23/2014   CLINICAL DATA:  Stroke. Episode of slurred speech. Recent infarct. The symptoms have since resolved.  EXAM: MRI HEAD WITHOUT CONTRAST  MRA HEAD WITHOUT CONTRAST  TECHNIQUE: Multiplanar, multiecho pulse sequences of the brain and surrounding structures were obtained without intravenous contrast. Angiographic images of the head were obtained using MRA technique without contrast.  COMPARISON:  CT head 04/22/2014.  MRI brain 02/04/2014.  FINDINGS: MRI HEAD FINDINGS  The previous left basal ganglia and corona radiata radiata is again noted. There is some susceptibility along the infarct tract compatible with microhemorrhage, new since the prior exam.  No new areas of infarct are present. Extensive periventricular and subcortical white matter change is  present bilaterally. Mild generalized atrophy is noted. White matter changes into the brainstem are stable. No acute hemorrhage or mass lesion is evident.  Flow is present in the major intracranial arteries. The globes and orbits are intact. The paranasal sinuses and mastoid air cells are clear.  MRA HEAD FINDINGS  The internal carotid arteries are within normal limits from the high cervical segments through the ICA termini bilaterally. The A1 and M1 segments are normal. The anterior communicating artery is patent. The MCA bifurcations are intact. Medium and small vessel irregularity in the ACA and MCA branch  vessels is similar to the prior exam.  The left vertebral artery is normal scratch the the left vertebral artery is dominant. There is mild narrowing of the non dominant right vertebral artery at the dural margin without change. The basilar artery is intact. Both posterior cerebral arteries originate from the basilar tip. Narrowing of the proximal posterior cerebral artery is more prominent on the left, without change.  IMPRESSION: 1. Evolution of the left basal ganglia and corona radiata infarct with interval development of petechial hemorrhage. There is no mass effect or surrounding edema. 2. No new infarct or acute hemorrhage. 3. Stable atrophy and diffuse white matter disease. This likely reflects the sequela of chronic microvascular ischemia. 4. Stable atherosclerotic changes without evidence for significant proximal stenosis, aneurysm, or branch vessel occlusion within the circle of Willis.   Electronically Signed   By: Gennette Pac M.D.   On: 04/23/2014 13:12   US Renal  04/23/2014   CLINICAL DATA:  Renal failure.  EXAM: RENAL/URINARY TRACT ULTRASOUND COMPLETE  COMPARISON:  12/09/2006  FINDINGS: Right Kidney:  Length: 11.7 cm. Echogenicity within normal limits. No mass or hydronephrosis visualized.  Left Kidney:  Length: 11.5 cm. Echogenicity within normal limits. No mass or hydronephrosis  visualized.  Bladder:  Appears normal for degree of bladder distention.  IMPRESSION: Normal renal ultrasound.   Electronically Signed   By: Elberta Fortis M.D.   On: 04/23/2014 11:44   Mr Maxine Glenn Head/brain Wo Cm  04/23/2014   CLINICAL DATA:  Stroke. Episode of slurred speech. Recent infarct. The symptoms have since resolved.  EXAM: MRI HEAD WITHOUT CONTRAST  MRA HEAD WITHOUT CONTRAST  TECHNIQUE: Multiplanar, multiecho pulse sequences of the brain and surrounding structures were obtained without intravenous contrast. Angiographic images of the head were obtained using MRA technique without contrast.  COMPARISON:  CT head 04/22/2014.  MRI brain 02/04/2014.  FINDINGS: MRI HEAD FINDINGS  The previous left basal ganglia and corona radiata radiata is again noted. There is some susceptibility along the infarct tract compatible with microhemorrhage, new since the prior exam.  No new areas of infarct are present. Extensive periventricular and subcortical white matter change is present bilaterally. Mild generalized atrophy is noted. White matter changes into the brainstem are stable. No acute hemorrhage or mass lesion is evident.  Flow is present in the major intracranial arteries. The globes and orbits are intact. The paranasal sinuses and mastoid air cells are clear.  MRA HEAD FINDINGS  The internal carotid arteries are within normal limits from the high cervical segments through the ICA termini bilaterally. The A1 and M1 segments are normal. The anterior communicating artery is patent. The MCA bifurcations are intact. Medium and small vessel irregularity in the ACA and MCA branch vessels is similar to the prior exam.  The left vertebral artery is normal scratch the the left vertebral artery is dominant. There is mild narrowing of the non dominant right vertebral artery at the dural margin without change. The basilar artery is intact. Both posterior cerebral arteries originate from the basilar tip. Narrowing of the proximal  posterior cerebral artery is more prominent on the left, without change.  IMPRESSION: 1. Evolution of the left basal ganglia and corona radiata infarct with interval development of petechial hemorrhage. There is no mass effect or surrounding edema. 2. No new infarct or acute hemorrhage. 3. Stable atrophy and diffuse white matter disease. This likely reflects the sequela of chronic microvascular ischemia. 4. Stable atherosclerotic changes without evidence for significant proximal stenosis, aneurysm, or  branch vessel occlusion within the circle of Willis.   Electronically Signed   By: Gennette Pachris  Mattern M.D.   On: 04/23/2014 13:12    Scheduled Meds: . amLODipine  10 mg Oral Daily  . ARIPiprazole  2.5 mg Oral Daily  . aspirin EC  81 mg Oral Daily  . feeding supplement (ENSURE COMPLETE)  237 mL Oral BID AC  . insulin aspart  0-9 Units Subcutaneous TID WC  . isosorbide-hydrALAZINE  1 tablet Oral BID  . pantoprazole  40 mg Oral QHS  . sertraline  250 mg Oral QHS  . simvastatin  20 mg Oral q1800  . sodium chloride  1,000 mL Intravenous Once  . Warfarin - Pharmacist Dosing Inpatient   Does not apply q1800   Continuous Infusions: . sodium chloride 50 mL/hr at 04/25/14 0401    Principal Problem:   Encephalopathy Active Problems:   HTN (hypertension)   DM (diabetes mellitus)   CVA (cerebral infarction)   Pulmonary emboli   AKI (acute kidney injury)   Bradycardia    Time spent: >30 minutes    Vassie Lollarlos Evoleht Hovatter  [facility] Pager [phone]. If 7PM-7AM, please contact night-coverage at [email or web address], password [email or web address] 04/25/2014, 8:50 AM  LOS: 3 days

## 2014-04-25 NOTE — Progress Notes (Signed)
Physical Therapy Treatment Patient Details Name: Dakota Hall MRN: [id number] DOB: 09/14/1951 Today's Date: 04/25/2014    History of Present Illness HPI: Dakota Hall is a 63 y.o. male with PMH significant for Stroke , Diabetes, history of massive PE S/P infusion Catheter TPA, on coumadin, OSA on CPAP who was notice to have worsening slurred speech this morning day of admission. Patient wife relates that she has notice slurred speech intermittent over last 2 weeks, worse this morning. 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  Less assist required today and less posterior leaning throughout.  He continues to be appropriate to return to SNF level rehab.  PT to continue to follow acutely.  May try hemi walker next session with Two person assist to try to progress to actual forward gait.   Follow Up Recommendations  SNF;Supervision/Assistance - 24 hour     Equipment Recommendations  None recommended by PT    Recommendations for Other Services   NA     Precautions / Restrictions Precautions Precautions: Fall Precaution Comments: right hemi Required Braces or Orthoses: Other Brace/Splint Other Brace/Splint: has right hand splint, see OT notes for details.     Mobility  Bed Mobility Overal bed mobility: Needs Assistance Bed Mobility: Supine to Sit     Supine to sit: Mod assist     General bed mobility comments: Mod assist to support upper trunk to get to sitting EOB and to assist in weight shift of hips to EOB.  Cues to attend to his right hand and protect it while moving.   Transfers Overall transfer level: Needs assistance Equipment used: 2 person hand held assist Transfers: Sit to/from [employer]Stand;Stand Pivot Transfers Sit to Stand: +2 physical assistance;Mod assist Stand pivot transfers: +2 physical  assistance;Mod assist       General transfer comment: Pt stood with two person mod assist from bed with right knee  Ambulation/Gait Ambulation/Gait assistance: +2 physical assistance;Mod assist Ambulation Distance (Feet): 3 Feet Assistive device: 2 person hand held assist Gait Pattern/deviations: Step-to pattern;Decreased dorsiflexion - right;Decreased weight shift to right;Shuffle     General Gait Details: 5-6 pivotal steps to recliner chair from bed.  Pt needed support at trunk and manual asisst and verbal cues for weight shift left to unweight right leg so it could be manually moved and blocked by second therapist.  Pt continues to have mild posterior lean, but significantly better than last session.            Balance Overall balance assessment: Needs assistance Sitting-balance support: Single extremity supported;Feet supported Sitting balance-Leahy Scale: Good   Postural control: Posterior lean Standing balance support: Bilateral upper extremity supported Standing balance-Leahy Scale: Poor Standing balance comment: two person mod assist in standing with right leg blocked.                     Cognition Arousal/Alertness: Awake/alert Behavior During Therapy: WFL for tasks assessed/performed Overall Cognitive Status: History of cognitive impairments - at baseline                             Pertinent Vitals/Pain See vitals flow sheet.            PT Goals (  current goals can now be found in the care plan section) Acute Rehab PT Goals Patient Stated Goal: to get better and walking again Progress towards PT goals: Progressing toward goals    Frequency  Min 3X/week    PT Plan Current plan remains appropriate    Co-evaluation PT/OT/SLP Co-Evaluation/Treatment: Yes Reason for Co-Treatment: For patient/therapist safety PT goals addressed during session: Mobility/safety with mobility;Balance;Strengthening/ROM OT goals addressed during session:  ADL's and self-care;Other (comment) (Mobility/safety w/ mobility in preparation for increased participation w/ ADL transfers)     End of Session Equipment Utilized During Treatment: Gait belt Activity Tolerance: Patient tolerated treatment well Patient left: in chair;Other (comment) (with OT finishing OT treatment session)     Time: 0086-7619 PT Time Calculation (min): 11 min  Charges:  $Therapeutic Activity: 8-22 mins                      Shantella Blubaugh B. Trajon Rosete, PT, DPT [phone]   04/25/2014, 3:19 PM

## 2014-04-25 NOTE — Progress Notes (Signed)
Occupational Therapy Treatment Patient Details Name: Dakota Hall MRN: [id number] DOB: 12/24/1950 Today's Date: 04/25/2014    History of present illness HPI: Dakota Hall is a 63 y.o. male with PMH significant for Stroke , Diabetes, history of massive PE S/P infusion Catheter TPA, on coumadin, OSA on CPAP who was notice to have worsening slurred speech this morning day of admission. Patient wife relates that she has notice slurred speech intermittent over last 2 weeks, worse this morning. 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 Pt required Mod-max A +2 and assistance to block R knee during transfer. Pt also seen for positioning of RUE and gentle PROM R hand to encourage increased passive extension of digits. Pt was then placed in pre-fab resting hand splint for better positioning of his RUE.   Follow Up Recommendations       Equipment Recommendations       Recommendations for Other Services      Precautions / Restrictions Precautions Precautions: Fall       Mobility Bed Mobility Overal bed mobility: Needs Assistance Bed Mobility: Supine to Sit     Supine to sit: Mod assist     General bed mobility comments: cues for technique and physical assist to clear LEs over EOB; Noted good use of LLE to half-bridge to EOB; Pulled up to sit with LUE  Transfers Overall transfer level: Needs assistance Equipment used: 2 person hand held assist Transfers: Sit to/from [employer]Stand;Stand Pivot Transfers Sit to Stand: Mod assist;+2 physical assistance Stand pivot transfers: Max assist;+2 physical assistance       General transfer comment: Pt stood from EOB, w/ enough strength/power in LLE to come into standing. R knee was blocked in standing, pt  cont to demonstrate posterior lean, however this is improving. Able to take small pivot steps from bed to chair given bil UE support (R support given at shoulder girdle) and at gait belt    Balance Overall balance assessment: Needs assistance Sitting-balance support: Single extremity supported;Feet supported Sitting balance-Leahy Scale: Good   Postural control: Posterior lean Standing balance support: Bilateral upper extremity supported;During functional activity   Standing balance comment: Posterior lean, R knee blocked in standing. VC's for leaning forward w/ trunk and shifting weight onto his left LE during static standing & SPT to chair.                   ADL Overall ADL's : Needs assistance/impaired     Grooming: Wash/dry hands;Minimal assistance;Set up;Sitting Grooming Details (indicate cue type and reason):  (Pt sitting up in recliner for basic grooming)                             Functional mobility during ADLs: Maximal assistance;+2 for physical assistance General ADL Comments: Pt seen for treatment session as per comment section below. Pt also seen for positioning of RUE and gentle PROM R hand to encourage increased passive extension of digits. Pt was then placed in pre-fab resting hand splint for better positioning of his RUE. Pt states that he prefers to wear splint during the day and at noc for support/positioning. Pt required Min A to don splint after gentle  PROM for extension. Pt's RUE was also propped up/elevated on pillow and pt was educated to keep arm supported/elevated when ever possible (sitting in chair, lying in bed etc) he verbalized understanding of this. Initial co-tx w/ PT for functional mobility/transfer bed to chair w/ OT focus on ADL transfers and increased participation during transfers. Pt required Mod-max A +2 and assistance to block R knee during transfer.      Vision                     Perception     Praxis       Cognition   Behavior During Therapy: WFL for tasks assessed/performed Overall Cognitive Status: No family/caregiver present to determine baseline cognitive functioning                       Extremity/Trunk Assessment               Exercises     Shoulder Instructions       General Comments      Pertinent Vitals/ Pain       Pt denies pain  Home Living                                          Prior Functioning/Environment              Frequency       Progress Toward Goals  OT Goals(current goals can now be found in the care plan section)  Progress towards OT goals: Progressing toward goals     Plan Discharge plan remains appropriate    Co-evaluation    PT/OT/SLP Co-Evaluation/Treatment: Yes     OT goals addressed during session: ADL's and self-care;Other (comment) (Mobility/safety w/ mobility in preparation for increased participation w/ ADL transfers)      End of Session Equipment Utilized During Treatment: Gait belt   Activity Tolerance Patient tolerated treatment well   Patient Left in chair;with call bell/phone within reach   Nurse Communication Other (comment) (Pt up in chair)        Time: 1017-5102 OT Time Calculation (min): 32 min  Charges: OT General Charges $OT Visit: 1 Procedure OT Treatments $Self Care/Home Management : 8-22 mins (15 min) $Therapeutic Activity: 8-22 mins (15 min)  Amy B Barnhill 04/25/2014, 12:47 PM

## 2014-04-26 LAB — BASIC METABOLIC PANEL
BUN: 24 mg/dL — AB (ref 6–23)
CALCIUM: 9.2 mg/dL (ref 8.4–10.5)
CO2: 25 mEq/L (ref 19–32)
CREATININE: 1.42 mg/dL — AB (ref 0.50–1.35)
Chloride: 103 mEq/L (ref 96–112)
GFR, EST AFRICAN AMERICAN: 60 mL/min — AB (ref >90)
GFR, EST NON AFRICAN AMERICAN: 51 mL/min — AB (ref >90)
Glucose, Bld: 120 mg/dL — ABNORMAL HIGH (ref 70–99)
Potassium: 3.2 mEq/L — ABNORMAL LOW (ref 3.7–5.3)
Sodium: 145 mEq/L (ref 137–147)

## 2014-04-26 LAB — GLUCOSE, CAPILLARY
GLUCOSE-CAPILLARY: 87 mg/dL (ref 70–99)
Glucose-Capillary: 107 mg/dL — ABNORMAL HIGH (ref 70–99)
Glucose-Capillary: 159 mg/dL — ABNORMAL HIGH (ref 70–99)
Glucose-Capillary: 145 mg/dL — ABNORMAL HIGH (ref 70–99)

## 2014-04-26 LAB — CBC
HEMATOCRIT: 35.1 % — AB (ref 39.0–52.0)
Hemoglobin: 11.8 g/dL — ABNORMAL LOW (ref 13.0–17.0)
MCH: 29.6 pg (ref 26.0–34.0)
MCHC: 33.6 g/dL (ref 30.0–36.0)
MCV: 88.2 fL (ref 78.0–100.0)
Platelets: 147 10*3/uL — ABNORMAL LOW (ref 150–400)
RBC: 3.98 MIL/uL — ABNORMAL LOW (ref 4.22–5.81)
RDW: 13.4 % (ref 11.5–15.5)
WBC: 8 10*3/uL (ref 4.0–10.5)

## 2014-04-26 LAB — PROTIME-INR
INR: 1.3 (ref 0.00–1.49)
PROTHROMBIN TIME: 15.9 s — AB (ref 11.6–15.2)

## 2014-04-26 MED ORDER — SERTRALINE HCL 50 MG PO TABS: 250.0000 mg | ORAL_TABLET | Freq: Every day | ORAL | Status: DC

## 2014-04-26 MED ORDER — ISOSORB DINITRATE-HYDRALAZINE 20-37.5 MG PO TABS: 1.0000 | ORAL_TABLET | Freq: Two times a day (BID) | ORAL | Status: DC

## 2014-04-26 MED ORDER — POTASSIUM CHLORIDE CRYS ER 20 MEQ PO TBCR
40.0000 meq | EXTENDED_RELEASE_TABLET | Freq: Once | ORAL | Status: AC
  Administered 2014-04-26: 40 meq via ORAL
  Filled 2014-04-26 (×2): qty 2

## 2014-04-26 MED ORDER — WARFARIN SODIUM 2 MG PO TABS
2.0000 mg | ORAL_TABLET | Freq: Once | ORAL | Status: AC
Start: 2014-04-26 — End: 2014-04-26
  Administered 2014-04-26: 2 mg via ORAL
  Filled 2014-04-26: qty 1

## 2014-04-26 NOTE — Progress Notes (Signed)
ANTICOAGULATION CONSULT NOTE - Follow Up Consult  Pharmacy Consult for coumadin Indication: hx PE  Allergies  Allergen Reactions  . Penicillins Hives    Patient Measurements: Height: 5\' 8"  (172.7 cm) Weight: 195 lb 15.8 oz (88.9 kg) IBW/kg (Calculated) : 68.4   Vital Signs: Temp: 98.7 F (37.1 C) (06/10 0548) Temp src: Oral (06/10 0548) BP: 128/79 mmHg (06/10 0548) Pulse Rate: 65 (06/10 0548)  Labs:  Recent Labs  04/24/14 0435 04/25/14 0350 04/26/14 0344  HGB 12.3*  --  11.8*  HCT 36.5*  --  35.1*  PLT 154  --  147*  LABPROT 34.9* 16.9* 15.9*  INR 3.65* 1.41 1.30  CREATININE 1.87* 1.66* 1.42*    Estimated Creatinine Clearance: 58.4 ml/min (by C-G formula based on Cr of 1.42).  Assessment: Patient is a 63 y.o M on coumadin for hx PE.  Coumadin resumed yesterday after placing it on hold since admission d/t supratherapeutic levels.  INR continues to trend down today with 1mg  dose given last night.  No bleeding documented.  Goal of Therapy:  INR 2-3    Plan:  1) coumadin 2 mg PO x1 today  Dakota Hall P 04/26/2014,8:36 AM

## 2014-04-26 NOTE — Progress Notes (Signed)
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 Patient also had recent Echo and carotid dopplers (Aprin 2015) -continue IVF's and supportive care -continue PT/OT as availability permits while inpatient -secondary prevention with coumadin. (dose per pharmacy)  2-Encephalopathy -as mentioned above. -no signs of infection -continue supportive care -mentation is back to baseline    3-Acute Renal Failure; multifactorial, secondary to decrease poor oral intake, continue use of diuretics and other nephrotoxic drugs. -will continue holding diuretics, diovan and metformin -continue IVF's, but now just 50cc/hr; patient encourage to increase PO intake -follow Cr trend -no infection on UA; renal US w/o hydronephrosis  4-History of Massive PE; Patient underwent successful placement bilateral Pulmonary artery infusion caths on 4/28 and he received a 12 hour TPA infusion. He was then discharge on coumadin.  -Will cont coumadin per pharmacy. -INR is subtherapeutic today    5-Bradycardia -TSH WNL -continue holding bisoprolol -HR improved  6-Diabetes; continue SSI; A1C 6.9  7-OSA; CPAP.  8-HLD: will continue statins   9-deconditioning: PT recommending SNF; patient has just been released about 1 week ago to home for SNF. Discussed with the patient the discharge plans. Wife does not want him to go to [facility] as its too far for her to  visit him. She is going to get him additional help at home .    Code Status: Full Family Communication: no family at bedside, called wife and left message .  Disposition Plan: possibly home with home pt/ot.    Consultants:  Neurology   Procedures:  See below for x-ray reports  Antibiotics:  None   HPI/Subjective: Afebrile, Patient denies CP or SOB. Cr continue improving; no further episodes of significant bradycardia and with good BP control.  Objective: Filed Vitals:   04/26/14 1022  BP: 152/83  Pulse:   Temp:   Resp:     Intake/Output Summary (Last 24 hours) at 04/26/14 1543 Last data filed at 04/26/14 0730  Gross per 24 hour  Intake    600 ml  Output   1450 ml  Net   -850 ml   Filed Weights   04/22/14 2042  Weight: 88.9 kg (195 lb 15.8 oz)    Exam:   General:  AAOX3, afebrile and feeling better; as per records dysarthria and right hemiparesis (appears at baseline)  Cardiovascular: S1 and S2, no rubs or gallops  Respiratory: CTA, no wheezing or crackles  Abdomen: soft, NT, ND, positive BS  Musculoskeletal: right hemiparesis; no joint swelling  Data Reviewed: Basic Metabolic Panel:  Recent Labs Lab 04/22/14 1405 04/22/14 1436 04/22/14 1921 04/24/14 0435 04/25/14 0350 04/26/14 0344  NA 141 145  --  144 142 145  K 3.5* 3.4*  --  3.5* 3.2* 3.2*  CL 99 99  --  104 101 103  CO2 27  --   --  24 25 25   GLUCOSE 128* 127*  --  110* 119* 120*  BUN 59* 52*  --  47* 34* 24*  CREATININE 3.44* 3.70*  --  1.87* 1.66* 1.42*  CALCIUM 10.2  --   --  9.4 9.4 9.2  MG  --   --  2.1  --   --   --    Liver Function Tests:  Recent Labs Lab 04/22/14 1405  AST 18  ALT 15  ALKPHOS 70  BILITOT 0.4  PROT 7.6  ALBUMIN 3.8   CBC:  Recent Labs Lab 04/22/14 1405 04/22/14 1436 04/24/14 0435 04/26/14 0344  WBC 9.0  --  8.1 8.0  NEUTROABS 5.4  --   --   --   HGB 14.2 15.3 12.3* 11.8*  HCT 41.8 45.0 36.5* 35.1*  MCV 86.9  --  87.5 88.2  PLT 174  --   154 147*   BNP (last 3 results)  Recent Labs  03/14/14 1130  PROBNP 5474.0*   CBG:  Recent Labs Lab 04/25/14 1123 04/25/14 1643 04/25/14 2110 04/26/14 0606 04/26/14 1101  GLUCAP 149* 143* 97 107* 159*    Recent Results (from the past 240 hour(s))  URINE CULTURE     Status: None   Collection Time    04/22/14  1:40 PM      Result Value Ref Range Status   Specimen Description URINE, CLEAN CATCH   Final   Special Requests ADD [id number] [phone] [id number]   Final   Culture  Setup Time     Final   Value: 04/23/2014 02:42     Performed at [facility]   Colony Count     Final   Value: 2,000 COLONIES/ML     Performed at [facility]   Culture     Final   Value: INSIGNIFICANT GROWTH     Performed at [facility]   Report Status 04/24/2014 FINAL   Final  MRSA PCR SCREENING     Status: None   Collection Time    04/23/14  6:13 AM      Result Value Ref Range Status   MRSA by PCR NEGATIVE  NEGATIVE Final   Comment:            The GeneXpert MRSA Assay (FDA     approved for NASAL specimens     only), is one component of a     comprehensive MRSA colonization     surveillance program. It is not     intended to diagnose MRSA     infection nor to guide or     monitor treatment for     MRSA infections.     Studies: No results found.  Scheduled Meds: . amLODipine  10 mg Oral Daily  . ARIPiprazole  2.5 mg Oral Daily  . aspirin EC  81 mg Oral Daily  . feeding supplement (ENSURE COMPLETE)  237 mL Oral BID AC  . insulin aspart  0-9 Units Subcutaneous TID WC  . isosorbide-hydrALAZINE  1 tablet Oral BID  . pantoprazole  40 mg Oral QHS  . sertraline  250 mg Oral QHS  . simvastatin  20 mg Oral q1800  . sodium chloride  1,000 mL Intravenous Once  . warfarin  2 mg Oral ONCE-1800  . Warfarin - Pharmacist Dosing Inpatient   Does not apply q1800   Continuous Infusions:    Principal Problem:   Encephalopathy Active Problems:   HTN (hypertension)   DM (diabetes  mellitus)   CVA (cerebral infarction)   Pulmonary emboli   AKI (acute kidney injury)   Bradycardia    Time spent: >30 minutes    Meghan Warshawsky  [facility] Pager [phone] If 7PM-7AM, please contact night-coverage at [email or web address], password  [email or web address] 04/26/2014, 3:43 PM  LOS: 4 days

## 2014-04-26 NOTE — Progress Notes (Signed)
Occupational Therapy Treatment Patient Details Name: Dakota Hall MRN: [id number] DOB: February 05, 1951 Today's Date: 04/26/2014    History of present illness HPI: DECAMERON HOOD is a 63 y.o. male with PMH significant for Stroke , Diabetes, history of massive PE S/P infusion Catheter TPA, on coumadin, OSA on CPAP who was notice to have worsening slurred speech this morning day of admission. Patient wife relates that she has notice slurred speech intermittent over last 2 weeks, worse this morning. 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 Pt with Rt UE edema and poor fit in splint. Focus of session was mobility, discussion with wife about home care and Rt UE edema management. Wife reports needing hoyer, Rt arm trough and hospital. Wife reports current hospital bed is only 18 inches off ground in highest setting. Pt currently requires total +2 (A) to transfer safely. Pt is risk for falls or prolonged bedrest without hoyer equipment.  Follow Up Recommendations  Home health OT;Other (comment) (SNF denied due to insurance coverage)    Equipment Recommendations  Other (comment) (hoyer lift, Rt arm trough w/c, hospital bed)    Recommendations for Other Services      Precautions / Restrictions Precautions Precautions: Fall Precaution Comments: right hemi Required Braces or Orthoses: Other Brace/Splint Other Brace/Splint: has Rt hand splint       Mobility Bed Mobility Overal bed mobility: Needs Assistance Bed Mobility: Supine to Sit     Supine to sit: Mod assist;HOB elevated     General bed mobility comments: bedrail used with LT UE and needed (A) to progress Rt hip to EOB and position into static sitting  Transfers Overall transfer level: Needs assistance Equipment used: 2 person hand  held assist Transfers: Sit to/from [employer] Sit to Stand: +2 physical assistance;Min assist Stand pivot transfers: +2 physical assistance;Min assist       General transfer comment: pt is able to power up into standing with Rt LE blocked. Pt progressed to chair on lt side    Balance Overall balance assessment: Needs assistance Sitting-balance support: Single extremity supported;Feet supported Sitting balance-Leahy Scale: Good     Standing balance support: Single extremity supported;During functional activity Standing balance-Leahy Scale: Fair Standing balance comment: pt required Rt LE blocked but able to power up into static standing. Pt able to stand pivot to the Lt side                    ADL                                         General ADL Comments: Pt currently with pressure on the 1st and 5th digit in splint. splint removed and redness remained greater than 5 minutes. Rt UE with edema noted and pressure spots from straps. Pt placed in chair on x3 elevated pillows with hand placed on rolled wash cloth to provide digit fleixon at MCP. Goal is to reduce edema first and then reattempt the splint. pt with digit extension only in splint at this time due to edema and poor alignment in spint. pt is high risk for pressure wound and contracture in  current positioning. Pt provided PROM to all digits and demonstrate pain with minimal ROM. Pt tolerating ROM better with personal LT UE ROM. OT helping guide pt with LT UE to provided ROM to RT UE. pt provided wash cloth between digits due to dry skin and risk for yeast infection due to poor hand hyigene. Wife called regarding splint and hand hygiene. OT also called wife regarding equipment needs and w/c needs. Wife requesting hoyer lift and Rt arm trough for w/c. [facility] called and to call wife directly to discuss Rt arm trough. [facility] is the provider of the w/c prior to admission.       Vision                      Perception     Praxis      Cognition   Behavior During Therapy: WFL for tasks assessed/performed Overall Cognitive Status: History of cognitive impairments - at baseline                       Extremity/Trunk Assessment               Exercises     Shoulder Instructions       General Comments      Pertinent Vitals/ Pain       VSS  Home Living                                          Prior Functioning/Environment              Frequency Min 2X/week     Progress Toward Goals  OT Goals(current goals can now be found in the care plan section)  Progress towards OT goals: Progressing toward goals  Acute Rehab OT Goals Patient Stated Goal: to get better and walking again Time For Goal Achievement: 05/08/14 Potential to Achieve Goals: Good ADL Goals Pt Will Perform Grooming: with set-up;sitting;with min guard assist Pt Will Perform Upper Body Bathing: with set-up;with min assist;sitting Pt Will Perform Lower Body Bathing: with mod assist;sitting/lateral leans;sit to/from stand Pt Will Perform Upper Body Dressing: with set-up;with min assist;sitting Pt Will Perform Lower Body Dressing: with set-up;with max assist;sit to/from stand Pt Will Transfer to Toilet: with mod assist;bedside commode;stand pivot transfer Pt Will Perform Toileting - Clothing Manipulation and hygiene: with mod assist;sit to/from stand  Plan Discharge plan needs to be updated    Co-evaluation                 End of Session Equipment Utilized During Treatment: Gait belt   Activity Tolerance Patient tolerated treatment well   Patient Left in chair;with call bell/phone within reach   Nurse Communication Mobility status;Precautions        Time: 1610-96041404-1430 OT Time Calculation (min): 26 min  Charges: OT General Charges $OT Visit: 1 Procedure OT Treatments $Self Care/Home Management : 8-22 mins $Therapeutic Activity: 8-22  mins  Boone MasterJones, Tiron Suski B 04/26/2014, 4:16 PM Pager: [phone]

## 2014-04-26 NOTE — Progress Notes (Addendum)
Update: CSW spoke to patient's wife again over the phone regarding disposition plan. CSW went over the option of private paying at a SNF. Patient's wife states they are not able to private pay at a facility. CSW asked patient's wife if patient's wife considered applying for Medicaid. Patient's wife states she has not thought about it but will try and see if patient can get Medicaid. Patient's wife states she does not know when she would be able to fill out an application. CSW then explained that there is a facility in [place] that may be able to take patient with Medicaid Pending and wife stated no that was too far and she would be able to manage patient at home. CSW then explained the possibility of hiring extra help at home. Patient's wife appeared interested in this. CSW notified Case Manager and will page MD to make aware. CSW signing off at this time. Please re consult if social work needs arise.  CSW received call from [facility] stating that they ran the patient's insurance and patient does not have any SNF days left. CSW then called patient's wife to discuss her options. CSW explained that patient did not have any SNF days left and gave the option of paying out of pocket at SNF. Patient's wife states that she is not able to pay out of pocket and would just want patient to come home. Patient's wife stated she would stay with him and have Home Health come out to home. CSW will notify MD and CM of disposition change.   Maree Krabbe, MSW, Theresia Majors [phone]

## 2014-04-26 NOTE — Discharge Summary (Signed)
Physician Discharge Summary  Dakota Hall ZOX:[id number] DOB: January 29, 1951 DOA: 04/22/2014  PCP: Darnelle Bos, MD  Admit date: 04/22/2014 Discharge date: 04/27/2014  Time spent: 35 minutes.   Recommendations for Outpatient Follow-up:  1. Follow up with PCP in one week 2. Please check INR on Friday June 12 and on Monday June 15th by [facility] and fax the reports to PCP office.  3. If INR IS less than 2 please continue the lovenox injections along with coumadin till the INR is 2. If the INR is between 2-3, stop the lovenox injections and continue the coumadin.   Discharge Diagnoses:  Principal Problem:   Encephalopathy Active Problems:   HTN (hypertension)   DM (diabetes mellitus)   CVA (cerebral infarction)   Pulmonary emboli   AKI (acute kidney injury)   Bradycardia   Discharge Condition: improved.   Diet recommendation: carb modified, low sodium diet.   Filed Weights   04/22/14 2042  Weight: 88.9 kg (195 lb 15.8 oz)    History of present illness:   Dakota Hall is a 63 y.o. male with PMH significant for Stroke , Diabetes, history of massive PE S/P infusion Catheter TPA, on coumadin, OSA on CPAP who was notice to have worsening slurred speech this morning day of admission. Patient wife relates that she has notice slurred speech intermittent over last 2 weeks, worse this morning. Also she has notice difficulty finding words and he has been more sleepy. He has had decrease oral intake since the stroke but worse over last few days. He has been complaining of metal taste. He is lethargic but wake up to answer questions.he was admitted to [facility] service for evalution of CVA.   Hospital Course:   1-Slurred speech, difficulty finding words, lethargic and mild encephalopathy  -appears to be related to metabolic encephalopathy with worsening renal function and electrolytes disturbances . All his electrolytes are improving , his renal function is improving. HIS encephalopathy resolved.   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 Patient also had recent Echo and carotid dopplers (Aprin 2015)  -continue  supportive care  -continue PT/OT  On discharge at home.  -secondary prevention with coumadin and lovenox as bridge.  2-Encephalopathy  -as mentioned above.  -no signs of infection  -mentation is back to baseline  3-Acute Renal Failure; multifactorial, secondary to decrease poor oral intake, continue use of diuretics and other nephrotoxic drugs.  -will continue holding diuretics, diovan and metformin . Resume as outpatient as per PCP.  -no infection on UA; renal US w/o hydronephrosis   4-History of Massive PE; Patient underwent successful placement bilateral Pulmonary artery infusion caths on 4/28 and he received a 12 hour TPA infusion. He was then discharge on coumadin.  -Will cont coumadin. lovenox added as his INR is subtherapeutic.  -INR is subtherapeutic today   5-Bradycardia  -TSH WNL  -continue holding bisoprolol  -HR improved   6-Diabetes; continue SSI; A1C 6.9  7-OSA; CPAP.  8-HLD: will continue statins  9-deconditioning: PT recommending SNF; patient has just been released about 1 week ago to home for SNF. discussed with patient and family regarding discharge plans. Patient needs 24/7 supervision/assistance/care.  -patient's wife does not want him to go [facility] as it is too far for her. She wants to take him home  and will provide 24 hour care and surveillance with the help of her daughter.   Procedures: none Consultations:  neurology  Discharge Exam: Filed Vitals:   04/27/14 0947  BP:   Pulse:   Temp: 99.3 F (37.4 C)  Resp:     General: AAOX3, afebrile and feeling better; as per records dysarthria  and right hemiparesis (appears at baseline)  Cardiovascular: S1 and S2, no rubs or gallops  Respiratory: CTA, no wheezing or crackles  Abdomen: soft, NT, ND, positive BS  Musculoskeletal: right hemiparesis; no joint swelling   Discharge Instructions You were cared for by a hospitalist during your hospital stay. If you have any questions about your discharge medications or the care you received while you were in the hospital after you are discharged, you can call the unit and asked to speak with the hospitalist on call if the hospitalist that took care of you is not available. Once you are discharged, your primary care physician will handle any further medical issues. Please note that NO REFILLS for any discharge medications will be authorized once you are discharged, as it is imperative that you return to your primary care physician (or establish a relationship with a primary care physician if you do not have one) for your aftercare needs so that they can reassess your need for medications and monitor your lab values.      Discharge Instructions   Diet - low sodium heart healthy    Complete by:  As directed      Discharge instructions    Complete by:  As directed   Follow up with pcp and neurologist as recommended.     Discharge instructions    Complete by:  As directed   Please check INR on Friday June 12 th , Monday June 15 th. If INR is between 2 -3, please stop lovenox and resume coumadin, if INR is less than 2. Please continue lovenox injections along with coumadin to complete the 7 days. Please fax the INR reports to PCP office.  Follow up with PCP in one week.            Medication List    STOP taking these medications       bisoprolol 5 MG tablet  Commonly known as:  ZEBETA     metFORMIN 500 MG tablet  Commonly known as:  GLUCOPHAGE     NONFORMULARY OR COMPOUNDED ITEM     valsartan-hydrochlorothiazide 160-12.5 MG per tablet  Commonly known as:  DIOVAN-HCT      TAKE  these medications       amLODipine 10 MG tablet  Commonly known as:  NORVASC  Take 10 mg by mouth daily.     ARIPiprazole 5 MG tablet  Commonly known as:  ABILIFY  Take 2.5 mg by mouth daily.     aspirin EC 81 MG tablet  Take 1 tablet (81 mg total) by mouth daily.     baclofen 20 MG tablet  Commonly known as:  LIORESAL  Take 1 tablet (20 mg total) by mouth 3 (three) times daily.     docusate sodium 100 MG capsule  Commonly known as:  COLACE  Take 100 mg by mouth daily as needed for mild constipation.     enoxaparin 150 MG/ML injection  Commonly known as:  LOVENOX  Inject 0.87 mLs (130 mg total) into the skin daily.     feeding supplement (ENSURE COMPLETE) Liqd  Take 237 mLs by mouth 2 (two) times daily  before lunch and supper.     isosorbide-hydrALAZINE 20-37.5 MG per tablet  Commonly known as:  BIDIL  Take 1 tablet by mouth 2 (two) times daily.     pantoprazole 40 MG tablet  Commonly known as:  PROTONIX  Take 1 tablet (40 mg total) by mouth at bedtime.     pioglitazone 15 MG tablet  Commonly known as:  ACTOS  Take 15 mg by mouth daily.     polyethylene glycol packet  Commonly known as:  MIRALAX / GLYCOLAX  Take 17 g by mouth daily as needed for mild constipation.     sertraline 50 MG tablet  Commonly known as:  ZOLOFT  Take 5 tablets (250 mg total) by mouth at bedtime.     simvastatin 20 MG tablet  Commonly known as:  ZOCOR  Take 1 tablet (20 mg total) by mouth daily at 6 PM.     Vitamin D (Ergocalciferol) 50000 UNITS Caps capsule  Commonly known as:  DRISDOL  50,000 Units every 7 (seven) days.     warfarin 2.5 MG tablet  Commonly known as:  COUMADIN  Take 2.5 mg by mouth daily.       Allergies  Allergen Reactions  . Penicillins Hives   Follow-up Information   Follow up with [name],[name], MD. Schedule an appointment as soon as possible for a visit in 1 week.   Specialty:  Internal Medicine   Contact information:   [address],  [address] [place] [address] [phone]        The results of significant diagnostics from this hospitalization (including imaging, microbiology, ancillary and laboratory) are listed below for reference.    Significant Diagnostic Studies: Dg Chest 2 View  04/22/2014   CLINICAL DATA:  Encephalopathy  EXAM: CHEST  2 VIEW  COMPARISON:  Chest radiograph March 14, 2014 and chest CT Mar 17, 2014  FINDINGS: There is no edema or consolidation. Heart is upper normal in size with normal pulmonary vascularity. No adenopathy. There is collapsed on vertebral body, a stable finding.  IMPRESSION: No edema or consolidation. Stable collapse of the L1 vertebral body.   Electronically Signed   By: Bretta BangWilliam  Woodruff M.D.   On: 04/22/2014 20:05   Ct Head Wo Contrast  04/22/2014   CLINICAL DATA:  Altered mental status and facial droop  EXAM: CT HEAD WITHOUT CONTRAST  TECHNIQUE: Contiguous axial images were obtained from the base of the skull through the vertex without intravenous contrast. Study was obtained within 24 hr of patient's arrival at the emergency department.  COMPARISON:  March 14, 2014  FINDINGS: Mild generalized atrophy is stable. There is no demonstrable mass, hemorrhage, extra-axial fluid collection, or midline shift. There is small vessel disease throughout the centra semiovale bilaterally, stable. There is also small vessel disease in the anterior left pons, stable. There is a subacute infarct in the posterior left lentiform nucleus region, showing evidence of evolution compared to the most recent prior study. No new/acute appearing infarct is seen on the current examination. Bony calvarium appears intact. The mastoid air cells are clear.  IMPRESSION: Evidence of further evolution of a subacute infarct in the posterior left lentiform nucleus. There is atrophy with extensive small vessel disease in the anterior left pons and supratentorial white matter bilaterally. No acute infarct apparent. No hemorrhage  or mass effect. 1   Electronically Signed   By: Bretta BangWilliam  Woodruff M.D.   On: 04/22/2014 14:41   Mr Brain Wo Contrast  04/23/2014  CLINICAL DATA:  Stroke. Episode of slurred speech. Recent infarct. The symptoms have since resolved.  EXAM: MRI HEAD WITHOUT CONTRAST  MRA HEAD WITHOUT CONTRAST  TECHNIQUE: Multiplanar, multiecho pulse sequences of the brain and surrounding structures were obtained without intravenous contrast. Angiographic images of the head were obtained using MRA technique without contrast.  COMPARISON:  CT head 04/22/2014.  MRI brain 02/04/2014.  FINDINGS: MRI HEAD FINDINGS  The previous left basal ganglia and corona radiata radiata is again noted. There is some susceptibility along the infarct tract compatible with microhemorrhage, new since the prior exam.  No new areas of infarct are present. Extensive periventricular and subcortical white matter change is present bilaterally. Mild generalized atrophy is noted. White matter changes into the brainstem are stable. No acute hemorrhage or mass lesion is evident.  Flow is present in the major intracranial arteries. The globes and orbits are intact. The paranasal sinuses and mastoid air cells are clear.  MRA HEAD FINDINGS  The internal carotid arteries are within normal limits from the high cervical segments through the ICA termini bilaterally. The A1 and M1 segments are normal. The anterior communicating artery is patent. The MCA bifurcations are intact. Medium and small vessel irregularity in the ACA and MCA branch vessels is similar to the prior exam.  The left vertebral artery is normal scratch the the left vertebral artery is dominant. There is mild narrowing of the non dominant right vertebral artery at the dural margin without change. The basilar artery is intact. Both posterior cerebral arteries originate from the basilar tip. Narrowing of the proximal posterior cerebral artery is more prominent on the left, without change.  IMPRESSION: 1.  Evolution of the left basal ganglia and corona radiata infarct with interval development of petechial hemorrhage. There is no mass effect or surrounding edema. 2. No new infarct or acute hemorrhage. 3. Stable atrophy and diffuse white matter disease. This likely reflects the sequela of chronic microvascular ischemia. 4. Stable atherosclerotic changes without evidence for significant proximal stenosis, aneurysm, or branch vessel occlusion within the circle of Willis.   Electronically Signed   By: Gennette Pac M.D.   On: 04/23/2014 13:12   US Renal  04/23/2014   CLINICAL DATA:  Renal failure.  EXAM: RENAL/URINARY TRACT ULTRASOUND COMPLETE  COMPARISON:  12/09/2006  FINDINGS: Right Kidney:  Length: 11.7 cm. Echogenicity within normal limits. No mass or hydronephrosis visualized.  Left Kidney:  Length: 11.5 cm. Echogenicity within normal limits. No mass or hydronephrosis visualized.  Bladder:  Appears normal for degree of bladder distention.  IMPRESSION: Normal renal ultrasound.   Electronically Signed   By: Elberta Fortis M.D.   On: 04/23/2014 11:44   Mr Maxine Glenn Head/brain Wo Cm  04/23/2014   CLINICAL DATA:  Stroke. Episode of slurred speech. Recent infarct. The symptoms have since resolved.  EXAM: MRI HEAD WITHOUT CONTRAST  MRA HEAD WITHOUT CONTRAST  TECHNIQUE: Multiplanar, multiecho pulse sequences of the brain and surrounding structures were obtained without intravenous contrast. Angiographic images of the head were obtained using MRA technique without contrast.  COMPARISON:  CT head 04/22/2014.  MRI brain 02/04/2014.  FINDINGS: MRI HEAD FINDINGS  The previous left basal ganglia and corona radiata radiata is again noted. There is some susceptibility along the infarct tract compatible with microhemorrhage, new since the prior exam.  No new areas of infarct are present. Extensive periventricular and subcortical white matter change is present bilaterally. Mild generalized atrophy is noted. White matter changes into the  brainstem are stable. No  acute hemorrhage or mass lesion is evident.  Flow is present in the major intracranial arteries. The globes and orbits are intact. The paranasal sinuses and mastoid air cells are clear.  MRA HEAD FINDINGS  The internal carotid arteries are within normal limits from the high cervical segments through the ICA termini bilaterally. The A1 and M1 segments are normal. The anterior communicating artery is patent. The MCA bifurcations are intact. Medium and small vessel irregularity in the ACA and MCA branch vessels is similar to the prior exam.  The left vertebral artery is normal scratch the the left vertebral artery is dominant. There is mild narrowing of the non dominant right vertebral artery at the dural margin without change. The basilar artery is intact. Both posterior cerebral arteries originate from the basilar tip. Narrowing of the proximal posterior cerebral artery is more prominent on the left, without change.  IMPRESSION: 1. Evolution of the left basal ganglia and corona radiata infarct with interval development of petechial hemorrhage. There is no mass effect or surrounding edema. 2. No new infarct or acute hemorrhage. 3. Stable atrophy and diffuse white matter disease. This likely reflects the sequela of chronic microvascular ischemia. 4. Stable atherosclerotic changes without evidence for significant proximal stenosis, aneurysm, or branch vessel occlusion within the circle of Willis.   Electronically Signed   By: Gennette Pac M.D.   On: 04/23/2014 13:12    Microbiology: Recent Results (from the past 240 hour(s))  URINE CULTURE     Status: None   Collection Time    04/22/14  1:40 PM      Result Value Ref Range Status   Specimen Description URINE, CLEAN CATCH   Final   Special Requests ADD [id number] [phone]   Final   Culture  Setup Time     Final   Value: 04/23/2014 02:42     Performed at [facility]   Colony Count     Final   Value: 2,000 COLONIES/ML      Performed at [facility]   Culture     Final   Value: INSIGNIFICANT GROWTH     Performed at [facility]   Report Status 04/24/2014 FINAL   Final  MRSA PCR SCREENING     Status: None   Collection Time    04/23/14  6:13 AM      Result Value Ref Range Status   MRSA by PCR NEGATIVE  NEGATIVE Final   Comment:            The GeneXpert MRSA Assay (FDA     approved for NASAL specimens     only), is one component of a     comprehensive MRSA colonization     surveillance program. It is not     intended to diagnose MRSA     infection nor to guide or     monitor treatment for     MRSA infections.     Labs: Basic Metabolic Panel:  Recent Labs Lab 04/22/14 1405 04/22/14 1436 04/22/14 1921 04/24/14 0435 04/25/14 0350 04/26/14 0344 04/27/14 1035  NA 141 145  --  144 142 145 142  K 3.5* 3.4*  --  3.5* 3.2* 3.2* 4.0  CL 99 99  --  104 101 103 100  CO2 27  --   --  24 25 25 25   GLUCOSE 128* 127*  --  110* 119* 120* 127*  BUN 59* 52*  --  47* 34* 24* 15  CREATININE 3.44* 3.70*  --  1.87* 1.66* 1.42* 1.22  CALCIUM 10.2  --   --  9.4 9.4 9.2 9.6  MG  --   --  2.1  --   --   --   --    Liver Function Tests:  Recent Labs Lab 04/22/14 1405  AST 18  ALT 15  ALKPHOS 70  BILITOT 0.4  PROT 7.6  ALBUMIN 3.8   No results found for this basename: LIPASE, AMYLASE,  in the last 168 hours No results found for this basename: AMMONIA,  in the last 168 hours CBC:  Recent Labs Lab 04/22/14 1405 04/22/14 1436 04/24/14 0435 04/26/14 0344  WBC 9.0  --  8.1 8.0  NEUTROABS 5.4  --   --   --   HGB 14.2 15.3 12.3* 11.8*  HCT 41.8 45.0 36.5* 35.1*  MCV 86.9  --  87.5 88.2  PLT 174  --  154 147*   Cardiac Enzymes: No results found for this basename: CKTOTAL, CKMB, CKMBINDEX, TROPONINI,  in the last 168 hours BNP: BNP (last 3 results)  Recent Labs  03/14/14 1130  PROBNP 5474.0*   CBG:  Recent Labs Lab 04/26/14 1101 04/26/14 1604 04/26/14 2104 04/27/14 0641  04/27/14 1124  GLUCAP 159* 145* 87 101* 113*       Signed:  Hafsah Hendler  [facility] 04/27/2014, 12:59 PM

## 2014-04-26 NOTE — Discharge Instructions (Signed)
Information on my medicine - Coumadin   (Warfarin)  This medication education was reviewed with me or my healthcare representative as part of my discharge preparation.  The pharmacist that spoke with me during my hospital stay was:  Lucia Gaskins, [facility]  Why was Coumadin prescribed for you? Coumadin was prescribed for you because you have a blood clot or a medical condition that can cause an increased risk of forming blood clots. Blood clots can cause serious health problems by blocking the flow of blood to the heart, lung, or brain. Coumadin can prevent harmful blood clots from forming. As a reminder your indication for Coumadin is:   Pulmonary Embolism Treatment  What test will check on my response to Coumadin? While on Coumadin (warfarin) you will need to have an INR test regularly to ensure that your dose is keeping you in the desired range. The INR (international normalized ratio) number is calculated from the result of the laboratory test called prothrombin time (PT).  If an INR APPOINTMENT HAS NOT ALREADY BEEN MADE FOR YOU please schedule an appointment to have this lab work done by your health care provider within 7 days. Your INR goal is usually a number between:  2 to 3 or your provider may give you a more narrow range like 2-2.5.  Ask your health care provider during an office visit what your goal INR is.  What  do you need to  know  About  COUMADIN? Take Coumadin (warfarin) exactly as prescribed by your healthcare provider about the same time each day.  DO NOT stop taking without talking to the doctor who prescribed the medication.  Stopping without other blood clot prevention medication to take the place of Coumadin may increase your risk of developing a new clot or stroke.  Get refills before you run out.  What do you do if you miss a dose? If you miss a dose, take it as soon as you remember on the same day then continue your regularly scheduled regimen the next day.  Do not take two  doses of Coumadin at the same time.  Important Safety Information A possible side effect of Coumadin (Warfarin) is an increased risk of bleeding. You should call your healthcare provider right away if you experience any of the following:   Bleeding from an injury or your nose that does not stop.   Unusual colored urine (red or dark [place]) or unusual colored stools (red or black).   Unusual bruising for unknown reasons.   A serious fall or if you hit your head (even if there is no bleeding).  Some foods or medicines interact with Coumadin (warfarin) and might alter your response to warfarin. To help avoid this:   Eat a balanced diet, maintaining a consistent amount of Vitamin K.   Notify your provider about major diet changes you plan to make.   Avoid alcohol or limit your intake to 1 drink for women and 2 drinks for men per day. (1 drink is 5 oz. wine, 12 oz. beer, or 1.5 oz. liquor.)  Make sure that ANY health care provider who prescribes medication for you knows that you are taking Coumadin (warfarin).  Also make sure the healthcare provider who is monitoring your Coumadin knows when you have started a new medication including herbals and non-prescription products.  Coumadin (Warfarin)  Major Drug Interactions  Increased Warfarin Effect Decreased Warfarin Effect  Alcohol (large quantities) Antibiotics (esp. Septra/Bactrim, Flagyl, Cipro) Amiodarone (Cordarone) Aspirin (ASA) Cimetidine (Tagamet)  Megestrol (Megace) °NSAIDs (ibuprofen, naproxen, etc.) °Piroxicam (Feldene) °Propafenone (Rythmol SR) °Propranolol (Inderal) °Isoniazid (INH) °Posaconazole (Noxafil) Barbiturates (Phenobarbital) °Carbamazepine (Tegretol) °Chlordiazepoxide (Librium) °Cholestyramine (Questran) °Griseofulvin °Oral Contraceptives °Rifampin °Sucralfate (Carafate) °Vitamin K  ° °Coumadin® (Warfarin) Major Herbal Interactions  °Increased Warfarin Effect Decreased Warfarin Effect  °Garlic °Ginseng °Ginkgo biloba Coenzyme  Q10 °Green tea °St. John’s wort   ° °Coumadin® (Warfarin) FOOD Interactions  °Eat a consistent number of servings per week of foods HIGH in Vitamin K °(1 serving = ½ cup)  °Collards (cooked, or boiled & drained) °Kale (cooked, or boiled & drained) °Mustard greens (cooked, or boiled & drained) °Parsley *serving size only = ¼ cup °Spinach (cooked, or boiled & drained) °Swiss chard (cooked, or boiled & drained) °Turnip greens (cooked, or boiled & drained)  °Eat a consistent number of servings per week of foods MEDIUM-HIGH in Vitamin K °(1 serving = 1 cup)  °Asparagus (cooked, or boiled & drained) °Broccoli (cooked, boiled & drained, or raw & chopped) °Brussel sprouts (cooked, or boiled & drained) *serving size only = ½ cup °Lettuce, raw (green leaf, endive, romaine) °Spinach, raw °Turnip greens, raw & chopped  ° °These websites have more information on Coumadin (warfarin):  www.coumadin.com; °www.ahrq.gov/consumer/coumadin.htm; ° ° °

## 2014-04-26 NOTE — Progress Notes (Signed)
Speech Language Pathology Treatment: Cognitive-Linquistic  Patient Details Name: Dakota Hall MRN: [id number] DOB: 1951/08/12 Today's Date: 04/26/2014 Time: 3976-7341 SLP Time Calculation (min): 19 min  Assessment / Plan / Recommendation Clinical Impression  Pt recalled his speech intelligibility strategies with Min verbal cues, and utilized them during unstructured conversation with verbal reminder x1. Pt required Min question cues to problem solving during reading task to put his glasses on to improve visual acuity. Pt read headlines (phrases) aloud to unfamiliar listener. When asked to repeat, he utilized appropriate strategies with Mod I to increase his intelligibility.   HPI HPI: 63 year old male admitted 04/22/14 due to increased slurred speech, decreased word finding.  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 Pt was DC'd from CIR 03/03/14.  MRI revealed no new infarct, but evolution of left basal ganglia and corona radiata infarct.    Pertinent Vitals N/A  SLP Plan  Continue with current plan of care    Recommendations                Oral Care Recommendations: Oral care BID Follow up Recommendations: Home health SLP;24 hour supervision/assistance Plan: Continue with current plan of care    GO      Maxcine Ham, M.A. CCC-SLP [phone]  Maxcine Ham 04/26/2014, 12:32 PM

## 2014-04-27 LAB — GLUCOSE, CAPILLARY
Glucose-Capillary: 101 mg/dL — ABNORMAL HIGH (ref 70–99)
Glucose-Capillary: 113 mg/dL — ABNORMAL HIGH (ref 70–99)

## 2014-04-27 LAB — BASIC METABOLIC PANEL
BUN: 15 mg/dL (ref 6–23)
CO2: 25 meq/L (ref 19–32)
CREATININE: 1.22 mg/dL (ref 0.50–1.35)
Calcium: 9.6 mg/dL (ref 8.4–10.5)
Chloride: 100 mEq/L (ref 96–112)
GFR calc Af Amer: 72 mL/min — ABNORMAL LOW (ref >90)
GFR, EST NON AFRICAN AMERICAN: 62 mL/min — AB (ref >90)
GLUCOSE: 127 mg/dL — AB (ref 70–99)
Potassium: 4 mEq/L (ref 3.7–5.3)
SODIUM: 142 meq/L (ref 137–147)

## 2014-04-27 LAB — PROTIME-INR
INR: 1.29 (ref 0.00–1.49)
Prothrombin Time: 15.8 seconds — ABNORMAL HIGH (ref 11.6–15.2)

## 2014-04-27 MED ORDER — ENOXAPARIN SODIUM 150 MG/ML ~~LOC~~ SOLN: 130.0000 mg | SUBCUTANEOUS | Status: DC

## 2014-04-27 MED ORDER — WARFARIN SODIUM 3 MG PO TABS
3.0000 mg | ORAL_TABLET | Freq: Once | ORAL | Status: DC
  Filled 2014-04-27: qty 1

## 2014-04-27 MED ORDER — ENOXAPARIN SODIUM 150 MG/ML ~~LOC~~ SOLN
130.0000 mg | SUBCUTANEOUS | Status: DC
  Administered 2014-04-27: 130 mg via SUBCUTANEOUS
  Filled 2014-04-27: qty 1

## 2014-04-27 NOTE — Progress Notes (Signed)
ANTICOAGULATION CONSULT NOTE - Follow Up Consult  Pharmacy Consult for coumadin and lovenox Indication: hx PE  Allergies  Allergen Reactions  . Penicillins Hives    Patient Measurements: Height: 5\' 8"  (172.7 cm) Weight: 195 lb 15.8 oz (88.9 kg) IBW/kg (Calculated) : 68.4   Vital Signs: Temp: 99.3 F (37.4 C) (06/11 0947) Temp src: Oral (06/11 0734) BP: 153/53 mmHg (06/11 0734) Pulse Rate: 83 (06/11 0734)  Labs:  Recent Labs  04/25/14 0350 04/26/14 0344 04/27/14 0347 04/27/14 1035  HGB  --  11.8*  --   --   HCT  --  35.1*  --   --   PLT  --  147*  --   --   LABPROT 16.9* 15.9* 15.8*  --   INR 1.41 1.30 1.29  --   CREATININE 1.66* 1.42*  --  1.22    Estimated Creatinine Clearance: 68 ml/min (by C-G formula based on Cr of 1.22).  Assessment: Patient is a 63 y.o M on coumadin for hx PE.  INR continues to trend down today with 1.29.  Will start lovenox full dose for bridging while INR is below goal.  No bleeding documented.  Scr down 1.22 (est crcl~ 68).   Goal of Therapy:  INR 2-3 Monitor platelets by anticoagulation protocol: Yes   Plan:  1) coumadin 3mg  PO x1 today (increased dose slightly from home dose secondary to supratherapeutic INR level on admission) 2) lovenox 130mg  SQ q24h (1.5mg /kg/day)  Arlester Marker, Glenda Spelman P 04/27/2014,12:23 PM

## 2014-04-27 NOTE — Progress Notes (Signed)
Physical Therapy Treatment Patient Details Name: Dakota Hall MRN: [id number] DOB: [birth year] Today's Date: 04/27/2014    History of Present Illness HPI: Dakota Hall is a 63 y.o. male with PMH significant for Stroke , Diabetes, history of massive PE S/P infusion Catheter TPA, on coumadin, OSA on CPAP who was notice to have worsening slurred speech this morning day of admission. Patient wife relates that she has notice slurred speech intermittent over last 2 weeks, worse this morning. 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 Easier for pt to stand when he can grasp secure object in lt hand.  Follow Up Recommendations  Home health PT (Pt out of SNF days)     Equipment Recommendations   (hospital bed with trapeze)    Recommendations for Other Services       Precautions / Restrictions Precautions Precautions: Fall Required Braces or Orthoses: Other Brace/Splint Other Brace/Splint: has Rt hand splint    Mobility  Bed Mobility Overal bed mobility: Needs Assistance Bed Mobility: Sidelying to Sit;Sit to Sidelying;Rolling Rolling: Supervision Sidelying to sit: Mod assist   Sit to supine: Mod assist   General bed mobility comments: Assist to get lt leg off of bed and bring trunk up.  Transfers Overall transfer level: Needs assistance Equipment used:  (back of high back chair) Transfers: Sit to/from Stand Sit to Stand: Min assist         General transfer comment: Had pt grab back of chair with left arm to assist pulling up into standing. Assist to place rt foot and to monitor rt knee.  Ambulation/Gait Ambulation/Gait assistance: Mod assist Ambulation Distance (Feet): 2 Feet Assistive device:  (pt holding back of chair) Gait Pattern/deviations: Step-to pattern     General Gait Details: Side-stepped up side of bed with assist to  advance rt leg laterally and blocking of rt knee.   Stairs            Wheelchair Mobility    Modified Rankin (Stroke Patients Only)       Balance   Sitting-balance support: No upper extremity supported;Feet supported Sitting balance-Leahy Scale: Good     Standing balance support: Single extremity supported Standing balance-Leahy Scale: Poor Standing balance comment: Pt able to stand holding on to back of chair with left hand and min A. Pt stood x 3 for ~ 3 minutes each time.                    Cognition Arousal/Alertness: Awake/alert Behavior During Therapy: WFL for tasks assessed/performed Overall Cognitive Status: History of cognitive impairments - at baseline                      Exercises      General Comments        Pertinent Vitals/Pain No c/o's    Home Living                      Prior Function            PT Goals (current goals can now be found in the care plan section) Progress towards PT goals: Progressing toward goals    Frequency  Min 3X/week    PT Plan Discharge plan needs to be updated    Co-evaluation  End of Session Equipment Utilized During Treatment: Gait belt Activity Tolerance: Patient tolerated treatment well Patient left: in bed;with call bell/phone within reach;with bed alarm set     Time: [id number] PT Time Calculation (min): 17 min  Charges:  $Gait Training: 8-22 mins                    G Codes:      Damon Hargrove 04/27/2014, 12:56 PM  [employer] Warrick Llera PT [phone]

## 2014-04-27 NOTE — Progress Notes (Signed)
Occupational Therapy Treatment Patient Details Name: Dakota AnoRoger W Skilton MRN: [id number] DOB: 02/15/1951 Today's Date: 04/27/2014    History of present illness HPI: Dakota Hall is a 63 y.o. male with PMH significant for Stroke , Diabetes, history of massive PE S/P infusion Catheter TPA, on coumadin, OSA on CPAP who was notice to have worsening slurred speech this morning day of admission. Patient wife relates that she has notice slurred speech intermittent over last 2 weeks, worse this morning. 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 Pt however with uncontrolled edema due to RT UE in dependent position on arrival. Pt again educated on edema management. Pt repositioned with PROM provided to RT UE this session. Schedule posted for splint wear .   Follow Up Recommendations  Home health OT;Other (comment) (SNF recommended however no insurance coverage)    Equipment Recommendations  Other (comment) (hoyer, Rt arm trough for w/c, hospital bed with trapeze)    Recommendations for Other Services      Precautions / Restrictions Precautions Precautions: Fall Required Braces or Orthoses: Other Brace/Splint Other Brace/Splint: has Rt hand splint       Mobility Bed Mobility Overal bed mobility: Needs Assistance Bed Mobility: Sidelying to Sit;Sit to Sidelying;Rolling Rolling: Supervision Sidelying to sit: Mod assist   Sit to supine: Mod assist   General bed mobility comments: Assist to get lt leg off of bed and bring trunk up.  Transfers Overall transfer level: Needs assistance Equipment used:  (back of high back chair) Transfers: Sit to/from Stand Sit to Stand: Min assist         General transfer comment: Had pt grab back of chair with left arm to assist pulling up into standing. Assist to  place rt foot and to monitor rt knee.    Balance   Sitting-balance support: No upper extremity supported;Feet supported Sitting balance-Leahy Scale: Good     Standing balance support: Single extremity supported Standing balance-Leahy Scale: Poor Standing balance comment: Pt able to stand holding on to back of chair with left hand and min A. Pt stood x 3 for ~ 3 minutes each time.                   ADL                                         General ADL Comments: Pt supine in bed with Rt UE in dependent position. OT elevated RT UE on x3 pillows . pt provided retrograde massage. Pt provided PROM to digits ( dip, pip, mcp, thumb circumduction) wrist extension, supination/ pronation. Pt very anxious with all attempts at PROM. Pt with decr redness at the 1st and 5th digit. Schedule modified and sign hung on wall . Pt now 4 hours on and 2 hours off. OT removed cover to splint and cleaned fabric. Splint sat in window seal with sign to allow splint to completely dry prior to don to patient. pt ending session with Rt ue on x3 pillows hand over wrist and x2 rolled wash clothes in palm. Pt in good alignment positioning to decr edema. pt will benefit from modifications in splint  schedule as edema is more managed.      Vision                     Perception     Praxis      Cognition   Behavior During Therapy: WFL for tasks assessed/performed Overall Cognitive Status: History of cognitive impairments - at baseline                       Extremity/Trunk Assessment               Exercises     Shoulder Instructions       General Comments      Pertinent Vitals/ Pain       VSS  Home Living                                          Prior Functioning/Environment              Frequency Min 2X/week     Progress Toward Goals  OT Goals(current goals can now be found in the care plan section)  Progress towards OT goals:  Progressing toward goals  Acute Rehab OT Goals Patient Stated Goal: to get better and walking again Time For Goal Achievement: 05/08/14 Potential to Achieve Goals: Good ADL Goals Pt Will Perform Grooming: with set-up;sitting;with min guard assist Pt Will Perform Upper Body Bathing: with set-up;with min assist;sitting Pt Will Perform Lower Body Bathing: with mod assist;sitting/lateral leans;sit to/from stand Pt Will Perform Upper Body Dressing: with set-up;with min assist;sitting Pt Will Perform Lower Body Dressing: with set-up;with max assist;sit to/from stand Pt Will Transfer to Toilet: with mod assist;bedside commode;stand pivot transfer Pt Will Perform Toileting - Clothing Manipulation and hygiene: with mod assist;sit to/from stand  Plan Discharge plan remains appropriate    Co-evaluation                 End of Session     Activity Tolerance Patient tolerated treatment well   Patient Left in bed;with call bell/phone within reach   Nurse Communication Mobility status;Precautions        Time: 3903-0092 OT Time Calculation (min): 28 min  Charges: OT General Charges $OT Visit: 1 Procedure OT Treatments $Therapeutic Activity: 23-37 mins  Boone Master B 04/27/2014, 1:07 PM Pager: [phone]

## 2014-05-01 ENCOUNTER — Encounter: Payer: Managed Care, Other (non HMO) | Admitting: Oncology

## 2014-05-03 ENCOUNTER — Ambulatory Visit (INDEPENDENT_AMBULATORY_CARE_PROVIDER_SITE_OTHER): Payer: Managed Care, Other (non HMO) | Admitting: Internal Medicine

## 2014-05-03 ENCOUNTER — Encounter: Payer: Self-pay | Admitting: Internal Medicine

## 2014-05-03 VITALS — BP 110/72 | HR 91 | Ht 68.0 in

## 2014-05-03 DIAGNOSIS — I2699 Other pulmonary embolism without acute cor pulmonale: Secondary | ICD-10-CM

## 2014-05-03 DIAGNOSIS — Z86711 Personal history of pulmonary embolism: Secondary | ICD-10-CM | POA: Insufficient documentation

## 2014-05-03 NOTE — Patient Instructions (Signed)
Continue coumadin through May 2015; 1 year and we can reasess Be careful not to fall Monitoring through coumadin clinic Talk to neuro about metallic taste in mouth  Followup  6 months or sooner if needed

## 2014-05-03 NOTE — Progress Notes (Signed)
Subjective:    Patient ID: Dakota Hall, male    DOB: 09/21/1951, 63 y.o.   MRN: [id number]  HPI  63 year old male, residing at S/p recent CVA w/ right sided motor deficits. Began to c/o CP and worsening dyspnea first noted on 4/27 during PT. O2 sats recorded in 70% range on room air. Sent to ER 4/28 for evaluation. Dx eval showed massive saddle embolus w/ evidence or right heart strain. PCCM asked to admit.  SIGNIFICANT EVENTS / STUDIES:  CT chest 4/28: Massive bilateral pulmonary emboli with saddle embolus and evidence of right heart strain/failure.  ECHO 4/28 >>> EF 55-60%, RV hemodynamic compromise from probable large saddle pulmonary embolus. There is thrombus in transit in the RA.  LE Doppler US 4/28 >>> acute DVT throughout popliteal vein; age indeterminate DVT throughout posterior tibial vein; acute SVT involving proximal segment of lesser saphenous vein.  CT chest 05/1>>>Significantly decreased pulmonary embolic burden with significant  reduction in right heart strain and near complete normalization of the RV/LV ratio  Acute hypoxic respiratory failure in setting of massive B/L pulmonary emboli following recent hospitalization/rehab for acute CVA. [place] weaned To RA PAP is much improved per IR.  -Catheter directed tPA completed 04/29  -Heparin IV bridge  -Coumadin started 5/3 per pharmacy  Elevated CEs in setting of PE decreased clot burden and RV strain nearly resolved by CTA 5/1.  HTN   OV 05/03/2014  Chief Complaint  Patient presents with  . Follow-up    [facility] HFU per Cindee LamePete. Pt denies Dyspnea, CP and cough.    FU for above. After long stay went home end May 2015 per wife. Then admitted again for dehydration through 04/26/14. INR went low this stay and dc on lovenox but since then has > 2 per hx and now off lovenox. Denies dyspnea, edema,orthopnea, hemoptysis, chest pain, syncope. Disabled from stroke with right hemiparesis; total assist. So far no falls.  Wife and home OT, PT, RN suppoort +. They take extreme care not to fall. No issue  Their main question is regarding PE duration of Rx  Review of Systems  Constitutional: Negative for fever and unexpected weight change.  HENT: Negative for congestion, dental problem, ear pain, nosebleeds, postnasal drip, rhinorrhea, sinus pressure, sneezing, sore throat and trouble swallowing.   Eyes: Negative for redness and itching.  Respiratory: Negative for cough, chest tightness, shortness of breath and wheezing.   Cardiovascular: Negative for palpitations and leg swelling.  Gastrointestinal: Negative for nausea and vomiting.  Genitourinary: Negative for dysuria.  Musculoskeletal: Negative for joint swelling.  Skin: Negative for rash.  Neurological: Negative for headaches.  Hematological: Does not bruise/bleed easily.  Psychiatric/Behavioral: Negative for dysphoric mood. The patient is not nervous/anxious.        Objective:   Physical Exam  Nursing note and vitals reviewed. Constitutional: He is oriented to person, place, and time. He appears well-developed and well-nourished. No distress.  HENT:  Head: Normocephalic and atraumatic.  Right Ear: External ear normal.  Left Ear: External ear normal.  Mouth/Throat: Oropharynx is clear and moist. No oropharyngeal exudate.  Eyes: Conjunctivae and EOM are normal. Pupils are equal, round, and reactive to light. Right eye exhibits no discharge. Left eye exhibits no discharge. No scleral icterus.  Neck: Normal range of motion. Neck supple. No JVD present. No tracheal deviation present. No thyromegaly present.  Cardiovascular: Normal rate, regular rhythm and intact distal pulses.  Exam reveals no gallop and  no friction rub.   No murmur heard. Pulmonary/Chest: Effort normal and breath sounds normal. No respiratory distress. He has no wheezes. He has no rales. He exhibits no tenderness.  Abdominal: Soft. Bowel sounds are normal. He exhibits no distension and  no mass. There is no tenderness. There is no rebound and no guarding.  Musculoskeletal: Normal range of motion. He exhibits no edema and no tenderness.  Lymphadenopathy:    He has no cervical adenopathy.  Neurological: He is alert and oriented to person, place, and time. He has normal reflexes. No cranial nerve deficit. Coordination normal.  Sitting in wheel chair Slurred speech RUE in sling Rt side weak Capacity +  Skin: Skin is warm and dry. No rash noted. He is not diaphoretic. No erythema. No pallor.  Psychiatric: He has a normal mood and affect. His behavior is normal. Judgment and thought content normal.     Filed Vitals:   05/03/14 1402  BP: 110/72  Pulse: 91  Height: 5\' 8"  (1.727 m)  SpO2: 97%        Assessment & Plan:  Continue coumadin through May 2015; 1 year and we can reasess Be careful not to fall Monitoring through coumadin clinic Talk to neuro about metallic taste in mouth  Followup  6 months or sooner if needed

## 2014-05-07 NOTE — Assessment & Plan Note (Signed)
Discussed duration of PE Rx. With stroke he is at risk for fall but they take extreme care not to fall. Given stroke and immobility he is also at increased risk for PE recurrence if Rx prematurelly discontinued  Therefore, we opted for > 6 month Rx plan. They understand risk v benefit  PLAN  Continue coumadin through May 2015; 1 year and we can reasess Be careful not to fall Monitoring through coumadin clinic Talk to neuro about metallic taste in mouth  Followup  6 months or sooner if needed

## 2014-07-27 ENCOUNTER — Ambulatory Visit: Payer: Managed Care, Other (non HMO) | Admitting: Nurse Practitioner

## 2014-08-01 ENCOUNTER — Telehealth: Payer: Self-pay | Admitting: Nurse Practitioner

## 2014-08-01 ENCOUNTER — Encounter: Payer: Self-pay | Admitting: Nurse Practitioner

## 2014-08-01 ENCOUNTER — Ambulatory Visit (INDEPENDENT_AMBULATORY_CARE_PROVIDER_SITE_OTHER): Payer: Managed Care, Other (non HMO) | Admitting: Nurse Practitioner

## 2014-08-01 VITALS — BP 158/81 | HR 56 | Ht 68.0 in | Wt 204.4 lb

## 2014-08-01 DIAGNOSIS — I6350 Cerebral infarction due to unspecified occlusion or stenosis of unspecified cerebral artery: Secondary | ICD-10-CM

## 2014-08-01 DIAGNOSIS — I639 Cerebral infarction, unspecified: Secondary | ICD-10-CM

## 2014-08-01 DIAGNOSIS — G8110 Spastic hemiplegia affecting unspecified side: Secondary | ICD-10-CM

## 2014-08-01 DIAGNOSIS — I69322 Dysarthria following cerebral infarction: Secondary | ICD-10-CM

## 2014-08-01 DIAGNOSIS — I69922 Dysarthria following unspecified cerebrovascular disease: Secondary | ICD-10-CM

## 2014-08-01 DIAGNOSIS — G8111 Spastic hemiplegia affecting right dominant side: Secondary | ICD-10-CM

## 2014-08-01 NOTE — Progress Notes (Signed)
PATIENT: Dakota Hall DOB: 1951-10-24  REASON FOR VISIT: routine follow up for stroke HISTORY FROM: patient, wife  HISTORY OF PRESENT ILLNESS: [name], 63 year old male who comes to the office for first hospital follow up post hospital discharge for stroke. 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 He has vascular risk factors of hypertension, hyperlipidemia, obstructive sleep apnea, diabetes and obesity.   In March, patient was hospitalized for sudden onset of right hemiparesis on 02/03/14. 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 He was given IV tPa. MRI showed a 2 x 2.5 cm region of acute infarction affecting the left basal ganglia and radiating white matter tracts. MRA was negative. 2D Echocardiogram EF 55-60% with no source of embolus. Carotid Doppler showed no evidence of hemodynamically significant internal carotid artery stenosis.  Hbg A1c was 8.2, LDL 113. He was discharged to CIR.  He was then admitted on 03/14/14 with shortness of breath, sats 74% on RA and found to have acute massive PE. He underwent catheter directed Pulmonary emboli lysis with TPA infusion with good results. Anticoagulation was started and he was discharged back to rehab.  He was noticed to have worsening slurred speech the morning of 04/22/14. He was taken to [facility]. Patient wife relates that she had noticed slurred speech intermittent over last 2 weeks prior, worse the morning of admission. Also she has notice difficulty finding words and he had been more sleepy. He had been complaining of metal taste. He was admitted to [facility] service for evalution of CVA. MRI and CT head negative for acute intracranial abnormalities. Appeared to be related to metabolic encephalopathy with worsening renal function and electrolytes disturbances. His encephalopathy resolved during the course of hospitalization.   Since that time he has returned home and is continuing to work on  regaining strength. His INR has been stable and his blood pressure has been under fair control, it is 158/81 in the office today.  He states that he has been compliant with his cpap and trying to do what is needed to get well.   REVIEW OF SYSTEMS: Full 14 system review of systems performed and notable only for: apnea, daytime sleepiness, walking difficulty, depression.  ALLERGIES: Allergies  Allergen Reactions  . Penicillins Hives    HOME MEDICATIONS: Outpatient Prescriptions Prior to Visit  Medication Sig Dispense Refill  . acetaminophen (TYLENOL) 325 MG tablet Take 650 mg by mouth every 6 (six) hours as needed.      Marland Kitchen amLODipine (NORVASC) 10 MG tablet Take 10 mg by mouth daily.      . ARIPiprazole (ABILIFY) 5 MG tablet Take 2.5 mg by mouth daily.      Marland Kitchen aspirin EC 81 MG tablet Take 1 tablet (81 mg total) by mouth daily.      . baclofen (LIORESAL) 20 MG tablet Take 1 tablet (20 mg total) by mouth 3 (three) times daily.  30 each  0  . docusate sodium (COLACE) 100 MG capsule Take 100 mg by mouth daily as needed for mild constipation.      . feeding supplement, ENSURE COMPLETE, (ENSURE COMPLETE) LIQD Take 237 mLs by mouth 2 (two) times daily before lunch and supper.      . isosorbide-hydrALAZINE (BIDIL) 20-37.5 MG per tablet Take 1 tablet by mouth 2 (two) times daily.  30 tablet  0  . pantoprazole (PROTONIX) 40 MG tablet Take 1 tablet (40 mg total) by mouth at bedtime.      Marland Kitchen  pioglitazone (ACTOS) 15 MG tablet Take 15 mg by mouth daily.      . polyethylene glycol (MIRALAX / GLYCOLAX) packet Take 17 g by mouth daily as needed for mild constipation.      . sertraline (ZOLOFT) 50 MG tablet Take 5 tablets (250 mg total) by mouth at bedtime.  20 tablet  0  . simvastatin (ZOCOR) 20 MG tablet Take 1 tablet (20 mg total) by mouth daily at 6 PM.  30 tablet  0  . Vitamin D, Ergocalciferol, (DRISDOL) 50000 UNITS CAPS capsule 50,000 Units every 7 (seven) days.       Marland Kitchen warfarin (COUMADIN) 2.5 MG tablet  Take 2.5 mg by mouth daily.       No facility-administered medications prior to visit.    PHYSICAL EXAM Filed Vitals:   08/01/14 1457  BP: 158/81  Pulse: 56  Height:  (1.727 m)  Weight: 204 lb 6.4 oz (92.715 kg)   Body mass index is 31.09 kg/(m^2).  Generalized: Well developed, obese male in no acute distress  Head: normocephalic and atraumatic,. Oropharynx benign  Neck: Supple, no carotid bruits  Cardiac: Regular rate rhythm, no murmur  Musculoskeletal: No deformity   Neurological examination  Mentation: Alert oriented to time, place, history taking. Follows all commands. speech with mild/moderate dysarthria. Flat affect Cranial nerve II-XII: Pupils were equal round reactive to light extraocular movements were full, visual field were full on confrontational test. Facial sensation normal, Right lower facial weakness. Hearing was intact to finger rubbing bilaterally. Uvula tongue midline.Shoulder shrug impaired on the right.  Motor: 0/5 in RUE except trace R grip, 2- to 2/5 R hip/knee ext synergy otherwise 0/5. Hypertonia 2-3/4 especially bicep, tricep, pec. Hams/calf-1-2/4 Sensory: impaired on right to light touch, pinprick, and vibration, left normal Coordination: impaired on right Reflexes: Left 2+, Right 3+ Gait and Station: Not tested, In wheelchair. NIHSS: 11 mRs: 3  DIAGNOSTIC DATA (LABS, IMAGING, TESTING) - I reviewed patient records, labs, notes, testing and imaging myself where available.  Lab Results  Component Value Date   WBC 8.0 04/26/2014   HGB 11.8* 04/26/2014   HCT 35.1* 04/26/2014   MCV 88.2 04/26/2014   PLT 147* 04/26/2014      Component Value Date/Time   NA 142 04/27/2014 1035   K 4.0 04/27/2014 1035   CL 100 04/27/2014 1035   CO2 25 04/27/2014 1035   GLUCOSE 127* 04/27/2014 1035   BUN 15 04/27/2014 1035   CREATININE 1.22 04/27/2014 1035   CALCIUM 9.6 04/27/2014 1035   PROT 7.6 04/22/2014 1405   ALBUMIN 3.8 04/22/2014 1405   AST 18 04/22/2014 1405   ALT 15  04/22/2014 1405   ALKPHOS 70 04/22/2014 1405   BILITOT 0.4 04/22/2014 1405   GFRNONAA 62* 04/27/2014 1035   GFRAA 72* 04/27/2014 1035   Lab Results  Component Value Date   CHOL 180 04/23/2014   HDL 35* 04/23/2014   LDLCALC 100* 04/23/2014   TRIG 224* 04/23/2014   CHOLHDL 5.1 04/23/2014   Lab Results  Component Value Date   HGBA1C 6.9* 04/23/2014   Lab Results  Component Value Date   TSH 3.840 04/22/2014    ASSESSMENT: 63 y.o. year old male  has a past medical history of Diabetes mellitus; Hypertension; Stroke (July 2013); Stroke (March 2015); Obesity; Depression with anxiety; OSA on CPAP; and Pulmonary emboli (02/2014) here for stroke followup. 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 Now on Chronic coumadin therapy for PE.  PLAN: I had  a long discussion with the patient and family regarding her recent strokes, discussed results of evaluation in the hospital and answered questions.  Continue aspirin 81 mg orally every day and warfarin for pulmonary embolism and for secondary stroke prevention and maintain strict control of hypertension with blood pressure goal below 140/90, diabetes with hemoglobin A1c goal below 6.5% and lipids with LDL cholesterol goal below 70 mg/dL. Followup in the future with me in 6 months, sooner as needed.  Tawny Asal Abelino Tippin, MSN, FNP-BC, A/GNP-C 08/01/2014, 3:12 PM [facility] [address] [place], [place] [address] [phone]  Note: This document was prepared with digital dictation and possible smart phrase technology. Any transcriptional errors that result from this process are unintentional.

## 2014-08-01 NOTE — Patient Instructions (Signed)
Continue aspirin 81 mg orally every day and warfarin for pulmonary embolism and for secondary stroke prevention and maintain strict control of hypertension with blood pressure goal below 140/90, diabetes with hemoglobin A1c goal below 6.5% and lipids with LDL cholesterol goal below 70 mg/dL.  Prescription given for Hemi-Walker. Followup in the future with me in 6 months, sooner as needed.  Stroke Prevention Some medical conditions and behaviors are associated with an increased chance of having a stroke. You may prevent a stroke by making healthy choices and managing medical conditions. HOW CAN I REDUCE MY RISK OF HAVING A STROKE?   Stay physically active. Get at least 30 minutes of activity on most or all days.  Do not smoke. It may also be helpful to avoid exposure to secondhand smoke.  Limit alcohol use. Moderate alcohol use is considered to be:  No more than 2 drinks per day for men.  No more than 1 drink per day for nonpregnant women.  Eat healthy foods. This involves:  Eating 5 or more servings of fruits and vegetables a day.  Making dietary changes that address high blood pressure (hypertension), high cholesterol, diabetes, or obesity.  Manage your cholesterol levels.  Making food choices that are high in fiber and low in saturated fat, trans fat, and cholesterol may control cholesterol levels.  Take any prescribed medicines to control cholesterol as directed by your health care provider.  Manage your diabetes.  Controlling your carbohydrate and sugar intake is recommended to manage diabetes.  Take any prescribed medicines to control diabetes as directed by your health care provider.  Control your hypertension.  Making food choices that are low in salt (sodium), saturated fat, trans fat, and cholesterol is recommended to manage hypertension.  Take any prescribed medicines to control hypertension as directed by your health care provider.  Maintain a healthy  weight.  Reducing calorie intake and making food choices that are low in sodium, saturated fat, trans fat, and cholesterol are recommended to manage weight.  Stop drug abuse.  Avoid taking birth control pills.  Talk to your health care provider about the risks of taking birth control pills if you are over 52 years old, smoke, get migraines, or have ever had a blood clot.  Get evaluated for sleep disorders (sleep apnea).  Talk to your health care provider about getting a sleep evaluation if you snore a lot or have excessive sleepiness.  Take medicines only as directed by your health care provider.  For some people, aspirin or blood thinners (anticoagulants) are helpful in reducing the risk of forming abnormal blood clots that can lead to stroke. If you have the irregular heart rhythm of atrial fibrillation, you should be on a blood thinner unless there is a good reason you cannot take them.  Understand all your medicine instructions.  Make sure that other conditions (such as anemia or atherosclerosis) are addressed. SEEK IMMEDIATE MEDICAL CARE IF:   You have sudden weakness or numbness of the face, arm, or leg, especially on one side of the body.  Your face or eyelid droops to one side.  You have sudden confusion.  You have trouble speaking (aphasia) or understanding.  You have sudden trouble seeing in one or both eyes.  You have sudden trouble walking.  You have dizziness.  You have a loss of balance or coordination.  You have a sudden, severe headache with no known cause.  You have new chest pain or an irregular heartbeat. Any of these symptoms may  represent a serious problem that is an emergency. Do not wait to see if the symptoms will go away. Get medical help at once. Call your local emergency services (911 in [place] Do not drive yourself to the hospital. Document Released: 12/11/2004 Document Revised: 03/20/2014 Document Reviewed: 05/06/2013 [facility] Patient  Information 2015 [place], [place]. This information is not intended to replace advice given to you by your health care provider. Make sure you discuss any questions you have with your health care provider.

## 2014-08-04 NOTE — Telephone Encounter (Signed)
Error, please disregard.

## 2014-08-08 NOTE — Progress Notes (Signed)
I agree with the above plan 

## 2014-08-17 ENCOUNTER — Other Ambulatory Visit: Payer: Self-pay | Admitting: Internal Medicine

## 2014-08-17 ENCOUNTER — Ambulatory Visit
Admission: RE | Admit: 2014-08-17 | Discharge: 2014-08-17 | Disposition: A | Discharge status: Home / Self Care | Payer: Managed Care, Other (non HMO) | Source: Ambulatory Visit | Attending: Internal Medicine | Admitting: Internal Medicine

## 2014-08-17 DIAGNOSIS — Y92009 Unspecified place in unspecified non-institutional (private) residence as the place of occurrence of the external cause: Principal | ICD-10-CM

## 2014-08-17 DIAGNOSIS — W19XXXA Unspecified fall, initial encounter: Secondary | ICD-10-CM

## 2014-08-29 ENCOUNTER — Other Ambulatory Visit: Payer: Self-pay | Admitting: Internal Medicine

## 2014-08-29 DIAGNOSIS — M545 Low back pain, unspecified: Secondary | ICD-10-CM

## 2014-09-11 ENCOUNTER — Other Ambulatory Visit: Payer: Managed Care, Other (non HMO)

## 2014-10-04 ENCOUNTER — Encounter: Payer: Self-pay | Admitting: Neurology

## 2014-10-10 ENCOUNTER — Encounter: Payer: Self-pay | Admitting: Neurology

## 2015-01-17 ENCOUNTER — Encounter: Payer: Self-pay | Admitting: Neurology

## 2015-01-17 ENCOUNTER — Ambulatory Visit: Payer: Managed Care, Other (non HMO) | Admitting: Neurology

## 2015-01-17 ENCOUNTER — Ambulatory Visit (INDEPENDENT_AMBULATORY_CARE_PROVIDER_SITE_OTHER): Payer: Managed Care, Other (non HMO) | Admitting: Neurology

## 2015-01-17 VITALS — BP 154/80 | HR 58 | Ht 68.0 in | Wt 219.0 lb

## 2015-01-17 DIAGNOSIS — G8110 Spastic hemiplegia affecting unspecified side: Secondary | ICD-10-CM

## 2015-01-17 NOTE — Patient Instructions (Signed)
I had a long d/w patient and his wife  about his remote stroke, risk for recurrent stroke/TIAs, personally independently reviewed imaging studies and stroke evaluation results and answered questions.Continue warfarin  for secondary stroke prevention given h/o pulm embolism and maintain strict control of hypertension with blood pressure goal below 130/90, diabetes with hemoglobin A1c goal below 6.5% and lipids with LDL cholesterol goal below 70 mg/dL. Continue ongoing home physical and occupational therapy.  .Followup in the future with me nurse practitioner in 6 months.

## 2015-01-17 NOTE — Progress Notes (Signed)
PATIENT: Dakota Hall DOB: October 01, 1951  REASON FOR VISIT: routine follow up for stroke HISTORY FROM: patient, wife  HISTORY OF PRESENT ILLNESS: Dakota Hall, 64 year old male who comes to the office for first hospital follow up post hospital discharge for stroke. 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 He has vascular risk factors of hypertension, hyperlipidemia, obstructive sleep apnea, diabetes and obesity.   In March, patient was hospitalized for sudden onset of right hemiparesis on 02/03/14. 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 He was given IV tPa. MRI showed a 2 x 2.5 cm region of acute infarction affecting the left basal ganglia and radiating white matter tracts. MRA was negative. 2D Echocardiogram EF 55-60% with no source of embolus. Carotid Doppler showed no evidence of hemodynamically significant internal carotid artery stenosis.  Hbg A1c was 8.2, LDL 113. He was discharged to CIR.  He was then admitted on 03/14/14 with shortness of breath, sats 74% on RA and found to have acute massive PE. He underwent catheter directed Pulmonary emboli lysis with TPA infusion with good results. Anticoagulation was started and he was discharged back to rehab.  He was noticed to have worsening slurred speech the morning of 04/22/14. He was taken to [facility]. Patient wife relates that she had noticed slurred speech intermittent over last 2 weeks prior, worse the morning of admission. Also she has notice difficulty finding words and he had been more sleepy. He had been complaining of metal taste. He was admitted to [facility] service for evalution of CVA. MRI and CT head negative for acute intracranial abnormalities. Appeared to be related to metabolic encephalopathy with worsening renal function and electrolytes disturbances. His encephalopathy resolved during the course of hospitalization.   Since that time he has returned home and is continuing to work on  regaining strength. His INR has been stable and his blood pressure has been under fair control, it is 158/81 in the office today.  He states that he has been compliant with his cpap and trying to do what is needed to get well.  Update 01/17/2015 : He returns for follow-up after last visit 5 months ago. He is accompanied by his wife. Patient continues to have significant residual right-sided weakness with practically no strength in his arm. He is able to walk with a hemiwalker but occasionally loses balance. He still getting home physical and occupational therapy. He cannot do activities that he enjoyed in the past leg doing crafts or yardwork. He remains on warfarin which is tolerating well without bleeding or bruising but his INR doesn't do fluctuate. His blood pressure is well controlled and in the 130s usually. Last A1c was 6. He does take baclofen 10 mg twice daily does complain of mild tiredness but has not tried to increase dose. REVIEW OF SYSTEMS: Full 14 system review of systems performed and notable only for: weakness, leg pain, walking difficulty,  Imbalance,depression.  ALLERGIES: Allergies  Allergen Reactions  . Penicillins Hives    HOME MEDICATIONS: Outpatient Prescriptions Prior to Visit  Medication Sig Dispense Refill  . acetaminophen (TYLENOL) 325 MG tablet Take 650 mg by mouth every 6 (six) hours as needed.    Marland Kitchen amLODipine (NORVASC) 10 MG tablet Take 10 mg by mouth daily.    . ARIPiprazole (ABILIFY) 5 MG tablet Take 2.5 mg by mouth daily.    Marland Kitchen aspirin EC 81 MG tablet Take 1 tablet (81 mg total) by mouth daily.    Marland Kitchen  baclofen (LIORESAL) 20 MG tablet Take 1 tablet (20 mg total) by mouth 3 (three) times daily. 30 each 0  . Blood Glucose Monitoring Suppl (ONE TOUCH ULTRA 2) W/DEVICE KIT     . docusate sodium (COLACE) 100 MG capsule Take 100 mg by mouth daily as needed for mild constipation.    . feeding supplement, ENSURE COMPLETE, (ENSURE COMPLETE) LIQD Take 237 mLs by mouth 2 (two)  times daily before lunch and supper.    . isosorbide-hydrALAZINE (BIDIL) 20-37.5 MG per tablet Take 1 tablet by mouth 2 (two) times daily. 30 tablet 0  . ONE TOUCH ULTRA TEST test strip     . ONETOUCH DELICA LANCETS 51Z MISC     . pantoprazole (PROTONIX) 40 MG tablet Take 1 tablet (40 mg total) by mouth at bedtime.    . pioglitazone (ACTOS) 15 MG tablet Take 15 mg by mouth daily.    . sertraline (ZOLOFT) 50 MG tablet Take 5 tablets (250 mg total) by mouth at bedtime. 20 tablet 0  . simvastatin (ZOCOR) 20 MG tablet Take 1 tablet (20 mg total) by mouth daily at 6 PM. 30 tablet 0  . Vitamin D, Ergocalciferol, (DRISDOL) 50000 UNITS CAPS capsule 50,000 Units every 7 (seven) days.     Marland Kitchen warfarin (COUMADIN) 2.5 MG tablet Take 2.5 mg by mouth daily.    . polyethylene glycol (MIRALAX / GLYCOLAX) packet Take 17 g by mouth daily as needed for mild constipation.     No facility-administered medications prior to visit.    PHYSICAL EXAM Filed Vitals:   01/17/15 1455  BP: 154/80  Pulse: 58  Height: '5\' 8"'  (1.727 m)  Weight: 219 lb (99.338 kg)   Body mass index is 33.31 kg/(m^2).  Generalized: Well developed, obese male in no acute distress  Head: normocephalic and atraumatic,. Oropharynx benign  Neck: Supple, no carotid bruits  Cardiac: Regular rate rhythm, no murmur  Musculoskeletal: No deformity   Neurological examination  Mentation: Alert oriented to time, place, history taking. Follows all commands. speech with mild/moderate dysarthria. Flat affect Cranial nerve II-XII: Pupils were equal round reactive to light extraocular movements were full, visual field were full on confrontational test. Facial sensation normal, Right lower facial weakness. Hearing was intact to finger rubbing bilaterally. Uvula tongue midline.Shoulder shrug impaired on the right.  Motor: 0/5 in RUE except trace R shoulder, 2- to 2/5 R hip/knee ext synergy otherwise 0/5. Hypertonia 2-3/4 especially bicep, tricep, pec.  Hams/calf-1-2/4 Sensory: impaired on right to light touch, pinprick, and vibration, left normal Coordination: impaired on right Reflexes: Left 2+, Right 3+ Gait and Station: Not tested, In wheelchair.   mRs: 3  DIAGNOSTIC DATA (LABS, IMAGING, TESTING) - I reviewed patient records, labs, notes, testing and imaging myself where available.  Lab Results  Component Value Date   WBC 8.0 04/26/2014   HGB 11.8* 04/26/2014   HCT 35.1* 04/26/2014   MCV 88.2 04/26/2014   PLT 147* 04/26/2014      Component Value Date/Time   NA 142 04/27/2014 1035   K 4.0 04/27/2014 1035   CL 100 04/27/2014 1035   CO2 25 04/27/2014 1035   GLUCOSE 127* 04/27/2014 1035   BUN 15 04/27/2014 1035   CREATININE 1.22 04/27/2014 1035   CALCIUM 9.6 04/27/2014 1035   PROT 7.6 04/22/2014 1405   ALBUMIN 3.8 04/22/2014 1405   AST 18 04/22/2014 1405   ALT 15 04/22/2014 1405   ALKPHOS 70 04/22/2014 1405   BILITOT 0.4 04/22/2014 1405  GFRNONAA 62* 04/27/2014 1035   GFRAA 72* 04/27/2014 1035   Lab Results  Component Value Date   CHOL 180 04/23/2014   HDL 35* 04/23/2014   LDLCALC 100* 04/23/2014   TRIG 224* 04/23/2014   CHOLHDL 5.1 04/23/2014   Lab Results  Component Value Date   HGBA1C 6.9* 04/23/2014   Lab Results  Component Value Date   TSH 3.840 04/22/2014    ASSESSMENT: 64 y.o. year old male  has a past medical history of Diabetes mellitus; Hypertension; Stroke (July 2013); Stroke (March 2015); Obesity; Depression with anxiety; OSA on CPAP; and Pulmonary emboli (02/2014) here for stroke followup. 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 Now on Chronic coumadin therapy for PE.  PLAN: I had a long d/w patient and his wife  about his remote stroke, risk for recurrent stroke/TIAs, personally independently reviewed imaging studies and stroke evaluation results and answered questions.Continue warfarin  for secondary stroke prevention given h/o pulm embolism and maintain strict control  of hypertension with blood pressure goal below 130/90, diabetes with hemoglobin A1c goal below 6.5% and lipids with LDL cholesterol goal below 70 mg/dL. Continue ongoing home physical and occupational therapy.  .Followup in the future with me nurse practitioner in 6 months.    Antony Contras, MD 01/17/2015, 9:48 PM [facility] [address], [place], [place] [address] [phone]  Note: This document was prepared with digital dictation and possible smart phrase technology. Any transcriptional errors that result from this process are unintentional.

## 2015-07-25 ENCOUNTER — Ambulatory Visit (INDEPENDENT_AMBULATORY_CARE_PROVIDER_SITE_OTHER): Payer: Managed Care, Other (non HMO) | Admitting: Adult Health

## 2015-07-25 ENCOUNTER — Encounter: Payer: Self-pay | Admitting: Adult Health

## 2015-07-25 VITALS — BP 118/62 | HR 70 | Ht 68.0 in | Wt 223.0 lb

## 2015-07-25 DIAGNOSIS — I6930 Unspecified sequelae of cerebral infarction: Secondary | ICD-10-CM

## 2015-07-25 DIAGNOSIS — G8110 Spastic hemiplegia affecting unspecified side: Secondary | ICD-10-CM

## 2015-07-25 NOTE — Progress Notes (Signed)
I have read the note, and I agree with the clinical assessment and plan.  Kamdyn Covel KEITH   

## 2015-07-25 NOTE — Patient Instructions (Signed)
Continue Coumadin. Blood pressure goal <130/90 HgA1c < 6.5% Cholesterol LDL <100

## 2015-07-25 NOTE — Progress Notes (Signed)
PATIENT: Dakota Hall DOB: 04/15/1951  REASON FOR VISIT: follow up- history of CVA HISTORY FROM: patient  HISTORY OF PRESENT ILLNESS:  [name] is a 64 year old male with a history of stroke with residual right-sided spastic hemiplegia. He returns today for follow-up. The patient remains on Coumadin for stroke prevention as well as history of pulmonary embolism. The patient states that his blood pressure is under good control. The patient states that he will have blood work next week to check his hemoglobin A1c.  The patient remains on Zocor for control of his cholesterol. The patient continues to have right-sided spastic hemiplegia. He is found physical therapy to be very beneficial. However his last treatment was 2 weeks ago. The patient reports that he needs another referral in order to continue this. Patient denies any new neurological symptoms. He states that he does get rather bored at home. Before the stroke the patient was very active doing crafts. He returns today for an evaluation.  HISTORY(SETHI):  [name], 64 year old male who comes to the office for first hospital follow up post hospital discharge for stroke. 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 He has vascular risk factors of hypertension, hyperlipidemia, obstructive sleep apnea, diabetes and obesity.   In March, patient was hospitalized for sudden onset of right hemiparesis on 02/03/14. 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 He was given IV tPa. MRI showed a 2 x 2.5 cm region of acute infarction affecting the left basal ganglia and radiating white matter tracts. MRA was negative. 2D Echocardiogram EF 55-60% with no source of embolus. Carotid Doppler showed no evidence of hemodynamically significant internal carotid artery stenosis. Hbg A1c was 8.2, LDL 113. He was discharged to CIR. He was then admitted on 03/14/14 with shortness of breath, sats 74% on RA and found  to have acute massive PE. He underwent catheter directed Pulmonary emboli lysis with TPA infusion with good results. Anticoagulation was started and he was discharged back to rehab.  He was noticed to have worsening slurred speech the morning of 04/22/14. He was taken to [facility]. Patient wife relates that she had noticed slurred speech intermittent over last 2 weeks prior, worse the morning of admission. Also she has notice difficulty finding words and he had been more sleepy. He had been complaining of metal taste. He was admitted to [facility] service for evalution of CVA. MRI and CT head negative for acute intracranial abnormalities. Appeared to be related to metabolic encephalopathy with worsening renal function and electrolytes disturbances. His encephalopathy resolved during the course of hospitalization.   Since that time he has returned home and is continuing to work on regaining strength. His INR has been stable and his blood pressure has been under fair control, it is 158/81 in the office today. He states that he has been compliant with his cpap and trying to do what is needed to get well.   Update 01/17/2015 : He returns for follow-up after last visit 5 months ago. He is accompanied by his wife. Patient continues to have significant residual right-sided weakness with practically no strength in his arm. He is able to walk with a hemiwalker but occasionally loses balance. He still getting home physical and occupational therapy. He cannot do activities that he enjoyed in the past leg doing crafts or yardwork. He remains on warfarin which is tolerating well without bleeding or bruising but his INR doesn't do fluctuate. His blood pressure is well  controlled and in the 130s usually. Last A1c was 6. He does take baclofen 10 mg twice daily does complain of mild tiredness but has not tried to increase dose.  REVIEW OF SYSTEMS: Out of a complete 14 system review of symptoms, the patient complains only of the following  symptoms, and all other reviewed systems are negative.   apnea, depression  ALLERGIES: Allergies  Allergen Reactions  . Penicillins Hives    HOME MEDICATIONS: Outpatient Prescriptions Prior to Visit  Medication Sig Dispense Refill  . acetaminophen (TYLENOL) 325 MG tablet Take 650 mg by mouth every 6 (six) hours as needed.    Marland Kitchen amLODipine (NORVASC) 10 MG tablet Take 10 mg by mouth daily.    . ARIPiprazole (ABILIFY) 5 MG tablet Take 2.5 mg by mouth daily.    Marland Kitchen aspirin EC 81 MG tablet Take 1 tablet (81 mg total) by mouth daily.    . baclofen (LIORESAL) 20 MG tablet Take 1 tablet (20 mg total) by mouth 3 (three) times daily. 30 each 0  . Blood Glucose Monitoring Suppl (ONE TOUCH ULTRA 2) W/DEVICE KIT     . docusate sodium (COLACE) 100 MG capsule Take 100 mg by mouth daily as needed for mild constipation.    . feeding supplement, ENSURE COMPLETE, (ENSURE COMPLETE) LIQD Take 237 mLs by mouth 2 (two) times daily before lunch and supper.    . isosorbide-hydrALAZINE (BIDIL) 20-37.5 MG per tablet Take 1 tablet by mouth 2 (two) times daily. 30 tablet 0  . ONE TOUCH ULTRA TEST test strip     . ONETOUCH DELICA LANCETS 99J MISC     . pantoprazole (PROTONIX) 40 MG tablet Take 1 tablet (40 mg total) by mouth at bedtime.    . pioglitazone (ACTOS) 15 MG tablet Take 15 mg by mouth daily.    . sertraline (ZOLOFT) 50 MG tablet Take 5 tablets (250 mg total) by mouth at bedtime. 20 tablet 0  . simvastatin (ZOCOR) 20 MG tablet Take 1 tablet (20 mg total) by mouth daily at 6 PM. 30 tablet 0  . Vitamin D, Ergocalciferol, (DRISDOL) 50000 UNITS CAPS capsule 50,000 Units every 7 (seven) days.     Marland Kitchen warfarin (COUMADIN) 2.5 MG tablet Take 2.5 mg by mouth daily.     No facility-administered medications prior to visit.    PAST MEDICAL HISTORY: Past Medical History  Diagnosis Date  . Diabetes mellitus   . Hypertension   . Stroke July 2013    left paramedian pontine, incidental right parietal subcortical. both  d/t small vessel disease  . Stroke March 2015    left basal ganglia secondary to small vessel disease  . Obesity   . Depression with anxiety   . OSA on CPAP   . Pulmonary emboli 02/2014    PAST SURGICAL HISTORY: Past Surgical History  Procedure Laterality Date  . Tonsillectomy    . Anal fissure repair      FAMILY HISTORY: Family History  Problem Relation Age of Onset  . Cancer Mother   . Heart disease Father     SOCIAL HISTORY: Social History   Social History  . Marital Status: Married    Spouse Name: Mickel Baas   . Number of Children: 2  . Years of Education: College    Occupational History  . disabled [employer]  . [employer]   Social History Main Topics  . Smoking status: Never Smoker   . Smokeless tobacco: Never Used  . Alcohol Use:  No  . Drug Use: No  . Sexual Activity: Not on file   Other Topics Concern  . Not on file   Social History Narrative   Patient lives at home with wife Mickel Baas.    Patient has 2 children.    Patient is currently working.    Patient has a Degree.       PHYSICAL EXAM  Filed Vitals:   07/25/15 1520  BP: 118/62  Pulse: 70  Height: '5\' 8"'  (1.727 m)  Weight: 223 lb (101.152 kg)   Body mass index is 33.91 kg/(m^2).  Generalized: Well developed, in no acute distress   Neurological examination  Mentation: Alert oriented to time, place, history taking. Follows all commands speech and language fluent Cranial nerve II-XII: Pupils were equal round reactive to light. Extraocular movements were full, visual field were full on confrontational test. Facial sensation and strength were normal. Uvula tongue midline. Head turning was normal and symmetric. Motor: The motor testing reveals 5 over 5 strength in the right upper and lower extremities. 1/5 in the right upper extremity. 3/5 in the RLE. Marland Kitchen  Sensory: Sensory testing is intact to soft touch on all 4 extremities.   Coordination: Cerebellar testing reveals good  finger-nose-finger and heel-to-shin b on the left. Unable to complete on the right  Gait and station: patient in a wheelchair today. Reflexes: Deep tendon reflexes are symmetric and normal bilaterally.   DIAGNOSTIC DATA (LABS, IMAGING, TESTING) - I reviewed patient records, labs, notes, testing and imaging myself where available.   ASSESSMENT AND PLAN 64 y.o. year old male  has a past medical history of Diabetes mellitus; Hypertension; Stroke (July 2013); Stroke (March 2015); Obesity; Depression with anxiety; OSA on CPAP; and Pulmonary emboli (02/2014). here with:  1. History of stroke 2. Right sided spastic hemiplegia  Continue coumadin for secondary stroke prevention and history of pulmonary embolism and maintain strict control of hypertension with blood pressure goal below 130/90, diabetes with hemoglobin A1c goal below 6.5% and lipids with LDL cholesterol goal below 100. Referral for physical therapy sent.  Recommended that the patient try out PACE program.  He may find it beneficial to get out of the house during the day. If the patient has any strokelike symptoms he was advised to call 911immediately. He will follow-up in 6 months or sooner if needed.  Ward Givens, MSN, NP-C 07/25/2015, 3:18 PM [facility] [address], [place], [place] [address] [phone]

## 2015-08-01 ENCOUNTER — Telehealth: Payer: Self-pay | Admitting: Adult Health

## 2015-08-01 DIAGNOSIS — G8110 Spastic hemiplegia affecting unspecified side: Secondary | ICD-10-CM

## 2015-08-01 DIAGNOSIS — I6930 Unspecified sequelae of cerebral infarction: Secondary | ICD-10-CM

## 2015-08-01 NOTE — Telephone Encounter (Signed)
Alverda Skeans RN will place orders for [employer].

## 2015-08-01 NOTE — Telephone Encounter (Signed)
Patient called stating he needs in-home therapy by [facility] in [place]. He has no transportation for outpt rehab. Please call and advise. Patient can be  reached at [phone].

## 2015-08-01 NOTE — Telephone Encounter (Signed)
Done

## 2015-08-01 NOTE — Telephone Encounter (Signed)
Ok to place order for in patient rehab

## 2015-10-02 IMAGING — CT CT ANGIO HEAD
1 of 11 series · 6 of 33 positions shown · IV contrast (CONTRAST)
Comparison: Non contrast head CT 7027 hr the same day.

CLINICAL DATA: 62-year-old male with right side weakness. Code
stroke. Initial encounter.

EXAM:
CT ANGIOGRAPHY HEAD AND NECK
TECHNIQUE: Multidetector CT imaging of the head and neck was performed using
the standard protocol during bolus administration of intravenous
contrast. Multiplanar CT image reconstructions and MIPs were
obtained to evaluate the vascular anatomy. Carotid stenosis
measurements (when applicable) are obtained utilizing NASCET
criteria, using the distal internal carotid diameter as the
denominator.
CONTRAST:  50mL OMNIPAQUE IOHEXOL 350 MG/ML SOLN

[mpr, ax 1x1 mpr, axial · axial · 0.56mm/px · z∈[+128,+363]mm · 6 of 330 slices shown]
[im 48/330  soft-tissue]
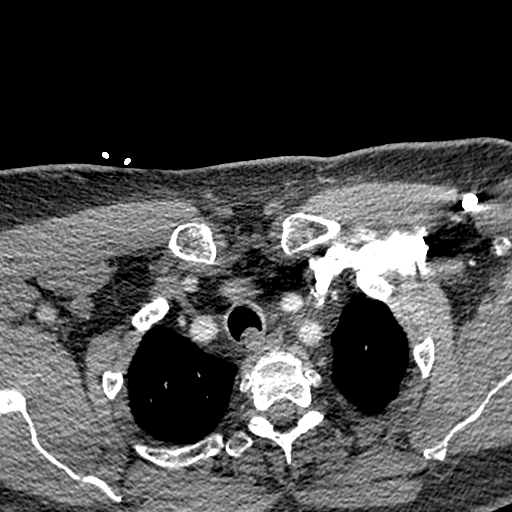
[im 95/330  bone]
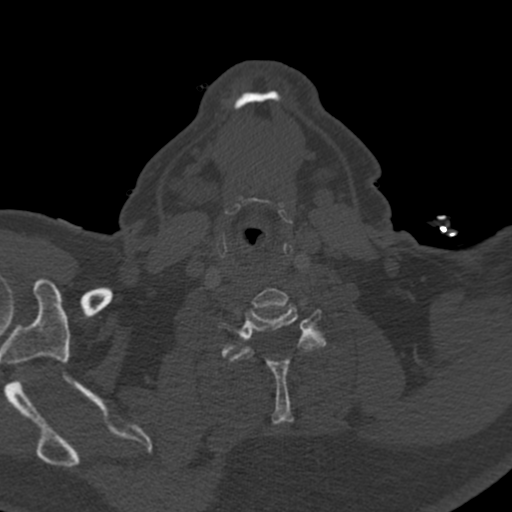
[im 142/330  soft-tissue]
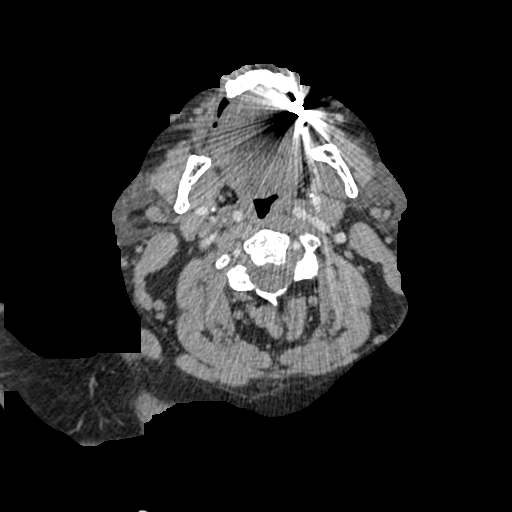
[im 189/330  bone]
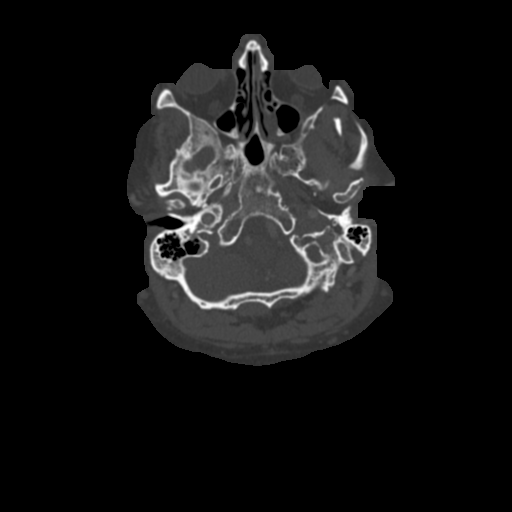
[im 236/330  soft-tissue]
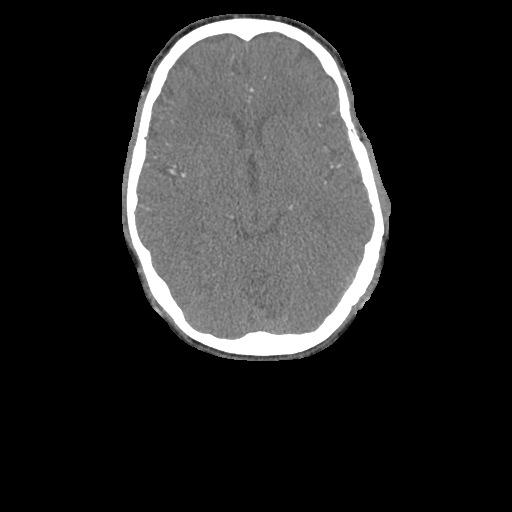
[im 283/330  bone]
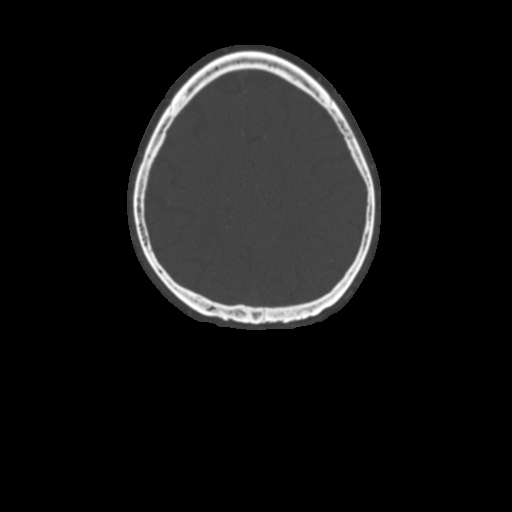

[6 of 33 positions shown; findings below may reference images not displayed]

FINDINGS: CTA HEAD FINDINGS

No ventriculomegaly. No midline shift, mass effect, or evidence of
intracranial mass lesion. No acute intracranial hemorrhage
identified. Patchy and confluent bilateral cerebral white matter
hypodensity is stable. No evidence of cortically based acute
infarction identified.

VASCULAR FINDINGS: Major intracranial venous structures appear
normally enhancing.

Mildly dominant and dolichoectatic distal left vertebral artery.
Knee patent left PICA origin. Calcified plaque in the distal right
vertebral artery without hemodynamically significant stenosis.
Patent right PICA origin.

Patent vertebrobasilar junction. Dolichoectatic basilar artery with
no stenosis. SCA and right PCA origin are normal. Posterior
communicating arteries are diminutive or absent. There is subtle
irregularity of the left PCA P1 segment (series 14553, image 86 and
series [id number], image 151). Preserved distal left PCA flow, and
otherwise the bilateral PCA branches are within normal limits.

Both ICA siphons are patent and moderately dolichoectatic. Mild
calcified plaque. No ICA siphon stenosis. Normal ophthalmic artery
origins.

Patent carotid termini. Normal MCA and ACA origins. Mildly ectatic
bilateral ACA branches. Anterior communicating artery within normal
limits. No ACA stenosis identified. Mildly ectatic right MCA M1
segment. Right MCA branches are within normal limits.

Left MCA M1 segment is patent with mild ectasia. Left MCA
bifurcation is patent. Left MCA branches are within normal limits.

Review of the MIP images confirms the above findings.

CTA NECK FINDINGS

Mediastinal lipomatosis. No superior mediastinal lymphadenopathy.
Mild upper lobe atelectasis. Negative thyroid, larynx, pharynx,
parapharyngeal spaces, retropharyngeal space, sublingual space,
submandibular glands, parotid glands and orbits.

Upper limits of normal level 2 cervical lymph nodes. Other cervical
nodal stations are within normal limits.

No acute osseous abnormality identified. Visualized paranasal
sinuses and mastoids are clear.

VASCULAR FINDINGS: 3 vessel arch configuration. No arch
atherosclerosis. Mildly to moderately tortuous great vessels, mostly
on the left side.

No right CCA stenosis. Minimal calcified plaque the right carotid
bifurcation in the posterior ICA bulb. No cervical right ICA
stenosis. Moderate tortuosity of the cervical right ICA.

No proximal right subclavian artery stenosis. Normal right vertebral
artery origin. Intermittently tortuous but otherwise negative
cervical right vertebral artery.

Tortuous proximal left CCA. Mild calcified plaque at the left
carotid bifurcation. No stenosis. Moderately tortuous cervical left
ICA without stenosis.

No proximal left subclavian artery stenosis. Normal left vertebral
artery origin. Tortuous mildly dolichoectatic proximal left
vertebral artery. The left vertebral artery is mildly dominant
throughout. It demonstrates no stenosis to the skullbase.

Review of the MIP images confirms the above findings.
IMPRESSION: 1. Generalized arterial dolichoectasia with mild atherosclerosis. No
arterial stenosis in the neck. No major circle of Willis branch
occlusion.
2. Mild irregularity of the left PCA P1 segment. Consider left
thalamostriate type small vessel ischemia in this setting.
3. Stable CT appearance of the brain. No evidence of cortically
based acute infarction identified.
Salient findings on this study reviewed in person with Dr. FERIENHAUS
ERUBAEVA on 02/03/2014 at 4444 hr .

## 2016-01-22 ENCOUNTER — Ambulatory Visit (INDEPENDENT_AMBULATORY_CARE_PROVIDER_SITE_OTHER): Payer: PRIVATE HEALTH INSURANCE | Admitting: Adult Health

## 2016-01-22 ENCOUNTER — Encounter: Payer: Self-pay | Admitting: Adult Health

## 2016-01-22 VITALS — BP 137/75 | HR 55 | Ht 68.0 in

## 2016-01-22 DIAGNOSIS — I69359 Hemiplegia and hemiparesis following cerebral infarction affecting unspecified side: Secondary | ICD-10-CM

## 2016-01-22 NOTE — Progress Notes (Addendum)
PATIENT: Dakota Hall DOB: 1951/03/07  REASON FOR VISIT: follow up- stroke HISTORY FROM: patient  HISTORY OF PRESENT ILLNESS: UPDATE 01/22/16: Dakota Hall is a 65 year old male with a history of stroke with residual right-sided spastic plegia. He returns today for follow-up. He is on Coumadin for stroke prevention. He also has a history of pulmonary embolism. His primary care manages his cholesterol, hypertension and diabetes. His blood pressure is in good control today. Patient completed a second round of physical therapy which she found beneficial. The patient uses a wheelchair to ambulate primarily. He does state that he can use a hemiwalker but does not use it as frequently as he should. He states he does have a fear of falling. He denies any new strokelike symptoms. He returns today for an evaluation.  UPDATE 07/25/15 (MM): Dakota Hall is a 65 year old male with a history of stroke with residual right-sided spastic hemiplegia. He returns today for follow-up. The patient remains on Coumadin for stroke prevention as well as history of pulmonary embolism. The patient states that his blood pressure is under good control. The patient states that he will have blood work next week to check his hemoglobin A1c. The patient remains on Zocor for control of his cholesterol. The patient continues to have right-sided spastic hemiplegia. He is found physical therapy to be very beneficial. However his last treatment was 2 weeks ago. The patient reports that he needs another referral in order to continue this. Patient denies any new neurological symptoms. He states that he does get rather bored at home. Before the stroke the patient was very active doing crafts. He returns today for an evaluation.  HISTORY(SETHI):  Dakota Hall, 65 year old male who comes to the office for first hospital follow up post hospital discharge for stroke. 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 He has vascular risk  factors of hypertension, hyperlipidemia, obstructive sleep apnea, diabetes and obesity.   In March, patient was hospitalized for sudden onset of right hemiparesis on 02/03/14. 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 He was given IV tPa. MRI showed a 2 x 2.5 cm region of acute infarction affecting the left basal ganglia and radiating white matter tracts. MRA was negative. 2D Echocardiogram EF 55-60% with no source of embolus. Carotid Doppler showed no evidence of hemodynamically significant internal carotid artery stenosis. Hbg A1c was 8.2, LDL 113. He was discharged to CIR. He was then admitted on 03/14/14 with shortness of breath, sats 74% on RA and found to have acute massive PE. He underwent catheter directed Pulmonary emboli lysis with TPA infusion with good results. Anticoagulation was started and he was discharged back to rehab.  He was noticed to have worsening slurred speech the morning of 04/22/14. He was taken to [facility]. Patient wife relates that she had noticed slurred speech intermittent over last 2 weeks prior, worse the morning of admission. Also she has notice difficulty finding words and he had been more sleepy. He had been complaining of metal taste. He was admitted to [facility] service for evalution of CVA. MRI and CT head negative for acute intracranial abnormalities. Appeared to be related to metabolic encephalopathy with worsening renal function and electrolytes disturbances. His encephalopathy resolved during the course of hospitalization.   Since that time he has returned home and is continuing to work on regaining strength. His INR has been stable and his blood pressure has been under fair control, it is 158/81 in the office  today. He states that he has been compliant with his cpap and trying to do what is needed to get well.   Update 01/17/2015 : He returns for follow-up after last visit 5 months ago. He is accompanied by his wife. Patient continues to  have significant residual right-sided weakness with practically no strength in his arm. He is able to walk with a hemiwalker but occasionally loses balance. He still getting home physical and occupational therapy. He cannot do activities that he enjoyed in the past leg doing crafts or yardwork. He remains on warfarin which is tolerating well without bleeding or bruising but his INR doesn't do fluctuate. His blood pressure is well controlled and in the 130s usually. Last A1c was 6. He does take baclofen 10 mg twice daily does complain of mild tiredness but has not tried to increase dose.   REVIEW OF SYSTEMS: Out of a complete 14 system review of symptoms, the patient complains only of the following symptoms, and all other reviewed systems are negative.  Depression  ALLERGIES: Allergies  Allergen Reactions  . Penicillins Hives    HOME MEDICATIONS: Outpatient Prescriptions Prior to Visit  Medication Sig Dispense Refill  . acetaminophen (TYLENOL) 325 MG tablet Take 650 mg by mouth every 6 (six) hours as needed.    Marland Kitchen amLODipine (NORVASC) 10 MG tablet Take 10 mg by mouth daily.    . ARIPiprazole (ABILIFY) 5 MG tablet Take 2.5 mg by mouth daily.    Marland Kitchen aspirin EC 81 MG tablet Take 1 tablet (81 mg total) by mouth daily.    . baclofen (LIORESAL) 20 MG tablet Take 1 tablet (20 mg total) by mouth 3 (three) times daily. 30 each 0  . Blood Glucose Monitoring Suppl (ONE TOUCH ULTRA 2) W/DEVICE KIT     . docusate sodium (COLACE) 100 MG capsule Take 100 mg by mouth daily as needed for mild constipation.    . feeding supplement, ENSURE COMPLETE, (ENSURE COMPLETE) LIQD Take 237 mLs by mouth 2 (two) times daily before lunch and supper.    . isosorbide-hydrALAZINE (BIDIL) 20-37.5 MG per tablet Take 1 tablet by mouth 2 (two) times daily. 30 tablet 0  . ONE TOUCH ULTRA TEST test strip     . ONETOUCH DELICA LANCETS 92Z MISC     . pantoprazole (PROTONIX) 40 MG tablet Take 1 tablet (40 mg total) by mouth at bedtime.     . pioglitazone (ACTOS) 15 MG tablet Take 15 mg by mouth daily.    . sertraline (ZOLOFT) 50 MG tablet Take 5 tablets (250 mg total) by mouth at bedtime. 20 tablet 0  . simvastatin (ZOCOR) 20 MG tablet Take 1 tablet (20 mg total) by mouth daily at 6 PM. 30 tablet 0  . Vitamin D, Ergocalciferol, (DRISDOL) 50000 UNITS CAPS capsule 50,000 Units every 7 (seven) days.     Marland Kitchen warfarin (COUMADIN) 2.5 MG tablet Take 2.5 mg by mouth daily.     No facility-administered medications prior to visit.    PAST MEDICAL HISTORY: Past Medical History  Diagnosis Date  . Diabetes mellitus   . Hypertension   . Stroke July 2013    left paramedian pontine, incidental right parietal subcortical. both d/t small vessel disease  . Stroke March 2015    left basal ganglia secondary to small vessel disease  . Obesity   . Depression with anxiety   . OSA on CPAP   . Pulmonary emboli 02/2014    PAST SURGICAL HISTORY: Past Surgical History  Procedure Laterality Date  . Tonsillectomy    . Anal fissure repair      FAMILY HISTORY: Family History  Problem Relation Age of Onset  . Cancer Mother   . Heart disease Father     SOCIAL HISTORY: Social History   Social History  . Marital Status: Married    Spouse Name: Mickel Baas   . Number of Children: 2  . Years of Education: College    Occupational History  . disabled [employer]  . [employer]   Social History Main Topics  . Smoking status: Never Smoker   . Smokeless tobacco: Never Used  . Alcohol Use: No  . Drug Use: No  . Sexual Activity: Not on file   Other Topics Concern  . Not on file   Social History Narrative   Patient lives at home with wife Mickel Baas.    Patient has 2 children.    Patient is currently working.    Patient has a Degree.       PHYSICAL EXAM  Filed Vitals:   01/22/16 1549  BP: 137/75  Pulse: 55  Height: 5' 8" (1.727 m)   There is no weight on file to calculate BMI.  Generalized: Well developed,  in no acute distress   Neurological examination  Mentation: Alert oriented to time, place, history taking. Follows all commands speech and language fluent Cranial nerve II-XII: Pupils were equal round reactive to light. Extraocular movements were full, visual field were full on confrontational test. Facial sensation and strength were normal. Uvula tongue midline. Head turning and shoulder shrug  were normal and symmetric. Motor: The motor testing reveals 5 over 5 strength in the right upper and lower extremities. 1/5 in the right upper extremity. 3/5 in the RLE. Marland Kitchen Sensory: Sensory testing is intact to soft touch on all 4 extremities. No evidence of extinction is noted.  Coordination: Cerebellar testing reveals good finger-nose-finger and heel-to-shin bilaterally.  Gait and station: Gait is normal. Tandem gait is normal. Romberg is negative. No drift is seen.  Reflexes: Deep tendon reflexes are symmetric and normal bilaterally.   DIAGNOSTIC DATA (LABS, IMAGING, TESTING) - I reviewed patient records, labs, notes, testing and imaging myself where available.   ASSESSMENT AND PLAN 65 y.o. year old male  has a past medical history of Diabetes mellitus; Hypertension; Stroke (July 2013); Stroke (March 2015); Obesity; Depression with anxiety; OSA on CPAP; and Pulmonary emboli (02/2014). here with:  1. History of stroke 2. Right-sided spastic hemiplegia    Overall the patient is doing well. He will continue on Coumadin for stroke prevention as well as his history of pulmonary embolism. He should maintain strict control of his blood pressure with goal less than 130/90, hemoglobin A1c less than 6.5% and cholesterol specifically the LDL less than 100. Patient advised that if he has any strokelike symptoms he should call 911 immediately. He will follow-up in one year with Dr. Leonie Man.     Ward Givens, MSN, NP-C 01/22/2016, 3:48 PM [facility] [address] [place], [place]  [address] [phone]

## 2016-01-22 NOTE — Patient Instructions (Signed)
Continue Coumadin Blood Pressure <130/90 Cholesterol LDL <100 HgbA1c < 6.5% If your symptoms worsen or you develop new symptoms please let us know.

## 2016-02-05 NOTE — Progress Notes (Signed)
I reviewed note and agree with plan.   Tarick Parenteau R. Danaysia Rader, MD  Certified in Neurology, Neurophysiology and Neuroimaging  [facility] [address], [address] [place], [place] [address] [phone]   

## 2016-02-11 NOTE — Progress Notes (Signed)
I agree with the above plan 

## 2016-05-13 ENCOUNTER — Emergency Department (HOSPITAL_BASED_OUTPATIENT_CLINIC_OR_DEPARTMENT_OTHER): Payer: No Typology Code available for payment source

## 2016-05-13 ENCOUNTER — Emergency Department (HOSPITAL_BASED_OUTPATIENT_CLINIC_OR_DEPARTMENT_OTHER)
Admission: EM | Admit: 2016-05-13 | Discharge: 2016-05-14 | Disposition: A | Discharge status: Home / Self Care | Payer: No Typology Code available for payment source | Attending: Emergency Medicine | Admitting: Emergency Medicine

## 2016-05-13 ENCOUNTER — Emergency Department (HOSPITAL_COMMUNITY): Payer: No Typology Code available for payment source

## 2016-05-13 ENCOUNTER — Encounter (HOSPITAL_BASED_OUTPATIENT_CLINIC_OR_DEPARTMENT_OTHER): Payer: Self-pay

## 2016-05-13 DIAGNOSIS — N200 Calculus of kidney: Secondary | ICD-10-CM | POA: Diagnosis not present

## 2016-05-13 DIAGNOSIS — E669 Obesity, unspecified: Secondary | ICD-10-CM | POA: Insufficient documentation

## 2016-05-13 DIAGNOSIS — E119 Type 2 diabetes mellitus without complications: Secondary | ICD-10-CM | POA: Diagnosis not present

## 2016-05-13 DIAGNOSIS — R1031 Right lower quadrant pain: Secondary | ICD-10-CM

## 2016-05-13 DIAGNOSIS — Z6837 Body mass index (BMI) 37.0-37.9, adult: Secondary | ICD-10-CM | POA: Diagnosis not present

## 2016-05-13 DIAGNOSIS — F329 Major depressive disorder, single episode, unspecified: Secondary | ICD-10-CM | POA: Diagnosis not present

## 2016-05-13 DIAGNOSIS — I10 Essential (primary) hypertension: Secondary | ICD-10-CM | POA: Insufficient documentation

## 2016-05-13 LAB — COMPREHENSIVE METABOLIC PANEL
ALT: 19 U/L (ref 17–63)
ANION GAP: 7 (ref 5–15)
AST: 22 U/L (ref 15–41)
Albumin: 4.6 g/dL (ref 3.5–5.0)
Alkaline Phosphatase: 66 U/L (ref 38–126)
BILIRUBIN TOTAL: 0.5 mg/dL (ref 0.3–1.2)
BUN: 17 mg/dL (ref 6–20)
CO2: 27 mmol/L (ref 22–32)
Calcium: 9.5 mg/dL (ref 8.9–10.3)
Chloride: 107 mmol/L (ref 101–111)
Creatinine, Ser: 1.33 mg/dL — ABNORMAL HIGH (ref 0.61–1.24)
GFR calc Af Amer: >60 mL/min (ref >60)
GFR calc non Af Amer: 55 mL/min — ABNORMAL LOW (ref >60)
Glucose, Bld: 107 mg/dL — ABNORMAL HIGH (ref 65–99)
POTASSIUM: 3.7 mmol/L (ref 3.5–5.1)
SODIUM: 141 mmol/L (ref 135–145)
TOTAL PROTEIN: 8.2 g/dL — AB (ref 6.5–8.1)

## 2016-05-13 LAB — CBC WITH DIFFERENTIAL/PLATELET
BASOS PCT: 0 %
Basophils Absolute: 0 10*3/uL (ref 0.0–0.1)
EOS ABS: 0.1 10*3/uL (ref 0.0–0.7)
Eosinophils Relative: 1 %
HEMATOCRIT: 44.2 % (ref 39.0–52.0)
Hemoglobin: 15.1 g/dL (ref 13.0–17.0)
Lymphocytes Relative: 38 %
Lymphs Abs: 3.1 10*3/uL (ref 0.7–4.0)
MCH: 30.3 pg (ref 26.0–34.0)
MCHC: 34.2 g/dL (ref 30.0–36.0)
MCV: 88.8 fL (ref 78.0–100.0)
MONO ABS: 0.5 10*3/uL (ref 0.1–1.0)
MONOS PCT: 6 %
Neutro Abs: 4.5 10*3/uL (ref 1.7–7.7)
Neutrophils Relative %: 55 %
Platelets: 187 10*3/uL (ref 150–400)
RBC: 4.98 MIL/uL (ref 4.22–5.81)
RDW: 13.7 % (ref 11.5–15.5)
WBC: 8.2 10*3/uL (ref 4.0–10.5)

## 2016-05-13 LAB — URINALYSIS, ROUTINE W REFLEX MICROSCOPIC
BILIRUBIN URINE: NEGATIVE
GLUCOSE, UA: NEGATIVE mg/dL
Ketones, ur: NEGATIVE mg/dL
Nitrite: POSITIVE — AB
Protein, ur: 100 mg/dL — AB
Specific Gravity, Urine: 1.011 (ref 1.005–1.030)
pH: 6.5 (ref 5.0–8.0)

## 2016-05-13 LAB — LIPASE, BLOOD: Lipase: 26 U/L (ref 11–51)

## 2016-05-13 LAB — URINE MICROSCOPIC-ADD ON

## 2016-05-13 MED ORDER — MORPHINE SULFATE (PF) 4 MG/ML IV SOLN
4.0000 mg | Freq: Once | INTRAVENOUS | Status: AC
Start: 2016-05-13 — End: 2016-05-13
  Administered 2016-05-13: 4 mg via INTRAVENOUS
  Filled 2016-05-13: qty 1

## 2016-05-13 MED ORDER — MORPHINE SULFATE 15 MG PO TABS: 15.0000 mg | ORAL_TABLET | ORAL | Status: DC | PRN

## 2016-05-13 MED ORDER — SODIUM CHLORIDE 0.9 % IV BOLUS (SEPSIS)
1000.0000 mL | Freq: Once | INTRAVENOUS | Status: AC
  Administered 2016-05-13: 1000 mL via INTRAVENOUS

## 2016-05-13 MED ORDER — ONDANSETRON 4 MG PO TBDP: ORAL_TABLET | ORAL | Status: DC

## 2016-05-13 MED ORDER — DEXTROSE 5 % IV SOLN
1.0000 g | Freq: Once | INTRAVENOUS | Status: AC
  Administered 2016-05-13: 1 g via INTRAVENOUS
  Filled 2016-05-13: qty 10

## 2016-05-13 MED ORDER — TAMSULOSIN HCL 0.4 MG PO CAPS: 0.4000 mg | ORAL_CAPSULE | Freq: Every day | ORAL | Status: DC

## 2016-05-13 MED ORDER — ONDANSETRON HCL 4 MG/2ML IJ SOLN
4.0000 mg | Freq: Once | INTRAMUSCULAR | Status: AC
  Administered 2016-05-13: 4 mg via INTRAVENOUS
  Filled 2016-05-13: qty 2

## 2016-05-13 MED ORDER — CEFPODOXIME PROXETIL 100 MG PO TABS: 100.0000 mg | ORAL_TABLET | Freq: Two times a day (BID) | ORAL | Status: DC

## 2016-05-13 MED ORDER — IOPAMIDOL (ISOVUE-300) INJECTION 61%
80.0000 mL | Freq: Once | INTRAVENOUS | Status: AC | PRN
  Administered 2016-05-13: 80 mL via INTRAVENOUS

## 2016-05-13 NOTE — ED Provider Notes (Signed)
CSN: [id number]     Arrival date & time 05/13/16  2137 History  By signing my name below, I, [name], attest that this documentation has been prepared under the direction and in the presence of [name], DO. Electronically Signed: [name], ED Scribe. 05/13/2016. 10:24 PM.   Chief Complaint  Patient presents with  . Abdominal Pain    Patient is a 65 y.o. male presenting with abdominal pain. The history is provided by the patient. No language interpreter was used.  Abdominal Pain Pain location:  RLQ Pain radiates to:  Does not radiate Pain severity:  Moderate Onset quality:  Gradual Duration:  5 hours Timing:  Constant Progression:  Worsening Chronicity:  New Context: not alcohol use   Relieved by:  Nothing Worsened by:  Nothing tried Ineffective treatments:  None tried Associated symptoms: no chest pain, no chills, no diarrhea, no fever, no nausea, no shortness of breath and no vomiting   Risk factors: being elderly   Risk factors: no alcohol abuse and no recent hospitalization    HPI Comments: Dakota Hall is a 65 y.o. male with a hx of DM, HTN, stroke, who presents to the Emergency Department complaining of constant, moderate, gradually worsening, non-radiating, RLQ abdominal pain onset 5 hours ago. He reports associated abdominal distension. Wife states that he last had a normal BM earlier today. Pain is unchanged by jarring movements. He notes an hx of hernia repair. He notes similar symptoms in the past when he had a kidney stone. Patient states that he takes stool softeners and Warfarin regularly. Denies fevers, chills, nausea, vomiting, diarrhea, or any other symptoms currently.  Past Medical History  Diagnosis Date  . Diabetes mellitus   . Hypertension   . Stroke [facility]) July 2013    left paramedian pontine, incidental right parietal subcortical. both d/t small vessel disease  . Stroke [facility]) March 2015    left basal ganglia secondary to small vessel  disease  . Obesity   . Depression with anxiety   . OSA on CPAP   . Pulmonary emboli ([place]) 02/2014   Past Surgical History  Procedure Laterality Date  . Tonsillectomy    . Anal fissure repair     Family History  Problem Relation Age of Onset  . Cancer Mother   . Heart disease Father    Social History  Substance Use Topics  . Smoking status: Never Smoker   . Smokeless tobacco: Never Used  . Alcohol Use: No    Review of Systems  Constitutional: Negative for fever and chills.  HENT: Negative for congestion and facial swelling.   Eyes: Negative for discharge and visual disturbance.  Respiratory: Negative for shortness of breath.   Cardiovascular: Negative for chest pain and palpitations.  Gastrointestinal: Positive for abdominal pain. Negative for nausea, vomiting and diarrhea.  Musculoskeletal: Negative for myalgias and arthralgias.  Skin: Negative for color change and rash.  Neurological: Negative for tremors, syncope and headaches.  Psychiatric/Behavioral: Negative for confusion and dysphoric mood.      Allergies  Penicillins  Home Medications   Prior to Admission medications   Medication Sig Start Date End Date Taking? Authorizing Provider  acetaminophen (TYLENOL) 325 MG tablet Take 650 mg by mouth every 6 (six) hours as needed.    Historical Provider, MD  amLODipine (NORVASC) 10 MG tablet Take 10 mg by mouth daily.    Historical Provider, MD  ARIPiprazole (ABILIFY) 5 MG tablet Take 2.5 mg by mouth daily.  Historical Provider, MD  baclofen (LIORESAL) 20 MG tablet Take 1 tablet (20 mg total) by mouth 3 (three) times daily. 03/03/14   Bary Leriche, PA-C  Blood Glucose Monitoring Suppl (ONE TOUCH ULTRA 2) W/DEVICE KIT  05/14/14   Historical Provider, MD  cefpodoxime (VANTIN) 100 MG tablet Take 1 tablet (100 mg total) by mouth 2 (two) times daily. 05/13/16   [name], DO  docusate sodium (COLACE) 100 MG capsule Take 100 mg by mouth daily as needed for mild constipation.     Historical Provider, MD  isosorbide-hydrALAZINE (BIDIL) 20-37.5 MG per tablet Take 1 tablet by mouth 2 (two) times daily. 04/26/14   Hosie Poisson, MD  morphine (MSIR) 15 MG tablet Take 1 tablet (15 mg total) by mouth every 4 (four) hours as needed for severe pain. 05/13/16   [name], DO  ondansetron (ZOFRAN ODT) 4 MG disintegrating tablet 93m ODT q4 hours prn nausea/vomit 05/13/16   DDeno Etienne DO  ONE TOUCH ULTRA TEST test strip  05/14/14   Historical Provider, MD  OJonetta SpeakLANCETS [place] 05/14/14   Historical Provider, MD  pantoprazole (PROTONIX) 40 MG tablet Take 1 tablet (40 mg total) by mouth at bedtime. 03/03/14   PIvan AnchorsLove, PA-C  pioglitazone (ACTOS) 15 MG tablet Take 15 mg by mouth daily.    Historical Provider, MD  sertraline (ZOLOFT) 50 MG tablet Take 5 tablets (250 mg total) by mouth at bedtime. 04/26/14   VHosie Poisson MD  simvastatin (ZOCOR) 20 MG tablet Take 1 tablet (20 mg total) by mouth daily at 6 PM. 05/18/12   SDebbe Odea MD  tamsulosin (FLOMAX) 0.4 MG CAPS capsule Take 1 capsule (0.4 mg total) by mouth daily after supper. 05/13/16   DDeno Etienne DO  Vitamin D, Ergocalciferol, (DRISDOL) 50000 UNITS CAPS capsule 50,000 Units every 7 (seven) days.  06/26/13   Historical Provider, MD  warfarin (COUMADIN) 2.5 MG tablet Take 2.5 mg by mouth daily. Take 2.5 every other day for 3 days,other days 5.0 for 4 days    Historical Provider, MD   BP 198/85 mmHg  Pulse 59  Temp(Src) 98.9 F (37.2 C) (Oral)  Resp 20  Ht '5\' 8"'  (1.727 m)  Wt 245 lb (111.131 kg)  BMI 37.26 kg/m2  SpO2 97% Physical Exam  Constitutional: He is oriented to person, place, and time. He appears well-developed and well-nourished.  HENT:  Head: Normocephalic and atraumatic.  Eyes: EOM are normal. Pupils are equal, round, and reactive to light.  Neck: Normal range of motion. Neck supple. No JVD present.  Cardiovascular: Normal rate and regular rhythm.  Exam reveals no gallop and no friction rub.   No  murmur heard. Pulmonary/Chest: No respiratory distress. He has no wheezes.  Abdominal: He exhibits no distension. There is generalized tenderness. There is guarding. There is no rebound.  Diffuse abdominal tenderness, worse in RLQ. Diffuse guarding. No rebound. Distension tympanic to percussion.  Musculoskeletal: Normal range of motion.  Neurological: He is alert and oriented to person, place, and time.  Skin: No rash noted. No pallor.  Psychiatric: He has a normal mood and affect. His behavior is normal.  Nursing note and vitals reviewed.   ED Course  Procedures   DIAGNOSTIC STUDIES: Oxygen Saturation is 97% on RA, normal by my interpretation.    COORDINATION OF CARE: 10:05 PM Will order morphine, Zofran, IV fluids, labs, and abdomen CT. Discussed treatment plan with pt at bedside and pt agreed to plan.  Labs Review Labs Reviewed  URINALYSIS, ROUTINE W REFLEX MICROSCOPIC (NOT AT [facility]) - Abnormal; Notable for the following:    APPearance HAZY (*)    Hgb urine dipstick LARGE (*)    Protein, ur 100 (*)    Nitrite POSITIVE (*)    Leukocytes, UA SMALL (*)    All other components within normal limits  COMPREHENSIVE METABOLIC PANEL - Abnormal; Notable for the following:    Glucose, Bld 107 (*)    Creatinine, Ser 1.33 (*)    Total Protein 8.2 (*)    GFR calc non Af Amer 55 (*)    All other components within normal limits  URINE MICROSCOPIC-ADD ON - Abnormal; Notable for the following:    Squamous Epithelial / LPF 0-5 (*)    Bacteria, UA MANY (*)    All other components within normal limits  URINE CULTURE  CBC WITH DIFFERENTIAL/PLATELET  LIPASE, BLOOD    Imaging Review Ct Abdomen Pelvis W Contrast  05/13/2016  CLINICAL DATA:  Right lower quadrant pain for 6 hours.  Hematuria. EXAM: CT ABDOMEN AND PELVIS WITH CONTRAST TECHNIQUE: Multidetector CT imaging of the abdomen and pelvis was performed using the standard protocol following bolus administration of intravenous contrast.  CONTRAST:  89m ISOVUE-300 IOPAMIDOL (ISOVUE-300) INJECTION 61% COMPARISON:  08/17/2014 FINDINGS: Lower chest:  No significant abnormality Hepatobiliary: There are normal appearances of the liver, gallbladder and bile ducts. Pancreas: Normal Spleen: Normal Adrenals/Urinary Tract: Both adrenals are normal. There is moderate right hydronephrosis and proximal hydroureter due to an obstructing 4 x 5 mm calculus at the L4 level. No other acute findings are evident. No significant parenchymal renal lesions. Left ureter is unremarkable. Urinary bladder is unremarkable. Stomach/Bowel: Stomach, small bowel and colon are unremarkable. Vascular/Lymphatic: The abdominal aorta is normal in caliber. There is mild atherosclerotic calcification. There is no adenopathy in the abdomen or pelvis. Reproductive: Unremarkable Other: No ascites. Musculoskeletal: No significant skeletal lesion. Stable L1 compression deformity IMPRESSION: Obstructing 4 x 5 mm right ureteral calculus at the L4 level. Moderate hydronephrosis. Electronically Signed   By: DAndreas NewportM.D.   On: 05/13/2016 23:40   I have personally reviewed and evaluated these images and lab results as part of my medical decision-making.   EKG Interpretation None      MDM   Final diagnoses:  Right lower quadrant abdominal pain  Nephrolithiasis    65yo M With a chief complaint of right lower quadrant abdominal pain. This been going on just for the past 5 hours. Patient is sharp severe and consistent. Patient has a history of kidney stones in the past but thinks this feels different. This pain has been more constant. Nothing seems to make it worse.  On my exam patient with significant right lower quadrant tenderness. Patient does have a history of prior abdominal surgery and has a tympanitic abdomen. He has no nausea or vomiting but I'm concerned that this may be an early bowel obstruction versus appendicitis. Will obtain a CT scan of the abdomen and  pelvis contrast.  CT scan is consistent with a kidney stone. Patient feeling much better on my reassessment. We'll have him follow with urology. Patient also found to have urinary tract infection on UA. No fevers or elevated white blood cell count. Given Rocephin. Will start on Vantin. Urine culture sent.  I personally performed the services described in this documentation, which was scribed in my presence. The recorded information has been reviewed and is accurate.   11:56 PM:  I have discussed the diagnosis/risks/treatment options with the patient and family and believe the pt to be eligible for discharge home to follow-up with Urology. We also discussed returning to the ED immediately if new or worsening sx occur. We discussed the sx which are most concerning (e.g., sudden worsening pain, fever, inability to tolerate by mouth) that necessitate immediate return. Medications administered to the patient during their visit and any new prescriptions provided to the patient are listed below.  Medications given during this visit Medications  morphine 4 MG/ML injection 4 mg (4 mg Intravenous Given 05/13/16 2229)  ondansetron (ZOFRAN) injection 4 mg (4 mg Intravenous Given 05/13/16 2229)  sodium chloride 0.9 % bolus 1,000 mL (0 mLs Intravenous Stopped 05/13/16 2356)  cefTRIAXone (ROCEPHIN) 1 g in dextrose 5 % 50 mL IVPB (1 g Intravenous New Bag/Given 05/13/16 2255)  iopamidol (ISOVUE-300) 61 % injection 80 mL (80 mLs Intravenous Contrast Given 05/13/16 2311)    New Prescriptions   CEFPODOXIME (VANTIN) 100 MG TABLET    Take 1 tablet (100 mg total) by mouth 2 (two) times daily.   MORPHINE (MSIR) 15 MG TABLET    Take 1 tablet (15 mg total) by mouth every 4 (four) hours as needed for severe pain.   ONDANSETRON (ZOFRAN ODT) 4 MG DISINTEGRATING TABLET    62m ODT q4 hours prn nausea/vomit   TAMSULOSIN (FLOMAX) 0.4 MG CAPS CAPSULE    Take 1 capsule (0.4 mg total) by mouth daily after supper.    The patient  appears reasonably screen and/or stabilized for discharge and I doubt any other medical condition or other [facility]iring further screening, evaluation, or treatment in the ED at this time prior to discharge.    DDeno Etienne DO 05/13/16 2356

## 2016-05-13 NOTE — ED Notes (Signed)
Right side abd pain since 5pm blood in urine x 1 hour-presents to triage in own w/c-wife with pt

## 2016-05-16 LAB — URINE CULTURE

## 2016-05-17 ENCOUNTER — Telehealth (HOSPITAL_BASED_OUTPATIENT_CLINIC_OR_DEPARTMENT_OTHER): Payer: Self-pay

## 2016-05-17 NOTE — Telephone Encounter (Signed)
Post ED Visit - Positive Culture Follow-up  Culture report reviewed by antimicrobial stewardship pharmacist:  []  Dakota Hall, Pharm.D. []  Dakota Hall, Pharm.D., BCPS []  Dakota Hall, Pharm.D. []  Dakota Hall, Pharm.D., BCPS []  Dakota Hall, [address]Ph[address] BCPS, AAHIVP []  Dakota Hall, Pharm.D., BCPS, AAHIVP [x]  Dakota Hall, Pharm.D. []  Dakota Hall, [address]Ph[address]  Positive urine culture Treated with Cefpodoxime Proxetil, organism sensitive to the same and no further patient follow-up is required at this time.  Dakota Hall, Dakota Hall 05/17/2016, 2:14 PM

## 2016-05-30 DIAGNOSIS — Z7901 Long term (current) use of anticoagulants: Secondary | ICD-10-CM | POA: Diagnosis not present

## 2016-06-30 DIAGNOSIS — Z7901 Long term (current) use of anticoagulants: Secondary | ICD-10-CM | POA: Diagnosis not present

## 2016-07-25 DIAGNOSIS — Z7901 Long term (current) use of anticoagulants: Secondary | ICD-10-CM | POA: Diagnosis not present

## 2016-08-22 DIAGNOSIS — I69151 Hemiplegia and hemiparesis following nontraumatic intracerebral hemorrhage affecting right dominant side: Secondary | ICD-10-CM | POA: Diagnosis not present

## 2016-08-22 DIAGNOSIS — I10 Essential (primary) hypertension: Secondary | ICD-10-CM | POA: Diagnosis not present

## 2016-08-22 DIAGNOSIS — G4700 Insomnia, unspecified: Secondary | ICD-10-CM | POA: Diagnosis not present

## 2016-08-22 DIAGNOSIS — E1165 Type 2 diabetes mellitus with hyperglycemia: Secondary | ICD-10-CM | POA: Diagnosis not present

## 2016-08-22 DIAGNOSIS — Z23 Encounter for immunization: Secondary | ICD-10-CM | POA: Diagnosis not present

## 2016-08-22 DIAGNOSIS — Z7901 Long term (current) use of anticoagulants: Secondary | ICD-10-CM | POA: Diagnosis not present

## 2016-08-22 DIAGNOSIS — I69322 Dysarthria following cerebral infarction: Secondary | ICD-10-CM | POA: Diagnosis not present

## 2016-08-22 DIAGNOSIS — G4730 Sleep apnea, unspecified: Secondary | ICD-10-CM | POA: Diagnosis not present

## 2016-08-22 DIAGNOSIS — M62830 Muscle spasm of back: Secondary | ICD-10-CM | POA: Diagnosis not present

## 2016-08-22 DIAGNOSIS — E559 Vitamin D deficiency, unspecified: Secondary | ICD-10-CM | POA: Diagnosis not present

## 2016-09-02 ENCOUNTER — Ambulatory Visit: Payer: PRIVATE HEALTH INSURANCE | Admitting: Sports Medicine

## 2016-09-09 ENCOUNTER — Ambulatory Visit: Payer: Commercial Managed Care - HMO | Admitting: Sports Medicine

## 2016-09-19 DIAGNOSIS — Z7901 Long term (current) use of anticoagulants: Secondary | ICD-10-CM | POA: Diagnosis not present

## 2016-09-23 ENCOUNTER — Ambulatory Visit: Payer: Commercial Managed Care - HMO | Admitting: Sports Medicine

## 2016-10-07 DIAGNOSIS — F321 Major depressive disorder, single episode, moderate: Secondary | ICD-10-CM | POA: Diagnosis not present

## 2016-10-14 ENCOUNTER — Encounter: Payer: Self-pay | Admitting: Sports Medicine

## 2016-10-14 ENCOUNTER — Ambulatory Visit (INDEPENDENT_AMBULATORY_CARE_PROVIDER_SITE_OTHER): Payer: Commercial Managed Care - HMO | Admitting: Sports Medicine

## 2016-10-14 DIAGNOSIS — B351 Tinea unguium: Secondary | ICD-10-CM

## 2016-10-14 DIAGNOSIS — M79674 Pain in right toe(s): Secondary | ICD-10-CM | POA: Diagnosis not present

## 2016-10-14 DIAGNOSIS — M79675 Pain in left toe(s): Secondary | ICD-10-CM

## 2016-10-14 DIAGNOSIS — E119 Type 2 diabetes mellitus without complications: Secondary | ICD-10-CM | POA: Diagnosis not present

## 2016-10-14 DIAGNOSIS — I739 Peripheral vascular disease, unspecified: Secondary | ICD-10-CM

## 2016-10-14 DIAGNOSIS — R531 Weakness: Secondary | ICD-10-CM | POA: Diagnosis not present

## 2016-10-14 NOTE — Patient Instructions (Signed)

## 2016-10-14 NOTE — Progress Notes (Signed)
Subjective: Dakota Hall is a 65 y.o. male patient with history of diabetes who presents to office today complaining of long, painful nails  while ambulating in shoes; unable to trim. Patient states that the glucose reading this morning was not recorded but A1c recorded in office last month. Patient denies any new changes in medication or new problems. Patient denies any new cramping, numbness, burning or tingling in the legs.  History of stroke with right sided weakness.   Patient Active Problem List   Diagnosis Date Noted  . Pulmonary embolism ([place]) 05/03/2014  . AKI (acute kidney injury) ([place]) 04/22/2014  . Bradycardia 04/22/2014  . Encephalopathy 04/22/2014  . Malnutrition of moderate degree ([place]) 03/15/2014  . CVA (cerebral infarction) 02/08/2014  . Depression with anxiety   . 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 Obesity   . Stroke ([place]) 02/03/2014  . Unspecified cerebral artery occlusion with cerebral infarction 07/20/2013  . Stroke, small vessel ([place]) 05/18/2012  . Slurred speech 05/15/2012  . HTN (hypertension) 05/15/2012  . Noncompliance with medication regimen 05/15/2012  . DM (diabetes mellitus) ([place]) 05/15/2012  . Vision changes 05/15/2012   Current Outpatient Prescriptions on File Prior to Visit  Medication Sig Dispense Refill  . acetaminophen (TYLENOL) 325 MG tablet Take 650 mg by mouth every 6 (six) hours as needed.    Marland Kitchen amLODipine (NORVASC) 10 MG tablet Take 10 mg by mouth daily.    . ARIPiprazole (ABILIFY) 5 MG tablet Take 2.5 mg by mouth daily.    . baclofen (LIORESAL) 20 MG tablet Take 1 tablet (20 mg total) by mouth 3 (three) times daily. 30 each 0  . Blood Glucose Monitoring Suppl (ONE TOUCH ULTRA 2) W/DEVICE KIT     . cefpodoxime (VANTIN) 100 MG tablet Take 1 tablet (100 mg total) by mouth 2 (two) times daily. 14 tablet 0  . docusate sodium (COLACE) 100 MG capsule Take 100 mg by mouth daily as needed for mild constipation.    . isosorbide-hydrALAZINE (BIDIL)  20-37.5 MG per tablet Take 1 tablet by mouth 2 (two) times daily. 30 tablet 0  . morphine (MSIR) 15 MG tablet Take 1 tablet (15 mg total) by mouth every 4 (four) hours as needed for severe pain. 11 tablet 0  . ondansetron (ZOFRAN ODT) 4 MG disintegrating tablet 72m ODT q4 hours prn nausea/vomit 20 tablet 0  . ONE TOUCH ULTRA TEST test strip     . ONETOUCH DELICA LANCETS [id number]     . pantoprazole (PROTONIX) 40 MG tablet Take 1 tablet (40 mg total) by mouth at bedtime.    . pioglitazone (ACTOS) 15 MG tablet Take 15 mg by mouth daily.    . sertraline (ZOLOFT) 50 MG tablet Take 5 tablets (250 mg total) by mouth at bedtime. 20 tablet 0  . simvastatin (ZOCOR) 20 MG tablet Take 1 tablet (20 mg total) by mouth daily at 6 PM. 30 tablet 0  . tamsulosin (FLOMAX) 0.4 MG CAPS capsule Take 1 capsule (0.4 mg total) by mouth daily after supper. 30 capsule 0  . Vitamin D, Ergocalciferol, (DRISDOL) 50000 UNITS CAPS capsule 50,000 Units every 7 (seven) days.     .Marland Kitchenwarfarin (COUMADIN) 2.5 MG tablet Take 2.5 mg by mouth daily. Take 2.5 every other day for 3 days,other days 5.0 for 4 days     No current facility-administered medications on file prior to visit.    Allergies  Allergen Reactions  . Penicillins Hives    No results found for this  or any previous visit (from the past 2160 hour(s)).  Objective: General: Patient is awake, alert, and oriented x 3 and in no acute distress uses wheelchair.  Integument: Skin is warm, dry and supple bilateral. Nails are tender, mildly elongated, thickened and  dystrophic with subungual debris, consistent with onychomycosis, 1-5 bilateral. No signs of infection. No open lesions or preulcerative lesions present bilateral. Remaining integument unremarkable.  Vasculature:  Dorsalis Pedis pulse 1/4 bilateral. Posterior Tibial pulse  1/4 bilateral.  Capillary fill time <4 sec 1-5 bilateral. Scant hair growth to the level of the digits. Temperature gradient within normal  limits. No varicosities present bilateral. No edema present bilateral.   Neurology: The patient has intact sensation measured with a 5.07/10g Semmes Weinstein Monofilament at all pedal sites bilateral. Vibratory sensation diminished bilateral with tuning fork. No Babinski sign present bilateral. Reflexes delayed on right, history of stroke  Musculoskeletal: Right sided weakness secondary to stroke. No tenderness with calf compression bilateral.  Assessment and Plan: Problem List Items Addressed This Visit    None    Visit Diagnoses    Dermatophytosis of nail    -  Primary   Toe pain, bilateral       PVD (peripheral vascular disease) ([place])       Diabetes mellitus without complication ([place])       Right sided weakness          -Examined patient. -Discussed and educated patient on diabetic foot care, especially with regards to the vascular, neurological and musculoskeletal systems.  -Stressed the importance of good glycemic control and the detriment of not controlling glucose levels in relation to the foot. -Mechanically debrided all nails 1-5 bilateral using sterile nail nipper and filed with dremel without incident  -Answered all patient questions -Patient to return  in 3 months for at risk foot care -Patient advised to call the office if any problems or questions arise in the meantime.  Landis Martins, DPM

## 2016-10-17 DIAGNOSIS — N183 Chronic kidney disease, stage 3 (moderate): Secondary | ICD-10-CM | POA: Diagnosis not present

## 2016-10-17 DIAGNOSIS — R600 Localized edema: Secondary | ICD-10-CM | POA: Diagnosis not present

## 2016-10-17 DIAGNOSIS — I10 Essential (primary) hypertension: Secondary | ICD-10-CM | POA: Diagnosis not present

## 2016-10-17 DIAGNOSIS — I69151 Hemiplegia and hemiparesis following nontraumatic intracerebral hemorrhage affecting right dominant side: Secondary | ICD-10-CM | POA: Diagnosis not present

## 2016-10-17 DIAGNOSIS — Z5181 Encounter for therapeutic drug level monitoring: Secondary | ICD-10-CM | POA: Diagnosis not present

## 2016-10-17 DIAGNOSIS — Z7901 Long term (current) use of anticoagulants: Secondary | ICD-10-CM | POA: Diagnosis not present

## 2016-10-24 DIAGNOSIS — Z7901 Long term (current) use of anticoagulants: Secondary | ICD-10-CM | POA: Diagnosis not present

## 2016-11-07 DIAGNOSIS — Z7901 Long term (current) use of anticoagulants: Secondary | ICD-10-CM | POA: Diagnosis not present

## 2016-11-21 DIAGNOSIS — Z7901 Long term (current) use of anticoagulants: Secondary | ICD-10-CM | POA: Diagnosis not present

## 2016-12-26 DIAGNOSIS — Z7901 Long term (current) use of anticoagulants: Secondary | ICD-10-CM | POA: Diagnosis not present

## 2017-01-13 ENCOUNTER — Ambulatory Visit: Payer: Commercial Managed Care - HMO | Admitting: Podiatry

## 2017-01-23 DIAGNOSIS — Z7901 Long term (current) use of anticoagulants: Secondary | ICD-10-CM | POA: Diagnosis not present

## 2017-01-26 ENCOUNTER — Ambulatory Visit: Payer: PRIVATE HEALTH INSURANCE | Admitting: Neurology

## 2017-02-27 DIAGNOSIS — Z7901 Long term (current) use of anticoagulants: Secondary | ICD-10-CM | POA: Diagnosis not present

## 2017-03-13 DIAGNOSIS — N183 Chronic kidney disease, stage 3 (moderate): Secondary | ICD-10-CM | POA: Diagnosis not present

## 2017-03-13 DIAGNOSIS — E1165 Type 2 diabetes mellitus with hyperglycemia: Secondary | ICD-10-CM | POA: Diagnosis not present

## 2017-03-13 DIAGNOSIS — E559 Vitamin D deficiency, unspecified: Secondary | ICD-10-CM | POA: Diagnosis not present

## 2017-03-13 DIAGNOSIS — Z0000 Encounter for general adult medical examination without abnormal findings: Secondary | ICD-10-CM | POA: Diagnosis not present

## 2017-03-13 DIAGNOSIS — I10 Essential (primary) hypertension: Secondary | ICD-10-CM | POA: Diagnosis not present

## 2017-03-13 DIAGNOSIS — R8290 Unspecified abnormal findings in urine: Secondary | ICD-10-CM | POA: Diagnosis not present

## 2017-03-13 DIAGNOSIS — K429 Umbilical hernia without obstruction or gangrene: Secondary | ICD-10-CM | POA: Diagnosis not present

## 2017-03-13 DIAGNOSIS — Z7901 Long term (current) use of anticoagulants: Secondary | ICD-10-CM | POA: Diagnosis not present

## 2017-03-13 DIAGNOSIS — E1122 Type 2 diabetes mellitus with diabetic chronic kidney disease: Secondary | ICD-10-CM | POA: Diagnosis not present

## 2017-03-13 DIAGNOSIS — I69151 Hemiplegia and hemiparesis following nontraumatic intracerebral hemorrhage affecting right dominant side: Secondary | ICD-10-CM | POA: Diagnosis not present

## 2017-03-24 ENCOUNTER — Encounter (INDEPENDENT_AMBULATORY_CARE_PROVIDER_SITE_OTHER): Payer: Self-pay

## 2017-03-24 ENCOUNTER — Ambulatory Visit (INDEPENDENT_AMBULATORY_CARE_PROVIDER_SITE_OTHER): Payer: Medicare HMO | Admitting: Neurology

## 2017-03-24 ENCOUNTER — Encounter: Payer: Self-pay | Admitting: Neurology

## 2017-03-24 VITALS — BP 146/73 | HR 57 | Ht 68.0 in | Wt 246.0 lb

## 2017-03-24 DIAGNOSIS — I6529 Occlusion and stenosis of unspecified carotid artery: Secondary | ICD-10-CM | POA: Diagnosis not present

## 2017-03-24 DIAGNOSIS — G8110 Spastic hemiplegia affecting unspecified side: Secondary | ICD-10-CM | POA: Diagnosis not present

## 2017-03-24 NOTE — Patient Instructions (Signed)
I had a long d/w patient about his recent stroke, risk for recurrent stroke/TIAs, personally independently reviewed imaging studies and stroke evaluation results and answered questions.Continue warfarin daily  for h/o pulmonary embolism for secondary stroke prevention and maintain strict control of hypertension with blood pressure goal below 130/90, diabetes with hemoglobin A1c goal below 6.5% and lipids with LDL cholesterol goal below 70 mg/dL. I also advised the patient to eat a healthy diet with plenty of whole grains, cereals, fruits and vegetables, exercise regularly and maintain ideal body weight. We also discussed fall and safety precautions. Check screening carotid ultrasound study. Since it has been more than 3 years since his last stroke I do not believe routine scheduled follow-up appointment with me is necessary. He may be referred back in the future by his primary care physician as needed.

## 2017-03-24 NOTE — Progress Notes (Signed)
  PATIENT: Dakota Hall DOB: 01/27/1951  REASON FOR VISIT: follow up- stroke HISTORY FROM: patient  HISTORY OF PRESENT ILLNESS: UPDATE 01/22/16: Dakota Hall is a 66-year-old male with a history of stroke with residual right-sided spastic plegia. He returns today for follow-up. He is on Coumadin for stroke prevention. He also has a history of pulmonary embolism. His primary care manages his cholesterol, hypertension and diabetes. His blood pressure is in good control today. Patient completed a second round of physical therapy which she found beneficial. The patient uses a wheelchair to ambulate primarily. He does state that he can use a hemiwalker but does not use it as frequently as he should. He states he does have a fear of falling. He denies any new strokelike symptoms. He returns today for an evaluation.  UPDATE 07/25/15 (MM): Dakota Hall is a 66-year-old male with a history of stroke with residual right-sided spastic hemiplegia. He returns today for follow-up. The patient remains on Coumadin for stroke prevention as well as history of pulmonary embolism. The patient states that his blood pressure is under good control. The patient states that he will have blood work next week to check his hemoglobin A1c. The patient remains on Zocor for control of his cholesterol. The patient continues to have right-sided spastic hemiplegia. He is found physical therapy to be very beneficial. However his last treatment was 2 weeks ago. The patient reports that he needs another referral in order to continue this. Patient denies any new neurological symptoms. He states that he does get rather bored at home. Before the stroke the patient was very active doing crafts. He returns today for an evaluation.  HISTORY(Dakota Hall):  Dakota Hall, 62-year-old male who comes to the office for first hospital follow up post hospital discharge for stroke. 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 He has vascular risk  factors of hypertension, hyperlipidemia, obstructive sleep apnea, diabetes and obesity.   In March, patient was hospitalized for sudden onset of right hemiparesis on 02/03/14. 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 He was given IV tPa. MRI showed a 2 x 2.5 cm region of acute infarction affecting the left basal ganglia and radiating white matter tracts. MRA was negative. 2D Echocardiogram EF 55-60% with no source of embolus. Carotid Doppler showed no evidence of hemodynamically significant internal carotid artery stenosis. Hbg A1c was 8.2, LDL 113. He was discharged to CIR. He was then admitted on 03/14/14 with shortness of breath, sats 74% on RA and found to have acute massive PE. He underwent catheter directed Pulmonary emboli lysis with TPA infusion with good results. Anticoagulation was started and he was discharged back to rehab.  He was noticed to have worsening slurred speech the morning of 04/22/14. He was taken to [facility]. Patient wife relates that she had noticed slurred speech intermittent over last 2 weeks prior, worse the morning of admission. Also she has notice difficulty finding words and he had been more sleepy. He had been complaining of metal taste. He was admitted to [facility] service for evalution of CVA. MRI and CT head negative for acute intracranial abnormalities. Appeared to be related to metabolic encephalopathy with worsening renal function and electrolytes disturbances. His encephalopathy resolved during the course of hospitalization.   Since that time he has returned home and is continuing to work on regaining strength. His INR has been stable and his blood pressure has been under fair control, it is 158/81 in the office   today. He states that he has been compliant with his cpap and trying to do what is needed to get well.   Update 01/17/2015 : He returns for follow-up after last visit 5 months ago. He is accompanied by his wife. Patient continues to  have significant residual right-sided weakness with practically no strength in his arm. He is able to walk with a hemiwalker but occasionally loses balance. He still getting home physical and occupational therapy. He cannot do activities that he enjoyed in the past leg doing crafts or yardwork. He remains on warfarin which is tolerating well without bleeding or bruising but his INR doesn't do fluctuate. His blood pressure is well controlled and in the 130s usually. Last A1c was 6. He does take baclofen 10 mg twice daily does complain of mild tiredness but has not tried to increase dose.  Update 03/24/2017 ; he returns for follow-up after last visit a year ago. He is accompanied by his wife. Patient can just do well and has not had any recurrent stroke or TIA symptoms since his last stroke in March 2015. He continued to have spastic right hemiplegia with practically no movement in the arm. He is able to walk but needs a hemiwalker. He walks only short distances inside his house has his right foot off and close inside. His had one or 2 near falls but no injuries. He remains on warfarin which is tolerating well with minor bruising and no bleeding. He did have a kidney stone in June 2017 which he passed and has done well since then. He states his sugars are doing well with last hemoglobin A1c being 6.1 checked a few months ago. He is tolerating Zocor well without muscle aches and pains and last lipid profile checked 6 months ago was also satisfactory. His blood pressure is well controlled at home though today it is slightly elevated at 146/73 in office. He has no new complaints. He has not had carotid ultrasound checked in a couple of years now  REVIEW OF SYSTEMS: Out of a complete 14 system review of symptoms, the patient complains only of the following symptoms, and all other reviewed systems are negative.  Choking, leg swelling, apnea, difficulty walking and all other systems negative  ALLERGIES: Allergies    Allergen Reactions  . Penicillins Hives    HOME MEDICATIONS: Outpatient Medications Prior to Visit  Medication Sig Dispense Refill  . acetaminophen (TYLENOL) 325 MG tablet Take 650 mg by mouth every 6 (six) hours as needed.    Marland Kitchen amLODipine (NORVASC) 10 MG tablet Take 10 mg by mouth daily.    . baclofen (LIORESAL) 20 MG tablet Take 1 tablet (20 mg total) by mouth 3 (three) times daily. (Patient taking differently: Take 20 mg by mouth 2 (two) times daily. ) 30 each 0  . Blood Glucose Monitoring Suppl (ONE TOUCH ULTRA 2) W/DEVICE KIT     . docusate sodium (COLACE) 100 MG capsule Take 100 mg by mouth daily as needed for mild constipation.    . isosorbide-hydrALAZINE (BIDIL) 20-37.5 MG per tablet Take 1 tablet by mouth 2 (two) times daily. 30 tablet 0  . ONE TOUCH ULTRA TEST test strip     . ONETOUCH DELICA LANCETS 61P MISC     . pioglitazone (ACTOS) 15 MG tablet Take 15 mg by mouth daily.    . sertraline (ZOLOFT) 50 MG tablet Take 5 tablets (250 mg total) by mouth at bedtime. 20 tablet 0  . simvastatin (ZOCOR) 20 MG tablet  Take 1 tablet (20 mg total) by mouth daily at 6 PM. 30 tablet 0  . Vitamin D, Ergocalciferol, (DRISDOL) 50000 UNITS CAPS capsule 50,000 Units every 7 (seven) days.     Marland Kitchen warfarin (COUMADIN) 2.5 MG tablet Take 2.5 mg by mouth daily. Take 2.5 every other day for 3 days,other days 5.0 for 4 days    . ARIPiprazole (ABILIFY) 5 MG tablet Take 2.5 mg by mouth daily.    . cefpodoxime (VANTIN) 100 MG tablet Take 1 tablet (100 mg total) by mouth 2 (two) times daily. (Patient not taking: Reported on 03/24/2017) 14 tablet 0  . morphine (MSIR) 15 MG tablet Take 1 tablet (15 mg total) by mouth every 4 (four) hours as needed for severe pain. (Patient not taking: Reported on 03/24/2017) 11 tablet 0  . ondansetron (ZOFRAN ODT) 4 MG disintegrating tablet '4mg'$  ODT q4 hours prn nausea/vomit (Patient not taking: Reported on 03/24/2017) 20 tablet 0  . pantoprazole (PROTONIX) 40 MG tablet Take 1 tablet  (40 mg total) by mouth at bedtime. (Patient not taking: Reported on 03/24/2017)    . tamsulosin (FLOMAX) 0.4 MG CAPS capsule Take 1 capsule (0.4 mg total) by mouth daily after supper. (Patient not taking: Reported on 03/24/2017) 30 capsule 0   No facility-administered medications prior to visit.     PAST MEDICAL HISTORY: Past Medical History:  Diagnosis Date  . Depression with anxiety   . Diabetes mellitus   . Hypertension   . Obesity   . OSA on CPAP   . Pulmonary emboli ([place]) 02/2014  . Stroke [facility]) July 2013   left paramedian pontine, incidental right parietal subcortical. both d/t small vessel disease  . Stroke [facility]) March 2015   left basal ganglia secondary to small vessel disease    PAST SURGICAL HISTORY: Past Surgical History:  Procedure Laterality Date  . ANAL FISSURE REPAIR    . TONSILLECTOMY      FAMILY HISTORY: Family History  Problem Relation Age of Onset  . Cancer Mother   . Heart disease Father     SOCIAL HISTORY: Social History   Social History  . Marital status: Married    Spouse name: Mickel Baas   . Number of children: 2  . Years of education: College    Occupational History  . disabled [employer]  . [employer]   Social History Main Topics  . Smoking status: Never Smoker  . Smokeless tobacco: Never Used  . Alcohol use No  . Drug use: No  . Sexual activity: Not on file   Other Topics Concern  . Not on file   Social History Narrative   Patient lives at home with wife Mickel Baas.    Patient has 2 children.    Patient is currently working.    Patient has a Degree.       PHYSICAL EXAM  Vitals:   03/24/17 1539  BP: (!) 146/73  Pulse: (!) 57  Weight: 246 lb (111.6 kg)  Height: '5\' 8"'$  (1.727 m)   Body mass index is 37.4 kg/m.  Generalized: Obese middle-age Caucasian male, in no acute distress   Neurological examination  Mentation: Alert oriented to time, placeAnd person. Follows all commands speech and language  fluent Cranial nerve II-XII: Pupils were equal round reactive to light. Extraocular movements were full, visual field were full on confrontational test. Facial sensation and strength were normal. Uvula tongue midline. Head turning and shoulder shrug  were normal and symmetric. Motor: The motor  testing reveals 5 over 5 strengthOn the left side but spastic right hemiplegia with fixed flexion contracture of the right wrist and fingers with 0/5 right upper extremity and 3/5 right lower extremity strength with right foot drop  Sensory: Sensory testing is intact to soft touch on all 4 extremities. No evidence of extinction is noted.  Coordination: Cerebellar testing reveals good finger-nose-finger and heel-to-shin bilaterally.  Gait and station: Gait unable to test as patient did not bring his hemiwalker.  Reflexes: Deep tendon reflexes are asymmetric and brisker on the right compared to the left DIAGNOSTIC DATA (LABS, IMAGING, TESTING) - I reviewed patient records, labs, notes, testing and imaging myself where available.   ASSESSMENT AND PLAN 66 y.o. year old male  has a past medical history of Depression with anxiety; Diabetes mellitus; Hypertension; Obesity; OSA on CPAP; Pulmonary emboli ([place]) (02/2014); Stroke [facility]) (July 2013); and Stroke [facility]) (March 2015). here with:  1. History of  Left Pontine stroke in 2013  and left basal ganglia and March 2015 and pulmonary embolism 2. Right-sided spastic hemiplegia    I had a long d/w patient about his recent stroke, risk for recurrent stroke/TIAs, personally independently reviewed imaging studies and stroke evaluation results and answered questions.Continue warfarin daily  for h/o pulmonary embolism for secondary stroke prevention and maintain strict control of hypertension with blood pressure goal below 130/90, diabetes with hemoglobin A1c goal below 6.5% and lipids with LDL cholesterol goal below 70 mg/dL. I also advised the patient to eat a healthy diet  with plenty of whole grains, cereals, fruits and vegetables, exercise regularly and maintain ideal body weight. We also discussed fall and safety precautions. Check screening carotid ultrasound study. Greater than 50% time during this 25 minute visit was spent on counseling and coordination of care about his poststroke spasticity, gait difficulty, falling and safety precautions and answering questions Since it has been more than 3 years since his last stroke I do not believe routine scheduled follow-up appointment with me is necessary. He may be referred back in the future by his primary care physician as needed.Antony Contras, MD 03/24/2017, 4:16 PM [facility] [address], [place], [place] [address] [phone]

## 2017-04-07 ENCOUNTER — Ambulatory Visit: Payer: Medicare HMO | Admitting: Sports Medicine

## 2017-04-10 DIAGNOSIS — Z7901 Long term (current) use of anticoagulants: Secondary | ICD-10-CM | POA: Diagnosis not present

## 2017-04-20 ENCOUNTER — Encounter (HOSPITAL_COMMUNITY): Payer: Managed Care, Other (non HMO)

## 2017-04-23 ENCOUNTER — Telehealth: Payer: Self-pay | Admitting: Neurology

## 2017-04-23 ENCOUNTER — Other Ambulatory Visit: Payer: Self-pay | Admitting: Neurology

## 2017-04-23 DIAGNOSIS — G8110 Spastic hemiplegia affecting unspecified side: Secondary | ICD-10-CM

## 2017-04-23 NOTE — Telephone Encounter (Signed)
Lupita LeashDonna , [facility] . Lupita LeashDonna and I scheduled patient for May 11 th arrive at 1:45 . Patient is aware.  I spoke to patient. With details.

## 2017-04-23 NOTE — Telephone Encounter (Addendum)
Called and [facility] to to [facility]. Patient's order is in as VAS US  Carotid .   Order needs to be changed to [id number]. Please  Let me know when you order and I will call to schedule . Thanks Annabelle Harmanana.

## 2017-04-23 NOTE — Telephone Encounter (Signed)
Patient called and requested to speak with someone regarding a carotid he was supposed to have scheduled. I do not see the order in the system? He stated that Dr. Pearlean BrownieSethi told him the hospital would call to schedule but he hasn't heard anything. Please advise.

## 2017-04-23 NOTE — Telephone Encounter (Signed)
[facility]

## 2017-04-23 NOTE — Telephone Encounter (Addendum)
Order correct in system. Order was put in May 8 by Dr.Sethi. Order printed out from 03/24/2017 and [facility] attention Lupita LeashDonna.

## 2017-04-27 ENCOUNTER — Ambulatory Visit (HOSPITAL_COMMUNITY): Payer: Medicare HMO

## 2017-05-01 ENCOUNTER — Ambulatory Visit (HOSPITAL_COMMUNITY)
Admission: RE | Admit: 2017-05-01 | Discharge: 2017-05-01 | Disposition: A | Discharge status: Home / Self Care | Payer: Medicare HMO | Source: Ambulatory Visit | Attending: Neurology | Admitting: Neurology

## 2017-05-01 DIAGNOSIS — I6523 Occlusion and stenosis of bilateral carotid arteries: Secondary | ICD-10-CM | POA: Insufficient documentation

## 2017-05-01 DIAGNOSIS — G8110 Spastic hemiplegia affecting unspecified side: Secondary | ICD-10-CM

## 2017-05-01 LAB — VAS US CAROTID
LEFT ECA DIAS: -9 cm/s
LEFT VERTEBRAL DIAS: 14 cm/s
LICADSYS: -65 cm/s
Left CCA dist dias: 10 cm/s
Left CCA dist sys: 67 cm/s
Left CCA prox dias: -25 cm/s
Left CCA prox sys: -249 cm/s
Left ICA dist dias: -17 cm/s
Left ICA prox dias: -19 cm/s
Left ICA prox sys: -64 cm/s
RCCAPDIAS: -8 cm/s
RIGHT ECA DIAS: -9 cm/s
RIGHT VERTEBRAL DIAS: -10 cm/s
Right CCA prox sys: -85 cm/s
Right cca dist sys: -69 cm/s

## 2017-05-01 NOTE — Progress Notes (Signed)
VASCULAR LAB PRELIMINARY  PRELIMINARY  PRELIMINARY  PRELIMINARY  Carotid duplex completed.    Preliminary report:  Bilateral:  1-39% ICA stenosis.  Vertebral artery flow is antegrade.     Dakota Hall, RVS 05/01/2017, 5:09 PM

## 2017-05-08 DIAGNOSIS — Z7901 Long term (current) use of anticoagulants: Secondary | ICD-10-CM | POA: Diagnosis not present

## 2017-05-21 ENCOUNTER — Telehealth: Payer: Self-pay

## 2017-05-21 NOTE — Telephone Encounter (Signed)
Left 2nd vm for patient to call back for carotid doppler results.

## 2017-05-21 NOTE — Telephone Encounter (Signed)
LEft vm for patient to call back about carotid doppler results.

## 2017-05-21 NOTE — Telephone Encounter (Signed)
-----   Message from Micki RileyPramod S Sethi, MD sent at 05/20/2017 11:21 AM EDT ----- Joneen RoachKindly inform patient that carotid doppler study was unremarkable

## 2017-05-22 DIAGNOSIS — S81801A Unspecified open wound, right lower leg, initial encounter: Secondary | ICD-10-CM | POA: Diagnosis not present

## 2017-05-22 DIAGNOSIS — M7989 Other specified soft tissue disorders: Secondary | ICD-10-CM | POA: Diagnosis not present

## 2017-05-22 DIAGNOSIS — R252 Cramp and spasm: Secondary | ICD-10-CM | POA: Diagnosis not present

## 2017-05-22 NOTE — Telephone Encounter (Signed)
Pt called back, he was made aware of carotid doppler study was unremarkable, [facility]

## 2017-06-05 DIAGNOSIS — Z7901 Long term (current) use of anticoagulants: Secondary | ICD-10-CM | POA: Diagnosis not present

## 2017-06-11 DIAGNOSIS — Z7901 Long term (current) use of anticoagulants: Secondary | ICD-10-CM | POA: Diagnosis not present

## 2017-06-11 DIAGNOSIS — F321 Major depressive disorder, single episode, moderate: Secondary | ICD-10-CM | POA: Diagnosis not present

## 2017-06-23 DIAGNOSIS — Z7901 Long term (current) use of anticoagulants: Secondary | ICD-10-CM | POA: Diagnosis not present

## 2017-07-24 DIAGNOSIS — E1122 Type 2 diabetes mellitus with diabetic chronic kidney disease: Secondary | ICD-10-CM | POA: Diagnosis not present

## 2017-07-24 DIAGNOSIS — E785 Hyperlipidemia, unspecified: Secondary | ICD-10-CM | POA: Diagnosis not present

## 2017-07-24 DIAGNOSIS — N183 Chronic kidney disease, stage 3 (moderate): Secondary | ICD-10-CM | POA: Diagnosis not present

## 2017-07-24 DIAGNOSIS — I87303 Chronic venous hypertension (idiopathic) without complications of bilateral lower extremity: Secondary | ICD-10-CM | POA: Diagnosis not present

## 2017-07-24 DIAGNOSIS — I69322 Dysarthria following cerebral infarction: Secondary | ICD-10-CM | POA: Diagnosis not present

## 2017-07-24 DIAGNOSIS — I10 Essential (primary) hypertension: Secondary | ICD-10-CM | POA: Diagnosis not present

## 2017-07-24 DIAGNOSIS — I69151 Hemiplegia and hemiparesis following nontraumatic intracerebral hemorrhage affecting right dominant side: Secondary | ICD-10-CM | POA: Diagnosis not present

## 2017-07-24 DIAGNOSIS — Z7901 Long term (current) use of anticoagulants: Secondary | ICD-10-CM | POA: Diagnosis not present

## 2017-07-24 DIAGNOSIS — Z23 Encounter for immunization: Secondary | ICD-10-CM | POA: Diagnosis not present

## 2017-08-21 DIAGNOSIS — Z7901 Long term (current) use of anticoagulants: Secondary | ICD-10-CM | POA: Diagnosis not present

## 2017-09-18 DIAGNOSIS — Z7901 Long term (current) use of anticoagulants: Secondary | ICD-10-CM | POA: Diagnosis not present

## 2017-10-02 DIAGNOSIS — Z7901 Long term (current) use of anticoagulants: Secondary | ICD-10-CM | POA: Diagnosis not present

## 2017-10-30 DIAGNOSIS — Z7901 Long term (current) use of anticoagulants: Secondary | ICD-10-CM | POA: Diagnosis not present

## 2017-11-06 DIAGNOSIS — E119 Type 2 diabetes mellitus without complications: Secondary | ICD-10-CM | POA: Diagnosis not present

## 2017-11-06 DIAGNOSIS — H524 Presbyopia: Secondary | ICD-10-CM | POA: Diagnosis not present

## 2017-11-09 DIAGNOSIS — I10 Essential (primary) hypertension: Secondary | ICD-10-CM | POA: Diagnosis not present

## 2017-11-09 DIAGNOSIS — R600 Localized edema: Secondary | ICD-10-CM | POA: Diagnosis not present

## 2017-11-09 DIAGNOSIS — L03115 Cellulitis of right lower limb: Secondary | ICD-10-CM | POA: Diagnosis not present

## 2017-11-20 DIAGNOSIS — Z7901 Long term (current) use of anticoagulants: Secondary | ICD-10-CM | POA: Diagnosis not present

## 2017-11-20 DIAGNOSIS — R609 Edema, unspecified: Secondary | ICD-10-CM | POA: Diagnosis not present

## 2017-11-20 DIAGNOSIS — I69151 Hemiplegia and hemiparesis following nontraumatic intracerebral hemorrhage affecting right dominant side: Secondary | ICD-10-CM | POA: Diagnosis not present

## 2017-11-20 DIAGNOSIS — L03115 Cellulitis of right lower limb: Secondary | ICD-10-CM | POA: Diagnosis not present

## 2017-11-20 DIAGNOSIS — R600 Localized edema: Secondary | ICD-10-CM | POA: Diagnosis not present

## 2017-11-20 DIAGNOSIS — L97909 Non-pressure chronic ulcer of unspecified part of unspecified lower leg with unspecified severity: Secondary | ICD-10-CM | POA: Diagnosis not present

## 2017-11-23 ENCOUNTER — Telehealth: Payer: Self-pay | Admitting: Neurology

## 2017-11-23 NOTE — Telephone Encounter (Signed)
Dakota Hall @ [facility] has called re: orders needed for the echo cardiogram for pt, she is asking for a call at [phone], if she does not pick up she is asking that a message be left for her

## 2017-11-24 ENCOUNTER — Other Ambulatory Visit: Payer: Self-pay | Admitting: Internal Medicine

## 2017-11-24 ENCOUNTER — Other Ambulatory Visit (HOSPITAL_COMMUNITY): Payer: Medicare HMO

## 2017-11-24 DIAGNOSIS — R609 Edema, unspecified: Secondary | ICD-10-CM

## 2017-11-24 NOTE — Telephone Encounter (Signed)
Called Chasity back about left her a message asking her to call me back.

## 2017-12-01 ENCOUNTER — Other Ambulatory Visit: Payer: Self-pay

## 2017-12-01 ENCOUNTER — Ambulatory Visit (HOSPITAL_COMMUNITY): Payer: Medicare HMO | Attending: Cardiology

## 2017-12-01 DIAGNOSIS — Z8673 Personal history of transient ischemic attack (TIA), and cerebral infarction without residual deficits: Secondary | ICD-10-CM | POA: Diagnosis not present

## 2017-12-01 DIAGNOSIS — I119 Hypertensive heart disease without heart failure: Secondary | ICD-10-CM | POA: Diagnosis not present

## 2017-12-01 DIAGNOSIS — G4733 Obstructive sleep apnea (adult) (pediatric): Secondary | ICD-10-CM | POA: Insufficient documentation

## 2017-12-01 DIAGNOSIS — E785 Hyperlipidemia, unspecified: Secondary | ICD-10-CM | POA: Insufficient documentation

## 2017-12-01 DIAGNOSIS — E119 Type 2 diabetes mellitus without complications: Secondary | ICD-10-CM | POA: Diagnosis not present

## 2017-12-01 DIAGNOSIS — R609 Edema, unspecified: Secondary | ICD-10-CM

## 2017-12-11 DIAGNOSIS — Z7901 Long term (current) use of anticoagulants: Secondary | ICD-10-CM | POA: Diagnosis not present

## 2017-12-29 DIAGNOSIS — I10 Essential (primary) hypertension: Secondary | ICD-10-CM | POA: Diagnosis not present

## 2017-12-29 DIAGNOSIS — L97221 Non-pressure chronic ulcer of left calf limited to breakdown of skin: Secondary | ICD-10-CM | POA: Diagnosis not present

## 2017-12-29 DIAGNOSIS — I69351 Hemiplegia and hemiparesis following cerebral infarction affecting right dominant side: Secondary | ICD-10-CM | POA: Diagnosis not present

## 2017-12-29 DIAGNOSIS — Z7984 Long term (current) use of oral hypoglycemic drugs: Secondary | ICD-10-CM | POA: Diagnosis not present

## 2017-12-29 DIAGNOSIS — L97211 Non-pressure chronic ulcer of right calf limited to breakdown of skin: Secondary | ICD-10-CM | POA: Diagnosis not present

## 2017-12-29 DIAGNOSIS — E11622 Type 2 diabetes mellitus with other skin ulcer: Secondary | ICD-10-CM | POA: Diagnosis not present

## 2017-12-29 DIAGNOSIS — F329 Major depressive disorder, single episode, unspecified: Secondary | ICD-10-CM | POA: Diagnosis not present

## 2017-12-29 DIAGNOSIS — E1151 Type 2 diabetes mellitus with diabetic peripheral angiopathy without gangrene: Secondary | ICD-10-CM | POA: Diagnosis not present

## 2017-12-29 DIAGNOSIS — Z7901 Long term (current) use of anticoagulants: Secondary | ICD-10-CM | POA: Diagnosis not present

## 2017-12-31 DIAGNOSIS — L97211 Non-pressure chronic ulcer of right calf limited to breakdown of skin: Secondary | ICD-10-CM | POA: Diagnosis not present

## 2017-12-31 DIAGNOSIS — I10 Essential (primary) hypertension: Secondary | ICD-10-CM | POA: Diagnosis not present

## 2017-12-31 DIAGNOSIS — Z7901 Long term (current) use of anticoagulants: Secondary | ICD-10-CM | POA: Diagnosis not present

## 2017-12-31 DIAGNOSIS — I69351 Hemiplegia and hemiparesis following cerebral infarction affecting right dominant side: Secondary | ICD-10-CM | POA: Diagnosis not present

## 2017-12-31 DIAGNOSIS — E11622 Type 2 diabetes mellitus with other skin ulcer: Secondary | ICD-10-CM | POA: Diagnosis not present

## 2017-12-31 DIAGNOSIS — E1151 Type 2 diabetes mellitus with diabetic peripheral angiopathy without gangrene: Secondary | ICD-10-CM | POA: Diagnosis not present

## 2017-12-31 DIAGNOSIS — L97221 Non-pressure chronic ulcer of left calf limited to breakdown of skin: Secondary | ICD-10-CM | POA: Diagnosis not present

## 2017-12-31 DIAGNOSIS — F329 Major depressive disorder, single episode, unspecified: Secondary | ICD-10-CM | POA: Diagnosis not present

## 2017-12-31 DIAGNOSIS — Z7984 Long term (current) use of oral hypoglycemic drugs: Secondary | ICD-10-CM | POA: Diagnosis not present

## 2018-01-04 DIAGNOSIS — I69351 Hemiplegia and hemiparesis following cerebral infarction affecting right dominant side: Secondary | ICD-10-CM | POA: Diagnosis not present

## 2018-01-04 DIAGNOSIS — Z7984 Long term (current) use of oral hypoglycemic drugs: Secondary | ICD-10-CM | POA: Diagnosis not present

## 2018-01-04 DIAGNOSIS — E11622 Type 2 diabetes mellitus with other skin ulcer: Secondary | ICD-10-CM | POA: Diagnosis not present

## 2018-01-04 DIAGNOSIS — F329 Major depressive disorder, single episode, unspecified: Secondary | ICD-10-CM | POA: Diagnosis not present

## 2018-01-04 DIAGNOSIS — E1151 Type 2 diabetes mellitus with diabetic peripheral angiopathy without gangrene: Secondary | ICD-10-CM | POA: Diagnosis not present

## 2018-01-04 DIAGNOSIS — L97211 Non-pressure chronic ulcer of right calf limited to breakdown of skin: Secondary | ICD-10-CM | POA: Diagnosis not present

## 2018-01-04 DIAGNOSIS — I10 Essential (primary) hypertension: Secondary | ICD-10-CM | POA: Diagnosis not present

## 2018-01-04 DIAGNOSIS — L97221 Non-pressure chronic ulcer of left calf limited to breakdown of skin: Secondary | ICD-10-CM | POA: Diagnosis not present

## 2018-01-04 DIAGNOSIS — Z7901 Long term (current) use of anticoagulants: Secondary | ICD-10-CM | POA: Diagnosis not present

## 2018-01-06 DIAGNOSIS — Z7984 Long term (current) use of oral hypoglycemic drugs: Secondary | ICD-10-CM | POA: Diagnosis not present

## 2018-01-06 DIAGNOSIS — L97221 Non-pressure chronic ulcer of left calf limited to breakdown of skin: Secondary | ICD-10-CM | POA: Diagnosis not present

## 2018-01-06 DIAGNOSIS — E1151 Type 2 diabetes mellitus with diabetic peripheral angiopathy without gangrene: Secondary | ICD-10-CM | POA: Diagnosis not present

## 2018-01-06 DIAGNOSIS — I69351 Hemiplegia and hemiparesis following cerebral infarction affecting right dominant side: Secondary | ICD-10-CM | POA: Diagnosis not present

## 2018-01-06 DIAGNOSIS — I10 Essential (primary) hypertension: Secondary | ICD-10-CM | POA: Diagnosis not present

## 2018-01-06 DIAGNOSIS — E11622 Type 2 diabetes mellitus with other skin ulcer: Secondary | ICD-10-CM | POA: Diagnosis not present

## 2018-01-06 DIAGNOSIS — F329 Major depressive disorder, single episode, unspecified: Secondary | ICD-10-CM | POA: Diagnosis not present

## 2018-01-06 DIAGNOSIS — Z7901 Long term (current) use of anticoagulants: Secondary | ICD-10-CM | POA: Diagnosis not present

## 2018-01-06 DIAGNOSIS — L97211 Non-pressure chronic ulcer of right calf limited to breakdown of skin: Secondary | ICD-10-CM | POA: Diagnosis not present

## 2018-01-07 DIAGNOSIS — F329 Major depressive disorder, single episode, unspecified: Secondary | ICD-10-CM | POA: Diagnosis not present

## 2018-01-07 DIAGNOSIS — I69351 Hemiplegia and hemiparesis following cerebral infarction affecting right dominant side: Secondary | ICD-10-CM | POA: Diagnosis not present

## 2018-01-07 DIAGNOSIS — E11622 Type 2 diabetes mellitus with other skin ulcer: Secondary | ICD-10-CM | POA: Diagnosis not present

## 2018-01-07 DIAGNOSIS — Z7901 Long term (current) use of anticoagulants: Secondary | ICD-10-CM | POA: Diagnosis not present

## 2018-01-07 DIAGNOSIS — I10 Essential (primary) hypertension: Secondary | ICD-10-CM | POA: Diagnosis not present

## 2018-01-07 DIAGNOSIS — Z7984 Long term (current) use of oral hypoglycemic drugs: Secondary | ICD-10-CM | POA: Diagnosis not present

## 2018-01-07 DIAGNOSIS — L97221 Non-pressure chronic ulcer of left calf limited to breakdown of skin: Secondary | ICD-10-CM | POA: Diagnosis not present

## 2018-01-07 DIAGNOSIS — L97211 Non-pressure chronic ulcer of right calf limited to breakdown of skin: Secondary | ICD-10-CM | POA: Diagnosis not present

## 2018-01-07 DIAGNOSIS — E1151 Type 2 diabetes mellitus with diabetic peripheral angiopathy without gangrene: Secondary | ICD-10-CM | POA: Diagnosis not present

## 2018-01-08 DIAGNOSIS — Z7901 Long term (current) use of anticoagulants: Secondary | ICD-10-CM | POA: Diagnosis not present

## 2018-01-08 DIAGNOSIS — E1151 Type 2 diabetes mellitus with diabetic peripheral angiopathy without gangrene: Secondary | ICD-10-CM | POA: Diagnosis not present

## 2018-01-08 DIAGNOSIS — F329 Major depressive disorder, single episode, unspecified: Secondary | ICD-10-CM | POA: Diagnosis not present

## 2018-01-08 DIAGNOSIS — I69351 Hemiplegia and hemiparesis following cerebral infarction affecting right dominant side: Secondary | ICD-10-CM | POA: Diagnosis not present

## 2018-01-08 DIAGNOSIS — I10 Essential (primary) hypertension: Secondary | ICD-10-CM | POA: Diagnosis not present

## 2018-01-08 DIAGNOSIS — E11622 Type 2 diabetes mellitus with other skin ulcer: Secondary | ICD-10-CM | POA: Diagnosis not present

## 2018-01-08 DIAGNOSIS — L97211 Non-pressure chronic ulcer of right calf limited to breakdown of skin: Secondary | ICD-10-CM | POA: Diagnosis not present

## 2018-01-08 DIAGNOSIS — L97221 Non-pressure chronic ulcer of left calf limited to breakdown of skin: Secondary | ICD-10-CM | POA: Diagnosis not present

## 2018-01-08 DIAGNOSIS — Z7984 Long term (current) use of oral hypoglycemic drugs: Secondary | ICD-10-CM | POA: Diagnosis not present

## 2018-01-12 DIAGNOSIS — F329 Major depressive disorder, single episode, unspecified: Secondary | ICD-10-CM | POA: Diagnosis not present

## 2018-01-12 DIAGNOSIS — I10 Essential (primary) hypertension: Secondary | ICD-10-CM | POA: Diagnosis not present

## 2018-01-12 DIAGNOSIS — Z7984 Long term (current) use of oral hypoglycemic drugs: Secondary | ICD-10-CM | POA: Diagnosis not present

## 2018-01-12 DIAGNOSIS — Z7901 Long term (current) use of anticoagulants: Secondary | ICD-10-CM | POA: Diagnosis not present

## 2018-01-12 DIAGNOSIS — E11622 Type 2 diabetes mellitus with other skin ulcer: Secondary | ICD-10-CM | POA: Diagnosis not present

## 2018-01-12 DIAGNOSIS — L97211 Non-pressure chronic ulcer of right calf limited to breakdown of skin: Secondary | ICD-10-CM | POA: Diagnosis not present

## 2018-01-12 DIAGNOSIS — I69351 Hemiplegia and hemiparesis following cerebral infarction affecting right dominant side: Secondary | ICD-10-CM | POA: Diagnosis not present

## 2018-01-12 DIAGNOSIS — L97221 Non-pressure chronic ulcer of left calf limited to breakdown of skin: Secondary | ICD-10-CM | POA: Diagnosis not present

## 2018-01-12 DIAGNOSIS — E1151 Type 2 diabetes mellitus with diabetic peripheral angiopathy without gangrene: Secondary | ICD-10-CM | POA: Diagnosis not present

## 2018-01-13 DIAGNOSIS — L97221 Non-pressure chronic ulcer of left calf limited to breakdown of skin: Secondary | ICD-10-CM | POA: Diagnosis not present

## 2018-01-13 DIAGNOSIS — I10 Essential (primary) hypertension: Secondary | ICD-10-CM | POA: Diagnosis not present

## 2018-01-13 DIAGNOSIS — E1151 Type 2 diabetes mellitus with diabetic peripheral angiopathy without gangrene: Secondary | ICD-10-CM | POA: Diagnosis not present

## 2018-01-13 DIAGNOSIS — I69351 Hemiplegia and hemiparesis following cerebral infarction affecting right dominant side: Secondary | ICD-10-CM | POA: Diagnosis not present

## 2018-01-13 DIAGNOSIS — L97211 Non-pressure chronic ulcer of right calf limited to breakdown of skin: Secondary | ICD-10-CM | POA: Diagnosis not present

## 2018-01-13 DIAGNOSIS — Z7984 Long term (current) use of oral hypoglycemic drugs: Secondary | ICD-10-CM | POA: Diagnosis not present

## 2018-01-13 DIAGNOSIS — Z7901 Long term (current) use of anticoagulants: Secondary | ICD-10-CM | POA: Diagnosis not present

## 2018-01-13 DIAGNOSIS — F329 Major depressive disorder, single episode, unspecified: Secondary | ICD-10-CM | POA: Diagnosis not present

## 2018-01-13 DIAGNOSIS — E11622 Type 2 diabetes mellitus with other skin ulcer: Secondary | ICD-10-CM | POA: Diagnosis not present

## 2018-01-14 DIAGNOSIS — Z7901 Long term (current) use of anticoagulants: Secondary | ICD-10-CM | POA: Diagnosis not present

## 2018-01-14 DIAGNOSIS — L97211 Non-pressure chronic ulcer of right calf limited to breakdown of skin: Secondary | ICD-10-CM | POA: Diagnosis not present

## 2018-01-14 DIAGNOSIS — L97221 Non-pressure chronic ulcer of left calf limited to breakdown of skin: Secondary | ICD-10-CM | POA: Diagnosis not present

## 2018-01-14 DIAGNOSIS — I10 Essential (primary) hypertension: Secondary | ICD-10-CM | POA: Diagnosis not present

## 2018-01-14 DIAGNOSIS — E1151 Type 2 diabetes mellitus with diabetic peripheral angiopathy without gangrene: Secondary | ICD-10-CM | POA: Diagnosis not present

## 2018-01-14 DIAGNOSIS — Z7984 Long term (current) use of oral hypoglycemic drugs: Secondary | ICD-10-CM | POA: Diagnosis not present

## 2018-01-14 DIAGNOSIS — F329 Major depressive disorder, single episode, unspecified: Secondary | ICD-10-CM | POA: Diagnosis not present

## 2018-01-14 DIAGNOSIS — E11622 Type 2 diabetes mellitus with other skin ulcer: Secondary | ICD-10-CM | POA: Diagnosis not present

## 2018-01-14 DIAGNOSIS — I69351 Hemiplegia and hemiparesis following cerebral infarction affecting right dominant side: Secondary | ICD-10-CM | POA: Diagnosis not present

## 2018-01-15 DIAGNOSIS — L97211 Non-pressure chronic ulcer of right calf limited to breakdown of skin: Secondary | ICD-10-CM | POA: Diagnosis not present

## 2018-01-15 DIAGNOSIS — Z7901 Long term (current) use of anticoagulants: Secondary | ICD-10-CM | POA: Diagnosis not present

## 2018-01-15 DIAGNOSIS — E11622 Type 2 diabetes mellitus with other skin ulcer: Secondary | ICD-10-CM | POA: Diagnosis not present

## 2018-01-15 DIAGNOSIS — Z7984 Long term (current) use of oral hypoglycemic drugs: Secondary | ICD-10-CM | POA: Diagnosis not present

## 2018-01-15 DIAGNOSIS — I69351 Hemiplegia and hemiparesis following cerebral infarction affecting right dominant side: Secondary | ICD-10-CM | POA: Diagnosis not present

## 2018-01-15 DIAGNOSIS — I10 Essential (primary) hypertension: Secondary | ICD-10-CM | POA: Diagnosis not present

## 2018-01-15 DIAGNOSIS — E1151 Type 2 diabetes mellitus with diabetic peripheral angiopathy without gangrene: Secondary | ICD-10-CM | POA: Diagnosis not present

## 2018-01-15 DIAGNOSIS — F329 Major depressive disorder, single episode, unspecified: Secondary | ICD-10-CM | POA: Diagnosis not present

## 2018-01-15 DIAGNOSIS — L97221 Non-pressure chronic ulcer of left calf limited to breakdown of skin: Secondary | ICD-10-CM | POA: Diagnosis not present

## 2018-01-18 DIAGNOSIS — L97221 Non-pressure chronic ulcer of left calf limited to breakdown of skin: Secondary | ICD-10-CM | POA: Diagnosis not present

## 2018-01-18 DIAGNOSIS — I69351 Hemiplegia and hemiparesis following cerebral infarction affecting right dominant side: Secondary | ICD-10-CM | POA: Diagnosis not present

## 2018-01-18 DIAGNOSIS — E11622 Type 2 diabetes mellitus with other skin ulcer: Secondary | ICD-10-CM | POA: Diagnosis not present

## 2018-01-18 DIAGNOSIS — F329 Major depressive disorder, single episode, unspecified: Secondary | ICD-10-CM | POA: Diagnosis not present

## 2018-01-18 DIAGNOSIS — E1151 Type 2 diabetes mellitus with diabetic peripheral angiopathy without gangrene: Secondary | ICD-10-CM | POA: Diagnosis not present

## 2018-01-18 DIAGNOSIS — L97211 Non-pressure chronic ulcer of right calf limited to breakdown of skin: Secondary | ICD-10-CM | POA: Diagnosis not present

## 2018-01-18 DIAGNOSIS — I10 Essential (primary) hypertension: Secondary | ICD-10-CM | POA: Diagnosis not present

## 2018-01-18 DIAGNOSIS — Z7984 Long term (current) use of oral hypoglycemic drugs: Secondary | ICD-10-CM | POA: Diagnosis not present

## 2018-01-18 DIAGNOSIS — Z7901 Long term (current) use of anticoagulants: Secondary | ICD-10-CM | POA: Diagnosis not present

## 2018-01-19 DIAGNOSIS — Z7984 Long term (current) use of oral hypoglycemic drugs: Secondary | ICD-10-CM | POA: Diagnosis not present

## 2018-01-19 DIAGNOSIS — L97211 Non-pressure chronic ulcer of right calf limited to breakdown of skin: Secondary | ICD-10-CM | POA: Diagnosis not present

## 2018-01-19 DIAGNOSIS — L97221 Non-pressure chronic ulcer of left calf limited to breakdown of skin: Secondary | ICD-10-CM | POA: Diagnosis not present

## 2018-01-19 DIAGNOSIS — E11622 Type 2 diabetes mellitus with other skin ulcer: Secondary | ICD-10-CM | POA: Diagnosis not present

## 2018-01-19 DIAGNOSIS — I10 Essential (primary) hypertension: Secondary | ICD-10-CM | POA: Diagnosis not present

## 2018-01-19 DIAGNOSIS — F329 Major depressive disorder, single episode, unspecified: Secondary | ICD-10-CM | POA: Diagnosis not present

## 2018-01-19 DIAGNOSIS — Z7901 Long term (current) use of anticoagulants: Secondary | ICD-10-CM | POA: Diagnosis not present

## 2018-01-19 DIAGNOSIS — I69351 Hemiplegia and hemiparesis following cerebral infarction affecting right dominant side: Secondary | ICD-10-CM | POA: Diagnosis not present

## 2018-01-19 DIAGNOSIS — E1151 Type 2 diabetes mellitus with diabetic peripheral angiopathy without gangrene: Secondary | ICD-10-CM | POA: Diagnosis not present

## 2018-01-21 DIAGNOSIS — I10 Essential (primary) hypertension: Secondary | ICD-10-CM | POA: Diagnosis not present

## 2018-01-21 DIAGNOSIS — I69351 Hemiplegia and hemiparesis following cerebral infarction affecting right dominant side: Secondary | ICD-10-CM | POA: Diagnosis not present

## 2018-01-21 DIAGNOSIS — E11622 Type 2 diabetes mellitus with other skin ulcer: Secondary | ICD-10-CM | POA: Diagnosis not present

## 2018-01-21 DIAGNOSIS — Z7901 Long term (current) use of anticoagulants: Secondary | ICD-10-CM | POA: Diagnosis not present

## 2018-01-21 DIAGNOSIS — L97221 Non-pressure chronic ulcer of left calf limited to breakdown of skin: Secondary | ICD-10-CM | POA: Diagnosis not present

## 2018-01-21 DIAGNOSIS — Z7984 Long term (current) use of oral hypoglycemic drugs: Secondary | ICD-10-CM | POA: Diagnosis not present

## 2018-01-21 DIAGNOSIS — E1151 Type 2 diabetes mellitus with diabetic peripheral angiopathy without gangrene: Secondary | ICD-10-CM | POA: Diagnosis not present

## 2018-01-21 DIAGNOSIS — F329 Major depressive disorder, single episode, unspecified: Secondary | ICD-10-CM | POA: Diagnosis not present

## 2018-01-21 DIAGNOSIS — L97211 Non-pressure chronic ulcer of right calf limited to breakdown of skin: Secondary | ICD-10-CM | POA: Diagnosis not present

## 2018-01-22 DIAGNOSIS — E1151 Type 2 diabetes mellitus with diabetic peripheral angiopathy without gangrene: Secondary | ICD-10-CM | POA: Diagnosis not present

## 2018-01-22 DIAGNOSIS — L97221 Non-pressure chronic ulcer of left calf limited to breakdown of skin: Secondary | ICD-10-CM | POA: Diagnosis not present

## 2018-01-22 DIAGNOSIS — Z7901 Long term (current) use of anticoagulants: Secondary | ICD-10-CM | POA: Diagnosis not present

## 2018-01-22 DIAGNOSIS — I69351 Hemiplegia and hemiparesis following cerebral infarction affecting right dominant side: Secondary | ICD-10-CM | POA: Diagnosis not present

## 2018-01-22 DIAGNOSIS — L97211 Non-pressure chronic ulcer of right calf limited to breakdown of skin: Secondary | ICD-10-CM | POA: Diagnosis not present

## 2018-01-22 DIAGNOSIS — I10 Essential (primary) hypertension: Secondary | ICD-10-CM | POA: Diagnosis not present

## 2018-01-22 DIAGNOSIS — Z7984 Long term (current) use of oral hypoglycemic drugs: Secondary | ICD-10-CM | POA: Diagnosis not present

## 2018-01-22 DIAGNOSIS — E11622 Type 2 diabetes mellitus with other skin ulcer: Secondary | ICD-10-CM | POA: Diagnosis not present

## 2018-01-22 DIAGNOSIS — I69359 Hemiplegia and hemiparesis following cerebral infarction affecting unspecified side: Secondary | ICD-10-CM | POA: Diagnosis not present

## 2018-01-22 DIAGNOSIS — F329 Major depressive disorder, single episode, unspecified: Secondary | ICD-10-CM | POA: Diagnosis not present

## 2018-01-22 DIAGNOSIS — G9340 Encephalopathy, unspecified: Secondary | ICD-10-CM | POA: Diagnosis not present

## 2018-01-22 DIAGNOSIS — I6789 Other cerebrovascular disease: Secondary | ICD-10-CM | POA: Diagnosis not present

## 2018-01-22 DIAGNOSIS — E119 Type 2 diabetes mellitus without complications: Secondary | ICD-10-CM | POA: Diagnosis not present

## 2018-01-29 DIAGNOSIS — I69351 Hemiplegia and hemiparesis following cerebral infarction affecting right dominant side: Secondary | ICD-10-CM | POA: Diagnosis not present

## 2018-01-29 DIAGNOSIS — L97221 Non-pressure chronic ulcer of left calf limited to breakdown of skin: Secondary | ICD-10-CM | POA: Diagnosis not present

## 2018-01-29 DIAGNOSIS — Z7901 Long term (current) use of anticoagulants: Secondary | ICD-10-CM | POA: Diagnosis not present

## 2018-01-29 DIAGNOSIS — E1151 Type 2 diabetes mellitus with diabetic peripheral angiopathy without gangrene: Secondary | ICD-10-CM | POA: Diagnosis not present

## 2018-01-29 DIAGNOSIS — Z7984 Long term (current) use of oral hypoglycemic drugs: Secondary | ICD-10-CM | POA: Diagnosis not present

## 2018-01-29 DIAGNOSIS — L97211 Non-pressure chronic ulcer of right calf limited to breakdown of skin: Secondary | ICD-10-CM | POA: Diagnosis not present

## 2018-01-29 DIAGNOSIS — I10 Essential (primary) hypertension: Secondary | ICD-10-CM | POA: Diagnosis not present

## 2018-01-29 DIAGNOSIS — E11622 Type 2 diabetes mellitus with other skin ulcer: Secondary | ICD-10-CM | POA: Diagnosis not present

## 2018-01-29 DIAGNOSIS — F329 Major depressive disorder, single episode, unspecified: Secondary | ICD-10-CM | POA: Diagnosis not present

## 2018-02-03 DIAGNOSIS — Z7901 Long term (current) use of anticoagulants: Secondary | ICD-10-CM | POA: Diagnosis not present

## 2018-02-03 DIAGNOSIS — I69351 Hemiplegia and hemiparesis following cerebral infarction affecting right dominant side: Secondary | ICD-10-CM | POA: Diagnosis not present

## 2018-02-03 DIAGNOSIS — F329 Major depressive disorder, single episode, unspecified: Secondary | ICD-10-CM | POA: Diagnosis not present

## 2018-02-03 DIAGNOSIS — L97211 Non-pressure chronic ulcer of right calf limited to breakdown of skin: Secondary | ICD-10-CM | POA: Diagnosis not present

## 2018-02-03 DIAGNOSIS — E1151 Type 2 diabetes mellitus with diabetic peripheral angiopathy without gangrene: Secondary | ICD-10-CM | POA: Diagnosis not present

## 2018-02-03 DIAGNOSIS — I10 Essential (primary) hypertension: Secondary | ICD-10-CM | POA: Diagnosis not present

## 2018-02-03 DIAGNOSIS — E11622 Type 2 diabetes mellitus with other skin ulcer: Secondary | ICD-10-CM | POA: Diagnosis not present

## 2018-02-03 DIAGNOSIS — Z7984 Long term (current) use of oral hypoglycemic drugs: Secondary | ICD-10-CM | POA: Diagnosis not present

## 2018-02-03 DIAGNOSIS — L97221 Non-pressure chronic ulcer of left calf limited to breakdown of skin: Secondary | ICD-10-CM | POA: Diagnosis not present

## 2018-02-05 DIAGNOSIS — Z7901 Long term (current) use of anticoagulants: Secondary | ICD-10-CM | POA: Diagnosis not present

## 2018-02-10 DIAGNOSIS — I10 Essential (primary) hypertension: Secondary | ICD-10-CM | POA: Diagnosis not present

## 2018-02-10 DIAGNOSIS — E1151 Type 2 diabetes mellitus with diabetic peripheral angiopathy without gangrene: Secondary | ICD-10-CM | POA: Diagnosis not present

## 2018-02-10 DIAGNOSIS — L97211 Non-pressure chronic ulcer of right calf limited to breakdown of skin: Secondary | ICD-10-CM | POA: Diagnosis not present

## 2018-02-10 DIAGNOSIS — Z7984 Long term (current) use of oral hypoglycemic drugs: Secondary | ICD-10-CM | POA: Diagnosis not present

## 2018-02-10 DIAGNOSIS — Z7901 Long term (current) use of anticoagulants: Secondary | ICD-10-CM | POA: Diagnosis not present

## 2018-02-10 DIAGNOSIS — L97221 Non-pressure chronic ulcer of left calf limited to breakdown of skin: Secondary | ICD-10-CM | POA: Diagnosis not present

## 2018-02-10 DIAGNOSIS — I69351 Hemiplegia and hemiparesis following cerebral infarction affecting right dominant side: Secondary | ICD-10-CM | POA: Diagnosis not present

## 2018-02-10 DIAGNOSIS — F329 Major depressive disorder, single episode, unspecified: Secondary | ICD-10-CM | POA: Diagnosis not present

## 2018-02-10 DIAGNOSIS — E11622 Type 2 diabetes mellitus with other skin ulcer: Secondary | ICD-10-CM | POA: Diagnosis not present

## 2018-02-18 DIAGNOSIS — E1151 Type 2 diabetes mellitus with diabetic peripheral angiopathy without gangrene: Secondary | ICD-10-CM | POA: Diagnosis not present

## 2018-02-18 DIAGNOSIS — I69351 Hemiplegia and hemiparesis following cerebral infarction affecting right dominant side: Secondary | ICD-10-CM | POA: Diagnosis not present

## 2018-02-18 DIAGNOSIS — E11622 Type 2 diabetes mellitus with other skin ulcer: Secondary | ICD-10-CM | POA: Diagnosis not present

## 2018-02-18 DIAGNOSIS — I10 Essential (primary) hypertension: Secondary | ICD-10-CM | POA: Diagnosis not present

## 2018-02-18 DIAGNOSIS — Z7901 Long term (current) use of anticoagulants: Secondary | ICD-10-CM | POA: Diagnosis not present

## 2018-02-18 DIAGNOSIS — Z7984 Long term (current) use of oral hypoglycemic drugs: Secondary | ICD-10-CM | POA: Diagnosis not present

## 2018-02-18 DIAGNOSIS — L97221 Non-pressure chronic ulcer of left calf limited to breakdown of skin: Secondary | ICD-10-CM | POA: Diagnosis not present

## 2018-02-18 DIAGNOSIS — L97211 Non-pressure chronic ulcer of right calf limited to breakdown of skin: Secondary | ICD-10-CM | POA: Diagnosis not present

## 2018-02-18 DIAGNOSIS — F329 Major depressive disorder, single episode, unspecified: Secondary | ICD-10-CM | POA: Diagnosis not present

## 2018-02-22 DIAGNOSIS — E119 Type 2 diabetes mellitus without complications: Secondary | ICD-10-CM | POA: Diagnosis not present

## 2018-02-22 DIAGNOSIS — I6789 Other cerebrovascular disease: Secondary | ICD-10-CM | POA: Diagnosis not present

## 2018-02-22 DIAGNOSIS — G9340 Encephalopathy, unspecified: Secondary | ICD-10-CM | POA: Diagnosis not present

## 2018-02-22 DIAGNOSIS — I69359 Hemiplegia and hemiparesis following cerebral infarction affecting unspecified side: Secondary | ICD-10-CM | POA: Diagnosis not present

## 2018-03-03 DIAGNOSIS — Z7901 Long term (current) use of anticoagulants: Secondary | ICD-10-CM | POA: Diagnosis not present

## 2018-03-16 DIAGNOSIS — M545 Low back pain: Secondary | ICD-10-CM | POA: Diagnosis not present

## 2018-03-16 DIAGNOSIS — M25552 Pain in left hip: Secondary | ICD-10-CM | POA: Diagnosis not present

## 2018-03-19 DIAGNOSIS — Z1389 Encounter for screening for other disorder: Secondary | ICD-10-CM | POA: Diagnosis not present

## 2018-03-19 DIAGNOSIS — Z1159 Encounter for screening for other viral diseases: Secondary | ICD-10-CM | POA: Diagnosis not present

## 2018-03-19 DIAGNOSIS — I10 Essential (primary) hypertension: Secondary | ICD-10-CM | POA: Diagnosis not present

## 2018-03-19 DIAGNOSIS — E1165 Type 2 diabetes mellitus with hyperglycemia: Secondary | ICD-10-CM | POA: Diagnosis not present

## 2018-03-19 DIAGNOSIS — Z7901 Long term (current) use of anticoagulants: Secondary | ICD-10-CM | POA: Diagnosis not present

## 2018-03-19 DIAGNOSIS — N183 Chronic kidney disease, stage 3 (moderate): Secondary | ICD-10-CM | POA: Diagnosis not present

## 2018-03-19 DIAGNOSIS — I69922 Dysarthria following unspecified cerebrovascular disease: Secondary | ICD-10-CM | POA: Diagnosis not present

## 2018-03-19 DIAGNOSIS — I87303 Chronic venous hypertension (idiopathic) without complications of bilateral lower extremity: Secondary | ICD-10-CM | POA: Diagnosis not present

## 2018-03-19 DIAGNOSIS — F329 Major depressive disorder, single episode, unspecified: Secondary | ICD-10-CM | POA: Diagnosis not present

## 2018-03-19 DIAGNOSIS — E785 Hyperlipidemia, unspecified: Secondary | ICD-10-CM | POA: Diagnosis not present

## 2018-03-19 DIAGNOSIS — Z0000 Encounter for general adult medical examination without abnormal findings: Secondary | ICD-10-CM | POA: Diagnosis not present

## 2018-03-19 DIAGNOSIS — Z7189 Other specified counseling: Secondary | ICD-10-CM | POA: Diagnosis not present

## 2018-03-19 DIAGNOSIS — G4730 Sleep apnea, unspecified: Secondary | ICD-10-CM | POA: Diagnosis not present

## 2018-03-23 ENCOUNTER — Encounter (HOSPITAL_BASED_OUTPATIENT_CLINIC_OR_DEPARTMENT_OTHER): Payer: Self-pay | Admitting: *Deleted

## 2018-03-23 ENCOUNTER — Observation Stay (HOSPITAL_BASED_OUTPATIENT_CLINIC_OR_DEPARTMENT_OTHER)
Admission: EM | Admit: 2018-03-23 | Discharge: 2018-03-26 | Disposition: A | Discharge status: Home / Self Care | Payer: Medicare HMO | Attending: Family Medicine | Admitting: Family Medicine

## 2018-03-23 ENCOUNTER — Other Ambulatory Visit: Payer: Self-pay

## 2018-03-23 DIAGNOSIS — M549 Dorsalgia, unspecified: Secondary | ICD-10-CM

## 2018-03-23 DIAGNOSIS — E785 Hyperlipidemia, unspecified: Secondary | ICD-10-CM | POA: Insufficient documentation

## 2018-03-23 DIAGNOSIS — M4856XA Collapsed vertebra, not elsewhere classified, lumbar region, initial encounter for fracture: Secondary | ICD-10-CM | POA: Diagnosis not present

## 2018-03-23 DIAGNOSIS — R2681 Unsteadiness on feet: Secondary | ICD-10-CM | POA: Diagnosis not present

## 2018-03-23 DIAGNOSIS — G8111 Spastic hemiplegia affecting right dominant side: Secondary | ICD-10-CM | POA: Diagnosis not present

## 2018-03-23 DIAGNOSIS — Z8249 Family history of ischemic heart disease and other diseases of the circulatory system: Secondary | ICD-10-CM | POA: Insufficient documentation

## 2018-03-23 DIAGNOSIS — Z86711 Personal history of pulmonary embolism: Secondary | ICD-10-CM | POA: Insufficient documentation

## 2018-03-23 DIAGNOSIS — E1122 Type 2 diabetes mellitus with diabetic chronic kidney disease: Secondary | ICD-10-CM | POA: Insufficient documentation

## 2018-03-23 DIAGNOSIS — F319 Bipolar disorder, unspecified: Secondary | ICD-10-CM | POA: Insufficient documentation

## 2018-03-23 DIAGNOSIS — N183 Chronic kidney disease, stage 3 (moderate): Secondary | ICD-10-CM | POA: Diagnosis not present

## 2018-03-23 DIAGNOSIS — Z8673 Personal history of transient ischemic attack (TIA), and cerebral infarction without residual deficits: Secondary | ICD-10-CM | POA: Insufficient documentation

## 2018-03-23 DIAGNOSIS — I129 Hypertensive chronic kidney disease with stage 1 through stage 4 chronic kidney disease, or unspecified chronic kidney disease: Secondary | ICD-10-CM | POA: Insufficient documentation

## 2018-03-23 DIAGNOSIS — Z7901 Long term (current) use of anticoagulants: Secondary | ICD-10-CM | POA: Insufficient documentation

## 2018-03-23 DIAGNOSIS — G4733 Obstructive sleep apnea (adult) (pediatric): Secondary | ICD-10-CM | POA: Insufficient documentation

## 2018-03-23 DIAGNOSIS — F418 Other specified anxiety disorders: Secondary | ICD-10-CM | POA: Diagnosis not present

## 2018-03-23 DIAGNOSIS — Z7984 Long term (current) use of oral hypoglycemic drugs: Secondary | ICD-10-CM | POA: Insufficient documentation

## 2018-03-23 DIAGNOSIS — Z880 Allergy status to penicillin: Secondary | ICD-10-CM | POA: Insufficient documentation

## 2018-03-23 DIAGNOSIS — Z6839 Body mass index (BMI) 39.0-39.9, adult: Secondary | ICD-10-CM | POA: Diagnosis not present

## 2018-03-23 DIAGNOSIS — M545 Low back pain, unspecified: Secondary | ICD-10-CM | POA: Diagnosis present

## 2018-03-23 DIAGNOSIS — K8689 Other specified diseases of pancreas: Secondary | ICD-10-CM | POA: Diagnosis not present

## 2018-03-23 DIAGNOSIS — E669 Obesity, unspecified: Secondary | ICD-10-CM | POA: Diagnosis not present

## 2018-03-23 DIAGNOSIS — Z79899 Other long term (current) drug therapy: Secondary | ICD-10-CM | POA: Insufficient documentation

## 2018-03-23 DIAGNOSIS — S32040A Wedge compression fracture of fourth lumbar vertebra, initial encounter for closed fracture: Secondary | ICD-10-CM | POA: Diagnosis present

## 2018-03-23 HISTORY — DX: Unspecified abnormalities of gait and mobility: R26.9

## 2018-03-23 HISTORY — DX: Other sequelae of cerebral infarction: I69.398

## 2018-03-23 HISTORY — DX: Muscle spasm of back: M62.830

## 2018-03-23 HISTORY — DX: Chronic kidney disease, stage 3 unspecified: N18.30

## 2018-03-23 HISTORY — DX: Chronic kidney disease, stage 3 (moderate): N18.3

## 2018-03-23 HISTORY — DX: Chronic venous hypertension (idiopathic) without complications of bilateral lower extremity: I87.303

## 2018-03-23 HISTORY — DX: Hyperlipidemia, unspecified: E78.5

## 2018-03-23 HISTORY — DX: Dysarthria and anarthria: R47.1

## 2018-03-23 NOTE — ED Triage Notes (Signed)
2 weeks ago he had pain across his lower back. He had xrays that were negative. He was given pain medication and told to see his MD. His MD saw him and thinks he had a pulled muscle. The pain continues in his back with pain down his left leg. Hx of stroke in 2015 with right sided weakness.

## 2018-03-24 ENCOUNTER — Emergency Department (HOSPITAL_BASED_OUTPATIENT_CLINIC_OR_DEPARTMENT_OTHER): Payer: Medicare HMO

## 2018-03-24 ENCOUNTER — Encounter (HOSPITAL_BASED_OUTPATIENT_CLINIC_OR_DEPARTMENT_OTHER): Payer: Self-pay | Admitting: *Deleted

## 2018-03-24 DIAGNOSIS — E785 Hyperlipidemia, unspecified: Secondary | ICD-10-CM | POA: Diagnosis not present

## 2018-03-24 DIAGNOSIS — M549 Dorsalgia, unspecified: Secondary | ICD-10-CM

## 2018-03-24 DIAGNOSIS — E119 Type 2 diabetes mellitus without complications: Secondary | ICD-10-CM | POA: Diagnosis not present

## 2018-03-24 DIAGNOSIS — F319 Bipolar disorder, unspecified: Secondary | ICD-10-CM | POA: Diagnosis not present

## 2018-03-24 DIAGNOSIS — Z7901 Long term (current) use of anticoagulants: Secondary | ICD-10-CM | POA: Diagnosis not present

## 2018-03-24 DIAGNOSIS — Z6839 Body mass index (BMI) 39.0-39.9, adult: Secondary | ICD-10-CM | POA: Diagnosis not present

## 2018-03-24 DIAGNOSIS — I6789 Other cerebrovascular disease: Secondary | ICD-10-CM | POA: Diagnosis not present

## 2018-03-24 DIAGNOSIS — Z7984 Long term (current) use of oral hypoglycemic drugs: Secondary | ICD-10-CM | POA: Diagnosis not present

## 2018-03-24 DIAGNOSIS — R2681 Unsteadiness on feet: Secondary | ICD-10-CM | POA: Diagnosis not present

## 2018-03-24 DIAGNOSIS — Z880 Allergy status to penicillin: Secondary | ICD-10-CM | POA: Diagnosis not present

## 2018-03-24 DIAGNOSIS — M545 Low back pain, unspecified: Secondary | ICD-10-CM | POA: Diagnosis present

## 2018-03-24 DIAGNOSIS — S32040A Wedge compression fracture of fourth lumbar vertebra, initial encounter for closed fracture: Secondary | ICD-10-CM | POA: Diagnosis present

## 2018-03-24 DIAGNOSIS — N183 Chronic kidney disease, stage 3 (moderate): Secondary | ICD-10-CM | POA: Diagnosis not present

## 2018-03-24 DIAGNOSIS — K8689 Other specified diseases of pancreas: Secondary | ICD-10-CM | POA: Diagnosis not present

## 2018-03-24 DIAGNOSIS — M4856XA Collapsed vertebra, not elsewhere classified, lumbar region, initial encounter for fracture: Secondary | ICD-10-CM | POA: Diagnosis not present

## 2018-03-24 DIAGNOSIS — G4733 Obstructive sleep apnea (adult) (pediatric): Secondary | ICD-10-CM | POA: Diagnosis not present

## 2018-03-24 DIAGNOSIS — G9340 Encephalopathy, unspecified: Secondary | ICD-10-CM | POA: Diagnosis not present

## 2018-03-24 DIAGNOSIS — E1122 Type 2 diabetes mellitus with diabetic chronic kidney disease: Secondary | ICD-10-CM | POA: Diagnosis not present

## 2018-03-24 DIAGNOSIS — F418 Other specified anxiety disorders: Secondary | ICD-10-CM | POA: Diagnosis not present

## 2018-03-24 DIAGNOSIS — Z79899 Other long term (current) drug therapy: Secondary | ICD-10-CM | POA: Diagnosis not present

## 2018-03-24 DIAGNOSIS — G8111 Spastic hemiplegia affecting right dominant side: Secondary | ICD-10-CM | POA: Diagnosis not present

## 2018-03-24 DIAGNOSIS — Z8249 Family history of ischemic heart disease and other diseases of the circulatory system: Secondary | ICD-10-CM | POA: Diagnosis not present

## 2018-03-24 DIAGNOSIS — Z8673 Personal history of transient ischemic attack (TIA), and cerebral infarction without residual deficits: Secondary | ICD-10-CM | POA: Diagnosis not present

## 2018-03-24 DIAGNOSIS — I129 Hypertensive chronic kidney disease with stage 1 through stage 4 chronic kidney disease, or unspecified chronic kidney disease: Secondary | ICD-10-CM | POA: Diagnosis not present

## 2018-03-24 DIAGNOSIS — E669 Obesity, unspecified: Secondary | ICD-10-CM | POA: Diagnosis not present

## 2018-03-24 DIAGNOSIS — Z86711 Personal history of pulmonary embolism: Secondary | ICD-10-CM | POA: Diagnosis not present

## 2018-03-24 DIAGNOSIS — I69359 Hemiplegia and hemiparesis following cerebral infarction affecting unspecified side: Secondary | ICD-10-CM | POA: Diagnosis not present

## 2018-03-24 LAB — CBC WITH DIFFERENTIAL/PLATELET
Basophils Absolute: 0 10*3/uL (ref 0.0–0.1)
Basophils Relative: 0 %
Eosinophils Absolute: 0.2 10*3/uL (ref 0.0–0.7)
Eosinophils Relative: 3 %
HEMATOCRIT: 46.4 % (ref 39.0–52.0)
Hemoglobin: 16.4 g/dL (ref 13.0–17.0)
Lymphocytes Relative: 27 %
Lymphs Abs: 2.1 10*3/uL (ref 0.7–4.0)
MCH: 31.2 pg (ref 26.0–34.0)
MCHC: 35.3 g/dL (ref 30.0–36.0)
MCV: 88.4 fL (ref 78.0–100.0)
MONO ABS: 0.6 10*3/uL (ref 0.1–1.0)
Monocytes Relative: 7 %
Neutro Abs: 5 10*3/uL (ref 1.7–7.7)
Neutrophils Relative %: 63 %
Platelets: 177 10*3/uL (ref 150–400)
RBC: 5.25 MIL/uL (ref 4.22–5.81)
RDW: 13.7 % (ref 11.5–15.5)
WBC: 7.9 10*3/uL (ref 4.0–10.5)

## 2018-03-24 LAB — URINALYSIS, MICROSCOPIC (REFLEX)

## 2018-03-24 LAB — BASIC METABOLIC PANEL
Anion gap: 10 (ref 5–15)
BUN: 22 mg/dL — AB (ref 6–20)
CALCIUM: 9.4 mg/dL (ref 8.9–10.3)
CO2: 22 mmol/L (ref 22–32)
Chloride: 108 mmol/L (ref 101–111)
Creatinine, Ser: 1.21 mg/dL (ref 0.61–1.24)
GFR calc Af Amer: >60 mL/min (ref >60)
GLUCOSE: 108 mg/dL — AB (ref 65–99)
Potassium: 4.1 mmol/L (ref 3.5–5.1)
SODIUM: 140 mmol/L (ref 135–145)

## 2018-03-24 LAB — URINALYSIS, ROUTINE W REFLEX MICROSCOPIC
Bilirubin Urine: NEGATIVE
GLUCOSE, UA: NEGATIVE mg/dL
Ketones, ur: 15 mg/dL — AB
LEUKOCYTES UA: NEGATIVE
Nitrite: NEGATIVE
Protein, ur: 100 mg/dL — AB
SPECIFIC GRAVITY, URINE: 1.025 (ref 1.005–1.030)
pH: 6 (ref 5.0–8.0)

## 2018-03-24 LAB — CBG MONITORING, ED: GLUCOSE-CAPILLARY: 75 mg/dL (ref 65–99)

## 2018-03-24 LAB — GLUCOSE, CAPILLARY
GLUCOSE-CAPILLARY: 109 mg/dL — AB (ref 65–99)
GLUCOSE-CAPILLARY: 134 mg/dL — AB (ref 65–99)

## 2018-03-24 LAB — PROTIME-INR
INR: 2.04
Prothrombin Time: 22.9 seconds — ABNORMAL HIGH (ref 11.4–15.2)

## 2018-03-24 MED ORDER — WARFARIN - PHYSICIAN DOSING INPATIENT: Freq: Every day | Status: DC

## 2018-03-24 MED ORDER — TRAMADOL HCL 50 MG PO TABS: 50.0000 mg | ORAL_TABLET | Freq: Four times a day (QID) | ORAL | Status: DC | PRN

## 2018-03-24 MED ORDER — FENTANYL CITRATE (PF) 100 MCG/2ML IJ SOLN
50.0000 ug | Freq: Once | INTRAMUSCULAR | Status: AC
  Administered 2018-03-24: 50 ug via INTRAVENOUS
  Filled 2018-03-24: qty 2

## 2018-03-24 MED ORDER — ONDANSETRON HCL 4 MG/2ML IJ SOLN
4.0000 mg | Freq: Once | INTRAMUSCULAR | Status: AC
  Administered 2018-03-24: 4 mg via INTRAVENOUS
  Filled 2018-03-24: qty 2

## 2018-03-24 MED ORDER — HYDRALAZINE HCL 25 MG PO TABS
25.0000 mg | ORAL_TABLET | Freq: Two times a day (BID) | ORAL | Status: DC
  Administered 2018-03-24 – 2018-03-26 (×4): 25 mg via ORAL
  Filled 2018-03-24 (×4): qty 1

## 2018-03-24 MED ORDER — PIOGLITAZONE HCL 15 MG PO TABS
7.5000 mg | ORAL_TABLET | Freq: Every day | ORAL | Status: DC
  Administered 2018-03-25 – 2018-03-26 (×2): 7.5 mg via ORAL
  Filled 2018-03-24 (×2): qty 0.5

## 2018-03-24 MED ORDER — INSULIN ASPART 100 UNIT/ML ~~LOC~~ SOLN
0.0000 [IU] | Freq: Three times a day (TID) | SUBCUTANEOUS | Status: DC
  Administered 2018-03-25 (×3): 1 [IU] via SUBCUTANEOUS
  Administered 2018-03-26: 2 [IU] via SUBCUTANEOUS

## 2018-03-24 MED ORDER — ACETAMINOPHEN 325 MG PO TABS: 650.0000 mg | ORAL_TABLET | Freq: Four times a day (QID) | ORAL | Status: DC | PRN

## 2018-03-24 MED ORDER — PREDNISONE 20 MG PO TABS
40.0000 mg | ORAL_TABLET | Freq: Every day | ORAL | Status: DC
  Administered 2018-03-24 – 2018-03-26 (×3): 40 mg via ORAL
  Filled 2018-03-24 (×3): qty 2

## 2018-03-24 MED ORDER — WARFARIN - PHARMACIST DOSING INPATIENT: Freq: Every day | Status: DC

## 2018-03-24 MED ORDER — WARFARIN SODIUM 2.5 MG PO TABS
2.5000 mg | ORAL_TABLET | Freq: Every day | ORAL | Status: DC
  Administered 2018-03-24: 2.5 mg via ORAL
  Filled 2018-03-24 (×3): qty 1

## 2018-03-24 MED ORDER — AMLODIPINE BESYLATE 10 MG PO TABS
10.0000 mg | ORAL_TABLET | Freq: Every day | ORAL | Status: DC
  Administered 2018-03-24 – 2018-03-26 (×3): 10 mg via ORAL
  Filled 2018-03-24: qty 1
  Filled 2018-03-24: qty 2
  Filled 2018-03-24: qty 1

## 2018-03-24 MED ORDER — IOPAMIDOL (ISOVUE-300) INJECTION 61%
100.0000 mL | Freq: Once | INTRAVENOUS | Status: AC | PRN
  Administered 2018-03-24: 100 mL via INTRAVENOUS

## 2018-03-24 MED ORDER — ARIPIPRAZOLE 5 MG PO TABS
2.5000 mg | ORAL_TABLET | Freq: Every day | ORAL | Status: DC
  Administered 2018-03-24 – 2018-03-26 (×3): 2.5 mg via ORAL
  Filled 2018-03-24 (×3): qty 1

## 2018-03-24 MED ORDER — HYDROCODONE-ACETAMINOPHEN 5-325 MG PO TABS: 1.0000 | ORAL_TABLET | ORAL | Status: DC | PRN

## 2018-03-24 MED ORDER — IRBESARTAN 75 MG PO TABS
37.5000 mg | ORAL_TABLET | Freq: Every day | ORAL | Status: DC
  Administered 2018-03-24 – 2018-03-26 (×3): 37.5 mg via ORAL
  Filled 2018-03-24 (×3): qty 1

## 2018-03-24 MED ORDER — WARFARIN SODIUM 2.5 MG PO TABS
2.5000 mg | ORAL_TABLET | Freq: Once | ORAL | Status: AC
Start: 2018-03-24 — End: 2018-03-24
  Administered 2018-03-24: 2.5 mg via ORAL

## 2018-03-24 MED ORDER — ISOSORB DINITRATE-HYDRALAZINE 20-37.5 MG PO TABS
1.0000 | ORAL_TABLET | Freq: Two times a day (BID) | ORAL | Status: DC
  Administered 2018-03-24: 1 via ORAL
  Filled 2018-03-24 (×2): qty 1

## 2018-03-24 MED ORDER — FENTANYL CITRATE (PF) 100 MCG/2ML IJ SOLN: 25.0000 ug | INTRAMUSCULAR | Status: DC | PRN

## 2018-03-24 MED ORDER — SERTRALINE HCL 50 MG PO TABS
250.0000 mg | ORAL_TABLET | Freq: Every day | ORAL | Status: DC
  Administered 2018-03-24 – 2018-03-26 (×3): 250 mg via ORAL
  Filled 2018-03-24 (×3): qty 2

## 2018-03-24 MED ORDER — HYDRALAZINE HCL 20 MG/ML IJ SOLN: 10.0000 mg | INTRAMUSCULAR | Status: DC | PRN

## 2018-03-24 MED ORDER — DOCUSATE SODIUM 100 MG PO CAPS: 100.0000 mg | ORAL_CAPSULE | Freq: Every day | ORAL | Status: DC | PRN

## 2018-03-24 MED ORDER — ISOSORBIDE MONONITRATE ER 30 MG PO TB24
30.0000 mg | ORAL_TABLET | Freq: Every day | ORAL | Status: DC
  Administered 2018-03-25 – 2018-03-26 (×2): 30 mg via ORAL
  Filled 2018-03-24 (×2): qty 1

## 2018-03-24 NOTE — Progress Notes (Signed)
ANTICOAGULATION CONSULT NOTE - Initial Consult  Pharmacy Consult for Warfarin Indication: history of PE, stroke  Allergies  Allergen Reactions  . Penicillins Hives and Other (See Comments)    Has patient had a PCN reaction causing immediate rash, facial/tongue/throat swelling, SOB or lightheadedness with hypotension: no Has patient had a PCN reaction causing severe rash involving mucus membranes or skin necrosis: no Has patient had a PCN reaction that required hospitalization: no Has patient had a PCN reaction occurring within the last 10 years: no If all of the above answers are "NO", then may proceed with Cephalosporin use.     Patient Measurements: Height:  (170.2 cm) Weight: 254 lb 6.6 oz (115.4 kg) IBW/kg (Calculated) : 66.1  Vital Signs: Temp: 98.7 F (37.1 C) (05/08 1439) Temp Source: Oral (05/08 1439) BP: 138/66 (05/08 1439) Pulse Rate: 75 (05/08 1439)  Labs: Recent Labs    03/24/18 0210  HGB 16.4  HCT 46.4  PLT 177  LABPROT 22.9*  INR 2.04  CREATININE 1.21    Estimated Creatinine Clearance: 72.9 mL/min (by C-G formula based on SCr of 1.21 mg/dL).   Medical History: Past Medical History:  Diagnosis Date  . 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 Back spasm   . Chronic kidney disease (CKD), stage III (moderate) (HCC)   . Depression with anxiety   . Diabetes mellitus   . Dysarthria   . Dyslipidemia   . Hypertension   . Obesity   . OSA on CPAP   . Pulmonary emboli ([facility]) 02/2014  . Stasis edema of both lower extremities   . Stroke [facility]) July 2013   left paramedian pontine, incidental right parietal subcortical. both d/t small vessel disease  . Stroke [facility]) March 2015   left basal ganglia secondary to small vessel disease    Assessment: 33 y/oM on warfarin for history of stroke and PE admitted on 03/24/18 with lower back pain secondary to L4 compression fracture. Pharmacy consulted to assist with dosing of warfarin while  patient admitted in the hospital. Reported PTA dose is  on Monday/Wednesday and 2.5mg  all other days of the week, with last dose at home on 03/23/18 at 1200. Patient also received a dose of warfarin 2.5mg  PO this morning at 1112 at [facility] per [facility]. INR 2.04 today. CBC WNL.   Goal of Therapy:  INR 2-3 Monitor platelets by anticoagulation protocol: Yes   Plan:  Give additional warfarin 2.5mg  PO x 1 now (total  today as per home regimen)-discussed with patient's primary RN, Consuello Masse.  Daily PT/INR Monitor CBC and for s/sx of bleeding   Greer Pickerel, PharmD, BCPS Pager: [phone] 03/24/2018 5:06 PM

## 2018-03-24 NOTE — ED Notes (Signed)
Updated, no changes.  

## 2018-03-24 NOTE — H&P (Addendum)
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 secondary to continued pain CT scan done showed acute L4 compression fracture and difficulty with left hip flexion  ED Course: Patient was given multiple rounds of fentanyl and also given Norco but because pain was not relieved patient was referred to [facility]  Review of Systems:   No nausea no paresthesia no loss of bladder bowel control Negative for fever, visual changes, sore throat, rash, new muscle aches, chest pain, SOB, dysuria, bleeding, n/v/abdominal pain.  Past Medical History:  Diagnosis Date  . 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 Back spasm   . Chronic kidney disease (CKD), stage III (moderate) (HCC)   . Depression with anxiety   . Diabetes mellitus   . Dysarthria   . Dyslipidemia   . Hypertension   . Obesity   . OSA on CPAP   . Pulmonary emboli ([place]) 02/2014  . Stasis edema of both lower extremities   . Stroke [facility]) July 2013   left paramedian pontine, incidental right parietal subcortical. both d/t small vessel disease  . Stroke [facility]) March 2015   left basal ganglia secondary to small vessel disease    Past Surgical History:  Procedure Laterality Date  . ANAL FISSURE REPAIR    . TONSILLECTOMY       reports that he has never smoked. He has never used smokeless tobacco. He reports that he does not drink alcohol or use drugs. Mobility: At baseline gets around with a wheelchair and heavy Rollator is alone most of the day but his wife comes home in the  evening Tells me he would not like to go to a skilled facility  Allergies  Allergen Reactions  . Penicillins Hives and Other (See Comments)    Has patient had a PCN reaction causing immediate rash, facial/tongue/throat swelling, SOB or lightheadedness with hypotension: no Has patient had a PCN reaction causing severe rash involving mucus membranes or skin necrosis: no Has patient had a PCN reaction that required hospitalization: no Has patient had a PCN reaction occurring within the last 10 years: no If all of the above answers are "NO", then may proceed with Cephalosporin use.     Family History  Problem Relation Age of Onset  . Cancer Mother   . Heart disease Father      Prior to Admission medications   Medication Sig Start Date End Date Taking? Authorizing Provider  acetaminophen (TYLENOL) 325 MG tablet Take 650 mg by mouth every 6 (six) hours as needed (For headache or pain.).    Yes [provider]  amLODipine (NORVASC) 10 MG tablet Take 10 mg by mouth daily.   Yes [provider]  ARIPiprazole (ABILIFY) 5 MG tablet Take 2.5 mg by mouth daily.  03/22/18  Yes [provider]  baclofen (LIORESAL) 20 MG tablet Take 1 tablet (20 mg total) by mouth 3 (three) times daily. Patient taking differently: Take 20 mg by mouth 2 (two) times daily.  03/03/14  Yes Love, Ivan Anchors, PA-C  Blood Glucose Monitoring Suppl (ONE TOUCH ULTRA 2) W/DEVICE KIT  05/14/14  Yes  [provider]  docusate sodium (COLACE) 100 MG capsule Take 100 mg by mouth every Monday, Wednesday, and Friday.    Yes [provider]  furosemide (LASIX) 20 MG tablet Take 20 mg by mouth daily. 03/11/18  Yes [provider]  hydrALAZINE (APRESOLINE) 25 MG tablet Take 25 mg by mouth 2 (two) times daily.  01/24/18  Yes [provider]  isosorbide mononitrate (IMDUR) 30 MG 24 hr tablet Take 30 mg by mouth daily. 01/31/18  Yes [provider]  Menthol, Topical Analgesic,  (BENGAY EX) Apply 1 application topically as needed (For pain.).   Yes [provider]  olmesartan (BENICAR) 5 MG tablet Take 5 mg by mouth daily. 02/24/18  Yes [provider]  ondansetron (ZOFRAN) 4 MG tablet Take 4 mg by mouth every 6 (six) hours as needed for nausea or vomiting.  12/22/17  Yes [provider]  ONE TOUCH ULTRA TEST test strip  05/14/14  Yes [provider]  Jonetta Speak LANCETS 10F [place]  05/14/14  Yes [provider]  pioglitazone (ACTOS) 15 MG tablet Take 7.5 mg by mouth daily.    Yes [provider]  potassium chloride SA (K-DUR,KLOR-CON) 20 MEQ tablet Take 20 mEq by mouth daily. 02/16/18  Yes [provider]  sertraline (ZOLOFT) 100 MG tablet Take 250 mg by mouth daily.    Yes [provider]  simvastatin (ZOCOR) 20 MG tablet Take 1 tablet (20 mg total) by mouth daily at 6 PM. 05/18/12  Yes Rizwan, Eunice Blase, MD  traMADol (ULTRAM) 50 MG tablet Take 50 mg by mouth 4 (four) times daily as needed (For pain.).  03/16/18  Yes [provider]  valsartan (DIOVAN) 80 MG tablet Take 80 mg by mouth daily.  02/11/17  Yes [provider]  Vitamin D, Ergocalciferol, (DRISDOL) 50000 UNITS CAPS capsule Take 50,000 Units by mouth every Friday.  06/26/13  Yes [provider]  warfarin (COUMADIN) 2.5 MG tablet Take 2.5-5 mg by mouth daily. Take two tablets on Monday and Wednesday and one tablet the rest of the week.   Yes [provider]    Physical Exam:   Awake obese no distress no pallor no icterus Mallampati 3  S1-S2 no murmur  Abdomen soft obese nontender no organomegaly  No lower extremity edema  Cannot appreciate fully his back pain however it seems to be lower thoracic upper vertebral spine--  He is unable to turn in the bed independently he has dense hemiparesis on the right side and is unable to raise hand or leg on right side  I have personally reviewed following labs and  imaging studies  Labs:   BUN/creatinine 22/1.2, CBC normal, imaging studies as above  Imaging studies:   ct   Medical tests:   EKG independently reviewed: Reviewed briefly  Test discussed with performing physician:  No  Decision to obtain old records:   No  Review and summation of old records:   Yes  Active Problems:   Compression fracture of L4 lumbar vertebra, closed, initial encounter ([place])   Assessment/Plan Lumbago secondary to fracture of vertebra-placing TLSO brace, pain control with Norco and escalate to Percocet if needed bowel regimen-we will get therapy evaluations although I am doubtful patient would be willing to stay in the hospital we will keep him as an observation patient I doubt he would make inpatient criterion I have explained to him clearly and carefully that I doubt he would benefit from surgical management of the  same May benefit however from steroid burst and I will start low-dose prednisone 40 mg and he can be evaluated tomorrow by my partner to see if he is stable for discharge--he represents high risk for readmission if he is not able to mobilize with therapy appropriately and I have asked case manager and social worker to look in on him in the morning  Hypertension-continue amlodipine 10 mg, hydralazine and Imdur--pressure stable at this time-I have held off on the patient's Lasix in addition to ARB to be resumed in a.m.  Diabetes mellitus-he is on pioglitazone at home which can be continued for now with low risk of hypoglycemia Last A1c was 6.9 in 2015 with an hour system May benefit from an A1c and a sliding scale implementation will be started given we are giving him steroids overnight  Massive PE in the past continue Coumadin 2.5 daily dose per pharmacy  Bipolar-continue Abilify and other meds-Zoloft  Rest of medical issues at this time are stable main issue right now is disposition  Severity of Illness: The appropriate patient status for  this patient is OBSERVATION. Observation status is judged to be reasonable and necessary in order to provide the required intensity of service to ensure the patient's safety. The patient's presenting symptoms, physical exam findings, and initial radiographic and laboratory data in the context of their medical condition is felt to place them at decreased risk for further clinical deterioration. Furthermore, it is anticipated that the patient will be medically stable for discharge from the hospital within 2 midnights of admission. The following factors support the patient status of observation.   " The patient's presenting symptoms include severe pain. " The physical exam findings include pain. " The initial radiographic and laboratory data are show fracture.      DVT prophylaxis: Therapeutic on Coumadin Code Status: Full code Family Communication: None Consults called: None  Time spent: 40 minutes  Verneita Griffes, MD [facility] [phone]   03/24/2018, 3:11 PM

## 2018-03-24 NOTE — Plan of Care (Signed)
Discussed with Dr. Bebe Shaggy.  Mr. Dayal is a 67 year old male with pmh of CVA with residual right-sided weakness, CKD stage 3, DVT/PE on Coumadin; who presents with increasing low back pain after recent fall.  Vital signs reveal BP up to  191/89.  Labs otherwise unremarkable.  CT scan of the abdomen pelvis reveals acute L4 compression fracture.  Patient given total 100 mcg of fentanyl without relief of pain symptoms.  Patient may benefit from TLSO brace.  Accepted as observation to a telemetry bed.

## 2018-03-24 NOTE — Progress Notes (Signed)
Patient was transferred from [facility] med center. Alert and oriented x 4; back pain. Vital signe was taken. Will monitor patient as protocol. Paged admission nurse and flow manager as patient arrived in [place].

## 2018-03-24 NOTE — Progress Notes (Addendum)
Coumadin 2.5mg  was given as ordered at 1800.

## 2018-03-24 NOTE — ED Notes (Signed)
EDP at BS discussing results and admission plan.  

## 2018-03-24 NOTE — ED Notes (Addendum)
H/o stroke. Flaccid RUE/RLE. Prefers to sit in w/c. Wife at side. Pt alert, NAD, calm, interactive, resps e/u, dysarthric, no dyspnea noted, skin W&D, VSS, BP elevated, c/o L lower back pain, constant, mentions some radiation down thigh to knee, reports worse with movement and palpation, (denies: fever, urinary sx, flank pain, bleeding, hematuria, sob, NVD, dizziness or visual changes). 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 No relief with tramadol. Tramadol stopped when seen by [facility] for same. Takes baclofen. Family at [facility]. Strong urine odor.

## 2018-03-24 NOTE — ED Notes (Signed)
EDP into room, pt to CT, alert, NAD, calm, interactive.

## 2018-03-24 NOTE — Progress Notes (Signed)
Called [employer] to order TLSO brace as ordered at 1630 on 03/24/18

## 2018-03-24 NOTE — ED Provider Notes (Signed)
[place] EMERGENCY DEPARTMENT Provider Note   CSN: [id number] Arrival date & time: 03/23/18  2005     History   Chief Complaint Chief Complaint  Patient presents with  . Back Pain  . Leg Pain    HPI Dakota Hall is a 66 y.o. male.  The history is provided by the patient and the spouse.  Back Pain   This is a new problem. The current episode started more than 1 week ago. The problem occurs daily. The problem has been gradually worsening. The pain is associated with no known injury. The pain is present in the lumbar spine. The pain is severe. The symptoms are aggravated by certain positions. Associated symptoms include leg pain. Pertinent negatives include no chest pain, no fever, no abdominal pain, no bowel incontinence and no bladder incontinence. He has tried muscle relaxants (tramadol) for the symptoms. The treatment provided no relief.  Leg Pain     Patient with multiple medical conditions including diabetes, previous stroke with right-sided weakness, history of PE on Coumadin presents with low back pain that radiates in both legs.  This is been ongoing for over 2 weeks.  It seems to be worse with standing up.  After the pain started, he did end up falling out of his wheelchair exacerbating the pain No head injury.  No chest pain or abdominal pain.  He has chronic weakness in the right leg is unchanged.  There is no new weakness in his left arm or leg He has already been seen by PCP and had negative x-rays of his low back Past Medical History:  Diagnosis Date  . 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 Back spasm   . Chronic kidney disease (CKD), stage III (moderate) (HCC)   . Depression with anxiety   . Diabetes mellitus   . Dysarthria   . Dyslipidemia   . Hypertension   . Obesity   . OSA on CPAP   . Pulmonary emboli ([place]) 02/2014  . Stasis edema of both lower extremities   . Stroke [facility]) July 2013   left paramedian pontine,  incidental right parietal subcortical. both d/t small vessel disease  . Stroke [facility]) March 2015   left basal ganglia secondary to small vessel disease    Patient Active Problem List   Diagnosis Date Noted  . Pulmonary embolism ([place]) 05/03/2014  . AKI (acute kidney injury) ([place]) 04/22/2014  . Bradycardia 04/22/2014  . Encephalopathy 04/22/2014  . Malnutrition of moderate degree ([place]) 03/15/2014  . CVA (cerebral infarction) 02/08/2014  . Depression with anxiety   . 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 Obesity   . Stroke ([place]) 02/03/2014  . Unspecified cerebral artery occlusion with cerebral infarction 07/20/2013  . Stroke, small vessel ([place]) 05/18/2012  . Slurred speech 05/15/2012  . HTN (hypertension) 05/15/2012  . Noncompliance with medication regimen 05/15/2012  . DM (diabetes mellitus) ([place]) 05/15/2012  . Vision changes 05/15/2012    Past Surgical History:  Procedure Laterality Date  . ANAL FISSURE REPAIR    . TONSILLECTOMY          Home Medications    Prior to Admission medications   Medication Sig Start Date End Date Taking? Authorizing Provider  acetaminophen (TYLENOL) 325 MG tablet Take 650 mg by mouth every 6 (six) hours as needed.    [provider]  amLODipine (NORVASC) 10 MG tablet Take 10 mg by mouth daily.    [provider]  baclofen (LIORESAL) 20 MG  tablet Take 1 tablet (20 mg total) by mouth 3 (three) times daily. Patient taking differently: Take 20 mg by mouth 2 (two) times daily.  03/03/14   Love, Ivan Anchors, PA-C  Blood Glucose Monitoring Suppl (ONE TOUCH ULTRA 2) W/DEVICE KIT  05/14/14   [provider]  docusate sodium (COLACE) 100 MG capsule Take 100 mg by mouth daily as needed for mild constipation.    [provider]  isosorbide-hydrALAZINE (BIDIL) 20-37.5 MG per tablet Take 1 tablet by mouth 2 (two) times daily. 04/26/14   Hosie Poisson, MD  ONE TOUCH ULTRA TEST test strip  05/14/14   [provider]    Jonetta Speak LANCETS 11H [place]  05/14/14   [provider]  pioglitazone (ACTOS) 15 MG tablet Take 15 mg by mouth daily.    [provider]  sertraline (ZOLOFT) 50 MG tablet Take 5 tablets (250 mg total) by mouth at bedtime. 04/26/14   Hosie Poisson, MD  simvastatin (ZOCOR) 20 MG tablet Take 1 tablet (20 mg total) by mouth daily at 6 PM. 05/18/12   Debbe Odea, MD  valsartan (DIOVAN) 80 MG tablet  02/11/17   [provider]  Vitamin D, Ergocalciferol, (DRISDOL) 50000 UNITS CAPS capsule 50,000 Units every 7 (seven) days.  06/26/13   [provider]  warfarin (COUMADIN) 2.5 MG tablet Take 2.5 mg by mouth daily. Take 2.5 every other day for 3 days,other days 5.0 for 4 days    [provider]    Family History Family History  Problem Relation Age of Onset  . Cancer Mother   . Heart disease Father     Social History Social History   Tobacco Use  . Smoking status: Never Smoker  . Smokeless tobacco: Never Used  Substance Use Topics  . Alcohol use: No    Alcohol/week: 0.0 oz  . Drug use: No     Allergies   Penicillins   Review of Systems Review of Systems  Constitutional: Negative for fever.  Cardiovascular: Negative for chest pain.  Gastrointestinal: Negative for abdominal pain and bowel incontinence.  Genitourinary: Negative for bladder incontinence.  Musculoskeletal: Positive for back pain.  Neurological:       Denies new weakness Chronic dysarthria noted  All other systems reviewed and are negative.    Physical Exam Updated Vital Signs BP (!) 191/89 (BP Location: Left Arm)   Pulse (!) 58   Temp 98.2 F (36.8 C) (Oral)   Resp 18   Ht 1.702 m (_0 )   Wt 111.6 kg (246 lb)   SpO2 97%   BMI 38.53 kg/m   Physical Exam  CONSTITUTIONAL: chronically Ill-appearing, prefers to be sitting in wheelchair HEAD: Normocephalic/atraumatic EYES: EOMI ENMT: Mucous membranes moist NECK: supple no meningeal  signs SPINE/BACK:entire spine nontender, no bruising/crepitance/stepoffs noted to spine, diffuse lumbar paraspinal tenderness without bruising CV: S1/S2 noted LUNGS: Lungs are clear to auscultation bilaterally, no apparent distress ABDOMEN: soft, nontender, no rebound or guarding, bowel sounds noted throughout abdomen, easily reducible abdominal hernia GU:no cva tenderness NEURO: Pt is awake/alert chronic dysarthria noted, right upper extremity right lower extremity flaccid at baseline He has appropriate strength and power in  left arm  He has appropriate dorsi and plantar flexion left foot while sitting in wheelchair EXTREMITIES: pulses normal/equal, full ROM, no obvious deformities to extremities SKIN: warm, color normal PSYCH: no abnormalities of mood noted, alert and oriented to situation  ED Treatments / Results  Labs (all labs ordered are listed, but  only abnormal results are displayed) Labs Reviewed  URINALYSIS, ROUTINE W REFLEX MICROSCOPIC - Abnormal; Notable for the following components:      Result Value   Hgb urine dipstick TRACE (*)    Ketones, ur 15 (*)    Protein, ur 100 (*)    All other components within normal limits  BASIC METABOLIC PANEL - Abnormal; Notable for the following components:   Glucose, Bld 108 (*)    BUN 22 (*)    All other components within normal limits  PROTIME-INR - Abnormal; Notable for the following components:   Prothrombin Time 22.9 (*)    All other components within normal limits  URINALYSIS, MICROSCOPIC (REFLEX) - Abnormal; Notable for the following components:   Bacteria, UA RARE (*)    All other components within normal limits  CBC WITH DIFFERENTIAL/PLATELET    EKG None  Radiology Ct Abdomen Pelvis W Contrast  Result Date: 03/24/2018 CLINICAL DATA:  Initial evaluation for low back pain radiating down both legs. History of minor trauma. EXAM: CT ABDOMEN AND PELVIS WITH CONTRAST TECHNIQUE: Multidetector CT imaging of the abdomen and  pelvis was performed using the standard protocol following bolus administration of intravenous contrast. CONTRAST:  117m ISOVUE-300 IOPAMIDOL (ISOVUE-300) INJECTION 61% COMPARISON:  Prior CT from 05/13/2016. FINDINGS: Lower chest: Mild scattered bibasilar atelectatic changes. Small nodular density along the right minor fissure felt to be most consistent with a intra pulmonic lymph node. Visualized lungs are otherwise clear. Hepatobiliary: Liver demonstrates a normal contrast enhanced appearance. Gallbladder within normal limits. No biliary dilatation. Pancreas: Fatty infiltration the pancreas noted. Pancreas otherwise unremarkable without acute inflammatory changes. Spleen: Spleen intact and within normal limits. Adrenals/Urinary Tract: Adrenal glands are normal. Kidneys equal in size with symmetric enhancement. Kidneys demonstrated lobulated contour bilaterally. Few scattered subcentimeter hypodensities too small the characterize, but statistically likely reflects small cyst. No nephrolithiasis, hydronephrosis, or focal enhancing renal mass. No hydroureter. Partially distended bladder within normal limits. Stomach/Bowel: Stomach within normal limits. Punctate calcification at the GE junction noted, of doubtful significance. No evidence for bowel obstruction. Appendix is surgically absent. No acute inflammatory changes seen about the bowels. Vascular/Lymphatic: Normal intravascular enhancement seen throughout the intra-abdominal aorta. No aneurysm. Moderate aorto bi-iliac atherosclerotic disease. Mesenteric vessels are patent proximally. Mildly prominent porta hepatis lymph nodes measure up to 11 mm. 15 mm right inguinal node measures at the upper limits of normal. No other adenopathy within the abdomen and pelvis. Reproductive: Prostate within normal limits. Other: No free intraperitoneal air. No free fluid. No mesenteric or retroperitoneal hematoma. Musculoskeletal: Visualized external soft tissues demonstrate no  acute abnormality. Chronic compression deformities involving the L1 and L2 vertebral bodies are stable from previous. There is a new compression deformity involving the L4 vertebral body, which appears at least partially acute at the inferior endplate. Associated height loss of up to approximately 60% with 5 mm bony retropulsion. IMPRESSION: 1. Probable acute compression fracture involving the L4 vertebral body with up to 60% height loss and 5 mm bony retropulsion. Correlation with physical exam for possible pain at this location recommended. 2. No other acute traumatic injury within the abdomen and pelvis. Electronically Signed   By: BJeannine BogaM.D.   On: 03/24/2018 05:02    Procedures Procedures  Medications Ordered in ED Medications  fentaNYL (SUBLIMAZE) injection 50 mcg (50 mcg Intravenous Given 03/24/18 0221)  ondansetron (ZOFRAN) injection 4 mg (4 mg Intravenous Given 03/24/18 0220)  iopamidol (ISOVUE-300) 61 % injection 100 mL (100 mLs Intravenous Contrast Given  03/24/18 0251)  fentaNYL (SUBLIMAZE) injection 50 mcg (50 mcg Intravenous Given 03/24/18 0523)     Initial Impression / Assessment and Plan / ED Course  I have reviewed the triage vital signs and the nursing notes.  Pertinent labs & imaging results that were available during my care of the patient were reviewed by me and considered in my medical decision making (see chart for details).     2:10 AM Patient with continued neck pain for 2 weeks.  He has already had negative x-ray imaging We will broaden work-up to include labs, urinalysis, CT imaging 5:40 AM CT imaging confirms acute L4 compression fracture. While lying in the bed, he has difficulty with left hip flexion due to back pain. No other acute complaints at this time.  Due to the fact patient has multiple comorbidities, is on anticoagulants, and is already high fall risk with a new compression fracture, I feel inpatient admission, pain control is warranted He is  unlikely to undergo acute surgical management, but would benefit from brace and probable rehab Discussed with Dr. Tamala Julian with [facility] at [facility] Will admit to medical service Final Clinical Impressions(s) / ED Diagnoses   Final diagnoses:  Intractable back pain  Non-traumatic compression fracture of fourth lumbar vertebra, initial encounter [facility])    ED Discharge Orders    None       Ripley Fraise, MD 03/24/18 [phone]

## 2018-03-25 DIAGNOSIS — M549 Dorsalgia, unspecified: Secondary | ICD-10-CM | POA: Diagnosis not present

## 2018-03-25 LAB — COMPREHENSIVE METABOLIC PANEL
ALBUMIN: 3.5 g/dL (ref 3.5–5.0)
ALT: 20 U/L (ref 17–63)
ANION GAP: 9 (ref 5–15)
AST: 18 U/L (ref 15–41)
Alkaline Phosphatase: 85 U/L (ref 38–126)
BUN: 23 mg/dL — AB (ref 6–20)
CALCIUM: 8.9 mg/dL (ref 8.9–10.3)
CO2: 21 mmol/L — AB (ref 22–32)
Chloride: 109 mmol/L (ref 101–111)
Creatinine, Ser: 1.13 mg/dL (ref 0.61–1.24)
GFR calc Af Amer: >60 mL/min (ref >60)
GFR calc non Af Amer: >60 mL/min (ref >60)
GLUCOSE: 143 mg/dL — AB (ref 65–99)
POTASSIUM: 4.1 mmol/L (ref 3.5–5.1)
SODIUM: 139 mmol/L (ref 135–145)
TOTAL PROTEIN: 7.3 g/dL (ref 6.5–8.1)
Total Bilirubin: 0.7 mg/dL (ref 0.3–1.2)

## 2018-03-25 LAB — CBC
HEMATOCRIT: 41.5 % (ref 39.0–52.0)
Hemoglobin: 14.2 g/dL (ref 13.0–17.0)
MCH: 30.5 pg (ref 26.0–34.0)
MCHC: 34.2 g/dL (ref 30.0–36.0)
MCV: 89.2 fL (ref 78.0–100.0)
Platelets: 166 10*3/uL (ref 150–400)
RBC: 4.65 MIL/uL (ref 4.22–5.81)
RDW: 13.2 % (ref 11.5–15.5)
WBC: 8 10*3/uL (ref 4.0–10.5)

## 2018-03-25 LAB — GLUCOSE, CAPILLARY
GLUCOSE-CAPILLARY: 150 mg/dL — AB (ref 65–99)
GLUCOSE-CAPILLARY: 147 mg/dL — AB (ref 65–99)
GLUCOSE-CAPILLARY: 112 mg/dL — AB (ref 65–99)
Glucose-Capillary: 121 mg/dL — ABNORMAL HIGH (ref 65–99)

## 2018-03-25 LAB — PROTIME-INR
INR: 2.39
PROTHROMBIN TIME: 25.9 s — AB (ref 11.4–15.2)

## 2018-03-25 MED ORDER — OXYCODONE HCL 5 MG PO TABS
10.0000 mg | ORAL_TABLET | Freq: Four times a day (QID) | ORAL | Status: DC | PRN
  Administered 2018-03-25 – 2018-03-26 (×3): 15 mg via ORAL
  Filled 2018-03-25 (×3): qty 3

## 2018-03-25 MED ORDER — WARFARIN SODIUM 2.5 MG PO TABS
2.5000 mg | ORAL_TABLET | Freq: Once | ORAL | Status: AC
  Administered 2018-03-25: 2.5 mg via ORAL
  Filled 2018-03-25: qty 1

## 2018-03-25 NOTE — Evaluation (Signed)
Occupational Therapy Evaluation Patient Details Name: Dakota Hall MRN: [id number] DOB: 1951-10-22 Today's Date: 03/25/2018    History of Present Illness pt was admitted with compression fx of L4.  PMH:  CVA with right hemiplegia, DM, OSA   Clinical Impression   This 67 year old man was admitted for the above.  At baseline, he was active and mod I for adls.  He now has TLSO and back precautions and needs more assistance with adls and mobility.  Used hemiwalker during evaluation and this is what he uses at home. His wife works during the day. He will has a w/c but will need to be mod I with toileting. Pt had spillage when using urinal in bed today. Will follow in acute setting focusing on these areas as pt is refusing snf due to a bad experience. ? If cousin across the street could help him when wife is working    Follow Up Recommendations  Supervision/Assistance - 24 hour;Home health OT;SNF(pt is refusing snf; had a bad experience)    Equipment Recommendations  None recommended by OT    Recommendations for Other Services       Precautions / Restrictions Precautions Precautions: Fall;Back Precaution Comments: R hemiplegia.  Used hemiwalker Required Braces or Orthoses: Spinal Brace Restrictions Weight Bearing Restrictions: No      Mobility Bed Mobility Overal bed mobility: Needs Assistance Bed Mobility: Rolling;Sidelying to Sit Rolling: Mod assist Sidelying to sit: Mod assist       General bed mobility comments: rolled to R; unable to use R side to push up from.  Pt has a hospital bed at home  Transfers Overall transfer level: Needs assistance Equipment used: Rolling walker (2 wheeled) Transfers: Sit to/from [employer] Sit to Stand: Min assist;Mod assist;+2 safety/equipment Stand pivot transfers: Min assist;+2 safety/equipment       General transfer comment: performed sit to stand twice. Pt had difficulty gaining balance; needed assistance to help get R  foot under him and assist to rise and stabilize. Pt with TLSO on and this changes his mechanics    Balance                                           ADL either performed or assessed with clinical judgement   ADL Overall ADL's : Needs assistance/impaired Eating/Feeding: Modified independent;Sitting   Grooming: Modified independent;Sitting   Upper Body Bathing: Minimal assistance;Sitting   Lower Body Bathing: Moderate assistance;+2 for safety/equipment;Sit to/from stand   Upper Body Dressing : Minimal assistance;Sitting(total A for brace)   Lower Body Dressing: Maximal assistance;+2 for safety/equipment;Sit to/from stand   Toilet Transfer: Minimal assistance;+2 for safety/equipment;Stand-pivot;RW(chair)   Toileting- Clothing Manipulation and Hygiene: Minimal assistance;Sit to/from stand;+2 for safety/equipment         General ADL Comments: pt typically props leg up and bends to reach feet. Pt had spillage on bedpad from using urinal     Vision         Perception     Praxis      Pertinent Vitals/Pain Pain Assessment: Faces Faces Pain Scale: Hurts little more Pain Location: back Pain Descriptors / Indicators: Aching Pain Intervention(s): Limited activity within patient's tolerance;Monitored during session     Hand Dominance Right   Extremity/Trunk Assessment Upper Extremity Assessment Upper Extremity Assessment: RUE deficits/detail(non-functional; spasms)           Communication Communication  Communication: No difficulties   Cognition Arousal/Alertness: Awake/alert Behavior During Therapy: WFL for tasks assessed/performed Overall Cognitive Status: Within Functional Limits for tasks assessed                                     General Comments  transfer was very effortful for pt.  Dyspnea 2/4 after getting to chair    Exercises     Shoulder Instructions      Home Living Family/patient expects to be discharged to::  Private residence                   Bathroom Shower/Tub: Walk-in shower   Bathroom Toilet: Handicapped height     Home Equipment: Shower seat;Hospital bed(hemiwalker)   Additional Comments: has help at night (wife). cousin lives across the street.  Pt can have a first floor set up.  His hospital bed is upstairs (has trapeze); pt used rail to get up stairs and bumped down on his bottom using a large spool to lower himself to stair level      Prior Functioning/Environment Level of Independence: Independent with assistive device(s)        Comments: uses hemiwalker; gardens, cooks, washes clothes        OT Problem List: Decreased strength;Decreased activity tolerance;Impaired balance (sitting and/or standing);Pain;Decreased knowledge of use of DME or AE;Decreased knowledge of precautions      OT Treatment/Interventions: Self-care/ADL training;DME and/or AE instruction;Therapeutic activities;Patient/family education;Energy conservation    OT Goals(Current goals can be found in the care plan section) Acute Rehab OT Goals Patient Stated Goal: home OT Goal Formulation: With patient Time For Goal Achievement: 04/08/18 Potential to Achieve Goals: Good ADL Goals Pt Will Transfer to Toilet: with min guard assist;bedside commode;stand pivot transfer(and use urinal from bed/chair with set up/no spillage) Pt Will Perform Toileting - Clothing Manipulation and hygiene: with min guard assist;sit to/from stand Additional ADL Goal #1: pt will peform bed mobility from [facility] raised, with TLSO on following back precautions at supervision level  OT Frequency: Min 2X/week   Barriers to D/C:            Co-evaluation PT/OT/SLP Co-Evaluation/Treatment: Yes Reason for Co-Treatment: Complexity of the patient's impairments (multi-system involvement) PT goals addressed during session: Mobility/safety with mobility OT goals addressed during session: ADL's and self-care      AM-PAC PT "6  Clicks" Daily Activity     Outcome Measure Help from another person eating meals?: None Help from another person taking care of personal grooming?: A Little Help from another person toileting, which includes using toliet, bedpan, or urinal?: A Lot Help from another person bathing (including washing, rinsing, drying)?: A Lot Help from another person to put on and taking off regular upper body clothing?: A Little Help from another person to put on and taking off regular lower body clothing?: A Lot 6 Click Score: 16   End of Session    Activity Tolerance: Patient tolerated treatment well Patient left: in chair;with call bell/phone within reach;with chair alarm set  OT Visit Diagnosis: Unsteadiness on feet (R26.81);Hemiplegia and hemiparesis                Time: 0935-1007 OT Time Calculation (min): 32 min Charges:  OT General Charges $OT Visit: 1 Visit OT Evaluation $OT Eval Moderate Complexity: 1 Mod G-Codes:     [place], OTR/L [phone] 03/25/2018  Marji Kuehnel 03/25/2018, 12:42 PM

## 2018-03-25 NOTE — Evaluation (Signed)
Physical Therapy Evaluation Patient Details Name: Dakota Hall MRN: [id number] DOB: 04-15-1951 Today's Date: 03/25/2018   History of Present Illness  pt was admitted with compression fx of L4.  PMH:  CVA with right hemiplegia, DM, OSA  Clinical Impression  The patient is  Requiring  Assistance for mobility and donning  TLSO. The patient was quite modified independent PTA, ambulated with hemiwalker or used manual WC.and had been able to negotiate 13 steps in a modified way, scooting down on buttocks. The patient's wife works. Pt admitted with above diagnosis. Pt currently with functional limitations due to the deficits listed below (see PT Problem List).  Pt will benefit from skilled PT to increase their independence and safety with mobility to allow discharge to the venue listed below.       Follow Up Recommendations SNF but patient is declining,;Home health PT if DC to home  But will require 24/7 assist, wife works.    Equipment Recommendations  None recommended by PT    Recommendations for Other Services       Precautions / Restrictions Precautions Precautions: Fall;Back Precaution Comments: R hemiplegia.  Used hemiwalker Required Braces or Orthoses: Spinal Brace Spinal Brace: Thoracolumbosacral orthotic;Applied in sitting position Restrictions Weight Bearing Restrictions: No      Mobility  Bed Mobility Overal bed mobility: Needs Assistance Bed Mobility: Rolling;Sidelying to Sit Rolling: Mod assist Sidelying to sit: Mod assist       General bed mobility comments: rolled to R; unable to use R side to push up from.  Pt has a hospital bed at home  Transfers Overall transfer level: Needs assistance Equipment used: Hemi-walker Transfers: Sit to/from [employer] Transfers Sit to Stand: Mod assist;+2 safety/equipment;+2 physical assistance Stand pivot transfers: Min assist;+2 safety/equipment;Mod assist       General transfer comment: performed sit to stand twice. Pt  had difficulty gaining balance; needed assistance to help get R foot under him and assist to rise and stabilize. Pt with TLSO on and this changes his mechanics  Ambulation/Gait                Stairs            Wheelchair Mobility    Modified Rankin (Stroke Patients Only)       Balance Overall balance assessment: History of Falls;Needs assistance Sitting-balance support: Single extremity supported;Feet supported Sitting balance-Leahy Scale: Fair     Standing balance support: Single extremity supported;During functional activity Standing balance-Leahy Scale: Poor                               Pertinent Vitals/Pain Pain Assessment: Faces Faces Pain Scale: Hurts little more Pain Location: back Pain Descriptors / Indicators: Aching Pain Intervention(s): Limited activity within patient's tolerance;Monitored during session    Home Living Family/patient expects to be discharged to:: Private residence       Home Access: Ramped entrance     Home Layout: Two level Home Equipment: Shower seat;Hospital bed(hemiwalker) Additional Comments: has help at night (wife). cousin lives across the street.  Pt can have a first floor set up.  His hospital bed is upstairs (has trapeze); pt used rail to get up stairs and bumped down on his bottom using a large spool to lower himself to stair level    Prior Function Level of Independence: Independent with assistive device(s)         Comments: uses hemiwalker; gardens, cooks, washes clothes  Hand Dominance   Dominant Hand: Right    Extremity/Trunk Assessment   Upper Extremity Assessment Upper Extremity Assessment: Defer to OT evaluation    Lower Extremity Assessment Lower Extremity Assessment: RLE deficits/detail RLE Deficits / Details: significant increased extensor tone  with mobility    Cervical / Trunk Assessment Cervical / Trunk Assessment: Normal  Communication   Communication: No difficulties   Cognition Arousal/Alertness: Awake/alert Behavior During Therapy: WFL for tasks assessed/performed Overall Cognitive Status: Within Functional Limits for tasks assessed                                        General Comments General comments (skin integrity, edema, etc.): transfer was very effortful for pt.  Dyspnea 2/4 after getting to chair    Exercises     Assessment/Plan    PT Assessment Patient needs continued PT services  PT Problem List Decreased strength;Impaired tone;Decreased range of motion;Decreased knowledge of use of DME;Decreased activity tolerance;Decreased safety awareness;Decreased knowledge of precautions;Decreased mobility;Decreased coordination;Pain       PT Treatment Interventions DME instruction;Functional mobility training;Therapeutic activities;Patient/family education;Balance training    PT Goals (Current goals can be found in the Care Plan section)  Acute Rehab PT Goals Patient Stated Goal: home PT Goal Formulation: With patient Time For Goal Achievement: 04/08/18 Potential to Achieve Goals: Fair    Frequency Min 3X/week   Barriers to discharge Decreased caregiver support      Co-evaluation PT/OT/SLP Co-Evaluation/Treatment: Yes Reason for Co-Treatment: Complexity of the patient's impairments (multi-system involvement) PT goals addressed during session: Mobility/safety with mobility OT goals addressed during session: ADL's and self-care       AM-PAC PT "6 Clicks" Daily Activity  Outcome Measure Difficulty turning over in bed (including adjusting bedclothes, sheets and blankets)?: Unable Difficulty moving from lying on back to sitting on the side of the bed? : Unable Difficulty sitting down on and standing up from a chair with arms (e.g., wheelchair, bedside commode, etc,.)?: Unable Help needed moving to and from a bed to chair (including a wheelchair)?: Total Help needed walking in hospital room?: Total Help needed climbing  3-5 steps with a railing? : Total 6 Click Score: 6    End of Session Equipment Utilized During Treatment: Gait belt Activity Tolerance: Patient tolerated treatment well Patient left: in chair;with call bell/phone within reach;with chair alarm set Nurse Communication: Mobility status PT Visit Diagnosis: Unsteadiness on feet (R26.81);Hemiplegia and hemiparesis;Pain Hemiplegia - dominant/non-dominant: Dominant Hemiplegia - caused by: Unspecified    Time: 0960-4540 PT Time Calculation (min) (ACUTE ONLY): 37 min   Charges:   PT Evaluation $PT Eval Moderate Complexity: 1 Mod     PT G CodesBlanchard Kelch PT [phone]   Rada Hay 03/25/2018, 12:58 PM

## 2018-03-25 NOTE — Clinical Social Work Note (Signed)
Clinical Social Work Assessment  Patient Details  Name: Dakota Hall MRN: [id number] Date of Birth: 1951-01-25  Date of referral:  03/25/18               Reason for consult:  Facility Placement                Permission sought to share information with:  Family Supports, Case Manager Permission granted to share information::  Yes, Verbal Permission Granted  Name::     [name]  Agency::     Relationship::  Spouse  Contact Information:     Housing/Transportation Living arrangements for the past 2 months:  Skilled Building surveyor of Information:  Patient Patient Interpreter Needed:  None Criminal Activity/Legal Involvement Pertinent to Current Situation/Hospitalization:  No - Comment as needed Significant Relationships:  Spouse Lives with:  Spouse Do you feel safe going back to the place where you live?  Yes Need for family participation in patient care:  Yes (Comment)  Care giving concerns:  Patient reports that he is home alone during the day when his spouse is at work.    Social Worker assessment / plan:  LCSW consulted for disposition.   Patient admitted for compression fracture.   PT recommends HH or SNF. Patient states he is not interested in SNF and prefers to go home with home health.   According to patient he uses a hemi walker at home. Patient states he needs assistance with bathing and dressing at home. Patient reports that he has stairs in his home and that he is able to navigate the stairs in his home. Patient does not drive.     Employment status:  Retired Database administrator PT Recommendations:  Skilled Holiday representative, Home with Home Health Information / Referral to community resources:     Patient/Family's Response to care:  Patient thankful for [employer].   Patient/Family's Understanding of and Emotional Response to Diagnosis, Current Treatment, and Prognosis:  Patient expressd understanding of current diagnosis. Patient  states that it may be painful when he goes home, but he feels he can manage. Patient reports that his wife helps and supports him at home. Patient prefers HH over short term rehab at [facility].   Emotional Assessment Appearance:  Appears stated age Attitude/Demeanor/Rapport:    Affect (typically observed):  Accepting, Calm Orientation:  Oriented to Self, Oriented to Place, Oriented to  Time, Oriented to Situation Alcohol / Substance use:  Not Applicable Psych involvement (Current and /or in the community):  No (Comment)  Discharge Needs  Concerns to be addressed:  No discharge needs identified Readmission within the last 30 days:  No Current discharge risk:  None Barriers to Discharge:  Continued Medical Work up   [employer], LCSW 03/25/2018, 4:28 PM

## 2018-03-25 NOTE — Care Management Obs Status (Signed)
MEDICARE OBSERVATION STATUS NOTIFICATION   Patient Details  Name: Dakota Hall MRN: [id number] Date of Birth: [birth year]   Medicare Observation Status Notification Given:  Yes    Golda Acre, RN 03/25/2018, 11:57 AM

## 2018-03-25 NOTE — Progress Notes (Addendum)
ANTICOAGULATION CONSULT NOTE Pharmacy Consult for Warfarin Indication: history of PE, stroke  Allergies  Allergen Reactions  . Penicillins Hives and Other (See Comments)    Has patient had a PCN reaction causing immediate rash, facial/tongue/throat swelling, SOB or lightheadedness with hypotension: no Has patient had a PCN reaction causing severe rash involving mucus membranes or skin necrosis: no Has patient had a PCN reaction that required hospitalization: no Has patient had a PCN reaction occurring within the last 10 years: no If all of the above answers are "NO", then may proceed with Cephalosporin use.     Patient Measurements: Height:  (170.2 cm) Weight: 254 lb 6.6 oz (115.4 kg) IBW/kg (Calculated) : 66.1  Vital Signs: Temp: 98.6 F (37 C) (05/09 1350) Temp Source: Oral (05/09 0419) BP: 166/72 (05/09 1350) Pulse Rate: 83 (05/09 1350)  Labs: Recent Labs    03/24/18 0210 03/25/18 0549  HGB 16.4 14.2  HCT 46.4 41.5  PLT 177 166  LABPROT 22.9* 25.9*  INR 2.04 2.39  CREATININE 1.21 1.13    Estimated Creatinine Clearance: 78 mL/min (by C-G formula based on SCr of 1.13 mg/dL).   Assessment: 89 y/oM on warfarin for history of stroke and PE admitted on 03/24/18 with lower back pain secondary to L4 compression fracture. Pharmacy consulted to assist with dosing of warfarin while patient admitted.   Baseline INR therapeutic  Prior anticoagulation: warfarin  on M/W and 2.5mg  all other days of the week, LD 5/7  Significant events:  5/8: Patient received a dose of warfarin 2.5 mg PO at [facility] per [facility]  Today, 03/25/2018:  CBC: wnl  INR remains therapeutic, increased  Major drug interactions: glucocorticoids may potentiate warfarin's effect on INR  No bleeding issues per nursing  Eating 90% of meals  Goal of Therapy: INR 2-3  Plan:  Warfarin 2.5 mg PO tonight at 18:00, per home regimen  Daily INR  CBC at least q72 hr while on  warfarin  Monitor for signs of bleeding or thrombosis   Bernadene Person, PharmD, BCPS [phone] 03/25/2018, 2:29 PM

## 2018-03-25 NOTE — Progress Notes (Signed)
PROGRESS NOTE    Dakota Hall  ZOX:[id number] DOB: [birth year] DOA: 03/23/2018 PCP: Lorenda Ishihara, MD    Brief Narrative:  Sent over from med center [facility] 5/8 a.m. secondary to continued pain CT scan done showed acute L4 compression fracture and difficulty with left hip flexion   Assessment & Plan:   Active Problems:   Compression fracture of L4 lumbar vertebra, closed, initial encounter (HCC)/Lumbago - Agree with brace - Pt reports pain is not well controlled.  I will place patient on oxycodone IR and discontinue Norco  Hypertension-continue amlodipine 10 mg, hydralazine and Imdur--pressure stable at this time-I have held off on the patient's Lasix in addition to ARB to be resumed in a.m.  Diabetes mellitus - continue current regimen. Pt is on SSI and diabetic diet.   Massive PE -  Coumadin per pharmacy  Bipolar -continue Abilify and other meds-Zoloft   DVT prophylaxis: Patient is on Coumadin Code Status: Full Family Communication: Bedside Disposition Plan: Discharged home with improved pain control   Consultants:   none   Procedures: None   Antimicrobials: None   Subjective: Pt has no new complaints. No acute issues overnight.   Objective: Vitals:   03/24/18 1439 03/24/18 2055 03/25/18 0419 03/25/18 1350  BP: 138/66 (!) 173/76 (!) 151/67 (!) 166/72  Pulse: 75 80 79 83  Resp: 20 (!) 29 (!) 22 17  Temp: 98.7 F (37.1 C) 98.7 F (37.1 C) 98.3 F (36.8 C) 98.6 F (37 C)  TempSrc: Oral Oral Oral   SpO2: 92% 93% 94% 95%  Weight: 115.4 kg (254 lb 6.6 oz)     Height:  (1.702 m)       Intake/Output Summary (Last 24 hours) at 03/25/2018 1557 Last data filed at 03/25/2018 0930 Gross per 24 hour  Intake 480 ml  Output 1750 ml  Net -1270 ml   Filed Weights   03/23/18 2013 03/24/18 1439  Weight: 111.6 kg (246 lb) 115.4 kg (254 lb 6.6 oz)    Examination:  General exam: Appears calm and comfortable, in nad.  Respiratory system:  Clear to auscultation. Respiratory effort normal. Equal chest rise.  Cardiovascular system: S1 & S2 heard, RRR. No JVD, murmurs, rubs, gallops  Gastrointestinal system: Abdomen is nondistended, soft and nontender. No organomegaly or masses felt. Normal bowel sounds heard. Central nervous system: Alert and oriented. No focal neurological deficits. Extremities: warm, + pulses Skin: No rashes, lesions or ulcers, on limited exam. Psychiatry:  Mood & affect appropriate.     Data Reviewed: I have personally reviewed following labs and imaging studies  CBC: Recent Labs  Lab 03/24/18 0210 03/25/18 0549  WBC 7.9 8.0  NEUTROABS 5.0  --   HGB 16.4 14.2  HCT 46.4 41.5  MCV 88.4 89.2  PLT 177 166   Basic Metabolic Panel: Recent Labs  Lab 03/24/18 0210 03/25/18 0549  NA 140 139  K 4.1 4.1  CL 108 109  CO2 22 21*  GLUCOSE 108* 143*  BUN 22* 23*  CREATININE 1.21 1.13  CALCIUM 9.4 8.9   GFR: Estimated Creatinine Clearance: 78 mL/min (by C-G formula based on SCr of 1.13 mg/dL). Liver Function Tests: Recent Labs  Lab 03/25/18 0549  AST 18  ALT 20  ALKPHOS 85  BILITOT 0.7  PROT 7.3  ALBUMIN 3.5   No results for input(s): LIPASE, AMYLASE in the last 168 hours. No results for input(s): AMMONIA in the last 168 hours. Coagulation Profile: Recent Labs  Lab 03/24/18 0210  03/25/18 0549  INR 2.04 2.39   Cardiac Enzymes: No results for input(s): CKTOTAL, CKMB, CKMBINDEX, TROPONINI in the last 168 hours. BNP (last 3 results) No results for input(s): PROBNP in the last 8760 hours. HbA1C: No results for input(s): HGBA1C in the last 72 hours. CBG: Recent Labs  Lab 03/24/18 1227 03/24/18 1734 03/24/18 2149 03/25/18 0736 03/25/18 1138  GLUCAP 75 109* 134* 121* 150*   Lipid Profile: No results for input(s): CHOL, HDL, LDLCALC, TRIG, CHOLHDL, LDLDIRECT in the last 72 hours. Thyroid Function Tests: No results for input(s): TSH, T4TOTAL, FREET4, T3FREE, THYROIDAB in the last  72 hours. Anemia Panel: No results for input(s): VITAMINB12, FOLATE, FERRITIN, TIBC, IRON, RETICCTPCT in the last 72 hours. Sepsis Labs: No results for input(s): PROCALCITON, LATICACIDVEN in the last 168 hours.  No results found for this or any previous visit (from the past 240 hour(s)).       Radiology Studies: Ct Abdomen Pelvis W Contrast  Result Date: 03/24/2018 CLINICAL DATA:  Initial evaluation for low back pain radiating down both legs. History of minor trauma. EXAM: CT ABDOMEN AND PELVIS WITH CONTRAST TECHNIQUE: Multidetector CT imaging of the abdomen and pelvis was performed using the standard protocol following bolus administration of intravenous contrast. CONTRAST:  ISOVUE-300 IOPAMIDOL (ISOVUE-300) INJECTION 61% COMPARISON:  Prior CT from 05/13/2016. FINDINGS: Lower chest: Mild scattered bibasilar atelectatic changes. Small nodular density along the right minor fissure felt to be most consistent with a intra pulmonic lymph node. Visualized lungs are otherwise clear. Hepatobiliary: Liver demonstrates a normal contrast enhanced appearance. Gallbladder within normal limits. No biliary dilatation. Pancreas: Fatty infiltration the pancreas noted. Pancreas otherwise unremarkable without acute inflammatory changes. Spleen: Spleen intact and within normal limits. Adrenals/Urinary Tract: Adrenal glands are normal. Kidneys equal in size with symmetric enhancement. Kidneys demonstrated lobulated contour bilaterally. Few scattered subcentimeter hypodensities too small the characterize, but statistically likely reflects small cyst. No nephrolithiasis, hydronephrosis, or focal enhancing renal mass. No hydroureter. Partially distended bladder within normal limits. Stomach/Bowel: Stomach within normal limits. Punctate calcification at the GE junction noted, of doubtful significance. No evidence for bowel obstruction. Appendix is surgically absent. No acute inflammatory changes seen about the bowels.  Vascular/Lymphatic: Normal intravascular enhancement seen throughout the intra-abdominal aorta. No aneurysm. Moderate aorto bi-iliac atherosclerotic disease. Mesenteric vessels are patent proximally. Mildly prominent porta hepatis lymph nodes measure up to 11 mm. 15 mm right inguinal node measures at the upper limits of normal. No other adenopathy within the abdomen and pelvis. Reproductive: Prostate within normal limits. Other: No free intraperitoneal air. No free fluid. No mesenteric or retroperitoneal hematoma. Musculoskeletal: Visualized external soft tissues demonstrate no acute abnormality. Chronic compression deformities involving the L1 and L2 vertebral bodies are stable from previous. There is a new compression deformity involving the L4 vertebral body, which appears at least partially acute at the inferior endplate. Associated height loss of up to approximately 60% with 5 mm bony retropulsion. IMPRESSION: 1. Probable acute compression fracture involving the L4 vertebral body with up to 60% height loss and 5 mm bony retropulsion. Correlation with physical exam for possible pain at this location recommended. 2. No other acute traumatic injury within the abdomen and pelvis. Electronically Signed   By: Rise Mu M.D.   On: 03/24/2018 05:02        Scheduled Meds: . amLODipine  10 mg Oral Daily  . ARIPiprazole  2.5 mg Oral Daily  . hydrALAZINE  25 mg Oral BID  . insulin aspart  0-9 Units  Subcutaneous TID WC  . irbesartan  37.5 mg Oral Daily  . isosorbide mononitrate  30 mg Oral Daily  . pioglitazone  7.5 mg Oral Daily  . predniSONE  40 mg Oral QAC breakfast  . sertraline  250 mg Oral Daily  . warfarin  2.5 mg Oral ONCE-1800  . Warfarin - Pharmacist Dosing Inpatient   Does not apply q1800   Continuous Infusions:   LOS: 0 days    Time spent: > 35 minutes  Penny Pia, MD [facility] Pager [phone]  If 7PM-7AM, please contact  night-coverage [email or web address] Password [facility] 03/25/2018, 3:57 PM

## 2018-03-26 DIAGNOSIS — M549 Dorsalgia, unspecified: Secondary | ICD-10-CM | POA: Diagnosis not present

## 2018-03-26 LAB — PROTIME-INR
INR: 3.04
PROTHROMBIN TIME: 31.2 s — AB (ref 11.4–15.2)

## 2018-03-26 LAB — GLUCOSE, CAPILLARY
GLUCOSE-CAPILLARY: 164 mg/dL — AB (ref 65–99)
Glucose-Capillary: 92 mg/dL (ref 65–99)

## 2018-03-26 MED ORDER — OXYCODONE HCL 10 MG PO TABS: 10.0000 mg | ORAL_TABLET | Freq: Four times a day (QID) | ORAL | 0 refills | Status: AC | PRN

## 2018-03-26 NOTE — Discharge Summary (Signed)
Physician Discharge Summary  Dakota Hall VXB:[id number] DOB: 1951-04-16 DOA: 03/23/2018  PCP: Leeroy Cha, MD  Admit date: 03/23/2018 Discharge date: 03/26/2018  Time spent: 35  minutes  Recommendations for Outpatient Follow-up:  1. Continue pain management   Discharge Diagnoses:  Active Problems:   Compression fracture of L4 lumbar vertebra, closed, initial encounter ([facility])   Lumbago   Discharge Condition: Stable  Diet recommendation: Carb modified diet  Filed Weights   03/23/18 2013 03/24/18 1439  Weight: 111.6 kg (246 lb) 115.4 kg (254 lb 6.6 oz)    History of present illness:  Sent over from med center [facility] 5/8 a.m. secondary to continued pain CT scan done showed acute L4 compression fracture and difficulty with left hip flexion  Hospital Course:  Active Problems:   Compression fracture of L4 lumbar vertebra, closed, initial encounter (HCC)/Lumbago - Agree with brace -And on discharging patient on pain medication which helped here at the hospital.  He may uses tramadol for breakthrough pain.  He will need to follow-up with his primary care physician for continued pain management past the acute phase he is currently in. -Placed order for home health physical therapy  Hypertension -we will continue prior to admission medication regimen  Diabetes mellitus -Continue prior to admission medication regimen  Massive PE -  Coumadin as per prior to admission dosing  Bipolar -continue Abilify and other meds-Zoloft    Procedures:  None  Consultations:  None  Discharge Exam: Vitals:   03/26/18 0451 03/26/18 1442  BP: (!) 196/95 (!) 142/68  Pulse: 72 80  Resp: 18 17  Temp: 98.4 F (36.9 C) 98.4 F (36.9 C)  SpO2: 97% 96%    General: Patient in no acute distress, alert and awake Cardiovascular: Regular rate and rhythm, no murmurs or rubs Respiratory: No increased work of breathing, no wheezes  Discharge Instructions   Discharge  Instructions    Call MD for:  severe uncontrolled pain   Complete by:  As directed    Diet - low sodium heart healthy   Complete by:  As directed    Discharge instructions   Complete by:  As directed    Please follow-up with your primary care physician for further evaluation recommendations and continued pain management.   Increase activity slowly   Complete by:  As directed      Allergies as of 03/26/2018      Reactions   Penicillins Hives, Other (See Comments)   Has patient had a PCN reaction causing immediate rash, facial/tongue/throat swelling, SOB or lightheadedness with hypotension: no Has patient had a PCN reaction causing severe rash involving mucus membranes or skin necrosis: no Has patient had a PCN reaction that required hospitalization: no Has patient had a PCN reaction occurring within the last 10 years: no If all of the above answers are "NO", then may proceed with Cephalosporin use.      Medication List    STOP taking these medications   acetaminophen 325 MG tablet Commonly known as:  TYLENOL     TAKE these medications   amLODipine 10 MG tablet Commonly known as:  NORVASC Take 10 mg by mouth daily.   ARIPiprazole 5 MG tablet Commonly known as:  ABILIFY Take 2.5 mg by mouth daily.   baclofen 20 MG tablet Commonly known as:  LIORESAL Take 1 tablet (20 mg total) by mouth 3 (three) times daily. What changed:  when to take this   BENGAY EX Apply 1 application topically as needed (  For pain.).   docusate sodium 100 MG capsule Commonly known as:  COLACE Take 100 mg by mouth every Monday, Wednesday, and Friday.   furosemide 20 MG tablet Commonly known as:  LASIX Take 20 mg by mouth daily.   hydrALAZINE 25 MG tablet Commonly known as:  APRESOLINE Take 25 mg by mouth 2 (two) times daily.   isosorbide mononitrate 30 MG 24 hr tablet Commonly known as:  IMDUR Take 30 mg by mouth daily.   olmesartan 5 MG tablet Commonly known as:  BENICAR Take 5 mg by  mouth daily.   ondansetron 4 MG tablet Commonly known as:  ZOFRAN Take 4 mg by mouth every 6 (six) hours as needed for nausea or vomiting.   ONE TOUCH ULTRA 2 w/Device Kit   ONE TOUCH ULTRA TEST test strip Generic drug:  glucose blood   ONETOUCH DELICA LANCETS 14E Misc   Oxycodone HCl 10 MG Tabs Take 1 tablet (10 mg total) by mouth every 6 (six) hours as needed for up to 5 days for moderate pain.   pioglitazone 15 MG tablet Commonly known as:  ACTOS Take 7.5 mg by mouth daily.   potassium chloride SA 20 MEQ tablet Commonly known as:  K-DUR,KLOR-CON Take 20 mEq by mouth daily.   sertraline 100 MG tablet Commonly known as:  ZOLOFT Take 250 mg by mouth daily.   simvastatin 20 MG tablet Commonly known as:  ZOCOR Take 1 tablet (20 mg total) by mouth daily at 6 PM.   traMADol 50 MG tablet Commonly known as:  ULTRAM Take 50 mg by mouth 4 (four) times daily as needed (For pain.).   valsartan 80 MG tablet Commonly known as:  DIOVAN Take 80 mg by mouth daily.   Vitamin D (Ergocalciferol) 50000 units Caps capsule Commonly known as:  DRISDOL Take 50,000 Units by mouth every Friday.   warfarin 2.5 MG tablet Commonly known as:  COUMADIN Take 2.5-5 mg by mouth daily. Take two tablets on Monday and Wednesday and one tablet the rest of the week.      Allergies  Allergen Reactions  . Penicillins Hives and Other (See Comments)    Has patient had a PCN reaction causing immediate rash, facial/tongue/throat swelling, SOB or lightheadedness with hypotension: no Has patient had a PCN reaction causing severe rash involving mucus membranes or skin necrosis: no Has patient had a PCN reaction that required hospitalization: no Has patient had a PCN reaction occurring within the last 10 years: no If all of the above answers are "NO", then may proceed with Cephalosporin use.       The results of significant diagnostics from this hospitalization (including imaging, microbiology,  ancillary and laboratory) are listed below for reference.    Significant Diagnostic Studies: Ct Abdomen Pelvis W Contrast  Result Date: 03/24/2018 CLINICAL DATA:  Initial evaluation for low back pain radiating down both legs. History of minor trauma. EXAM: CT ABDOMEN AND PELVIS WITH CONTRAST TECHNIQUE: Multidetector CT imaging of the abdomen and pelvis was performed using the standard protocol following bolus administration of intravenous contrast. CONTRAST:  166m ISOVUE-300 IOPAMIDOL (ISOVUE-300) INJECTION 61% COMPARISON:  Prior CT from 05/13/2016. FINDINGS: Lower chest: Mild scattered bibasilar atelectatic changes. Small nodular density along the right minor fissure felt to be most consistent with a intra pulmonic lymph node. Visualized lungs are otherwise clear. Hepatobiliary: Liver demonstrates a normal contrast enhanced appearance. Gallbladder within normal limits. No biliary dilatation. Pancreas: Fatty infiltration the pancreas noted. Pancreas otherwise unremarkable without acute  inflammatory changes. Spleen: Spleen intact and within normal limits. Adrenals/Urinary Tract: Adrenal glands are normal. Kidneys equal in size with symmetric enhancement. Kidneys demonstrated lobulated contour bilaterally. Few scattered subcentimeter hypodensities too small the characterize, but statistically likely reflects small cyst. No nephrolithiasis, hydronephrosis, or focal enhancing renal mass. No hydroureter. Partially distended bladder within normal limits. Stomach/Bowel: Stomach within normal limits. Punctate calcification at the GE junction noted, of doubtful significance. No evidence for bowel obstruction. Appendix is surgically absent. No acute inflammatory changes seen about the bowels. Vascular/Lymphatic: Normal intravascular enhancement seen throughout the intra-abdominal aorta. No aneurysm. Moderate aorto bi-iliac atherosclerotic disease. Mesenteric vessels are patent proximally. Mildly prominent porta hepatis  lymph nodes measure up to 11 mm. 15 mm right inguinal node measures at the upper limits of normal. No other adenopathy within the abdomen and pelvis. Reproductive: Prostate within normal limits. Other: No free intraperitoneal air. No free fluid. No mesenteric or retroperitoneal hematoma. Musculoskeletal: Visualized external soft tissues demonstrate no acute abnormality. Chronic compression deformities involving the L1 and L2 vertebral bodies are stable from previous. There is a new compression deformity involving the L4 vertebral body, which appears at least partially acute at the inferior endplate. Associated height loss of up to approximately 60% with 5 mm bony retropulsion. IMPRESSION: 1. Probable acute compression fracture involving the L4 vertebral body with up to 60% height loss and 5 mm bony retropulsion. Correlation with physical exam for possible pain at this location recommended. 2. No other acute traumatic injury within the abdomen and pelvis. Electronically Signed   By: Jeannine Boga M.D.   On: 03/24/2018 05:02    Microbiology: No results found for this or any previous visit (from the past 240 hour(s)).   Labs: Basic Metabolic Panel: Recent Labs  Lab 03/24/18 0210 03/25/18 0549  NA 140 139  K 4.1 4.1  CL 108 109  CO2 22 21*  GLUCOSE 108* 143*  BUN 22* 23*  CREATININE 1.21 1.13  CALCIUM 9.4 8.9   Liver Function Tests: Recent Labs  Lab 03/25/18 0549  AST 18  ALT 20  ALKPHOS 85  BILITOT 0.7  PROT 7.3  ALBUMIN 3.5   No results for input(s): LIPASE, AMYLASE in the last 168 hours. No results for input(s): AMMONIA in the last 168 hours. CBC: Recent Labs  Lab 03/24/18 0210 03/25/18 0549  WBC 7.9 8.0  NEUTROABS 5.0  --   HGB 16.4 14.2  HCT 46.4 41.5  MCV 88.4 89.2  PLT 177 166   Cardiac Enzymes: No results for input(s): CKTOTAL, CKMB, CKMBINDEX, TROPONINI in the last 168 hours. BNP: BNP (last 3 results) No results for input(s): BNP in the last 8760  hours.  ProBNP (last 3 results) No results for input(s): PROBNP in the last 8760 hours.  CBG: Recent Labs  Lab 03/25/18 1138 03/25/18 1632 03/25/18 2210 03/26/18 0744 03/26/18 1131  GLUCAP 150* 147* 112* 92 164*     Signed:  Velvet Bathe MD.  [facility] 03/26/2018, 2:52 PM

## 2018-03-26 NOTE — Progress Notes (Signed)
Patient has discharged to home with [facility] on 03/26/18, discharge instruction including medication and appointment was given to patient. Patient has no question at this time

## 2018-03-26 NOTE — Progress Notes (Signed)
Physical Therapy Treatment Patient Details Name: Dakota Hall MRN: [id number] DOB: [birth year] Today's Date: 03/26/2018    History of Present Illness pt was admitted with compression fx of L4.  PMH:  CVA with right hemiplegia, DM, OSA    PT Comments    Patient plans Dc home. Reports sister will be available to assist. Recommend HHPT. The patient reports that back pain well controlled today during functional mobility actiivity. Transfer to [facility] and self propel WC in hall.  Continue PT.   Follow Up Recommendations  Home health PT     Equipment Recommendations  None recommended by PT    Recommendations for Other Services       Precautions / Restrictions Precautions Precautions: Fall;Back Precaution Comments: R hemiplegia.  Used hemiwalker, WC Required Braces or Orthoses: Spinal Brace Spinal Brace: Thoracolumbosacral orthotic;Applied in sitting position  Patient instructed in don brace, patient able to clip axillary pads and pull both quick draw cords with cues.   Mobility  Bed Mobility Overal bed mobility: Needs Assistance Bed Mobility: Supine to Sit   Sidelying to sit: Min assist;HOB elevated       General bed mobility comments: used PT UE as a trapeze to pull around to sit up, patient self assisted  the legs.  Transfers Overall transfer level: Needs assistance     Sit to Stand: Mod assist Stand pivot transfers: Min assist       General transfer comment: extra time, patient  pulled up on WC arm rest, stand pivot to WC.  Ambulation/Gait                 Psychologist, counselling mobility: Yes Wheelchair propulsion: Left upper extremity;Left lower extremity Wheelchair parts: Independent Distance: x 200' crosses right foot over left to [facility] and use left foot  Modified Rankin (Stroke Patients Only)       Balance                                            Cognition  Arousal/Alertness: Awake/alert                                            Exercises      General Comments        Pertinent Vitals/Pain Faces Pain Scale: Hurts a little bit Pain Location: back Pain Descriptors / Indicators: Discomfort Pain Intervention(s): Premedicated before session;Monitored during session    Home Living               Home Equipment: Wheelchair - manual      Prior Function            PT Goals (current goals can now be found in the care plan section) Progress towards PT goals: Progressing toward goals    Frequency    Min 3X/week      PT Plan Current plan remains appropriate    Co-evaluation              AM-PAC PT "6 Clicks" Daily Activity  Outcome Measure  Difficulty turning over in bed (including adjusting bedclothes, sheets and blankets)?: A Lot Difficulty moving from lying on back to sitting on the side of  the bed? : A Lot Difficulty sitting down on and standing up from a chair with arms (e.g., wheelchair, bedside commode, etc,.)?: A Lot Help needed moving to and from a bed to chair (including a wheelchair)?: A Lot Help needed walking in hospital room?: Total Help needed climbing 3-5 steps with a railing? : Total 6 Click Score: 10    End of Session Equipment Utilized During Treatment: Gait belt;Back brace Activity Tolerance: Patient tolerated treatment well Patient left: in chair;with call bell/phone within reach Nurse Communication: Mobility status PT Visit Diagnosis: Unsteadiness on feet (R26.81);Hemiplegia and hemiparesis;Pain Hemiplegia - caused by: Unspecified     Time: 7564-3329 PT Time Calculation (min) (ACUTE ONLY): 31 min  Charges:  $Therapeutic Activity: 8-22 mins $Wheel Chair Management: 8-22 mins                    G CodesBlanchard Kelch PT [phone]    Rada Hay 03/26/2018, 11:10 AM

## 2018-03-26 NOTE — Care Management Note (Signed)
Case Management Note  Patient Details  Name: Dakota Hall MRN: [id number] Date of Birth: September 09, 1951  Subjective/Objective:                  Home pt  Action/Plan: Has [employer] well [facility] will follow for services.  Expected Discharge Date:  03/26/18               Expected Discharge Plan:  Home w Home Health Services  In-House Referral:     Discharge planning Services  CM Consult  Post Acute Care Choice:    Choice offered to:  Patient  DME Arranged:    DME Agency:     HH Arranged:  PT HH Agency:  [facility]  Status of Service:  Completed, signed off  If discussed at Long Length of Stay Meetings, dates discussed:    Additional Comments:  Golda Acre, RN 03/26/2018, 3:07 PM

## 2018-03-26 NOTE — Progress Notes (Addendum)
ANTICOAGULATION CONSULT NOTE Pharmacy Consult for Warfarin Indication: history of PE, stroke  Allergies  Allergen Reactions  . Penicillins Hives and Other (See Comments)    Has patient had a PCN reaction causing immediate rash, facial/tongue/throat swelling, SOB or lightheadedness with hypotension: no Has patient had a PCN reaction causing severe rash involving mucus membranes or skin necrosis: no Has patient had a PCN reaction that required hospitalization: no Has patient had a PCN reaction occurring within the last 10 years: no If all of the above answers are "NO", then may proceed with Cephalosporin use.     Patient Measurements: Height:  (170.2 cm) Weight: 254 lb 6.6 oz (115.4 kg) IBW/kg (Calculated) : 66.1  Vital Signs: Temp: 98.4 F (36.9 C) (05/10 0451) Temp Source: Oral (05/10 0451) BP: 196/95 (05/10 0451) Pulse Rate: 72 (05/10 0451)  Labs: Recent Labs    03/24/18 0210 03/25/18 0549 03/26/18 0605  HGB 16.4 14.2  --   HCT 46.4 41.5  --   PLT 177 166  --   LABPROT 22.9* 25.9* 31.2*  INR 2.04 2.39 3.04  CREATININE 1.21 1.13  --     Estimated Creatinine Clearance: 78 mL/min (by C-G formula based on SCr of 1.13 mg/dL).   Assessment: Dakota Hall on warfarin for history of stroke and PE admitted on 03/24/18 with lower back pain secondary to L4 compression fracture. Pharmacy consulted to assist with dosing of warfarin while patient admitted.   Baseline INR therapeutic  Prior anticoagulation: warfarin  on M/W and 2.5mg  all other days of the week, LD 5/7  5/8: Received Coumadin 2.5mg  at [facility] prior to transfer to [facility]  Today, 03/26/2018:  INR supra-therapeutic today on home dose  Major drug interactions: glucocorticoids may potentiate warfarin's effect on INR  CBC WNL.  No bleeding issues per nursing  Eating >75% of meals  Goal of Therapy: INR 2-3  Plan:  Hold warfarin tonight  Daily INR  CBC at least q72 hr while on warfarin  Monitor for signs  of bleeding or thrombosis  Junita Push, PharmD, BCPS Pager: [phone] 03/26/2018, 11:06 AM

## 2018-03-26 NOTE — Progress Notes (Signed)
OT Cancellation Note  Patient Details Name: Dakota Hall MRN: [id number] DOB: Feb 15, 1951   Cancelled Treatment:    Reason Eval/Treat Not Completed: Other (comment)  Pt got up with PT earlier and feels comfortable with brace and bed mobility.  He did not want to get OOB again today and has no further questions for OT.  Sister is coming to assist.  Will sign off.  Dakota Hall 03/26/2018, 1:50 PM  Marica Otter, OTR/L [phone] 03/26/2018

## 2018-03-29 DIAGNOSIS — I69351 Hemiplegia and hemiparesis following cerebral infarction affecting right dominant side: Secondary | ICD-10-CM | POA: Diagnosis not present

## 2018-03-29 DIAGNOSIS — G4733 Obstructive sleep apnea (adult) (pediatric): Secondary | ICD-10-CM | POA: Diagnosis not present

## 2018-03-29 DIAGNOSIS — M4856XD Collapsed vertebra, not elsewhere classified, lumbar region, subsequent encounter for fracture with routine healing: Secondary | ICD-10-CM | POA: Diagnosis not present

## 2018-03-29 DIAGNOSIS — E1122 Type 2 diabetes mellitus with diabetic chronic kidney disease: Secondary | ICD-10-CM | POA: Diagnosis not present

## 2018-03-29 DIAGNOSIS — F418 Other specified anxiety disorders: Secondary | ICD-10-CM | POA: Diagnosis not present

## 2018-03-29 DIAGNOSIS — F319 Bipolar disorder, unspecified: Secondary | ICD-10-CM | POA: Diagnosis not present

## 2018-03-29 DIAGNOSIS — I129 Hypertensive chronic kidney disease with stage 1 through stage 4 chronic kidney disease, or unspecified chronic kidney disease: Secondary | ICD-10-CM | POA: Diagnosis not present

## 2018-03-29 DIAGNOSIS — E785 Hyperlipidemia, unspecified: Secondary | ICD-10-CM | POA: Diagnosis not present

## 2018-03-29 DIAGNOSIS — N183 Chronic kidney disease, stage 3 (moderate): Secondary | ICD-10-CM | POA: Diagnosis not present

## 2018-03-31 DIAGNOSIS — Z7901 Long term (current) use of anticoagulants: Secondary | ICD-10-CM | POA: Diagnosis not present

## 2018-03-31 DIAGNOSIS — I69922 Dysarthria following unspecified cerebrovascular disease: Secondary | ICD-10-CM | POA: Diagnosis not present

## 2018-03-31 DIAGNOSIS — S32040D Wedge compression fracture of fourth lumbar vertebra, subsequent encounter for fracture with routine healing: Secondary | ICD-10-CM | POA: Diagnosis not present

## 2018-03-31 DIAGNOSIS — E1165 Type 2 diabetes mellitus with hyperglycemia: Secondary | ICD-10-CM | POA: Diagnosis not present

## 2018-03-31 DIAGNOSIS — I69151 Hemiplegia and hemiparesis following nontraumatic intracerebral hemorrhage affecting right dominant side: Secondary | ICD-10-CM | POA: Diagnosis not present

## 2018-04-01 DIAGNOSIS — M4856XD Collapsed vertebra, not elsewhere classified, lumbar region, subsequent encounter for fracture with routine healing: Secondary | ICD-10-CM | POA: Diagnosis not present

## 2018-04-01 DIAGNOSIS — E1122 Type 2 diabetes mellitus with diabetic chronic kidney disease: Secondary | ICD-10-CM | POA: Diagnosis not present

## 2018-04-01 DIAGNOSIS — I129 Hypertensive chronic kidney disease with stage 1 through stage 4 chronic kidney disease, or unspecified chronic kidney disease: Secondary | ICD-10-CM | POA: Diagnosis not present

## 2018-04-01 DIAGNOSIS — I69351 Hemiplegia and hemiparesis following cerebral infarction affecting right dominant side: Secondary | ICD-10-CM | POA: Diagnosis not present

## 2018-04-01 DIAGNOSIS — F319 Bipolar disorder, unspecified: Secondary | ICD-10-CM | POA: Diagnosis not present

## 2018-04-01 DIAGNOSIS — E785 Hyperlipidemia, unspecified: Secondary | ICD-10-CM | POA: Diagnosis not present

## 2018-04-01 DIAGNOSIS — N183 Chronic kidney disease, stage 3 (moderate): Secondary | ICD-10-CM | POA: Diagnosis not present

## 2018-04-01 DIAGNOSIS — F418 Other specified anxiety disorders: Secondary | ICD-10-CM | POA: Diagnosis not present

## 2018-04-01 DIAGNOSIS — G4733 Obstructive sleep apnea (adult) (pediatric): Secondary | ICD-10-CM | POA: Diagnosis not present

## 2018-04-02 DIAGNOSIS — N183 Chronic kidney disease, stage 3 (moderate): Secondary | ICD-10-CM | POA: Diagnosis not present

## 2018-04-02 DIAGNOSIS — I129 Hypertensive chronic kidney disease with stage 1 through stage 4 chronic kidney disease, or unspecified chronic kidney disease: Secondary | ICD-10-CM | POA: Diagnosis not present

## 2018-04-02 DIAGNOSIS — E785 Hyperlipidemia, unspecified: Secondary | ICD-10-CM | POA: Diagnosis not present

## 2018-04-02 DIAGNOSIS — F319 Bipolar disorder, unspecified: Secondary | ICD-10-CM | POA: Diagnosis not present

## 2018-04-02 DIAGNOSIS — G4733 Obstructive sleep apnea (adult) (pediatric): Secondary | ICD-10-CM | POA: Diagnosis not present

## 2018-04-02 DIAGNOSIS — M4856XD Collapsed vertebra, not elsewhere classified, lumbar region, subsequent encounter for fracture with routine healing: Secondary | ICD-10-CM | POA: Diagnosis not present

## 2018-04-02 DIAGNOSIS — I69351 Hemiplegia and hemiparesis following cerebral infarction affecting right dominant side: Secondary | ICD-10-CM | POA: Diagnosis not present

## 2018-04-02 DIAGNOSIS — F418 Other specified anxiety disorders: Secondary | ICD-10-CM | POA: Diagnosis not present

## 2018-04-02 DIAGNOSIS — E1122 Type 2 diabetes mellitus with diabetic chronic kidney disease: Secondary | ICD-10-CM | POA: Diagnosis not present

## 2018-04-05 DIAGNOSIS — F418 Other specified anxiety disorders: Secondary | ICD-10-CM | POA: Diagnosis not present

## 2018-04-05 DIAGNOSIS — E1122 Type 2 diabetes mellitus with diabetic chronic kidney disease: Secondary | ICD-10-CM | POA: Diagnosis not present

## 2018-04-05 DIAGNOSIS — I69351 Hemiplegia and hemiparesis following cerebral infarction affecting right dominant side: Secondary | ICD-10-CM | POA: Diagnosis not present

## 2018-04-05 DIAGNOSIS — G4733 Obstructive sleep apnea (adult) (pediatric): Secondary | ICD-10-CM | POA: Diagnosis not present

## 2018-04-05 DIAGNOSIS — E785 Hyperlipidemia, unspecified: Secondary | ICD-10-CM | POA: Diagnosis not present

## 2018-04-05 DIAGNOSIS — I129 Hypertensive chronic kidney disease with stage 1 through stage 4 chronic kidney disease, or unspecified chronic kidney disease: Secondary | ICD-10-CM | POA: Diagnosis not present

## 2018-04-05 DIAGNOSIS — F319 Bipolar disorder, unspecified: Secondary | ICD-10-CM | POA: Diagnosis not present

## 2018-04-05 DIAGNOSIS — M4856XD Collapsed vertebra, not elsewhere classified, lumbar region, subsequent encounter for fracture with routine healing: Secondary | ICD-10-CM | POA: Diagnosis not present

## 2018-04-05 DIAGNOSIS — N183 Chronic kidney disease, stage 3 (moderate): Secondary | ICD-10-CM | POA: Diagnosis not present

## 2018-04-06 DIAGNOSIS — E785 Hyperlipidemia, unspecified: Secondary | ICD-10-CM | POA: Diagnosis not present

## 2018-04-06 DIAGNOSIS — G4733 Obstructive sleep apnea (adult) (pediatric): Secondary | ICD-10-CM | POA: Diagnosis not present

## 2018-04-06 DIAGNOSIS — F418 Other specified anxiety disorders: Secondary | ICD-10-CM | POA: Diagnosis not present

## 2018-04-06 DIAGNOSIS — F319 Bipolar disorder, unspecified: Secondary | ICD-10-CM | POA: Diagnosis not present

## 2018-04-06 DIAGNOSIS — I129 Hypertensive chronic kidney disease with stage 1 through stage 4 chronic kidney disease, or unspecified chronic kidney disease: Secondary | ICD-10-CM | POA: Diagnosis not present

## 2018-04-06 DIAGNOSIS — M4856XD Collapsed vertebra, not elsewhere classified, lumbar region, subsequent encounter for fracture with routine healing: Secondary | ICD-10-CM | POA: Diagnosis not present

## 2018-04-06 DIAGNOSIS — E1122 Type 2 diabetes mellitus with diabetic chronic kidney disease: Secondary | ICD-10-CM | POA: Diagnosis not present

## 2018-04-06 DIAGNOSIS — I69351 Hemiplegia and hemiparesis following cerebral infarction affecting right dominant side: Secondary | ICD-10-CM | POA: Diagnosis not present

## 2018-04-06 DIAGNOSIS — N183 Chronic kidney disease, stage 3 (moderate): Secondary | ICD-10-CM | POA: Diagnosis not present

## 2018-04-07 DIAGNOSIS — N183 Chronic kidney disease, stage 3 (moderate): Secondary | ICD-10-CM | POA: Diagnosis not present

## 2018-04-07 DIAGNOSIS — F418 Other specified anxiety disorders: Secondary | ICD-10-CM | POA: Diagnosis not present

## 2018-04-07 DIAGNOSIS — I69351 Hemiplegia and hemiparesis following cerebral infarction affecting right dominant side: Secondary | ICD-10-CM | POA: Diagnosis not present

## 2018-04-07 DIAGNOSIS — G4733 Obstructive sleep apnea (adult) (pediatric): Secondary | ICD-10-CM | POA: Diagnosis not present

## 2018-04-07 DIAGNOSIS — I129 Hypertensive chronic kidney disease with stage 1 through stage 4 chronic kidney disease, or unspecified chronic kidney disease: Secondary | ICD-10-CM | POA: Diagnosis not present

## 2018-04-07 DIAGNOSIS — F319 Bipolar disorder, unspecified: Secondary | ICD-10-CM | POA: Diagnosis not present

## 2018-04-07 DIAGNOSIS — E1122 Type 2 diabetes mellitus with diabetic chronic kidney disease: Secondary | ICD-10-CM | POA: Diagnosis not present

## 2018-04-07 DIAGNOSIS — E785 Hyperlipidemia, unspecified: Secondary | ICD-10-CM | POA: Diagnosis not present

## 2018-04-07 DIAGNOSIS — M4856XD Collapsed vertebra, not elsewhere classified, lumbar region, subsequent encounter for fracture with routine healing: Secondary | ICD-10-CM | POA: Diagnosis not present

## 2018-04-09 DIAGNOSIS — I129 Hypertensive chronic kidney disease with stage 1 through stage 4 chronic kidney disease, or unspecified chronic kidney disease: Secondary | ICD-10-CM | POA: Diagnosis not present

## 2018-04-09 DIAGNOSIS — F418 Other specified anxiety disorders: Secondary | ICD-10-CM | POA: Diagnosis not present

## 2018-04-09 DIAGNOSIS — F319 Bipolar disorder, unspecified: Secondary | ICD-10-CM | POA: Diagnosis not present

## 2018-04-09 DIAGNOSIS — E785 Hyperlipidemia, unspecified: Secondary | ICD-10-CM | POA: Diagnosis not present

## 2018-04-09 DIAGNOSIS — I69351 Hemiplegia and hemiparesis following cerebral infarction affecting right dominant side: Secondary | ICD-10-CM | POA: Diagnosis not present

## 2018-04-09 DIAGNOSIS — M4856XD Collapsed vertebra, not elsewhere classified, lumbar region, subsequent encounter for fracture with routine healing: Secondary | ICD-10-CM | POA: Diagnosis not present

## 2018-04-09 DIAGNOSIS — E1122 Type 2 diabetes mellitus with diabetic chronic kidney disease: Secondary | ICD-10-CM | POA: Diagnosis not present

## 2018-04-09 DIAGNOSIS — G4733 Obstructive sleep apnea (adult) (pediatric): Secondary | ICD-10-CM | POA: Diagnosis not present

## 2018-04-09 DIAGNOSIS — N183 Chronic kidney disease, stage 3 (moderate): Secondary | ICD-10-CM | POA: Diagnosis not present

## 2018-04-12 ENCOUNTER — Emergency Department (HOSPITAL_COMMUNITY): Payer: Medicare HMO

## 2018-04-12 ENCOUNTER — Inpatient Hospital Stay (HOSPITAL_COMMUNITY)
Admission: EM | Admit: 2018-04-12 | Discharge: 2018-04-19 | DRG: 516 | Disposition: A | Discharge status: Skilled Nursing Facility | Payer: Medicare HMO | Attending: Internal Medicine | Admitting: Internal Medicine

## 2018-04-12 ENCOUNTER — Encounter (HOSPITAL_COMMUNITY): Payer: Self-pay

## 2018-04-12 DIAGNOSIS — R262 Difficulty in walking, not elsewhere classified: Secondary | ICD-10-CM | POA: Diagnosis not present

## 2018-04-12 DIAGNOSIS — G8929 Other chronic pain: Secondary | ICD-10-CM | POA: Diagnosis present

## 2018-04-12 DIAGNOSIS — I69351 Hemiplegia and hemiparesis following cerebral infarction affecting right dominant side: Secondary | ICD-10-CM | POA: Diagnosis not present

## 2018-04-12 DIAGNOSIS — M5116 Intervertebral disc disorders with radiculopathy, lumbar region: Secondary | ICD-10-CM

## 2018-04-12 DIAGNOSIS — I679 Cerebrovascular disease, unspecified: Secondary | ICD-10-CM | POA: Diagnosis present

## 2018-04-12 DIAGNOSIS — Z7901 Long term (current) use of anticoagulants: Secondary | ICD-10-CM | POA: Diagnosis not present

## 2018-04-12 DIAGNOSIS — R791 Abnormal coagulation profile: Secondary | ICD-10-CM | POA: Diagnosis not present

## 2018-04-12 DIAGNOSIS — M545 Low back pain, unspecified: Secondary | ICD-10-CM | POA: Diagnosis present

## 2018-04-12 DIAGNOSIS — S32040D Wedge compression fracture of fourth lumbar vertebra, subsequent encounter for fracture with routine healing: Secondary | ICD-10-CM | POA: Diagnosis not present

## 2018-04-12 DIAGNOSIS — M5442 Lumbago with sciatica, left side: Secondary | ICD-10-CM | POA: Diagnosis not present

## 2018-04-12 DIAGNOSIS — Z880 Allergy status to penicillin: Secondary | ICD-10-CM

## 2018-04-12 DIAGNOSIS — E1159 Type 2 diabetes mellitus with other circulatory complications: Secondary | ICD-10-CM | POA: Diagnosis not present

## 2018-04-12 DIAGNOSIS — E669 Obesity, unspecified: Secondary | ICD-10-CM | POA: Diagnosis present

## 2018-04-12 DIAGNOSIS — E785 Hyperlipidemia, unspecified: Secondary | ICD-10-CM | POA: Diagnosis present

## 2018-04-12 DIAGNOSIS — M549 Dorsalgia, unspecified: Secondary | ICD-10-CM

## 2018-04-12 DIAGNOSIS — I69398 Other sequelae of cerebral infarction: Secondary | ICD-10-CM

## 2018-04-12 DIAGNOSIS — R279 Unspecified lack of coordination: Secondary | ICD-10-CM | POA: Diagnosis not present

## 2018-04-12 DIAGNOSIS — G4733 Obstructive sleep apnea (adult) (pediatric): Secondary | ICD-10-CM | POA: Diagnosis present

## 2018-04-12 DIAGNOSIS — Z9989 Dependence on other enabling machines and devices: Secondary | ICD-10-CM | POA: Diagnosis not present

## 2018-04-12 DIAGNOSIS — E1151 Type 2 diabetes mellitus with diabetic peripheral angiopathy without gangrene: Secondary | ICD-10-CM | POA: Diagnosis present

## 2018-04-12 DIAGNOSIS — S32048D Other fracture of fourth lumbar vertebra, subsequent encounter for fracture with routine healing: Secondary | ICD-10-CM | POA: Diagnosis not present

## 2018-04-12 DIAGNOSIS — I872 Venous insufficiency (chronic) (peripheral): Secondary | ICD-10-CM | POA: Diagnosis present

## 2018-04-12 DIAGNOSIS — R030 Elevated blood-pressure reading, without diagnosis of hypertension: Secondary | ICD-10-CM | POA: Diagnosis not present

## 2018-04-12 DIAGNOSIS — Z86711 Personal history of pulmonary embolism: Secondary | ICD-10-CM

## 2018-04-12 DIAGNOSIS — N183 Chronic kidney disease, stage 3 (moderate): Secondary | ICD-10-CM | POA: Diagnosis present

## 2018-04-12 DIAGNOSIS — M62830 Muscle spasm of back: Secondary | ICD-10-CM | POA: Diagnosis present

## 2018-04-12 DIAGNOSIS — I129 Hypertensive chronic kidney disease with stage 1 through stage 4 chronic kidney disease, or unspecified chronic kidney disease: Secondary | ICD-10-CM | POA: Diagnosis present

## 2018-04-12 DIAGNOSIS — E1122 Type 2 diabetes mellitus with diabetic chronic kidney disease: Secondary | ICD-10-CM | POA: Diagnosis present

## 2018-04-12 DIAGNOSIS — Z79899 Other long term (current) drug therapy: Secondary | ICD-10-CM | POA: Diagnosis not present

## 2018-04-12 DIAGNOSIS — F319 Bipolar disorder, unspecified: Secondary | ICD-10-CM | POA: Diagnosis present

## 2018-04-12 DIAGNOSIS — I2699 Other pulmonary embolism without acute cor pulmonale: Secondary | ICD-10-CM | POA: Diagnosis not present

## 2018-04-12 DIAGNOSIS — M160 Bilateral primary osteoarthritis of hip: Secondary | ICD-10-CM | POA: Diagnosis not present

## 2018-04-12 DIAGNOSIS — Z6841 Body Mass Index (BMI) 40.0 and over, adult: Secondary | ICD-10-CM | POA: Diagnosis not present

## 2018-04-12 DIAGNOSIS — R488 Other symbolic dysfunctions: Secondary | ICD-10-CM | POA: Diagnosis not present

## 2018-04-12 DIAGNOSIS — Z743 Need for continuous supervision: Secondary | ICD-10-CM | POA: Diagnosis not present

## 2018-04-12 DIAGNOSIS — M6281 Muscle weakness (generalized): Secondary | ICD-10-CM | POA: Diagnosis not present

## 2018-04-12 DIAGNOSIS — M48061 Spinal stenosis, lumbar region without neurogenic claudication: Secondary | ICD-10-CM

## 2018-04-12 DIAGNOSIS — F418 Other specified anxiety disorders: Secondary | ICD-10-CM | POA: Diagnosis present

## 2018-04-12 DIAGNOSIS — Z8673 Personal history of transient ischemic attack (TIA), and cerebral infarction without residual deficits: Secondary | ICD-10-CM

## 2018-04-12 DIAGNOSIS — S32030A Wedge compression fracture of third lumbar vertebra, initial encounter for closed fracture: Secondary | ICD-10-CM | POA: Diagnosis not present

## 2018-04-12 DIAGNOSIS — M5489 Other dorsalgia: Secondary | ICD-10-CM | POA: Diagnosis not present

## 2018-04-12 DIAGNOSIS — M4856XA Collapsed vertebra, not elsewhere classified, lumbar region, initial encounter for fracture: Secondary | ICD-10-CM | POA: Diagnosis present

## 2018-04-12 DIAGNOSIS — Z4789 Encounter for other orthopedic aftercare: Secondary | ICD-10-CM | POA: Diagnosis not present

## 2018-04-12 DIAGNOSIS — M4856XD Collapsed vertebra, not elsewhere classified, lumbar region, subsequent encounter for fracture with routine healing: Secondary | ICD-10-CM | POA: Diagnosis not present

## 2018-04-12 DIAGNOSIS — I10 Essential (primary) hypertension: Secondary | ICD-10-CM | POA: Diagnosis not present

## 2018-04-12 DIAGNOSIS — S72009A Fracture of unspecified part of neck of unspecified femur, initial encounter for closed fracture: Secondary | ICD-10-CM

## 2018-04-12 DIAGNOSIS — M4807 Spinal stenosis, lumbosacral region: Secondary | ICD-10-CM | POA: Diagnosis not present

## 2018-04-12 LAB — PROTIME-INR
INR: 2.08
Prothrombin Time: 23.3 seconds — ABNORMAL HIGH (ref 11.4–15.2)

## 2018-04-12 LAB — BASIC METABOLIC PANEL
ANION GAP: 12 (ref 5–15)
BUN: 21 mg/dL — ABNORMAL HIGH (ref 6–20)
CHLORIDE: 108 mmol/L (ref 101–111)
CO2: 24 mmol/L (ref 22–32)
CREATININE: 1.33 mg/dL — AB (ref 0.61–1.24)
Calcium: 9.7 mg/dL (ref 8.9–10.3)
GFR calc non Af Amer: 54 mL/min — ABNORMAL LOW (ref >60)
Glucose, Bld: 111 mg/dL — ABNORMAL HIGH (ref 65–99)
POTASSIUM: 4.4 mmol/L (ref 3.5–5.1)
SODIUM: 144 mmol/L (ref 135–145)

## 2018-04-12 LAB — URINALYSIS, ROUTINE W REFLEX MICROSCOPIC
BILIRUBIN URINE: NEGATIVE
Bacteria, UA: NONE SEEN
GLUCOSE, UA: NEGATIVE mg/dL
KETONES UR: NEGATIVE mg/dL
LEUKOCYTES UA: NEGATIVE
Nitrite: NEGATIVE
PH: 5 (ref 5.0–8.0)
PROTEIN: NEGATIVE mg/dL
Specific Gravity, Urine: 1.015 (ref 1.005–1.030)

## 2018-04-12 LAB — CBC
HEMATOCRIT: 45.5 % (ref 39.0–52.0)
HEMOGLOBIN: 15.5 g/dL (ref 13.0–17.0)
MCH: 30.3 pg (ref 26.0–34.0)
MCHC: 34.1 g/dL (ref 30.0–36.0)
MCV: 88.9 fL (ref 78.0–100.0)
Platelets: 164 10*3/uL (ref 150–400)
RBC: 5.12 MIL/uL (ref 4.22–5.81)
RDW: 13.2 % (ref 11.5–15.5)
WBC: 8.9 10*3/uL (ref 4.0–10.5)

## 2018-04-12 MED ORDER — FENTANYL CITRATE (PF) 100 MCG/2ML IJ SOLN
50.0000 ug | Freq: Once | INTRAMUSCULAR | Status: AC
  Administered 2018-04-12: 50 ug via INTRAVENOUS
  Filled 2018-04-12: qty 2

## 2018-04-12 MED ORDER — LIDOCAINE 5 % EX PTCH: 1.0000 | MEDICATED_PATCH | CUTANEOUS | 0 refills | Status: DC

## 2018-04-12 MED ORDER — GABAPENTIN 300 MG PO CAPS: 300.0000 mg | ORAL_CAPSULE | Freq: Three times a day (TID) | ORAL | 0 refills | Status: DC

## 2018-04-12 MED ORDER — LIDOCAINE 5 % EX PTCH
1.0000 | MEDICATED_PATCH | CUTANEOUS | Status: DC
  Administered 2018-04-12 – 2018-04-19 (×7): 1 via TRANSDERMAL
  Filled 2018-04-12 (×7): qty 1

## 2018-04-12 MED ORDER — ONDANSETRON HCL 4 MG/2ML IJ SOLN
4.0000 mg | Freq: Once | INTRAMUSCULAR | Status: AC
  Administered 2018-04-12: 4 mg via INTRAVENOUS
  Filled 2018-04-12: qty 2

## 2018-04-12 MED ORDER — HYDROMORPHONE HCL 1 MG/ML IJ SOLN
1.0000 mg | Freq: Once | INTRAMUSCULAR | Status: AC
  Administered 2018-04-12: 1 mg via INTRAVENOUS
  Filled 2018-04-12: qty 1

## 2018-04-12 NOTE — ED Triage Notes (Signed)
Per EMS: Hx of L4 compression from 201.  Pt hx of  stroke in 2015 with right sided paralysis. Pt c/o of low back pain aggravation. Pt was admitted here 1 mo ago for back pain and was prescribed oxycodone for pain.  Now the pain medicine isn't helping.  Pt also has a Hx of HTN and diabetes and is on warfarin.

## 2018-04-12 NOTE — ED Notes (Signed)
When administering dilaudid pt began to desat.  Pt was placed on 2 L nasal cannula.  Pt now satting WNL.

## 2018-04-12 NOTE — ED Provider Notes (Signed)
[facility] DEPT Provider Note   CSN: [id number] Arrival date & time: 04/12/18  1908     History   Chief Complaint Chief Complaint  Patient presents with  . Back Pain    HPI Dakota Hall is a 67 y.o. male.  Dakota Hall is a 67 y.o. 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  Patient denies any inciting injury or fall.  Patient is accompanied by his wife who his his primary caretaker at home.  She reports that he had to be admitted earlier this month due to worsening back pain and was found to have a L4 compression fracture.  She reports after discharge they wanted him to go to a rehab initially but he wanted to go home, has been having progressively worsening mobility due to pain over the past few days, and has primarily been in his wheelchair for the most of the day.  Patient reports pain is in the middle of his back and radiates down both legs.  He reports taking 2 oxycodone tens today without improvement.  He denies any associated abdominal pain, nausea, vomiting, diarrhea, fevers or chills.  Denies any urinary symptoms, no loss of bowel or bladder control, constipation or urinary retention.  Patient denies any chest pain or shortness of breath.     Past Medical History:  Diagnosis Date  . 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 Back spasm   . Chronic kidney disease (CKD), stage III (moderate) (HCC)   . Depression with anxiety   . Diabetes mellitus   . Dysarthria   . Dyslipidemia   . Hypertension   . Obesity   . OSA on CPAP   . Pulmonary emboli ([place]) 02/2014  . Stasis edema of both lower extremities   . Stroke [facility]) July 2013   left  paramedian pontine, incidental right parietal subcortical. both d/t small vessel disease  . Stroke [facility]) March 2015   left basal ganglia secondary to small vessel disease    Patient Active Problem List   Diagnosis Date Noted  . Compression fracture of L4 lumbar vertebra, closed, initial encounter ([place]) 03/24/2018  . Lumbago 03/24/2018  . Pulmonary embolism ([place]) 05/03/2014  . AKI (acute kidney injury) ([place]) 04/22/2014  . Bradycardia 04/22/2014  . Encephalopathy 04/22/2014  . Malnutrition of moderate degree ([place]) 03/15/2014  . CVA (cerebral infarction) 02/08/2014  . Depression with anxiety   . 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 Obesity   . Stroke ([place]) 02/03/2014  . Unspecified cerebral artery occlusion with cerebral infarction 07/20/2013  . Stroke, small vessel ([place]) 05/18/2012  . Slurred speech 05/15/2012  . HTN (hypertension) 05/15/2012  . Noncompliance with medication regimen 05/15/2012  . DM (diabetes mellitus) ([place]) 05/15/2012  . Vision changes 05/15/2012    Past Surgical History:  Procedure Laterality Date  . ANAL FISSURE REPAIR    . TONSILLECTOMY          Home Medications    Prior to Admission medications   Medication Sig Start Date End Date Taking? Authorizing Provider  amLODipine (NORVASC) 10 MG tablet Take 10 mg by mouth daily.    [provider]  ARIPiprazole (ABILIFY) 5 MG tablet Take 2.5 mg by mouth daily.  03/22/18  [provider]  baclofen (LIORESAL) 20 MG tablet Take 1 tablet (20 mg total) by mouth 3 (three) times daily. Patient taking differently: Take 20 mg by mouth 2 (two) times daily.  03/03/14   Love, Ivan Anchors, PA-C  Blood Glucose Monitoring Suppl (ONE TOUCH ULTRA 2) W/DEVICE KIT  05/14/14   [provider]  docusate sodium (COLACE) 100 MG capsule Take 100 mg by mouth every Monday, Wednesday, and Friday.     [provider]  furosemide (LASIX) 20 MG tablet Take 20 mg by mouth daily. 03/11/18   [provider]  hydrALAZINE (APRESOLINE) 25 MG tablet Take 25 mg by mouth 2 (two) times daily.  01/24/18   [provider]  isosorbide mononitrate (IMDUR) 30 MG 24 hr tablet Take 30 mg by mouth daily. 01/31/18   [provider]  Menthol, Topical Analgesic, (BENGAY EX) Apply 1 application topically as needed (For pain.).    [provider]  olmesartan (BENICAR) 5 MG tablet Take 5 mg by mouth daily. 02/24/18   [provider]  ondansetron (ZOFRAN) 4 MG tablet Take 4 mg by mouth every 6 (six) hours as needed for nausea or vomiting.  12/22/17   [provider]  ONE TOUCH ULTRA TEST test strip  05/14/14   [provider]  ONETOUCH DELICA LANCETS 63O MISC  05/14/14   [provider]  pioglitazone (ACTOS) 15 MG tablet Take 7.5 mg by mouth daily.     [provider]  potassium chloride SA (K-DUR,KLOR-CON) 20 MEQ tablet Take 20 mEq by mouth daily. 02/16/18   [provider]  sertraline (ZOLOFT) 100 MG tablet Take 150 mg by mouth daily.     [provider]  simvastatin (ZOCOR) 20 MG tablet Take 1 tablet (20 mg total) by mouth daily at 6 PM. 05/18/12   Debbe Odea, MD  traMADol (ULTRAM) 50 MG tablet Take 50 mg by mouth 4 (four) times daily as needed (For pain.).  03/16/18   [provider]  valsartan (DIOVAN) 80 MG tablet Take 80 mg by mouth daily.  02/11/17   [provider]  Vitamin D, Ergocalciferol, (DRISDOL) 50000 UNITS CAPS capsule Take 50,000 Units by mouth every Friday.  06/26/13   [provider]  warfarin (COUMADIN) 2.5 MG tablet Take 2.5-5 mg by mouth daily. Take two tablets on Monday and Wednesday and one tablet the rest of the week.    [provider]    Family History Family History  Problem Relation Age of Onset  . Cancer Mother   . Heart disease Father     Social History Social History   Tobacco Use  . Smoking status: Never Smoker  . Smokeless tobacco: Never Used  Substance Use  Topics  . Alcohol use: No    Alcohol/week: 0.0 oz  . Drug use: No     Allergies   Penicillins   Review of Systems Review of Systems  Constitutional: Negative for chills and fever.  HENT: Negative.   Eyes: Negative for visual disturbance.  Respiratory: Negative for cough and shortness of breath.   Cardiovascular: Negative for chest pain.  Gastrointestinal: Negative for abdominal pain, nausea and vomiting.  Genitourinary: Negative for dysuria, flank pain, frequency and hematuria.  Musculoskeletal: Positive for back pain. Negative for arthralgias, joint swelling, myalgias and neck pain.  Skin: Negative for color change and rash.  Neurological: Negative for dizziness, weakness, light-headedness, numbness and headaches.     Physical Exam Updated Vital Signs BP Marland Kitchen)  168/73 (BP Location: Left Arm)   Pulse 70   Temp 98.6 F (37 C) (Oral)   Resp 17   Ht _0  (1.702 m)   Wt 116.6 kg (257 lb)   SpO2 96%   BMI 40.25 kg/m   Physical Exam  Constitutional: He appears well-developed and well-nourished. No distress.  HENT:  Head: Normocephalic and atraumatic.  Mouth/Throat: Oropharynx is clear and moist.  Eyes: Right eye exhibits no discharge. Left eye exhibits no discharge.  Neck: Neck supple.  Cardiovascular: Normal rate, regular rhythm, normal heart sounds and intact distal pulses.  Pulmonary/Chest: Effort normal and breath sounds normal. No stridor. No respiratory distress. He has no wheezes. He has no rales.  Respirations equal and unlabored, patient able to speak in full sentences, lungs clear to auscultation bilaterally  Abdominal: Soft. Bowel sounds are normal. He exhibits no distension and no mass. There is no tenderness. There is no guarding.  Musculoskeletal: He exhibits no edema or deformity.  Tenderness to palpation over midline lumbar spine, no palpable deformity, no overlying erythema or warmth Patient able to move left lower extremity, chronic paralysis of the right  lower extremity at baseline, right lower extremity with some chronic edema, wife reports this is at baseline as well.  Neurological: He is alert. Coordination normal.  Speech is clear, able to follow commands CN III-XII intact Chronic right-sided paralysis s/p stroke, left upper and lower extremities with normal strength Sensation normal to light and sharp touch Moves extremities without ataxia, coordination intact  Skin: Skin is warm and dry. Capillary refill takes less than 2 seconds. He is not diaphoretic.  Psychiatric: He has a normal mood and affect. His behavior is normal.  Nursing note and vitals reviewed.    ED Treatments / Results  Labs (all labs ordered are listed, but only abnormal results are displayed) Labs Reviewed  BASIC METABOLIC PANEL - Abnormal; Notable for the following components:      Result Value   Glucose, Bld 111 (*)    BUN 21 (*)    Creatinine, Ser 1.33 (*)    GFR calc non Af Amer 54 (*)    All other components within normal limits  PROTIME-INR - Abnormal; Notable for the following components:   Prothrombin Time 23.3 (*)    All other components within normal limits  URINALYSIS, ROUTINE W REFLEX MICROSCOPIC - Abnormal; Notable for the following components:   Hgb urine dipstick SMALL (*)    All other components within normal limits  CBC    EKG None  Radiology Ct Lumbar Spine Wo Contrast  Result Date: 04/12/2018 CLINICAL DATA:  Worsening low back pain. History of L4 compression fracture. EXAM: CT LUMBAR SPINE WITHOUT CONTRAST TECHNIQUE: Multidetector CT imaging of the lumbar spine was performed without intravenous contrast administration. Multiplanar CT image reconstructions were also generated. COMPARISON:  CT abdomen pelvis 03/24/2018 FINDINGS: Segmentation: 5 lumbar type vertebrae. Alignment: Normal. Vertebrae: Chronic compression deformities of L1 and L4. There has been progression of L4 height loss compared to 03/24/2018. No new compression fracture.  Paraspinal and other soft tissues: Atherosclerotic calcification of the abdominal aorta. Disc levels: Mild narrowing of the spinal canal at L3-L4 and L4-L5. Mild-to-moderate bilateral L4-5 foraminal stenosis. Otherwise, the spinal canal is widely patent. IMPRESSION: 1. Mild progression of height loss at L4 compared to the 03/24/2018 study. Unchanged appearance of L1 compression fracture. 2. Mild spinal canal narrowing at L3-4 and L4-5 with mild-to-moderate bilateral L4-5 foraminal stenosis. 3.  Aortic Atherosclerosis (ICD10-I70.0). Electronically Signed  By: Ulyses Jarred M.D.   On: 04/12/2018 23:00    Procedures Procedures (including critical care time)  Medications Ordered in ED Medications  lidocaine (LIDODERM) 5 % 1 patch (1 patch Transdermal Patch Applied 04/12/18 2330)  fentaNYL (SUBLIMAZE) injection 50 mcg (50 mcg Intravenous Given 04/12/18 2119)  ondansetron (ZOFRAN) injection 4 mg (4 mg Intravenous Given 04/12/18 2119)  HYDROmorphone (DILAUDID) injection 1 mg (1 mg Intravenous Given 04/12/18 2209)     Initial Impression / Assessment and Plan / ED Course  I have reviewed the triage vital signs and the nursing notes.  Pertinent labs & imaging results that were available during my care of the patient were reviewed by me and considered in my medical decision making (see chart for details).  Patient presents for acute exacerbation of his low back pain over the past few days, was seen earlier this month and diagnosed with new L4 compression fracture.  Patient denies any inciting injury.  Patient has right-sided paralysis at baseline from a previous stroke, his wife cares for him at home and he has no additional caregivers in the house.  Patient's neurologic exam appears to be at baseline, although he is extremely uncomfortable and writhing on stretcher.  We will get basic labs, CK, and CT of the lumbar spine. Fentanyl for pain.  Patient continues to complain of severe pain, will give Dilaudid.   Labs overall reassuring, no leukocytosis, normal hemoglobin, creatinine appears to be around baseline, no other acute electrolyte derangements, INR is therapeutic, no evidence of urinary tract infection.  CT shows mild height loss of the L4 compression fracture compared to 03/24/2018, unchanged appearance of L1 compression fracture.  There is mild spinal canal narrowing.  Pain improved.  Discussed with Dr. Darl Householder who saw and evaluated the patient as well.  Feel patient would benefit from gabapentin and lidocaine patches for continued pain management in addition to home Percocet.  Will provide referrals to orthopedics and neurosurgery for continued outpatient evaluation.  11:52 PM attempted to transfer patient to wheelchair for discharge, and patient has significant issues with mobility, he reports his pain is better but he still having trouble getting around, and wife reports that has been getting progressively worse over the past few days.  Wife reports she is the only one at home and she is not home 24/7 to help him, and he spends most of the day in his wheelchair.  Given that patient is on blood thinners, I am concerned for his safety going home.   Spoke with Dr. Hal Hope with [facility] who will see and admit the patient.  Final Clinical Impressions(s) / ED Diagnoses   Final diagnoses:  Chronic midline low back pain, with sciatica presence unspecified  Closed compression fracture of L4 lumbar vertebra with routine healing, subsequent encounter  Degenerative lumbar spinal stenosis    ED Discharge Orders    None       Janet Berlin 04/13/18 0028    Drenda Freeze, MD 04/14/18 1524

## 2018-04-12 NOTE — Discharge Instructions (Addendum)
In addition to your home pain medications please begin using gabapentin and lidocaine patches.  Your work-up today is overall reassuring, CT scan does show slight height loss of compression fracture and continued degenerative spinal changes.  You will need to follow-up with orthopedics and neurosurgery as well as your primary care doctor.  Return to the emergency department for weakness or numbness in the legs, loss of bowel or bladder control, fevers, abdominal pain or any other new or concerning symptoms.   Balloon Kyphoplasty, Care After Refer to this sheet in the next few weeks. These instructions provide you with information about caring for yourself after your procedure. Your health care provider may also give you more specific instructions. Your treatment has been planned according to current medical practices, but problems sometimes occur. Call your health care provider if you have any problems or questions after your procedure. What can I expect after the procedure? After your procedure, it is common to have back pain. Follow these instructions at home: Incision care  Follow instructions from your health care provider about how to take care of your incisions. Make sure you: ? Wash your hands with soap and water before you change your bandage (dressing). If soap and water are not available, use hand sanitizer. ? Change your dressing as told by your health care provider. ? Leave stitches (sutures), skin glue, or adhesive strips in place. These skin closures may need to be in place for 2 weeks or longer. If adhesive strip edges start to loosen and curl up, you may trim the loose edges. Do not remove adhesive strips completely unless your health care provider tells you to do that.  Check your incision area every day for signs of infection. Watch for: ? Redness, swelling, or pain. ? Fluid, blood, or pus.  Keep your dressing dry until your health care provider says that it can be  removed. Activity   Rest your back and avoid intense physical activity for as long as told by your health care provider.  Return to your normal activities as told by your health care provider. Ask your health care provider what activities are safe for you.  Do not lift anything that is heavier than 10 lb (4.5 kg). This is about the weight of a gallon of milk.You may need to avoid heavy lifting for several weeks. General instructions  Take over-the-counter and prescription medicines only as told by your health care provider.  If directed, apply ice to the painful area: ? Put ice in a plastic bag. ? Place a towel between your skin and the bag. ? Leave the ice on for 20 minutes, 2-3 times per day.  Do not use tobacco products, including cigarettes, chewing tobacco, or e-cigarettes. If you need help quitting, ask your health care provider.  Keep all follow-up visits as told by your health care provider. This is important. Contact a health care provider if:  You have a fever.  You have redness, swelling, or pain at the site of your incisions.  You have fluid, blood, or pus coming from your incisions.  You have pain that gets worse or does not get better with medicine.  You develop numbness or weakness in any part of your body. Get help right away if:  You have chest pain.  You have difficulty breathing.  You cannot move your legs.  You cannot control your bladder or bowel movements.  You suddenly become weak or numb on one side of your body.  You become  very confused.  You have trouble speaking or understanding, or both.

## 2018-04-13 ENCOUNTER — Observation Stay (HOSPITAL_COMMUNITY): Payer: Medicare HMO

## 2018-04-13 ENCOUNTER — Other Ambulatory Visit: Payer: Self-pay

## 2018-04-13 ENCOUNTER — Encounter (HOSPITAL_COMMUNITY): Payer: Self-pay | Admitting: Internal Medicine

## 2018-04-13 DIAGNOSIS — S32040A Wedge compression fracture of fourth lumbar vertebra, initial encounter for closed fracture: Secondary | ICD-10-CM | POA: Insufficient documentation

## 2018-04-13 DIAGNOSIS — E1159 Type 2 diabetes mellitus with other circulatory complications: Secondary | ICD-10-CM | POA: Diagnosis not present

## 2018-04-13 DIAGNOSIS — M160 Bilateral primary osteoarthritis of hip: Secondary | ICD-10-CM | POA: Diagnosis not present

## 2018-04-13 DIAGNOSIS — G8929 Other chronic pain: Secondary | ICD-10-CM | POA: Diagnosis not present

## 2018-04-13 DIAGNOSIS — M545 Low back pain, unspecified: Secondary | ICD-10-CM | POA: Diagnosis present

## 2018-04-13 DIAGNOSIS — S32040D Wedge compression fracture of fourth lumbar vertebra, subsequent encounter for fracture with routine healing: Secondary | ICD-10-CM | POA: Diagnosis not present

## 2018-04-13 LAB — CBC
HCT: 43.3 % (ref 39.0–52.0)
HEMOGLOBIN: 14.3 g/dL (ref 13.0–17.0)
MCH: 29.7 pg (ref 26.0–34.0)
MCHC: 33 g/dL (ref 30.0–36.0)
MCV: 89.8 fL (ref 78.0–100.0)
PLATELETS: 147 10*3/uL — AB (ref 150–400)
RBC: 4.82 MIL/uL (ref 4.22–5.81)
RDW: 13.3 % (ref 11.5–15.5)
WBC: 7.9 10*3/uL (ref 4.0–10.5)

## 2018-04-13 LAB — GLUCOSE, CAPILLARY
Glucose-Capillary: 128 mg/dL — ABNORMAL HIGH (ref 65–99)
Glucose-Capillary: 93 mg/dL (ref 65–99)
Glucose-Capillary: 101 mg/dL — ABNORMAL HIGH (ref 65–99)
Glucose-Capillary: 137 mg/dL — ABNORMAL HIGH (ref 65–99)
Glucose-Capillary: 151 mg/dL — ABNORMAL HIGH (ref 65–99)

## 2018-04-13 LAB — BASIC METABOLIC PANEL
ANION GAP: 13 (ref 5–15)
BUN: 21 mg/dL — ABNORMAL HIGH (ref 6–20)
CALCIUM: 9.2 mg/dL (ref 8.9–10.3)
CO2: 24 mmol/L (ref 22–32)
CREATININE: 1.31 mg/dL — AB (ref 0.61–1.24)
Chloride: 108 mmol/L (ref 101–111)
GFR calc non Af Amer: 55 mL/min — ABNORMAL LOW (ref >60)
Glucose, Bld: 117 mg/dL — ABNORMAL HIGH (ref 65–99)
Potassium: 4.3 mmol/L (ref 3.5–5.1)
SODIUM: 145 mmol/L (ref 135–145)

## 2018-04-13 LAB — PROTIME-INR
INR: 2.14
Prothrombin Time: 23.7 seconds — ABNORMAL HIGH (ref 11.4–15.2)

## 2018-04-13 LAB — HIV ANTIBODY (ROUTINE TESTING W REFLEX): HIV Screen 4th Generation wRfx: NONREACTIVE

## 2018-04-13 MED ORDER — ONDANSETRON HCL 4 MG/2ML IJ SOLN: 4.0000 mg | Freq: Four times a day (QID) | INTRAMUSCULAR | Status: DC | PRN

## 2018-04-13 MED ORDER — SERTRALINE HCL 50 MG PO TABS
150.0000 mg | ORAL_TABLET | Freq: Every day | ORAL | Status: DC
  Administered 2018-04-13 – 2018-04-19 (×7): 150 mg via ORAL
  Filled 2018-04-13 (×7): qty 3

## 2018-04-13 MED ORDER — INSULIN ASPART 100 UNIT/ML ~~LOC~~ SOLN
0.0000 [IU] | Freq: Three times a day (TID) | SUBCUTANEOUS | Status: DC
  Administered 2018-04-13: 1 [IU] via SUBCUTANEOUS
  Administered 2018-04-14 (×2): 2 [IU] via SUBCUTANEOUS
  Administered 2018-04-14: 3 [IU] via SUBCUTANEOUS
  Administered 2018-04-15: 2 [IU] via SUBCUTANEOUS
  Administered 2018-04-15: 1 [IU] via SUBCUTANEOUS
  Administered 2018-04-15 – 2018-04-16 (×2): 2 [IU] via SUBCUTANEOUS
  Administered 2018-04-16: 3 [IU] via SUBCUTANEOUS
  Administered 2018-04-16: 2 [IU] via SUBCUTANEOUS
  Administered 2018-04-17: 1 [IU] via SUBCUTANEOUS
  Administered 2018-04-17: 2 [IU] via SUBCUTANEOUS
  Administered 2018-04-17: 1 [IU] via SUBCUTANEOUS
  Administered 2018-04-18 (×3): 2 [IU] via SUBCUTANEOUS
  Administered 2018-04-19: 3 [IU] via SUBCUTANEOUS

## 2018-04-13 MED ORDER — HYDRALAZINE HCL 25 MG PO TABS
25.0000 mg | ORAL_TABLET | Freq: Two times a day (BID) | ORAL | Status: DC
  Administered 2018-04-13 – 2018-04-19 (×14): 25 mg via ORAL
  Filled 2018-04-13 (×14): qty 1

## 2018-04-13 MED ORDER — TRAMADOL HCL 50 MG PO TABS
100.0000 mg | ORAL_TABLET | Freq: Four times a day (QID) | ORAL | Status: DC | PRN
  Administered 2018-04-13 – 2018-04-18 (×3): 100 mg via ORAL
  Filled 2018-04-13 (×3): qty 2

## 2018-04-13 MED ORDER — ONDANSETRON HCL 4 MG PO TABS
4.0000 mg | ORAL_TABLET | Freq: Four times a day (QID) | ORAL | Status: DC | PRN
Start: 2018-04-13 — End: 2018-04-19

## 2018-04-13 MED ORDER — SIMVASTATIN 20 MG PO TABS
20.0000 mg | ORAL_TABLET | Freq: Every day | ORAL | Status: DC
  Administered 2018-04-13 – 2018-04-18 (×6): 20 mg via ORAL
  Filled 2018-04-13 (×7): qty 1

## 2018-04-13 MED ORDER — ACETAMINOPHEN 650 MG RE SUPP: 650.0000 mg | Freq: Four times a day (QID) | RECTAL | Status: DC | PRN

## 2018-04-13 MED ORDER — WARFARIN SODIUM 2.5 MG PO TABS
2.5000 mg | ORAL_TABLET | Freq: Once | ORAL | Status: AC
  Administered 2018-04-13: 2.5 mg via ORAL
  Filled 2018-04-13: qty 1

## 2018-04-13 MED ORDER — DOCUSATE SODIUM 100 MG PO CAPS
100.0000 mg | ORAL_CAPSULE | ORAL | Status: DC
  Administered 2018-04-14 – 2018-04-19 (×3): 100 mg via ORAL
  Filled 2018-04-13 (×4): qty 1

## 2018-04-13 MED ORDER — IRBESARTAN 75 MG PO TABS
37.5000 mg | ORAL_TABLET | Freq: Every day | ORAL | Status: DC
  Administered 2018-04-13 – 2018-04-19 (×7): 37.5 mg via ORAL
  Filled 2018-04-13 (×7): qty 1

## 2018-04-13 MED ORDER — METHYLPREDNISOLONE SODIUM SUCC 125 MG IJ SOLR
60.0000 mg | Freq: Four times a day (QID) | INTRAMUSCULAR | Status: DC
  Administered 2018-04-13 – 2018-04-14 (×5): 60 mg via INTRAVENOUS
  Filled 2018-04-13 (×5): qty 2

## 2018-04-13 MED ORDER — AMLODIPINE BESYLATE 5 MG PO TABS
10.0000 mg | ORAL_TABLET | Freq: Every day | ORAL | Status: DC
  Administered 2018-04-13 – 2018-04-19 (×7): 10 mg via ORAL
  Filled 2018-04-13 (×7): qty 2

## 2018-04-13 MED ORDER — VITAMIN D (ERGOCALCIFEROL) 1.25 MG (50000 UNIT) PO CAPS
50000.0000 [IU] | ORAL_CAPSULE | ORAL | Status: DC
  Administered 2018-04-16: 50000 [IU] via ORAL
  Filled 2018-04-13: qty 1

## 2018-04-13 MED ORDER — WARFARIN - PHARMACIST DOSING INPATIENT: Freq: Every day | Status: DC

## 2018-04-13 MED ORDER — METHOCARBAMOL 500 MG PO TABS
500.0000 mg | ORAL_TABLET | Freq: Three times a day (TID) | ORAL | Status: DC
  Administered 2018-04-13 – 2018-04-19 (×19): 500 mg via ORAL
  Filled 2018-04-13 (×20): qty 1

## 2018-04-13 MED ORDER — HYDROMORPHONE HCL 1 MG/ML IJ SOLN
1.0000 mg | Freq: Once | INTRAMUSCULAR | Status: AC
  Administered 2018-04-13: 1 mg via INTRAVENOUS
  Filled 2018-04-13: qty 1

## 2018-04-13 MED ORDER — BACLOFEN 10 MG PO TABS
20.0000 mg | ORAL_TABLET | Freq: Two times a day (BID) | ORAL | Status: DC
  Administered 2018-04-13 – 2018-04-14 (×4): 20 mg via ORAL
  Filled 2018-04-13 (×4): qty 2

## 2018-04-13 MED ORDER — FUROSEMIDE 40 MG PO TABS
20.0000 mg | ORAL_TABLET | Freq: Every day | ORAL | Status: DC
  Administered 2018-04-13 – 2018-04-19 (×6): 20 mg via ORAL
  Filled 2018-04-13 (×7): qty 1

## 2018-04-13 MED ORDER — ARIPIPRAZOLE 5 MG PO TABS
2.5000 mg | ORAL_TABLET | Freq: Every day | ORAL | Status: DC
  Administered 2018-04-13 – 2018-04-19 (×7): 2.5 mg via ORAL
  Filled 2018-04-13 (×7): qty 1

## 2018-04-13 MED ORDER — PIOGLITAZONE HCL 15 MG PO TABS
7.5000 mg | ORAL_TABLET | Freq: Every day | ORAL | Status: DC
  Administered 2018-04-13 – 2018-04-19 (×7): 7.5 mg via ORAL
  Filled 2018-04-13 (×7): qty 0.5

## 2018-04-13 MED ORDER — ACETAMINOPHEN 325 MG PO TABS
650.0000 mg | ORAL_TABLET | Freq: Four times a day (QID) | ORAL | Status: DC | PRN
Start: 2018-04-13 — End: 2018-04-19
  Administered 2018-04-13: 650 mg via ORAL
  Filled 2018-04-13: qty 2

## 2018-04-13 MED ORDER — LORAZEPAM 2 MG/ML IJ SOLN
1.0000 mg | Freq: Once | INTRAMUSCULAR | Status: AC
  Administered 2018-04-13: 2 mg via INTRAVENOUS
  Filled 2018-04-13: qty 1

## 2018-04-13 MED ORDER — ISOSORBIDE MONONITRATE ER 30 MG PO TB24
30.0000 mg | ORAL_TABLET | Freq: Every day | ORAL | Status: DC
  Administered 2018-04-13 – 2018-04-19 (×7): 30 mg via ORAL
  Filled 2018-04-13 (×7): qty 1

## 2018-04-13 MED ORDER — POTASSIUM CHLORIDE CRYS ER 20 MEQ PO TBCR
20.0000 meq | EXTENDED_RELEASE_TABLET | Freq: Every day | ORAL | Status: DC
  Administered 2018-04-13 – 2018-04-19 (×7): 20 meq via ORAL
  Filled 2018-04-13 (×7): qty 1

## 2018-04-13 NOTE — Progress Notes (Addendum)
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 massive bilateral pulmonary embolism 4/28 status post catheter directed TPA CKD 2 diabetes mellitus and chronic bradycardia-he returns to the hospital after inability to walk and severe pain-he states that tramadol helped him significantly when he was at home taking the pain 204  Nurse called me this morning saying that he was wheezing-those are transmitted upper airway sounds-he has not had a recent fall but I think that with his dense hemiparesis and inability to lift his right leg I would rule out an occult fracture on the right hip although this is less likely because of his unchanged exam from my admission 5/8  Plan Add Solu-Medrol 60, tramadol 50 every 6 as needed for moderate to severe pain (may use Tylenol for mild pain), get x-ray of hips, follow-up MRI back-RN aware and rounded with her PT has evaluated patient apparently and he is not a good candidate for going home Social work and care management to discuss options with the patient going forward  ADDEND--MRI shows Volume height loss ~ 40%, worse since prior--asking IR to eval if candidate Kypho  Pleas Koch, MD [facility] ([phone]

## 2018-04-13 NOTE — Progress Notes (Signed)
Patient prefers CSW to discuss bed offers w/ spouse. CSW left voicemail for patient spouse to discuss bed offers.   Vivi Barrack, Theresia Majors, MSW Clinical Social Worker  [phone] 04/13/2018  4:12 PM

## 2018-04-13 NOTE — Progress Notes (Signed)
ANTICOAGULATION CONSULT NOTE - Follow Up  Pharmacy Consult for Warfarin Indication: pulmonary embolus  Allergies  Allergen Reactions  . Penicillins Hives and Other (See Comments)    Has patient had a PCN reaction causing immediate rash, facial/tongue/throat swelling, SOB or lightheadedness with hypotension: no Has patient had a PCN reaction causing severe rash involving mucus membranes or skin necrosis: no Has patient had a PCN reaction that required hospitalization: no Has patient had a PCN reaction occurring within the last 10 years: no If all of the above answers are "NO", then may proceed with Cephalosporin use.     Patient Measurements: Height:  (170.2 cm) Weight: 257 lb (116.6 kg) IBW/kg (Calculated) : 66.1   Vital Signs: Temp: 99.1 F (37.3 C) (05/28 0600) Temp Source: Oral (05/28 0600) BP: 134/68 (05/28 0600) Pulse Rate: 81 (05/28 0600)  Labs: Recent Labs    04/12/18 2122 04/13/18 0501  HGB 15.5 14.3  HCT 45.5 43.3  PLT 164 147*  LABPROT 23.3* 23.7*  INR 2.08 2.14  CREATININE 1.33* 1.31*    Estimated Creatinine Clearance: 67.7 mL/min (A) (by C-G formula based on SCr of 1.31 mg/dL (H)).   Medical History: Past Medical History:  Diagnosis Date  . 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 Back spasm   . Chronic kidney disease (CKD), stage III (moderate) (HCC)   . Depression with anxiety   . Diabetes mellitus   . Dysarthria   . Dyslipidemia   . Hypertension   . Obesity   . OSA on CPAP   . Pulmonary emboli (HCC) 02/2014  . Stasis edema of both lower extremities   . Stroke [facility]) July 2013   left paramedian pontine, incidental right parietal subcortical. both d/t small vessel disease  . Stroke [facility]) March 2015   left basal ganglia secondary to small vessel disease    Medications:  Scheduled:  . amLODipine  10 mg Oral Daily  . ARIPiprazole  2.5 mg Oral Daily  . baclofen  20 mg Oral BID  . [START ON 04/14/2018] docusate  sodium  100 mg Oral Q M,W,F  . furosemide  20 mg Oral Daily  . hydrALAZINE  25 mg Oral BID  . insulin aspart  0-9 Units Subcutaneous TID WC  . irbesartan  37.5 mg Oral Daily  . isosorbide mononitrate  30 mg Oral Daily  . lidocaine  1 patch Transdermal Q24H  . LORazepam  1-2 mg Intravenous Once  . pioglitazone  7.5 mg Oral Daily  . potassium chloride SA  20 mEq Oral Daily  . sertraline  150 mg Oral Daily  . simvastatin  20 mg Oral q1800  . [START ON 04/16/2018] Vitamin D (Ergocalciferol)  50,000 Units Oral Q Fri   Infusions:    Assessment: 66 yoM on chronic warfarin for PE.  HD 5 mg M/W and 2.5 mg other days. LD 5/27 2200. INR=2.08 on admission.   Today, 04/13/18  INR therapeutic  Hgb stable  No reported bleeding  Goal of Therapy:  INR 2-3    Plan:  1) Continue with home regimen as above - 2.5mg  today 2) Daily INR   Hessie Knows, PharmD, BCPS Pager [phone] 04/13/2018 9:42 AM

## 2018-04-13 NOTE — Clinical Social Work Note (Addendum)
Clinical Social Work Assessment  Patient Details  Name: Dakota Hall MRN: [id number] Date of Birth: [birth year]  Date of referral:  04/13/18               Reason for consult:  Facility Placement                Permission sought to share information with:  Family Supports, Case Manager Permission granted to share information::  Yes, Release of Information Signed  Name::     Mickel Baas   Agency::     Relationship::  Spouse  Contact Information:     Housing/Transportation Living arrangements for the past 2 months:  [place] of Information:  Patient Patient Interpreter Needed:  None Criminal Activity/Legal Involvement Pertinent to Current Situation/Hospitalization:  No - Comment as needed Significant Relationships:  Spouse Lives with:  Spouse Do you feel safe going back to the place where you live?  Yes Need for family participation in patient care:  Yes (Comment)  Care giving concerns:  Patient was seen by CSW two weeks ago for to discuss disposition. Patient decline SNF and elected for Home Health services.  Patient admitted under oberservation for worsening pain  due to compression fracture of L4 lumbar vertebra. PT recommended SNF placement patient is agreeable this time to SNF. Patient reports at home he is fairly independent and uses a  Hemi walker.   Social Worker assessment / plan: CSW met with patient at bedside to discuss SNF options. Patient is agreeable to SNF for rehab. Patient reports the last time he was at facility was in 2015. He reports he did not have a great experience but optimistic about this time. Patient reports he prefers a facility close to his home if possible. CSW explain SNF process and later following up with bed offers. CSW explain the patient will need prior insurance authorization and this process can take up to 48 hours.    Plan: SNF  Employment status:  Retired Nurse, adult PT Recommendations:  [place] / Referral to community resources:     Patient/Family's Response to care:  Agreeable and Responding well to care.   Patient/Family's Understanding of and Emotional Response to Diagnosis, Current Treatment, and Prognosis:  Patient calm but optimistic about SNF placement. Patient has a good understanding of his diagnosis. He has referred CSW to discuss SNF options with his wife as well.   Emotional Assessment Appearance:  Appears stated age Attitude/Demeanor/Rapport:    Affect (typically observed):  Accepting, Calm Orientation:  Oriented to Self, Oriented to Place, Oriented to  Time, Oriented to Situation Alcohol / Substance use:  Not Applicable Psych involvement (Current and /or in the community):  No (Comment)  Discharge Needs  Concerns to be addressed:  Discharge Planning Concerns Readmission within the last 30 days:  Yes Current discharge risk:  Dependent with Mobility Barriers to Discharge:  Ship broker, Continued Medical Work up   [employer], [place] 04/13/2018, 12:28 PM

## 2018-04-13 NOTE — [place] FL2 (Signed)
[place] MEDICAID FL2 LEVEL OF CARE SCREENING TOOL     IDENTIFICATION  Patient Name: Dakota Hall Birthdate: 1951/04/01 Sex: male Admission Date (Current Location): 04/12/2018  [facility] and [place] Number:  Producer, television/film/video and Address:  [facility],  [address] [place] [address]      Provider Number: [id number]  Attending Physician Name and Address:  Rhetta Mura, MD  Relative Name and Phone Number:       Current Level of Care: Hospital Recommended Level of Care: Skilled Nursing Facility Prior Approval Number:    Date Approved/Denied:   PASRR Number:  [id number]   Discharge Plan:      Current Diagnoses: Patient Active Problem List   Diagnosis Date Noted  . Low back pain 04/13/2018  . Type 2 diabetes mellitus with vascular disease (HCC) 04/13/2018  . Closed compression fracture of fourth lumbar vertebra (HCC)   . Compression fracture of L4 lumbar vertebra, closed, initial encounter (HCC) 03/24/2018  . Lumbago 03/24/2018  . Pulmonary embolism (HCC) 05/03/2014  . AKI (acute kidney injury) (HCC) 04/22/2014  . Bradycardia 04/22/2014  . Encephalopathy 04/22/2014  . Malnutrition of moderate degree (HCC) 03/15/2014  . CVA (cerebral infarction) 02/08/2014  . Depression with anxiety   . 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 Obesity   . Stroke (HCC) 02/03/2014  . Unspecified cerebral artery occlusion with cerebral infarction 07/20/2013  . Stroke, small vessel (HCC) 05/18/2012  . Slurred speech 05/15/2012  . HTN (hypertension) 05/15/2012  . Noncompliance with medication regimen 05/15/2012  . DM (diabetes mellitus) (HCC) 05/15/2012  . Vision changes 05/15/2012    Orientation RESPIRATION BLADDER Height & Weight     Self, Time, Situation, Place  Normal Continent Weight: 257 lb (116.6 kg) Height:   (170.2 cm)  BEHAVIORAL SYMPTOMS/MOOD NEUROLOGICAL BOWEL NUTRITION STATUS      Continent Diet(See discharge Summary )  AMBULATORY STATUS  COMMUNICATION OF NEEDS Skin   Extensive Assist   Normal                       Personal Care Assistance Level of Assistance  Bathing, Feeding, Dressing Bathing Assistance: Limited assistance Feeding assistance: Independent Dressing Assistance: Limited assistance     Functional Limitations Info  Sight, Hearing, Speech Sight Info: Impaired Hearing Info: Adequate Speech Info: Adequate    SPECIAL CARE FACTORS FREQUENCY  PT (By licensed PT), OT (By licensed OT)     PT Frequency: 5x/week OT Frequency: 5x/week            Contractures Contractures Info: Not present    Additional Factors Info  Insulin Sliding Scale, Psychotropic Code Status Info: Fullcode Allergies Info: Allergies: Penicillins Psychotropic Info: Zoloft, Abilify, Ativan  Insulin Sliding Scale Info: Yes, see medication list.       Current Medications (04/13/2018):  This is the current hospital active medication list Current Facility-Administered Medications  Medication Dose Route Frequency Provider Last Rate Last Dose  . acetaminophen (TYLENOL) tablet 650 mg  650 mg Oral Q6H PRN Eduard Clos, MD   650 mg at 04/13/18 5621   Or  . acetaminophen (TYLENOL) suppository 650 mg  650 mg Rectal Q6H PRN Eduard Clos, MD      . amLODipine (NORVASC) tablet 10 mg  10 mg Oral Daily Eduard Clos, MD   10 mg at 04/13/18 1022  . ARIPiprazole (ABILIFY) tablet 2.5 mg  2.5 mg Oral Daily Eduard Clos, MD   2.5 mg at 04/13/18  1023  . baclofen (LIORESAL) tablet 20 mg  20 mg Oral BID Eduard Clos, MD   20 mg at 04/13/18 1019  . [START ON 04/14/2018] docusate sodium (COLACE) capsule 100 mg  100 mg Oral Q M,W,F Eduard Clos, MD      . furosemide (LASIX) tablet 20 mg  20 mg Oral Daily Eduard Clos, MD   20 mg at 04/13/18 1020  . hydrALAZINE (APRESOLINE) tablet 25 mg  25 mg Oral BID Eduard Clos, MD   25 mg at 04/13/18 1020  . insulin aspart (novoLOG) injection 0-9 Units   0-9 Units Subcutaneous TID WC Eduard Clos, MD      . irbesartan (AVAPRO) tablet 37.5 mg  37.5 mg Oral Daily Eduard Clos, MD   37.5 mg at 04/13/18 1021  . isosorbide mononitrate (IMDUR) 24 hr tablet 30 mg  30 mg Oral Daily Eduard Clos, MD   30 mg at 04/13/18 1020  . lidocaine (LIDODERM) 5 % 1 patch  1 patch Transdermal Q24H Eduard Clos, MD   1 patch at 04/12/18 2330  . LORazepam (ATIVAN) injection 1-2 mg  1-2 mg Intravenous Once Rhetta Mura, MD      . methocarbamol (ROBAXIN) tablet 500 mg  500 mg Oral TID Rhetta Mura, MD   500 mg at 04/13/18 1233  . methylPREDNISolone sodium succinate (SOLU-MEDROL) 125 mg/2 mL injection 60 mg  60 mg Intravenous Q6H Rhetta Mura, MD   60 mg at 04/13/18 1237  . ondansetron (ZOFRAN) tablet 4 mg  4 mg Oral Q6H PRN Eduard Clos, MD       Or  . ondansetron [facility]) injection 4 mg  4 mg Intravenous Q6H PRN Eduard Clos, MD      . pioglitazone (ACTOS) tablet 7.5 mg  7.5 mg Oral Daily Eduard Clos, MD   7.5 mg at 04/13/18 1023  . potassium chloride SA (K-DUR,KLOR-CON) CR tablet 20 mEq  20 mEq Oral Daily Eduard Clos, MD   20 mEq at 04/13/18 1021  . sertraline (ZOLOFT) tablet 150 mg  150 mg Oral Daily Eduard Clos, MD   150 mg at 04/13/18 1019  . simvastatin (ZOCOR) tablet 20 mg  20 mg Oral q1800 Eduard Clos, MD      . traMADol Janean Sark) tablet 100 mg  100 mg Oral Q6H PRN Rhetta Mura, MD      . Melene Muller ON 04/16/2018] Vitamin D (Ergocalciferol) (DRISDOL) capsule 50,000 Units  50,000 Units Oral Q Leeanne Deed, MD      . warfarin (COUMADIN) tablet 2.5 mg  2.5 mg Oral ONCE-1800 Hessie Knows, [facility]      . Warfarin - Pharmacist Dosing Inpatient   Does not apply q1800 Hessie Knows, [facility]         Discharge Medications: Please see discharge summary for a list of discharge medications.  Relevant Imaging Results:  Relevant Lab  Results:   Additional Information [phone]  Clearance Coots, LCSW

## 2018-04-13 NOTE — Progress Notes (Signed)
Pt. set up with CPAP per order, uses nasal pillows at home, set up with m/l nasal mask, room air, humidifier filled with s/w, tolerating well.

## 2018-04-13 NOTE — H&P (Signed)
History and Physical    Dakota Hall CNO:[id number] DOB: 29-Apr-1951 DOA: 04/12/2018  PCP: Leeroy Cha, MD  Patient coming from: Home.  Chief Complaint: Worsening back pain.  HPI: Dakota Hall is a 67 y.o. male with history of diabetes mellitus type 2, stroke with right-sided hemiplegia, pulmonary embolism, hypertension who was admitted and discharged on Mar 26, 2018 for L5 compression fracture presents to the ER back with worsening pain.  Patient states his pain worsened last few days.  Pain is worsened on trying to ambulate.  Denies any incontinence of urine or bowel.  ED Course: In the ER patient had CT lumbar spine which shows progression of the L4 compression fracture.  Since patient has worsening pain will admit for further observation likely may need placement.  Review of Systems: As per HPI, rest all negative.   Past Medical History:  Diagnosis Date  . 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 Back spasm   . Chronic kidney disease (CKD), stage III (moderate) (HCC)   . Depression with anxiety   . Diabetes mellitus   . Dysarthria   . Dyslipidemia   . Hypertension   . Obesity   . OSA on CPAP   . Pulmonary emboli ([place]) 02/2014  . Stasis edema of both lower extremities   . Stroke [facility]) July 2013   left paramedian pontine, incidental right parietal subcortical. both d/t small vessel disease  . Stroke [facility]) March 2015   left basal ganglia secondary to small vessel disease    Past Surgical History:  Procedure Laterality Date  . ANAL FISSURE REPAIR    . TONSILLECTOMY       reports that he has never smoked. He has never used smokeless tobacco. He reports that he does not drink alcohol or use drugs.  Allergies  Allergen Reactions  . Penicillins Hives and Other (See Comments)    Has patient had a PCN reaction causing immediate rash, facial/tongue/throat swelling, SOB or lightheadedness with hypotension: no Has patient had a PCN reaction  causing severe rash involving mucus membranes or skin necrosis: no Has patient had a PCN reaction that required hospitalization: no Has patient had a PCN reaction occurring within the last 10 years: no If all of the above answers are "NO", then may proceed with Cephalosporin use.     Family History  Problem Relation Age of Onset  . Cancer Mother   . Heart disease Father     Prior to Admission medications   Medication Sig Start Date End Date Taking? Authorizing Provider  amLODipine (NORVASC) 10 MG tablet Take 10 mg by mouth daily.   Yes [provider]  ARIPiprazole (ABILIFY) 5 MG tablet Take 2.5 mg by mouth daily.  03/22/18  Yes [provider]  baclofen (LIORESAL) 20 MG tablet Take 1 tablet (20 mg total) by mouth 3 (three) times daily. Patient taking differently: Take 20 mg by mouth 2 (two) times daily.  03/03/14  Yes Love, Ivan Anchors, PA-C  docusate sodium (COLACE) 100 MG capsule Take 100 mg by mouth every Monday, Wednesday, and Friday.    Yes [provider]  furosemide (LASIX) 20 MG tablet Take 20 mg by mouth daily. 03/11/18  Yes [provider]  hydrALAZINE (APRESOLINE) 25 MG tablet Take 25 mg by mouth 2 (two) times daily.  01/24/18  Yes [provider]  isosorbide mononitrate (IMDUR) 30 MG 24 hr tablet Take 30 mg by mouth daily. 01/31/18  Yes [provider]  olmesartan (BENICAR) 5 MG tablet Take 5 mg by mouth daily. 02/24/18  Yes [provider]  ondansetron (ZOFRAN) 4 MG tablet Take 4 mg by mouth every 6 (six) hours as needed for nausea or vomiting.  12/22/17  Yes [provider]  pioglitazone (ACTOS) 15 MG tablet Take 7.5 mg by mouth daily.    Yes [provider]  potassium chloride SA (K-DUR,KLOR-CON) 20 MEQ tablet Take 20 mEq by mouth daily. 02/16/18  Yes [provider]  sertraline (ZOLOFT) 100 MG tablet Take 150 mg by mouth daily.    Yes [provider]  simvastatin (ZOCOR) 20 MG tablet Take  1 tablet (20 mg total) by mouth daily at 6 PM. 05/18/12  Yes Debbe Odea, MD  Vitamin D, Ergocalciferol, (DRISDOL) 50000 UNITS CAPS capsule Take 50,000 Units by mouth every Friday.  06/26/13  Yes [provider]  warfarin (COUMADIN) 2.5 MG tablet Take 2.5-5 mg by mouth daily. Take two tablets on Monday and Wednesday and one tablet the rest of the week.   Yes [provider]  Blood Glucose Monitoring Suppl (ONE TOUCH ULTRA 2) W/DEVICE KIT  05/14/14   [provider]  gabapentin (NEURONTIN) 300 MG capsule Take 1 capsule (300 mg total) by mouth 3 (three) times daily for 15 days. Take 300 mg once on first day, then 300 mg twice a day on second day, and the three times daily 04/12/18 04/27/18  Jacqlyn Larsen, PA-C  lidocaine (LIDODERM) 5 % Place 1 patch onto the skin daily. Remove & Discard patch within 12 hours or as directed by MD 04/12/18   Jacqlyn Larsen, PA-C  Menthol, Topical Analgesic, (BENGAY EX) Apply 1 application topically as needed (For pain.).    [provider]  ONE TOUCH ULTRA TEST test strip  05/14/14   [provider]  Jonetta Speak LANCETS 59R [place]  05/14/14   [provider]    Physical Exam: Vitals:   04/12/18 1952 04/12/18 2248 04/12/18 2300 04/12/18 2330  BP:  (!) 168/85    Pulse:  67  79  Resp:  16 16   Temp:      TempSrc:      SpO2:  92%  95%  Weight: 116.6 kg (257 lb)     Height: '5\' 7"'  (1.702 m)         Constitutional: Moderately built and nourished. Vitals:   04/12/18 1952 04/12/18 2248 04/12/18 2300 04/12/18 2330  BP:  (!) 168/85    Pulse:  67  79  Resp:  16 16   Temp:      TempSrc:      SpO2:  92%  95%  Weight: 116.6 kg (257 lb)     Height: '5\' 7"'  (1.702 m)      Eyes: Anicteric no pallor. ENMT: No discharge from the ears eyes nose or mouth. Neck: No mass or.  No neck rigidity.  No JVD appreciated. Respiratory: No rhonchi or crepitations. Cardiovascular: S1-S2 heard no murmurs appreciated. Abdomen: Soft  nontender bowel sounds present. Musculoskeletal: No edema.  No joint effusion. Skin: No rash. Neurologic: Alert awake oriented to time place and person.  Right-sided hemiplegia from previous stroke. Psychiatric: Appears normal.  Normal affect.   Labs on Admission: I have personally reviewed following labs and imaging studies  CBC: Recent Labs  Lab 04/12/18 2122  WBC 8.9  HGB 15.5  HCT 45.5  MCV 88.9  PLT 416   Basic Metabolic Panel: Recent Labs  Lab 04/12/18  2122  NA 144  K 4.4  CL 108  CO2 24  GLUCOSE 111*  BUN 21*  CREATININE 1.33*  CALCIUM 9.7   GFR: Estimated Creatinine Clearance: 66.7 mL/min (A) (by C-G formula based on SCr of 1.33 mg/dL (H)). Liver Function Tests: No results for input(s): AST, ALT, ALKPHOS, BILITOT, PROT, ALBUMIN in the last 168 hours. No results for input(s): LIPASE, AMYLASE in the last 168 hours. No results for input(s): AMMONIA in the last 168 hours. Coagulation Profile: Recent Labs  Lab 04/12/18 2122  INR 2.08   Cardiac Enzymes: No results for input(s): CKTOTAL, CKMB, CKMBINDEX, TROPONINI in the last 168 hours. BNP (last 3 results) No results for input(s): PROBNP in the last 8760 hours. HbA1C: No results for input(s): HGBA1C in the last 72 hours. CBG: No results for input(s): GLUCAP in the last 168 hours. Lipid Profile: No results for input(s): CHOL, HDL, LDLCALC, TRIG, CHOLHDL, LDLDIRECT in the last 72 hours. Thyroid Function Tests: No results for input(s): TSH, T4TOTAL, FREET4, T3FREE, THYROIDAB in the last 72 hours. Anemia Panel: No results for input(s): VITAMINB12, FOLATE, FERRITIN, TIBC, IRON, RETICCTPCT in the last 72 hours. Urine analysis:    Component Value Date/Time   COLORURINE YELLOW 04/12/2018 2127   APPEARANCEUR CLEAR 04/12/2018 2127   LABSPEC 1.015 04/12/2018 2127   PHURINE 5.0 04/12/2018 2127   GLUCOSEU NEGATIVE 04/12/2018 2127   HGBUR SMALL (A) 04/12/2018 2127   BILIRUBINUR NEGATIVE 04/12/2018 2127    KETONESUR NEGATIVE 04/12/2018 2127   PROTEINUR NEGATIVE 04/12/2018 2127   UROBILINOGEN 1.0 04/22/2014 1340   NITRITE NEGATIVE 04/12/2018 2127   LEUKOCYTESUR NEGATIVE 04/12/2018 2127   Sepsis Labs: '@LABRCNTIP' (procalcitonin:4,lacticidven:4) )No results found for this or any previous visit (from the past 240 hour(s)).   Radiological Exams on Admission: Ct Lumbar Spine Wo Contrast  Result Date: 04/12/2018 CLINICAL DATA:  Worsening low back pain. History of L4 compression fracture. EXAM: CT LUMBAR SPINE WITHOUT CONTRAST TECHNIQUE: Multidetector CT imaging of the lumbar spine was performed without intravenous contrast administration. Multiplanar CT image reconstructions were also generated. COMPARISON:  CT abdomen pelvis 03/24/2018 FINDINGS: Segmentation: 5 lumbar type vertebrae. Alignment: Normal. Vertebrae: Chronic compression deformities of L1 and L4. There has been progression of L4 height loss compared to 03/24/2018. No new compression fracture. Paraspinal and other soft tissues: Atherosclerotic calcification of the abdominal aorta. Disc levels: Mild narrowing of the spinal canal at L3-L4 and L4-L5. Mild-to-moderate bilateral L4-5 foraminal stenosis. Otherwise, the spinal canal is widely patent. IMPRESSION: 1. Mild progression of height loss at L4 compared to the 03/24/2018 study. Unchanged appearance of L1 compression fracture. 2. Mild spinal canal narrowing at L3-4 and L4-5 with mild-to-moderate bilateral L4-5 foraminal stenosis. 3.  Aortic Atherosclerosis (ICD10-I70.0). Electronically Signed   By: Ulyses Jarred M.D.   On: 04/12/2018 23:00    Assessment/Plan Principal Problem:   Low back pain Active Problems:   Pulmonary embolism (HCC)   Type 2 diabetes mellitus with vascular disease ([place])    1. Low back pain with L4 compression fracture which is showing progression -we will get physical therapy consult keep patient on pain relief medications.  Likely may need placement.  Will check MRI  L-spine. 2. History of pulmonary embolism on Coumadin which will be dosed per pharmacy. 3. Diabetes mellitus type 2 on Actos. 4. Hypertension on ARB amlodipine and hydralazine. 5. History of stroke with right-sided hemiplegia.  On Coumadin. 6. Bipolar disorder on Abilify and Zoloft.   DVT prophylaxis: Coumadin. Code Status: Full code. Family Communication:  Discussed with patient. Disposition Plan: To be determined. Consults called: Physical therapy and social work. Admission status: Observation.   Rise Patience MD [facility] Pager [phone].  If 7PM-7AM, please contact night-coverage [email or web address] Password [email or web address]  04/13/2018, 1:33 AM

## 2018-04-13 NOTE — Evaluation (Signed)
Physical Therapy Evaluation Patient Details Name: Dakota Hall MRN: [id number] DOB: [birth year] Today's Date: 04/13/2018   History of Present Illness  67 yo male admitted with weakness, low back pain. Imaging (+) chronic L1, L4 compression fractures. Hx of CVA with R hemiplegia, DM, OSA, CKD    Clinical Impression  On eval, pt required Mod assist for rolling and Max assist +2 for side lying<>sittting bed mobility. Mobility is currently limited by pain. Pt also has a history of R hemiplegia 2* CVA which is also affecting his mobility. No family present during session. Discussed d/c plan-pt is unsure at time of eval. Will continue to follow and progress activity as tolerated. Recommendation is for ST rehab at [facility].     Follow Up Recommendations SNF    Equipment Recommendations  None recommended by PT    Recommendations for Other Services       Precautions / Restrictions Precautions Precautions: Fall Precaution Comments: issued a TLSO last admission. Brace not present in room on eval 5/28 Restrictions Weight Bearing Restrictions: No      Mobility  Bed Mobility Overal bed mobility: Needs Assistance Bed Mobility: Rolling;Sidelying to Sit;Sit to Sidelying Rolling: Mod assist Sidelying to sit: Max assist;+2 for physical assistance;+2 for safety/equipment;HOB elevated     Sit to sidelying: Max assist;+2 for physical assistance;+2 for safety/equipment;HOB elevated General bed mobility comments: Min assist and increased time to roll to R (pt uses bedrail). Mod assist + pad to roll to L side. Assist for trunk and bil LEs to sit EOB. Pt only able to tolerate getting to partial sitting before sharp pain. Pt requested to return to supine.   Transfers                 General transfer comment: NT-pt unable due to pain  Ambulation/Gait                Stairs            Wheelchair Mobility    Modified Rankin (Stroke Patients Only)       Balance                                              Pertinent Vitals/Pain Pain Assessment: Faces Faces Pain Scale: Hurts whole lot Pain Location: back with activity Pain Descriptors / Indicators: Grimacing;Guarding;Sharp;Discomfort Pain Intervention(s): Limited activity within patient's tolerance;Repositioned;Patient requesting pain meds-RN notified    Home Living Family/patient expects to be discharged to:: Unsure Living Arrangements: Spouse/significant other(wife available in evenings) Available Help at Discharge: Family Type of Home: House Home Access: Ramped entrance     Home Layout: Two level Home Equipment: Wheelchair - manual;Hospital bed(hemi walker, lift chair) Additional Comments: has help at night (wife). cousin lives across the street.  Pt can have a first floor set up.  His hospital bed is upstairs (has trapeze); pt used rail to get up stairs and bumped down on his bottom using a large spool to lower himself to stair level    Prior Function Level of Independence: Needs assistance   Gait / Transfers Assistance Needed: uses hemiwalker vs wheelchair           Hand Dominance   Dominant Hand: Right    Extremity/Trunk Assessment   Upper Extremity Assessment Upper Extremity Assessment: RUE deficits/detail RUE Deficits / Details: no functional use    Lower Extremity Assessment Lower  Extremity Assessment: RLE deficits/detail RLE Deficits / Details: limited functional use RLE Coordination: decreased gross motor;decreased fine motor       Communication   Communication: Expressive difficulties(mild expressive difficulties)  Cognition Arousal/Alertness: Awake/alert Behavior During Therapy: WFL for tasks assessed/performed Overall Cognitive Status: Within Functional Limits for tasks assessed                                        General Comments      Exercises     Assessment/Plan    PT Assessment Patient needs continued PT services  PT Problem  List Decreased strength;Impaired tone;Decreased range of motion;Decreased knowledge of use of DME;Decreased activity tolerance;Decreased knowledge of precautions;Decreased mobility;Decreased coordination;Pain;Decreased balance       PT Treatment Interventions DME instruction;Functional mobility training;Therapeutic activities;Patient/family education;Balance training;Gait training;Therapeutic exercise;Wheelchair mobility training    PT Goals (Current goals can be found in the Care Plan section)  Acute Rehab PT Goals Patient Stated Goal: home PT Goal Formulation: With patient Time For Goal Achievement: 04/27/18 Potential to Achieve Goals: Fair    Frequency Min 3X/week   Barriers to discharge Decreased caregiver support      Co-evaluation               AM-PAC PT "6 Clicks" Daily Activity  Outcome Measure Difficulty turning over in bed (including adjusting bedclothes, sheets and blankets)?: Unable Difficulty moving from lying on back to sitting on the side of the bed? : Unable Difficulty sitting down on and standing up from a chair with arms (e.g., wheelchair, bedside commode, etc,.)?: Unable Help needed moving to and from a bed to chair (including a wheelchair)?: Total Help needed walking in hospital room?: Total Help needed climbing 3-5 steps with a railing? : Total 6 Click Score: 6    End of Session   Activity Tolerance: Patient limited by pain Patient left: in bed;with call bell/phone within reach   PT Visit Diagnosis: Pain;Hemiplegia and hemiparesis Hemiplegia - dominant/non-dominant: Dominant Hemiplegia - caused by: Unspecified Pain - part of body: (back)    Time: 4315-4008 PT Time Calculation (min) (ACUTE ONLY): 18 min   Charges:   PT Evaluation $PT Eval Moderate Complexity: 1 Mod     PT G Codes:          Rebeca Alert, MPT Pager: [phone]

## 2018-04-13 NOTE — Progress Notes (Signed)
ANTICOAGULATION CONSULT NOTE - Initial Consult  Pharmacy Consult for Warfarin Indication: pulmonary embolus  Allergies  Allergen Reactions  . Penicillins Hives and Other (See Comments)    Has patient had a PCN reaction causing immediate rash, facial/tongue/throat swelling, SOB or lightheadedness with hypotension: no Has patient had a PCN reaction causing severe rash involving mucus membranes or skin necrosis: no Has patient had a PCN reaction that required hospitalization: no Has patient had a PCN reaction occurring within the last 10 years: no If all of the above answers are "NO", then may proceed with Cephalosporin use.     Patient Measurements: Height:  (170.2 cm) Weight: 257 lb (116.6 kg) IBW/kg (Calculated) : 66.1   Vital Signs: Temp: 98.6 F (37 C) (05/27 1950) Temp Source: Oral (05/27 1950) BP: 168/85 (05/27 2248) Pulse Rate: 79 (05/27 2330)  Labs: Recent Labs    04/12/18 2122  HGB 15.5  HCT 45.5  PLT 164  LABPROT 23.3*  INR 2.08  CREATININE 1.33*    Estimated Creatinine Clearance: 66.7 mL/min (A) (by C-G formula based on SCr of 1.33 mg/dL (H)).   Medical History: Past Medical History:  Diagnosis Date  . 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 Back spasm   . Chronic kidney disease (CKD), stage III (moderate) (HCC)   . Depression with anxiety   . Diabetes mellitus   . Dysarthria   . Dyslipidemia   . Hypertension   . Obesity   . OSA on CPAP   . Pulmonary emboli (HCC) 02/2014  . Stasis edema of both lower extremities   . Stroke [facility]) July 2013   left paramedian pontine, incidental right parietal subcortical. both d/t small vessel disease  . Stroke [facility]) March 2015   left basal ganglia secondary to small vessel disease    Medications:  Scheduled:  . lidocaine  1 patch Transdermal Q24H   Infusions:    Assessment: 66 yoM on chronic warfarin for PE.  HD 5 mg M/W and 2.5 mg other days. LD 5/27 2200. INR=2.08 on  admission.  Goal of Therapy:  INR 2-3    Plan:  Daily PT/INR  Susanne Greenhouse R 04/13/2018,12:30 AM

## 2018-04-13 NOTE — ED Notes (Signed)
ED TO INPATIENT HANDOFF REPORT  Name/Age/Gender Dakota Hall 67 y.o. male  Code Status Code Status History    Date Active Date Inactive Code Status Order ID Comments User Context   03/24/2018 1611 03/26/2018 1940 Full Code [id number]  Nita Sells, MD Inpatient   04/22/2014 2029 04/27/2014 1921 Full Code [id number]  Elmarie Shiley, MD Inpatient   03/15/2014 1414 03/22/2014 1756 Full Code [id number]  Marybelle Killings, MD Inpatient   03/14/2014 2331 03/15/2014 1414 Full Code [id number]  Greggory Keen, MD Inpatient   03/14/2014 2214 03/14/2014 2331 Full Code [id number]  Jacqulynn Cadet, MD Inpatient   03/14/2014 1518 03/14/2014 2214 Full Code [id number]  Erick Colace, NP ED   02/08/2014 1742 03/03/2014 1932 Full Code [id number]  Love, Ivan Anchors, PA-C Inpatient      Home/SNF/Other Home  Chief Complaint Back pain  Level of Care/Admitting Diagnosis ED Disposition    ED Disposition Condition [place] [facility]: [facility] [[id number]]  Level of Care: Med-Surg [16]  Diagnosis: Low back pain [845364]  Admitting Physician: Rise Patience [phone]  Attending Physician: Rise Patience [phone]  PT Class (Do Not Modify): Observation [104]  PT Acc Code (Do Not Modify): Observation [10022]       Medical History Past Medical History:  Diagnosis Date  . 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 Back spasm   . Chronic kidney disease (CKD), stage III (moderate) (HCC)   . Depression with anxiety   . Diabetes mellitus   . Dysarthria   . Dyslipidemia   . Hypertension   . Obesity   . OSA on CPAP   . Pulmonary emboli ([place]) 02/2014  . Stasis edema of both lower extremities   . Stroke [facility]) July 2013   left paramedian pontine, incidental right parietal subcortical. both d/t small vessel disease  . Stroke [facility]) March 2015   left basal ganglia secondary to small vessel disease    Allergies Allergies  Allergen Reactions  .  Penicillins Hives and Other (See Comments)    Has patient had a PCN reaction causing immediate rash, facial/tongue/throat swelling, SOB or lightheadedness with hypotension: no Has patient had a PCN reaction causing severe rash involving mucus membranes or skin necrosis: no Has patient had a PCN reaction that required hospitalization: no Has patient had a PCN reaction occurring within the last 10 years: no If all of the above answers are "NO", then may proceed with Cephalosporin use.     IV Location/Drains/Wounds Patient Lines/Drains/Airways Status   Active Line/Drains/Airways    None          Labs/Imaging Results for orders placed or performed during the hospital encounter of 04/12/18 (from the past 48 hour(s))  Basic metabolic panel     Status: Abnormal   Collection Time: 04/12/18  9:22 PM  Result Value Ref Range   Sodium 144 135 - 145 mmol/L   Potassium 4.4 3.5 - 5.1 mmol/L   Chloride 108 101 - 111 mmol/L   CO2 24 22 - 32 mmol/L   Glucose, Bld 111 (H) 65 - 99 mg/dL   BUN 21 (H) 6 - 20 mg/dL   Creatinine, Ser 1.33 (H) 0.61 - 1.24 mg/dL   Calcium 9.7 8.9 - 10.3 mg/dL   GFR calc non Af Amer 54 (L) >60 mL/min   GFR calc Af Amer >60 >60 mL/min    Comment: (NOTE) The eGFR has been calculated using the CKD EPI equation. This  calculation has not been validated in all clinical situations. eGFR's persistently <60 mL/min signify possible Chronic Kidney Disease.    Anion gap 12 5 - 15    Comment: Performed at [facility], [place] [address] [place], [place] [address]  CBC     Status: None   Collection Time: 04/12/18  9:22 PM  Result Value Ref Range   WBC 8.9 4.0 - 10.5 K/uL   RBC 5.12 4.22 - 5.81 MIL/uL   Hemoglobin 15.5 13.0 - 17.0 g/dL   HCT 45.5 39.0 - 52.0 %   MCV 88.9 78.0 - 100.0 fL   MCH 30.3 26.0 - 34.0 pg   MCHC 34.1 30.0 - 36.0 g/dL   RDW 13.2 11.5 - 15.5 %   Platelets 164 150 - 400 K/uL    Comment: Performed at [facility],  [place] [address] [place], [place] [address]  Protime-INR     Status: Abnormal   Collection Time: 04/12/18  9:22 PM  Result Value Ref Range   Prothrombin Time 23.3 (H) 11.4 - 15.2 seconds   INR 2.08     Comment: Performed at [facility], [place] [address], [place], [place] [address]  Urinalysis, Routine w reflex microscopic     Status: Abnormal   Collection Time: 04/12/18  9:27 PM  Result Value Ref Range   Color, Urine YELLOW YELLOW   APPearance CLEAR CLEAR   Specific Gravity, Urine 1.015 1.005 - 1.030   pH 5.0 5.0 - 8.0   Glucose, UA NEGATIVE NEGATIVE mg/dL   Hgb urine dipstick SMALL (A) NEGATIVE   Bilirubin Urine NEGATIVE NEGATIVE   Ketones, ur NEGATIVE NEGATIVE mg/dL   Protein, ur NEGATIVE NEGATIVE mg/dL   Nitrite NEGATIVE NEGATIVE   Leukocytes, UA NEGATIVE NEGATIVE   RBC / HPF 0-5 0 - 5 RBC/hpf   WBC, UA 0-5 0 - 5 WBC/hpf   Bacteria, UA NONE SEEN NONE SEEN   Mucus PRESENT    Hyaline Casts, UA PRESENT     Comment: Performed at [facility], [place] [address], [place], [place] [address]   Ct Lumbar Spine Wo Contrast  Result Date: 04/12/2018 CLINICAL DATA:  Worsening low back pain. History of L4 compression fracture. EXAM: CT LUMBAR SPINE WITHOUT CONTRAST TECHNIQUE: Multidetector CT imaging of the lumbar spine was performed without intravenous contrast administration. Multiplanar CT image reconstructions were also generated. COMPARISON:  CT abdomen pelvis 03/24/2018 FINDINGS: Segmentation: 5 lumbar type vertebrae. Alignment: Normal. Vertebrae: Chronic compression deformities of L1 and L4. There has been progression of L4 height loss compared to 03/24/2018. No new compression fracture. Paraspinal and other soft tissues: Atherosclerotic calcification of the abdominal aorta. Disc levels: Mild narrowing of the spinal canal at L3-L4 and L4-L5. Mild-to-moderate bilateral L4-5 foraminal stenosis. Otherwise, the spinal canal is widely patent. IMPRESSION: 1. Mild  progression of height loss at L4 compared to the 03/24/2018 study. Unchanged appearance of L1 compression fracture. 2. Mild spinal canal narrowing at L3-4 and L4-5 with mild-to-moderate bilateral L4-5 foraminal stenosis. 3.  Aortic Atherosclerosis (ICD10-I70.0). Electronically Signed   By: Ulyses Jarred M.D.   On: 04/12/2018 23:00    Pending Labs Unresulted Labs (From admission, onward)   Start     Ordered   04/13/18 0500  Protime-INR  Daily,   R     04/13/18 0032      Vitals/Pain Today's Vitals   04/12/18 2248 04/12/18 2300 04/12/18 2308 04/12/18 2330  BP: (!) 168/85  Pulse: 67   79  Resp: 16 16    Temp:      TempSrc:      SpO2: 92%   95%  Weight:      Height:      PainSc:   6      Isolation Precautions No active isolations  Medications Medications  lidocaine (LIDODERM) 5 % 1 patch (1 patch Transdermal Patch Applied 04/12/18 2330)  fentaNYL (SUBLIMAZE) injection 50 mcg (50 mcg Intravenous Given 04/12/18 2119)  ondansetron (ZOFRAN) injection 4 mg (4 mg Intravenous Given 04/12/18 2119)  HYDROmorphone (DILAUDID) injection 1 mg (1 mg Intravenous Given 04/12/18 2209)    Mobility non-ambulatory

## 2018-04-14 DIAGNOSIS — I10 Essential (primary) hypertension: Secondary | ICD-10-CM

## 2018-04-14 DIAGNOSIS — I679 Cerebrovascular disease, unspecified: Secondary | ICD-10-CM | POA: Diagnosis present

## 2018-04-14 DIAGNOSIS — Z880 Allergy status to penicillin: Secondary | ICD-10-CM | POA: Diagnosis not present

## 2018-04-14 DIAGNOSIS — I872 Venous insufficiency (chronic) (peripheral): Secondary | ICD-10-CM | POA: Diagnosis present

## 2018-04-14 DIAGNOSIS — G4733 Obstructive sleep apnea (adult) (pediatric): Secondary | ICD-10-CM | POA: Diagnosis present

## 2018-04-14 DIAGNOSIS — E669 Obesity, unspecified: Secondary | ICD-10-CM | POA: Diagnosis present

## 2018-04-14 DIAGNOSIS — F319 Bipolar disorder, unspecified: Secondary | ICD-10-CM

## 2018-04-14 DIAGNOSIS — E1159 Type 2 diabetes mellitus with other circulatory complications: Secondary | ICD-10-CM | POA: Diagnosis not present

## 2018-04-14 DIAGNOSIS — I69351 Hemiplegia and hemiparesis following cerebral infarction affecting right dominant side: Secondary | ICD-10-CM | POA: Diagnosis not present

## 2018-04-14 DIAGNOSIS — N183 Chronic kidney disease, stage 3 (moderate): Secondary | ICD-10-CM

## 2018-04-14 DIAGNOSIS — Z79899 Other long term (current) drug therapy: Secondary | ICD-10-CM | POA: Diagnosis not present

## 2018-04-14 DIAGNOSIS — I2699 Other pulmonary embolism without acute cor pulmonale: Secondary | ICD-10-CM

## 2018-04-14 DIAGNOSIS — Z9989 Dependence on other enabling machines and devices: Secondary | ICD-10-CM | POA: Diagnosis not present

## 2018-04-14 DIAGNOSIS — Z7901 Long term (current) use of anticoagulants: Secondary | ICD-10-CM | POA: Diagnosis not present

## 2018-04-14 DIAGNOSIS — E1122 Type 2 diabetes mellitus with diabetic chronic kidney disease: Secondary | ICD-10-CM | POA: Diagnosis present

## 2018-04-14 DIAGNOSIS — I129 Hypertensive chronic kidney disease with stage 1 through stage 4 chronic kidney disease, or unspecified chronic kidney disease: Secondary | ICD-10-CM | POA: Diagnosis present

## 2018-04-14 DIAGNOSIS — E785 Hyperlipidemia, unspecified: Secondary | ICD-10-CM | POA: Diagnosis present

## 2018-04-14 DIAGNOSIS — Z6841 Body Mass Index (BMI) 40.0 and over, adult: Secondary | ICD-10-CM | POA: Diagnosis not present

## 2018-04-14 DIAGNOSIS — F418 Other specified anxiety disorders: Secondary | ICD-10-CM | POA: Diagnosis present

## 2018-04-14 DIAGNOSIS — R791 Abnormal coagulation profile: Secondary | ICD-10-CM | POA: Diagnosis not present

## 2018-04-14 DIAGNOSIS — E1151 Type 2 diabetes mellitus with diabetic peripheral angiopathy without gangrene: Secondary | ICD-10-CM | POA: Diagnosis present

## 2018-04-14 DIAGNOSIS — G8929 Other chronic pain: Secondary | ICD-10-CM | POA: Diagnosis present

## 2018-04-14 DIAGNOSIS — M48061 Spinal stenosis, lumbar region without neurogenic claudication: Secondary | ICD-10-CM | POA: Diagnosis present

## 2018-04-14 DIAGNOSIS — I69398 Other sequelae of cerebral infarction: Secondary | ICD-10-CM | POA: Diagnosis not present

## 2018-04-14 DIAGNOSIS — M62830 Muscle spasm of back: Secondary | ICD-10-CM | POA: Diagnosis present

## 2018-04-14 DIAGNOSIS — M5442 Lumbago with sciatica, left side: Secondary | ICD-10-CM

## 2018-04-14 DIAGNOSIS — Z8673 Personal history of transient ischemic attack (TIA), and cerebral infarction without residual deficits: Secondary | ICD-10-CM | POA: Diagnosis not present

## 2018-04-14 DIAGNOSIS — Z86711 Personal history of pulmonary embolism: Secondary | ICD-10-CM | POA: Diagnosis not present

## 2018-04-14 DIAGNOSIS — S32040D Wedge compression fracture of fourth lumbar vertebra, subsequent encounter for fracture with routine healing: Secondary | ICD-10-CM | POA: Diagnosis not present

## 2018-04-14 DIAGNOSIS — M4856XA Collapsed vertebra, not elsewhere classified, lumbar region, initial encounter for fracture: Secondary | ICD-10-CM | POA: Diagnosis present

## 2018-04-14 LAB — GLUCOSE, CAPILLARY
GLUCOSE-CAPILLARY: 169 mg/dL — AB (ref 65–99)
GLUCOSE-CAPILLARY: 217 mg/dL — AB (ref 65–99)
GLUCOSE-CAPILLARY: 189 mg/dL — AB (ref 65–99)
Glucose-Capillary: 182 mg/dL — ABNORMAL HIGH (ref 65–99)

## 2018-04-14 LAB — PROTIME-INR
INR: 2.32
Prothrombin Time: 25.2 seconds — ABNORMAL HIGH (ref 11.4–15.2)

## 2018-04-14 MED ORDER — METHYLPREDNISOLONE SODIUM SUCC 125 MG IJ SOLR
60.0000 mg | Freq: Two times a day (BID) | INTRAMUSCULAR | Status: DC
  Administered 2018-04-15 – 2018-04-17 (×7): 60 mg via INTRAVENOUS
  Filled 2018-04-14 (×7): qty 2

## 2018-04-14 NOTE — Progress Notes (Signed)
ANTICOAGULATION CONSULT NOTE - Follow Up  Pharmacy Consult for Warfarin - holding for possible surgery, to start IV UFH when INR < 2 Indication: pulmonary embolus  Allergies  Allergen Reactions  . Penicillins Hives and Other (See Comments)    Has patient had a PCN reaction causing immediate rash, facial/tongue/throat swelling, SOB or lightheadedness with hypotension: no Has patient had a PCN reaction causing severe rash involving mucus membranes or skin necrosis: no Has patient had a PCN reaction that required hospitalization: no Has patient had a PCN reaction occurring within the last 10 years: no If all of the above answers are "NO", then may proceed with Cephalosporin use.     Patient Measurements: Height:  (170.2 cm) Weight: 257 lb (116.6 kg) IBW/kg (Calculated) : 66.1   Vital Signs: Temp: 97.9 F (36.6 C) (05/29 0454) Temp Source: Oral (05/29 0454) BP: 159/77 (05/29 0454) Pulse Rate: 69 (05/29 0454)  Labs: Recent Labs    04/12/18 2122 04/13/18 0501 04/14/18 0413  HGB 15.5 14.3  --   HCT 45.5 43.3  --   PLT 164 147*  --   LABPROT 23.3* 23.7* 25.2*  INR 2.08 2.14 2.32  CREATININE 1.33* 1.31*  --     Estimated Creatinine Clearance: 67.7 mL/min (A) (by C-G formula based on SCr of 1.31 mg/dL (H)).   Medical History: Past Medical History:  Diagnosis Date  . 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 Back spasm   . Chronic kidney disease (CKD), stage III (moderate) (HCC)   . Depression with anxiety   . Diabetes mellitus   . Dysarthria   . Dyslipidemia   . Hypertension   . Obesity   . OSA on CPAP   . Pulmonary emboli (HCC) 02/2014  . Stasis edema of both lower extremities   . Stroke [facility]) July 2013   left paramedian pontine, incidental right parietal subcortical. both d/t small vessel disease  . Stroke [facility]) March 2015   left basal ganglia secondary to small vessel disease    Medications:  Scheduled:  . amLODipine  10 mg  Oral Daily  . ARIPiprazole  2.5 mg Oral Daily  . baclofen  20 mg Oral BID  . docusate sodium  100 mg Oral Q M,W,F  . furosemide  20 mg Oral Daily  . hydrALAZINE  25 mg Oral BID  . insulin aspart  0-9 Units Subcutaneous TID WC  . irbesartan  37.5 mg Oral Daily  . isosorbide mononitrate  30 mg Oral Daily  . lidocaine  1 patch Transdermal Q24H  . methocarbamol  500 mg Oral TID  . methylPREDNISolone (SOLU-MEDROL) injection  60 mg Intravenous Q6H  . pioglitazone  7.5 mg Oral Daily  . potassium chloride SA  20 mEq Oral Daily  . sertraline  150 mg Oral Daily  . simvastatin  20 mg Oral q1800  . [START ON 04/16/2018] Vitamin D (Ergocalciferol)  50,000 Units Oral Q Fri   Infusions:    Assessment: 66 yoM on chronic warfarin for PE.  HD 5 mg M/W and 2.5 mg other days. LD 5/27 2200. INR=2.08 on admission.   Today, 04/14/18  INR still > 2 - orders to start IV heparin once INR < 2  Hgb stable  No reported bleeding  Goal of Therapy:  INR 2-3    Plan:  1) NO warfarin today 2) Daily INR 3) Await plans for possible surgery   Hessie Knows, PharmD, BCPS Pager [phone] 04/14/2018 10:22 AM

## 2018-04-14 NOTE — Progress Notes (Signed)
PROGRESS NOTE    Dakota Hall  WUJ:[id number] DOB: 05-08-1951 DOA: 04/12/2018 PCP: Lorenda Ishihara, MD   Brief Narrative:  HPI On 5/28/219 by Dr. Midge Minium Dakota Hall is a 67 y.o. male with history of diabetes mellitus type 2, stroke with right-sided hemiplegia, pulmonary embolism, hypertension who was admitted and discharged on Mar 26, 2018 for L5 compression fracture presents to the ER back with worsening pain.  Patient states his pain worsened last few days.  Pain is worsened on trying to ambulate.  Denies any incontinence of urine or bowel.  Interim history Found to have L4 compression fracture.  Interventional radiology consulted for possible kyphoplasty.  Assessment & Plan   Lower back pain with L4 compression fracture -CT lumbar spine showed mild progression of height loss at L4 compared to 03/24/2018.  Unchanged appearance of L1 compression fracture.  Mild spinal canal narrowing L3-4, L4-5 with mild to moderate bilateral L4-5 foraminal stenosis. -MRI lumbar spine showed acute or subacute compression fractures of L3 and L4 with vertebral body height loss at L3 approximately 40% -Interventional radiology consulted and appreciated for possible kyphoplasty, currently pending -PT consulted recommended SNF -Social work consult -Continue pain control.  Patient started on Solu-Medrol to aid with pain (will wean)  History of pulmonary embolism -Patient on Coumadin, received 1 dose during hospitalization -INR currently 2.32  Diabetes mellitus, type II -Continue Actos -Given that patient was placed on IV Solu-Medrol, will place on insulin sliding scale with CBG monitoring  Essential hypertension -Continue amlodipine, hydralazine, Avapro, imdur, lasix  History of stroke -With right-sided hemi-plegia -As above, patient on Coumadin which is currently held -Continue statin  Bipolar disorder -Stable, continue Abilify and Zoloft  CKD, stage III -Stable, continue to  monitor BMP  DVT Prophylaxis  Therapeutic INR  Code Status: Full  Family Communication: None at bedside  Disposition Plan: Observation. Currently pending IR consultation and SNF placement  Consultants Interventional radiology  Procedures  None  Antibiotics   Anti-infectives (From admission, onward)   None      Subjective:   Dakota Hall seen and examined today.  Patient complained of back pain.  States pain is worse with movement.  Denies current chest pain, shortness of breath, abdominal pain, nausea or vomiting, diarrhea or constipation.  Objective:   Vitals:   04/13/18 1349 04/13/18 1357 04/13/18 2135 04/14/18 0454  BP: (!) 122/57  (!) 143/69 (!) 159/77  Pulse: 74  73 69  Resp: Temp: 99.4 F (37.4 C)  98.1 F (36.7 C) 97.9 F (36.6 C)  TempSrc: Oral  Oral Oral  SpO2: (!) 88% 90% 94% 95%  Weight:      Height:        Intake/Output Summary (Last 24 hours) at 04/14/2018 1303 Last data filed at 04/14/2018 1000 Gross per 24 hour  Intake 770 ml  Output 675 ml  Net 95 ml   Filed Weights   04/12/18 1952  Weight: 116.6 kg (257 lb)    Exam  General: Well developed, well nourished, NAD, appears stated age  HEENT: NCAT, mucous membranes moist.   Neck: Supple  Cardiovascular: S1 S2 auscultated, no rubs, murmurs or gallops. Regular rate and rhythm.  Respiratory: Clear to auscultation bilaterally with equal chest rise  Abdomen: Soft, nontender, nondistended, + bowel sounds  Extremities: warm dry without cyanosis clubbing or edema  Neuro: AAOx3, chronic right-sided hemiplegia.  Otherwise nonfocal  Skin: Without rashes exudates or nodules  Psych: Pleasant.  Appropriate mood  and affect   Data Reviewed: I have personally reviewed following labs and imaging studies  CBC: Recent Labs  Lab 04/12/18 2122 04/13/18 0501  WBC 8.9 7.9  HGB 15.5 14.3  HCT 45.5 43.3  MCV 88.9 89.8  PLT 164 147*   Basic Metabolic Panel: Recent Labs  Lab  04/12/18 2122 04/13/18 0501  NA 144 145  K 4.4 4.3  CL 108 108  CO2 24 24  GLUCOSE 111* 117*  BUN 21* 21*  CREATININE 1.33* 1.31*  CALCIUM 9.7 9.2   GFR: Estimated Creatinine Clearance: 67.7 mL/min (A) (by C-G formula based on SCr of 1.31 mg/dL (H)). Liver Function Tests: No results for input(s): AST, ALT, ALKPHOS, BILITOT, PROT, ALBUMIN in the last 168 hours. No results for input(s): LIPASE, AMYLASE in the last 168 hours. No results for input(s): AMMONIA in the last 168 hours. Coagulation Profile: Recent Labs  Lab 04/12/18 2122 04/13/18 0501 04/14/18 0413  INR 2.08 2.14 2.32   Cardiac Enzymes: No results for input(s): CKTOTAL, CKMB, CKMBINDEX, TROPONINI in the last 168 hours. BNP (last 3 results) No results for input(s): PROBNP in the last 8760 hours. HbA1C: No results for input(s): HGBA1C in the last 72 hours. CBG: Recent Labs  Lab 04/13/18 1210 04/13/18 1704 04/13/18 2142 04/14/18 0756 04/14/18 1201  GLUCAP 101* 137* 151* 169* 217*   Lipid Profile: No results for input(s): CHOL, HDL, LDLCALC, TRIG, CHOLHDL, LDLDIRECT in the last 72 hours. Thyroid Function Tests: No results for input(s): TSH, T4TOTAL, FREET4, T3FREE, THYROIDAB in the last 72 hours. Anemia Panel: No results for input(s): VITAMINB12, FOLATE, FERRITIN, TIBC, IRON, RETICCTPCT in the last 72 hours. Urine analysis:    Component Value Date/Time   COLORURINE YELLOW 04/12/2018 2127   APPEARANCEUR CLEAR 04/12/2018 2127   LABSPEC 1.015 04/12/2018 2127   PHURINE 5.0 04/12/2018 2127   GLUCOSEU NEGATIVE 04/12/2018 2127   HGBUR SMALL (A) 04/12/2018 2127   BILIRUBINUR NEGATIVE 04/12/2018 2127   KETONESUR NEGATIVE 04/12/2018 2127   PROTEINUR NEGATIVE 04/12/2018 2127   UROBILINOGEN 1.0 04/22/2014 1340   NITRITE NEGATIVE 04/12/2018 2127   LEUKOCYTESUR NEGATIVE 04/12/2018 2127   Sepsis Labs: (procalcitonin:4,lacticidven:4)  )No results found for this or any previous visit (from the past 240  hour(s)).    Radiology Studies: Ct Lumbar Spine Wo Contrast  Result Date: 04/12/2018 CLINICAL DATA:  Worsening low back pain. History of L4 compression fracture. EXAM: CT LUMBAR SPINE WITHOUT CONTRAST TECHNIQUE: Multidetector CT imaging of the lumbar spine was performed without intravenous contrast administration. Multiplanar CT image reconstructions were also generated. COMPARISON:  CT abdomen pelvis 03/24/2018 FINDINGS: Segmentation: 5 lumbar type vertebrae. Alignment: Normal. Vertebrae: Chronic compression deformities of L1 and L4. There has been progression of L4 height loss compared to 03/24/2018. No new compression fracture. Paraspinal and other soft tissues: Atherosclerotic calcification of the abdominal aorta. Disc levels: Mild narrowing of the spinal canal at L3-L4 and L4-L5. Mild-to-moderate bilateral L4-5 foraminal stenosis. Otherwise, the spinal canal is widely patent. IMPRESSION: 1. Mild progression of height loss at L4 compared to the 03/24/2018 study. Unchanged appearance of L1 compression fracture. 2. Mild spinal canal narrowing at L3-4 and L4-5 with mild-to-moderate bilateral L4-5 foraminal stenosis. 3.  Aortic Atherosclerosis (ICD10-I70.0). Electronically Signed   By: Deatra Robinson M.D.   On: 04/12/2018 23:00   Mr Lumbar Spine Wo Contrast  Result Date: 04/13/2018 CLINICAL DATA:  Worsening low back pain. The patient suffered an L4 compression fracture in a fall 03/16/2018. EXAM: MRI LUMBAR SPINE WITHOUT CONTRAST TECHNIQUE:  Multiplanar, multisequence MR imaging of the lumbar spine was performed. No intravenous contrast was administered. COMPARISON:  CT lumbar spine 04/12/2018. CT abdomen and pelvis 03/24/2018 and 05/13/2016. FINDINGS: Segmentation:  Standard. Alignment:  Maintained. Vertebrae: Remote L1 compression fracture is identified as seen on all of the prior examinations. The patient also has a remote L2 compression fracture which is seen on the 05/13/2016 CT. There are acute or  subacute compression fractures of L3 and L4 with marrow edema in both vertebral bodies. Vertebral body height loss at L3 is approximately 40%. There is near vertebra plana deformity of the central aspect of L4. Vertebral body height loss has not progressed at either level since the most recent CT scan. Marrow edema in both vertebral bodies is more extensive in L4. No other fracture is identified. Marrow signal is otherwise unremarkable. Conus medullaris and cauda equina: Conus extends to the L1 level. Conus and cauda equina appear normal. Paraspinal and other soft tissues: Small left renal cyst is noted in seen on prior exams. Disc levels: T11-12 is imaged in the sagittal plane only and negative. T12-L1: No bony retropulsion off L1. No disc bulge or protrusion. The central canal and foramina are open. L1-2: Minimal disc bulge without stenosis. L2-3: Negative. L3-4: Shallow disc bulge. Also seen is bony retropulsion off the posterior margin of L4 resulting in mild to moderate central canal stenosis in conjunction with ligamentum flavum thickening. The foramina are open. L4-5: Minimal disc bulge and mild facet degenerative disease without stenosis. L5-S1: Epidural fat is prominent. Mild facet arthropathy. Minimal disc bulge without stenosis. IMPRESSION: The examination is positive for acute or subacute compression fractures of L3 and L4 with vertebral body height loss at L3 of approximately 40% and near vertebra plana deformity centrally of the central aspect L4, unchanged since the most recent exam. Bony retropulsion off the posterior aspect of L4 causes mild to moderate central canal stenosis. There is no retropulsion at L3. Remote L1 and L2 compression fractures. Mild degenerative disc disease as described above. Electronically Signed   By: Drusilla Kanner M.D.   On: 04/13/2018 15:03   Dg Hips Bilat With Pelvis 2v  Result Date: 04/13/2018 CLINICAL DATA:  Worsening hip pain EXAM: DG HIP (WITH OR WITHOUT PELVIS)  2V BILAT COMPARISON:  None. FINDINGS: Mild spurring in the hip joints bilaterally. No fracture, subluxation or dislocation. SI joints are symmetric and unremarkable. 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 No acute bony abnormality. Electronically Signed   By: Charlett Nose M.D.   On: 04/13/2018 11:47     Scheduled Meds: . amLODipine  10 mg Oral Daily  . ARIPiprazole  2.5 mg Oral Daily  . docusate sodium  100 mg Oral Q M,W,F  . furosemide  20 mg Oral Daily  . hydrALAZINE  25 mg Oral BID  . insulin aspart  0-9 Units Subcutaneous TID WC  . irbesartan  37.5 mg Oral Daily  . isosorbide mononitrate  30 mg Oral Daily  . lidocaine  1 patch Transdermal Q24H  . methocarbamol  500 mg Oral TID  . methylPREDNISolone (SOLU-MEDROL) injection  60 mg Intravenous Q6H  . pioglitazone  7.5 mg Oral Daily  . potassium chloride SA  20 mEq Oral Daily  . sertraline  150 mg Oral Daily  . simvastatin  20 mg Oral q1800  . [START ON 04/16/2018] Vitamin D (Ergocalciferol)  50,000 Units Oral Q Fri   Continuous Infusions:   LOS: 0 days   Time Spent in minutes  45 minutes  Oddie Bottger D.O. on 04/14/2018 at 1:03 PM  Between 7am to 7pm - Pager - [phone]  After 7pm go to [email or web address] - password [email or web address]  And look for the night coverage person covering for me after hours  [facility] Office  [phone]

## 2018-04-14 NOTE — Progress Notes (Signed)
Patient spouse chose [facility].[employer] insurance information initiated this am.  Vivi Barrack, Theresia Majors, MSW Clinical Social Worker  [phone] 04/14/2018  3:17 PM

## 2018-04-14 NOTE — Plan of Care (Signed)
Plan of care discussed with patient 

## 2018-04-14 NOTE — Progress Notes (Signed)
Chief Complaint: Patient was seen in consultation today for  Chief Complaint  Patient presents with  . Back Pain   at the request of Dr. Edsel Petrin  Referring Physician(s): Dr. Edsel Petrin  Supervising Physician: Oley Balm  Patient Status: [facility] - In-pt  History of Present Illness: Dakota Hall is a 67 y.o. male with multiple medical problems including hx of CVA with residual right sided hemiplegia. He is still somewhat independent and able to do most ADLs. A couple weeks ago he was trying to get up from the bed and felt a sudden sharp pain in his lower back. He was brought in and found to have an acute L4 compression fracture with about 60% height loss. The decision was made to treat conservatively. Over the past couple weeks, his pain has worsened in his lower back, reaching as high as 8-9/10. Denies pain into LE. Denies bowel or bladder control issues. He has now been admitted due to intractable back pain. Further workup including an MRI now finds an acute L3 fracture as well as the L4 fracture,m which has now progresses even further since prior imaging.  IR is asked to eval for kyphoplasty/vertebroplasty.  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 This has been held since admission.  He otherwise feels well, no recent fevers, chills, illness or infectious issues.   Past Medical History:  Diagnosis Date  . 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 Back spasm   . Chronic kidney disease (CKD), stage III (moderate) (HCC)   . Depression with anxiety   . Diabetes mellitus   . Dysarthria   . Dyslipidemia   . Hypertension   . Obesity   . OSA on CPAP   . Pulmonary emboli (HCC) 02/2014  . Stasis edema of both lower extremities   . Stroke [facility]) July 2013   left paramedian pontine, incidental right parietal subcortical. both d/t small vessel disease  . Stroke [facility])  March 2015   left basal ganglia secondary to small vessel disease    Past Surgical History:  Procedure Laterality Date  . ANAL FISSURE REPAIR    . TONSILLECTOMY      Allergies: Penicillins  Medications:  Current Facility-Administered Medications:  .  acetaminophen (TYLENOL) tablet 650 mg, 650 mg, Oral, Q6H PRN, 650 mg at 04/13/18 0758 **OR** acetaminophen (TYLENOL) suppository 650 mg, 650 mg, Rectal, Q6H PRN, Eduard Clos, MD .  amLODipine (NORVASC) tablet 10 mg, 10 mg, Oral, Daily, Eduard Clos, MD, 10 mg at 04/14/18 1022 .  ARIPiprazole (ABILIFY) tablet 2.5 mg, 2.5 mg, Oral, Daily, Eduard Clos, MD, 2.5 mg at 04/14/18 1023 .  docusate sodium (COLACE) capsule 100 mg, 100 mg, Oral, Q M,W,F, Toniann Fail, Arshad N, MD, 100 mg at 04/14/18 1024 .  furosemide (LASIX) tablet 20 mg, 20 mg, Oral, Daily, Eduard Clos, MD, 20 mg at 04/14/18 1025 .  hydrALAZINE (APRESOLINE) tablet 25 mg, 25 mg, Oral, BID, Eduard Clos, MD, 25 mg at 04/14/18 1024 .  insulin aspart (novoLOG) injection 0-9 Units, 0-9 Units, Subcutaneous, TID WC, Eduard Clos, MD, 3 Units at 04/14/18 1226 .  irbesartan (AVAPRO) tablet 37.5 mg, 37.5 mg, Oral, Daily, Eduard Clos, MD, 37.5 mg at 04/14/18 1024 .  isosorbide mononitrate (IMDUR) 24 hr tablet 30 mg, 30 mg, Oral, Daily, Eduard Clos, MD, 30 mg at 04/14/18 1023 .  lidocaine (LIDODERM)  5 % 1 patch, 1 patch, Transdermal, Q24H, Eduard Clos, MD, 1 patch at 04/13/18 2159 .  methocarbamol (ROBAXIN) tablet 500 mg, 500 mg, Oral, TID, Mahala Menghini, Jai-Gurmukh, MD, 500 mg at 04/14/18 1024 .  [START ON 04/15/2018] methylPREDNISolone sodium succinate (SOLU-MEDROL) 125 mg/2 mL injection 60 mg, 60 mg, Intravenous, Q12H, Mikhail, Maryann, DO .  ondansetron (ZOFRAN) tablet 4 mg, 4 mg, Oral, Q6H PRN **OR** ondansetron (ZOFRAN) injection 4 mg, 4 mg, Intravenous, Q6H PRN, Midge Minium N, MD .  pioglitazone (ACTOS) tablet  7.5 mg, 7.5 mg, Oral, Daily, Midge Minium N, MD, 7.5 mg at 04/14/18 1022 .  potassium chloride SA (K-DUR,KLOR-CON) CR tablet 20 mEq, 20 mEq, Oral, Daily, Eduard Clos, MD, 20 mEq at 04/14/18 1024 .  sertraline (ZOLOFT) tablet 150 mg, 150 mg, Oral, Daily, Eduard Clos, MD, 150 mg at 04/14/18 1023 .  simvastatin (ZOCOR) tablet 20 mg, 20 mg, Oral, q1800, Eduard Clos, MD, 20 mg at 04/13/18 1739 .  traMADol (ULTRAM) tablet 100 mg, 100 mg, Oral, Q6H PRN, Rhetta Mura, MD, 100 mg at 04/13/18 1357 .  [START ON 04/16/2018] Vitamin D (Ergocalciferol) (DRISDOL) capsule 50,000 Units, 50,000 Units, Oral, Q Leeanne Deed, MD    Family History  Problem Relation Age of Onset  . Cancer Mother   . Heart disease Father     Social History   Socioeconomic History  . Marital status: Married    Spouse name: [name]   . Number of children: 2  . Years of education: College   . Highest education level: Not on file  Occupational History  . Occupation: disabled    Employer: [employer]  . Occupation: PSR    Employer: [employer]  Social Needs  . Financial resource strain: Not on file  . Food insecurity:    Worry: Not on file    Inability: Not on file  . Transportation needs:    Medical: Not on file    Non-medical: Not on file  Tobacco Use  . Smoking status: Never Smoker  . Smokeless tobacco: Never Used  Substance and Sexual Activity  . Alcohol use: No    Alcohol/week: 0.0 oz  . Drug use: No  . Sexual activity: Not on file  Lifestyle  . Physical activity:    Days per week: Not on file    Minutes per session: Not on file  . Stress: Not on file  Relationships  . Social connections:    Talks on phone: Not on file    Gets together: Not on file    Attends religious service: Not on file    Active member of club or organization: Not on file    Attends meetings of clubs or organizations: Not on file    Relationship status: Not on file    Other Topics Concern  . Not on file  Social History Narrative   Patient lives at home with wife [name].    Patient has 2 children.    Patient is currently working.    Patient has a Degree.      Review of Systems: A 12 point ROS discussed and pertinent positives are indicated in the HPI above.  All other systems are negative.  Review of Systems  Vital Signs: BP (!) 159/77 (BP Location: Left Arm)   Pulse 69   Temp 97.9 F (36.6 C) (Oral)   Resp 20   Ht  (1.702 m)   Wt 257 lb (116.6 kg)  SpO2 95%   BMI 40.25 kg/m   Physical Exam  Constitutional: He is oriented to person, place, and time. He appears well-developed. No distress.  HENT:  Head: Normocephalic.  Mouth/Throat: Oropharynx is clear and moist.  Neck: Normal range of motion. No JVD present. No tracheal deviation present.  Cardiovascular: Normal rate, regular rhythm and normal heart sounds.  Pulmonary/Chest: Effort normal and breath sounds normal. No respiratory distress.  Abdominal: Soft. He exhibits no mass. There is no tenderness. There is no guarding.  Musculoskeletal:  Tender in the mid lumbar region, no palpable defects.  Neurological: He is alert and oriented to person, place, and time.  Skin: Skin is warm and dry. No rash noted. No erythema.  Psychiatric: He has a normal mood and affect.    Imaging: Ct Lumbar Spine Wo Contrast  Result Date: 04/12/2018 CLINICAL DATA:  Worsening low back pain. History of L4 compression fracture. EXAM: CT LUMBAR SPINE WITHOUT CONTRAST TECHNIQUE: Multidetector CT imaging of the lumbar spine was performed without intravenous contrast administration. Multiplanar CT image reconstructions were also generated. COMPARISON:  CT abdomen pelvis 03/24/2018 FINDINGS: Segmentation: 5 lumbar type vertebrae. Alignment: Normal. Vertebrae: Chronic compression deformities of L1 and L4. There has been progression of L4 height loss compared to 03/24/2018. No new compression fracture. Paraspinal  and other soft tissues: Atherosclerotic calcification of the abdominal aorta. Disc levels: Mild narrowing of the spinal canal at L3-L4 and L4-L5. Mild-to-moderate bilateral L4-5 foraminal stenosis. Otherwise, the spinal canal is widely patent. IMPRESSION: 1. Mild progression of height loss at L4 compared to the 03/24/2018 study. Unchanged appearance of L1 compression fracture. 2. Mild spinal canal narrowing at L3-4 and L4-5 with mild-to-moderate bilateral L4-5 foraminal stenosis. 3.  Aortic Atherosclerosis (ICD10-I70.0). Electronically Signed   By: Deatra Robinson M.D.   On: 04/12/2018 23:00   Mr Lumbar Spine Wo Contrast  Result Date: 04/13/2018 CLINICAL DATA:  Worsening low back pain. The patient suffered an L4 compression fracture in a fall 03/16/2018. EXAM: MRI LUMBAR SPINE WITHOUT CONTRAST TECHNIQUE: Multiplanar, multisequence MR imaging of the lumbar spine was performed. No intravenous contrast was administered. COMPARISON:  CT lumbar spine 04/12/2018. CT abdomen and pelvis 03/24/2018 and 05/13/2016. FINDINGS: Segmentation:  Standard. Alignment:  Maintained. Vertebrae: Remote L1 compression fracture is identified as seen on all of the prior examinations. The patient also has a remote L2 compression fracture which is seen on the 05/13/2016 CT. There are acute or subacute compression fractures of L3 and L4 with marrow edema in both vertebral bodies. Vertebral body height loss at L3 is approximately 40%. There is near vertebra plana deformity of the central aspect of L4. Vertebral body height loss has not progressed at either level since the most recent CT scan. Marrow edema in both vertebral bodies is more extensive in L4. No other fracture is identified. Marrow signal is otherwise unremarkable. Conus medullaris and cauda equina: Conus extends to the L1 level. Conus and cauda equina appear normal. Paraspinal and other soft tissues: Small left renal cyst is noted in seen on prior exams. Disc levels: T11-12 is  imaged in the sagittal plane only and negative. T12-L1: No bony retropulsion off L1. No disc bulge or protrusion. The central canal and foramina are open. L1-2: Minimal disc bulge without stenosis. L2-3: Negative. L3-4: Shallow disc bulge. Also seen is bony retropulsion off the posterior margin of L4 resulting in mild to moderate central canal stenosis in conjunction with ligamentum flavum thickening. The foramina are open. L4-5: Minimal disc bulge and mild  facet degenerative disease without stenosis. L5-S1: Epidural fat is prominent. Mild facet arthropathy. Minimal disc bulge without stenosis. IMPRESSION: The examination is positive for acute or subacute compression fractures of L3 and L4 with vertebral body height loss at L3 of approximately 40% and near vertebra plana deformity centrally of the central aspect L4, unchanged since the most recent exam. Bony retropulsion off the posterior aspect of L4 causes mild to moderate central canal stenosis. There is no retropulsion at L3. Remote L1 and L2 compression fractures. Mild degenerative disc disease as described above. Electronically Signed   By: Drusilla Kanner M.D.   On: 04/13/2018 15:03   Ct Abdomen Pelvis W Contrast  Result Date: 03/24/2018 CLINICAL DATA:  Initial evaluation for low back pain radiating down both legs. History of minor trauma. EXAM: CT ABDOMEN AND PELVIS WITH CONTRAST TECHNIQUE: Multidetector CT imaging of the abdomen and pelvis was performed using the standard protocol following bolus administration of intravenous contrast. CONTRAST:  ISOVUE-300 IOPAMIDOL (ISOVUE-300) INJECTION 61% COMPARISON:  Prior CT from 05/13/2016. FINDINGS: Lower chest: Mild scattered bibasilar atelectatic changes. Small nodular density along the right minor fissure felt to be most consistent with a intra pulmonic lymph node. Visualized lungs are otherwise clear. Hepatobiliary: Liver demonstrates a normal contrast enhanced appearance. Gallbladder within normal  limits. No biliary dilatation. Pancreas: Fatty infiltration the pancreas noted. Pancreas otherwise unremarkable without acute inflammatory changes. Spleen: Spleen intact and within normal limits. Adrenals/Urinary Tract: Adrenal glands are normal. Kidneys equal in size with symmetric enhancement. Kidneys demonstrated lobulated contour bilaterally. Few scattered subcentimeter hypodensities too small the characterize, but statistically likely reflects small cyst. No nephrolithiasis, hydronephrosis, or focal enhancing renal mass. No hydroureter. Partially distended bladder within normal limits. Stomach/Bowel: Stomach within normal limits. Punctate calcification at the GE junction noted, of doubtful significance. No evidence for bowel obstruction. Appendix is surgically absent. No acute inflammatory changes seen about the bowels. Vascular/Lymphatic: Normal intravascular enhancement seen throughout the intra-abdominal aorta. No aneurysm. Moderate aorto bi-iliac atherosclerotic disease. Mesenteric vessels are patent proximally. Mildly prominent porta hepatis lymph nodes measure up to 11 mm. 15 mm right inguinal node measures at the upper limits of normal. No other adenopathy within the abdomen and pelvis. Reproductive: Prostate within normal limits. Other: No free intraperitoneal air. No free fluid. No mesenteric or retroperitoneal hematoma. Musculoskeletal: Visualized external soft tissues demonstrate no acute abnormality. Chronic compression deformities involving the L1 and L2 vertebral bodies are stable from previous. There is a new compression deformity involving the L4 vertebral body, which appears at least partially acute at the inferior endplate. Associated height loss of up to approximately 60% with 5 mm bony retropulsion. IMPRESSION: 1. Probable acute compression fracture involving the L4 vertebral body with up to 60% height loss and 5 mm bony retropulsion. Correlation with physical exam for possible pain at this  location recommended. 2. No other acute traumatic injury within the abdomen and pelvis. Electronically Signed   By: Rise Mu M.D.   On: 03/24/2018 05:02   Dg Hips Bilat With Pelvis 2v  Result Date: 04/13/2018 CLINICAL DATA:  Worsening hip pain EXAM: DG HIP (WITH OR WITHOUT PELVIS) 2V BILAT COMPARISON:  None. FINDINGS: Mild spurring in the hip joints bilaterally. No fracture, subluxation or dislocation. SI joints are symmetric and unremarkable. 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 No acute bony abnormality. Electronically Signed   By: Charlett Nose M.D.   On: 04/13/2018 11:47    Labs:  CBC: Recent Labs    03/24/18 0210  03/25/18 0549 04/12/18 2122 04/13/18 0501  WBC 7.9 8.0 8.9 7.9  HGB 16.4 14.2 15.5 14.3  HCT 46.4 41.5 45.5 43.3  PLT 177 166 164 147*    COAGS: Recent Labs    03/26/18 0605 04/12/18 2122 04/13/18 0501 04/14/18 0413  INR 3.04 2.08 2.14 2.32    BMP: Recent Labs    03/24/18 0210 03/25/18 0549 04/12/18 2122 04/13/18 0501  NA 140 139 144 145  K 4.1 4.1 4.4 4.3  CL 108 109 108 108  CO2 22 21* 24 24  GLUCOSE 108* 143* 111* 117*  BUN 22* 23* 21* 21*  CALCIUM 9.4 8.9 9.7 9.2  CREATININE 1.21 1.13 1.33* 1.31*  GFRNONAA >60 >60 54* 55*  GFRAA >60 >60 >60 >60    LIVER FUNCTION TESTS: Recent Labs    03/25/18 0549  BILITOT 0.7  AST 18  ALT 20  ALKPHOS 85  PROT 7.3  ALBUMIN 3.5    TUMOR MARKERS: No results for input(s): AFPTM, CEA, CA199, CHROMGRNA in the last 8760 hours.  Assessment and Plan: Severe back pain secondary to acute L3 and L4 compression fractures with height loss progression over the past few weeks. Imaging reviewed with Dr. Deanne Coffer. Pt is a good candidate for Kyphoplasty of both L3 and L4 levels. Have discussed with the pt and the wife the indications for KP procedure to improve pain and reduce recovery time, with the ultimate goal to return the pt back to his independent pre-hospitalization  status. Pt and wife are agreeable to procedure.  Coumadin has been held, last INR was 2.3  Expect INR to drop over next couple days. Await insurance authorization, request submitted. Tentatively plan for Friday 5/31 as procedure date with Dr. Deanne Coffer.  Risks and benefits of kyphoplasty were discussed with the patient and his wife including, but not limited to education regarding the natural healing process of compression fractures without intervention, bleeding, infection, cement migration which may cause spinal cord damage, paralysis, pulmonary embolism or even death.  This interventional procedure involves the use of X-rays and because of the nature of the planned procedure, it is possible that we will have prolonged use of X-ray fluoroscopy.  Potential radiation risks to you include (but are not limited to) the following: - A slightly elevated risk for cancer  several years later in life. This risk is typically less than 0.5% percent. This risk is low in comparison to the normal incidence of human cancer, which is 33% for women and 50% for men according to the [employer] Society. - Radiation induced injury can include skin redness, resembling a rash, tissue breakdown / ulcers and hair loss (which can be temporary or permanent).   The likelihood of either of these occurring depends on the difficulty of the procedure and whether you are sensitive to radiation due to previous procedures, disease, or genetic conditions.   IF your procedure requires a prolonged use of radiation, you will be notified and given written instructions for further action.  It is your responsibility to monitor the irradiated area for the 2 weeks following the procedure and to notify your physician if you are concerned that you have suffered a radiation induced injury.    All of the patient's questions were answered, patient is agreeable to proceed.  Consent signed and in chart.   Thank you for this interesting  consult.  I greatly enjoyed meeting MACE WEINBERG and look forward to participating in their care.  A copy of this report was  sent to the requesting provider on this date.  Electronically Signed: Brayton El, PA-C 04/14/2018, 2:52 PM   I spent a total of 40 minutes in face to face in clinical consultation, greater than 50% of which was counseling/coordinating care for acute L3 and L4 compression fractures

## 2018-04-15 DIAGNOSIS — Z8673 Personal history of transient ischemic attack (TIA), and cerebral infarction without residual deficits: Secondary | ICD-10-CM

## 2018-04-15 DIAGNOSIS — S32040D Wedge compression fracture of fourth lumbar vertebra, subsequent encounter for fracture with routine healing: Secondary | ICD-10-CM

## 2018-04-15 LAB — PROTIME-INR
INR: 2.44
INR: 1.87
PROTHROMBIN TIME: 26.3 s — AB (ref 11.4–15.2)
PROTHROMBIN TIME: 21.4 s — AB (ref 11.4–15.2)

## 2018-04-15 LAB — CBC
HCT: 42.3 % (ref 39.0–52.0)
Hemoglobin: 14.6 g/dL (ref 13.0–17.0)
MCH: 30 pg (ref 26.0–34.0)
MCHC: 34.5 g/dL (ref 30.0–36.0)
MCV: 86.9 fL (ref 78.0–100.0)
PLATELETS: 184 10*3/uL (ref 150–400)
RBC: 4.87 MIL/uL (ref 4.22–5.81)
RDW: 12.6 % (ref 11.5–15.5)
WBC: 10.4 10*3/uL (ref 4.0–10.5)

## 2018-04-15 LAB — GLUCOSE, CAPILLARY
GLUCOSE-CAPILLARY: 167 mg/dL — AB (ref 65–99)
GLUCOSE-CAPILLARY: 164 mg/dL — AB (ref 65–99)
GLUCOSE-CAPILLARY: 183 mg/dL — AB (ref 65–99)
Glucose-Capillary: 148 mg/dL — ABNORMAL HIGH (ref 65–99)
Glucose-Capillary: 166 mg/dL — ABNORMAL HIGH (ref 65–99)
Glucose-Capillary: 198 mg/dL — ABNORMAL HIGH (ref 65–99)

## 2018-04-15 MED ORDER — NYSTATIN 100000 UNIT/GM EX POWD
Freq: Two times a day (BID) | CUTANEOUS | Status: DC
  Administered 2018-04-15 – 2018-04-18 (×6): via TOPICAL
  Filled 2018-04-15: qty 15

## 2018-04-15 MED ORDER — PHYTONADIONE 5 MG PO TABS
2.5000 mg | ORAL_TABLET | Freq: Once | ORAL | Status: AC
  Administered 2018-04-15: 2.5 mg via ORAL
  Filled 2018-04-15: qty 1

## 2018-04-15 MED ORDER — HEPARIN (PORCINE) IN NACL 100-0.45 UNIT/ML-% IJ SOLN
1500.0000 [IU]/h | INTRAMUSCULAR | Status: DC
  Administered 2018-04-15: 1500 [IU]/h via INTRAVENOUS
  Filled 2018-04-15 (×2): qty 250

## 2018-04-15 NOTE — Progress Notes (Signed)
Patient ID: Dakota Hall, male   DOB: 1951/01/18, 67 y.o.   MRN: [id number] Insurance authorization still pending for pt's KP; PT/INR cont to elevate, currently 26.3/2.44. Cannot safely perform procedure unless INR closer to 1.6.

## 2018-04-15 NOTE — Progress Notes (Signed)
ANTICOAGULATION CONSULT NOTE - Follow Up  Pharmacy Consult for Warfarin - holding for possible surgery, to start IV UFH when INR < 2 Indication: pulmonary embolus  Allergies  Allergen Reactions  . Penicillins Hives and Other (See Comments)    Has patient had a PCN reaction causing immediate rash, facial/tongue/throat swelling, SOB or lightheadedness with hypotension: no Has patient had a PCN reaction causing severe rash involving mucus membranes or skin necrosis: no Has patient had a PCN reaction that required hospitalization: no Has patient had a PCN reaction occurring within the last 10 years: no If all of the above answers are "NO", then may proceed with Cephalosporin use.    Patient Measurements: Height:  (170.2 cm) Weight: 257 lb (116.6 kg) IBW/kg (Calculated) : 66.1  Vital Signs: Temp: 98.2 F (36.8 C) (05/30 0619) Temp Source: Oral (05/30 0619) BP: 168/82 (05/30 0619) Pulse Rate: 61 (05/30 0619)  Labs: Recent Labs    04/12/18 2122 04/13/18 0501 04/14/18 0413 04/15/18 0450  HGB 15.5 14.3  --  14.6  HCT 45.5 43.3  --  42.3  PLT 164 147*  --  184  LABPROT 23.3* 23.7* 25.2* 26.3*  INR 2.08 2.14 2.32 2.44  CREATININE 1.33* 1.31*  --   --    Estimated Creatinine Clearance: 67.7 mL/min (A) (by C-G formula based on SCr of 1.31 mg/dL (H)).  Medical History: Past Medical History:  Diagnosis Date  . 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 Back spasm   . Chronic kidney disease (CKD), stage III (moderate) (HCC)   . Depression with anxiety   . Diabetes mellitus   . Dysarthria   . Dyslipidemia   . Hypertension   . Obesity   . OSA on CPAP   . Pulmonary emboli (HCC) 02/2014  . Stasis edema of both lower extremities   . Stroke [facility]) July 2013   left paramedian pontine, incidental right parietal subcortical. both d/t small vessel disease  . Stroke [facility]) March 2015   left basal ganglia secondary to small vessel disease    Medications:   Scheduled:  . amLODipine  10 mg Oral Daily  . ARIPiprazole  2.5 mg Oral Daily  . docusate sodium  100 mg Oral Q M,W,F  . furosemide  20 mg Oral Daily  . hydrALAZINE  25 mg Oral BID  . insulin aspart  0-9 Units Subcutaneous TID WC  . irbesartan  37.5 mg Oral Daily  . isosorbide mononitrate  30 mg Oral Daily  . lidocaine  1 patch Transdermal Q24H  . methocarbamol  500 mg Oral TID  . methylPREDNISolone (SOLU-MEDROL) injection  60 mg Intravenous Q12H  . pioglitazone  7.5 mg Oral Daily  . potassium chloride SA  20 mEq Oral Daily  . sertraline  150 mg Oral Daily  . simvastatin  20 mg Oral q1800  . [START ON 04/16/2018] Vitamin D (Ergocalciferol)  50,000 Units Oral Q Fri   Infusions:   Assessment: 66 yoM on chronic warfarin for PE.  HD 5 mg M/W and 2.5 mg other days. LD 5/27 2200. INR=2.08 on admission.   Today, 04/15/18  INR still > 2 (2.44) - orders to start IV heparin once INR < 2  Hgb stable  No reported bleeding  Goal of Therapy:  INR 2-3    Plan:  1) NO IV Heparin today 2) Daily INR 3) Await plans for possible kyphoplasty  Otho Bellows PharmD Pager [phone] 04/15/2018, 11:47 AM

## 2018-04-15 NOTE — Progress Notes (Signed)
PROGRESS NOTE    Dakota Hall  ZOX:[id number] DOB: August 23, 1951 DOA: 04/12/2018 PCP: Lorenda Ishihara, MD   Brief Narrative:  HPI On 5/28/219 by Dr. Midge Minium Dakota Hall is a 67 y.o. male with history of diabetes mellitus type 2, stroke with right-sided hemiplegia, pulmonary embolism, hypertension who was admitted and discharged on Mar 26, 2018 for L5 compression fracture presents to the ER back with worsening pain.  Patient states his pain worsened last few days.  Pain is worsened on trying to ambulate.  Denies any incontinence of urine or bowel.  Interim history Found to have L4 compression fracture.  Interventional radiology consulted for possible kyphoplasty.  Assessment & Plan   Lower back pain with L4 compression fracture -CT lumbar spine showed mild progression of height loss at L4 compared to 03/24/2018.  Unchanged appearance of L1 compression fracture.  Mild spinal canal narrowing L3-4, L4-5 with mild to moderate bilateral L4-5 foraminal stenosis. -MRI lumbar spine showed acute or subacute compression fractures of L3 and L4 with vertebral body height loss at L3 approximately 40% -Interventional radiology consulted and appreciated for possible kyphoplasty, currently pending insurance authorization and improvement in INR -PT consulted recommended SNF -Social work consult -Continue pain control.  Patient started on Solu-Medrol to aid with pain (will wean) -INR 2.44 today, will give one dose of PO vitamin K and repeat INR later today. Will likely need additional dose of Vit K as INR needs to be close to 1.6 for procedure.  History of pulmonary embolism -Patient on Coumadin, received 1 dose during hospitalization -INR currently 2.44  Diabetes mellitus, type II -Continue Actos -Given that patient was placed on IV Solu-Medrol, will place on insulin sliding scale with CBG monitoring  Essential hypertension -Continue amlodipine, hydralazine, Avapro, imdur,  lasix  History of stroke -With right-sided hemi-plegia -As above, patient on Coumadin which is currently held -Continue statin  Bipolar disorder -Stable, continue Abilify and Zoloft  CKD, stage III -Stable, continue to monitor BMP  DVT Prophylaxis  Therapeutic INR  Code Status: Full  Family Communication: None at bedside  Disposition Plan: Admitted. Continue pain control, with narcotics and IV steroids. Pending insurance auth for kyphoplasty as well as improvement in INR.  Consultants Interventional radiology  Procedures  None  Antibiotics   Anti-infectives (From admission, onward)   None      Subjective:   Dakota Hall seen and examined today.  Continues to have pain in his back especially with movement.  Unable to turn in the bed without experiencing pain.  Denies current chest pain, shortness of breath, abdominal pain, nausea or vomiting, diarrhea or constipation.  Objective:   Vitals:   04/14/18 1458 04/14/18 2024 04/14/18 2047 04/15/18 0619  BP: (!) 150/78  (!) 152/75 (!) 168/82  Pulse: 79 72 70 61  Resp: Temp: (!) 97.4 F (36.3 C)  98.3 F (36.8 C) 98.2 F (36.8 C)  TempSrc: Oral  Oral Oral  SpO2: 95% 96% 95% 95%  Weight:      Height:        Intake/Output Summary (Last 24 hours) at 04/15/2018 1041 Last data filed at 04/15/2018 1000 Gross per 24 hour  Intake 1110 ml  Output 2485 ml  Net -1375 ml   Filed Weights   04/12/18 1952  Weight: 116.6 kg (257 lb)   Exam  General: Well developed, well nourished, NAD, appears stated age  HEENT: NCAT, mucous membranes moist.   Neck: Supple  Cardiovascular: S1 S2  auscultated, no rubs, murmurs or gallops. Regular rate and rhythm.  Respiratory: Mild expiratory wheezing, diminished breath sounds  Abdomen: Soft, nontender, nondistended, + bowel sounds  Extremities: warm dry without cyanosis clubbing or edema  Neuro: AAOx3, chronic right-sided hemiplegia, otherwise nonfocal  Psych:  appropriate mood and affect  Data Reviewed: I have personally reviewed following labs and imaging studies  CBC: Recent Labs  Lab 04/12/18 2122 04/13/18 0501 04/15/18 0450  WBC 8.9 7.9 10.4  HGB 15.5 14.3 14.6  HCT 45.5 43.3 42.3  MCV 88.9 89.8 86.9  PLT 164 147* 184   Basic Metabolic Panel: Recent Labs  Lab 04/12/18 2122 04/13/18 0501  NA 144 145  K 4.4 4.3  CL 108 108  CO2 24 24  GLUCOSE 111* 117*  BUN 21* 21*  CREATININE 1.33* 1.31*  CALCIUM 9.7 9.2   GFR: Estimated Creatinine Clearance: 67.7 mL/min (A) (by C-G formula based on SCr of 1.31 mg/dL (H)). Liver Function Tests: No results for input(s): AST, ALT, ALKPHOS, BILITOT, PROT, ALBUMIN in the last 168 hours. No results for input(s): LIPASE, AMYLASE in the last 168 hours. No results for input(s): AMMONIA in the last 168 hours. Coagulation Profile: Recent Labs  Lab 04/12/18 2122 04/13/18 0501 04/14/18 0413 04/15/18 0450  INR 2.08 2.14 2.32 2.44   Cardiac Enzymes: No results for input(s): CKTOTAL, CKMB, CKMBINDEX, TROPONINI in the last 168 hours. BNP (last 3 results) No results for input(s): PROBNP in the last 8760 hours. HbA1C: No results for input(s): HGBA1C in the last 72 hours. CBG: Recent Labs  Lab 04/14/18 1729 04/14/18 1954 04/15/18 0004 04/15/18 0407 04/15/18 0736  GLUCAP 182* 189* 166* 167* 164*   Lipid Profile: No results for input(s): CHOL, HDL, LDLCALC, TRIG, CHOLHDL, LDLDIRECT in the last 72 hours. Thyroid Function Tests: No results for input(s): TSH, T4TOTAL, FREET4, T3FREE, THYROIDAB in the last 72 hours. Anemia Panel: No results for input(s): VITAMINB12, FOLATE, FERRITIN, TIBC, IRON, RETICCTPCT in the last 72 hours. Urine analysis:    Component Value Date/Time   COLORURINE YELLOW 04/12/2018 2127   APPEARANCEUR CLEAR 04/12/2018 2127   LABSPEC 1.015 04/12/2018 2127   PHURINE 5.0 04/12/2018 2127   GLUCOSEU NEGATIVE 04/12/2018 2127   HGBUR SMALL (A) 04/12/2018 2127    BILIRUBINUR NEGATIVE 04/12/2018 2127   KETONESUR NEGATIVE 04/12/2018 2127   PROTEINUR NEGATIVE 04/12/2018 2127   UROBILINOGEN 1.0 04/22/2014 1340   NITRITE NEGATIVE 04/12/2018 2127   LEUKOCYTESUR NEGATIVE 04/12/2018 2127   Sepsis Labs: (procalcitonin:4,lacticidven:4)  )No results found for this or any previous visit (from the past 240 hour(s)).    Radiology Studies: Mr Lumbar Spine Wo Contrast  Result Date: 04/13/2018 CLINICAL DATA:  Worsening low back pain. The patient suffered an L4 compression fracture in a fall 03/16/2018. EXAM: MRI LUMBAR SPINE WITHOUT CONTRAST TECHNIQUE: Multiplanar, multisequence MR imaging of the lumbar spine was performed. No intravenous contrast was administered. COMPARISON:  CT lumbar spine 04/12/2018. CT abdomen and pelvis 03/24/2018 and 05/13/2016. FINDINGS: Segmentation:  Standard. Alignment:  Maintained. Vertebrae: Remote L1 compression fracture is identified as seen on all of the prior examinations. The patient also has a remote L2 compression fracture which is seen on the 05/13/2016 CT. There are acute or subacute compression fractures of L3 and L4 with marrow edema in both vertebral bodies. Vertebral body height loss at L3 is approximately 40%. There is near vertebra plana deformity of the central aspect of L4. Vertebral body height loss has not progressed at either level since the most recent CT  scan. Marrow edema in both vertebral bodies is more extensive in L4. No other fracture is identified. Marrow signal is otherwise unremarkable. Conus medullaris and cauda equina: Conus extends to the L1 level. Conus and cauda equina appear normal. Paraspinal and other soft tissues: Small left renal cyst is noted in seen on prior exams. Disc levels: T11-12 is imaged in the sagittal plane only and negative. T12-L1: No bony retropulsion off L1. No disc bulge or protrusion. The central canal and foramina are open. L1-2: Minimal disc bulge without stenosis. L2-3:  Negative. L3-4: Shallow disc bulge. Also seen is bony retropulsion off the posterior margin of L4 resulting in mild to moderate central canal stenosis in conjunction with ligamentum flavum thickening. The foramina are open. L4-5: Minimal disc bulge and mild facet degenerative disease without stenosis. L5-S1: Epidural fat is prominent. Mild facet arthropathy. Minimal disc bulge without stenosis. IMPRESSION: The examination is positive for acute or subacute compression fractures of L3 and L4 with vertebral body height loss at L3 of approximately 40% and near vertebra plana deformity centrally of the central aspect L4, unchanged since the most recent exam. Bony retropulsion off the posterior aspect of L4 causes mild to moderate central canal stenosis. There is no retropulsion at L3. Remote L1 and L2 compression fractures. Mild degenerative disc disease as described above. Electronically Signed   By: Drusilla Kanner M.D.   On: 04/13/2018 15:03   Dg Hips Bilat With Pelvis 2v  Result Date: 04/13/2018 CLINICAL DATA:  Worsening hip pain EXAM: DG HIP (WITH OR WITHOUT PELVIS) 2V BILAT COMPARISON:  None. FINDINGS: Mild spurring in the hip joints bilaterally. No fracture, subluxation or dislocation. SI joints are symmetric and unremarkable. 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 No acute bony abnormality. Electronically Signed   By: Charlett Nose M.D.   On: 04/13/2018 11:47     Scheduled Meds: . amLODipine  10 mg Oral Daily  . ARIPiprazole  2.5 mg Oral Daily  . docusate sodium  100 mg Oral Q M,W,F  . furosemide  20 mg Oral Daily  . hydrALAZINE  25 mg Oral BID  . insulin aspart  0-9 Units Subcutaneous TID WC  . irbesartan  37.5 mg Oral Daily  . isosorbide mononitrate  30 mg Oral Daily  . lidocaine  1 patch Transdermal Q24H  . methocarbamol  500 mg Oral TID  . methylPREDNISolone (SOLU-MEDROL) injection  60 mg Intravenous Q12H  . pioglitazone  7.5 mg Oral Daily  . potassium chloride SA   20 mEq Oral Daily  . sertraline  150 mg Oral Daily  . simvastatin  20 mg Oral q1800  . [START ON 04/16/2018] Vitamin D (Ergocalciferol)  50,000 Units Oral Q Fri   Continuous Infusions:   LOS: 1 day   Time Spent in minutes   45 minutes  Kelise Kuch D.O. on 04/15/2018 at 10:41 AM  Between 7am to 7pm - Pager - [phone]  After 7pm go to [email or web address] - password [email or web address]  And look for the night coverage person covering for me after hours  [facility] Office  [phone]

## 2018-04-15 NOTE — Progress Notes (Signed)
ANTICOAGULATION CONSULT NOTE - Follow Up Consult  Pharmacy Consult for IV Heparin when INR < 2 Indication: pulmonary embolus (holding warfarin for surgery)  Patient Measurements: Height:  (170.2 cm) Weight: 257 lb (116.6 kg) IBW/kg (Calculated) : 66.1 Heparin Dosing Weight: 93 kg  Vital Signs: Temp: 98.2 F (36.8 C) (05/30 1500) Temp Source: Oral (05/30 1500) BP: 158/85 (05/30 1500) Pulse Rate: 69 (05/30 1500)  Labs: Recent Labs    04/12/18 2122 04/13/18 0501 04/14/18 0413 04/15/18 0450 04/15/18 1506  HGB 15.5 14.3  --  14.6  --   HCT 45.5 43.3  --  42.3  --   PLT 164 147*  --  184  --   LABPROT 23.3* 23.7* 25.2* 26.3* 21.4*  INR 2.08 2.14 2.32 2.44 1.87  CREATININE 1.33* 1.31*  --   --   --     Estimated Creatinine Clearance: 67.7 mL/min (A) (by C-G formula based on SCr of 1.31 mg/dL (H)).   Medications:  Infusions:    Assessment: 52 yoM admitted on 5/27 with back pain and history of L4 compression fracture.  PMH is significant for chronic warfarin for history of PE, CVA.  Warfarin was resumed on admission, but is now held for pending kyphoplasty.  Goal INR < 1.6 prior to procedure.  Pharmacy is consulted to dose Heparin IV.  Home warfarin dose 5 mg M/W and 2.5 mg other days. LD 5/27 2200. INR=2.08 on admission.   Today, 04/15/2018: 05:00 INR increased to 2.44 despite no warfarin dose on 5/28.  Last dose given on 5/28.  MD ordered Vit K 2.5 mg PO x1 dose this morning. 15:00 Repeat INR decreased to 1.87    Goal of Therapy:  INR 2-3 Heparin level 0.3-0.7 units/ml Monitor platelets by anticoagulation protocol: Yes   Plan:   Start heparin IV infusion at 1500 units/hr   RN states pt has no IV access, but will start Heparin ASAP.  IV team has already been consulted to replace IV.  Heparin level 6 hours after starting  Daily INR, heparin level, and CBC  Continue to monitor H&H and platelets   Lynann Beaver PharmD, BCPS Pager [phone] 04/15/2018  3:41 PM

## 2018-04-15 NOTE — Progress Notes (Signed)
Physical Therapy Treatment Patient Details Name: Dakota Hall MRN: [id number] DOB: Jul 24, 1951 Today's Date: 04/15/2018    History of Present Illness 67 yo male admitted with weakness, low back pain. Imaging (+) chronic L1, L4 compression fractures. Hx of CVA with R hemiplegia, DM, OSA, CKD    PT Comments    Progressing slowly with mobility. Pt was very motivated to get OOB. Back pain still rated 8/10 with activity. Standing and transfer was effortful for pt. Dyspnea 3/4. He continues to require +2 assist for mobility. Per pt, plan is for possible KP/VP on Friday if medically ready. Continue to recommend SNF.    Follow Up Recommendations  SNF     Equipment Recommendations  None recommended by PT    Recommendations for Other Services       Precautions / Restrictions Precautions Precautions: Fall Precaution Comments: issued a TLSO last admission. Brace not present in room on eval 5/28 Restrictions Weight Bearing Restrictions: No Other Position/Activity Restrictions: be mindful of R foot/LE positioning when WBing.     Mobility  Bed Mobility Overal bed mobility: Needs Assistance Bed Mobility: Rolling;Sidelying to Sit Rolling: Mod assist Sidelying to sit: Mod assist;+2 for physical assistance;+2 for safety/equipment;HOB elevated       General bed mobility comments: Increased time to roll to R (pt uses bedrail).  Assist for trunk and bil LEs to sit EOB. Increased tone in R LE.   Transfers Overall transfer level: Needs assistance Equipment used: Hemi-walker Transfers: Sit to/from [employer] Sit to Stand: Mod assist;+2 physical assistance;+2 safety/equipment Stand pivot transfers: Mod assist;+2 physical assistance;+2 safety/equipment       General transfer comment: Assist to position R LE/foot and maintain position with use of therapist's foot. Assist to weightshift, rise, and stabilize in standing with use of hemiwalker on L side. After several mintues  standing statically, pt was able to take a few pivotal steps with L foot to transfer to recliner. Skilled cues for safety, technique.   Ambulation/Gait             General Gait Details: NT   Stairs             Wheelchair Mobility    Modified Rankin (Stroke Patients Only)       Balance Overall balance assessment: Needs assistance         Standing balance support: Single extremity supported Standing balance-Leahy Scale: Poor                              Cognition Arousal/Alertness: Awake/alert Behavior During Therapy: WFL for tasks assessed/performed Overall Cognitive Status: Within Functional Limits for tasks assessed                                        Exercises      General Comments        Pertinent Vitals/Pain Pain Assessment: 0-10 Pain Score: 8  Pain Location: back with activity Pain Descriptors / Indicators: Grimacing;Guarding;Sharp;Discomfort Pain Intervention(s): Monitored during session;Repositioned    Home Living                      Prior Function            PT Goals (current goals can now be found in the care plan section) Progress towards PT goals: Progressing toward goals  Frequency    Min 3X/week      PT Plan Current plan remains appropriate    Co-evaluation              AM-PAC PT "6 Clicks" Daily Activity  Outcome Measure  Difficulty turning over in bed (including adjusting bedclothes, sheets and blankets)?: Unable Difficulty moving from lying on back to sitting on the side of the bed? : Unable Difficulty sitting down on and standing up from a chair with arms (e.g., wheelchair, bedside commode, etc,.)?: Unable Help needed moving to and from a bed to chair (including a wheelchair)?: Total Help needed walking in hospital room?: Total Help needed climbing 3-5 steps with a railing? : Total 6 Click Score: 6    End of Session Equipment Utilized During Treatment: Gait  belt Activity Tolerance: Patient limited by fatigue;Patient limited by pain Patient left: in chair;with call bell/phone within reach Nurse Communication: Mobility status(and to try scoot transfer for back to bed) PT Visit Diagnosis: Pain;Hemiplegia and hemiparesis Hemiplegia - Right/Left: Right Hemiplegia - dominant/non-dominant: Dominant Hemiplegia - caused by: Unspecified Pain - part of body: (back)     Time: 5188-4166 PT Time Calculation (min) (ACUTE ONLY): 17 min  Charges:  $Therapeutic Activity: 8-22 mins                    G Codes:          Rebeca Alert, MPT Pager: [phone]

## 2018-04-15 NOTE — Plan of Care (Signed)
Plan of care discussed with patient 

## 2018-04-16 ENCOUNTER — Encounter (HOSPITAL_COMMUNITY): Payer: Self-pay | Admitting: Interventional Radiology

## 2018-04-16 ENCOUNTER — Inpatient Hospital Stay (HOSPITAL_COMMUNITY): Payer: Medicare HMO

## 2018-04-16 DIAGNOSIS — R791 Abnormal coagulation profile: Secondary | ICD-10-CM

## 2018-04-16 HISTORY — PX: IR KYPHO LUMBAR INC FX REDUCE BONE BX UNI/BIL CANNULATION INC/IMAGING: IMG5519

## 2018-04-16 HISTORY — PX: IR KYPHO EA ADDL LEVEL THORACIC OR LUMBAR: IMG5520

## 2018-04-16 LAB — BASIC METABOLIC PANEL
Anion gap: 7 (ref 5–15)
BUN: 32 mg/dL — AB (ref 6–20)
CALCIUM: 8.8 mg/dL — AB (ref 8.9–10.3)
CO2: 27 mmol/L (ref 22–32)
CREATININE: 1.2 mg/dL (ref 0.61–1.24)
Chloride: 106 mmol/L (ref 101–111)
GFR calc Af Amer: >60 mL/min (ref >60)
Glucose, Bld: 161 mg/dL — ABNORMAL HIGH (ref 65–99)
Potassium: 3.8 mmol/L (ref 3.5–5.1)
SODIUM: 140 mmol/L (ref 135–145)

## 2018-04-16 LAB — GLUCOSE, CAPILLARY
GLUCOSE-CAPILLARY: 203 mg/dL — AB (ref 65–99)
Glucose-Capillary: 187 mg/dL — ABNORMAL HIGH (ref 65–99)
Glucose-Capillary: 170 mg/dL — ABNORMAL HIGH (ref 65–99)
Glucose-Capillary: 132 mg/dL — ABNORMAL HIGH (ref 65–99)
Glucose-Capillary: 143 mg/dL — ABNORMAL HIGH (ref 65–99)

## 2018-04-16 LAB — PROTIME-INR
INR: 1.21
INR: 1.53
PROTHROMBIN TIME: 15.2 s (ref 11.4–15.2)
PROTHROMBIN TIME: 18.3 s — AB (ref 11.4–15.2)

## 2018-04-16 LAB — CBC
HCT: 42.5 % (ref 39.0–52.0)
Hemoglobin: 14.7 g/dL (ref 13.0–17.0)
MCH: 29.9 pg (ref 26.0–34.0)
MCHC: 34.6 g/dL (ref 30.0–36.0)
MCV: 86.6 fL (ref 78.0–100.0)
Platelets: 209 10*3/uL (ref 150–400)
RBC: 4.91 MIL/uL (ref 4.22–5.81)
RDW: 12.7 % (ref 11.5–15.5)
WBC: 10.4 10*3/uL (ref 4.0–10.5)

## 2018-04-16 LAB — MRSA PCR SCREENING: MRSA by PCR: NEGATIVE

## 2018-04-16 LAB — HEPARIN LEVEL (UNFRACTIONATED)
HEPARIN UNFRACTIONATED: 0.48 [IU]/mL (ref 0.30–0.70)
Heparin Unfractionated: 0.6 IU/mL (ref 0.30–0.70)

## 2018-04-16 MED ORDER — LIDOCAINE HCL (PF) 1 % IJ SOLN
INTRAMUSCULAR | Status: AC
  Filled 2018-04-16: qty 30

## 2018-04-16 MED ORDER — WARFARIN SODIUM 4 MG PO TABS
4.0000 mg | ORAL_TABLET | Freq: Once | ORAL | Status: AC
  Administered 2018-04-16: 4 mg via ORAL
  Filled 2018-04-16: qty 1

## 2018-04-16 MED ORDER — VANCOMYCIN HCL 10 G IV SOLR
1500.0000 mg | INTRAVENOUS | Status: AC
  Administered 2018-04-16: 1500 mg via INTRAVENOUS
  Filled 2018-04-16: qty 1500

## 2018-04-16 MED ORDER — NALOXONE HCL 0.4 MG/ML IJ SOLN
INTRAMUSCULAR | Status: AC
  Filled 2018-04-16: qty 1

## 2018-04-16 MED ORDER — FENTANYL CITRATE (PF) 100 MCG/2ML IJ SOLN
INTRAMUSCULAR | Status: AC | PRN
  Administered 2018-04-16 (×2): 25 ug via INTRAVENOUS
  Administered 2018-04-16: 50 ug via INTRAVENOUS
  Administered 2018-04-16: 25 ug via INTRAVENOUS
  Administered 2018-04-16: 50 ug via INTRAVENOUS
  Administered 2018-04-16: 25 ug via INTRAVENOUS

## 2018-04-16 MED ORDER — SODIUM CHLORIDE 0.9 % IV SOLN
INTRAVENOUS | Status: DC
  Administered 2018-04-16: 15:00:00 via INTRAVENOUS
  Administered 2018-04-16: 1000 mL via INTRAVENOUS
  Administered 2018-04-17: 08:00:00 via INTRAVENOUS
  Administered 2018-04-17: 1000 mL via INTRAVENOUS

## 2018-04-16 MED ORDER — FENTANYL CITRATE (PF) 100 MCG/2ML IJ SOLN
INTRAMUSCULAR | Status: AC
  Filled 2018-04-16: qty 2

## 2018-04-16 MED ORDER — MIDAZOLAM HCL 2 MG/2ML IJ SOLN
INTRAMUSCULAR | Status: AC
  Filled 2018-04-16: qty 2

## 2018-04-16 MED ORDER — HEPARIN (PORCINE) IN NACL 100-0.45 UNIT/ML-% IJ SOLN
1500.0000 [IU]/h | INTRAMUSCULAR | Status: DC
  Administered 2018-04-16: 1500 [IU]/h via INTRAVENOUS
  Filled 2018-04-16 (×2): qty 250

## 2018-04-16 MED ORDER — IOPAMIDOL (ISOVUE-300) INJECTION 61%
INTRAVENOUS | Status: AC
  Filled 2018-04-16: qty 50

## 2018-04-16 MED ORDER — LIDOCAINE HCL (PF) 1 % IJ SOLN
INTRAMUSCULAR | Status: AC | PRN
  Administered 2018-04-16: 10 mL

## 2018-04-16 MED ORDER — WARFARIN - PHARMACIST DOSING INPATIENT: Freq: Every day | Status: DC

## 2018-04-16 MED ORDER — FLUMAZENIL 0.5 MG/5ML IV SOLN
INTRAVENOUS | Status: AC
  Filled 2018-04-16: qty 5

## 2018-04-16 MED ORDER — MIDAZOLAM HCL 2 MG/2ML IJ SOLN
INTRAMUSCULAR | Status: AC | PRN
  Administered 2018-04-16: 1 mg via INTRAVENOUS
  Administered 2018-04-16 (×4): 0.5 mg via INTRAVENOUS
  Administered 2018-04-16: 1 mg via INTRAVENOUS

## 2018-04-16 MED ORDER — LIDOCAINE-EPINEPHRINE (PF) 1 %-1:200000 IJ SOLN
INTRAMUSCULAR | Status: AC | PRN
  Administered 2018-04-16: 20 mL

## 2018-04-16 NOTE — Procedures (Signed)
  Procedure: Kyphoplasty L3 and L4   EBL:   minimal Complications:  none immediate  See full dictation in [facility] PACS.  Thora Lance. Gavriella Hearst MD Main # [phone] Pager  [phone]

## 2018-04-16 NOTE — Progress Notes (Signed)
ANTICOAGULATION CONSULT NOTE - Follow Up Consult  Pharmacy Consult for IV Heparin when INR < 2 Indication: pulmonary embolus (holding warfarin for surgery)  Patient Measurements: Height: 5\' 7"  (170.2 cm) Weight: 257 lb (116.6 kg) IBW/kg (Calculated) : 66.1 Heparin Dosing Weight: 93 kg  Vital Signs: Temp: 98.7 F (37.1 C) (05/30 2153) Temp Source: Oral (05/30 2153) BP: 161/78 (05/30 2153) Pulse Rate: 67 (05/30 2153)  Labs: Recent Labs    04/13/18 0501  04/15/18 0450 04/15/18 1506 04/16/18 0006  HGB 14.3  --  14.6  --  14.7  HCT 43.3  --  42.3  --  42.5  PLT 147*  --  184  --  209  LABPROT 23.7*   < > 26.3* 21.4* 18.3*  INR 2.14   < > 2.44 1.87 1.53  HEPARINUNFRC  --   --   --   --  0.48  CREATININE 1.31*  --   --   --  1.20   < > = values in this interval not displayed.    Estimated Creatinine Clearance: 73.9 mL/min (by C-G formula based on SCr of 1.2 mg/dL).   Medications:  Infusions:  . sodium chloride 1,000 mL (04/16/18 0048)  . heparin 1,500 Units/hr (04/15/18 1738)    Assessment: [age] yoM admitted on 5/27 with back pain and history of L4 compression fracture.  PMH is significant for chronic warfarin for history of PE, CVA.  Warfarin was resumed on admission, but is now held for pending kyphoplasty.  Goal INR < 1.6 prior to procedure.  Pharmacy is consulted to dose Heparin IV.  Home warfarin dose 5 mg M/W and 2.5 mg other days. LD 5/27 2200. INR=2.08 on admission.   5/30 05:00 INR increased to 2.44 despite no warfarin dose on 5/28.  Last dose given on 5/28.  MD ordered Vit K 2.5 mg PO x1 dose this morning. 15:00 Repeat INR decreased to 1.87 Today, 5/31 0006 HL=0.48 at goal, no infusion or bleeding issues per RN. 0006 INR=1.53    Goal of Therapy:  INR 2-3 Heparin level 0.3-0.7 units/ml Monitor platelets by anticoagulation protocol: Yes   Plan:   Continue heparin drip at 1500 units/hr  Daily INR, heparin level, and CBC  Continue to monitor H&H and  platelets    Lorenza EvangelistGreen, Halston Kintz R 04/16/2018 12:57 AM

## 2018-04-16 NOTE — Sedation Documentation (Signed)
Patient is resting comfortably with eyes closed, in NAD. 

## 2018-04-16 NOTE — Progress Notes (Signed)
ANTICOAGULATION CONSULT NOTE - Follow Up Consult  Pharmacy Consult for IV Heparin + Warfarin Indication: history of PE/stroke  Patient Measurements: Height:  (170.2 cm) Weight: 257 lb (116.6 kg) IBW/kg (Calculated) : 66.1 Heparin Dosing Weight: 93 kg  Vital Signs: Temp: 98 F (36.7 C) (05/31 1846) Temp Source: Oral (05/31 1846) BP: 148/75 (05/31 1846) Pulse Rate: 63 (05/31 1846)  Labs: Recent Labs    04/15/18 0450 04/15/18 1506 04/16/18 0006 04/16/18 0752 04/16/18 1258  HGB 14.6  --  14.7  --   --   HCT 42.3  --  42.5  --   --   PLT 184  --  209  --   --   LABPROT 26.3* 21.4* 18.3*  --  15.2  INR 2.44 1.87 1.53  --  1.21  HEPARINUNFRC  --   --  0.48 0.60  --   CREATININE  --   --  1.20  --   --    Estimated Creatinine Clearance: 73.9 mL/min (by C-G formula based on SCr of 1.2 mg/dL).   Assessment: 89 yoM admitted on 5/27 with back pain and history of L4 compression fracture.  PMH is significant for chronic warfarin for history of PE, CVA.  Warfarin was resumed on admission, but subsequently held for pending kyphoplasty. Goal INR < 1.6 prior to procedure. Vitamin K 2.5mg  PO x 1 given on 5/30 at 0802 to reverse INR. Pharmacy consulted to start IV heparin IV when INR < 2 --> started on 5/30 at 1738. Heparin stopped at 0859 this morning for procedure. Patient had kyphoplasty done today (completed at 1629). Dr. Deanne Coffer with IR placed order at 1708 to resume heparin in 2 hours if patient stable. Spoke to Dr. Catha Gosselin, who also asked pharmacy to resume warfarin today.   Home warfarin dose 5 mg Mon/Wed and 2.5 mg all other days. LD 5/27 2200. INR = 2.08 on admission.   Today, 5/31  Per RN, no bleeding issues and VS stable  CBC WNL  INR 1.21  Goal of Therapy:  INR 2-3 Heparin level 0.3-0.7 units/ml Monitor platelets by anticoagulation protocol: Yes   Plan:   Resume heparin infusion at previous rate of 1500 units/hr  Warfarin  PO x 1  Heparin level 6 hours  after infusion resumed  Daily CBC, heparin level, PT/INR  Continue IV heparin infusion until INR therapeutic  Monitor closely for s/sx of bleeding   Greer Pickerel, PharmD, BCPS Pager: [phone] 04/16/2018 7:16 PM

## 2018-04-16 NOTE — Progress Notes (Signed)
ANTICOAGULATION CONSULT NOTE - Follow Up Consult  Pharmacy Consult for IV Heparin when INR < 2 Indication: pulmonary embolus (holding warfarin for surgery)  Patient Measurements: Height:  (170.2 cm) Weight: 257 lb (116.6 kg) IBW/kg (Calculated) : 66.1 Heparin Dosing Weight: 93 kg  Vital Signs: Temp: 98.3 F (36.8 C) (05/31 0551) Temp Source: Oral (05/31 0551) BP: 159/79 (05/31 0911) Pulse Rate: 71 (05/31 0911)  Labs: Recent Labs    04/15/18 0450 04/15/18 1506 04/16/18 0006 04/16/18 0752  HGB 14.6  --  14.7  --   HCT 42.3  --  42.5  --   PLT 184  --  209  --   LABPROT 26.3* 21.4* 18.3*  --   INR 2.44 1.87 1.53  --   HEPARINUNFRC  --   --  0.48 0.60  CREATININE  --   --  1.20  --    Estimated Creatinine Clearance: 73.9 mL/min (by C-G formula based on SCr of 1.2 mg/dL).  Medications:  Infusions:  . sodium chloride 1,000 mL (04/16/18 0048)  . heparin Stopped (04/16/18 0859)    Assessment: 43 yoM admitted on 5/27 with back pain and history of L4 compression fracture.  PMH is significant for chronic warfarin for history of PE, CVA.  Warfarin was resumed on admission, but is now held for pending kyphoplasty.  Goal INR < 1.6 prior to procedure.  Pharmacy is consulted to dose Heparin IV.  Home warfarin dose 5 mg M/W and 2.5 mg other days. LD 5/27 2200. INR=2.08 on admission.   Yesterday, 5/30  05:00 INR increased to 2.44 despite no warfarin dose on 5/28.  Last dose given on 5/28.  MD ordered Vit K 2.5 mg PO x1   15:00 Repeat INR decreased to 1.87  Begin Heparin infusion at 1500 units/hr, no loading dose  Today, 5/31  0006 HL=0.48 at goal, no infusion or bleeding issues per RN, INR=1.53  0800 HL = 0.60 units/ml, no change Heparin rate  CBC remains wnl  Goal of Therapy:  INR 2-3 Heparin level 0.3-0.7 units/ml Monitor platelets by anticoagulation protocol: Yes   Plan:   Heparin discontinued at 0900 for Kyphoplasty, schedule unknown   Daily INR, heparin  level, and CBC  Continue to monitor H&H and platelets  Otho Bellows PharmD Pager [phone] 04/16/2018, 10:20 AM

## 2018-04-16 NOTE — Progress Notes (Signed)
English as a second language teachernsurance Authorization for SNF placement-[facility] received. Patient can D/c to SNF when medically stable.   Vivi BarrackNicole Hazel Leveille, Theresia MajorsLCSWA, MSW Clinical Social Worker  [phone]

## 2018-04-16 NOTE — Care Management Important Message (Signed)
Important Message  Patient Details  Name: Dakota Hall MRN: [id number] Date of Birth: 01/16/1951   Medicare Important Message Given:  Yes    [name], [name] 04/16/2018, 11:27 AMImportant Message  Patient Details  Name: Dakota Hall MRN: [id number] Date of Birth: 05/12/1951   Medicare Important Message Given:  Yes    [name], [name] 04/16/2018, 11:27 AM

## 2018-04-16 NOTE — Progress Notes (Signed)
PROGRESS NOTE    Dakota Hall  ZOX:[id number] DOB: [birth year] DOA: 04/12/2018 PCP: Lorenda Ishihara, MD   Brief Narrative:  HPI On 5/28/219 by Dr. Midge Minium Dakota Hall is a 67 y.o. male with history of diabetes mellitus type 2, stroke with right-sided hemiplegia, pulmonary embolism, hypertension who was admitted and discharged on Mar 26, 2018 for L5 compression fracture presents to the ER back with worsening pain.  Patient states his pain worsened last few days.  Pain is worsened on trying to ambulate.  Denies any incontinence of urine or bowel.  Interim history Found to have L4 compression fracture.  Interventional radiology consulted for possible kyphoplasty today 5/31.  Assessment & Plan   Lower back pain with L4 compression fracture -CT lumbar spine showed mild progression of height loss at L4 compared to 03/24/2018.  Unchanged appearance of L1 compression fracture.  Mild spinal canal narrowing L3-4, L4-5 with mild to moderate bilateral L4-5 foraminal stenosis. -MRI lumbar spine showed acute or subacute compression fractures of L3 and L4 with vertebral body height loss at L3 approximately 40% -Interventional radiology consulted and appreciated for possible kyphoplasty, currently pending insurance authorization and improvement in INR -PT consulted recommended SNF. -Patient states that he may not want to go to rehab.  Discussed with him the reasons for going to rehab, he will consider. -Social work consult -Continue pain control.  Patient started on Solu-Medrol to aid with pain (will wean) -Patient was given vitamin K 2.5 mg orally on 04/15/2018.  INR currently 1.53 -Patient was started on heparin, which will be held prior to kyphoplasty.  History of pulmonary embolism -Patient on Coumadin, received 1 dose during hospitalization -INR currently 1.53 -Patient will likely need to have a therapeutic INR prior to discharge.  Diabetes mellitus, type II -Continue  Actos -Given that patient was placed on IV Solu-Medrol, will place on insulin sliding scale with CBG monitoring  Essential hypertension -Continue amlodipine, hydralazine, Avapro, imdur, lasix  History of stroke -With right-sided hemi-plegia -As above, patient on Coumadin which is currently held -Continue statin  Bipolar disorder -Stable, continue Abilify and Zoloft  CKD, stage III -Stable, continue to monitor BMP  DVT Prophylaxis  heparin  Code Status: Full  Family Communication: None at bedside  Disposition Plan: Admitted. Continue pain control, with narcotics and IV steroids. Pending kyphoplasty. Dispo to SNF, however patient will consider this.   Consultants Interventional radiology  Procedures  None  Antibiotics   Anti-infectives (From admission, onward)   None      Subjective:   Dakota Hall seen and examined today.  Patient looking forward to surgery. Still has back pain. Denies chest pain, shortness of breath, abdominal pain, N/V/D/C.   Objective:   Vitals:   04/15/18 1500 04/15/18 2153 04/16/18 0551 04/16/18 0911  BP: (!) 158/85 (!) 161/78 (!) 175/81 (!) 159/79  Pulse: 69 67 63 71  Resp: Temp: 98.2 F (36.8 C) 98.7 F (37.1 C) 98.3 F (36.8 C)   TempSrc: Oral Oral Oral   SpO2: 95% 93% 92% 96%  Weight:      Height:        Intake/Output Summary (Last 24 hours) at 04/16/2018 1204 Last data filed at 04/16/2018 1000 Gross per 24 hour  Intake 620.25 ml  Output 1325 ml  Net -704.75 ml   Filed Weights   04/12/18 1952  Weight: 116.6 kg (257 lb)   Exam  General: Well developed, well nourished, NAD, appears stated age  HEENT:  NCAT, mucous membranes moist.   Neck: Supple  Cardiovascular: S1 S2 auscultated, RRR, no murmur  Respiratory: Mild expiratory wheezing, diminished breath sounds  Abdomen: Soft, nontender, nondistended, + bowel sounds  Extremities: warm dry without cyanosis clubbing or edema  Neuro: AAOx3, chronic right  sided hemiplegia, otherwise nonfocal  Psych: Appropriate mood and affect   Data Reviewed: I have personally reviewed following labs and imaging studies  CBC: Recent Labs  Lab 04/12/18 2122 04/13/18 0501 04/15/18 0450 04/16/18 0006  WBC 8.9 7.9 10.4 10.4  HGB 15.5 14.3 14.6 14.7  HCT 45.5 43.3 42.3 42.5  MCV 88.9 89.8 86.9 86.6  PLT 164 147* 184 209   Basic Metabolic Panel: Recent Labs  Lab 04/12/18 2122 04/13/18 0501 04/16/18 0006  NA 144 145 140  K 4.4 4.3 3.8  CL 108 108 106  CO2 GLUCOSE 111* 117* 161*  BUN 21* 21* 32*  CREATININE 1.33* 1.31* 1.20  CALCIUM 9.7 9.2 8.8*   GFR: Estimated Creatinine Clearance: 73.9 mL/min (by C-G formula based on SCr of 1.2 mg/dL). Liver Function Tests: No results for input(s): AST, ALT, ALKPHOS, BILITOT, PROT, ALBUMIN in the last 168 hours. No results for input(s): LIPASE, AMYLASE in the last 168 hours. No results for input(s): AMMONIA in the last 168 hours. Coagulation Profile: Recent Labs  Lab 04/13/18 0501 04/14/18 0413 04/15/18 0450 04/15/18 1506 04/16/18 0006  INR 2.14 2.32 2.44 1.87 1.53   Cardiac Enzymes: No results for input(s): CKTOTAL, CKMB, CKMBINDEX, TROPONINI in the last 168 hours. BNP (last 3 results) No results for input(s): PROBNP in the last 8760 hours. HbA1C: No results for input(s): HGBA1C in the last 72 hours. CBG: Recent Labs  Lab 04/15/18 1200 04/15/18 1652 04/15/18 2151 04/16/18 0728 04/16/18 1129  GLUCAP 148* 198* 183* 187* 170*   Lipid Profile: No results for input(s): CHOL, HDL, LDLCALC, TRIG, CHOLHDL, LDLDIRECT in the last 72 hours. Thyroid Function Tests: No results for input(s): TSH, T4TOTAL, FREET4, T3FREE, THYROIDAB in the last 72 hours. Anemia Panel: No results for input(s): VITAMINB12, FOLATE, FERRITIN, TIBC, IRON, RETICCTPCT in the last 72 hours. Urine analysis:    Component Value Date/Time   COLORURINE YELLOW 04/12/2018 2127   APPEARANCEUR CLEAR 04/12/2018 2127    LABSPEC 1.015 04/12/2018 2127   PHURINE 5.0 04/12/2018 2127   GLUCOSEU NEGATIVE 04/12/2018 2127   HGBUR SMALL (A) 04/12/2018 2127   BILIRUBINUR NEGATIVE 04/12/2018 2127   KETONESUR NEGATIVE 04/12/2018 2127   PROTEINUR NEGATIVE 04/12/2018 2127   UROBILINOGEN 1.0 04/22/2014 1340   NITRITE NEGATIVE 04/12/2018 2127   LEUKOCYTESUR NEGATIVE 04/12/2018 2127   Sepsis Labs: (procalcitonin:4,lacticidven:4)  ) Recent Results (from the past 240 hour(s))  MRSA PCR Screening     Status: None   Collection Time: 04/16/18  5:22 AM  Result Value Ref Range Status   MRSA by PCR NEGATIVE NEGATIVE Final    Comment:        The GeneXpert MRSA Assay (FDA approved for NASAL specimens only), is one component of a comprehensive MRSA colonization surveillance program. It is not intended to diagnose MRSA infection nor to guide or monitor treatment for MRSA infections. Performed at [facility], [address], [place], [place] [address]       Radiology Studies: No results found.   Scheduled Meds: . amLODipine  10 mg Oral Daily  . ARIPiprazole  2.5 mg Oral Daily  . docusate sodium  100 mg Oral Q M,W,F  . furosemide  20 mg  Oral Daily  . hydrALAZINE  25 mg Oral BID  . insulin aspart  0-9 Units Subcutaneous TID WC  . irbesartan  37.5 mg Oral Daily  . isosorbide mononitrate  30 mg Oral Daily  . lidocaine  1 patch Transdermal Q24H  . methocarbamol  500 mg Oral TID  . methylPREDNISolone (SOLU-MEDROL) injection  60 mg Intravenous Q12H  . nystatin   Topical BID  . pioglitazone  7.5 mg Oral Daily  . potassium chloride SA  20 mEq Oral Daily  . sertraline  150 mg Oral Daily  . simvastatin  20 mg Oral q1800  . Vitamin D (Ergocalciferol)  50,000 Units Oral Q Fri   Continuous Infusions: . sodium chloride 1,000 mL (04/16/18 0048)  . heparin Stopped (04/16/18 0859)     LOS: 2 days   Time Spent in minutes   30 minutes  Julyana Woolverton D.O. on 04/16/2018 at 12:04  PM  Between 7am to 7pm - Pager - [phone]  After 7pm go to [email or web address] - password [email or web address]  And look for the night coverage person covering for me after hours  [facility] Office  [phone]

## 2018-04-17 LAB — CBC
HCT: 42.7 % (ref 39.0–52.0)
Hemoglobin: 14.8 g/dL (ref 13.0–17.0)
MCH: 29.7 pg (ref 26.0–34.0)
MCHC: 34.7 g/dL (ref 30.0–36.0)
MCV: 85.7 fL (ref 78.0–100.0)
Platelets: 202 10*3/uL (ref 150–400)
RBC: 4.98 MIL/uL (ref 4.22–5.81)
RDW: 12.5 % (ref 11.5–15.5)
WBC: 12.4 10*3/uL — AB (ref 4.0–10.5)

## 2018-04-17 LAB — GLUCOSE, CAPILLARY
GLUCOSE-CAPILLARY: 226 mg/dL — AB (ref 65–99)
Glucose-Capillary: 188 mg/dL — ABNORMAL HIGH (ref 65–99)
Glucose-Capillary: 138 mg/dL — ABNORMAL HIGH (ref 65–99)
Glucose-Capillary: 141 mg/dL — ABNORMAL HIGH (ref 65–99)

## 2018-04-17 LAB — HEPARIN LEVEL (UNFRACTIONATED)
HEPARIN UNFRACTIONATED: 0.37 [IU]/mL (ref 0.30–0.70)
Heparin Unfractionated: 0.8 IU/mL — ABNORMAL HIGH (ref 0.30–0.70)
Heparin Unfractionated: 0.75 IU/mL — ABNORMAL HIGH (ref 0.30–0.70)

## 2018-04-17 LAB — PROTIME-INR
INR: 1.18
PROTHROMBIN TIME: 14.9 s (ref 11.4–15.2)

## 2018-04-17 MED ORDER — WARFARIN SODIUM 4 MG PO TABS
4.0000 mg | ORAL_TABLET | Freq: Once | ORAL | Status: DC
  Filled 2018-04-17: qty 1

## 2018-04-17 MED ORDER — CLONIDINE HCL 0.1 MG PO TABS
0.1000 mg | ORAL_TABLET | Freq: Once | ORAL | Status: AC
  Administered 2018-04-17: 0.1 mg via ORAL
  Filled 2018-04-17: qty 1

## 2018-04-17 MED ORDER — WARFARIN SODIUM 2.5 MG PO TABS
7.5000 mg | ORAL_TABLET | Freq: Once | ORAL | Status: AC
  Administered 2018-04-17: 7.5 mg via ORAL
  Filled 2018-04-17: qty 1

## 2018-04-17 MED ORDER — HEPARIN (PORCINE) IN NACL 100-0.45 UNIT/ML-% IJ SOLN
1400.0000 [IU]/h | INTRAMUSCULAR | Status: DC
  Administered 2018-04-17: 1400 [IU]/h via INTRAVENOUS
  Filled 2018-04-17: qty 250

## 2018-04-17 MED ORDER — HEPARIN (PORCINE) IN NACL 100-0.45 UNIT/ML-% IJ SOLN
1300.0000 [IU]/h | INTRAMUSCULAR | Status: DC
Start: 2018-04-17 — End: 2018-04-19
  Administered 2018-04-17 – 2018-04-19 (×3): 1300 [IU]/h via INTRAVENOUS
  Filled 2018-04-17 (×4): qty 250

## 2018-04-17 NOTE — Progress Notes (Signed)
Pt BP-186/93, Paged oncall. Waiting for  call back / NP response .

## 2018-04-17 NOTE — Progress Notes (Signed)
ANTICOAGULATION CONSULT NOTE - Follow Up Consult  Pharmacy Consult for IV Heparin + Warfarin Indication: history of PE/stroke  Patient Measurements: Height: 5\' 7"  (170.2 cm) Weight: 257 lb (116.6 kg) IBW/kg (Calculated) : 66.1 Heparin Dosing Weight: 93 kg  Vital Signs: Temp: 98.6 F (37 C) (05/31 2114) Temp Source: Oral (05/31 2114) BP: 155/82 (05/31 2114) Pulse Rate: 68 (05/31 2114)  Labs: Recent Labs    04/15/18 0450 04/15/18 1506 04/16/18 0006 04/16/18 0752 04/16/18 1258 04/17/18 0108  HGB 14.6  --  14.7  --   --  14.8  HCT 42.3  --  42.5  --   --  42.7  PLT 184  --  209  --   --  202  LABPROT 26.3* 21.4* 18.3*  --  15.2  --   INR 2.44 1.87 1.53  --  1.21  --   HEPARINUNFRC  --   --  0.48 0.60  --  0.37  CREATININE  --   --  1.20  --   --   --    Estimated Creatinine Clearance: 73.9 mL/min (by C-G formula based on SCr of 1.2 mg/dL).   Assessment: [age] yoM admitted on 5/27 with back pain and history of L4 compression fracture.  PMH is significant for chronic warfarin for history of PE, CVA.  Warfarin was resumed on admission, but subsequently held for pending kyphoplasty. Goal INR < 1.6 prior to procedure. Vitamin K 2.5mg  PO x 1 given on 5/30 at 0802 to reverse INR. Pharmacy consulted to start IV heparin IV when INR < 2 --> started on 5/30 at 1738. Heparin stopped at 0859 this morning for procedure. Patient had kyphoplasty done today (completed at 1629). Dr. Deanne CofferHassell with IR placed order at 1708 to resume heparin in 2 hours if patient stable. Spoke to Dr. Catha GosselinMikhail, who also asked pharmacy to resume warfarin today.   Home warfarin dose 5 mg Mon/Wed and 2.5 mg all other days. LD 5/27 2200. INR = 2.08 on admission.   Today, 5/31  Per RN, no bleeding issues and VS stable  CBC WNL  INR 1.21 Today, 6/1  0108 HL=0.37 at goal, no infusion or bleeding issues per RN.   Goal of Therapy:  INR 2-3 Heparin level 0.3-0.7 units/ml Monitor platelets by anticoagulation protocol:  Yes   Plan:   Continue heparin drip at 1500 unit/shr  Daily CBC, heparin level, PT/INR  Recheck HL at 0900 today to ensure stays in range  Continue IV heparin infusion until INR therapeutic  Monitor closely for s/sx of bleeding     Lorenza EvangelistGreen, Ludivina Guymon R. 04/17/2018 Lorenza EvangelistGreen, Lochlan Grygiel R

## 2018-04-17 NOTE — Progress Notes (Addendum)
ANTICOAGULATION CONSULT NOTE - Follow Up Consult  Pharmacy Consult for IV Heparin + Warfarin Indication: history of PE  Patient Measurements: Height: 5\' 7"  (170.2 cm) Weight: 257 lb (116.6 kg) IBW/kg (Calculated) : 66.1 Heparin Dosing Weight: 93 kg  Vital Signs: Temp: 98.5 F (36.9 C) (06/01 0548) Temp Source: Oral (06/01 0548) BP: 180/85 (06/01 0820) Pulse Rate: 61 (06/01 0820)  Labs: Recent Labs    04/15/18 0450  04/16/18 0006 04/16/18 0752 04/16/18 1258 04/17/18 0108  HGB 14.6  --  14.7  --   --  14.8  HCT 42.3  --  42.5  --   --  42.7  PLT 184  --  209  --   --  202  LABPROT 26.3*   < > 18.3*  --  15.2 14.9  INR 2.44   < > 1.53  --  1.21 1.18  HEPARINUNFRC  --   --  0.48 0.60  --  0.37  CREATININE  --   --  1.20  --   --   --    < > = values in this interval not displayed.   Estimated Creatinine Clearance: 73.9 mL/min (by C-G formula based on SCr of 1.2 mg/dL).   Assessment: [age] yoM admitted on 5/27 with back pain and history of L4 compression fracture.  PMH is significant for chronic warfarin for history of PE, CVA.  Warfarin was resumed on admission, but subsequently held for pending kyphoplasty. Goal INR < 1.6 prior to procedure. Vitamin K 2.5mg  PO x 1 given on 5/30 at 0802 to reverse INR. Pharmacy consulted to start IV heparin IV when INR < 2 --> started on 5/30 at 1738. Heparin stopped at 0859 on 5/31 for procedure. Patient had kyphoplasty on 5/31 (completed at 1629). Heparin drip resumed 2 hrs post procedure Dr. Deanne CofferHassell with IR  and warfarin resumed on 5/31 per Dr. Catha GosselinMikhail  - Home warfarin dose 5 mg Mon/Wed and 2.5 mg all other days. LD 5/27 2200. INR = 2.08 on admission.   Today, 04/17/2018: - heparin level is supra-therapeutic at 0.80 - INR is subtherapeutic at 1.18 (vit K 2.5 mg x1 on 5/30) - heparin level is therapeutic at  - cbc stable - no bleeding documented - no significant drug-drug intxns   Goal of Therapy:  INR 2-3 Heparin level 0.3-0.7  units/ml Monitor platelets by anticoagulation protocol: Yes   Plan:  - Decrease heparin drip to 1400 units/hr - check 6 hr heparin level - repeat Warfarin 4mg  PO x 1 today - Continue IV heparin infusion until INR therapeutic - Monitor closely for s/sx of bleeding  Adden (at [facility]2PM): per Dr. Cristopher EstimableMikhail's request, will increase warfarin dose to 7.5mg  (instead of 4 mg) since pt received 2.5 mg of vit K with this admission)  Dorna LeitzAnh Westen Dinino, PharmD, BCPS 04/17/2018 9:46 AM

## 2018-04-17 NOTE — Progress Notes (Signed)
ANTICOAGULATION CONSULT NOTE - Follow Up Consult  Pharmacy Consult for heparin Indication: history of PE   Assessment: For full details see heparin note from [facility] Endosc[facility] Pham Pharm D from earlier today. 1549 HL= 0.75 supratherapeutic No bleeding or other issues per RN  Goal of Therapy:  Heparin level 0.3-0.7 units/ml Monitor platelets by anticoagulation protocol: Yes    Plan:  Decrease heparin drip to 1300 units/hr Recheck heparin level in 6 hours Continue warfarin as previously ordered  Arley PhenixEllen Vallie Teters RPh 04/17/2018, 4:41 PM Pager [phone]

## 2018-04-17 NOTE — Progress Notes (Signed)
PROGRESS NOTE    Dakota Hall  WUJ:[id number] DOB: [birth year] DOA: 04/12/2018 PCP: Lorenda Ishihara, MD   Brief Narrative:  HPI On 5/28/219 by Dr. Midge Minium Dakota Hall is a 67 y.o. male with history of diabetes mellitus type 2, stroke with right-sided hemiplegia, pulmonary embolism, hypertension who was admitted and discharged on Mar 26, 2018 for L5 compression fracture presents to the ER back with worsening pain.  Patient states his pain worsened last few days.  Pain is worsened on trying to ambulate.  Denies any incontinence of urine or bowel.  Interim history Found to have L4 compression fracture.  Interventional radiology consulted, s/p kyphoplasty.  Assessment & Plan   Lower back pain with L4 compression fracture -CT lumbar spine showed mild progression of height loss at L4 compared to 03/24/2018.  Unchanged appearance of L1 compression fracture.  Mild spinal canal narrowing L3-4, L4-5 with mild to moderate bilateral L4-5 foraminal stenosis. -MRI lumbar spine showed acute or subacute compression fractures of L3 and L4 with vertebral body height loss at L3 approximately 40% -Interventional radiology consulted and appreciated for possible kyphoplasty, currently pending insurance authorization and improvement in INR -PT consulted recommended SNF. -Patient states that he may not want to go to rehab.  Discussed with him the reasons for going to rehab, he will consider. -Social work consult -Continue pain control.  Patient started on Solu-Medrol to aid with pain - will transition to oral  -s/p kyphoplasity  History of pulmonary embolism -INR reversed for kyphoplasty with one dose of oral Vit K2.5mg   -INR currently 1.18, despite restarting coumadin -will need to have a therapeutic INR prior to discharge  Diabetes mellitus, type II -Continue Actos -Given that patient was placed on IV Solu-Medrol, will place on insulin sliding scale with CBG monitoring  Essential  hypertension -Continue amlodipine, hydralazine, Avapro, imdur, lasix  History of stroke -With right-sided hemi-plegia -As above, patient on Coumadin which is currently held -Continue statin  Bipolar disorder -Stable, continue Abilify and Zoloft  CKD, stage III -Stable, continue to monitor BMP  DVT Prophylaxis  heparin  Code Status: Full  Family Communication: None at bedside  Disposition Plan: Admitted. Pending therapeutic INR. Dispo pending, SNF vs home.  Consultants Interventional radiology  Procedures  IR KYPHO LUMBAR INC FX REDUCE BONE BX UNI/BIL CANNULATION INC/IMAGING   Antibiotics   Anti-infectives (From admission, onward)   Start     Dose/Rate Route Frequency Ordered Stop   04/16/18 1445  vancomycin (VANCOCIN) 1,500 mg in sodium chloride 0.9 % 500 mL IVPB     1,500 mg 250 mL/hr over 120 Minutes Intravenous To Radiology 04/16/18 1431 04/16/18 1753      Subjective:   Dakota Hall seen and examined today.  Patient states his back pain has improved. Denies current chest pain, shortness of breath, abdominal pain, nausea, vomiting, diarrhea, constipation, headache, dizziness. Not sure that he wants to go to nursing facility.   Objective:   Vitals:   04/16/18 1935 04/16/18 2114 04/17/18 0548 04/17/18 0820  BP: (!) 152/81 (!) 155/82 (!) 186/93 (!) 180/85  Pulse: 67 68 64 61  Resp: 18 18 18    Temp: 98.6 F (37 C) 98.6 F (37 C) 98.5 F (36.9 C)   TempSrc: Oral Oral Oral   SpO2: 97% 96% 93%   Weight:      Height:        Intake/Output Summary (Last 24 hours) at 04/17/2018 1350 Last data filed at 04/17/2018 1005 Gross per 24 hour  Intake 3275 ml  Output 1995 ml  Net 1280 ml   Filed Weights   04/12/18 1952  Weight: 116.6 kg (257 lb)   Exam  General: Well developed, well nourished, NAD, appears stated age  HEENT: NCAT, mucous membranes moist.   Neck: Supple  Cardiovascular: S1 S2 auscultated, no murmur, RRR  Respiratory: Clear to auscultation  bilaterally with equal chest rise  Abdomen: Soft, nontender, nondistended, + bowel sounds  Extremities: warm dry without cyanosis clubbing or edema  Neuro: AAOx3, chronic right sided hemiplegia, otherwise nonfocal  Psych: Appropriate mood and affect  Data Reviewed: I have personally reviewed following labs and imaging studies  CBC: Recent Labs  Lab 04/12/18 2122 04/13/18 0501 04/15/18 0450 04/16/18 0006 04/17/18 0108  WBC 8.9 7.9 10.4 10.4 12.4*  HGB 15.5 14.3 14.6 14.7 14.8  HCT 45.5 43.3 42.3 42.5 42.7  MCV 88.9 89.8 86.9 86.6 85.7  PLT 164 147* 184 209 202   Basic Metabolic Panel: Recent Labs  Lab 04/12/18 2122 04/13/18 0501 04/16/18 0006  NA 144 145 140  K 4.4 4.3 3.8  CL 108 108 106  CO2 24 24 27   GLUCOSE 111* 117* 161*  BUN 21* 21* 32*  CREATININE 1.33* 1.31* 1.20  CALCIUM 9.7 9.2 8.8*   GFR: Estimated Creatinine Clearance: 73.9 mL/min (by C-G formula based on SCr of 1.2 mg/dL). Liver Function Tests: No results for input(s): AST, ALT, ALKPHOS, BILITOT, PROT, ALBUMIN in the last 168 hours. No results for input(s): LIPASE, AMYLASE in the last 168 hours. No results for input(s): AMMONIA in the last 168 hours. Coagulation Profile: Recent Labs  Lab 04/15/18 0450 04/15/18 1506 04/16/18 0006 04/16/18 1258 04/17/18 0108  INR 2.44 1.87 1.53 1.21 1.18   Cardiac Enzymes: No results for input(s): CKTOTAL, CKMB, CKMBINDEX, TROPONINI in the last 168 hours. BNP (last 3 results) No results for input(s): PROBNP in the last 8760 hours. HbA1C: No results for input(s): HGBA1C in the last 72 hours. CBG: Recent Labs  Lab 04/16/18 1215 04/16/18 1718 04/16/18 2113 04/17/18 0757 04/17/18 1217  GLUCAP 132* 203* 143* 188* 138*   Lipid Profile: No results for input(s): CHOL, HDL, LDLCALC, TRIG, CHOLHDL, LDLDIRECT in the last 72 hours. Thyroid Function Tests: No results for input(s): TSH, T4TOTAL, FREET4, T3FREE, THYROIDAB in the last 72 hours. Anemia Panel: No  results for input(s): VITAMINB12, FOLATE, FERRITIN, TIBC, IRON, RETICCTPCT in the last 72 hours. Urine analysis:    Component Value Date/Time   COLORURINE YELLOW 04/12/2018 2127   APPEARANCEUR CLEAR 04/12/2018 2127   LABSPEC 1.015 04/12/2018 2127   PHURINE 5.0 04/12/2018 2127   GLUCOSEU NEGATIVE 04/12/2018 2127   HGBUR SMALL (A) 04/12/2018 2127   BILIRUBINUR NEGATIVE 04/12/2018 2127   KETONESUR NEGATIVE 04/12/2018 2127   PROTEINUR NEGATIVE 04/12/2018 2127   UROBILINOGEN 1.0 04/22/2014 1340   NITRITE NEGATIVE 04/12/2018 2127   LEUKOCYTESUR NEGATIVE 04/12/2018 2127   Sepsis Labs: @LABRCNTIP (procalcitonin:4,lacticidven:4)  ) Recent Results (from the past 240 hour(s))  MRSA PCR Screening     Status: None   Collection Time: 04/16/18  5:22 AM  Result Value Ref Range Status   MRSA by PCR NEGATIVE NEGATIVE Final    Comment:        The GeneXpert MRSA Assay (FDA approved for NASAL specimens only), is one component of a comprehensive MRSA colonization surveillance program. It is not intended to diagnose MRSA infection nor to guide or monitor treatment for MRSA infections. Performed at [facility], [address] [place],  [place] [address]       Radiology Studies: Ir Kypho Lumbar Inc Fx Reduce Bone Bx Uni/bil Cannulation Inc/imaging  Result Date: 04/16/2018 CLINICAL DATA:  Painful subacute lumbar L3 and L4 compression fracture deformities. EXAM: KYPHOPLASTY AT LUMBAR L3 KYPHOPLASTY AT LUMBAR L4 TECHNIQUE: The procedure, risks (including but not limited to bleeding, infection, organ damage), benefits, and alternatives were explained to the patient. Questions regarding the procedure were encouraged and answered. The patient understands and consents to the procedure. The patient was placed prone on the fluoroscopic table. The skin overlying the LUMBAR region was then prepped and draped in the usual sterile fashion. Maximal barrier sterile technique was utilized  including caps, mask, sterile gowns, sterile gloves, sterile drape, hand hygiene and skin antiseptic. Intravenous Fentanyl and Versed were administered as conscious sedation during continuous cardiorespiratory monitoring by the radiology RN, with a total moderate sedation time of 42 minutes. As antibiotic prophylaxis, VANCOMYCIN 1.5 G was ordered pre-procedure and administered intravenously within !one hour! of incision. The right pedicle at LUMBAR L4 was then infiltrated with 1% lidocaine followed by the advancement of a Kyphon trocar needle through the right pedicle into the posterior one-third. The trocar was removed and the osteo drill was advanced to the anterior third of the vertebral body. The osteo drill was retracted. Through the working cannula, a Kyphon inflatable bone tamp 15 x 2 was advanced and positioned with the distal marker 5 mm from the anterior aspect of the cortex. Crossing of the midline was not seen on the AP projection. At this time, the balloon was expanded using contrast via a Kyphon inflation syringe device via micro tubing. In similar fashion, the left pedicle was infiltrated with 1% lidocaine followed by the advancement of a second Kyphon trocar needle through the left pedicle into the posterior third of the vertebral body. The osteo drill was coaxially advanced to the anterior right third. The osteo drill was exchanged for a Kyphon inflatable bone tamp 15 x 2, advanced to the 5 mm of the anterior aspect of the cortex. The balloon was then expanded using contrast as above. Inflations were continued until there was near apposition across the midline and with the superior and the inferior end plates. In similar fashion at lumbar L3, was then infiltrated with 1% lidocaine followed by the advancement of a Kyphon trocar needle through the right pedicle into the posterior one-third. The trocar was removed and the osteo drill was advanced to the anterior third of the vertebral body. The osteo  drill was retracted. Through the working cannula, a Kyphon inflatable bone tamp 15 x 2 was advanced and positioned with the distal marker 5 mm from the anterior aspect of the cortex. Crossing of the midline was seen on the AP projection. At this time, the balloon was expanded using contrast via a Kyphon inflation syringe device via micro tubing. At this time, methylmethacrylate mixture was reconstituted in the Kyphon bone mixing device system. This was then loaded into the delivery mechanism, attached to Kyphon bone fillers. The balloons were deflated and removed followed by the instillation of methylmethacrylate mixture with excellent filling in the AP and lateral projections at both levels. No extravasation was noted in the disk spaces or posteriorly into the spinal canal. No epidural venous contamination was seen. The patient tolerated the procedure well. There were no acute complications. The working cannulae and the bone filler were then retrieved and removed. COMPLICATIONS: COMPLICATIONS None immediate. IMPRESSION: 1. Status post vertebral body augmentation using balloon kyphoplasty  at lumbar L3 as described without event. 2. Status post vertebral body augmentation using balloon kyphoplasty at lumbar L4 as described without event. 3. Per CMS PQRS reporting requirements (PQRS Measure 24): Given the patient's age of greater than 50 and the fracture site (hip, distal radius, or spine), the patient should be tested for osteoporosis using DXA, and the appropriate treatment considered based on the DXA results. Electronically Signed   By: Corlis Leak  Hassell M.D.   On: 04/16/2018 16:56   Ir Kypho Ea Addl Level Thoracic Or Lumbar  Result Date: 04/16/2018 CLINICAL DATA:  Painful subacute lumbar L3 and L4 compression fracture deformities. EXAM: KYPHOPLASTY AT LUMBAR L3 KYPHOPLASTY AT LUMBAR L4 TECHNIQUE: The procedure, risks (including but not limited to bleeding, infection, organ damage), benefits, and alternatives were  explained to the patient. Questions regarding the procedure were encouraged and answered. The patient understands and consents to the procedure. The patient was placed prone on the fluoroscopic table. The skin overlying the LUMBAR region was then prepped and draped in the usual sterile fashion. Maximal barrier sterile technique was utilized including caps, mask, sterile gowns, sterile gloves, sterile drape, hand hygiene and skin antiseptic. Intravenous Fentanyl and Versed were administered as conscious sedation during continuous cardiorespiratory monitoring by the radiology RN, with a total moderate sedation time of 42 minutes. As antibiotic prophylaxis, VANCOMYCIN 1.5 G was ordered pre-procedure and administered intravenously within !one hour! of incision. The right pedicle at LUMBAR L4 was then infiltrated with 1% lidocaine followed by the advancement of a Kyphon trocar needle through the right pedicle into the posterior one-third. The trocar was removed and the osteo drill was advanced to the anterior third of the vertebral body. The osteo drill was retracted. Through the working cannula, a Kyphon inflatable bone tamp 15 x 2 was advanced and positioned with the distal marker 5 mm from the anterior aspect of the cortex. Crossing of the midline was not seen on the AP projection. At this time, the balloon was expanded using contrast via a Kyphon inflation syringe device via micro tubing. In similar fashion, the left pedicle was infiltrated with 1% lidocaine followed by the advancement of a second Kyphon trocar needle through the left pedicle into the posterior third of the vertebral body. The osteo drill was coaxially advanced to the anterior right third. The osteo drill was exchanged for a Kyphon inflatable bone tamp 15 x 2, advanced to the 5 mm of the anterior aspect of the cortex. The balloon was then expanded using contrast as above. Inflations were continued until there was near apposition across the midline  and with the superior and the inferior end plates. In similar fashion at lumbar L3, was then infiltrated with 1% lidocaine followed by the advancement of a Kyphon trocar needle through the right pedicle into the posterior one-third. The trocar was removed and the osteo drill was advanced to the anterior third of the vertebral body. The osteo drill was retracted. Through the working cannula, a Kyphon inflatable bone tamp 15 x 2 was advanced and positioned with the distal marker 5 mm from the anterior aspect of the cortex. Crossing of the midline was seen on the AP projection. At this time, the balloon was expanded using contrast via a Kyphon inflation syringe device via micro tubing. At this time, methylmethacrylate mixture was reconstituted in the Kyphon bone mixing device system. This was then loaded into the delivery mechanism, attached to Kyphon bone fillers. The balloons were deflated and removed followed by the instillation of  methylmethacrylate mixture with excellent filling in the AP and lateral projections at both levels. No extravasation was noted in the disk spaces or posteriorly into the spinal canal. No epidural venous contamination was seen. The patient tolerated the procedure well. There were no acute complications. The working cannulae and the bone filler were then retrieved and removed. COMPLICATIONS: COMPLICATIONS None immediate. IMPRESSION: 1. Status post vertebral body augmentation using balloon kyphoplasty at lumbar L3 as described without event. 2. Status post vertebral body augmentation using balloon kyphoplasty at lumbar L4 as described without event. 3. Per CMS PQRS reporting requirements (PQRS Measure 24): Given the patient's age of greater than 50 and the fracture site (hip, distal radius, or spine), the patient should be tested for osteoporosis using DXA, and the appropriate treatment considered based on the DXA results. Electronically Signed   By: Corlis Leak M.D.   On: 04/16/2018 16:56      Scheduled Meds: . amLODipine  10 mg Oral Daily  . ARIPiprazole  2.5 mg Oral Daily  . docusate sodium  100 mg Oral Q M,W,F  . furosemide  20 mg Oral Daily  . hydrALAZINE  25 mg Oral BID  . insulin aspart  0-9 Units Subcutaneous TID WC  . irbesartan  37.5 mg Oral Daily  . isosorbide mononitrate  30 mg Oral Daily  . lidocaine  1 patch Transdermal Q24H  . methocarbamol  500 mg Oral TID  . methylPREDNISolone (SOLU-MEDROL) injection  60 mg Intravenous Q12H  . nystatin   Topical BID  . pioglitazone  7.5 mg Oral Daily  . potassium chloride SA  20 mEq Oral Daily  . sertraline  150 mg Oral Daily  . simvastatin  20 mg Oral q1800  . Vitamin D (Ergocalciferol)  50,000 Units Oral Q Fri  . warfarin  4 mg Oral ONCE-1800  . Warfarin - Pharmacist Dosing Inpatient   Does not apply q1800   Continuous Infusions: . sodium chloride 75 mL/hr at 04/17/18 0825  . heparin 1,400 Units/hr (04/17/18 1252)     LOS: 3 days   Time Spent in minutes   30 minutes  Kenyia Wambolt D.O. on 04/17/2018 at 1:50 PM  Between 7am to 7pm - Pager - [phone]  After 7pm go to [email or web address] - password [email or web address]  And look for the night coverage person covering for me after hours  [facility] Office  [phone]

## 2018-04-18 LAB — HEPARIN LEVEL (UNFRACTIONATED)
HEPARIN UNFRACTIONATED: 0.67 [IU]/mL (ref 0.30–0.70)
Heparin Unfractionated: 0.51 IU/mL (ref 0.30–0.70)

## 2018-04-18 LAB — CBC
HEMATOCRIT: 43.8 % (ref 39.0–52.0)
Hemoglobin: 15.4 g/dL (ref 13.0–17.0)
MCH: 29.9 pg (ref 26.0–34.0)
MCHC: 35.2 g/dL (ref 30.0–36.0)
MCV: 85 fL (ref 78.0–100.0)
PLATELETS: 181 10*3/uL (ref 150–400)
RBC: 5.15 MIL/uL (ref 4.22–5.81)
RDW: 12.3 % (ref 11.5–15.5)
WBC: 9.2 10*3/uL (ref 4.0–10.5)

## 2018-04-18 LAB — GLUCOSE, CAPILLARY
GLUCOSE-CAPILLARY: 176 mg/dL — AB (ref 65–99)
Glucose-Capillary: 155 mg/dL — ABNORMAL HIGH (ref 65–99)
Glucose-Capillary: 203 mg/dL — ABNORMAL HIGH (ref 65–99)

## 2018-04-18 LAB — PROTIME-INR
INR: 1.31
Prothrombin Time: 16.2 seconds — ABNORMAL HIGH (ref 11.4–15.2)

## 2018-04-18 MED ORDER — WARFARIN SODIUM 5 MG PO TABS
7.5000 mg | ORAL_TABLET | Freq: Once | ORAL | Status: AC
  Administered 2018-04-18: 7.5 mg via ORAL
  Filled 2018-04-18: qty 1

## 2018-04-18 MED ORDER — PREDNISONE 20 MG PO TABS
60.0000 mg | ORAL_TABLET | Freq: Every day | ORAL | Status: DC
  Administered 2018-04-18 – 2018-04-19 (×2): 60 mg via ORAL
  Filled 2018-04-18 (×2): qty 3

## 2018-04-18 MED ORDER — CLONIDINE HCL 0.1 MG PO TABS
0.1000 mg | ORAL_TABLET | Freq: Once | ORAL | Status: AC
  Administered 2018-04-18: 0.1 mg via ORAL
  Filled 2018-04-18: qty 1

## 2018-04-18 NOTE — Progress Notes (Signed)
ANTICOAGULATION CONSULT NOTE - Follow Up Consult  Pharmacy Consult for heparin Indication: history of PE   Assessment: For full details see heparin note from Arvella Nighnh Pham Pharm D from 6/1.  2349 HL= 0.51 at goal, no infusion or bleeding issue per RN.  No bleeding or other issues per RN  Goal of Therapy:  Heparin level 0.3-0.7 units/ml Monitor platelets by anticoagulation protocol: Yes    Plan:  Continue drip at 1300 units/hr Daily HL/CBC am Continue warfarin as previously ordered  Lorenza EvangelistGreen, Shahla Betsill R 04/18/2018, 12:33 AM

## 2018-04-18 NOTE — Progress Notes (Signed)
PROGRESS NOTE    Dakota Hall  ZOX:[id number] DOB: [birth year] DOA: 04/12/2018 PCP: Lorenda Ishihara, MD   Brief Narrative:  HPI On 5/28/219 by Dr. Midge Minium Dakota Hall is a 68 y.o. male with history of diabetes mellitus type 2, stroke with right-sided hemiplegia, pulmonary embolism, hypertension who was admitted and discharged on Mar 26, 2018 for L5 compression fracture presents to the ER back with worsening pain.  Patient states his pain worsened last few days.  Pain is worsened on trying to ambulate.  Denies any incontinence of urine or bowel.  Interim history Found to have L4 compression fracture.  Interventional radiology consulted, s/p kyphoplasty. Pending therapeutic INR  Assessment & Plan   Lower back pain with L4 compression fracture -CT lumbar spine showed mild progression of height loss at L4 compared to 03/24/2018.  Unchanged appearance of L1 compression fracture.  Mild spinal canal narrowing L3-4, L4-5 with mild to moderate bilateral L4-5 foraminal stenosis. -MRI lumbar spine showed acute or subacute compression fractures of L3 and L4 with vertebral body height loss at L3 approximately 40% -Interventional radiology consulted and appreciated for possible kyphoplasty, currently pending insurance authorization and improvement in INR -PT consulted recommended SNF. Patient agrees -Social work consult -Continue pain control -steroids transitioned to prednisone -s/p kyphoplasity  History of pulmonary embolism -INR reversed for kyphoplasty with one dose of oral Vit K2.5mg   -INR currently 1.31, discussed with pharmacy, will give 7.5mg  coumadin dose this evening -will need to have a therapeutic INR prior to discharge  Diabetes mellitus, type II -Continue Actos -Continue ISS and CBG monitoring   Essential hypertension -Continue amlodipine, hydralazine, Avapro, imdur, lasix  History of stroke -With right-sided hemi-plegia -As above, patient on Coumadin which has  now been restarted -Continue statin  Bipolar disorder -Stable, continue Abilify and Zoloft  CKD, stage III -Stable, continue to monitor BMP  DVT Prophylaxis  Heparin/coumadin  Code Status: Full  Family Communication: None at bedside  Disposition Plan: Admitted. Pending therapeutic INR. Dispo pending, SNF vs home.  Consultants Interventional radiology  Procedures  IR KYPHO LUMBAR INC FX REDUCE BONE BX UNI/BIL CANNULATION INC/IMAGING   Antibiotics   Anti-infectives (From admission, onward)   Start     Dose/Rate Route Frequency Ordered Stop   04/16/18 1445  vancomycin (VANCOCIN) 1,500 mg in sodium chloride 0.9 % 500 mL IVPB     1,500 mg 250 mL/hr over 120 Minutes Intravenous To Radiology 04/16/18 1431 04/16/18 1753      Subjective:   Dakota Hall seen and examined today.  Feels his back pain has improved. Denies current chest pain, shortness of breath, abdominal pain, N/V/D/C, dizziness, headache.  Objective:   Vitals:   04/18/18 0509 04/18/18 0800 04/18/18 0900 04/18/18 1058  BP: (!) 171/84 (!) 174/87 (!) 210/91 (!) 163/78  Pulse: 63 67 66 66  Resp: 16     Temp: 98.1 F (36.7 C)     TempSrc: Oral     SpO2: 92%     Weight:      Height:        Intake/Output Summary (Last 24 hours) at 04/18/2018 1131 Last data filed at 04/18/2018 1057 Gross per 24 hour  Intake 2740.8 ml  Output 2650 ml  Net 90.8 ml   Filed Weights   04/12/18 1952  Weight: 116.6 kg (257 lb)   Exam  General: Well developed, well nourished, NAD, appears stated age  HEENT: NCAT, mucous membranes moist.   Neck: Supple  Cardiovascular: S1 S2 auscultated, no  rubs, murmurs or gallops. Regular rate and rhythm.  Respiratory: Clear to auscultation bilaterally with equal chest rise  Abdomen: Soft, nontender, nondistended, + bowel sounds  Extremities: warm dry without cyanosis clubbing or edema  Neuro: AAOx3, chronic R hemiplegia, otherwise nonfocal  Psych: Pleasant, appropriate mood and  affect  Data Reviewed: I have personally reviewed following labs and imaging studies  CBC: Recent Labs  Lab 04/13/18 0501 04/15/18 0450 04/16/18 0006 04/17/18 0108 04/18/18 0358  WBC 7.9 10.4 10.4 12.4* 9.2  HGB 14.3 14.6 14.7 14.8 15.4  HCT 43.3 42.3 42.5 42.7 43.8  MCV 89.8 86.9 86.6 85.7 85.0  PLT 147* 184 209 202 181   Basic Metabolic Panel: Recent Labs  Lab 04/12/18 2122 04/13/18 0501 04/16/18 0006  NA 144 145 140  K 4.4 4.3 3.8  CL 108 108 106  CO2 24 24 27   GLUCOSE 111* 117* 161*  BUN 21* 21* 32*  CREATININE 1.33* 1.31* 1.20  CALCIUM 9.7 9.2 8.8*   GFR: Estimated Creatinine Clearance: 73.9 mL/min (by C-G formula based on SCr of 1.2 mg/dL). Liver Function Tests: No results for input(s): AST, ALT, ALKPHOS, BILITOT, PROT, ALBUMIN in the last 168 hours. No results for input(s): LIPASE, AMYLASE in the last 168 hours. No results for input(s): AMMONIA in the last 168 hours. Coagulation Profile: Recent Labs  Lab 04/15/18 1506 04/16/18 0006 04/16/18 1258 04/17/18 0108 04/18/18 0358  INR 1.87 1.53 1.21 1.18 1.31   Cardiac Enzymes: No results for input(s): CKTOTAL, CKMB, CKMBINDEX, TROPONINI in the last 168 hours. BNP (last 3 results) No results for input(s): PROBNP in the last 8760 hours. HbA1C: No results for input(s): HGBA1C in the last 72 hours. CBG: Recent Labs  Lab 04/17/18 0757 04/17/18 1217 04/17/18 1541 04/17/18 2059 04/18/18 0817  GLUCAP 188* 138* 141* 226* 176*   Lipid Profile: No results for input(s): CHOL, HDL, LDLCALC, TRIG, CHOLHDL, LDLDIRECT in the last 72 hours. Thyroid Function Tests: No results for input(s): TSH, T4TOTAL, FREET4, T3FREE, THYROIDAB in the last 72 hours. Anemia Panel: No results for input(s): VITAMINB12, FOLATE, FERRITIN, TIBC, IRON, RETICCTPCT in the last 72 hours. Urine analysis:    Component Value Date/Time   COLORURINE YELLOW 04/12/2018 2127   APPEARANCEUR CLEAR 04/12/2018 2127   LABSPEC 1.015 04/12/2018  2127   PHURINE 5.0 04/12/2018 2127   GLUCOSEU NEGATIVE 04/12/2018 2127   HGBUR SMALL (A) 04/12/2018 2127   BILIRUBINUR NEGATIVE 04/12/2018 2127   KETONESUR NEGATIVE 04/12/2018 2127   PROTEINUR NEGATIVE 04/12/2018 2127   UROBILINOGEN 1.0 04/22/2014 1340   NITRITE NEGATIVE 04/12/2018 2127   LEUKOCYTESUR NEGATIVE 04/12/2018 2127   Sepsis Labs: @LABRCNTIP (procalcitonin:4,lacticidven:4)  ) Recent Results (from the past 240 hour(s))  MRSA PCR Screening     Status: None   Collection Time: 04/16/18  5:22 AM  Result Value Ref Range Status   MRSA by PCR NEGATIVE NEGATIVE Final    Comment:        The GeneXpert MRSA Assay (FDA approved for NASAL specimens only), is one component of a comprehensive MRSA colonization surveillance program. It is not intended to diagnose MRSA infection nor to guide or monitor treatment for MRSA infections. Performed at [facility], [address] [place], [place] [address]       Radiology Studies: Ir Kypho Lumbar Inc Fx Reduce Bone Bx Uni/bil Cannulation Inc/imaging  Result Date: 04/16/2018 CLINICAL DATA:  Painful subacute lumbar L3 and L4 compression fracture deformities. EXAM: KYPHOPLASTY AT LUMBAR L3 KYPHOPLASTY AT LUMBAR L4 TECHNIQUE: The  procedure, risks (including but not limited to bleeding, infection, organ damage), benefits, and alternatives were explained to the patient. Questions regarding the procedure were encouraged and answered. The patient understands and consents to the procedure. The patient was placed prone on the fluoroscopic table. The skin overlying the LUMBAR region was then prepped and draped in the usual sterile fashion. Maximal barrier sterile technique was utilized including caps, mask, sterile gowns, sterile gloves, sterile drape, hand hygiene and skin antiseptic. Intravenous Fentanyl and Versed were administered as conscious sedation during continuous cardiorespiratory monitoring by the radiology RN, with a  total moderate sedation time of 42 minutes. As antibiotic prophylaxis, VANCOMYCIN 1.5 G was ordered pre-procedure and administered intravenously within !one hour! of incision. The right pedicle at LUMBAR L4 was then infiltrated with 1% lidocaine followed by the advancement of a Kyphon trocar needle through the right pedicle into the posterior one-third. The trocar was removed and the osteo drill was advanced to the anterior third of the vertebral body. The osteo drill was retracted. Through the working cannula, a Kyphon inflatable bone tamp 15 x 2 was advanced and positioned with the distal marker 5 mm from the anterior aspect of the cortex. Crossing of the midline was not seen on the AP projection. At this time, the balloon was expanded using contrast via a Kyphon inflation syringe device via micro tubing. In similar fashion, the left pedicle was infiltrated with 1% lidocaine followed by the advancement of a second Kyphon trocar needle through the left pedicle into the posterior third of the vertebral body. The osteo drill was coaxially advanced to the anterior right third. The osteo drill was exchanged for a Kyphon inflatable bone tamp 15 x 2, advanced to the 5 mm of the anterior aspect of the cortex. The balloon was then expanded using contrast as above. Inflations were continued until there was near apposition across the midline and with the superior and the inferior end plates. In similar fashion at lumbar L3, was then infiltrated with 1% lidocaine followed by the advancement of a Kyphon trocar needle through the right pedicle into the posterior one-third. The trocar was removed and the osteo drill was advanced to the anterior third of the vertebral body. The osteo drill was retracted. Through the working cannula, a Kyphon inflatable bone tamp 15 x 2 was advanced and positioned with the distal marker 5 mm from the anterior aspect of the cortex. Crossing of the midline was seen on the AP projection. At this  time, the balloon was expanded using contrast via a Kyphon inflation syringe device via micro tubing. At this time, methylmethacrylate mixture was reconstituted in the Kyphon bone mixing device system. This was then loaded into the delivery mechanism, attached to Kyphon bone fillers. The balloons were deflated and removed followed by the instillation of methylmethacrylate mixture with excellent filling in the AP and lateral projections at both levels. No extravasation was noted in the disk spaces or posteriorly into the spinal canal. No epidural venous contamination was seen. The patient tolerated the procedure well. There were no acute complications. The working cannulae and the bone filler were then retrieved and removed. COMPLICATIONS: COMPLICATIONS None immediate. IMPRESSION: 1. Status post vertebral body augmentation using balloon kyphoplasty at lumbar L3 as described without event. 2. Status post vertebral body augmentation using balloon kyphoplasty at lumbar L4 as described without event. 3. Per CMS PQRS reporting requirements (PQRS Measure 24): Given the patient's age of greater than 50 and the fracture site (hip, distal radius, or  spine), the patient should be tested for osteoporosis using DXA, and the appropriate treatment considered based on the DXA results. Electronically Signed   By: Corlis Leak M.D.   On: 04/16/2018 16:56   Ir Kypho Ea Addl Level Thoracic Or Lumbar  Result Date: 04/16/2018 CLINICAL DATA:  Painful subacute lumbar L3 and L4 compression fracture deformities. EXAM: KYPHOPLASTY AT LUMBAR L3 KYPHOPLASTY AT LUMBAR L4 TECHNIQUE: The procedure, risks (including but not limited to bleeding, infection, organ damage), benefits, and alternatives were explained to the patient. Questions regarding the procedure were encouraged and answered. The patient understands and consents to the procedure. The patient was placed prone on the fluoroscopic table. The skin overlying the LUMBAR region was then  prepped and draped in the usual sterile fashion. Maximal barrier sterile technique was utilized including caps, mask, sterile gowns, sterile gloves, sterile drape, hand hygiene and skin antiseptic. Intravenous Fentanyl and Versed were administered as conscious sedation during continuous cardiorespiratory monitoring by the radiology RN, with a total moderate sedation time of 42 minutes. As antibiotic prophylaxis, VANCOMYCIN 1.5 G was ordered pre-procedure and administered intravenously within !one hour! of incision. The right pedicle at LUMBAR L4 was then infiltrated with 1% lidocaine followed by the advancement of a Kyphon trocar needle through the right pedicle into the posterior one-third. The trocar was removed and the osteo drill was advanced to the anterior third of the vertebral body. The osteo drill was retracted. Through the working cannula, a Kyphon inflatable bone tamp 15 x 2 was advanced and positioned with the distal marker 5 mm from the anterior aspect of the cortex. Crossing of the midline was not seen on the AP projection. At this time, the balloon was expanded using contrast via a Kyphon inflation syringe device via micro tubing. In similar fashion, the left pedicle was infiltrated with 1% lidocaine followed by the advancement of a second Kyphon trocar needle through the left pedicle into the posterior third of the vertebral body. The osteo drill was coaxially advanced to the anterior right third. The osteo drill was exchanged for a Kyphon inflatable bone tamp 15 x 2, advanced to the 5 mm of the anterior aspect of the cortex. The balloon was then expanded using contrast as above. Inflations were continued until there was near apposition across the midline and with the superior and the inferior end plates. In similar fashion at lumbar L3, was then infiltrated with 1% lidocaine followed by the advancement of a Kyphon trocar needle through the right pedicle into the posterior one-third. The trocar was  removed and the osteo drill was advanced to the anterior third of the vertebral body. The osteo drill was retracted. Through the working cannula, a Kyphon inflatable bone tamp 15 x 2 was advanced and positioned with the distal marker 5 mm from the anterior aspect of the cortex. Crossing of the midline was seen on the AP projection. At this time, the balloon was expanded using contrast via a Kyphon inflation syringe device via micro tubing. At this time, methylmethacrylate mixture was reconstituted in the Kyphon bone mixing device system. This was then loaded into the delivery mechanism, attached to Kyphon bone fillers. The balloons were deflated and removed followed by the instillation of methylmethacrylate mixture with excellent filling in the AP and lateral projections at both levels. No extravasation was noted in the disk spaces or posteriorly into the spinal canal. No epidural venous contamination was seen. The patient tolerated the procedure well. There were no acute complications. The working cannulae  and the bone filler were then retrieved and removed. COMPLICATIONS: COMPLICATIONS None immediate. IMPRESSION: 1. Status post vertebral body augmentation using balloon kyphoplasty at lumbar L3 as described without event. 2. Status post vertebral body augmentation using balloon kyphoplasty at lumbar L4 as described without event. 3. Per CMS PQRS reporting requirements (PQRS Measure 24): Given the patient's age of greater than 50 and the fracture site (hip, distal radius, or spine), the patient should be tested for osteoporosis using DXA, and the appropriate treatment considered based on the DXA results. Electronically Signed   By: Corlis Leak M.D.   On: 04/16/2018 16:56     Scheduled Meds: . amLODipine  10 mg Oral Daily  . ARIPiprazole  2.5 mg Oral Daily  . docusate sodium  100 mg Oral Q M,W,F  . furosemide  20 mg Oral Daily  . hydrALAZINE  25 mg Oral BID  . insulin aspart  0-9 Units Subcutaneous TID WC    . irbesartan  37.5 mg Oral Daily  . isosorbide mononitrate  30 mg Oral Daily  . lidocaine  1 patch Transdermal Q24H  . methocarbamol  500 mg Oral TID  . nystatin   Topical BID  . pioglitazone  7.5 mg Oral Daily  . potassium chloride SA  20 mEq Oral Daily  . predniSONE  60 mg Oral Q breakfast  . sertraline  150 mg Oral Daily  . simvastatin  20 mg Oral q1800  . Vitamin D (Ergocalciferol)  50,000 Units Oral Q Fri  . warfarin  7.5 mg Oral ONCE-1800  . Warfarin - Pharmacist Dosing Inpatient   Does not apply q1800   Continuous Infusions: . heparin 1,300 Units/hr (04/18/18 0823)     LOS: 4 days   Time Spent in minutes   30 minutes  Glennys Schorsch D.O. on 04/18/2018 at 11:31 AM  Between 7am to 7pm - Pager - [phone]  After 7pm go to [email or web address] - password [email or web address]  And look for the night coverage person covering for me after hours  [facility] Office  [phone]

## 2018-04-18 NOTE — Progress Notes (Signed)
Chief Complaint: Patient was seen today for follow up L3/L4 KP   Supervising Physician: Simonne ComeWatts, John  [facility]  Subjective: Pt feeling pretty good. Still some soreness but overall pain from fractures much improved compared to before procedure.   Objective: Physical Exam: BP (!) 163/78 (BP Location: Right Arm)   Pulse 66   Temp 98.1 F (36.7 C) (Oral)   Resp 16   Ht 5\' 7"  (1.702 m)   Wt 257 lb (116.6 kg)   SpO2 92%   BMI 40.25 kg/m  Puncture sites clean, dry, no hematoma, NT   Current Facility-Administered Medications:  .  acetaminophen (TYLENOL) tablet 650 mg, 650 mg, Oral, Q6H PRN, 650 mg at 04/13/18 0758 **OR** acetaminophen (TYLENOL) suppository 650 mg, 650 mg, Rectal, Q6H PRN, Eduard ClosKakrakandy, Arshad N, MD .  amLODipine (NORVASC) tablet 10 mg, 10 mg, Oral, Daily, Eduard ClosKakrakandy, Arshad N, MD, 10 mg at 04/18/18 [id number] .  ARIPiprazole (ABILIFY) tablet 2.5 mg, 2.5 mg, Oral, Daily, Eduard ClosKakrakandy, Arshad N, MD, 2.5 mg at 04/18/18 0940 .  docusate sodium (COLACE) capsule 100 mg, 100 mg, Oral, Q M,W,F, Toniann FailKakrakandy, Arshad N, MD, 100 mg at 04/16/18 [id number] .  furosemide (LASIX) tablet 20 mg, 20 mg, Oral, Daily, Eduard ClosKakrakandy, Arshad N, MD, 20 mg at 04/18/18 [id number] .  heparin ADULT infusion 100 units/mL (25000 units/25350mL sodium chloride 0.45%), 1,300 Units/hr, Intravenous, Continuous, Maurice MarchJackson, Rachel E, [facility], Last Rate: 13 mL/hr at 04/18/18 0823, 1,300 Units/hr at 04/18/18 0823 .  hydrALAZINE (APRESOLINE) tablet 25 mg, 25 mg, Oral, BID, Eduard ClosKakrakandy, Arshad N, MD, 25 mg at 04/18/18 [id number] .  insulin aspart (novoLOG) injection 0-9 Units, 0-9 Units, Subcutaneous, TID WC, Eduard ClosKakrakandy, Arshad N, MD, 2 Units at 04/18/18 [phone] .  irbesartan (AVAPRO) tablet 37.5 mg, 37.5 mg, Oral, Daily, Eduard ClosKakrakandy, Arshad N, MD, 37.5 mg at 04/18/18 0936 .  isosorbide mononitrate (IMDUR) 24 hr tablet 30 mg, 30 mg, Oral, Daily, Eduard ClosKakrakandy, Arshad N, MD, 30 mg at 04/18/18 0938 .  lidocaine (LIDODERM) 5 % 1 patch,  1 patch, Transdermal, Q24H, Eduard ClosKakrakandy, Arshad N, MD, 1 patch at 04/17/18 2302 .  methocarbamol (ROBAXIN) tablet 500 mg, 500 mg, Oral, TID, Rhetta MuraSamtani, Jai-Gurmukh, MD, 500 mg at 04/18/18 0937 .  nystatin (MYCOSTATIN/NYSTOP) topical powder, , Topical, BID, Mikhail, Maryann, DO .  ondansetron (ZOFRAN) tablet 4 mg, 4 mg, Oral, Q6H PRN **OR** ondansetron (ZOFRAN) injection 4 mg, 4 mg, Intravenous, Q6H PRN, Eduard ClosKakrakandy, Arshad N, MD .  pioglitazone (ACTOS) tablet 7.5 mg, 7.5 mg, Oral, Daily, Eduard ClosKakrakandy, Arshad N, MD, 7.5 mg at 04/18/18 0939 .  potassium chloride SA (K-DUR,KLOR-CON) CR tablet 20 mEq, 20 mEq, Oral, Daily, Eduard ClosKakrakandy, Arshad N, MD, 20 mEq at 04/18/18 0939 .  predniSONE (DELTASONE) tablet 60 mg, 60 mg, Oral, Q breakfast, Edsel PetrinMikhail, Maryann, DO, 60 mg at 04/18/18 1055 .  sertraline (ZOLOFT) tablet 150 mg, 150 mg, Oral, Daily, Eduard ClosKakrakandy, Arshad N, MD, 150 mg at 04/18/18 [id number] .  simvastatin (ZOCOR) tablet 20 mg, 20 mg, Oral, q1800, Eduard ClosKakrakandy, Arshad N, MD, 20 mg at 04/17/18 1736 .  traMADol (ULTRAM) tablet 100 mg, 100 mg, Oral, Q6H PRN, Rhetta MuraSamtani, Jai-Gurmukh, MD, 100 mg at 04/15/18 1419 .  Vitamin D (Ergocalciferol) (DRISDOL) capsule 50,000 Units, 50,000 Units, Oral, Q Leeanne DeedFri, Kakrakandy, Arshad N, MD, 50,000 Units at 04/16/18 [phone] .  warfarin (COUMADIN) tablet 7.5 mg, 7.5 mg, Oral, ONCE-1800, Pham, Anh P, RPH .  Warfarin - Pharmacist Dosing Inpatient, , Does not apply, q1800, Jamse MeadGadhia, Jigna M, [facility]  Labs:  CBC Recent Labs    04/17/18 0108 04/18/18 0358  WBC 12.4* 9.2  HGB 14.8 15.4  HCT 42.7 43.8  PLT 202 181   BMET Recent Labs    04/16/18 0006  NA 140  K 3.8  CL 106  CO2 27  GLUCOSE 161*  BUN 32*  CREATININE 1.20  CALCIUM 8.8*   LFT No results for input(s): PROT, ALBUMIN, AST, ALT, ALKPHOS, BILITOT, BILIDIR, IBILI, LIPASE in the last 72 hours. PT/INR Recent Labs    04/17/18 0108 04/18/18 0358  LABPROT 14.9 16.2*  INR 1.18 1.31     Studies/Results: Ir Kypho Lumbar Inc  Fx Reduce Bone Bx Uni/bil Cannulation Inc/imaging  Result Date: 04/16/2018 CLINICAL DATA:  Painful subacute lumbar L3 and L4 compression fracture deformities. EXAM: KYPHOPLASTY AT LUMBAR L3 KYPHOPLASTY AT LUMBAR L4 TECHNIQUE: The procedure, risks (including but not limited to bleeding, infection, organ damage), benefits, and alternatives were explained to the patient. Questions regarding the procedure were encouraged and answered. The patient understands and consents to the procedure. The patient was placed prone on the fluoroscopic table. The skin overlying the LUMBAR region was then prepped and draped in the usual sterile fashion. Maximal barrier sterile technique was utilized including caps, mask, sterile gowns, sterile gloves, sterile drape, hand hygiene and skin antiseptic. Intravenous Fentanyl and Versed were administered as conscious sedation during continuous cardiorespiratory monitoring by the radiology RN, with a total moderate sedation time of 42 minutes. As antibiotic prophylaxis, VANCOMYCIN 1.5 G was ordered pre-procedure and administered intravenously within !one hour! of incision. The right pedicle at LUMBAR L4 was then infiltrated with 1% lidocaine followed by the advancement of a Kyphon trocar needle through the right pedicle into the posterior one-third. The trocar was removed and the osteo drill was advanced to the anterior third of the vertebral body. The osteo drill was retracted. Through the working cannula, a Kyphon inflatable bone tamp 15 x 2 was advanced and positioned with the distal marker 5 mm from the anterior aspect of the cortex. Crossing of the midline was not seen on the AP projection. At this time, the balloon was expanded using contrast via a Kyphon inflation syringe device via micro tubing. In similar fashion, the left pedicle was infiltrated with 1% lidocaine followed by the advancement of a second Kyphon trocar needle through the left pedicle into the posterior third of the  vertebral body. The osteo drill was coaxially advanced to the anterior right third. The osteo drill was exchanged for a Kyphon inflatable bone tamp 15 x 2, advanced to the 5 mm of the anterior aspect of the cortex. The balloon was then expanded using contrast as above. Inflations were continued until there was near apposition across the midline and with the superior and the inferior end plates. In similar fashion at lumbar L3, was then infiltrated with 1% lidocaine followed by the advancement of a Kyphon trocar needle through the right pedicle into the posterior one-third. The trocar was removed and the osteo drill was advanced to the anterior third of the vertebral body. The osteo drill was retracted. Through the working cannula, a Kyphon inflatable bone tamp 15 x 2 was advanced and positioned with the distal marker 5 mm from the anterior aspect of the cortex. Crossing of the midline was seen on the AP projection. At this time, the balloon was expanded using contrast via a Kyphon inflation syringe device via micro tubing. At this time, methylmethacrylate mixture was reconstituted in the Kyphon bone mixing device system. This  was then loaded into the delivery mechanism, attached to Kyphon bone fillers. The balloons were deflated and removed followed by the instillation of methylmethacrylate mixture with excellent filling in the AP and lateral projections at both levels. No extravasation was noted in the disk spaces or posteriorly into the spinal canal. No epidural venous contamination was seen. The patient tolerated the procedure well. There were no acute complications. The working cannulae and the bone filler were then retrieved and removed. COMPLICATIONS: COMPLICATIONS None immediate. IMPRESSION: 1. Status post vertebral body augmentation using balloon kyphoplasty at lumbar L3 as described without event. 2. Status post vertebral body augmentation using balloon kyphoplasty at lumbar L4 as described without event.  3. Per CMS PQRS reporting requirements (PQRS Measure 24): Given the patient's age of greater than 50 and the fracture site (hip, distal radius, or spine), the patient should be tested for osteoporosis using DXA, and the appropriate treatment considered based on the DXA results. Electronically Signed   By: Corlis Leak M.D.   On: 04/16/2018 16:56   Ir Kypho Ea Addl Level Thoracic Or Lumbar  Result Date: 04/16/2018 CLINICAL DATA:  Painful subacute lumbar L3 and L4 compression fracture deformities. EXAM: KYPHOPLASTY AT LUMBAR L3 KYPHOPLASTY AT LUMBAR L4 TECHNIQUE: The procedure, risks (including but not limited to bleeding, infection, organ damage), benefits, and alternatives were explained to the patient. Questions regarding the procedure were encouraged and answered. The patient understands and consents to the procedure. The patient was placed prone on the fluoroscopic table. The skin overlying the LUMBAR region was then prepped and draped in the usual sterile fashion. Maximal barrier sterile technique was utilized including caps, mask, sterile gowns, sterile gloves, sterile drape, hand hygiene and skin antiseptic. Intravenous Fentanyl and Versed were administered as conscious sedation during continuous cardiorespiratory monitoring by the radiology RN, with a total moderate sedation time of 42 minutes. As antibiotic prophylaxis, VANCOMYCIN 1.5 G was ordered pre-procedure and administered intravenously within !one hour! of incision. The right pedicle at LUMBAR L4 was then infiltrated with 1% lidocaine followed by the advancement of a Kyphon trocar needle through the right pedicle into the posterior one-third. The trocar was removed and the osteo drill was advanced to the anterior third of the vertebral body. The osteo drill was retracted. Through the working cannula, a Kyphon inflatable bone tamp 15 x 2 was advanced and positioned with the distal marker 5 mm from the anterior aspect of the cortex. Crossing of the  midline was not seen on the AP projection. At this time, the balloon was expanded using contrast via a Kyphon inflation syringe device via micro tubing. In similar fashion, the left pedicle was infiltrated with 1% lidocaine followed by the advancement of a second Kyphon trocar needle through the left pedicle into the posterior third of the vertebral body. The osteo drill was coaxially advanced to the anterior right third. The osteo drill was exchanged for a Kyphon inflatable bone tamp 15 x 2, advanced to the 5 mm of the anterior aspect of the cortex. The balloon was then expanded using contrast as above. Inflations were continued until there was near apposition across the midline and with the superior and the inferior end plates. In similar fashion at lumbar L3, was then infiltrated with 1% lidocaine followed by the advancement of a Kyphon trocar needle through the right pedicle into the posterior one-third. The trocar was removed and the osteo drill was advanced to the anterior third of the vertebral body. The osteo drill was retracted. Through  the working cannula, a Kyphon inflatable bone tamp 15 x 2 was advanced and positioned with the distal marker 5 mm from the anterior aspect of the cortex. Crossing of the midline was seen on the AP projection. At this time, the balloon was expanded using contrast via a Kyphon inflation syringe device via micro tubing. At this time, methylmethacrylate mixture was reconstituted in the Kyphon bone mixing device system. This was then loaded into the delivery mechanism, attached to Kyphon bone fillers. The balloons were deflated and removed followed by the instillation of methylmethacrylate mixture with excellent filling in the AP and lateral projections at both levels. No extravasation was noted in the disk spaces or posteriorly into the spinal canal. No epidural venous contamination was seen. The patient tolerated the procedure well. There were no acute complications. The  working cannulae and the bone filler were then retrieved and removed. COMPLICATIONS: COMPLICATIONS None immediate. IMPRESSION: 1. Status post vertebral body augmentation using balloon kyphoplasty at lumbar L3 as described without event. 2. Status post vertebral body augmentation using balloon kyphoplasty at lumbar L4 as described without event. 3. Per CMS PQRS reporting requirements (PQRS Measure 24): Given the patient's age of greater than 50 and the fracture site (hip, distal radius, or spine), the patient should be tested for osteoporosis using DXA, and the appropriate treatment considered based on the DXA results. Electronically Signed   By: Corlis Leak M.D.   On: 04/16/2018 16:56    Assessment/Plan: S/p L3 and L4 Kyphoplasty for symptomatic compression fractures. May work with PT May need rehab. Can follow up with Dr. Deanne Coffer in clinic in a couple weeks if needed. Will leave DC instructions/Clinic #s in DC section.    LOS: 4 days   I spent a total of 15 minutes in face to face in clinical consultation, greater than 50% of which was counseling/coordinating care for post KP  Brayton El PA-C 04/18/2018 11:43 AM

## 2018-04-18 NOTE — Progress Notes (Signed)
ANTICOAGULATION CONSULT NOTE - Follow Up Consult  Pharmacy Consult for IV Heparin + Warfarin Indication: history of PE  Patient Measurements: Height: 5\' 7"  (170.2 cm) Weight: 257 lb (116.6 kg) IBW/kg (Calculated) : 66.1 Heparin Dosing Weight: 93 kg  Vital Signs: Temp: 98.1 F (36.7 C) (06/02 0509) Temp Source: Oral (06/02 0509) BP: 174/87 (06/02 0800) Pulse Rate: 67 (06/02 0800)  Labs: Recent Labs    04/16/18 0006  04/16/18 1258 04/17/18 0108  04/17/18 1549 04/17/18 2349 04/18/18 0358  HGB 14.7  --   --  14.8  --   --   --  15.4  HCT 42.5  --   --  42.7  --   --   --  43.8  PLT 209  --   --  202  --   --   --  181  LABPROT 18.3*  --  15.2 14.9  --   --   --  16.2*  INR 1.53  --  1.21 1.18  --   --   --  1.31  HEPARINUNFRC 0.48   < >  --  0.37   < > 0.75* 0.51 0.67  CREATININE 1.20  --   --   --   --   --   --   --    < > = values in this interval not displayed.   Estimated Creatinine Clearance: 73.9 mL/min (by C-G formula based on SCr of 1.2 mg/dL).   Assessment: [age] yoM admitted on 5/27 with back pain and history of L4 compression fracture.  PMH is significant for chronic warfarin for history of PE, CVA.  Warfarin was resumed on admission, but subsequently held for pending kyphoplasty. Goal INR < 1.6 prior to procedure. Vitamin K 2.5mg  PO x 1 given on 5/30 at 0802 to reverse INR. Pharmacy consulted to start IV heparin IV when INR < 2 --> started on 5/30 at 1738. Heparin stopped at 0859 on 5/31 for procedure. Patient had kyphoplasty on 5/31 (completed at 1629). Heparin drip resumed 2 hrs post procedure Dr. Deanne CofferHassell with IR  and warfarin resumed on 5/31 per Dr. Catha GosselinMikhail  - Home warfarin dose 5 mg Mon/Wed and 2.5 mg all other days. LD 5/27 2200. INR = 2.08 on admission.   Today, 04/18/2018: - heparin level is therapeutic at 0.67 - INR is subtherapeutic but up from 1.18 to 1.31 with 7.5 mg dose given yesterday (vit K 2.5 mg x1 on 5/30) - cbc stable - no bleeding documented -  no significant drug-drug intxns   Goal of Therapy:  INR 2-3 Heparin level 0.3-0.7 units/ml Monitor platelets by anticoagulation protocol: Yes   Plan:  - continue heparin drip at 1300 units/hr - Per Dr. Catha GosselinMikhail, preference is for Warfarin 7.5 mg PO x 1 dose today - Continue IV heparin infusion until INR therapeutic - Monitor closely for s/sx of bleeding   Dorna LeitzAnh Rylynn Schoneman, PharmD, BCPS 04/18/2018 9:12 AM

## 2018-04-19 DIAGNOSIS — Z4789 Encounter for other orthopedic aftercare: Secondary | ICD-10-CM | POA: Diagnosis not present

## 2018-04-19 DIAGNOSIS — E119 Type 2 diabetes mellitus without complications: Secondary | ICD-10-CM | POA: Diagnosis not present

## 2018-04-19 DIAGNOSIS — M6281 Muscle weakness (generalized): Secondary | ICD-10-CM | POA: Diagnosis not present

## 2018-04-19 DIAGNOSIS — F319 Bipolar disorder, unspecified: Secondary | ICD-10-CM | POA: Diagnosis not present

## 2018-04-19 DIAGNOSIS — Z86711 Personal history of pulmonary embolism: Secondary | ICD-10-CM | POA: Diagnosis not present

## 2018-04-19 DIAGNOSIS — D72829 Elevated white blood cell count, unspecified: Secondary | ICD-10-CM | POA: Diagnosis not present

## 2018-04-19 DIAGNOSIS — I6789 Other cerebrovascular disease: Secondary | ICD-10-CM | POA: Diagnosis not present

## 2018-04-19 DIAGNOSIS — M545 Low back pain: Secondary | ICD-10-CM | POA: Diagnosis not present

## 2018-04-19 DIAGNOSIS — I69359 Hemiplegia and hemiparesis following cerebral infarction affecting unspecified side: Secondary | ICD-10-CM | POA: Diagnosis not present

## 2018-04-19 DIAGNOSIS — Z8673 Personal history of transient ischemic attack (TIA), and cerebral infarction without residual deficits: Secondary | ICD-10-CM | POA: Diagnosis not present

## 2018-04-19 DIAGNOSIS — Z9889 Other specified postprocedural states: Secondary | ICD-10-CM | POA: Diagnosis not present

## 2018-04-19 DIAGNOSIS — Z743 Need for continuous supervision: Secondary | ICD-10-CM | POA: Diagnosis not present

## 2018-04-19 DIAGNOSIS — S32040D Wedge compression fracture of fourth lumbar vertebra, subsequent encounter for fracture with routine healing: Secondary | ICD-10-CM | POA: Diagnosis not present

## 2018-04-19 DIAGNOSIS — M5442 Lumbago with sciatica, left side: Secondary | ICD-10-CM | POA: Diagnosis not present

## 2018-04-19 DIAGNOSIS — M4856XD Collapsed vertebra, not elsewhere classified, lumbar region, subsequent encounter for fracture with routine healing: Secondary | ICD-10-CM | POA: Diagnosis not present

## 2018-04-19 DIAGNOSIS — I69351 Hemiplegia and hemiparesis following cerebral infarction affecting right dominant side: Secondary | ICD-10-CM | POA: Diagnosis not present

## 2018-04-19 DIAGNOSIS — R262 Difficulty in walking, not elsewhere classified: Secondary | ICD-10-CM | POA: Diagnosis not present

## 2018-04-19 DIAGNOSIS — S32040A Wedge compression fracture of fourth lumbar vertebra, initial encounter for closed fracture: Secondary | ICD-10-CM | POA: Diagnosis not present

## 2018-04-19 DIAGNOSIS — R791 Abnormal coagulation profile: Secondary | ICD-10-CM | POA: Diagnosis not present

## 2018-04-19 DIAGNOSIS — I10 Essential (primary) hypertension: Secondary | ICD-10-CM | POA: Diagnosis not present

## 2018-04-19 DIAGNOSIS — G9340 Encephalopathy, unspecified: Secondary | ICD-10-CM | POA: Diagnosis not present

## 2018-04-19 DIAGNOSIS — E118 Type 2 diabetes mellitus with unspecified complications: Secondary | ICD-10-CM | POA: Diagnosis not present

## 2018-04-19 DIAGNOSIS — S32030A Wedge compression fracture of third lumbar vertebra, initial encounter for closed fracture: Secondary | ICD-10-CM | POA: Diagnosis not present

## 2018-04-19 DIAGNOSIS — F418 Other specified anxiety disorders: Secondary | ICD-10-CM | POA: Diagnosis not present

## 2018-04-19 DIAGNOSIS — I2699 Other pulmonary embolism without acute cor pulmonale: Secondary | ICD-10-CM | POA: Diagnosis not present

## 2018-04-19 DIAGNOSIS — I129 Hypertensive chronic kidney disease with stage 1 through stage 4 chronic kidney disease, or unspecified chronic kidney disease: Secondary | ICD-10-CM | POA: Diagnosis not present

## 2018-04-19 DIAGNOSIS — R279 Unspecified lack of coordination: Secondary | ICD-10-CM | POA: Diagnosis not present

## 2018-04-19 DIAGNOSIS — S32040S Wedge compression fracture of fourth lumbar vertebra, sequela: Secondary | ICD-10-CM | POA: Diagnosis not present

## 2018-04-19 DIAGNOSIS — E1159 Type 2 diabetes mellitus with other circulatory complications: Secondary | ICD-10-CM | POA: Diagnosis not present

## 2018-04-19 DIAGNOSIS — R488 Other symbolic dysfunctions: Secondary | ICD-10-CM | POA: Diagnosis not present

## 2018-04-19 DIAGNOSIS — N183 Chronic kidney disease, stage 3 (moderate): Secondary | ICD-10-CM | POA: Diagnosis not present

## 2018-04-19 LAB — CBC
HEMATOCRIT: 42.9 % (ref 39.0–52.0)
Hemoglobin: 15.2 g/dL (ref 13.0–17.0)
MCH: 29.9 pg (ref 26.0–34.0)
MCHC: 35.4 g/dL (ref 30.0–36.0)
MCV: 84.4 fL (ref 78.0–100.0)
Platelets: 177 10*3/uL (ref 150–400)
RBC: 5.08 MIL/uL (ref 4.22–5.81)
RDW: 12.5 % (ref 11.5–15.5)
WBC: 12.1 10*3/uL — AB (ref 4.0–10.5)

## 2018-04-19 LAB — PROTIME-INR
INR: 1.93
PROTHROMBIN TIME: 21.9 s — AB (ref 11.4–15.2)

## 2018-04-19 LAB — GLUCOSE, CAPILLARY
Glucose-Capillary: 112 mg/dL — ABNORMAL HIGH (ref 65–99)
Glucose-Capillary: 207 mg/dL — ABNORMAL HIGH (ref 65–99)

## 2018-04-19 LAB — BASIC METABOLIC PANEL
ANION GAP: 9 (ref 5–15)
BUN: 30 mg/dL — AB (ref 6–20)
CHLORIDE: 107 mmol/L (ref 101–111)
CO2: 22 mmol/L (ref 22–32)
Calcium: 8.5 mg/dL — ABNORMAL LOW (ref 8.9–10.3)
Creatinine, Ser: 1.16 mg/dL (ref 0.61–1.24)
Glucose, Bld: 136 mg/dL — ABNORMAL HIGH (ref 65–99)
POTASSIUM: 3.6 mmol/L (ref 3.5–5.1)
SODIUM: 138 mmol/L (ref 135–145)

## 2018-04-19 LAB — HEPARIN LEVEL (UNFRACTIONATED): HEPARIN UNFRACTIONATED: 0.74 [IU]/mL — AB (ref 0.30–0.70)

## 2018-04-19 MED ORDER — NYSTATIN 100000 UNIT/GM EX POWD: Freq: Two times a day (BID) | CUTANEOUS | 0 refills | Status: DC

## 2018-04-19 MED ORDER — PREDNISONE 10 MG PO TABS: ORAL_TABLET | ORAL | Status: DC

## 2018-04-19 MED ORDER — ENOXAPARIN SODIUM 120 MG/0.8ML ~~LOC~~ SOLN: 120.0000 mg | Freq: Two times a day (BID) | SUBCUTANEOUS | Status: DC

## 2018-04-19 MED ORDER — ACETAMINOPHEN 325 MG PO TABS: 650.0000 mg | ORAL_TABLET | Freq: Four times a day (QID) | ORAL | Status: AC | PRN

## 2018-04-19 MED ORDER — WARFARIN SODIUM 5 MG PO TABS: 5.0000 mg | ORAL_TABLET | Freq: Once | ORAL | Status: DC

## 2018-04-19 MED ORDER — BACLOFEN 20 MG PO TABS: 20.0000 mg | ORAL_TABLET | Freq: Three times a day (TID) | ORAL | 0 refills | Status: DC | PRN

## 2018-04-19 MED ORDER — ENOXAPARIN SODIUM 120 MG/0.8ML ~~LOC~~ SOLN
120.0000 mg | Freq: Two times a day (BID) | SUBCUTANEOUS | Status: DC
  Administered 2018-04-19: 120 mg via SUBCUTANEOUS
  Filled 2018-04-19: qty 0.8

## 2018-04-19 MED ORDER — TRAMADOL HCL 50 MG PO TABS: 100.0000 mg | ORAL_TABLET | Freq: Four times a day (QID) | ORAL | 0 refills | Status: DC | PRN

## 2018-04-19 MED ORDER — HEPARIN (PORCINE) IN NACL 100-0.45 UNIT/ML-% IJ SOLN
1150.0000 [IU]/h | INTRAMUSCULAR | Status: DC
  Filled 2018-04-19: qty 250

## 2018-04-19 NOTE — Progress Notes (Signed)
[facility] All questions answered. [facility]

## 2018-04-19 NOTE — Progress Notes (Signed)
ANTICOAGULATION CONSULT NOTE - Follow Up Consult  Pharmacy Consult for Lovenox, Warfarin Indication: history of PE  Patient Measurements: Height: 5\' 7"  (170.2 cm) Weight: 257 lb (116.6 kg) IBW/kg (Calculated) : 66.1 Heparin Dosing Weight: 93 kg  Vital Signs: Temp: 98.5 F (36.9 C) (06/03 0518) Temp Source: Oral (06/03 0518) BP: 164/79 (06/03 0920) Pulse Rate: 63 (06/03 0920)  Labs: Recent Labs    04/17/18 0108  04/17/18 2349 04/18/18 0358 04/19/18 0412  HGB 14.8  --   --  15.4 15.2  HCT 42.7  --   --  43.8 42.9  PLT 202  --   --  181 177  LABPROT 14.9  --   --  16.2* 21.9*  INR 1.18  --   --  1.31 1.93  HEPARINUNFRC 0.37   < > 0.51 0.67 0.74*  CREATININE  --   --   --   --  1.16   < > = values in this interval not displayed.   Estimated Creatinine Clearance: 76.5 mL/min (by C-G formula based on SCr of 1.16 mg/dL).  Assessment: [age] yoM admitted on 5/27 with back pain and history of L4 compression fracture.  PMH is significant for chronic warfarin for history of PE, CVA.  Warfarin was resumed on admission, but subsequently held for pending kyphoplasty. Goal INR < 1.6 prior to procedure. Vitamin K 2.5mg  PO x 1 given on 5/30 at 0802 to reverse INR. Pharmacy consulted to start IV heparin IV when INR < 2 --> started on 5/30 at 1738. Heparin stopped at 0859 on 5/31 for procedure. Patient had kyphoplasty on 5/31 (completed at 1629). Heparin drip resumed 2 hrs post procedure Dr. Deanne CofferHassell with IR  and warfarin resumed on 5/31 per Dr. Catha GosselinMikhail  - Home warfarin dose 5 mg Mon/Wed and 2.5 mg all other days. LD 5/27 2200. INR = 2.08 on admission.   6/2 - heparin level therapeutic at 0.67 - INR is subtherapeutic but up from 1.18 to 1.31 with 7.5 mg dose given yesterday (vit K 2.5 mg x1 on 5/30) - no significant drug-drug intxns  Today, 04/19/2018 - 0412 HL=0.74 slightly above goal, no infusion or bleeding issues per RN - Heparin rate decreased to 1150 units/hr - INR=1.93 this am - Change  Heparin to Lovenox for discharge  Goal of Therapy:  INR 2-3 Heparin level 0.3-0.7 units/ml Monitor platelets by anticoagulation protocol: Yes   Plan:   Heparin discontinued at 10:30  Lovenox 120mg  SQ q12 to begin at 11:30 (one hr after Heparin d/c)  Plan discharge on Lovenox bridge  Resume Home Warfarin, plan today's Warfarin at home (will order dose if patient not discharged  Dakota Hall, Dakota Hall L PharmD Pager [phone] 04/19/2018, 11:17 AM

## 2018-04-19 NOTE — Progress Notes (Signed)
ANTICOAGULATION CONSULT NOTE - Follow Up Consult  Pharmacy Consult for IV Heparin + Warfarin Indication: history of PE  Patient Measurements: Height: 5\' 7"  (170.2 cm) Weight: 257 lb (116.6 kg) IBW/kg (Calculated) : 66.1 Heparin Dosing Weight: 93 kg  Vital Signs: Temp: 98.5 F (36.9 C) (06/03 0518) Temp Source: Oral (06/03 0518) BP: 156/83 (06/03 0518) Pulse Rate: 58 (06/03 0518)  Labs: Recent Labs    04/17/18 0108  04/17/18 2349 04/18/18 0358 04/19/18 0412  HGB 14.8  --   --  15.4 15.2  HCT 42.7  --   --  43.8 42.9  PLT 202  --   --  181 177  LABPROT 14.9  --   --  16.2* 21.9*  INR 1.18  --   --  1.31 1.93  HEPARINUNFRC 0.37   < > 0.51 0.67 0.74*  CREATININE  --   --   --   --  1.16   < > = values in this interval not displayed.   Estimated Creatinine Clearance: 76.5 mL/min (by C-G formula based on SCr of 1.16 mg/dL).   Assessment: [age] yoM admitted on 5/27 with back pain and history of L4 compression fracture.  PMH is significant for chronic warfarin for history of PE, CVA.  Warfarin was resumed on admission, but subsequently held for pending kyphoplasty. Goal INR < 1.6 prior to procedure. Vitamin K 2.5mg  PO x 1 given on 5/30 at 0802 to reverse INR. Pharmacy consulted to start IV heparin IV when INR < 2 --> started on 5/30 at 1738. Heparin stopped at 0859 on 5/31 for procedure. Patient had kyphoplasty on 5/31 (completed at 1629). Heparin drip resumed 2 hrs post procedure Dr. Deanne CofferHassell with IR  and warfarin resumed on 5/31 per Dr. Catha GosselinMikhail  - Home warfarin dose 5 mg Mon/Wed and 2.5 mg all other days. LD 5/27 2200. INR = 2.08 on admission.   6/2 - heparin level is therapeutic at 0.67 - INR is subtherapeutic but up from 1.18 to 1.31 with 7.5 mg dose given yesterday (vit K 2.5 mg x1 on 5/30) - cbc stable - no bleeding documented - no significant drug-drug intxns Today, 6/3 -0412 HL=0.74 slightly above goal, no infusion or bleeding issues per RN -INR=1.93 this am   Goal of  Therapy:  INR 2-3 Heparin level 0.3-0.7 units/ml Monitor platelets by anticoagulation protocol: Yes   Plan:  -Decrease heparin drip to 1150 units/hr - Continue IV heparin infusion until INR therapeutic - Monitor closely for s/sx of bleeding   Lorenza EvangelistGreen, Najiyah Paris R 04/19/2018 5:36 AM

## 2018-04-19 NOTE — Progress Notes (Signed)
PT Cancellation Note  Patient Details Name: Dionne AnoRoger W Haddon MRN: [id number] DOB: 12/04/1950   Cancelled Treatment:    Reason Eval/Treat Not Completed: Attempted PT tx session. Pt declined participation on today. He stated he is planning to d/c home today.    Rebeca AlertJannie Lorn Butcher, MPT Pager: [phone]

## 2018-04-19 NOTE — Discharge Summary (Signed)
Physician Discharge Summary  Dakota Hall:[id number] DOB: November 06, 1951 DOA: 04/12/2018  PCP: Leeroy Cha, MD  Admit date: 04/12/2018 Discharge date: 04/19/2018  Time spent: 45 minutes  Recommendations for Outpatient Follow-up:  Patient will be discharged to skilled nursing facilty. Continue physical and occupational therapy  Patient will need to follow up with primary care provider within one week of discharge.  Repeat INR in 1 day, if therapeutic, discontinue lovenvox.  Follow up with interventional radiology in 2 weeks. Patient should continue medications as prescribed.  Patient should follow a heart healthy/carb modified diet.   Discharge Diagnoses:  Lower back pain with L4 compression fracture History of pulmonary embolism Diabetes mellitus, type II Essential hypertension History of stroke Bipolar disorder CKD, stage III  Discharge Condition: Stable  Diet recommendation: Heart healthy/carb modified   Filed Weights   04/12/18 1952  Weight: 116.6 kg (257 lb)    History of present illness:  On 5/28/219 by Dr. Gean Birchwood Dakota Kaufman Brownis a 67 y.o.malewithhistory of diabetes mellitus type 2, stroke with right-sided hemiplegia, pulmonary embolism, hypertension who was admitted and discharged on Mar 26, 2018 for L5 compression fracture presents to the ER back with worsening pain. Patient states his pain worsened last few days. Pain is worsened on trying to ambulate. Denies any incontinence of urine or bowel.  Hospital Course:  Lower back pain with L4 compression fracture -CT lumbar spine showed mild progression of height loss at L4 compared to 03/24/2018.  Unchanged appearance of L1 compression fracture.  Mild spinal canal narrowing L3-4, L4-5 with mild to moderate bilateral L4-5 foraminal stenosis. -MRI lumbar spine showed acute or subacute compression fractures of L3 and L4 with vertebral body height loss at L3 approximately 40% -Interventional radiology  consulted and appreciated for possible kyphoplasty, currently pending insurance authorization and improvement in INR -PT consulted recommended SNF. Patient agrees -Social work consult -Continue pain control -steroids transitioned to prednisone- will continue taper on discharge -s/p kyphoplasity -patient will need to follow up with IR  History of pulmonary embolism -INR reversed for kyphoplasty with one dose of oral Vit K2.73m  -INR currently 1.93, will discharge with lovenox bridge, with repeat check of INR on 04/20/2018, if INR >2, discontinue lovenox bridge (suspect patient will need bridging for one additional day) -Continue home coumadin regimen  Diabetes mellitus, type II -Continue Actos  Essential hypertension -Continue amlodipine, hydralazine, Avapro, imdur, lasix  History of stroke -With right-sided hemi-plegia -As above, patient on Coumadin which has now been restarted -Continue statin  Bipolar disorder -Stable, continue Abilify and Zoloft  CKD, stage III -Stable  Consultants Interventional radiology  Procedures  IR KYPHO LUMBAR INC FX REDUCE BONE BX UNI/BIL CANNULATION INC/IMAGING    Discharge Exam: Vitals:   04/19/18 0518 04/19/18 0920  BP: (!) 156/83 (!) 164/79  Pulse: (!) 58 63  Resp: 14   Temp: 98.5 F (36.9 C)   SpO2: 92%      General: Well developed, well nourished, NAD, appears stated age  HEENT: NCAT, mucous membranes moist.  Neck: Supple  Cardiovascular: S1 S2 auscultated, no rubs, murmurs or gallops. Regular rate and rhythm.  Respiratory: Clear to auscultation bilaterally with equal chest rise  Abdomen: Soft, nontender, nondistended, + bowel sounds  Extremities: warm dry without cyanosis clubbing or edema  Neuro: AAOx3, chronic R hemiplegia, otherwise nonfocal  Psych: Normal affect and demeanor, pleasant  Discharge Instructions Discharge Instructions    Discharge instructions   Complete by:  As directed    Patient will be  discharged to skilled nursing facilty. Continue physical and occupational therapy  Patient will need to follow up with primary care provider within one week of discharge.  Repeat INR in 1 day, if therapeutic, discontinue lovenvox.  Follow up with interventional radiology in 2 weeks. Patient should continue medications as prescribed.  Patient should follow a heart healthy/carb modified diet.     Allergies as of 04/19/2018      Reactions   Penicillins Hives, Other (See Comments)   Has patient had a PCN reaction causing immediate rash, facial/tongue/throat swelling, SOB or lightheadedness with hypotension: no Has patient had a PCN reaction causing severe rash involving mucus membranes or skin necrosis: no Has patient had a PCN reaction that required hospitalization: no Has patient had a PCN reaction occurring within the last 10 years: no If all of the above answers are "NO", then may proceed with Cephalosporin use.      Medication List    TAKE these medications   acetaminophen 325 MG tablet Commonly known as:  TYLENOL Take 2 tablets (650 mg total) by mouth every 6 (six) hours as needed for mild pain (or Fever >/= 101).   amLODipine 10 MG tablet Commonly known as:  NORVASC Take 10 mg by mouth daily.   ARIPiprazole 5 MG tablet Commonly known as:  ABILIFY Take 2.5 mg by mouth daily.   baclofen 20 MG tablet Commonly known as:  LIORESAL Take 1 tablet (20 mg total) by mouth 3 (three) times daily as needed for muscle spasms. What changed:    when to take this  reasons to take this   BENGAY EX Apply 1 application topically as needed (For pain.).   docusate sodium 100 MG capsule Commonly known as:  COLACE Take 100 mg by mouth every Monday, Wednesday, and Friday.   enoxaparin 120 MG/0.8ML injection Commonly known as:  LOVENOX Inject 0.8 mLs (120 mg total) into the skin 2 (two) times daily.   furosemide 20 MG tablet Commonly known as:  LASIX Take 20 mg by mouth daily.     hydrALAZINE 25 MG tablet Commonly known as:  APRESOLINE Take 25 mg by mouth 2 (two) times daily.   isosorbide mononitrate 30 MG 24 hr tablet Commonly known as:  IMDUR Take 30 mg by mouth daily.   lidocaine 5 % Commonly known as:  LIDODERM Place 1 patch onto the skin daily. Remove & Discard patch within 12 hours or as directed by MD   nystatin powder Commonly known as:  MYCOSTATIN/NYSTOP Apply topically 2 (two) times daily.   olmesartan 5 MG tablet Commonly known as:  BENICAR Take 5 mg by mouth daily.   ondansetron 4 MG tablet Commonly known as:  ZOFRAN Take 4 mg by mouth every 6 (six) hours as needed for nausea or vomiting.   ONE TOUCH ULTRA 2 w/Device Kit   ONE TOUCH ULTRA TEST test strip Generic drug:  glucose blood   ONETOUCH DELICA LANCETS 96V Misc   pioglitazone 15 MG tablet Commonly known as:  ACTOS Take 7.5 mg by mouth daily.   potassium chloride SA 20 MEQ tablet Commonly known as:  K-DUR,KLOR-CON Take 20 mEq by mouth daily.   predniSONE 10 MG tablet Commonly known as:  DELTASONE Day 1 take 5 pills, day 2 take 4 pills, day 3 take 3 pills, day 4 take 2 pills, day 5 take 1 pill.   sertraline 100 MG tablet Commonly known as:  ZOLOFT Take 150 mg by mouth daily.   simvastatin 20 MG  tablet Commonly known as:  ZOCOR Take 1 tablet (20 mg total) by mouth daily at 6 PM.   traMADol 50 MG tablet Commonly known as:  ULTRAM Take 2 tablets (100 mg total) by mouth every 6 (six) hours as needed for moderate pain.   Vitamin D (Ergocalciferol) 50000 units Caps capsule Commonly known as:  DRISDOL Take 50,000 Units by mouth every Friday.   warfarin 2.5 MG tablet Commonly known as:  COUMADIN Take 2.5-5 mg by mouth daily. Take two tablets on Monday and Wednesday and one tablet the rest of the week.      Allergies  Allergen Reactions  . Penicillins Hives and Other (See Comments)    Has patient had a PCN reaction causing immediate rash, facial/tongue/throat  swelling, SOB or lightheadedness with hypotension: no Has patient had a PCN reaction causing severe rash involving mucus membranes or skin necrosis: no Has patient had a PCN reaction that required hospitalization: no Has patient had a PCN reaction occurring within the last 10 years: no If all of the above answers are "NO", then may proceed with Cephalosporin use.     Contact information for follow-up providers    [facility].   Specialty:  Emergency Medicine Why:  Return to ED with new or worsening symptoms or concerns Contact information: [place] [phone] [place]       Leeroy Cha, MD.   Specialty:  Internal Medicine Contact information: [address] [address] [phone]        Newman Pies, MD.   Specialty:  Neurosurgery Contact information: [place]. [address] [place] [address] [phone]        Mcarthur Rossetti, MD.   Specialty:  Orthopedic Surgery Contact information: [place] [address] [phone]        Arne Cleveland, MD. Call.   Specialties:  Interventional Radiology, Radiology Contact information: [address] [place] [address] [phone]            Contact information for after-discharge care    Destination    [facility] .   Service:  Skilled Nursing Contact information: [address] [place] [phone]                   The results of significant diagnostics from this hospitalization (including imaging, microbiology, ancillary and laboratory) are listed below for reference.    Significant Diagnostic Studies: Ct Lumbar Spine Wo Contrast  Result Date: 04/12/2018 CLINICAL DATA:  Worsening low back pain. History of L4 compression fracture. EXAM: CT LUMBAR SPINE  WITHOUT CONTRAST TECHNIQUE: Multidetector CT imaging of the lumbar spine was performed without intravenous contrast administration. Multiplanar CT image reconstructions were also generated. COMPARISON:  CT abdomen pelvis 03/24/2018 FINDINGS: Segmentation: 5 lumbar type vertebrae. Alignment: Normal. Vertebrae: Chronic compression deformities of L1 and L4. There has been progression of L4 height loss compared to 03/24/2018. No new compression fracture. Paraspinal and other soft tissues: Atherosclerotic calcification of the abdominal aorta. Disc levels: Mild narrowing of the spinal canal at L3-L4 and L4-L5. Mild-to-moderate bilateral L4-5 foraminal stenosis. Otherwise, the spinal canal is widely patent. IMPRESSION: 1. Mild progression of height loss at L4 compared to the 03/24/2018 study. Unchanged appearance of L1 compression fracture. 2. Mild spinal canal narrowing at L3-4 and L4-5 with mild-to-moderate bilateral L4-5 foraminal stenosis. 3.  Aortic Atherosclerosis (ICD10-I70.0). Electronically Signed  By: Ulyses Jarred M.D.   On: 04/12/2018 23:00   Mr Lumbar Spine Wo Contrast  Result Date: 04/13/2018 CLINICAL DATA:  Worsening low back pain. The patient suffered an L4 compression fracture in a fall 03/16/2018. EXAM: MRI LUMBAR SPINE WITHOUT CONTRAST TECHNIQUE: Multiplanar, multisequence MR imaging of the lumbar spine was performed. No intravenous contrast was administered. COMPARISON:  CT lumbar spine 04/12/2018. CT abdomen and pelvis 03/24/2018 and 05/13/2016. FINDINGS: Segmentation:  Standard. Alignment:  Maintained. Vertebrae: Remote L1 compression fracture is identified as seen on all of the prior examinations. The patient also has a remote L2 compression fracture which is seen on the 05/13/2016 CT. There are acute or subacute compression fractures of L3 and L4 with marrow edema in both vertebral bodies. Vertebral body height loss at L3 is approximately 40%. There is near vertebra plana deformity of the  central aspect of L4. Vertebral body height loss has not progressed at either level since the most recent CT scan. Marrow edema in both vertebral bodies is more extensive in L4. No other fracture is identified. Marrow signal is otherwise unremarkable. Conus medullaris and cauda equina: Conus extends to the L1 level. Conus and cauda equina appear normal. Paraspinal and other soft tissues: Small left renal cyst is noted in seen on prior exams. Disc levels: T11-12 is imaged in the sagittal plane only and negative. T12-L1: No bony retropulsion off L1. No disc bulge or protrusion. The central canal and foramina are open. L1-2: Minimal disc bulge without stenosis. L2-3: Negative. L3-4: Shallow disc bulge. Also seen is bony retropulsion off the posterior margin of L4 resulting in mild to moderate central canal stenosis in conjunction with ligamentum flavum thickening. The foramina are open. L4-5: Minimal disc bulge and mild facet degenerative disease without stenosis. L5-S1: Epidural fat is prominent. Mild facet arthropathy. Minimal disc bulge without stenosis. IMPRESSION: The examination is positive for acute or subacute compression fractures of L3 and L4 with vertebral body height loss at L3 of approximately 40% and near vertebra plana deformity centrally of the central aspect L4, unchanged since the most recent exam. Bony retropulsion off the posterior aspect of L4 causes mild to moderate central canal stenosis. There is no retropulsion at L3. Remote L1 and L2 compression fractures. Mild degenerative disc disease as described above. Electronically Signed   By: Inge Rise M.D.   On: 04/13/2018 15:03   Ct Abdomen Pelvis W Contrast  Result Date: 03/24/2018 CLINICAL DATA:  Initial evaluation for low back pain radiating down both legs. History of minor trauma. EXAM: CT ABDOMEN AND PELVIS WITH CONTRAST TECHNIQUE: Multidetector CT imaging of the abdomen and pelvis was performed using the standard protocol following  bolus administration of intravenous contrast. CONTRAST:  142m ISOVUE-300 IOPAMIDOL (ISOVUE-300) INJECTION 61% COMPARISON:  Prior CT from 05/13/2016. FINDINGS: Lower chest: Mild scattered bibasilar atelectatic changes. Small nodular density along the right minor fissure felt to be most consistent with a intra pulmonic lymph node. Visualized lungs are otherwise clear. Hepatobiliary: Liver demonstrates a normal contrast enhanced appearance. Gallbladder within normal limits. No biliary dilatation. Pancreas: Fatty infiltration the pancreas noted. Pancreas otherwise unremarkable without acute inflammatory changes. Spleen: Spleen intact and within normal limits. Adrenals/Urinary Tract: Adrenal glands are normal. Kidneys equal in size with symmetric enhancement. Kidneys demonstrated lobulated contour bilaterally. Few scattered subcentimeter hypodensities too small the characterize, but statistically likely reflects small cyst. No nephrolithiasis, hydronephrosis, or focal enhancing renal mass. No hydroureter. Partially distended bladder within normal limits. Stomach/Bowel: Stomach within normal limits. Punctate calcification  at the GE junction noted, of doubtful significance. No evidence for bowel obstruction. Appendix is surgically absent. No acute inflammatory changes seen about the bowels. Vascular/Lymphatic: Normal intravascular enhancement seen throughout the intra-abdominal aorta. No aneurysm. Moderate aorto bi-iliac atherosclerotic disease. Mesenteric vessels are patent proximally. Mildly prominent porta hepatis lymph nodes measure up to 11 mm. 15 mm right inguinal node measures at the upper limits of normal. No other adenopathy within the abdomen and pelvis. Reproductive: Prostate within normal limits. Other: No free intraperitoneal air. No free fluid. No mesenteric or retroperitoneal hematoma. Musculoskeletal: Visualized external soft tissues demonstrate no acute abnormality. Chronic compression deformities  involving the L1 and L2 vertebral bodies are stable from previous. There is a new compression deformity involving the L4 vertebral body, which appears at least partially acute at the inferior endplate. Associated height loss of up to approximately 60% with 5 mm bony retropulsion. IMPRESSION: 1. Probable acute compression fracture involving the L4 vertebral body with up to 60% height loss and 5 mm bony retropulsion. Correlation with physical exam for possible pain at this location recommended. 2. No other acute traumatic injury within the abdomen and pelvis. Electronically Signed   By: Jeannine Boga M.D.   On: 03/24/2018 05:02   Ir Kypho Lumbar Inc Fx Reduce Bone Bx Uni/bil Cannulation Inc/imaging  Result Date: 04/16/2018 CLINICAL DATA:  Painful subacute lumbar L3 and L4 compression fracture deformities. EXAM: KYPHOPLASTY AT LUMBAR L3 KYPHOPLASTY AT LUMBAR L4 TECHNIQUE: The procedure, risks (including but not limited to bleeding, infection, organ damage), benefits, and alternatives were explained to the patient. Questions regarding the procedure were encouraged and answered. The patient understands and consents to the procedure. The patient was placed prone on the fluoroscopic table. The skin overlying the LUMBAR region was then prepped and draped in the usual sterile fashion. Maximal barrier sterile technique was utilized including caps, mask, sterile gowns, sterile gloves, sterile drape, hand hygiene and skin antiseptic. Intravenous Fentanyl and Versed were administered as conscious sedation during continuous cardiorespiratory monitoring by the radiology RN, with a total moderate sedation time of 42 minutes. As antibiotic prophylaxis, VANCOMYCIN 1.5 G was ordered pre-procedure and administered intravenously within !one hour! of incision. The right pedicle at LUMBAR L4 was then infiltrated with 1% lidocaine followed by the advancement of a Kyphon trocar needle through the right pedicle into the posterior  one-third. The trocar was removed and the osteo drill was advanced to the anterior third of the vertebral body. The osteo drill was retracted. Through the working cannula, a Kyphon inflatable bone tamp 15 x 2 was advanced and positioned with the distal marker 5 mm from the anterior aspect of the cortex. Crossing of the midline was not seen on the AP projection. At this time, the balloon was expanded using contrast via a Kyphon inflation syringe device via micro tubing. In similar fashion, the left pedicle was infiltrated with 1% lidocaine followed by the advancement of a second Kyphon trocar needle through the left pedicle into the posterior third of the vertebral body. The osteo drill was coaxially advanced to the anterior right third. The osteo drill was exchanged for a Kyphon inflatable bone tamp 15 x 2, advanced to the 5 mm of the anterior aspect of the cortex. The balloon was then expanded using contrast as above. Inflations were continued until there was near apposition across the midline and with the superior and the inferior end plates. In similar fashion at lumbar L3, was then infiltrated with 1% lidocaine followed by the advancement  of a Kyphon trocar needle through the right pedicle into the posterior one-third. The trocar was removed and the osteo drill was advanced to the anterior third of the vertebral body. The osteo drill was retracted. Through the working cannula, a Kyphon inflatable bone tamp 15 x 2 was advanced and positioned with the distal marker 5 mm from the anterior aspect of the cortex. Crossing of the midline was seen on the AP projection. At this time, the balloon was expanded using contrast via a Kyphon inflation syringe device via micro tubing. At this time, methylmethacrylate mixture was reconstituted in the Kyphon bone mixing device system. This was then loaded into the delivery mechanism, attached to Kyphon bone fillers. The balloons were deflated and removed followed by the  instillation of methylmethacrylate mixture with excellent filling in the AP and lateral projections at both levels. No extravasation was noted in the disk spaces or posteriorly into the spinal canal. No epidural venous contamination was seen. The patient tolerated the procedure well. There were no acute complications. The working cannulae and the bone filler were then retrieved and removed. COMPLICATIONS: COMPLICATIONS None immediate. IMPRESSION: 1. Status post vertebral body augmentation using balloon kyphoplasty at lumbar L3 as described without event. 2. Status post vertebral body augmentation using balloon kyphoplasty at lumbar L4 as described without event. 3. Per CMS PQRS reporting requirements (PQRS Measure 24): Given the patient's age of greater than 81 and the fracture site (hip, distal radius, or spine), the patient should be tested for osteoporosis using DXA, and the appropriate treatment considered based on the DXA results. Electronically Signed   By: Lucrezia Europe M.D.   On: 04/16/2018 16:56   Ir Kypho Ea Addl Level Thoracic Or Lumbar  Result Date: 04/16/2018 CLINICAL DATA:  Painful subacute lumbar L3 and L4 compression fracture deformities. EXAM: KYPHOPLASTY AT LUMBAR L3 KYPHOPLASTY AT LUMBAR L4 TECHNIQUE: The procedure, risks (including but not limited to bleeding, infection, organ damage), benefits, and alternatives were explained to the patient. Questions regarding the procedure were encouraged and answered. The patient understands and consents to the procedure. The patient was placed prone on the fluoroscopic table. The skin overlying the LUMBAR region was then prepped and draped in the usual sterile fashion. Maximal barrier sterile technique was utilized including caps, mask, sterile gowns, sterile gloves, sterile drape, hand hygiene and skin antiseptic. Intravenous Fentanyl and Versed were administered as conscious sedation during continuous cardiorespiratory monitoring by the radiology RN, with  a total moderate sedation time of 42 minutes. As antibiotic prophylaxis, VANCOMYCIN 1.5 G was ordered pre-procedure and administered intravenously within !one hour! of incision. The right pedicle at LUMBAR L4 was then infiltrated with 1% lidocaine followed by the advancement of a Kyphon trocar needle through the right pedicle into the posterior one-third. The trocar was removed and the osteo drill was advanced to the anterior third of the vertebral body. The osteo drill was retracted. Through the working cannula, a Kyphon inflatable bone tamp 15 x 2 was advanced and positioned with the distal marker 5 mm from the anterior aspect of the cortex. Crossing of the midline was not seen on the AP projection. At this time, the balloon was expanded using contrast via a Kyphon inflation syringe device via micro tubing. In similar fashion, the left pedicle was infiltrated with 1% lidocaine followed by the advancement of a second Kyphon trocar needle through the left pedicle into the posterior third of the vertebral body. The osteo drill was coaxially advanced to the anterior right third.  The osteo drill was exchanged for a Kyphon inflatable bone tamp 15 x 2, advanced to the 5 mm of the anterior aspect of the cortex. The balloon was then expanded using contrast as above. Inflations were continued until there was near apposition across the midline and with the superior and the inferior end plates. In similar fashion at lumbar L3, was then infiltrated with 1% lidocaine followed by the advancement of a Kyphon trocar needle through the right pedicle into the posterior one-third. The trocar was removed and the osteo drill was advanced to the anterior third of the vertebral body. The osteo drill was retracted. Through the working cannula, a Kyphon inflatable bone tamp 15 x 2 was advanced and positioned with the distal marker 5 mm from the anterior aspect of the cortex. Crossing of the midline was seen on the AP projection. At this  time, the balloon was expanded using contrast via a Kyphon inflation syringe device via micro tubing. At this time, methylmethacrylate mixture was reconstituted in the Kyphon bone mixing device system. This was then loaded into the delivery mechanism, attached to Kyphon bone fillers. The balloons were deflated and removed followed by the instillation of methylmethacrylate mixture with excellent filling in the AP and lateral projections at both levels. No extravasation was noted in the disk spaces or posteriorly into the spinal canal. No epidural venous contamination was seen. The patient tolerated the procedure well. There were no acute complications. The working cannulae and the bone filler were then retrieved and removed. COMPLICATIONS: COMPLICATIONS None immediate. IMPRESSION: 1. Status post vertebral body augmentation using balloon kyphoplasty at lumbar L3 as described without event. 2. Status post vertebral body augmentation using balloon kyphoplasty at lumbar L4 as described without event. 3. Per CMS PQRS reporting requirements (PQRS Measure 24): Given the patient's age of greater than 37 and the fracture site (hip, distal radius, or spine), the patient should be tested for osteoporosis using DXA, and the appropriate treatment considered based on the DXA results. Electronically Signed   By: Lucrezia Europe M.D.   On: 04/16/2018 16:56   Dg Hips Bilat With Pelvis 2v  Result Date: 04/13/2018 CLINICAL DATA:  Worsening hip pain EXAM: DG HIP (WITH OR WITHOUT PELVIS) 2V BILAT COMPARISON:  None. FINDINGS: Mild spurring in the hip joints bilaterally. No fracture, subluxation or dislocation. SI joints are symmetric and unremarkable. 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 No acute bony abnormality. Electronically Signed   By: Rolm Baptise M.D.   On: 04/13/2018 11:47    Microbiology: Recent Results (from the past 240 hour(s))  MRSA PCR Screening     Status: None   Collection Time: 04/16/18   5:22 AM  Result Value Ref Range Status   MRSA by PCR NEGATIVE NEGATIVE Final    Comment:        The GeneXpert MRSA Assay (FDA approved for NASAL specimens only), is one component of a comprehensive MRSA colonization surveillance program. It is not intended to diagnose MRSA infection nor to guide or monitor treatment for MRSA infections. Performed at [facility], [place] [address] [place], [place] [address]      Labs: Basic Metabolic Panel: Recent Labs  Lab 04/12/18 2122 04/13/18 0501 04/16/18 0006 04/19/18 0412  NA 144 145 140 138  K 4.4 4.3 3.8 3.6  CL 108 108 106 107  CO2 '24 24 27 22  ' GLUCOSE 111* 117* 161* 136*  BUN 21* 21* 32* 30*  CREATININE 1.33* 1.31* 1.20 1.16  CALCIUM 9.7 9.2 8.8* 8.5*   Liver Function Tests: No results for input(s): AST, ALT, ALKPHOS, BILITOT, PROT, ALBUMIN in the last 168 hours. No results for input(s): LIPASE, AMYLASE in the last 168 hours. No results for input(s): AMMONIA in the last 168 hours. CBC: Recent Labs  Lab 04/15/18 0450 04/16/18 0006 04/17/18 0108 04/18/18 0358 04/19/18 0412  WBC 10.4 10.4 12.4* 9.2 12.1*  HGB 14.6 14.7 14.8 15.4 15.2  HCT 42.3 42.5 42.7 43.8 42.9  MCV 86.9 86.6 85.7 85.0 84.4  PLT 184 209 202 181 177   Cardiac Enzymes: No results for input(s): CKTOTAL, CKMB, CKMBINDEX, TROPONINI in the last 168 hours. BNP: BNP (last 3 results) No results for input(s): BNP in the last 8760 hours.  ProBNP (last 3 results) No results for input(s): PROBNP in the last 8760 hours.  CBG: Recent Labs  Lab 04/18/18 0817 04/18/18 1217 04/18/18 2119 04/19/18 0717 04/19/18 1129  GLUCAP 176* 155* 203* 112* 207*       Signed:  Jacquese Cassarino  [facility] 04/19/2018, 11:36 AM

## 2018-04-19 NOTE — Clinical Social Work Placement (Addendum)
[facility] arranged for transport.  Nurse given the number to call report.   CLINICAL SOCIAL WORK PLACEMENT  NOTE  Date:  04/19/2018  Patient Details  Name: Dakota Hall MRN: [id number] Date of Birth: 03/03/1951  Clinical Social Work is seeking post-discharge placement for this patient at the Skilled  Nursing Facility level of care (*CSW will initial, date and re-position this form in  chart as items are completed):  Yes   Patient/family provided with [facility] Clinical Social Work Department's list of facilities offering this level of care within the geographic area requested by the patient (or if unable, by the patient's family).  Yes   Patient/family informed of their freedom to choose among providers that offer the needed level of care, that participate in Medicare, Medicaid or managed care program needed by the patient, have an available bed and are willing to accept the patient.  Yes   Patient/family informed of [facility] ownership interest [facility] and [facility], as well as of the fact that they are under no obligation to receive care at these facilities.  PASRR submitted to EDS on       PASRR number received on       Existing PASRR number confirmed on 05/14/18     FL2 transmitted to all facilities in geographic area requested by pt/family on       FL2 transmitted to all facilities within larger geographic area on 05/14/18     Patient informed that his/her managed care company has contracts with or will negotiate with certain facilities, including the following:  [facility]     Yes   Patient/family informed of bed offers received.  [facility]     Physician recommends and patient chooses bed at      [facility]19/18.  Patient to be transferred to facility by [facility]      Patient family notified on 04/19/18 of transfer.  Name of family member notified:   Spouse     PHYSICIAN Please prepare priority discharge summary, including medications     Additional Comment:    _______________________________________________ Clearance CootsNicole A Markeem Noreen, LCSW 04/19/2018, 10:46 AM

## 2018-04-20 ENCOUNTER — Other Ambulatory Visit (HOSPITAL_COMMUNITY): Payer: Self-pay | Admitting: Interventional Radiology

## 2018-04-20 ENCOUNTER — Encounter: Payer: Self-pay | Admitting: Internal Medicine

## 2018-04-20 ENCOUNTER — Non-Acute Institutional Stay (SKILLED_NURSING_FACILITY): Payer: Medicare HMO | Admitting: Internal Medicine

## 2018-04-20 DIAGNOSIS — Z86711 Personal history of pulmonary embolism: Secondary | ICD-10-CM

## 2018-04-20 DIAGNOSIS — Z8673 Personal history of transient ischemic attack (TIA), and cerebral infarction without residual deficits: Secondary | ICD-10-CM | POA: Diagnosis not present

## 2018-04-20 DIAGNOSIS — S32040D Wedge compression fracture of fourth lumbar vertebra, subsequent encounter for fracture with routine healing: Secondary | ICD-10-CM

## 2018-04-20 DIAGNOSIS — S32010A Wedge compression fracture of first lumbar vertebra, initial encounter for closed fracture: Secondary | ICD-10-CM

## 2018-04-20 NOTE — Assessment & Plan Note (Signed)
PT/OT at SNF °

## 2018-04-20 NOTE — Progress Notes (Signed)
NURSING HOME LOCATION:  [facility] ROOM NUMBER:  [id number]  CODE STATUS:  DNR  PCP:  Lorenda Ishihara, MD  [address] [place] [address]  This is a comprehensive admission note to [facility] performed on this date less than 30 days from date of admission. Included are preadmission medical/surgical history; reconciled medication list; family history; social history and comprehensive review of systems.  Corrections and additions to the records were documented. Comprehensive physical exam was also performed. Additionally a clinical summary was entered for each active diagnosis pertinent to this admission in the Problem List to enhance continuity of care.  HPI:  He had been hospitalized 5/7-5/10/19 for L1 & L4 compression fractures for which pain meds & brace were Rxed.   The patient states that the original fall was related to him being unbalanced upon getting up out of bed resulting in a fall and the vertebral fractures. He denies any cardiac or neurologic prodrome prior to the fall. He was re-admitted 5/27-6/3 for progressive pain over several days with CT evidence of progression of L4 fracture.L1 compression fracture was unchanged. Associated findings included mild spinal canal narrowing @ L3-4 and L4-5 with mild-moderate bilateral  L4-5 foraminal stenosis. MRI of the lumbar spine showed acute or subacute compression fractures of L3 and L4 with approximate 40% vertebral body height loss at L3. Interventional radiology recommended possible kyphoplasty. He is on warfarin because of history pulmonary emboli. INR was reversed with 1 dose of oral vitamin K 2.5 milligram allowing kyphoplasty 04/16/18. Postprocedure INR was subtherapeutic @ 1.93 and he was discharged on Lovenox bridge. PT/INR was to be repeated 6/4.Lovenox to be discontinued once the INR is greater than 2.  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 He states that his stroke was in the context of noncompliance with his medication regimen. There is also history of essential hypertension, diabetes, bipolar disorder, anxiety/depression, CKD stage III,and obstructive sleep apnea (on CPAP). He also had an anal fissure repair.  Social history: Nondrinker, never smoked.  Family history: Reviewed   Review of systems:Pain control is adequate. He describes occasional dysphagia. He also has constipation. He is actively seeing a psychiatrist for his anxiety/depression.   Constitutional: No fever, significant weight change, fatigue  Eyes: No redness, discharge, pain, vision change ENT/mouth: No nasal congestion, purulent discharge, earache, change in hearing, sore throat  Cardiovascular: No chest pain, palpitations, paroxysmal nocturnal dyspnea, claudication, edema  Respiratory: No cough, sputum production, hemoptysis, DOE, significant snoring, apnea Gastrointestinal: No heartburn, dysphagia, abdominal pain, nausea /vomiting, rectal bleeding, melena Genitourinary: No dysuria, hematuria, pyuria, incontinence, nocturia Dermatologic: No rash, pruritus, change in appearance of skin Neurologic: No dizziness, headache, syncope, seizures Psychiatric: No insomnia, anorexia Endocrine: No change in hair/skin/nails, excessive thirst, excessive hunger, excessive urination  Hematologic/lymphatic: No significant bruising, lymphadenopathy, abnormal bleeding Allergy/immunology: No itchy/watery eyes, significant sneezing, urticaria, angioedema  Physical exam:  Pertinent or positive findings: Alopecia is present. He has a few upper and lower teeth which are in disrepair. Voice somewhat weak & intermittently "breaking" in character. His left side exhibits good strength in the upper & lower extremity but he has right hemiparesis. Stasis dermatitis and hyperpigmentation over the right shin. The pedal pulses are  surprisingly good.  General appearance: Adequately nourished; no acute distress, increased work of breathing is present.   Lymphatic: No lymphadenopathy about the head, neck, axilla.  Eyes: No conjunctival inflammation or lid edema is present. There is no scleral icterus. Ears:  External ear exam shows no significant lesions or deformities.   Nose:  External nasal examination shows no deformity or inflammation. Nasal mucosa are pink and moist without lesions, exudates Oral exam: Lips and gums are healthy appearing.There is no oropharyngeal erythema or exudate. Neck:  No thyromegaly, masses, tenderness noted.    Heart:  Normal rate and regular rhythm. S1 and S2 normal without gallop, murmur, click, rub.  Lungs: Chest clear to auscultation without wheezes, rhonchi, rales, rubs. Abdomen: Bowel sounds are normal.  Abdomen is soft and nontender with no organomegaly, hernias, masses. GU: Deferred  Extremities:  No cyanosis, clubbing, edema. Neurologic exam: Balance, Rhomberg, finger to nose testing could not be completed due to clinical state Skin: Warm & dry w/o tenting. No significant  rash.  See clinical summary under each active problem in the Problem List with associated updated therapeutic plan

## 2018-04-20 NOTE — Assessment & Plan Note (Signed)
Resid[facility]aresis [facility]

## 2018-04-21 NOTE — Assessment & Plan Note (Signed)
D/C Lovenox when PT/INR > 2

## 2018-04-21 NOTE — Patient Instructions (Signed)
See assessment and plan under each diagnosis in the problem list and acutely for this visit 

## 2018-04-23 LAB — POCT INR: INR: 2.6 — AB (ref 0.9–1.1)

## 2018-04-28 LAB — POCT INR: INR: 1.9 — AB (ref 0.9–1.1)

## 2018-05-04 ENCOUNTER — Ambulatory Visit
Admission: RE | Admit: 2018-05-04 | Discharge: 2018-05-04 | Disposition: A | Discharge status: Home / Self Care | Payer: Medicare HMO | Source: Ambulatory Visit | Attending: Interventional Radiology | Admitting: Interventional Radiology

## 2018-05-04 ENCOUNTER — Encounter: Payer: Self-pay | Admitting: *Deleted

## 2018-05-04 DIAGNOSIS — Z9889 Other specified postprocedural states: Secondary | ICD-10-CM | POA: Diagnosis not present

## 2018-05-04 DIAGNOSIS — S32030A Wedge compression fracture of third lumbar vertebra, initial encounter for closed fracture: Secondary | ICD-10-CM | POA: Diagnosis not present

## 2018-05-04 DIAGNOSIS — S32040A Wedge compression fracture of fourth lumbar vertebra, initial encounter for closed fracture: Secondary | ICD-10-CM | POA: Diagnosis not present

## 2018-05-04 DIAGNOSIS — S32010A Wedge compression fracture of first lumbar vertebra, initial encounter for closed fracture: Secondary | ICD-10-CM

## 2018-05-04 HISTORY — PX: IR RADIOLOGIST EVAL & MGMT: IMG5224

## 2018-05-04 NOTE — Progress Notes (Signed)
Patient ID: Dakota Hall, male   DOB: 10/15/1951, 67 y.o.   MRN: [id number]       Chief Complaint: Patient was seen in consultation today for  Chief Complaint  Patient presents with  . Follow-up    3 wk follow up Kyphoplasty L3/L4   at the request of Abdul Beirne  Referring Physician(s): Laural Eiland  History of Present Illness: Dakota Hall is a 67 y.o. male  with multiple medical problems including hx of CVA with residual right sided hemiplegia. He is still somewhat independent and able to do most ADLs. We were consulted for kyphoplasty treatment of lumbar compression fracture on 04/14/2018 when the patient was an inpatient at [facility].  A couple weeks prior to admission he was trying to get up from the bed and felt a sudden sharp pain in his lower back. He was brought in and found to have an acute L4 compression fracture with about 60% height loss. The decision was initially made to treat conservatively. Over the following couple weeks, his pain  worsened in his lower back, reaching as high as 8-9/10.  No pain into LE. No bowel or bladder control issues. He was then admitted due to intractable back pain. Further workup including an MRI demonstrated an acute L3 fracture as well as the L4 fracture, progressed even further since prior imaging.  We proceeded with lumbar kyphoplasty of lumbar L3 and L4 on 3/81/0175, without complication.  Patient has a prior hx of PE in 2015 and has been placed on lifelong anticoagulation with Coumadin.     Since the procedure, he has been recovering at [facility].  He states that his low back pain has essentially resolved.  About a week ago he did start having left flank and hip pain.  He describes this as relatively constant, exacerbated by sitting or standing, but present when lying flat.  He is using baclofen, ibuprofen, and tramadol as needed at the facility for pain control, with some relief of symptoms.  No new lower  extremity symptoms.  No new bowel or bladder control symptoms. Past Medical History:  Diagnosis Date  . 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 Back spasm   . Chronic kidney disease (CKD), stage III (moderate) (HCC)   . Depression with anxiety   . Diabetes mellitus   . Dysarthria   . Dyslipidemia   . Hypertension   . Obesity   . OSA on CPAP   . Pulmonary emboli ([place]) 02/2014  . Stasis edema of both lower extremities   . Stroke [facility]) July 2013   left paramedian pontine, incidental right parietal subcortical. both d/t small vessel disease  . Stroke [facility]) March 2015   left basal ganglia secondary to small vessel disease    Past Surgical History:  Procedure Laterality Date  . ANAL FISSURE REPAIR    . IR KYPHO EA ADDL LEVEL THORACIC OR LUMBAR  04/16/2018  . IR KYPHO LUMBAR INC FX REDUCE BONE BX UNI/BIL CANNULATION INC/IMAGING  04/16/2018  . TONSILLECTOMY      Allergies: Penicillins  Medications: Prior to Admission medications   Medication Sig Start Date End Date Taking? Authorizing Provider  acetaminophen (TYLENOL) 325 MG tablet Take 2 tablets (650 mg total) by mouth every 6 (six) hours as needed for mild pain (or Fever >/= 101). 04/19/18  Yes Mikhail, Maryann, DO  amLODipine (NORVASC) 10 MG tablet Take 10 mg by mouth daily.    Yes [provider]  ARIPiprazole (ABILIFY) 5 MG tablet Take 2.5 mg by mouth daily. Take 1/2 tablet to = 2.5 mg 03/22/18  Yes [provider]  baclofen (LIORESAL) 20 MG tablet Take 1 tablet (20 mg total) by mouth 3 (three) times daily as needed for muscle spasms. 04/19/18  Yes Mikhail, Clinical biochemist, DO  Blood Glucose Monitoring Suppl (ONE TOUCH ULTRA 2) W/DEVICE KIT  05/14/14  Yes [provider]  furosemide (LASIX) 20 MG tablet Take 20 mg by mouth daily.  03/11/18  Yes [provider]  hydrALAZINE (APRESOLINE) 25 MG tablet Take 25 mg by mouth 2 (two) times daily.  01/24/18  Yes [provider]    isosorbide mononitrate (IMDUR) 30 MG 24 hr tablet Take 30 mg by mouth daily.  01/31/18  Yes [provider]  lidocaine (LIDODERM) 5 % Place 1 patch onto the skin daily. Remove & Discard patch within 12 hours or as directed by MD 04/12/18  Yes Jacqlyn Larsen, PA-C  Menthol, Topical Analgesic, (BIOFREEZE) 4 % GEL Apply 1 application topically 2 (two) times daily as needed (Pain). Apply to joints   Yes [provider]  nystatin (MYCOSTATIN/NYSTOP) powder Apply topically 2 (two) times daily. 04/19/18  Yes Mikhail, Maryann, DO  olmesartan (BENICAR) 5 MG tablet Take 5 mg by mouth daily. 02/24/18  Yes [provider]  ondansetron (ZOFRAN) 4 MG tablet Take 4 mg by mouth every 6 (six) hours as needed for nausea or vomiting.  12/22/17  Yes [provider]  ONE TOUCH ULTRA TEST test strip  05/14/14  Yes [provider]  Jonetta Speak LANCETS 28Z [place]  05/14/14  Yes [provider]  pioglitazone (ACTOS) 15 MG tablet Take 7.5 mg by mouth daily.    Yes [provider]  potassium chloride SA (K-DUR,KLOR-CON) 20 MEQ tablet Take 20 mEq by mouth daily. 02/16/18  Yes [provider]  sertraline (ZOLOFT) 100 MG tablet Take 150 mg by mouth daily.    Yes [provider]  simvastatin (ZOCOR) 20 MG tablet Take 1 tablet (20 mg total) by mouth daily at 6 PM. 05/18/12  Yes Rizwan, Eunice Blase, MD  traMADol (ULTRAM) 50 MG tablet Take 2 tablets (100 mg total) by mouth every 6 (six) hours as needed for moderate pain. 04/19/18  Yes Mikhail, Velta Addison, DO  Vitamin D, Ergocalciferol, (DRISDOL) 50000 UNITS CAPS capsule Take 50,000 Units by mouth every Friday.  06/26/13  Yes [provider]  warfarin (COUMADIN) 2.5 MG tablet Take 2.5-5 mg by mouth daily. Take two tablets on Monday and Wednesday and one tablet the rest of the week.   Yes [provider]  enoxaparin (LOVENOX) 120 MG/0.8ML injection Inject 0.8 mLs (120 mg total) into the skin 2 (two) times  daily. Patient not taking: Reported on 05/04/2018 04/19/18   Cristal Ford, DO  predniSONE (DELTASONE) 10 MG tablet Day 1 take 5 pills, day 2 take 4 pills, day 3 take 3 pills, day 4 take 2 pills, day 5 take 1 pill. Patient not taking: Reported on 05/04/2018 04/19/18   Cristal Ford, DO     Family History  Problem Relation Age of Onset  . Cancer Mother   . Heart disease Father     Social History   Socioeconomic History  . Marital status: Married    Spouse name: Mickel Baas   . Number of children: 2  . Years of education: College   . Highest education level: Not on file  Occupational History  . Occupation: disabled  Employer: [employer]  . Occupation: PSR    Employer: [employer]  Social Needs  . Financial resource strain: Not on file  . Food insecurity:    Worry: Not on file    Inability: Not on file  . Transportation needs:    Medical: Not on file    Non-medical: Not on file  Tobacco Use  . Smoking status: Never Smoker  . Smokeless tobacco: Never Used  Substance and Sexual Activity  . Alcohol use: No    Alcohol/week: 0.0 oz  . Drug use: No  . Sexual activity: Not on file  Lifestyle  . Physical activity:    Days per week: Not on file    Minutes per session: Not on file  . Stress: Not on file  Relationships  . Social connections:    Talks on phone: Not on file    Gets together: Not on file    Attends religious service: Not on file    Active member of club or organization: Not on file    Attends meetings of clubs or organizations: Not on file    Relationship status: Not on file  Other Topics Concern  . Not on file  Social History Narrative   Patient lives at home with wife Mickel Baas.    Patient has 2 children.    Patient is currently working.    Patient has a Degree.     ECOG Status: 2 - Symptomatic, <50% confined to bed  Review of Systems: A 12 point ROS discussed and pertinent positives are indicated in the HPI above.  All other systems are  negative.  Review of Systems  Vital Signs: BP 134/67   Pulse 64   Temp 98.1 F (36.7 C) (Oral)   Resp 14   Ht 5' 7" (1.702 m)   Wt 231 lb (104.8 kg)   SpO2 96%   BMI 36.18 kg/m   Physical Exam  Constitutional: Examined in wheelchair.  Oriented to person, place, and time. Well-developed and well-nourished. No distress.  HENT:  Head: Normocephalic and atraumatic.  Eyes: Conjunctivae and EOM are normal. Right eye exhibits no discharge. Left eye exhibits no discharge. No scleral icterus.  Neck: No JVD present.  Pulmonary/Chest: Effort normal. No stridor. No respiratory distress.  Abdomen: soft, non distended Neurological:  alert and oriented to person, place, and time.  Skin: Skin is warm and dry.  not diaphoretic.  Skin entry sites from kyphoplasty well-healed.  Psychiatric:   normal mood and affect.   behavior is normal. Judgment and thought content normal.    Imaging: Ct Lumbar Spine Wo Contrast  Result Date: 04/12/2018 CLINICAL DATA:  Worsening low back pain. History of L4 compression fracture. EXAM: CT LUMBAR SPINE WITHOUT CONTRAST TECHNIQUE: Multidetector CT imaging of the lumbar spine was performed without intravenous contrast administration. Multiplanar CT image reconstructions were also generated. COMPARISON:  CT abdomen pelvis 03/24/2018 FINDINGS: Segmentation: 5 lumbar type vertebrae. Alignment: Normal. Vertebrae: Chronic compression deformities of L1 and L4. There has been progression of L4 height loss compared to 03/24/2018. No new compression fracture. Paraspinal and other soft tissues: Atherosclerotic calcification of the abdominal aorta. Disc levels: Mild narrowing of the spinal canal at L3-L4 and L4-L5. Mild-to-moderate bilateral L4-5 foraminal stenosis. Otherwise, the spinal canal is widely patent. IMPRESSION: 1. Mild progression of height loss at L4 compared to the 03/24/2018 study. Unchanged appearance of L1 compression fracture. 2. Mild spinal canal narrowing at L3-4  and L4-5 with mild-to-moderate bilateral L4-5 foraminal stenosis.  3.  Aortic Atherosclerosis (ICD10-I70.0). Electronically Signed   By: Ulyses Jarred M.D.   On: 04/12/2018 23:00   Mr Lumbar Spine Wo Contrast  Result Date: 04/13/2018 CLINICAL DATA:  Worsening low back pain. The patient suffered an L4 compression fracture in a fall 03/16/2018. EXAM: MRI LUMBAR SPINE WITHOUT CONTRAST TECHNIQUE: Multiplanar, multisequence MR imaging of the lumbar spine was performed. No intravenous contrast was administered. COMPARISON:  CT lumbar spine 04/12/2018. CT abdomen and pelvis 03/24/2018 and 05/13/2016. FINDINGS: Segmentation:  Standard. Alignment:  Maintained. Vertebrae: Remote L1 compression fracture is identified as seen on all of the prior examinations. The patient also has a remote L2 compression fracture which is seen on the 05/13/2016 CT. There are acute or subacute compression fractures of L3 and L4 with marrow edema in both vertebral bodies. Vertebral body height loss at L3 is approximately 40%. There is near vertebra plana deformity of the central aspect of L4. Vertebral body height loss has not progressed at either level since the most recent CT scan. Marrow edema in both vertebral bodies is more extensive in L4. No other fracture is identified. Marrow signal is otherwise unremarkable. Conus medullaris and cauda equina: Conus extends to the L1 level. Conus and cauda equina appear normal. Paraspinal and other soft tissues: Small left renal cyst is noted in seen on prior exams. Disc levels: T11-12 is imaged in the sagittal plane only and negative. T12-L1: No bony retropulsion off L1. No disc bulge or protrusion. The central canal and foramina are open. L1-2: Minimal disc bulge without stenosis. L2-3: Negative. L3-4: Shallow disc bulge. Also seen is bony retropulsion off the posterior margin of L4 resulting in mild to moderate central canal stenosis in conjunction with ligamentum flavum thickening. The foramina are  open. L4-5: Minimal disc bulge and mild facet degenerative disease without stenosis. L5-S1: Epidural fat is prominent. Mild facet arthropathy. Minimal disc bulge without stenosis. IMPRESSION: The examination is positive for acute or subacute compression fractures of L3 and L4 with vertebral body height loss at L3 of approximately 40% and near vertebra plana deformity centrally of the central aspect L4, unchanged since the most recent exam. Bony retropulsion off the posterior aspect of L4 causes mild to moderate central canal stenosis. There is no retropulsion at L3. Remote L1 and L2 compression fractures. Mild degenerative disc disease as described above. Electronically Signed   By: Inge Rise M.D.   On: 04/13/2018 15:03   Ir Kypho Lumbar Inc Fx Reduce Bone Bx Uni/bil Cannulation Inc/imaging  Result Date: 04/16/2018 CLINICAL DATA:  Painful subacute lumbar L3 and L4 compression fracture deformities. EXAM: KYPHOPLASTY AT LUMBAR L3 KYPHOPLASTY AT LUMBAR L4 TECHNIQUE: The procedure, risks (including but not limited to bleeding, infection, organ damage), benefits, and alternatives were explained to the patient. Questions regarding the procedure were encouraged and answered. The patient understands and consents to the procedure. The patient was placed prone on the fluoroscopic table. The skin overlying the LUMBAR region was then prepped and draped in the usual sterile fashion. Maximal barrier sterile technique was utilized including caps, mask, sterile gowns, sterile gloves, sterile drape, hand hygiene and skin antiseptic. Intravenous Fentanyl and Versed were administered as conscious sedation during continuous cardiorespiratory monitoring by the radiology RN, with a total moderate sedation time of 42 minutes. As antibiotic prophylaxis, VANCOMYCIN 1.5 G was ordered pre-procedure and administered intravenously within !one hour! of incision. The right pedicle at LUMBAR L4 was then infiltrated with 1% lidocaine  followed by the advancement of a Kyphon trocar  needle through the right pedicle into the posterior one-third. The trocar was removed and the osteo drill was advanced to the anterior third of the vertebral body. The osteo drill was retracted. Through the working cannula, a Kyphon inflatable bone tamp 15 x 2 was advanced and positioned with the distal marker 5 mm from the anterior aspect of the cortex. Crossing of the midline was not seen on the AP projection. At this time, the balloon was expanded using contrast via a Kyphon inflation syringe device via micro tubing. In similar fashion, the left pedicle was infiltrated with 1% lidocaine followed by the advancement of a second Kyphon trocar needle through the left pedicle into the posterior third of the vertebral body. The osteo drill was coaxially advanced to the anterior right third. The osteo drill was exchanged for a Kyphon inflatable bone tamp 15 x 2, advanced to the 5 mm of the anterior aspect of the cortex. The balloon was then expanded using contrast as above. Inflations were continued until there was near apposition across the midline and with the superior and the inferior end plates. In similar fashion at lumbar L3, was then infiltrated with 1% lidocaine followed by the advancement of a Kyphon trocar needle through the right pedicle into the posterior one-third. The trocar was removed and the osteo drill was advanced to the anterior third of the vertebral body. The osteo drill was retracted. Through the working cannula, a Kyphon inflatable bone tamp 15 x 2 was advanced and positioned with the distal marker 5 mm from the anterior aspect of the cortex. Crossing of the midline was seen on the AP projection. At this time, the balloon was expanded using contrast via a Kyphon inflation syringe device via micro tubing. At this time, methylmethacrylate mixture was reconstituted in the Kyphon bone mixing device system. This was then loaded into the delivery  mechanism, attached to Kyphon bone fillers. The balloons were deflated and removed followed by the instillation of methylmethacrylate mixture with excellent filling in the AP and lateral projections at both levels. No extravasation was noted in the disk spaces or posteriorly into the spinal canal. No epidural venous contamination was seen. The patient tolerated the procedure well. There were no acute complications. The working cannulae and the bone filler were then retrieved and removed. COMPLICATIONS: COMPLICATIONS None immediate. IMPRESSION: 1. Status post vertebral body augmentation using balloon kyphoplasty at lumbar L3 as described without event. 2. Status post vertebral body augmentation using balloon kyphoplasty at lumbar L4 as described without event. 3. Per CMS PQRS reporting requirements (PQRS Measure 24): Given the patient's age of greater than 48 and the fracture site (hip, distal radius, or spine), the patient should be tested for osteoporosis using DXA, and the appropriate treatment considered based on the DXA results. Electronically Signed   By: Lucrezia Europe M.D.   On: 04/16/2018 16:56   Ir Kypho Ea Addl Level Thoracic Or Lumbar  Result Date: 04/16/2018 CLINICAL DATA:  Painful subacute lumbar L3 and L4 compression fracture deformities. EXAM: KYPHOPLASTY AT LUMBAR L3 KYPHOPLASTY AT LUMBAR L4 TECHNIQUE: The procedure, risks (including but not limited to bleeding, infection, organ damage), benefits, and alternatives were explained to the patient. Questions regarding the procedure were encouraged and answered. The patient understands and consents to the procedure. The patient was placed prone on the fluoroscopic table. The skin overlying the LUMBAR region was then prepped and draped in the usual sterile fashion. Maximal barrier sterile technique was utilized including caps, mask, sterile gowns, sterile gloves,  sterile drape, hand hygiene and skin antiseptic. Intravenous Fentanyl and Versed were  administered as conscious sedation during continuous cardiorespiratory monitoring by the radiology RN, with a total moderate sedation time of 42 minutes. As antibiotic prophylaxis, VANCOMYCIN 1.5 G was ordered pre-procedure and administered intravenously within !one hour! of incision. The right pedicle at LUMBAR L4 was then infiltrated with 1% lidocaine followed by the advancement of a Kyphon trocar needle through the right pedicle into the posterior one-third. The trocar was removed and the osteo drill was advanced to the anterior third of the vertebral body. The osteo drill was retracted. Through the working cannula, a Kyphon inflatable bone tamp 15 x 2 was advanced and positioned with the distal marker 5 mm from the anterior aspect of the cortex. Crossing of the midline was not seen on the AP projection. At this time, the balloon was expanded using contrast via a Kyphon inflation syringe device via micro tubing. In similar fashion, the left pedicle was infiltrated with 1% lidocaine followed by the advancement of a second Kyphon trocar needle through the left pedicle into the posterior third of the vertebral body. The osteo drill was coaxially advanced to the anterior right third. The osteo drill was exchanged for a Kyphon inflatable bone tamp 15 x 2, advanced to the 5 mm of the anterior aspect of the cortex. The balloon was then expanded using contrast as above. Inflations were continued until there was near apposition across the midline and with the superior and the inferior end plates. In similar fashion at lumbar L3, was then infiltrated with 1% lidocaine followed by the advancement of a Kyphon trocar needle through the right pedicle into the posterior one-third. The trocar was removed and the osteo drill was advanced to the anterior third of the vertebral body. The osteo drill was retracted. Through the working cannula, a Kyphon inflatable bone tamp 15 x 2 was advanced and positioned with the distal marker 5  mm from the anterior aspect of the cortex. Crossing of the midline was seen on the AP projection. At this time, the balloon was expanded using contrast via a Kyphon inflation syringe device via micro tubing. At this time, methylmethacrylate mixture was reconstituted in the Kyphon bone mixing device system. This was then loaded into the delivery mechanism, attached to Kyphon bone fillers. The balloons were deflated and removed followed by the instillation of methylmethacrylate mixture with excellent filling in the AP and lateral projections at both levels. No extravasation was noted in the disk spaces or posteriorly into the spinal canal. No epidural venous contamination was seen. The patient tolerated the procedure well. There were no acute complications. The working cannulae and the bone filler were then retrieved and removed. COMPLICATIONS: COMPLICATIONS None immediate. IMPRESSION: 1. Status post vertebral body augmentation using balloon kyphoplasty at lumbar L3 as described without event. 2. Status post vertebral body augmentation using balloon kyphoplasty at lumbar L4 as described without event. 3. Per CMS PQRS reporting requirements (PQRS Measure 24): Given the patient's age of greater than 72 and the fracture site (hip, distal radius, or spine), the patient should be tested for osteoporosis using DXA, and the appropriate treatment considered based on the DXA results. Electronically Signed   By: Lucrezia Europe M.D.   On: 04/16/2018 16:56   Dg Hips Bilat With Pelvis 2v  Result Date: 04/13/2018 CLINICAL DATA:  Worsening hip pain EXAM: DG HIP (WITH OR WITHOUT PELVIS) 2V BILAT COMPARISON:  None. FINDINGS: Mild spurring in the hip joints bilaterally. No  fracture, subluxation or dislocation. SI joints are symmetric and unremarkable. 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 No acute bony abnormality. Electronically Signed   By: Rolm Baptise M.D.   On: 04/13/2018 11:47    Labs:  CBC: Recent  Labs    04/16/18 0006 04/17/18 0108 04/18/18 0358 04/19/18 0412  WBC 10.4 12.4* 9.2 12.1*  HGB 14.7 14.8 15.4 15.2  HCT 42.5 42.7 43.8 42.9  PLT 209 202 181 177    COAGS: Recent Labs    04/18/18 0358 04/19/18 0412 04/23/18 04/28/18  INR 1.31 1.93 2.6* 1.9*    BMP: Recent Labs    04/12/18 2122 04/13/18 0501 04/16/18 0006 04/19/18 0412  NA 144 145 140 138  K 4.4 4.3 3.8 3.6  CL 108 108 106 107  CO2 _0 GLUCOSE 111* 117* 161* 136*  BUN 21* 21* 32* 30*  CALCIUM 9.7 9.2 8.8* 8.5*  CREATININE 1.33* 1.31* 1.20 1.16  GFRNONAA 54* 55* >60 >60  GFRAA >60 >60 >60 >60    LIVER FUNCTION TESTS: Recent Labs    03/25/18 0549  BILITOT 0.7  AST 18  ALT 20  ALKPHOS 85  PROT 7.3  ALBUMIN 3.5    TUMOR MARKERS: No results for input(s): AFPTM, CEA, CA199, CHROMGRNA in the last 8760 hours.  Assessment and Plan:  My impression is that he has done very well post lumbar L3 and L4 kyphoplasty for painful subacute compression fractures.  His presenting back pain has essentially resolved.  We discussed the long-term expectations.  We discussed the low likelihood of new fractures at the treated levels.  We discussed the possibility of fractures at additional levels in the thoracic and lumbar spine secondary to his underlying risk factors and osteoporosis.  We discussed the importance of continued bone building therapy.  I can see him back on a as needed basis should he have any new spinal pain or issues.  He will plan to follow-up with Dr. Linna Darner regarding his left hip pain.  Thank you for this interesting consult.  I greatly enjoyed meeting Dakota Hall and look forward to participating in their care.  A copy of this report was sent to the requesting provider on this date.  Electronically Signed: Rickard Rhymes 05/04/2018, 11:29 AM   I spent a total of    15 Minutes in face to face in clinical consultation, greater than 50% of which was counseling/coordinating  care for lumbar compression fractures, post kyphoplasty.

## 2018-05-10 ENCOUNTER — Non-Acute Institutional Stay (SKILLED_NURSING_FACILITY): Payer: Medicare HMO | Admitting: Adult Health

## 2018-05-10 ENCOUNTER — Encounter: Payer: Self-pay | Admitting: Adult Health

## 2018-05-10 DIAGNOSIS — F418 Other specified anxiety disorders: Secondary | ICD-10-CM

## 2018-05-10 DIAGNOSIS — S32040D Wedge compression fracture of fourth lumbar vertebra, subsequent encounter for fracture with routine healing: Secondary | ICD-10-CM

## 2018-05-10 DIAGNOSIS — D72829 Elevated white blood cell count, unspecified: Secondary | ICD-10-CM | POA: Diagnosis not present

## 2018-05-10 NOTE — Progress Notes (Signed)
Location:  [facility] Room Number: 903-Y Place of Service:  SNF (31) Provider:  Durenda Age, NP  Patient Care Team: Leeroy Cha, MD as PCP - General (Internal Medicine)  Extended Emergency Contact Information Primary Emergency Contact: Charlyne Quale Address: [address]          [place], [place] [address] Johnnette Litter of [place] Phone: [phone] Work Phone: [phone] Mobile Phone: [phone] Relation: Spouse Secondary Emergency Contact: [place] of [place] Mobile Phone: [phone] Relation: Son  Code Status:  Full Code  Goals of care: Advanced Directive information Advanced Directives 04/20/2018  Does Patient Have a Medical Advance Directive? Yes  Type of Advance Directive Out of facility DNR (pink MOST or yellow form)  Does patient want to make changes to medical advance directive? No - Patient declined  Would patient like information on creating a medical advance directive? No - Patient declined  Pre-existing out of facility DNR order (yellow form or pink MOST form) -     Chief Complaint  Patient presents with  . Acute Visit    The patient is seen for medication management - is at risk for seratonin syndrome.    HPI:  Pt is a 67 y.o. male seen today for an acute visit for medication management.  He is a short-term rehabilitation resident of [facility].  He has a PMH of stroke x2 with right-sided hemiplegia, essential hypertension, bipolar disorder, diabetes, anxiety/depression, CKD stage 3, and OSA. He was seen today after attending bingo. He was brought to his room. He is currently on Tramadol 50 mg Q 6 hours PRN for pain and Sertraline 150 mg Q AM for depression. Discussed with patient the probability of sertonin syndrome with the combination. He was agreeable to decreasing Tramadol dosage. He verbalized that his pain level at this time was 2/10. He has lumbar brace  and and right foot metal splint.   He was recently admitted to [facility] on 04/19/18 after a recent hospitalization. He was having worsening back pain. Last May 2019, he was getting up from bed and felt a sudden sharp pain in his lower back. He went to the hospital and was found to have acute L4 compression fracture with 60% height loss. Initially, it was decided to treat it conservatively but pain worsened. MRI showed acute L3 and L4 fracture which progressed even further since prior imaging. He had lumbar kyphoplasty on 04/16/18.   Past Medical History:  Diagnosis Date  . 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 Back spasm   . Chronic kidney disease (CKD), stage III (moderate) (HCC)   . Depression with anxiety   . Diabetes mellitus   . Dysarthria   . Dyslipidemia   . Hypertension   . Obesity   . OSA on CPAP   . Pulmonary emboli ([place]) 02/2014  . Stasis edema of both lower extremities   . Stroke [facility]) July 2013   left paramedian pontine, incidental right parietal subcortical. both d/t small vessel disease  . Stroke [facility]) March 2015   left basal ganglia secondary to small vessel disease   Past Surgical History:  Procedure Laterality Date  . ANAL FISSURE REPAIR    . IR KYPHO EA ADDL LEVEL THORACIC OR LUMBAR  04/16/2018  . IR KYPHO LUMBAR INC FX REDUCE BONE BX UNI/BIL CANNULATION INC/IMAGING  04/16/2018  . IR RADIOLOGIST EVAL & MGMT  05/04/2018  . TONSILLECTOMY  Allergies  Allergen Reactions  . Penicillins Hives and Other (See Comments)    Has patient had a PCN reaction causing immediate rash, facial/tongue/throat swelling, SOB or lightheadedness with hypotension: no Has patient had a PCN reaction causing severe rash involving mucus membranes or skin necrosis: no Has patient had a PCN reaction that required hospitalization: no Has patient had a PCN reaction occurring within the last 10 years: no If all of the above answers are  "NO", then may proceed with Cephalosporin use.     Outpatient Encounter Medications as of 05/10/2018  Medication Sig  . acetaminophen (TYLENOL) 325 MG tablet Take 2 tablets (650 mg total) by mouth every 6 (six) hours as needed for mild pain (or Fever >/= 101).  Marland Kitchen amLODipine (NORVASC) 10 MG tablet Take 10 mg by mouth daily.   . ARIPiprazole (ABILIFY) 5 MG tablet Take 2.5 mg by mouth daily. Take 1/2 tablet to = 2.5 mg  . baclofen (LIORESAL) 20 MG tablet Take 1 tablet (20 mg total) by mouth 3 (three) times daily as needed for muscle spasms.  Marland Kitchen docusate sodium (COLACE) 100 MG capsule Take 100 mg by mouth 3 (three) times a week. M-W-F  . furosemide (LASIX) 20 MG tablet Take 20 mg by mouth daily.   . hydrALAZINE (APRESOLINE) 25 MG tablet Take 25 mg by mouth 2 (two) times daily.   . isosorbide mononitrate (IMDUR) 30 MG 24 hr tablet Take 30 mg by mouth daily.   Marland Kitchen lidocaine (LIDODERM) 5 % Place 1 patch onto the skin daily. Remove & Discard patch within 12 hours or as directed by MD  . Menthol, Topical Analgesic, (BIOFREEZE) 4 % GEL Apply 1 application topically 2 (two) times daily as needed (Pain). Apply to joints  . nystatin (MYCOSTATIN/NYSTOP) powder Apply topically 2 (two) times daily.  Marland Kitchen olmesartan (BENICAR) 5 MG tablet Take 5 mg by mouth daily.   . ondansetron (ZOFRAN) 4 MG tablet Take 4 mg by mouth every 6 (six) hours as needed for nausea or vomiting.   . pioglitazone (ACTOS) 15 MG tablet Take 7.5 mg by mouth daily.   . potassium chloride SA (K-DUR,KLOR-CON) 20 MEQ tablet Take 20 mEq by mouth daily.  . sertraline (ZOLOFT) 100 MG tablet Take 150 mg by mouth daily.   . simvastatin (ZOCOR) 20 MG tablet Take 1 tablet (20 mg total) by mouth daily at 6 PM.  . traMADol (ULTRAM) 50 MG tablet Take 2 tablets (100 mg total) by mouth every 6 (six) hours as needed for moderate pain.  . Vitamin D, Ergocalciferol, (DRISDOL) 50000 UNITS CAPS capsule Take 50,000 Units by mouth every Friday.   . warfarin  (COUMADIN) 2.5 MG tablet Take 2.5-5 mg by mouth See admin instructions. Take 1 tablet M-Tue-Thu-Sat-Sun, take 2 tablets Wed and Friday.  . Blood Glucose Monitoring Suppl (ONE TOUCH ULTRA 2) W/DEVICE KIT   . ONE TOUCH ULTRA TEST test strip   . ONETOUCH DELICA LANCETS 82U MISC   . [DISCONTINUED] enoxaparin (LOVENOX) 120 MG/0.8ML injection Inject 0.8 mLs (120 mg total) into the skin 2 (two) times daily.  . [DISCONTINUED] predniSONE (DELTASONE) 10 MG tablet Day 1 take 5 pills, day 2 take 4 pills, day 3 take 3 pills, day 4 take 2 pills, day 5 take 1 pill.   No facility-administered encounter medications on file as of 05/10/2018.     Review of Systems  GENERAL: No change in appetite, no fatigue, no weight changes, no fever, chills or weakness MOUTH and THROAT: Denies  oral discomfort, gingival pain or bleeding RESPIRATORY: no cough, SOB, DOE, wheezing, hemoptysis CARDIAC: No chest pain, edema or palpitations GI: No abdominal pain, diarrhea, constipation, heart burn, nausea or vomiting GU: Denies dysuria, frequency, hematuria, incontinence, or discharge PSYCHIATRIC: Denies feelings of depression or anxiety. No report of hallucinations, insomnia, paranoia, or agitation   Immunization History  Administered Date(s) Administered  . Influenza Split 08/17/2013  . Pneumococcal-Unspecified 01/15/2014   Pertinent  Health Maintenance Due  Topic Date Due  . FOOT EXAM  05/20/1961  . OPHTHALMOLOGY EXAM  05/20/1961  . COLONOSCOPY  05/20/2001  . HEMOGLOBIN A1C  10/23/2014  . PNA vac Low Risk Adult (1 of 2 - PCV13) 05/20/2016  . INFLUENZA VACCINE  06/17/2018   Fall Risk  03/24/2017 01/22/2016  Falls in the past year? No No      Vitals:   05/10/18 0943  BP: 130/70  Pulse: 70  Resp: 19  Temp: 98.8 F (37.1 C)  TempSrc: Oral  SpO2: 95%  Weight: 232 lb (105.2 kg)  Height: '5\' 7"'  (1.702 m)   Body mass index is 36.34 kg/m.  Physical Exam  GENERAL APPEARANCE: Well nourished. In no acute distress.  Obese SKIN:  Skin is warm and dry.  MOUTH and THROAT: Lips are without lesions. Oral mucosa is moist and without lesions. Tongue is normal in shape, size, and color and without lesions RESPIRATORY: Breathing is even & unlabored, BS CTAB CARDIAC: RRR, no murmur,no extra heart sounds, no edema GI: Abdomen soft, normal BS, no masses, no tenderness EXTREMITIES:  Able to move LUE and LLE NEUROLOGICAL: Right hemiplegia PSYCHIATRIC: Alert and oriented X 3. Affect and behavior are appropriate   Labs reviewed: Recent Labs    04/13/18 0501 04/16/18 0006 04/19/18 0412  NA 145 140 138  K 4.3 3.8 3.6  CL 108 106 107  CO2 '24 27 22  ' GLUCOSE 117* 161* 136*  BUN 21* 32* 30*  CREATININE 1.31* 1.20 1.16  CALCIUM 9.2 8.8* 8.5*   Recent Labs    03/25/18 0549  AST 18  ALT 20  ALKPHOS 85  BILITOT 0.7  PROT 7.3  ALBUMIN 3.5   Recent Labs    03/24/18 0210  04/17/18 0108 04/18/18 0358 04/19/18 0412  WBC 7.9   < > 12.4* 9.2 12.1*  NEUTROABS 5.0  --   --   --   --   HGB 16.4   < > 14.8 15.4 15.2  HCT 46.4   < > 42.7 43.8 42.9  MCV 88.4   < > 85.7 85.0 84.4  PLT 177   < > 202 181 177   < > = values in this interval not displayed.   Lab Results  Component Value Date   TSH 3.840 04/22/2014   Lab Results  Component Value Date   HGBA1C 6.9 (H) 04/23/2014   Lab Results  Component Value Date   CHOL 180 04/23/2014   HDL 35 (L) 04/23/2014   LDLCALC 100 (H) 04/23/2014   TRIG 224 (H) 04/23/2014   CHOLHDL 5.1 04/23/2014    Significant Diagnostic Results in last 30 days:  Ct Lumbar Spine Wo Contrast  Result Date: 04/12/2018 CLINICAL DATA:  Worsening low back pain. History of L4 compression fracture. EXAM: CT LUMBAR SPINE WITHOUT CONTRAST TECHNIQUE: Multidetector CT imaging of the lumbar spine was performed without intravenous contrast administration. Multiplanar CT image reconstructions were also generated. COMPARISON:  CT abdomen pelvis 03/24/2018 FINDINGS: Segmentation: 5 lumbar type  vertebrae. Alignment: Normal. Vertebrae: Chronic compression deformities  of L1 and L4. There has been progression of L4 height loss compared to 03/24/2018. No new compression fracture. Paraspinal and other soft tissues: Atherosclerotic calcification of the abdominal aorta. Disc levels: Mild narrowing of the spinal canal at L3-L4 and L4-L5. Mild-to-moderate bilateral L4-5 foraminal stenosis. Otherwise, the spinal canal is widely patent. IMPRESSION: 1. Mild progression of height loss at L4 compared to the 03/24/2018 study. Unchanged appearance of L1 compression fracture. 2. Mild spinal canal narrowing at L3-4 and L4-5 with mild-to-moderate bilateral L4-5 foraminal stenosis. 3.  Aortic Atherosclerosis (ICD10-I70.0). Electronically Signed   By: Ulyses Jarred M.D.   On: 04/12/2018 23:00   Mr Lumbar Spine Wo Contrast  Result Date: 04/13/2018 CLINICAL DATA:  Worsening low back pain. The patient suffered an L4 compression fracture in a fall 03/16/2018. EXAM: MRI LUMBAR SPINE WITHOUT CONTRAST TECHNIQUE: Multiplanar, multisequence MR imaging of the lumbar spine was performed. No intravenous contrast was administered. COMPARISON:  CT lumbar spine 04/12/2018. CT abdomen and pelvis 03/24/2018 and 05/13/2016. FINDINGS: Segmentation:  Standard. Alignment:  Maintained. Vertebrae: Remote L1 compression fracture is identified as seen on all of the prior examinations. The patient also has a remote L2 compression fracture which is seen on the 05/13/2016 CT. There are acute or subacute compression fractures of L3 and L4 with marrow edema in both vertebral bodies. Vertebral body height loss at L3 is approximately 40%. There is near vertebra plana deformity of the central aspect of L4. Vertebral body height loss has not progressed at either level since the most recent CT scan. Marrow edema in both vertebral bodies is more extensive in L4. No other fracture is identified. Marrow signal is otherwise unremarkable. Conus medullaris and  cauda equina: Conus extends to the L1 level. Conus and cauda equina appear normal. Paraspinal and other soft tissues: Small left renal cyst is noted in seen on prior exams. Disc levels: T11-12 is imaged in the sagittal plane only and negative. T12-L1: No bony retropulsion off L1. No disc bulge or protrusion. The central canal and foramina are open. L1-2: Minimal disc bulge without stenosis. L2-3: Negative. L3-4: Shallow disc bulge. Also seen is bony retropulsion off the posterior margin of L4 resulting in mild to moderate central canal stenosis in conjunction with ligamentum flavum thickening. The foramina are open. L4-5: Minimal disc bulge and mild facet degenerative disease without stenosis. L5-S1: Epidural fat is prominent. Mild facet arthropathy. Minimal disc bulge without stenosis. IMPRESSION: The examination is positive for acute or subacute compression fractures of L3 and L4 with vertebral body height loss at L3 of approximately 40% and near vertebra plana deformity centrally of the central aspect L4, unchanged since the most recent exam. Bony retropulsion off the posterior aspect of L4 causes mild to moderate central canal stenosis. There is no retropulsion at L3. Remote L1 and L2 compression fractures. Mild degenerative disc disease as described above. Electronically Signed   By: Inge Rise M.D.   On: 04/13/2018 15:03   Ir Kypho Lumbar Inc Fx Reduce Bone Bx Uni/bil Cannulation Inc/imaging  Result Date: 04/16/2018 CLINICAL DATA:  Painful subacute lumbar L3 and L4 compression fracture deformities. EXAM: KYPHOPLASTY AT LUMBAR L3 KYPHOPLASTY AT LUMBAR L4 TECHNIQUE: The procedure, risks (including but not limited to bleeding, infection, organ damage), benefits, and alternatives were explained to the patient. Questions regarding the procedure were encouraged and answered. The patient understands and consents to the procedure. The patient was placed prone on the fluoroscopic table. The skin overlying the  LUMBAR region was then prepped and  draped in the usual sterile fashion. Maximal barrier sterile technique was utilized including caps, mask, sterile gowns, sterile gloves, sterile drape, hand hygiene and skin antiseptic. Intravenous Fentanyl and Versed were administered as conscious sedation during continuous cardiorespiratory monitoring by the radiology RN, with a total moderate sedation time of 42 minutes. As antibiotic prophylaxis, VANCOMYCIN 1.5 G was ordered pre-procedure and administered intravenously within !one hour! of incision. The right pedicle at LUMBAR L4 was then infiltrated with 1% lidocaine followed by the advancement of a Kyphon trocar needle through the right pedicle into the posterior one-third. The trocar was removed and the osteo drill was advanced to the anterior third of the vertebral body. The osteo drill was retracted. Through the working cannula, a Kyphon inflatable bone tamp 15 x 2 was advanced and positioned with the distal marker 5 mm from the anterior aspect of the cortex. Crossing of the midline was not seen on the AP projection. At this time, the balloon was expanded using contrast via a Kyphon inflation syringe device via micro tubing. In similar fashion, the left pedicle was infiltrated with 1% lidocaine followed by the advancement of a second Kyphon trocar needle through the left pedicle into the posterior third of the vertebral body. The osteo drill was coaxially advanced to the anterior right third. The osteo drill was exchanged for a Kyphon inflatable bone tamp 15 x 2, advanced to the 5 mm of the anterior aspect of the cortex. The balloon was then expanded using contrast as above. Inflations were continued until there was near apposition across the midline and with the superior and the inferior end plates. In similar fashion at lumbar L3, was then infiltrated with 1% lidocaine followed by the advancement of a Kyphon trocar needle through the right pedicle into the posterior  one-third. The trocar was removed and the osteo drill was advanced to the anterior third of the vertebral body. The osteo drill was retracted. Through the working cannula, a Kyphon inflatable bone tamp 15 x 2 was advanced and positioned with the distal marker 5 mm from the anterior aspect of the cortex. Crossing of the midline was seen on the AP projection. At this time, the balloon was expanded using contrast via a Kyphon inflation syringe device via micro tubing. At this time, methylmethacrylate mixture was reconstituted in the Kyphon bone mixing device system. This was then loaded into the delivery mechanism, attached to Kyphon bone fillers. The balloons were deflated and removed followed by the instillation of methylmethacrylate mixture with excellent filling in the AP and lateral projections at both levels. No extravasation was noted in the disk spaces or posteriorly into the spinal canal. No epidural venous contamination was seen. The patient tolerated the procedure well. There were no acute complications. The working cannulae and the bone filler were then retrieved and removed. COMPLICATIONS: COMPLICATIONS None immediate. IMPRESSION: 1. Status post vertebral body augmentation using balloon kyphoplasty at lumbar L3 as described without event. 2. Status post vertebral body augmentation using balloon kyphoplasty at lumbar L4 as described without event. 3. Per CMS PQRS reporting requirements (PQRS Measure 24): Given the patient's age of greater than 107 and the fracture site (hip, distal radius, or spine), the patient should be tested for osteoporosis using DXA, and the appropriate treatment considered based on the DXA results. Electronically Signed   By: Lucrezia Europe M.D.   On: 04/16/2018 16:56   Ir Kypho Ea Addl Level Thoracic Or Lumbar  Result Date: 04/16/2018 CLINICAL DATA:  Painful subacute lumbar L3  and L4 compression fracture deformities. EXAM: KYPHOPLASTY AT LUMBAR L3 KYPHOPLASTY AT LUMBAR L4 TECHNIQUE:  The procedure, risks (including but not limited to bleeding, infection, organ damage), benefits, and alternatives were explained to the patient. Questions regarding the procedure were encouraged and answered. The patient understands and consents to the procedure. The patient was placed prone on the fluoroscopic table. The skin overlying the LUMBAR region was then prepped and draped in the usual sterile fashion. Maximal barrier sterile technique was utilized including caps, mask, sterile gowns, sterile gloves, sterile drape, hand hygiene and skin antiseptic. Intravenous Fentanyl and Versed were administered as conscious sedation during continuous cardiorespiratory monitoring by the radiology RN, with a total moderate sedation time of 42 minutes. As antibiotic prophylaxis, VANCOMYCIN 1.5 G was ordered pre-procedure and administered intravenously within !one hour! of incision. The right pedicle at LUMBAR L4 was then infiltrated with 1% lidocaine followed by the advancement of a Kyphon trocar needle through the right pedicle into the posterior one-third. The trocar was removed and the osteo drill was advanced to the anterior third of the vertebral body. The osteo drill was retracted. Through the working cannula, a Kyphon inflatable bone tamp 15 x 2 was advanced and positioned with the distal marker 5 mm from the anterior aspect of the cortex. Crossing of the midline was not seen on the AP projection. At this time, the balloon was expanded using contrast via a Kyphon inflation syringe device via micro tubing. In similar fashion, the left pedicle was infiltrated with 1% lidocaine followed by the advancement of a second Kyphon trocar needle through the left pedicle into the posterior third of the vertebral body. The osteo drill was coaxially advanced to the anterior right third. The osteo drill was exchanged for a Kyphon inflatable bone tamp 15 x 2, advanced to the 5 mm of the anterior aspect of the cortex. The balloon was  then expanded using contrast as above. Inflations were continued until there was near apposition across the midline and with the superior and the inferior end plates. In similar fashion at lumbar L3, was then infiltrated with 1% lidocaine followed by the advancement of a Kyphon trocar needle through the right pedicle into the posterior one-third. The trocar was removed and the osteo drill was advanced to the anterior third of the vertebral body. The osteo drill was retracted. Through the working cannula, a Kyphon inflatable bone tamp 15 x 2 was advanced and positioned with the distal marker 5 mm from the anterior aspect of the cortex. Crossing of the midline was seen on the AP projection. At this time, the balloon was expanded using contrast via a Kyphon inflation syringe device via micro tubing. At this time, methylmethacrylate mixture was reconstituted in the Kyphon bone mixing device system. This was then loaded into the delivery mechanism, attached to Kyphon bone fillers. The balloons were deflated and removed followed by the instillation of methylmethacrylate mixture with excellent filling in the AP and lateral projections at both levels. No extravasation was noted in the disk spaces or posteriorly into the spinal canal. No epidural venous contamination was seen. The patient tolerated the procedure well. There were no acute complications. The working cannulae and the bone filler were then retrieved and removed. COMPLICATIONS: COMPLICATIONS None immediate. IMPRESSION: 1. Status post vertebral body augmentation using balloon kyphoplasty at lumbar L3 as described without event. 2. Status post vertebral body augmentation using balloon kyphoplasty at lumbar L4 as described without event. 3. Per CMS PQRS reporting requirements (PQRS Measure 24):  Given the patient's age of greater than 70 and the fracture site (hip, distal radius, or spine), the patient should be tested for osteoporosis using DXA, and the appropriate  treatment considered based on the DXA results. Electronically Signed   By: Lucrezia Europe M.D.   On: 04/16/2018 16:56   Dg Hips Bilat With Pelvis 2v  Result Date: 04/13/2018 CLINICAL DATA:  Worsening hip pain EXAM: DG HIP (WITH OR WITHOUT PELVIS) 2V BILAT COMPARISON:  None. FINDINGS: Mild spurring in the hip joints bilaterally. No fracture, subluxation or dislocation. SI joints are symmetric and unremarkable. 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 No acute bony abnormality. Electronically Signed   By: Rolm Baptise M.D.   On: 04/13/2018 11:47   Ir Radiologist Eval & Mgmt  Result Date: 05/04/2018 Please refer to notes tab for details about interventional procedure. (Op Note)   Assessment/Plan  1. Closed compression fracture of L4 lumbar vertebra with routine healing, subsequent encounter - S/P kyphoplasty on 04/16/18, discussed possible serotonin syndrome with Tramadol and Sertraline, will decrease Tramadol 50 mg  From Q 6 hours PRN to Q 12 hours PRN   2. Depression with anxiety - mood is stable, he follows up with Dr. Rosana Berger, continue Sertaline 150 mg daily   3. Leukocytosis, unspecified type - no fever, will repeat CBC Lab Results  Component Value Date   WBC 12.1 (H) 04/19/2018       Family/ staff Communication: Discussed plan of care with patient.  Labs/tests ordered:  CBC and BMP  Goals of care:   Short-term rehabilitation.     Durenda Age, NP [facility] [phone] (Monday-Friday 8:00 a.m. - 5:00 p.m.) [phone] (after hours)

## 2018-05-13 ENCOUNTER — Non-Acute Institutional Stay (SKILLED_NURSING_FACILITY): Payer: Medicare HMO | Admitting: Adult Health

## 2018-05-13 ENCOUNTER — Encounter: Payer: Self-pay | Admitting: Adult Health

## 2018-05-13 DIAGNOSIS — Z8673 Personal history of transient ischemic attack (TIA), and cerebral infarction without residual deficits: Secondary | ICD-10-CM

## 2018-05-13 DIAGNOSIS — S32040S Wedge compression fracture of fourth lumbar vertebra, sequela: Secondary | ICD-10-CM

## 2018-05-13 DIAGNOSIS — I10 Essential (primary) hypertension: Secondary | ICD-10-CM | POA: Diagnosis not present

## 2018-05-13 DIAGNOSIS — E118 Type 2 diabetes mellitus with unspecified complications: Secondary | ICD-10-CM

## 2018-05-13 DIAGNOSIS — F319 Bipolar disorder, unspecified: Secondary | ICD-10-CM

## 2018-05-13 DIAGNOSIS — Z86711 Personal history of pulmonary embolism: Secondary | ICD-10-CM | POA: Diagnosis not present

## 2018-05-13 MED ORDER — BACLOFEN 20 MG PO TABS: 20.0000 mg | ORAL_TABLET | Freq: Three times a day (TID) | ORAL | 0 refills | Status: AC | PRN

## 2018-05-13 MED ORDER — ARIPIPRAZOLE 5 MG PO TABS: 2.5000 mg | ORAL_TABLET | Freq: Every day | ORAL | 0 refills | Status: DC

## 2018-05-13 MED ORDER — ONDANSETRON HCL 4 MG PO TABS: 4.0000 mg | ORAL_TABLET | Freq: Four times a day (QID) | ORAL | 0 refills | Status: AC | PRN

## 2018-05-13 MED ORDER — AMLODIPINE BESYLATE 10 MG PO TABS: 10.0000 mg | ORAL_TABLET | Freq: Every day | ORAL | 0 refills | Status: AC

## 2018-05-13 MED ORDER — HYDRALAZINE HCL 25 MG PO TABS: 25.0000 mg | ORAL_TABLET | Freq: Two times a day (BID) | ORAL | 0 refills | Status: AC

## 2018-05-13 MED ORDER — GLUCOSE BLOOD VI STRP: ORAL_STRIP | 0 refills | Status: AC

## 2018-05-13 MED ORDER — SERTRALINE HCL 100 MG PO TABS
150.0000 mg | ORAL_TABLET | Freq: Every day | ORAL | 0 refills | Status: DC
Start: 2018-05-13 — End: 2019-06-13

## 2018-05-13 MED ORDER — LIDOCAINE 5 % EX PTCH: 1.0000 | MEDICATED_PATCH | CUTANEOUS | 0 refills | Status: AC

## 2018-05-13 MED ORDER — FUROSEMIDE 20 MG PO TABS: 20.0000 mg | ORAL_TABLET | Freq: Every day | ORAL | 0 refills | Status: DC

## 2018-05-13 MED ORDER — WARFARIN SODIUM 2.5 MG PO TABS: 2.5000 mg | ORAL_TABLET | ORAL | 0 refills | Status: DC

## 2018-05-13 MED ORDER — MENTHOL (TOPICAL ANALGESIC) 4 % EX GEL: 1.0000 "application " | Freq: Two times a day (BID) | CUTANEOUS | 0 refills | Status: AC | PRN

## 2018-05-13 MED ORDER — ISOSORBIDE MONONITRATE ER 30 MG PO TB24: 30.0000 mg | ORAL_TABLET | Freq: Every day | ORAL | 0 refills | Status: AC

## 2018-05-13 MED ORDER — NYSTATIN 100000 UNIT/GM EX POWD: Freq: Two times a day (BID) | CUTANEOUS | 0 refills | Status: AC

## 2018-05-13 MED ORDER — ONETOUCH DELICA LANCETS 33G MISC: 1.0000 | Freq: Every day | 0 refills | Status: AC | PRN

## 2018-05-13 MED ORDER — PIOGLITAZONE HCL 15 MG PO TABS: 7.5000 mg | ORAL_TABLET | Freq: Every day | ORAL | 0 refills | Status: AC

## 2018-05-13 MED ORDER — POTASSIUM CHLORIDE CRYS ER 20 MEQ PO TBCR: 20.0000 meq | EXTENDED_RELEASE_TABLET | Freq: Every day | ORAL | 0 refills | Status: AC

## 2018-05-13 MED ORDER — OLMESARTAN MEDOXOMIL 5 MG PO TABS: 5.0000 mg | ORAL_TABLET | Freq: Every day | ORAL | 0 refills | Status: AC

## 2018-05-13 MED ORDER — TRAMADOL HCL 50 MG PO TABS: 50.0000 mg | ORAL_TABLET | Freq: Two times a day (BID) | ORAL | 0 refills | Status: DC | PRN

## 2018-05-13 MED ORDER — DOCUSATE SODIUM 100 MG PO CAPS: 100.0000 mg | ORAL_CAPSULE | ORAL | 0 refills | Status: AC

## 2018-05-13 MED ORDER — SIMVASTATIN 20 MG PO TABS: 20.0000 mg | ORAL_TABLET | Freq: Every day | ORAL | 0 refills | Status: AC

## 2018-05-13 NOTE — Progress Notes (Signed)
Location:  [facility] Room Number: 035-W Place of Service:  SNF (31) Provider:  Durenda Age, NP  Patient Care Team: Leeroy Cha, MD as PCP - General (Internal Medicine)  Extended Emergency Contact Information Primary Emergency Contact: Charlyne Quale Address: [address]          [place], [place] [address] Johnnette Litter of [place] Phone: [phone] Work Phone: [phone] Mobile Phone: [phone] Relation: Spouse Secondary Emergency Contact: [place] of [place] Mobile Phone: [phone] Relation: Son  Code Status:  Full Code  Goals of care: Advanced Directive information Advanced Directives 04/20/2018  Does Patient Have a Medical Advance Directive? Yes  Type of Advance Directive Out of facility DNR (pink MOST or yellow form)  Does patient want to make changes to medical advance directive? No - Patient declined  Would patient like information on creating a medical advance directive? No - Patient declined  Pre-existing out of facility DNR order (yellow form or pink MOST form) -     Chief Complaint  Patient presents with  . Discharge Note    The patient is seen for discharge.  Plan is for patient to discharge home on 05/14/18 with home health.    HPI:  Pt is a 67 y.o. male seen today for a discharge visit. Plan is for him to discharge home on 05/14/18 with home health OT and PT.    He has been admitted to [facility] on 04/19/18 after a recent hospitalization. He had worsening back pains. Last May 2019, as he was getting up from bed and felt a sudden sharp pain in his lower back. He went to the hospital and was found to have acute L4 compression fracture with 60% height loss. Initially, it was decided to treat it conservatively but pain worsened. MRI showed acute L3 and L4 fracture which progressed even further since prior imaging. He had lumbar kyphoplasty on 04/16/18. He has a PMH  of stroke x2 with right-sided hemiplegia, essential hypertension, bipolar disorder, diabetes, anxiety/depression, CKD stage 3, and OSA.    Patient was admitted to this facility for short-term rehabilitation after the patient's recent hospitalization.  Patient has completed SNF rehabilitation and therapy has cleared the patient for discharge.   Past Medical History:  Diagnosis Date  . 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 Back spasm   . Chronic kidney disease (CKD), stage III (moderate) (HCC)   . Depression with anxiety   . Diabetes mellitus   . Dysarthria   . Dyslipidemia   . Hypertension   . Obesity   . OSA on CPAP   . Pulmonary emboli ([place]) 02/2014  . Stasis edema of both lower extremities   . Stroke [facility]) July 2013   left paramedian pontine, incidental right parietal subcortical. both d/t small vessel disease  . Stroke [facility]) March 2015   left basal ganglia secondary to small vessel disease   Past Surgical History:  Procedure Laterality Date  . ANAL FISSURE REPAIR    . IR KYPHO EA ADDL LEVEL THORACIC OR LUMBAR  04/16/2018  . IR KYPHO LUMBAR INC FX REDUCE BONE BX UNI/BIL CANNULATION INC/IMAGING  04/16/2018  . IR RADIOLOGIST EVAL & MGMT  05/04/2018  . TONSILLECTOMY      Allergies  Allergen Reactions  . Penicillins Hives and Other (See Comments)    Has patient had a PCN reaction causing immediate rash, facial/tongue/throat swelling, SOB or lightheadedness with hypotension: no Has patient had a PCN  reaction causing severe rash involving mucus membranes or skin necrosis: no Has patient had a PCN reaction that required hospitalization: no Has patient had a PCN reaction occurring within the last 10 years: no If all of the above answers are "NO", then may proceed with Cephalosporin use.     Outpatient Encounter Medications as of 05/13/2018  Medication Sig  . acetaminophen (TYLENOL) 325 MG tablet Take 2 tablets (650 mg total) by mouth every 6 (six) hours  as needed for mild pain (or Fever >/= 101).  Marland Kitchen amLODipine (NORVASC) 10 MG tablet Take 10 mg by mouth daily.   . ARIPiprazole (ABILIFY) 5 MG tablet Take 2.5 mg by mouth daily. Take 1/2 tablet to = 2.5 mg  . baclofen (LIORESAL) 20 MG tablet Take 1 tablet (20 mg total) by mouth 3 (three) times daily as needed for muscle spasms.  . Blood Glucose Monitoring Suppl (ONE TOUCH ULTRA 2) W/DEVICE KIT   . docusate sodium (COLACE) 100 MG capsule Take 100 mg by mouth 3 (three) times a week. M-W-F  . furosemide (LASIX) 20 MG tablet Take 20 mg by mouth daily.   . hydrALAZINE (APRESOLINE) 25 MG tablet Take 25 mg by mouth 2 (two) times daily.   . isosorbide mononitrate (IMDUR) 30 MG 24 hr tablet Take 30 mg by mouth daily.   Marland Kitchen lidocaine (LIDODERM) 5 % Place 1 patch onto the skin daily. Remove & Discard patch within 12 hours or as directed by MD  . Menthol, Topical Analgesic, (BIOFREEZE) 4 % GEL Apply 1 application topically 2 (two) times daily as needed (Pain). Apply to joints  . nystatin (MYCOSTATIN/NYSTOP) powder Apply topically 2 (two) times daily.  Marland Kitchen olmesartan (BENICAR) 5 MG tablet Take 5 mg by mouth daily.   . ondansetron (ZOFRAN) 4 MG tablet Take 4 mg by mouth every 6 (six) hours as needed for nausea or vomiting.   . ONE TOUCH ULTRA TEST test strip   . ONETOUCH DELICA LANCETS 62B MISC   . pioglitazone (ACTOS) 15 MG tablet Take 7.5 mg by mouth daily.   . potassium chloride SA (K-DUR,KLOR-CON) 20 MEQ tablet Take 20 mEq by mouth daily.  . sertraline (ZOLOFT) 100 MG tablet Take 150 mg by mouth daily.   . simvastatin (ZOCOR) 20 MG tablet Take 1 tablet (20 mg total) by mouth daily at 6 PM.  . traMADol (ULTRAM) 50 MG tablet Take 2 tablets (100 mg total) by mouth every 6 (six) hours as needed for moderate pain.  . Vitamin D, Ergocalciferol, (DRISDOL) 50000 UNITS CAPS capsule Take 50,000 Units by mouth every Friday.   . warfarin (COUMADIN) 2.5 MG tablet Take 2.5-5 mg by mouth See admin instructions. Take 1 tablet  M-Tue-Thu-Sat-Sun, take 2 tablets Wed and Friday.   No facility-administered encounter medications on file as of 05/13/2018.     Review of Systems  GENERAL: No change in appetite, no fatigue, no weight changes, no fever, chills or weakness MOUTH and THROAT: Denies oral discomfort, gingival pain or bleeding RESPIRATORY: no cough, SOB, DOE, wheezing, hemoptysis CARDIAC: No chest pain, edema or palpitations GI: No abdominal pain, diarrhea, constipation, heart burn, nausea or vomiting PSYCHIATRIC: Denies feelings of depression or anxiety. No report of hallucinations, insomnia, paranoia, or agitation.   Immunization History  Administered Date(s) Administered  . Influenza Split 08/17/2013  . Pneumococcal-Unspecified 01/15/2014   Pertinent  Health Maintenance Due  Topic Date Due  . FOOT EXAM  05/20/1961  . OPHTHALMOLOGY EXAM  05/20/1961  . COLONOSCOPY  05/20/2001  .  HEMOGLOBIN A1C  10/23/2014  . PNA vac Low Risk Adult (1 of 2 - PCV13) 05/20/2016  . INFLUENZA VACCINE  06/17/2018   Fall Risk  03/24/2017 01/22/2016  Falls in the past year? No No      Vitals:   05/13/18 1016  BP: 137/78  Pulse: 66  Resp: 18  Temp: 98.4 F (36.9 C)  TempSrc: Oral  SpO2: 99%  Weight: 232 lb (105.2 kg)  Height: '5\' 7"'  (1.702 m)   Body mass index is 36.34 kg/m.  Physical Exam  GENERAL APPEARANCE: Well nourished. In no acute distress. Obese SKIN:  Skin is warm and dry.  MOUTH and THROAT: Lips are without lesions. Oral mucosa is moist and without lesions. Tongue is normal in shape, size, and color and without lesions RESPIRATORY: Breathing is even & unlabored, BS CTAB CARDIAC: RRR, no murmur,no extra heart sounds, no edema GI: Abdomen soft, normal BS, no masses, no tenderness EXTREMITIES:  Able to move LUE and BLE, right foot AFO NEUROLOGICAL: Spastic right hemiplegia. Speech is clear PSYCHIATRIC: Alert and oriented X 3. Affect and behavior are appropriate   Labs reviewed: 05/12/18  Glucose  107,  Calcium 8.9  Creatinine 1.09,  BUN 15.5  Na 142  K 3.9   eGFR 69.87  WBC 5.3  hgb 13.7  hct 41.2   mcv 88.1  Platelet 163  Recent Labs    04/13/18 0501 04/16/18 0006 04/19/18 0412  NA 145 140 138  K 4.3 3.8 3.6  CL 108 106 107  CO2 '24 27 22  ' GLUCOSE 117* 161* 136*  BUN 21* 32* 30*  CREATININE 1.31* 1.20 1.16  CALCIUM 9.2 8.8* 8.5*   Recent Labs    03/25/18 0549  AST 18  ALT 20  ALKPHOS 85  BILITOT 0.7  PROT 7.3  ALBUMIN 3.5   Recent Labs    03/24/18 0210  04/17/18 0108 04/18/18 0358 04/19/18 0412  WBC 7.9   < > 12.4* 9.2 12.1*  NEUTROABS 5.0  --   --   --   --   HGB 16.4   < > 14.8 15.4 15.2  HCT 46.4   < > 42.7 43.8 42.9  MCV 88.4   < > 85.7 85.0 84.4  PLT 177   < > 202 181 177   < > = values in this interval not displayed.   Lab Results  Component Value Date   TSH 3.840 04/22/2014   Lab Results  Component Value Date   HGBA1C 6.9 (H) 04/23/2014   Lab Results  Component Value Date   CHOL 180 04/23/2014   HDL 35 (L) 04/23/2014   LDLCALC 100 (H) 04/23/2014   TRIG 224 (H) 04/23/2014   CHOLHDL 5.1 04/23/2014    Significant Diagnostic Results in last 30 days:  Mr Lumbar Spine Wo Contrast  Result Date: 04/13/2018 CLINICAL DATA:  Worsening low back pain. The patient suffered an L4 compression fracture in a fall 03/16/2018. EXAM: MRI LUMBAR SPINE WITHOUT CONTRAST TECHNIQUE: Multiplanar, multisequence MR imaging of the lumbar spine was performed. No intravenous contrast was administered. COMPARISON:  CT lumbar spine 04/12/2018. CT abdomen and pelvis 03/24/2018 and 05/13/2016. FINDINGS: Segmentation:  Standard. Alignment:  Maintained. Vertebrae: Remote L1 compression fracture is identified as seen on all of the prior examinations. The patient also has a remote L2 compression fracture which is seen on the 05/13/2016 CT. There are acute or subacute compression fractures of L3 and L4 with marrow edema in both vertebral bodies. Vertebral body height  loss at L3 is  approximately 40%. There is near vertebra plana deformity of the central aspect of L4. Vertebral body height loss has not progressed at either level since the most recent CT scan. Marrow edema in both vertebral bodies is more extensive in L4. No other fracture is identified. Marrow signal is otherwise unremarkable. Conus medullaris and cauda equina: Conus extends to the L1 level. Conus and cauda equina appear normal. Paraspinal and other soft tissues: Small left renal cyst is noted in seen on prior exams. Disc levels: T11-12 is imaged in the sagittal plane only and negative. T12-L1: No bony retropulsion off L1. No disc bulge or protrusion. The central canal and foramina are open. L1-2: Minimal disc bulge without stenosis. L2-3: Negative. L3-4: Shallow disc bulge. Also seen is bony retropulsion off the posterior margin of L4 resulting in mild to moderate central canal stenosis in conjunction with ligamentum flavum thickening. The foramina are open. L4-5: Minimal disc bulge and mild facet degenerative disease without stenosis. L5-S1: Epidural fat is prominent. Mild facet arthropathy. Minimal disc bulge without stenosis. IMPRESSION: The examination is positive for acute or subacute compression fractures of L3 and L4 with vertebral body height loss at L3 of approximately 40% and near vertebra plana deformity centrally of the central aspect L4, unchanged since the most recent exam. Bony retropulsion off the posterior aspect of L4 causes mild to moderate central canal stenosis. There is no retropulsion at L3. Remote L1 and L2 compression fractures. Mild degenerative disc disease as described above. Electronically Signed   By: Inge Rise M.D.   On: 04/13/2018 15:03   Ir Kypho Lumbar Inc Fx Reduce Bone Bx Uni/bil Cannulation Inc/imaging  Result Date: 04/16/2018 CLINICAL DATA:  Painful subacute lumbar L3 and L4 compression fracture deformities. EXAM: KYPHOPLASTY AT LUMBAR L3 KYPHOPLASTY AT LUMBAR L4 TECHNIQUE: The  procedure, risks (including but not limited to bleeding, infection, organ damage), benefits, and alternatives were explained to the patient. Questions regarding the procedure were encouraged and answered. The patient understands and consents to the procedure. The patient was placed prone on the fluoroscopic table. The skin overlying the LUMBAR region was then prepped and draped in the usual sterile fashion. Maximal barrier sterile technique was utilized including caps, mask, sterile gowns, sterile gloves, sterile drape, hand hygiene and skin antiseptic. Intravenous Fentanyl and Versed were administered as conscious sedation during continuous cardiorespiratory monitoring by the radiology RN, with a total moderate sedation time of 42 minutes. As antibiotic prophylaxis, VANCOMYCIN 1.5 G was ordered pre-procedure and administered intravenously within !one hour! of incision. The right pedicle at LUMBAR L4 was then infiltrated with 1% lidocaine followed by the advancement of a Kyphon trocar needle through the right pedicle into the posterior one-third. The trocar was removed and the osteo drill was advanced to the anterior third of the vertebral body. The osteo drill was retracted. Through the working cannula, a Kyphon inflatable bone tamp 15 x 2 was advanced and positioned with the distal marker 5 mm from the anterior aspect of the cortex. Crossing of the midline was not seen on the AP projection. At this time, the balloon was expanded using contrast via a Kyphon inflation syringe device via micro tubing. In similar fashion, the left pedicle was infiltrated with 1% lidocaine followed by the advancement of a second Kyphon trocar needle through the left pedicle into the posterior third of the vertebral body. The osteo drill was coaxially advanced to the anterior right third. The osteo drill was exchanged for a Kyphon  inflatable bone tamp 15 x 2, advanced to the 5 mm of the anterior aspect of the cortex. The balloon was  then expanded using contrast as above. Inflations were continued until there was near apposition across the midline and with the superior and the inferior end plates. In similar fashion at lumbar L3, was then infiltrated with 1% lidocaine followed by the advancement of a Kyphon trocar needle through the right pedicle into the posterior one-third. The trocar was removed and the osteo drill was advanced to the anterior third of the vertebral body. The osteo drill was retracted. Through the working cannula, a Kyphon inflatable bone tamp 15 x 2 was advanced and positioned with the distal marker 5 mm from the anterior aspect of the cortex. Crossing of the midline was seen on the AP projection. At this time, the balloon was expanded using contrast via a Kyphon inflation syringe device via micro tubing. At this time, methylmethacrylate mixture was reconstituted in the Kyphon bone mixing device system. This was then loaded into the delivery mechanism, attached to Kyphon bone fillers. The balloons were deflated and removed followed by the instillation of methylmethacrylate mixture with excellent filling in the AP and lateral projections at both levels. No extravasation was noted in the disk spaces or posteriorly into the spinal canal. No epidural venous contamination was seen. The patient tolerated the procedure well. There were no acute complications. The working cannulae and the bone filler were then retrieved and removed. COMPLICATIONS: COMPLICATIONS None immediate. IMPRESSION: 1. Status post vertebral body augmentation using balloon kyphoplasty at lumbar L3 as described without event. 2. Status post vertebral body augmentation using balloon kyphoplasty at lumbar L4 as described without event. 3. Per CMS PQRS reporting requirements (PQRS Measure 24): Given the patient's age of greater than 80 and the fracture site (hip, distal radius, or spine), the patient should be tested for osteoporosis using DXA, and the appropriate  treatment considered based on the DXA results. Electronically Signed   By: Lucrezia Europe M.D.   On: 04/16/2018 16:56   Ir Kypho Ea Addl Level Thoracic Or Lumbar  Result Date: 04/16/2018 CLINICAL DATA:  Painful subacute lumbar L3 and L4 compression fracture deformities. EXAM: KYPHOPLASTY AT LUMBAR L3 KYPHOPLASTY AT LUMBAR L4 TECHNIQUE: The procedure, risks (including but not limited to bleeding, infection, organ damage), benefits, and alternatives were explained to the patient. Questions regarding the procedure were encouraged and answered. The patient understands and consents to the procedure. The patient was placed prone on the fluoroscopic table. The skin overlying the LUMBAR region was then prepped and draped in the usual sterile fashion. Maximal barrier sterile technique was utilized including caps, mask, sterile gowns, sterile gloves, sterile drape, hand hygiene and skin antiseptic. Intravenous Fentanyl and Versed were administered as conscious sedation during continuous cardiorespiratory monitoring by the radiology RN, with a total moderate sedation time of 42 minutes. As antibiotic prophylaxis, VANCOMYCIN 1.5 G was ordered pre-procedure and administered intravenously within !one hour! of incision. The right pedicle at LUMBAR L4 was then infiltrated with 1% lidocaine followed by the advancement of a Kyphon trocar needle through the right pedicle into the posterior one-third. The trocar was removed and the osteo drill was advanced to the anterior third of the vertebral body. The osteo drill was retracted. Through the working cannula, a Kyphon inflatable bone tamp 15 x 2 was advanced and positioned with the distal marker 5 mm from the anterior aspect of the cortex. Crossing of the midline was not seen on the AP  projection. At this time, the balloon was expanded using contrast via a Kyphon inflation syringe device via micro tubing. In similar fashion, the left pedicle was infiltrated with 1% lidocaine followed  by the advancement of a second Kyphon trocar needle through the left pedicle into the posterior third of the vertebral body. The osteo drill was coaxially advanced to the anterior right third. The osteo drill was exchanged for a Kyphon inflatable bone tamp 15 x 2, advanced to the 5 mm of the anterior aspect of the cortex. The balloon was then expanded using contrast as above. Inflations were continued until there was near apposition across the midline and with the superior and the inferior end plates. In similar fashion at lumbar L3, was then infiltrated with 1% lidocaine followed by the advancement of a Kyphon trocar needle through the right pedicle into the posterior one-third. The trocar was removed and the osteo drill was advanced to the anterior third of the vertebral body. The osteo drill was retracted. Through the working cannula, a Kyphon inflatable bone tamp 15 x 2 was advanced and positioned with the distal marker 5 mm from the anterior aspect of the cortex. Crossing of the midline was seen on the AP projection. At this time, the balloon was expanded using contrast via a Kyphon inflation syringe device via micro tubing. At this time, methylmethacrylate mixture was reconstituted in the Kyphon bone mixing device system. This was then loaded into the delivery mechanism, attached to Kyphon bone fillers. The balloons were deflated and removed followed by the instillation of methylmethacrylate mixture with excellent filling in the AP and lateral projections at both levels. No extravasation was noted in the disk spaces or posteriorly into the spinal canal. No epidural venous contamination was seen. The patient tolerated the procedure well. There were no acute complications. The working cannulae and the bone filler were then retrieved and removed. COMPLICATIONS: COMPLICATIONS None immediate. IMPRESSION: 1. Status post vertebral body augmentation using balloon kyphoplasty at lumbar L3 as described without event. 2.  Status post vertebral body augmentation using balloon kyphoplasty at lumbar L4 as described without event. 3. Per CMS PQRS reporting requirements (PQRS Measure 24): Given the patient's age of greater than 34 and the fracture site (hip, distal radius, or spine), the patient should be tested for osteoporosis using DXA, and the appropriate treatment considered based on the DXA results. Electronically Signed   By: Lucrezia Europe M.D.   On: 04/16/2018 16:56   Dg Hips Bilat With Pelvis 2v  Result Date: 04/13/2018 CLINICAL DATA:  Worsening hip pain EXAM: DG HIP (WITH OR WITHOUT PELVIS) 2V BILAT COMPARISON:  None. FINDINGS: Mild spurring in the hip joints bilaterally. No fracture, subluxation or dislocation. SI joints are symmetric and unremarkable. 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 No acute bony abnormality. Electronically Signed   By: Rolm Baptise M.D.   On: 04/13/2018 11:47   Ir Radiologist Eval & Mgmt  Result Date: 05/04/2018 Please refer to notes tab for details about interventional procedure. (Op Note)   Assessment/Plan  1. Closed compression fracture of fourth lumbar vertebra, sequel - for Home health PT and OT for therapeutic strengthening exercises, fall precautions, followed up with Interventional Radiology on 05/04/18  - baclofen (LIORESAL) 20 MG tablet; Take 1 tablet (20 mg total) by mouth 3 (three) times daily as needed for muscle spasms.  Dispense: 30 each; Refill: 0 - lidocaine (LIDODERM) 5 %; Place 1 patch onto the skin daily. Remove & Discard patch within 12 hours  or as directed by MD  Dispense: 30 patch; Refill: 0 - Menthol, Topical Analgesic, (BIOFREEZE) 4 % GEL; Apply 1 application topically 2 (two) times daily as needed (Pain). Apply to joints  Dispense: 89 mL; Refill: 0 - traMADol (ULTRAM) 50 MG tablet; Take 1 tablet (50 mg total) by mouth 2 (two) times daily as needed for moderate pain.  Dispense: 30 tablet; Refill: 0  2. Essential hypertension  -  amLODipine (NORVASC) 10 MG tablet; Take 1 tablet (10 mg total) by mouth daily.  Dispense: 30 tablet; Refill: 0 - furosemide (LASIX) 20 MG tablet; Take 1 tablet (20 mg total) by mouth daily.  Dispense: 30 tablet; Refill: 0 - hydrALAZINE (APRESOLINE) 25 MG tablet; Take 1 tablet (25 mg total) by mouth 2 (two) times daily.  Dispense: 60 tablet; Refill: 0 - isosorbide mononitrate (IMDUR) 30 MG 24 hr tablet; Take 1 tablet (30 mg total) by mouth daily.  Dispense: 30 tablet; Refill: 0 - olmesartan (BENICAR) 5 MG tablet; Take 1 tablet (5 mg total) by mouth daily.  Dispense: 30 tablet; Refill: 0 - potassium chloride SA (K-DUR,KLOR-CON) 20 MEQ tablet; Take 1 tablet (20 mEq total) by mouth daily.  Dispense: 30 tablet; Refill: 0  3. H/O: CVA (cerebrovascular accident)  - amLODipine (NORVASC) 10 MG tablet; Take 1 tablet (10 mg total) by mouth daily.  Dispense: 30 tablet; Refill: 0 - furosemide (LASIX) 20 MG tablet; Take 1 tablet (20 mg total) by mouth daily.  Dispense: 30 tablet; Refill: 0 - hydrALAZINE (APRESOLINE) 25 MG tablet; Take 1 tablet (25 mg total) by mouth 2 (two) times daily.  Dispense: 60 tablet; Refill: 0 - isosorbide mononitrate (IMDUR) 30 MG 24 hr tablet; Take 1 tablet (30 mg total) by mouth daily.  Dispense: 30 tablet; Refill: 0 - olmesartan (BENICAR) 5 MG tablet; Take 1 tablet (5 mg total) by mouth daily.  Dispense: 30 tablet; Refill: 0 - simvastatin (ZOCOR) 20 MG tablet; Take 1 tablet (20 mg total) by mouth daily at 6 PM.  Dispense: 30 tablet; Refill: 0 - warfarin (COUMADIN) 2.5 MG tablet; Take 1-2 tablets (2.5-5 mg total) by mouth See admin instructions. Take 1 tablet M-Tue-Thu-Sat-Sun, take 2 tablets Wed and Friday.  Dispense: 14 tablet; Refill: 0  4. Type 2 diabetes mellitus with complication, without long-term current use of insulin (HCC) Lab Results  Component Value Date   HGBA1C 6.9 (H) 04/23/2014   - glucose blood (ONE TOUCH ULTRA TEST) test strip; Use each time you check your blood  sugar.  Dispense: 100 each; Refill: 0 - ONETOUCH DELICA LANCETS 76K MISC; 1 each by Does not apply route daily as needed.  Dispense: 100 each; Refill: 0 - pioglitazone (ACTOS) 15 MG tablet; Take 0.5 tablets (7.5 mg total) by mouth daily.  Dispense: 15 tablet; Refill: 0  -  5. Bipolar affective disorder, remission status unspecified ([place])  - follows-up with Dr. Rosana Berger, psychiatrist  - ARIPiprazole (ABILIFY) 5 MG tablet; Take 0.5 tablets (2.5 mg total) by mouth daily. Take 1/2 tablet to = 2.5 mg  Dispense: 15 tablet; Refill: 0 - sertraline (ZOLOFT) 100 MG tablet; Take 1.5 tablets (150 mg total) by mouth daily.  Dispense: 45 tablet; Refill: 0  6. History of pulmonary embolism -   - warfarin (COUMADIN) 2.5 MG tablet; Take 1-2 tablets (2.5-5 mg total) by mouth See admin instructions. Take 1 tablet M-Tue-Thu-Sat-Sun, take 2 tablets Wed and Friday.  Dispense: 14 tablet; Refill: 0    I have filled out patient's discharge paperwork and  written prescriptions.  Patient will receive home health PT and OT.  DME provided: None  Total discharge time: Greater than 30 minutes Greater than 50% was spent in counseling and coordination of care.   Discharge time involved coordination of the discharge process with social worker, nursing staff and therapy department. Medical justification for home health services verified.    Durenda Age, NP [facility] [phone] (Monday-Friday 8:00 a.m. - 5:00 p.m.) [phone] (after hours)

## 2018-05-17 DIAGNOSIS — I69351 Hemiplegia and hemiparesis following cerebral infarction affecting right dominant side: Secondary | ICD-10-CM | POA: Diagnosis not present

## 2018-05-17 DIAGNOSIS — I129 Hypertensive chronic kidney disease with stage 1 through stage 4 chronic kidney disease, or unspecified chronic kidney disease: Secondary | ICD-10-CM | POA: Diagnosis not present

## 2018-05-17 DIAGNOSIS — F418 Other specified anxiety disorders: Secondary | ICD-10-CM | POA: Diagnosis not present

## 2018-05-17 DIAGNOSIS — G4733 Obstructive sleep apnea (adult) (pediatric): Secondary | ICD-10-CM | POA: Diagnosis not present

## 2018-05-17 DIAGNOSIS — E785 Hyperlipidemia, unspecified: Secondary | ICD-10-CM | POA: Diagnosis not present

## 2018-05-17 DIAGNOSIS — M4856XD Collapsed vertebra, not elsewhere classified, lumbar region, subsequent encounter for fracture with routine healing: Secondary | ICD-10-CM | POA: Diagnosis not present

## 2018-05-17 DIAGNOSIS — N183 Chronic kidney disease, stage 3 (moderate): Secondary | ICD-10-CM | POA: Diagnosis not present

## 2018-05-17 DIAGNOSIS — F319 Bipolar disorder, unspecified: Secondary | ICD-10-CM | POA: Diagnosis not present

## 2018-05-17 DIAGNOSIS — E1122 Type 2 diabetes mellitus with diabetic chronic kidney disease: Secondary | ICD-10-CM | POA: Diagnosis not present

## 2018-05-24 DIAGNOSIS — F418 Other specified anxiety disorders: Secondary | ICD-10-CM | POA: Diagnosis not present

## 2018-05-24 DIAGNOSIS — I129 Hypertensive chronic kidney disease with stage 1 through stage 4 chronic kidney disease, or unspecified chronic kidney disease: Secondary | ICD-10-CM | POA: Diagnosis not present

## 2018-05-24 DIAGNOSIS — I6789 Other cerebrovascular disease: Secondary | ICD-10-CM | POA: Diagnosis not present

## 2018-05-24 DIAGNOSIS — I69359 Hemiplegia and hemiparesis following cerebral infarction affecting unspecified side: Secondary | ICD-10-CM | POA: Diagnosis not present

## 2018-05-24 DIAGNOSIS — F319 Bipolar disorder, unspecified: Secondary | ICD-10-CM | POA: Diagnosis not present

## 2018-05-24 DIAGNOSIS — E119 Type 2 diabetes mellitus without complications: Secondary | ICD-10-CM | POA: Diagnosis not present

## 2018-05-24 DIAGNOSIS — G4733 Obstructive sleep apnea (adult) (pediatric): Secondary | ICD-10-CM | POA: Diagnosis not present

## 2018-05-24 DIAGNOSIS — E785 Hyperlipidemia, unspecified: Secondary | ICD-10-CM | POA: Diagnosis not present

## 2018-05-24 DIAGNOSIS — G9340 Encephalopathy, unspecified: Secondary | ICD-10-CM | POA: Diagnosis not present

## 2018-05-24 DIAGNOSIS — N183 Chronic kidney disease, stage 3 (moderate): Secondary | ICD-10-CM | POA: Diagnosis not present

## 2018-05-24 DIAGNOSIS — I69351 Hemiplegia and hemiparesis following cerebral infarction affecting right dominant side: Secondary | ICD-10-CM | POA: Diagnosis not present

## 2018-05-24 DIAGNOSIS — E1122 Type 2 diabetes mellitus with diabetic chronic kidney disease: Secondary | ICD-10-CM | POA: Diagnosis not present

## 2018-05-24 DIAGNOSIS — M4856XD Collapsed vertebra, not elsewhere classified, lumbar region, subsequent encounter for fracture with routine healing: Secondary | ICD-10-CM | POA: Diagnosis not present

## 2018-05-25 DIAGNOSIS — I69151 Hemiplegia and hemiparesis following nontraumatic intracerebral hemorrhage affecting right dominant side: Secondary | ICD-10-CM | POA: Diagnosis not present

## 2018-05-25 DIAGNOSIS — M545 Low back pain: Secondary | ICD-10-CM | POA: Diagnosis not present

## 2018-05-25 DIAGNOSIS — I2782 Chronic pulmonary embolism: Secondary | ICD-10-CM | POA: Diagnosis not present

## 2018-05-25 DIAGNOSIS — S32040S Wedge compression fracture of fourth lumbar vertebra, sequela: Secondary | ICD-10-CM | POA: Diagnosis not present

## 2018-05-25 DIAGNOSIS — I10 Essential (primary) hypertension: Secondary | ICD-10-CM | POA: Diagnosis not present

## 2018-05-25 DIAGNOSIS — Z7901 Long term (current) use of anticoagulants: Secondary | ICD-10-CM | POA: Diagnosis not present

## 2018-05-27 DIAGNOSIS — I129 Hypertensive chronic kidney disease with stage 1 through stage 4 chronic kidney disease, or unspecified chronic kidney disease: Secondary | ICD-10-CM | POA: Diagnosis not present

## 2018-05-27 DIAGNOSIS — E1122 Type 2 diabetes mellitus with diabetic chronic kidney disease: Secondary | ICD-10-CM | POA: Diagnosis not present

## 2018-05-27 DIAGNOSIS — G4733 Obstructive sleep apnea (adult) (pediatric): Secondary | ICD-10-CM | POA: Diagnosis not present

## 2018-05-27 DIAGNOSIS — I69351 Hemiplegia and hemiparesis following cerebral infarction affecting right dominant side: Secondary | ICD-10-CM | POA: Diagnosis not present

## 2018-05-27 DIAGNOSIS — M4856XD Collapsed vertebra, not elsewhere classified, lumbar region, subsequent encounter for fracture with routine healing: Secondary | ICD-10-CM | POA: Diagnosis not present

## 2018-05-27 DIAGNOSIS — E785 Hyperlipidemia, unspecified: Secondary | ICD-10-CM | POA: Diagnosis not present

## 2018-05-27 DIAGNOSIS — F418 Other specified anxiety disorders: Secondary | ICD-10-CM | POA: Diagnosis not present

## 2018-05-27 DIAGNOSIS — F319 Bipolar disorder, unspecified: Secondary | ICD-10-CM | POA: Diagnosis not present

## 2018-05-27 DIAGNOSIS — N183 Chronic kidney disease, stage 3 (moderate): Secondary | ICD-10-CM | POA: Diagnosis not present

## 2018-06-02 DIAGNOSIS — F418 Other specified anxiety disorders: Secondary | ICD-10-CM | POA: Diagnosis not present

## 2018-06-02 DIAGNOSIS — G8929 Other chronic pain: Secondary | ICD-10-CM | POA: Diagnosis not present

## 2018-06-02 DIAGNOSIS — I69351 Hemiplegia and hemiparesis following cerebral infarction affecting right dominant side: Secondary | ICD-10-CM | POA: Diagnosis not present

## 2018-06-02 DIAGNOSIS — E1122 Type 2 diabetes mellitus with diabetic chronic kidney disease: Secondary | ICD-10-CM | POA: Diagnosis not present

## 2018-06-02 DIAGNOSIS — N183 Chronic kidney disease, stage 3 (moderate): Secondary | ICD-10-CM | POA: Diagnosis not present

## 2018-06-02 DIAGNOSIS — M545 Low back pain: Secondary | ICD-10-CM | POA: Diagnosis not present

## 2018-06-02 DIAGNOSIS — F319 Bipolar disorder, unspecified: Secondary | ICD-10-CM | POA: Diagnosis not present

## 2018-06-02 DIAGNOSIS — I129 Hypertensive chronic kidney disease with stage 1 through stage 4 chronic kidney disease, or unspecified chronic kidney disease: Secondary | ICD-10-CM | POA: Diagnosis not present

## 2018-06-02 DIAGNOSIS — M4856XD Collapsed vertebra, not elsewhere classified, lumbar region, subsequent encounter for fracture with routine healing: Secondary | ICD-10-CM | POA: Diagnosis not present

## 2018-06-04 DIAGNOSIS — F418 Other specified anxiety disorders: Secondary | ICD-10-CM | POA: Diagnosis not present

## 2018-06-04 DIAGNOSIS — N183 Chronic kidney disease, stage 3 (moderate): Secondary | ICD-10-CM | POA: Diagnosis not present

## 2018-06-04 DIAGNOSIS — E1122 Type 2 diabetes mellitus with diabetic chronic kidney disease: Secondary | ICD-10-CM | POA: Diagnosis not present

## 2018-06-04 DIAGNOSIS — M4856XD Collapsed vertebra, not elsewhere classified, lumbar region, subsequent encounter for fracture with routine healing: Secondary | ICD-10-CM | POA: Diagnosis not present

## 2018-06-04 DIAGNOSIS — F319 Bipolar disorder, unspecified: Secondary | ICD-10-CM | POA: Diagnosis not present

## 2018-06-04 DIAGNOSIS — I69351 Hemiplegia and hemiparesis following cerebral infarction affecting right dominant side: Secondary | ICD-10-CM | POA: Diagnosis not present

## 2018-06-04 DIAGNOSIS — G8929 Other chronic pain: Secondary | ICD-10-CM | POA: Diagnosis not present

## 2018-06-04 DIAGNOSIS — M545 Low back pain: Secondary | ICD-10-CM | POA: Diagnosis not present

## 2018-06-04 DIAGNOSIS — I129 Hypertensive chronic kidney disease with stage 1 through stage 4 chronic kidney disease, or unspecified chronic kidney disease: Secondary | ICD-10-CM | POA: Diagnosis not present

## 2018-06-09 DIAGNOSIS — N183 Chronic kidney disease, stage 3 (moderate): Secondary | ICD-10-CM | POA: Diagnosis not present

## 2018-06-09 DIAGNOSIS — M4856XD Collapsed vertebra, not elsewhere classified, lumbar region, subsequent encounter for fracture with routine healing: Secondary | ICD-10-CM | POA: Diagnosis not present

## 2018-06-09 DIAGNOSIS — M545 Low back pain: Secondary | ICD-10-CM | POA: Diagnosis not present

## 2018-06-09 DIAGNOSIS — G8929 Other chronic pain: Secondary | ICD-10-CM | POA: Diagnosis not present

## 2018-06-09 DIAGNOSIS — I69351 Hemiplegia and hemiparesis following cerebral infarction affecting right dominant side: Secondary | ICD-10-CM | POA: Diagnosis not present

## 2018-06-09 DIAGNOSIS — F418 Other specified anxiety disorders: Secondary | ICD-10-CM | POA: Diagnosis not present

## 2018-06-09 DIAGNOSIS — F319 Bipolar disorder, unspecified: Secondary | ICD-10-CM | POA: Diagnosis not present

## 2018-06-09 DIAGNOSIS — E1122 Type 2 diabetes mellitus with diabetic chronic kidney disease: Secondary | ICD-10-CM | POA: Diagnosis not present

## 2018-06-09 DIAGNOSIS — I129 Hypertensive chronic kidney disease with stage 1 through stage 4 chronic kidney disease, or unspecified chronic kidney disease: Secondary | ICD-10-CM | POA: Diagnosis not present

## 2018-06-10 DIAGNOSIS — F321 Major depressive disorder, single episode, moderate: Secondary | ICD-10-CM | POA: Diagnosis not present

## 2018-06-11 DIAGNOSIS — E1122 Type 2 diabetes mellitus with diabetic chronic kidney disease: Secondary | ICD-10-CM | POA: Diagnosis not present

## 2018-06-11 DIAGNOSIS — N183 Chronic kidney disease, stage 3 (moderate): Secondary | ICD-10-CM | POA: Diagnosis not present

## 2018-06-11 DIAGNOSIS — M4856XD Collapsed vertebra, not elsewhere classified, lumbar region, subsequent encounter for fracture with routine healing: Secondary | ICD-10-CM | POA: Diagnosis not present

## 2018-06-11 DIAGNOSIS — M545 Low back pain: Secondary | ICD-10-CM | POA: Diagnosis not present

## 2018-06-11 DIAGNOSIS — G8929 Other chronic pain: Secondary | ICD-10-CM | POA: Diagnosis not present

## 2018-06-11 DIAGNOSIS — I69351 Hemiplegia and hemiparesis following cerebral infarction affecting right dominant side: Secondary | ICD-10-CM | POA: Diagnosis not present

## 2018-06-11 DIAGNOSIS — F418 Other specified anxiety disorders: Secondary | ICD-10-CM | POA: Diagnosis not present

## 2018-06-11 DIAGNOSIS — F319 Bipolar disorder, unspecified: Secondary | ICD-10-CM | POA: Diagnosis not present

## 2018-06-11 DIAGNOSIS — I129 Hypertensive chronic kidney disease with stage 1 through stage 4 chronic kidney disease, or unspecified chronic kidney disease: Secondary | ICD-10-CM | POA: Diagnosis not present

## 2018-06-16 DIAGNOSIS — I129 Hypertensive chronic kidney disease with stage 1 through stage 4 chronic kidney disease, or unspecified chronic kidney disease: Secondary | ICD-10-CM | POA: Diagnosis not present

## 2018-06-16 DIAGNOSIS — F319 Bipolar disorder, unspecified: Secondary | ICD-10-CM | POA: Diagnosis not present

## 2018-06-16 DIAGNOSIS — F418 Other specified anxiety disorders: Secondary | ICD-10-CM | POA: Diagnosis not present

## 2018-06-16 DIAGNOSIS — G8929 Other chronic pain: Secondary | ICD-10-CM | POA: Diagnosis not present

## 2018-06-16 DIAGNOSIS — M4856XD Collapsed vertebra, not elsewhere classified, lumbar region, subsequent encounter for fracture with routine healing: Secondary | ICD-10-CM | POA: Diagnosis not present

## 2018-06-16 DIAGNOSIS — M545 Low back pain: Secondary | ICD-10-CM | POA: Diagnosis not present

## 2018-06-16 DIAGNOSIS — E1122 Type 2 diabetes mellitus with diabetic chronic kidney disease: Secondary | ICD-10-CM | POA: Diagnosis not present

## 2018-06-16 DIAGNOSIS — I69351 Hemiplegia and hemiparesis following cerebral infarction affecting right dominant side: Secondary | ICD-10-CM | POA: Diagnosis not present

## 2018-06-16 DIAGNOSIS — N183 Chronic kidney disease, stage 3 (moderate): Secondary | ICD-10-CM | POA: Diagnosis not present

## 2018-06-18 DIAGNOSIS — M4856XD Collapsed vertebra, not elsewhere classified, lumbar region, subsequent encounter for fracture with routine healing: Secondary | ICD-10-CM | POA: Diagnosis not present

## 2018-06-18 DIAGNOSIS — M545 Low back pain: Secondary | ICD-10-CM | POA: Diagnosis not present

## 2018-06-18 DIAGNOSIS — F418 Other specified anxiety disorders: Secondary | ICD-10-CM | POA: Diagnosis not present

## 2018-06-18 DIAGNOSIS — F319 Bipolar disorder, unspecified: Secondary | ICD-10-CM | POA: Diagnosis not present

## 2018-06-18 DIAGNOSIS — E1122 Type 2 diabetes mellitus with diabetic chronic kidney disease: Secondary | ICD-10-CM | POA: Diagnosis not present

## 2018-06-18 DIAGNOSIS — I129 Hypertensive chronic kidney disease with stage 1 through stage 4 chronic kidney disease, or unspecified chronic kidney disease: Secondary | ICD-10-CM | POA: Diagnosis not present

## 2018-06-18 DIAGNOSIS — Z7901 Long term (current) use of anticoagulants: Secondary | ICD-10-CM | POA: Diagnosis not present

## 2018-06-18 DIAGNOSIS — G8929 Other chronic pain: Secondary | ICD-10-CM | POA: Diagnosis not present

## 2018-06-18 DIAGNOSIS — I69351 Hemiplegia and hemiparesis following cerebral infarction affecting right dominant side: Secondary | ICD-10-CM | POA: Diagnosis not present

## 2018-06-18 DIAGNOSIS — N183 Chronic kidney disease, stage 3 (moderate): Secondary | ICD-10-CM | POA: Diagnosis not present

## 2018-06-19 ENCOUNTER — Other Ambulatory Visit: Payer: Self-pay | Admitting: Adult Health

## 2018-06-19 DIAGNOSIS — I10 Essential (primary) hypertension: Secondary | ICD-10-CM

## 2018-06-22 ENCOUNTER — Other Ambulatory Visit: Payer: Self-pay | Admitting: Adult Health

## 2018-06-22 DIAGNOSIS — I129 Hypertensive chronic kidney disease with stage 1 through stage 4 chronic kidney disease, or unspecified chronic kidney disease: Secondary | ICD-10-CM | POA: Diagnosis not present

## 2018-06-22 DIAGNOSIS — E1122 Type 2 diabetes mellitus with diabetic chronic kidney disease: Secondary | ICD-10-CM | POA: Diagnosis not present

## 2018-06-22 DIAGNOSIS — G8929 Other chronic pain: Secondary | ICD-10-CM | POA: Diagnosis not present

## 2018-06-22 DIAGNOSIS — M545 Low back pain: Secondary | ICD-10-CM | POA: Diagnosis not present

## 2018-06-22 DIAGNOSIS — F319 Bipolar disorder, unspecified: Secondary | ICD-10-CM | POA: Diagnosis not present

## 2018-06-22 DIAGNOSIS — I69351 Hemiplegia and hemiparesis following cerebral infarction affecting right dominant side: Secondary | ICD-10-CM | POA: Diagnosis not present

## 2018-06-22 DIAGNOSIS — M4856XD Collapsed vertebra, not elsewhere classified, lumbar region, subsequent encounter for fracture with routine healing: Secondary | ICD-10-CM | POA: Diagnosis not present

## 2018-06-22 DIAGNOSIS — F418 Other specified anxiety disorders: Secondary | ICD-10-CM | POA: Diagnosis not present

## 2018-06-22 DIAGNOSIS — I10 Essential (primary) hypertension: Secondary | ICD-10-CM

## 2018-06-22 DIAGNOSIS — N183 Chronic kidney disease, stage 3 (moderate): Secondary | ICD-10-CM | POA: Diagnosis not present

## 2018-06-23 DIAGNOSIS — I69359 Hemiplegia and hemiparesis following cerebral infarction affecting unspecified side: Secondary | ICD-10-CM | POA: Diagnosis not present

## 2018-06-23 DIAGNOSIS — M545 Low back pain: Secondary | ICD-10-CM | POA: Diagnosis not present

## 2018-06-23 DIAGNOSIS — E119 Type 2 diabetes mellitus without complications: Secondary | ICD-10-CM | POA: Diagnosis not present

## 2018-06-23 DIAGNOSIS — I6789 Other cerebrovascular disease: Secondary | ICD-10-CM | POA: Diagnosis not present

## 2018-06-23 DIAGNOSIS — G9340 Encephalopathy, unspecified: Secondary | ICD-10-CM | POA: Diagnosis not present

## 2018-06-24 DIAGNOSIS — I6789 Other cerebrovascular disease: Secondary | ICD-10-CM | POA: Diagnosis not present

## 2018-06-24 DIAGNOSIS — E119 Type 2 diabetes mellitus without complications: Secondary | ICD-10-CM | POA: Diagnosis not present

## 2018-06-24 DIAGNOSIS — I69359 Hemiplegia and hemiparesis following cerebral infarction affecting unspecified side: Secondary | ICD-10-CM | POA: Diagnosis not present

## 2018-06-24 DIAGNOSIS — G9340 Encephalopathy, unspecified: Secondary | ICD-10-CM | POA: Diagnosis not present

## 2018-06-25 DIAGNOSIS — G8929 Other chronic pain: Secondary | ICD-10-CM | POA: Diagnosis not present

## 2018-06-25 DIAGNOSIS — I69351 Hemiplegia and hemiparesis following cerebral infarction affecting right dominant side: Secondary | ICD-10-CM | POA: Diagnosis not present

## 2018-06-25 DIAGNOSIS — I129 Hypertensive chronic kidney disease with stage 1 through stage 4 chronic kidney disease, or unspecified chronic kidney disease: Secondary | ICD-10-CM | POA: Diagnosis not present

## 2018-06-25 DIAGNOSIS — M545 Low back pain: Secondary | ICD-10-CM | POA: Diagnosis not present

## 2018-06-25 DIAGNOSIS — M4856XD Collapsed vertebra, not elsewhere classified, lumbar region, subsequent encounter for fracture with routine healing: Secondary | ICD-10-CM | POA: Diagnosis not present

## 2018-06-25 DIAGNOSIS — E1122 Type 2 diabetes mellitus with diabetic chronic kidney disease: Secondary | ICD-10-CM | POA: Diagnosis not present

## 2018-06-25 DIAGNOSIS — F319 Bipolar disorder, unspecified: Secondary | ICD-10-CM | POA: Diagnosis not present

## 2018-06-25 DIAGNOSIS — N183 Chronic kidney disease, stage 3 (moderate): Secondary | ICD-10-CM | POA: Diagnosis not present

## 2018-06-25 DIAGNOSIS — F418 Other specified anxiety disorders: Secondary | ICD-10-CM | POA: Diagnosis not present

## 2018-06-29 DIAGNOSIS — M4856XD Collapsed vertebra, not elsewhere classified, lumbar region, subsequent encounter for fracture with routine healing: Secondary | ICD-10-CM | POA: Diagnosis not present

## 2018-06-29 DIAGNOSIS — I129 Hypertensive chronic kidney disease with stage 1 through stage 4 chronic kidney disease, or unspecified chronic kidney disease: Secondary | ICD-10-CM | POA: Diagnosis not present

## 2018-06-29 DIAGNOSIS — F319 Bipolar disorder, unspecified: Secondary | ICD-10-CM | POA: Diagnosis not present

## 2018-06-29 DIAGNOSIS — F418 Other specified anxiety disorders: Secondary | ICD-10-CM | POA: Diagnosis not present

## 2018-06-29 DIAGNOSIS — I69351 Hemiplegia and hemiparesis following cerebral infarction affecting right dominant side: Secondary | ICD-10-CM | POA: Diagnosis not present

## 2018-06-29 DIAGNOSIS — E1122 Type 2 diabetes mellitus with diabetic chronic kidney disease: Secondary | ICD-10-CM | POA: Diagnosis not present

## 2018-06-29 DIAGNOSIS — M545 Low back pain: Secondary | ICD-10-CM | POA: Diagnosis not present

## 2018-06-29 DIAGNOSIS — N183 Chronic kidney disease, stage 3 (moderate): Secondary | ICD-10-CM | POA: Diagnosis not present

## 2018-06-29 DIAGNOSIS — G8929 Other chronic pain: Secondary | ICD-10-CM | POA: Diagnosis not present

## 2018-07-01 DIAGNOSIS — N183 Chronic kidney disease, stage 3 (moderate): Secondary | ICD-10-CM | POA: Diagnosis not present

## 2018-07-01 DIAGNOSIS — F319 Bipolar disorder, unspecified: Secondary | ICD-10-CM | POA: Diagnosis not present

## 2018-07-01 DIAGNOSIS — Z7901 Long term (current) use of anticoagulants: Secondary | ICD-10-CM | POA: Diagnosis not present

## 2018-07-01 DIAGNOSIS — I69351 Hemiplegia and hemiparesis following cerebral infarction affecting right dominant side: Secondary | ICD-10-CM | POA: Diagnosis not present

## 2018-07-01 DIAGNOSIS — M4856XD Collapsed vertebra, not elsewhere classified, lumbar region, subsequent encounter for fracture with routine healing: Secondary | ICD-10-CM | POA: Diagnosis not present

## 2018-07-01 DIAGNOSIS — G8929 Other chronic pain: Secondary | ICD-10-CM | POA: Diagnosis not present

## 2018-07-01 DIAGNOSIS — M545 Low back pain: Secondary | ICD-10-CM | POA: Diagnosis not present

## 2018-07-01 DIAGNOSIS — E1122 Type 2 diabetes mellitus with diabetic chronic kidney disease: Secondary | ICD-10-CM | POA: Diagnosis not present

## 2018-07-01 DIAGNOSIS — I129 Hypertensive chronic kidney disease with stage 1 through stage 4 chronic kidney disease, or unspecified chronic kidney disease: Secondary | ICD-10-CM | POA: Diagnosis not present

## 2018-07-01 DIAGNOSIS — F418 Other specified anxiety disorders: Secondary | ICD-10-CM | POA: Diagnosis not present

## 2018-07-07 DIAGNOSIS — M545 Low back pain: Secondary | ICD-10-CM | POA: Diagnosis not present

## 2018-07-07 DIAGNOSIS — N183 Chronic kidney disease, stage 3 (moderate): Secondary | ICD-10-CM | POA: Diagnosis not present

## 2018-07-07 DIAGNOSIS — G8929 Other chronic pain: Secondary | ICD-10-CM | POA: Diagnosis not present

## 2018-07-07 DIAGNOSIS — F319 Bipolar disorder, unspecified: Secondary | ICD-10-CM | POA: Diagnosis not present

## 2018-07-07 DIAGNOSIS — E1122 Type 2 diabetes mellitus with diabetic chronic kidney disease: Secondary | ICD-10-CM | POA: Diagnosis not present

## 2018-07-07 DIAGNOSIS — F418 Other specified anxiety disorders: Secondary | ICD-10-CM | POA: Diagnosis not present

## 2018-07-07 DIAGNOSIS — I69351 Hemiplegia and hemiparesis following cerebral infarction affecting right dominant side: Secondary | ICD-10-CM | POA: Diagnosis not present

## 2018-07-07 DIAGNOSIS — I129 Hypertensive chronic kidney disease with stage 1 through stage 4 chronic kidney disease, or unspecified chronic kidney disease: Secondary | ICD-10-CM | POA: Diagnosis not present

## 2018-07-07 DIAGNOSIS — M4856XD Collapsed vertebra, not elsewhere classified, lumbar region, subsequent encounter for fracture with routine healing: Secondary | ICD-10-CM | POA: Diagnosis not present

## 2018-07-08 DIAGNOSIS — N183 Chronic kidney disease, stage 3 (moderate): Secondary | ICD-10-CM | POA: Diagnosis not present

## 2018-07-08 DIAGNOSIS — G8929 Other chronic pain: Secondary | ICD-10-CM | POA: Diagnosis not present

## 2018-07-08 DIAGNOSIS — I69351 Hemiplegia and hemiparesis following cerebral infarction affecting right dominant side: Secondary | ICD-10-CM | POA: Diagnosis not present

## 2018-07-08 DIAGNOSIS — I129 Hypertensive chronic kidney disease with stage 1 through stage 4 chronic kidney disease, or unspecified chronic kidney disease: Secondary | ICD-10-CM | POA: Diagnosis not present

## 2018-07-08 DIAGNOSIS — M545 Low back pain: Secondary | ICD-10-CM | POA: Diagnosis not present

## 2018-07-08 DIAGNOSIS — F418 Other specified anxiety disorders: Secondary | ICD-10-CM | POA: Diagnosis not present

## 2018-07-08 DIAGNOSIS — E1122 Type 2 diabetes mellitus with diabetic chronic kidney disease: Secondary | ICD-10-CM | POA: Diagnosis not present

## 2018-07-08 DIAGNOSIS — M4856XD Collapsed vertebra, not elsewhere classified, lumbar region, subsequent encounter for fracture with routine healing: Secondary | ICD-10-CM | POA: Diagnosis not present

## 2018-07-08 DIAGNOSIS — F319 Bipolar disorder, unspecified: Secondary | ICD-10-CM | POA: Diagnosis not present

## 2018-07-12 DIAGNOSIS — F319 Bipolar disorder, unspecified: Secondary | ICD-10-CM | POA: Diagnosis not present

## 2018-07-12 DIAGNOSIS — M4856XD Collapsed vertebra, not elsewhere classified, lumbar region, subsequent encounter for fracture with routine healing: Secondary | ICD-10-CM | POA: Diagnosis not present

## 2018-07-12 DIAGNOSIS — I69351 Hemiplegia and hemiparesis following cerebral infarction affecting right dominant side: Secondary | ICD-10-CM | POA: Diagnosis not present

## 2018-07-12 DIAGNOSIS — F418 Other specified anxiety disorders: Secondary | ICD-10-CM | POA: Diagnosis not present

## 2018-07-12 DIAGNOSIS — G8929 Other chronic pain: Secondary | ICD-10-CM | POA: Diagnosis not present

## 2018-07-12 DIAGNOSIS — N183 Chronic kidney disease, stage 3 (moderate): Secondary | ICD-10-CM | POA: Diagnosis not present

## 2018-07-12 DIAGNOSIS — I129 Hypertensive chronic kidney disease with stage 1 through stage 4 chronic kidney disease, or unspecified chronic kidney disease: Secondary | ICD-10-CM | POA: Diagnosis not present

## 2018-07-12 DIAGNOSIS — M545 Low back pain: Secondary | ICD-10-CM | POA: Diagnosis not present

## 2018-07-12 DIAGNOSIS — E1122 Type 2 diabetes mellitus with diabetic chronic kidney disease: Secondary | ICD-10-CM | POA: Diagnosis not present

## 2018-07-14 DIAGNOSIS — I69351 Hemiplegia and hemiparesis following cerebral infarction affecting right dominant side: Secondary | ICD-10-CM | POA: Diagnosis not present

## 2018-07-14 DIAGNOSIS — N183 Chronic kidney disease, stage 3 (moderate): Secondary | ICD-10-CM | POA: Diagnosis not present

## 2018-07-14 DIAGNOSIS — M545 Low back pain: Secondary | ICD-10-CM | POA: Diagnosis not present

## 2018-07-14 DIAGNOSIS — F319 Bipolar disorder, unspecified: Secondary | ICD-10-CM | POA: Diagnosis not present

## 2018-07-14 DIAGNOSIS — I129 Hypertensive chronic kidney disease with stage 1 through stage 4 chronic kidney disease, or unspecified chronic kidney disease: Secondary | ICD-10-CM | POA: Diagnosis not present

## 2018-07-14 DIAGNOSIS — M4856XD Collapsed vertebra, not elsewhere classified, lumbar region, subsequent encounter for fracture with routine healing: Secondary | ICD-10-CM | POA: Diagnosis not present

## 2018-07-14 DIAGNOSIS — G8929 Other chronic pain: Secondary | ICD-10-CM | POA: Diagnosis not present

## 2018-07-14 DIAGNOSIS — E1122 Type 2 diabetes mellitus with diabetic chronic kidney disease: Secondary | ICD-10-CM | POA: Diagnosis not present

## 2018-07-14 DIAGNOSIS — F418 Other specified anxiety disorders: Secondary | ICD-10-CM | POA: Diagnosis not present

## 2018-07-21 DIAGNOSIS — E1122 Type 2 diabetes mellitus with diabetic chronic kidney disease: Secondary | ICD-10-CM | POA: Diagnosis not present

## 2018-07-21 DIAGNOSIS — I129 Hypertensive chronic kidney disease with stage 1 through stage 4 chronic kidney disease, or unspecified chronic kidney disease: Secondary | ICD-10-CM | POA: Diagnosis not present

## 2018-07-21 DIAGNOSIS — M545 Low back pain: Secondary | ICD-10-CM | POA: Diagnosis not present

## 2018-07-21 DIAGNOSIS — F418 Other specified anxiety disorders: Secondary | ICD-10-CM | POA: Diagnosis not present

## 2018-07-21 DIAGNOSIS — F319 Bipolar disorder, unspecified: Secondary | ICD-10-CM | POA: Diagnosis not present

## 2018-07-21 DIAGNOSIS — G8929 Other chronic pain: Secondary | ICD-10-CM | POA: Diagnosis not present

## 2018-07-21 DIAGNOSIS — M4856XD Collapsed vertebra, not elsewhere classified, lumbar region, subsequent encounter for fracture with routine healing: Secondary | ICD-10-CM | POA: Diagnosis not present

## 2018-07-21 DIAGNOSIS — N183 Chronic kidney disease, stage 3 (moderate): Secondary | ICD-10-CM | POA: Diagnosis not present

## 2018-07-21 DIAGNOSIS — I69351 Hemiplegia and hemiparesis following cerebral infarction affecting right dominant side: Secondary | ICD-10-CM | POA: Diagnosis not present

## 2018-07-22 DIAGNOSIS — M545 Low back pain: Secondary | ICD-10-CM | POA: Diagnosis not present

## 2018-07-22 DIAGNOSIS — F319 Bipolar disorder, unspecified: Secondary | ICD-10-CM | POA: Diagnosis not present

## 2018-07-22 DIAGNOSIS — E1122 Type 2 diabetes mellitus with diabetic chronic kidney disease: Secondary | ICD-10-CM | POA: Diagnosis not present

## 2018-07-22 DIAGNOSIS — M4856XD Collapsed vertebra, not elsewhere classified, lumbar region, subsequent encounter for fracture with routine healing: Secondary | ICD-10-CM | POA: Diagnosis not present

## 2018-07-22 DIAGNOSIS — N183 Chronic kidney disease, stage 3 (moderate): Secondary | ICD-10-CM | POA: Diagnosis not present

## 2018-07-22 DIAGNOSIS — I129 Hypertensive chronic kidney disease with stage 1 through stage 4 chronic kidney disease, or unspecified chronic kidney disease: Secondary | ICD-10-CM | POA: Diagnosis not present

## 2018-07-22 DIAGNOSIS — G8929 Other chronic pain: Secondary | ICD-10-CM | POA: Diagnosis not present

## 2018-07-22 DIAGNOSIS — I69351 Hemiplegia and hemiparesis following cerebral infarction affecting right dominant side: Secondary | ICD-10-CM | POA: Diagnosis not present

## 2018-07-22 DIAGNOSIS — F418 Other specified anxiety disorders: Secondary | ICD-10-CM | POA: Diagnosis not present

## 2018-07-25 DIAGNOSIS — I6789 Other cerebrovascular disease: Secondary | ICD-10-CM | POA: Diagnosis not present

## 2018-07-25 DIAGNOSIS — E119 Type 2 diabetes mellitus without complications: Secondary | ICD-10-CM | POA: Diagnosis not present

## 2018-07-25 DIAGNOSIS — G9340 Encephalopathy, unspecified: Secondary | ICD-10-CM | POA: Diagnosis not present

## 2018-07-25 DIAGNOSIS — I69359 Hemiplegia and hemiparesis following cerebral infarction affecting unspecified side: Secondary | ICD-10-CM | POA: Diagnosis not present

## 2018-07-26 DIAGNOSIS — I69151 Hemiplegia and hemiparesis following nontraumatic intracerebral hemorrhage affecting right dominant side: Secondary | ICD-10-CM | POA: Diagnosis not present

## 2018-07-26 DIAGNOSIS — E1165 Type 2 diabetes mellitus with hyperglycemia: Secondary | ICD-10-CM | POA: Diagnosis not present

## 2018-07-26 DIAGNOSIS — E1122 Type 2 diabetes mellitus with diabetic chronic kidney disease: Secondary | ICD-10-CM | POA: Diagnosis not present

## 2018-07-26 DIAGNOSIS — Z7901 Long term (current) use of anticoagulants: Secondary | ICD-10-CM | POA: Diagnosis not present

## 2018-07-26 DIAGNOSIS — I10 Essential (primary) hypertension: Secondary | ICD-10-CM | POA: Diagnosis not present

## 2018-07-27 DIAGNOSIS — F418 Other specified anxiety disorders: Secondary | ICD-10-CM | POA: Diagnosis not present

## 2018-07-27 DIAGNOSIS — F319 Bipolar disorder, unspecified: Secondary | ICD-10-CM | POA: Diagnosis not present

## 2018-07-27 DIAGNOSIS — G4733 Obstructive sleep apnea (adult) (pediatric): Secondary | ICD-10-CM | POA: Diagnosis not present

## 2018-07-27 DIAGNOSIS — N183 Chronic kidney disease, stage 3 (moderate): Secondary | ICD-10-CM | POA: Diagnosis not present

## 2018-07-27 DIAGNOSIS — I129 Hypertensive chronic kidney disease with stage 1 through stage 4 chronic kidney disease, or unspecified chronic kidney disease: Secondary | ICD-10-CM | POA: Diagnosis not present

## 2018-07-27 DIAGNOSIS — G8929 Other chronic pain: Secondary | ICD-10-CM | POA: Diagnosis not present

## 2018-07-27 DIAGNOSIS — E1122 Type 2 diabetes mellitus with diabetic chronic kidney disease: Secondary | ICD-10-CM | POA: Diagnosis not present

## 2018-07-27 DIAGNOSIS — I69351 Hemiplegia and hemiparesis following cerebral infarction affecting right dominant side: Secondary | ICD-10-CM | POA: Diagnosis not present

## 2018-07-27 DIAGNOSIS — M4856XD Collapsed vertebra, not elsewhere classified, lumbar region, subsequent encounter for fracture with routine healing: Secondary | ICD-10-CM | POA: Diagnosis not present

## 2018-07-30 DIAGNOSIS — F319 Bipolar disorder, unspecified: Secondary | ICD-10-CM | POA: Diagnosis not present

## 2018-07-30 DIAGNOSIS — N183 Chronic kidney disease, stage 3 (moderate): Secondary | ICD-10-CM | POA: Diagnosis not present

## 2018-07-30 DIAGNOSIS — G8929 Other chronic pain: Secondary | ICD-10-CM | POA: Diagnosis not present

## 2018-07-30 DIAGNOSIS — M4856XD Collapsed vertebra, not elsewhere classified, lumbar region, subsequent encounter for fracture with routine healing: Secondary | ICD-10-CM | POA: Diagnosis not present

## 2018-07-30 DIAGNOSIS — I69351 Hemiplegia and hemiparesis following cerebral infarction affecting right dominant side: Secondary | ICD-10-CM | POA: Diagnosis not present

## 2018-07-30 DIAGNOSIS — F418 Other specified anxiety disorders: Secondary | ICD-10-CM | POA: Diagnosis not present

## 2018-07-30 DIAGNOSIS — E1122 Type 2 diabetes mellitus with diabetic chronic kidney disease: Secondary | ICD-10-CM | POA: Diagnosis not present

## 2018-07-30 DIAGNOSIS — G4733 Obstructive sleep apnea (adult) (pediatric): Secondary | ICD-10-CM | POA: Diagnosis not present

## 2018-07-30 DIAGNOSIS — I129 Hypertensive chronic kidney disease with stage 1 through stage 4 chronic kidney disease, or unspecified chronic kidney disease: Secondary | ICD-10-CM | POA: Diagnosis not present

## 2018-08-02 DIAGNOSIS — N183 Chronic kidney disease, stage 3 (moderate): Secondary | ICD-10-CM | POA: Diagnosis not present

## 2018-08-02 DIAGNOSIS — F319 Bipolar disorder, unspecified: Secondary | ICD-10-CM | POA: Diagnosis not present

## 2018-08-02 DIAGNOSIS — E1122 Type 2 diabetes mellitus with diabetic chronic kidney disease: Secondary | ICD-10-CM | POA: Diagnosis not present

## 2018-08-02 DIAGNOSIS — F418 Other specified anxiety disorders: Secondary | ICD-10-CM | POA: Diagnosis not present

## 2018-08-02 DIAGNOSIS — G4733 Obstructive sleep apnea (adult) (pediatric): Secondary | ICD-10-CM | POA: Diagnosis not present

## 2018-08-02 DIAGNOSIS — I69351 Hemiplegia and hemiparesis following cerebral infarction affecting right dominant side: Secondary | ICD-10-CM | POA: Diagnosis not present

## 2018-08-02 DIAGNOSIS — I129 Hypertensive chronic kidney disease with stage 1 through stage 4 chronic kidney disease, or unspecified chronic kidney disease: Secondary | ICD-10-CM | POA: Diagnosis not present

## 2018-08-02 DIAGNOSIS — G8929 Other chronic pain: Secondary | ICD-10-CM | POA: Diagnosis not present

## 2018-08-02 DIAGNOSIS — M4856XD Collapsed vertebra, not elsewhere classified, lumbar region, subsequent encounter for fracture with routine healing: Secondary | ICD-10-CM | POA: Diagnosis not present

## 2018-08-06 DIAGNOSIS — N183 Chronic kidney disease, stage 3 (moderate): Secondary | ICD-10-CM | POA: Diagnosis not present

## 2018-08-06 DIAGNOSIS — F319 Bipolar disorder, unspecified: Secondary | ICD-10-CM | POA: Diagnosis not present

## 2018-08-06 DIAGNOSIS — E1122 Type 2 diabetes mellitus with diabetic chronic kidney disease: Secondary | ICD-10-CM | POA: Diagnosis not present

## 2018-08-06 DIAGNOSIS — G4733 Obstructive sleep apnea (adult) (pediatric): Secondary | ICD-10-CM | POA: Diagnosis not present

## 2018-08-06 DIAGNOSIS — M4856XD Collapsed vertebra, not elsewhere classified, lumbar region, subsequent encounter for fracture with routine healing: Secondary | ICD-10-CM | POA: Diagnosis not present

## 2018-08-06 DIAGNOSIS — G8929 Other chronic pain: Secondary | ICD-10-CM | POA: Diagnosis not present

## 2018-08-06 DIAGNOSIS — F418 Other specified anxiety disorders: Secondary | ICD-10-CM | POA: Diagnosis not present

## 2018-08-06 DIAGNOSIS — I129 Hypertensive chronic kidney disease with stage 1 through stage 4 chronic kidney disease, or unspecified chronic kidney disease: Secondary | ICD-10-CM | POA: Diagnosis not present

## 2018-08-06 DIAGNOSIS — I69351 Hemiplegia and hemiparesis following cerebral infarction affecting right dominant side: Secondary | ICD-10-CM | POA: Diagnosis not present

## 2018-08-09 DIAGNOSIS — F418 Other specified anxiety disorders: Secondary | ICD-10-CM | POA: Diagnosis not present

## 2018-08-09 DIAGNOSIS — G8929 Other chronic pain: Secondary | ICD-10-CM | POA: Diagnosis not present

## 2018-08-09 DIAGNOSIS — I129 Hypertensive chronic kidney disease with stage 1 through stage 4 chronic kidney disease, or unspecified chronic kidney disease: Secondary | ICD-10-CM | POA: Diagnosis not present

## 2018-08-09 DIAGNOSIS — I69351 Hemiplegia and hemiparesis following cerebral infarction affecting right dominant side: Secondary | ICD-10-CM | POA: Diagnosis not present

## 2018-08-09 DIAGNOSIS — G4733 Obstructive sleep apnea (adult) (pediatric): Secondary | ICD-10-CM | POA: Diagnosis not present

## 2018-08-09 DIAGNOSIS — N183 Chronic kidney disease, stage 3 (moderate): Secondary | ICD-10-CM | POA: Diagnosis not present

## 2018-08-09 DIAGNOSIS — M4856XD Collapsed vertebra, not elsewhere classified, lumbar region, subsequent encounter for fracture with routine healing: Secondary | ICD-10-CM | POA: Diagnosis not present

## 2018-08-09 DIAGNOSIS — E1122 Type 2 diabetes mellitus with diabetic chronic kidney disease: Secondary | ICD-10-CM | POA: Diagnosis not present

## 2018-08-09 DIAGNOSIS — F319 Bipolar disorder, unspecified: Secondary | ICD-10-CM | POA: Diagnosis not present

## 2018-08-11 DIAGNOSIS — I69351 Hemiplegia and hemiparesis following cerebral infarction affecting right dominant side: Secondary | ICD-10-CM | POA: Diagnosis not present

## 2018-08-11 DIAGNOSIS — F418 Other specified anxiety disorders: Secondary | ICD-10-CM | POA: Diagnosis not present

## 2018-08-11 DIAGNOSIS — N183 Chronic kidney disease, stage 3 (moderate): Secondary | ICD-10-CM | POA: Diagnosis not present

## 2018-08-11 DIAGNOSIS — G4733 Obstructive sleep apnea (adult) (pediatric): Secondary | ICD-10-CM | POA: Diagnosis not present

## 2018-08-11 DIAGNOSIS — E1122 Type 2 diabetes mellitus with diabetic chronic kidney disease: Secondary | ICD-10-CM | POA: Diagnosis not present

## 2018-08-11 DIAGNOSIS — F319 Bipolar disorder, unspecified: Secondary | ICD-10-CM | POA: Diagnosis not present

## 2018-08-11 DIAGNOSIS — I129 Hypertensive chronic kidney disease with stage 1 through stage 4 chronic kidney disease, or unspecified chronic kidney disease: Secondary | ICD-10-CM | POA: Diagnosis not present

## 2018-08-11 DIAGNOSIS — M4856XD Collapsed vertebra, not elsewhere classified, lumbar region, subsequent encounter for fracture with routine healing: Secondary | ICD-10-CM | POA: Diagnosis not present

## 2018-08-11 DIAGNOSIS — G8929 Other chronic pain: Secondary | ICD-10-CM | POA: Diagnosis not present

## 2018-08-12 DIAGNOSIS — F321 Major depressive disorder, single episode, moderate: Secondary | ICD-10-CM | POA: Diagnosis not present

## 2018-08-18 DIAGNOSIS — I69351 Hemiplegia and hemiparesis following cerebral infarction affecting right dominant side: Secondary | ICD-10-CM | POA: Diagnosis not present

## 2018-08-18 DIAGNOSIS — M4856XD Collapsed vertebra, not elsewhere classified, lumbar region, subsequent encounter for fracture with routine healing: Secondary | ICD-10-CM | POA: Diagnosis not present

## 2018-08-18 DIAGNOSIS — N183 Chronic kidney disease, stage 3 (moderate): Secondary | ICD-10-CM | POA: Diagnosis not present

## 2018-08-18 DIAGNOSIS — E1122 Type 2 diabetes mellitus with diabetic chronic kidney disease: Secondary | ICD-10-CM | POA: Diagnosis not present

## 2018-08-18 DIAGNOSIS — F418 Other specified anxiety disorders: Secondary | ICD-10-CM | POA: Diagnosis not present

## 2018-08-18 DIAGNOSIS — I129 Hypertensive chronic kidney disease with stage 1 through stage 4 chronic kidney disease, or unspecified chronic kidney disease: Secondary | ICD-10-CM | POA: Diagnosis not present

## 2018-08-18 DIAGNOSIS — G8929 Other chronic pain: Secondary | ICD-10-CM | POA: Diagnosis not present

## 2018-08-18 DIAGNOSIS — G4733 Obstructive sleep apnea (adult) (pediatric): Secondary | ICD-10-CM | POA: Diagnosis not present

## 2018-08-18 DIAGNOSIS — F319 Bipolar disorder, unspecified: Secondary | ICD-10-CM | POA: Diagnosis not present

## 2018-08-19 DIAGNOSIS — F319 Bipolar disorder, unspecified: Secondary | ICD-10-CM | POA: Diagnosis not present

## 2018-08-19 DIAGNOSIS — G8929 Other chronic pain: Secondary | ICD-10-CM | POA: Diagnosis not present

## 2018-08-19 DIAGNOSIS — G4733 Obstructive sleep apnea (adult) (pediatric): Secondary | ICD-10-CM | POA: Diagnosis not present

## 2018-08-19 DIAGNOSIS — I69351 Hemiplegia and hemiparesis following cerebral infarction affecting right dominant side: Secondary | ICD-10-CM | POA: Diagnosis not present

## 2018-08-19 DIAGNOSIS — F418 Other specified anxiety disorders: Secondary | ICD-10-CM | POA: Diagnosis not present

## 2018-08-19 DIAGNOSIS — M4856XD Collapsed vertebra, not elsewhere classified, lumbar region, subsequent encounter for fracture with routine healing: Secondary | ICD-10-CM | POA: Diagnosis not present

## 2018-08-19 DIAGNOSIS — I129 Hypertensive chronic kidney disease with stage 1 through stage 4 chronic kidney disease, or unspecified chronic kidney disease: Secondary | ICD-10-CM | POA: Diagnosis not present

## 2018-08-19 DIAGNOSIS — N183 Chronic kidney disease, stage 3 (moderate): Secondary | ICD-10-CM | POA: Diagnosis not present

## 2018-08-19 DIAGNOSIS — E1122 Type 2 diabetes mellitus with diabetic chronic kidney disease: Secondary | ICD-10-CM | POA: Diagnosis not present

## 2018-08-20 DIAGNOSIS — Z7901 Long term (current) use of anticoagulants: Secondary | ICD-10-CM | POA: Diagnosis not present

## 2018-08-23 DIAGNOSIS — F319 Bipolar disorder, unspecified: Secondary | ICD-10-CM | POA: Diagnosis not present

## 2018-08-23 DIAGNOSIS — I129 Hypertensive chronic kidney disease with stage 1 through stage 4 chronic kidney disease, or unspecified chronic kidney disease: Secondary | ICD-10-CM | POA: Diagnosis not present

## 2018-08-23 DIAGNOSIS — M4856XD Collapsed vertebra, not elsewhere classified, lumbar region, subsequent encounter for fracture with routine healing: Secondary | ICD-10-CM | POA: Diagnosis not present

## 2018-08-23 DIAGNOSIS — G8929 Other chronic pain: Secondary | ICD-10-CM | POA: Diagnosis not present

## 2018-08-23 DIAGNOSIS — F418 Other specified anxiety disorders: Secondary | ICD-10-CM | POA: Diagnosis not present

## 2018-08-23 DIAGNOSIS — I69351 Hemiplegia and hemiparesis following cerebral infarction affecting right dominant side: Secondary | ICD-10-CM | POA: Diagnosis not present

## 2018-08-23 DIAGNOSIS — G4733 Obstructive sleep apnea (adult) (pediatric): Secondary | ICD-10-CM | POA: Diagnosis not present

## 2018-08-23 DIAGNOSIS — N183 Chronic kidney disease, stage 3 (moderate): Secondary | ICD-10-CM | POA: Diagnosis not present

## 2018-08-23 DIAGNOSIS — E1122 Type 2 diabetes mellitus with diabetic chronic kidney disease: Secondary | ICD-10-CM | POA: Diagnosis not present

## 2018-08-24 DIAGNOSIS — E119 Type 2 diabetes mellitus without complications: Secondary | ICD-10-CM | POA: Diagnosis not present

## 2018-08-24 DIAGNOSIS — G9340 Encephalopathy, unspecified: Secondary | ICD-10-CM | POA: Diagnosis not present

## 2018-08-24 DIAGNOSIS — I6789 Other cerebrovascular disease: Secondary | ICD-10-CM | POA: Diagnosis not present

## 2018-08-24 DIAGNOSIS — I69359 Hemiplegia and hemiparesis following cerebral infarction affecting unspecified side: Secondary | ICD-10-CM | POA: Diagnosis not present

## 2018-08-26 ENCOUNTER — Other Ambulatory Visit: Payer: Self-pay | Admitting: Adult Health

## 2018-08-26 DIAGNOSIS — I129 Hypertensive chronic kidney disease with stage 1 through stage 4 chronic kidney disease, or unspecified chronic kidney disease: Secondary | ICD-10-CM | POA: Diagnosis not present

## 2018-08-26 DIAGNOSIS — I10 Essential (primary) hypertension: Secondary | ICD-10-CM

## 2018-08-26 DIAGNOSIS — F418 Other specified anxiety disorders: Secondary | ICD-10-CM | POA: Diagnosis not present

## 2018-08-26 DIAGNOSIS — N183 Chronic kidney disease, stage 3 (moderate): Secondary | ICD-10-CM | POA: Diagnosis not present

## 2018-08-26 DIAGNOSIS — I69351 Hemiplegia and hemiparesis following cerebral infarction affecting right dominant side: Secondary | ICD-10-CM | POA: Diagnosis not present

## 2018-08-26 DIAGNOSIS — G8929 Other chronic pain: Secondary | ICD-10-CM | POA: Diagnosis not present

## 2018-08-26 DIAGNOSIS — G4733 Obstructive sleep apnea (adult) (pediatric): Secondary | ICD-10-CM | POA: Diagnosis not present

## 2018-08-26 DIAGNOSIS — F319 Bipolar disorder, unspecified: Secondary | ICD-10-CM | POA: Diagnosis not present

## 2018-08-26 DIAGNOSIS — E1122 Type 2 diabetes mellitus with diabetic chronic kidney disease: Secondary | ICD-10-CM | POA: Diagnosis not present

## 2018-08-26 DIAGNOSIS — M4856XD Collapsed vertebra, not elsewhere classified, lumbar region, subsequent encounter for fracture with routine healing: Secondary | ICD-10-CM | POA: Diagnosis not present

## 2018-08-30 DIAGNOSIS — F319 Bipolar disorder, unspecified: Secondary | ICD-10-CM | POA: Diagnosis not present

## 2018-08-30 DIAGNOSIS — I129 Hypertensive chronic kidney disease with stage 1 through stage 4 chronic kidney disease, or unspecified chronic kidney disease: Secondary | ICD-10-CM | POA: Diagnosis not present

## 2018-08-30 DIAGNOSIS — F418 Other specified anxiety disorders: Secondary | ICD-10-CM | POA: Diagnosis not present

## 2018-08-30 DIAGNOSIS — G4733 Obstructive sleep apnea (adult) (pediatric): Secondary | ICD-10-CM | POA: Diagnosis not present

## 2018-08-30 DIAGNOSIS — I69351 Hemiplegia and hemiparesis following cerebral infarction affecting right dominant side: Secondary | ICD-10-CM | POA: Diagnosis not present

## 2018-08-30 DIAGNOSIS — M4856XD Collapsed vertebra, not elsewhere classified, lumbar region, subsequent encounter for fracture with routine healing: Secondary | ICD-10-CM | POA: Diagnosis not present

## 2018-08-30 DIAGNOSIS — E1122 Type 2 diabetes mellitus with diabetic chronic kidney disease: Secondary | ICD-10-CM | POA: Diagnosis not present

## 2018-08-30 DIAGNOSIS — N183 Chronic kidney disease, stage 3 (moderate): Secondary | ICD-10-CM | POA: Diagnosis not present

## 2018-08-30 DIAGNOSIS — G8929 Other chronic pain: Secondary | ICD-10-CM | POA: Diagnosis not present

## 2018-09-01 DIAGNOSIS — F319 Bipolar disorder, unspecified: Secondary | ICD-10-CM | POA: Diagnosis not present

## 2018-09-01 DIAGNOSIS — E1122 Type 2 diabetes mellitus with diabetic chronic kidney disease: Secondary | ICD-10-CM | POA: Diagnosis not present

## 2018-09-01 DIAGNOSIS — I69351 Hemiplegia and hemiparesis following cerebral infarction affecting right dominant side: Secondary | ICD-10-CM | POA: Diagnosis not present

## 2018-09-01 DIAGNOSIS — I129 Hypertensive chronic kidney disease with stage 1 through stage 4 chronic kidney disease, or unspecified chronic kidney disease: Secondary | ICD-10-CM | POA: Diagnosis not present

## 2018-09-01 DIAGNOSIS — F418 Other specified anxiety disorders: Secondary | ICD-10-CM | POA: Diagnosis not present

## 2018-09-01 DIAGNOSIS — G4733 Obstructive sleep apnea (adult) (pediatric): Secondary | ICD-10-CM | POA: Diagnosis not present

## 2018-09-01 DIAGNOSIS — G8929 Other chronic pain: Secondary | ICD-10-CM | POA: Diagnosis not present

## 2018-09-01 DIAGNOSIS — M4856XD Collapsed vertebra, not elsewhere classified, lumbar region, subsequent encounter for fracture with routine healing: Secondary | ICD-10-CM | POA: Diagnosis not present

## 2018-09-01 DIAGNOSIS — N183 Chronic kidney disease, stage 3 (moderate): Secondary | ICD-10-CM | POA: Diagnosis not present

## 2018-09-08 DIAGNOSIS — F418 Other specified anxiety disorders: Secondary | ICD-10-CM | POA: Diagnosis not present

## 2018-09-08 DIAGNOSIS — M4856XD Collapsed vertebra, not elsewhere classified, lumbar region, subsequent encounter for fracture with routine healing: Secondary | ICD-10-CM | POA: Diagnosis not present

## 2018-09-08 DIAGNOSIS — F319 Bipolar disorder, unspecified: Secondary | ICD-10-CM | POA: Diagnosis not present

## 2018-09-08 DIAGNOSIS — N183 Chronic kidney disease, stage 3 (moderate): Secondary | ICD-10-CM | POA: Diagnosis not present

## 2018-09-08 DIAGNOSIS — I129 Hypertensive chronic kidney disease with stage 1 through stage 4 chronic kidney disease, or unspecified chronic kidney disease: Secondary | ICD-10-CM | POA: Diagnosis not present

## 2018-09-08 DIAGNOSIS — E1122 Type 2 diabetes mellitus with diabetic chronic kidney disease: Secondary | ICD-10-CM | POA: Diagnosis not present

## 2018-09-08 DIAGNOSIS — I69351 Hemiplegia and hemiparesis following cerebral infarction affecting right dominant side: Secondary | ICD-10-CM | POA: Diagnosis not present

## 2018-09-08 DIAGNOSIS — G4733 Obstructive sleep apnea (adult) (pediatric): Secondary | ICD-10-CM | POA: Diagnosis not present

## 2018-09-08 DIAGNOSIS — G8929 Other chronic pain: Secondary | ICD-10-CM | POA: Diagnosis not present

## 2018-09-10 DIAGNOSIS — I129 Hypertensive chronic kidney disease with stage 1 through stage 4 chronic kidney disease, or unspecified chronic kidney disease: Secondary | ICD-10-CM | POA: Diagnosis not present

## 2018-09-10 DIAGNOSIS — N183 Chronic kidney disease, stage 3 (moderate): Secondary | ICD-10-CM | POA: Diagnosis not present

## 2018-09-10 DIAGNOSIS — E1122 Type 2 diabetes mellitus with diabetic chronic kidney disease: Secondary | ICD-10-CM | POA: Diagnosis not present

## 2018-09-10 DIAGNOSIS — I69351 Hemiplegia and hemiparesis following cerebral infarction affecting right dominant side: Secondary | ICD-10-CM | POA: Diagnosis not present

## 2018-09-10 DIAGNOSIS — F319 Bipolar disorder, unspecified: Secondary | ICD-10-CM | POA: Diagnosis not present

## 2018-09-10 DIAGNOSIS — G4733 Obstructive sleep apnea (adult) (pediatric): Secondary | ICD-10-CM | POA: Diagnosis not present

## 2018-09-10 DIAGNOSIS — F418 Other specified anxiety disorders: Secondary | ICD-10-CM | POA: Diagnosis not present

## 2018-09-10 DIAGNOSIS — G8929 Other chronic pain: Secondary | ICD-10-CM | POA: Diagnosis not present

## 2018-09-10 DIAGNOSIS — M4856XD Collapsed vertebra, not elsewhere classified, lumbar region, subsequent encounter for fracture with routine healing: Secondary | ICD-10-CM | POA: Diagnosis not present

## 2018-09-14 DIAGNOSIS — F319 Bipolar disorder, unspecified: Secondary | ICD-10-CM | POA: Diagnosis not present

## 2018-09-14 DIAGNOSIS — I129 Hypertensive chronic kidney disease with stage 1 through stage 4 chronic kidney disease, or unspecified chronic kidney disease: Secondary | ICD-10-CM | POA: Diagnosis not present

## 2018-09-14 DIAGNOSIS — G8929 Other chronic pain: Secondary | ICD-10-CM | POA: Diagnosis not present

## 2018-09-14 DIAGNOSIS — M4856XD Collapsed vertebra, not elsewhere classified, lumbar region, subsequent encounter for fracture with routine healing: Secondary | ICD-10-CM | POA: Diagnosis not present

## 2018-09-14 DIAGNOSIS — F418 Other specified anxiety disorders: Secondary | ICD-10-CM | POA: Diagnosis not present

## 2018-09-14 DIAGNOSIS — N183 Chronic kidney disease, stage 3 (moderate): Secondary | ICD-10-CM | POA: Diagnosis not present

## 2018-09-14 DIAGNOSIS — I69351 Hemiplegia and hemiparesis following cerebral infarction affecting right dominant side: Secondary | ICD-10-CM | POA: Diagnosis not present

## 2018-09-14 DIAGNOSIS — G4733 Obstructive sleep apnea (adult) (pediatric): Secondary | ICD-10-CM | POA: Diagnosis not present

## 2018-09-14 DIAGNOSIS — E1122 Type 2 diabetes mellitus with diabetic chronic kidney disease: Secondary | ICD-10-CM | POA: Diagnosis not present

## 2018-09-16 DIAGNOSIS — E1122 Type 2 diabetes mellitus with diabetic chronic kidney disease: Secondary | ICD-10-CM | POA: Diagnosis not present

## 2018-09-16 DIAGNOSIS — G4733 Obstructive sleep apnea (adult) (pediatric): Secondary | ICD-10-CM | POA: Diagnosis not present

## 2018-09-16 DIAGNOSIS — N183 Chronic kidney disease, stage 3 (moderate): Secondary | ICD-10-CM | POA: Diagnosis not present

## 2018-09-16 DIAGNOSIS — F418 Other specified anxiety disorders: Secondary | ICD-10-CM | POA: Diagnosis not present

## 2018-09-16 DIAGNOSIS — I69351 Hemiplegia and hemiparesis following cerebral infarction affecting right dominant side: Secondary | ICD-10-CM | POA: Diagnosis not present

## 2018-09-16 DIAGNOSIS — F319 Bipolar disorder, unspecified: Secondary | ICD-10-CM | POA: Diagnosis not present

## 2018-09-16 DIAGNOSIS — G8929 Other chronic pain: Secondary | ICD-10-CM | POA: Diagnosis not present

## 2018-09-16 DIAGNOSIS — M4856XD Collapsed vertebra, not elsewhere classified, lumbar region, subsequent encounter for fracture with routine healing: Secondary | ICD-10-CM | POA: Diagnosis not present

## 2018-09-16 DIAGNOSIS — I129 Hypertensive chronic kidney disease with stage 1 through stage 4 chronic kidney disease, or unspecified chronic kidney disease: Secondary | ICD-10-CM | POA: Diagnosis not present

## 2018-09-17 DIAGNOSIS — Z23 Encounter for immunization: Secondary | ICD-10-CM | POA: Diagnosis not present

## 2018-09-17 DIAGNOSIS — Z7901 Long term (current) use of anticoagulants: Secondary | ICD-10-CM | POA: Diagnosis not present

## 2018-09-20 DIAGNOSIS — I69351 Hemiplegia and hemiparesis following cerebral infarction affecting right dominant side: Secondary | ICD-10-CM | POA: Diagnosis not present

## 2018-09-20 DIAGNOSIS — N183 Chronic kidney disease, stage 3 (moderate): Secondary | ICD-10-CM | POA: Diagnosis not present

## 2018-09-20 DIAGNOSIS — F319 Bipolar disorder, unspecified: Secondary | ICD-10-CM | POA: Diagnosis not present

## 2018-09-20 DIAGNOSIS — M4856XD Collapsed vertebra, not elsewhere classified, lumbar region, subsequent encounter for fracture with routine healing: Secondary | ICD-10-CM | POA: Diagnosis not present

## 2018-09-20 DIAGNOSIS — G8929 Other chronic pain: Secondary | ICD-10-CM | POA: Diagnosis not present

## 2018-09-20 DIAGNOSIS — E1122 Type 2 diabetes mellitus with diabetic chronic kidney disease: Secondary | ICD-10-CM | POA: Diagnosis not present

## 2018-09-20 DIAGNOSIS — I129 Hypertensive chronic kidney disease with stage 1 through stage 4 chronic kidney disease, or unspecified chronic kidney disease: Secondary | ICD-10-CM | POA: Diagnosis not present

## 2018-09-20 DIAGNOSIS — G4733 Obstructive sleep apnea (adult) (pediatric): Secondary | ICD-10-CM | POA: Diagnosis not present

## 2018-09-20 DIAGNOSIS — F418 Other specified anxiety disorders: Secondary | ICD-10-CM | POA: Diagnosis not present

## 2018-09-24 DIAGNOSIS — G4733 Obstructive sleep apnea (adult) (pediatric): Secondary | ICD-10-CM | POA: Diagnosis not present

## 2018-09-24 DIAGNOSIS — E119 Type 2 diabetes mellitus without complications: Secondary | ICD-10-CM | POA: Diagnosis not present

## 2018-09-24 DIAGNOSIS — I69359 Hemiplegia and hemiparesis following cerebral infarction affecting unspecified side: Secondary | ICD-10-CM | POA: Diagnosis not present

## 2018-09-24 DIAGNOSIS — F418 Other specified anxiety disorders: Secondary | ICD-10-CM | POA: Diagnosis not present

## 2018-09-24 DIAGNOSIS — G9340 Encephalopathy, unspecified: Secondary | ICD-10-CM | POA: Diagnosis not present

## 2018-09-24 DIAGNOSIS — I69351 Hemiplegia and hemiparesis following cerebral infarction affecting right dominant side: Secondary | ICD-10-CM | POA: Diagnosis not present

## 2018-09-24 DIAGNOSIS — I129 Hypertensive chronic kidney disease with stage 1 through stage 4 chronic kidney disease, or unspecified chronic kidney disease: Secondary | ICD-10-CM | POA: Diagnosis not present

## 2018-09-24 DIAGNOSIS — F319 Bipolar disorder, unspecified: Secondary | ICD-10-CM | POA: Diagnosis not present

## 2018-09-24 DIAGNOSIS — M4856XD Collapsed vertebra, not elsewhere classified, lumbar region, subsequent encounter for fracture with routine healing: Secondary | ICD-10-CM | POA: Diagnosis not present

## 2018-09-24 DIAGNOSIS — N183 Chronic kidney disease, stage 3 (moderate): Secondary | ICD-10-CM | POA: Diagnosis not present

## 2018-09-24 DIAGNOSIS — G8929 Other chronic pain: Secondary | ICD-10-CM | POA: Diagnosis not present

## 2018-09-24 DIAGNOSIS — E1122 Type 2 diabetes mellitus with diabetic chronic kidney disease: Secondary | ICD-10-CM | POA: Diagnosis not present

## 2018-09-24 DIAGNOSIS — I6789 Other cerebrovascular disease: Secondary | ICD-10-CM | POA: Diagnosis not present

## 2018-09-27 DIAGNOSIS — F418 Other specified anxiety disorders: Secondary | ICD-10-CM | POA: Diagnosis not present

## 2018-09-27 DIAGNOSIS — F319 Bipolar disorder, unspecified: Secondary | ICD-10-CM | POA: Diagnosis not present

## 2018-09-27 DIAGNOSIS — E1122 Type 2 diabetes mellitus with diabetic chronic kidney disease: Secondary | ICD-10-CM | POA: Diagnosis not present

## 2018-09-27 DIAGNOSIS — G8929 Other chronic pain: Secondary | ICD-10-CM | POA: Diagnosis not present

## 2018-09-27 DIAGNOSIS — M4856XD Collapsed vertebra, not elsewhere classified, lumbar region, subsequent encounter for fracture with routine healing: Secondary | ICD-10-CM | POA: Diagnosis not present

## 2018-09-27 DIAGNOSIS — I129 Hypertensive chronic kidney disease with stage 1 through stage 4 chronic kidney disease, or unspecified chronic kidney disease: Secondary | ICD-10-CM | POA: Diagnosis not present

## 2018-09-27 DIAGNOSIS — I69351 Hemiplegia and hemiparesis following cerebral infarction affecting right dominant side: Secondary | ICD-10-CM | POA: Diagnosis not present

## 2018-09-27 DIAGNOSIS — G4733 Obstructive sleep apnea (adult) (pediatric): Secondary | ICD-10-CM | POA: Diagnosis not present

## 2018-09-27 DIAGNOSIS — N183 Chronic kidney disease, stage 3 (moderate): Secondary | ICD-10-CM | POA: Diagnosis not present

## 2018-10-08 DIAGNOSIS — F319 Bipolar disorder, unspecified: Secondary | ICD-10-CM | POA: Diagnosis not present

## 2018-10-08 DIAGNOSIS — G4733 Obstructive sleep apnea (adult) (pediatric): Secondary | ICD-10-CM | POA: Diagnosis not present

## 2018-10-08 DIAGNOSIS — M4856XD Collapsed vertebra, not elsewhere classified, lumbar region, subsequent encounter for fracture with routine healing: Secondary | ICD-10-CM | POA: Diagnosis not present

## 2018-10-08 DIAGNOSIS — G8929 Other chronic pain: Secondary | ICD-10-CM | POA: Diagnosis not present

## 2018-10-08 DIAGNOSIS — F418 Other specified anxiety disorders: Secondary | ICD-10-CM | POA: Diagnosis not present

## 2018-10-08 DIAGNOSIS — E1122 Type 2 diabetes mellitus with diabetic chronic kidney disease: Secondary | ICD-10-CM | POA: Diagnosis not present

## 2018-10-08 DIAGNOSIS — N183 Chronic kidney disease, stage 3 (moderate): Secondary | ICD-10-CM | POA: Diagnosis not present

## 2018-10-08 DIAGNOSIS — I69351 Hemiplegia and hemiparesis following cerebral infarction affecting right dominant side: Secondary | ICD-10-CM | POA: Diagnosis not present

## 2018-10-08 DIAGNOSIS — I129 Hypertensive chronic kidney disease with stage 1 through stage 4 chronic kidney disease, or unspecified chronic kidney disease: Secondary | ICD-10-CM | POA: Diagnosis not present

## 2018-10-11 DIAGNOSIS — I129 Hypertensive chronic kidney disease with stage 1 through stage 4 chronic kidney disease, or unspecified chronic kidney disease: Secondary | ICD-10-CM | POA: Diagnosis not present

## 2018-10-11 DIAGNOSIS — G4733 Obstructive sleep apnea (adult) (pediatric): Secondary | ICD-10-CM | POA: Diagnosis not present

## 2018-10-11 DIAGNOSIS — M4856XD Collapsed vertebra, not elsewhere classified, lumbar region, subsequent encounter for fracture with routine healing: Secondary | ICD-10-CM | POA: Diagnosis not present

## 2018-10-11 DIAGNOSIS — F319 Bipolar disorder, unspecified: Secondary | ICD-10-CM | POA: Diagnosis not present

## 2018-10-11 DIAGNOSIS — I69351 Hemiplegia and hemiparesis following cerebral infarction affecting right dominant side: Secondary | ICD-10-CM | POA: Diagnosis not present

## 2018-10-11 DIAGNOSIS — G8929 Other chronic pain: Secondary | ICD-10-CM | POA: Diagnosis not present

## 2018-10-11 DIAGNOSIS — E1122 Type 2 diabetes mellitus with diabetic chronic kidney disease: Secondary | ICD-10-CM | POA: Diagnosis not present

## 2018-10-11 DIAGNOSIS — F418 Other specified anxiety disorders: Secondary | ICD-10-CM | POA: Diagnosis not present

## 2018-10-11 DIAGNOSIS — N183 Chronic kidney disease, stage 3 (moderate): Secondary | ICD-10-CM | POA: Diagnosis not present

## 2018-10-18 DIAGNOSIS — N183 Chronic kidney disease, stage 3 (moderate): Secondary | ICD-10-CM | POA: Diagnosis not present

## 2018-10-18 DIAGNOSIS — F319 Bipolar disorder, unspecified: Secondary | ICD-10-CM | POA: Diagnosis not present

## 2018-10-18 DIAGNOSIS — I69351 Hemiplegia and hemiparesis following cerebral infarction affecting right dominant side: Secondary | ICD-10-CM | POA: Diagnosis not present

## 2018-10-18 DIAGNOSIS — M4856XD Collapsed vertebra, not elsewhere classified, lumbar region, subsequent encounter for fracture with routine healing: Secondary | ICD-10-CM | POA: Diagnosis not present

## 2018-10-18 DIAGNOSIS — E1122 Type 2 diabetes mellitus with diabetic chronic kidney disease: Secondary | ICD-10-CM | POA: Diagnosis not present

## 2018-10-18 DIAGNOSIS — G8929 Other chronic pain: Secondary | ICD-10-CM | POA: Diagnosis not present

## 2018-10-18 DIAGNOSIS — G4733 Obstructive sleep apnea (adult) (pediatric): Secondary | ICD-10-CM | POA: Diagnosis not present

## 2018-10-18 DIAGNOSIS — F418 Other specified anxiety disorders: Secondary | ICD-10-CM | POA: Diagnosis not present

## 2018-10-18 DIAGNOSIS — I129 Hypertensive chronic kidney disease with stage 1 through stage 4 chronic kidney disease, or unspecified chronic kidney disease: Secondary | ICD-10-CM | POA: Diagnosis not present

## 2018-10-22 DIAGNOSIS — Z7901 Long term (current) use of anticoagulants: Secondary | ICD-10-CM | POA: Diagnosis not present

## 2018-10-24 DIAGNOSIS — G9340 Encephalopathy, unspecified: Secondary | ICD-10-CM | POA: Diagnosis not present

## 2018-10-24 DIAGNOSIS — I6789 Other cerebrovascular disease: Secondary | ICD-10-CM | POA: Diagnosis not present

## 2018-10-24 DIAGNOSIS — E119 Type 2 diabetes mellitus without complications: Secondary | ICD-10-CM | POA: Diagnosis not present

## 2018-10-24 DIAGNOSIS — I69359 Hemiplegia and hemiparesis following cerebral infarction affecting unspecified side: Secondary | ICD-10-CM | POA: Diagnosis not present

## 2018-10-25 DIAGNOSIS — E1165 Type 2 diabetes mellitus with hyperglycemia: Secondary | ICD-10-CM | POA: Diagnosis not present

## 2018-10-25 DIAGNOSIS — W19XXXA Unspecified fall, initial encounter: Secondary | ICD-10-CM | POA: Diagnosis not present

## 2018-10-25 DIAGNOSIS — G4730 Sleep apnea, unspecified: Secondary | ICD-10-CM | POA: Diagnosis not present

## 2018-10-25 DIAGNOSIS — I10 Essential (primary) hypertension: Secondary | ICD-10-CM | POA: Diagnosis not present

## 2018-10-25 DIAGNOSIS — Z23 Encounter for immunization: Secondary | ICD-10-CM | POA: Diagnosis not present

## 2018-10-29 DIAGNOSIS — F319 Bipolar disorder, unspecified: Secondary | ICD-10-CM | POA: Diagnosis not present

## 2018-10-29 DIAGNOSIS — F418 Other specified anxiety disorders: Secondary | ICD-10-CM | POA: Diagnosis not present

## 2018-10-29 DIAGNOSIS — G8929 Other chronic pain: Secondary | ICD-10-CM | POA: Diagnosis not present

## 2018-10-29 DIAGNOSIS — I129 Hypertensive chronic kidney disease with stage 1 through stage 4 chronic kidney disease, or unspecified chronic kidney disease: Secondary | ICD-10-CM | POA: Diagnosis not present

## 2018-10-29 DIAGNOSIS — E1122 Type 2 diabetes mellitus with diabetic chronic kidney disease: Secondary | ICD-10-CM | POA: Diagnosis not present

## 2018-10-29 DIAGNOSIS — M4856XD Collapsed vertebra, not elsewhere classified, lumbar region, subsequent encounter for fracture with routine healing: Secondary | ICD-10-CM | POA: Diagnosis not present

## 2018-10-29 DIAGNOSIS — N183 Chronic kidney disease, stage 3 (moderate): Secondary | ICD-10-CM | POA: Diagnosis not present

## 2018-10-29 DIAGNOSIS — I69351 Hemiplegia and hemiparesis following cerebral infarction affecting right dominant side: Secondary | ICD-10-CM | POA: Diagnosis not present

## 2018-10-29 DIAGNOSIS — G4733 Obstructive sleep apnea (adult) (pediatric): Secondary | ICD-10-CM | POA: Diagnosis not present

## 2018-11-03 DIAGNOSIS — N183 Chronic kidney disease, stage 3 (moderate): Secondary | ICD-10-CM | POA: Diagnosis not present

## 2018-11-03 DIAGNOSIS — M4856XD Collapsed vertebra, not elsewhere classified, lumbar region, subsequent encounter for fracture with routine healing: Secondary | ICD-10-CM | POA: Diagnosis not present

## 2018-11-03 DIAGNOSIS — G8929 Other chronic pain: Secondary | ICD-10-CM | POA: Diagnosis not present

## 2018-11-03 DIAGNOSIS — I129 Hypertensive chronic kidney disease with stage 1 through stage 4 chronic kidney disease, or unspecified chronic kidney disease: Secondary | ICD-10-CM | POA: Diagnosis not present

## 2018-11-03 DIAGNOSIS — I69351 Hemiplegia and hemiparesis following cerebral infarction affecting right dominant side: Secondary | ICD-10-CM | POA: Diagnosis not present

## 2018-11-03 DIAGNOSIS — F319 Bipolar disorder, unspecified: Secondary | ICD-10-CM | POA: Diagnosis not present

## 2018-11-03 DIAGNOSIS — E1122 Type 2 diabetes mellitus with diabetic chronic kidney disease: Secondary | ICD-10-CM | POA: Diagnosis not present

## 2018-11-03 DIAGNOSIS — G4733 Obstructive sleep apnea (adult) (pediatric): Secondary | ICD-10-CM | POA: Diagnosis not present

## 2018-11-03 DIAGNOSIS — F418 Other specified anxiety disorders: Secondary | ICD-10-CM | POA: Diagnosis not present

## 2018-11-19 DIAGNOSIS — Z7901 Long term (current) use of anticoagulants: Secondary | ICD-10-CM | POA: Diagnosis not present

## 2018-11-24 DIAGNOSIS — I69359 Hemiplegia and hemiparesis following cerebral infarction affecting unspecified side: Secondary | ICD-10-CM | POA: Diagnosis not present

## 2018-11-24 DIAGNOSIS — I6789 Other cerebrovascular disease: Secondary | ICD-10-CM | POA: Diagnosis not present

## 2018-11-24 DIAGNOSIS — G9340 Encephalopathy, unspecified: Secondary | ICD-10-CM | POA: Diagnosis not present

## 2018-11-24 DIAGNOSIS — E119 Type 2 diabetes mellitus without complications: Secondary | ICD-10-CM | POA: Diagnosis not present

## 2018-12-13 DIAGNOSIS — Z1211 Encounter for screening for malignant neoplasm of colon: Secondary | ICD-10-CM | POA: Diagnosis not present

## 2018-12-13 DIAGNOSIS — Z1212 Encounter for screening for malignant neoplasm of rectum: Secondary | ICD-10-CM | POA: Diagnosis not present

## 2018-12-13 DIAGNOSIS — G4733 Obstructive sleep apnea (adult) (pediatric): Secondary | ICD-10-CM | POA: Diagnosis not present

## 2018-12-13 DIAGNOSIS — Z7901 Long term (current) use of anticoagulants: Secondary | ICD-10-CM | POA: Diagnosis not present

## 2018-12-25 DIAGNOSIS — I69359 Hemiplegia and hemiparesis following cerebral infarction affecting unspecified side: Secondary | ICD-10-CM | POA: Diagnosis not present

## 2018-12-25 DIAGNOSIS — E119 Type 2 diabetes mellitus without complications: Secondary | ICD-10-CM | POA: Diagnosis not present

## 2018-12-25 DIAGNOSIS — I6789 Other cerebrovascular disease: Secondary | ICD-10-CM | POA: Diagnosis not present

## 2018-12-25 DIAGNOSIS — G9340 Encephalopathy, unspecified: Secondary | ICD-10-CM | POA: Diagnosis not present

## 2018-12-28 DIAGNOSIS — G4733 Obstructive sleep apnea (adult) (pediatric): Secondary | ICD-10-CM | POA: Diagnosis not present

## 2019-01-14 DIAGNOSIS — Z7901 Long term (current) use of anticoagulants: Secondary | ICD-10-CM | POA: Diagnosis not present

## 2019-01-23 DIAGNOSIS — I6789 Other cerebrovascular disease: Secondary | ICD-10-CM | POA: Diagnosis not present

## 2019-01-23 DIAGNOSIS — G9340 Encephalopathy, unspecified: Secondary | ICD-10-CM | POA: Diagnosis not present

## 2019-01-23 DIAGNOSIS — E119 Type 2 diabetes mellitus without complications: Secondary | ICD-10-CM | POA: Diagnosis not present

## 2019-01-23 DIAGNOSIS — I69359 Hemiplegia and hemiparesis following cerebral infarction affecting unspecified side: Secondary | ICD-10-CM | POA: Diagnosis not present

## 2019-01-25 DIAGNOSIS — Z9181 History of falling: Secondary | ICD-10-CM | POA: Diagnosis not present

## 2019-01-25 DIAGNOSIS — E1122 Type 2 diabetes mellitus with diabetic chronic kidney disease: Secondary | ICD-10-CM | POA: Diagnosis not present

## 2019-01-25 DIAGNOSIS — M4856XD Collapsed vertebra, not elsewhere classified, lumbar region, subsequent encounter for fracture with routine healing: Secondary | ICD-10-CM | POA: Diagnosis not present

## 2019-01-25 DIAGNOSIS — G8929 Other chronic pain: Secondary | ICD-10-CM | POA: Diagnosis not present

## 2019-01-27 ENCOUNTER — Other Ambulatory Visit: Payer: Self-pay

## 2019-01-27 ENCOUNTER — Encounter: Payer: Self-pay | Admitting: Psychiatry

## 2019-01-27 ENCOUNTER — Ambulatory Visit (INDEPENDENT_AMBULATORY_CARE_PROVIDER_SITE_OTHER): Payer: Medicare HMO | Admitting: Psychiatry

## 2019-01-27 DIAGNOSIS — F341 Dysthymic disorder: Secondary | ICD-10-CM | POA: Diagnosis not present

## 2019-01-27 DIAGNOSIS — F330 Major depressive disorder, recurrent, mild: Secondary | ICD-10-CM

## 2019-01-27 DIAGNOSIS — F4001 Agoraphobia with panic disorder: Secondary | ICD-10-CM | POA: Diagnosis not present

## 2019-01-27 DIAGNOSIS — F411 Generalized anxiety disorder: Secondary | ICD-10-CM

## 2019-01-27 NOTE — Progress Notes (Signed)
Dakota Hall [id number] [birth year] 68 y.o.  Subjective:   Patient ID:  Dakota Hall is a 68 y.o. (DOB [birth year]) male.  Chief Complaint:  Chief Complaint  Patient presents with  . Follow-up    Medication Management    HPI Dakota Hall presents to the office today for follow-up of depression and anxiety.    Last seen Septbember.  No med changes then.  So so.  Feels a burden to people.  Sister kicked out of her home by the kids and now staying with them and that's a burden.  More anxious an chest tightness.  Doesn't know why the kids kicked her out.  She also had a break up with BF.  Disrupted house.  Mild chronic depression.  Anxiety ok before she came.  [place] for sale.  Wife looking to retire.  Pt reports that mood is Anxious and Depressed and describes anxiety as Moderate. Anxiety symptoms include: Excessive Worry,. Pt reports no sleep issues. Pt reports that appetite is good. Pt reports that energy is lethargic and no change and poor motivation. Concentration is improved and poor. Suicidal thoughts:  denied by patient.  Takes meds himself in a pill box wife fixes.  Compliant. Review of Systems:  Review of Systems  Constitutional: Positive for fatigue.  Neurological: Positive for speech difficulty, weakness and headaches.  Psychiatric/Behavioral: Positive for decreased concentration and dysphoric mood. Negative for agitation, behavioral problems, confusion, hallucinations, self-injury, sleep disturbance and suicidal ideas. The patient is nervous/anxious. The patient is not hyperactive.     Medications: I have reviewed the patient's current medications.  Current Outpatient Medications  Medication Sig Dispense Refill  . amLODipine (NORVASC) 10 MG tablet Take 1 tablet (10 mg total) by mouth daily. 30 tablet 0  . ARIPiprazole (ABILIFY) 5 MG tablet Take 0.5 tablets (2.5 mg total) by mouth daily. Take 1/2 tablet to = 2.5 mg 15 tablet 0  . baclofen (LIORESAL) 20 MG  tablet Take 1 tablet (20 mg total) by mouth 3 (three) times daily as needed for muscle spasms. 30 each 0  . Blood Glucose Monitoring Suppl (ONE TOUCH ULTRA 2) W/DEVICE KIT     . docusate sodium (COLACE) 100 MG capsule Take 1 capsule (100 mg total) by mouth 3 (three) times a week. M-W-F 12 capsule 0  . furosemide (LASIX) 20 MG tablet Take 1 tablet (20 mg total) by mouth daily. 30 tablet 0  . glucose blood (ONE TOUCH ULTRA TEST) test strip Use each time you check your blood sugar. 100 each 0  . hydrALAZINE (APRESOLINE) 25 MG tablet Take 1 tablet (25 mg total) by mouth 2 (two) times daily. 60 tablet 0  . isosorbide mononitrate (IMDUR) 30 MG 24 hr tablet Take 1 tablet (30 mg total) by mouth daily. 30 tablet 0  . lidocaine (LIDODERM) 5 % Place 1 patch onto the skin daily. Remove & Discard patch within 12 hours or as directed by MD 30 patch 0  . Menthol, Topical Analgesic, (BIOFREEZE) 4 % GEL Apply 1 application topically 2 (two) times daily as needed (Pain). Apply to joints 89 mL 0  . nystatin (MYCOSTATIN/NYSTOP) powder Apply topically 2 (two) times daily. 15 g 0  . olmesartan (BENICAR) 5 MG tablet Take 1 tablet (5 mg total) by mouth daily. 30 tablet 0  . ondansetron (ZOFRAN) 4 MG tablet Take 1 tablet (4 mg total) by mouth every 6 (six) hours as needed for nausea or vomiting. 30 tablet 0  .  ONETOUCH DELICA LANCETS 87F MISC 1 each by Does not apply route daily as needed. 100 each 0  . pioglitazone (ACTOS) 15 MG tablet Take 0.5 tablets (7.5 mg total) by mouth daily. 15 tablet 0  . potassium chloride SA (K-DUR,KLOR-CON) 20 MEQ tablet Take 1 tablet (20 mEq total) by mouth daily. 30 tablet 0  . sertraline (ZOLOFT) 100 MG tablet Take 1.5 tablets (150 mg total) by mouth daily. 45 tablet 0  . simvastatin (ZOCOR) 20 MG tablet Take 1 tablet (20 mg total) by mouth daily at 6 PM. 30 tablet 0  . traMADol (ULTRAM) 50 MG tablet Take 1 tablet (50 mg total) by mouth 2 (two) times daily as needed for moderate pain. 30  tablet 0  . Vitamin D, Ergocalciferol, (DRISDOL) 50000 UNITS CAPS capsule Take 50,000 Units by mouth every Friday.     . warfarin (COUMADIN) 2.5 MG tablet Take 1-2 tablets (2.5-5 mg total) by mouth See admin instructions. Take 1 tablet M-Tue-Thu-Sat-Sun, take 2 tablets Wed and Friday. 14 tablet 0  . acetaminophen (TYLENOL) 325 MG tablet Take 2 tablets (650 mg total) by mouth every 6 (six) hours as needed for mild pain (or Fever >/= 101).     No current facility-administered medications for this visit.     Medication Side Effects: None  Allergies:  Allergies  Allergen Reactions  . Penicillins Hives and Other (See Comments)    Has patient had a PCN reaction causing immediate rash, facial/tongue/throat swelling, SOB or lightheadedness with hypotension: no Has patient had a PCN reaction causing severe rash involving mucus membranes or skin necrosis: no Has patient had a PCN reaction that required hospitalization: no Has patient had a PCN reaction occurring within the last 10 years: no If all of the above answers are "NO", then may proceed with Cephalosporin use.     Past Medical History:  Diagnosis Date  . 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 Back spasm   . Chronic kidney disease (CKD), stage III (moderate) (HCC)   . Depression with anxiety   . Diabetes mellitus   . Dysarthria   . Dyslipidemia   . Hypertension   . Obesity   . OSA on CPAP   . Pulmonary emboli ([place]) 02/2014  . Stasis edema of both lower extremities   . Stroke Dakota Hall) July 2013   left paramedian pontine, incidental right parietal subcortical. both d/t small vessel disease  . 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 Cancer Mother   . Heart disease Father     Social History   Socioeconomic History  . Marital status: Married    Spouse name: Dakota Hall   . Number of children: 2  . Years of  education: College   . Highest education level: Not on file  Occupational History  . Occupation: disabled    Employer: [employer]  . Occupation: PSR    Employer: [employer]  Social Needs  . Financial resource strain: Not on file  . Food insecurity:    Worry: Not on file    Inability: Not on file  . Transportation needs:    Medical: Not on file    Non-medical: Not on file  Tobacco Use  . Smoking status: Never Smoker  . Smokeless tobacco: Never Used  Substance and Sexual Activity  . Alcohol use: No    Alcohol/week: 0.0  standard drinks  . Drug use: No  . Sexual activity: Not on file  Lifestyle  . Physical activity:    Days per week: Not on file    Minutes per session: Not on file  . Stress: Not on file  Relationships  . Social connections:    Talks on phone: Not on file    Gets together: Not on file    Attends religious service: Not on file    Active member of club or organization: Not on file    Attends meetings of clubs or organizations: Not on file    Relationship status: Not on file  . Intimate partner violence:    Fear of current or ex partner: Not on file    Emotionally abused: Not on file    Physically abused: Not on file    Forced sexual activity: Not on file  Other Topics Concern  . Not on file  Social History Narrative   Patient lives at home with wife Dakota Hall.    Patient has 2 children.    Patient is currently working.    Patient has a Degree.     Past Medical History, Surgical history, Social history, and Family history were reviewed and updated as appropriate.   Please see review of systems for further details on the patient's review from today.   Objective:   Physical Exam:  There were no vitals taken for this visit.  Physical Exam Constitutional:      Appearance: He is obese.  Neurological:     Mental Status: He is alert.     Motor: Weakness and atrophy present.     Gait: Gait abnormal.     Comments: Right sided weakness DT  stroke.  WC bound.  Psychiatric:        Attention and Perception: He is attentive. He does not perceive auditory or visual hallucinations.        Mood and Affect: Mood is anxious and depressed.        Speech: Speech is slurred. Speech is not delayed.        Behavior: Behavior is cooperative.        Thought Content: Thought content is not paranoid. Thought content does not include homicidal or suicidal ideation.        Cognition and Memory: Memory is impaired.     Comments: Fair insight and judgment. Mildly forgetful Does not know his meds.     Lab Review:     Component Value Date/Time   NA 138 04/19/2018 0412   NA 142 1951-01-08   K 3.6 04/19/2018 0412   CL 107 04/19/2018 0412   CO2 22 04/19/2018 0412   GLUCOSE 136 (H) 04/19/2018 0412   BUN 30 (H) 04/19/2018 0412   BUN 16 1951/04/09   CREATININE 1.16 04/19/2018 0412   CALCIUM 8.5 (L) 04/19/2018 0412   PROT 7.3 03/25/2018 0549   ALBUMIN 3.5 03/25/2018 0549   AST 18 03/25/2018 0549   ALT 20 03/25/2018 0549   ALKPHOS 85 03/25/2018 0549   BILITOT 0.7 03/25/2018 0549   GFRNONAA >60 04/19/2018 0412   GFRAA >60 04/19/2018 0412       Component Value Date/Time   WBC 12.1 (H) 04/19/2018 0412   RBC 5.08 04/19/2018 0412   HGB 15.2 04/19/2018 0412   HCT 42.9 04/19/2018 0412   PLT 177 04/19/2018 0412   MCV 84.4 04/19/2018 0412   MCH 29.9 04/19/2018 0412   MCHC 35.4 04/19/2018 0412   RDW 12.5 04/19/2018  0412   LYMPHSABS 2.1 03/24/2018 0210   MONOABS 0.6 03/24/2018 0210   EOSABS 0.2 03/24/2018 0210   BASOSABS 0.0 03/24/2018 0210    No results found for: POCLITH, LITHIUM   No results found for: PHENYTOIN, PHENOBARB, VALPROATE, CBMZ   .res Assessment: Plan:    Mild recurrent major depression ([place])  Generalized anxiety disorder  Panic disorder with agoraphobia  Dysthymia   Santez Raybon Conard is back to his baseline of mild chronic depression and anxiety.  He has had multiple relapses in the past due to noncompliance  with medication but now his wife administers the medication.  He also had relapse with reductions in the dosage of Abilify and sertraline.  He is back to baseline with the current dosages.  He is not having side effects.  He is satisfied with the medication.  Discussed potential metabolic side effects associated with atypical antipsychotics, as well as potential risk for movement side effects. Advised pt to contact office if movement side effects occur.  no evidence for movement disorder  Follow-up 6 to 9 months because he is stable again.  It is not worth the risk to try to change the medicines further and he agrees.  None no med changes this visit  Lynder Parents, MD, DFAPA   Please see After Visit Summary for patient specific instructions.  No future appointments.  No orders of the defined types were placed in this encounter.     -------------------------------

## 2019-02-11 DIAGNOSIS — Z7901 Long term (current) use of anticoagulants: Secondary | ICD-10-CM | POA: Diagnosis not present

## 2019-02-11 DIAGNOSIS — Z23 Encounter for immunization: Secondary | ICD-10-CM | POA: Diagnosis not present

## 2019-02-17 ENCOUNTER — Other Ambulatory Visit: Payer: Self-pay | Admitting: Adult Health

## 2019-02-17 DIAGNOSIS — Z8673 Personal history of transient ischemic attack (TIA), and cerebral infarction without residual deficits: Secondary | ICD-10-CM

## 2019-02-17 DIAGNOSIS — I10 Essential (primary) hypertension: Secondary | ICD-10-CM

## 2019-02-25 DIAGNOSIS — M4856XD Collapsed vertebra, not elsewhere classified, lumbar region, subsequent encounter for fracture with routine healing: Secondary | ICD-10-CM | POA: Diagnosis not present

## 2019-02-25 DIAGNOSIS — G8929 Other chronic pain: Secondary | ICD-10-CM | POA: Diagnosis not present

## 2019-02-25 DIAGNOSIS — Z9181 History of falling: Secondary | ICD-10-CM | POA: Diagnosis not present

## 2019-02-25 DIAGNOSIS — E1122 Type 2 diabetes mellitus with diabetic chronic kidney disease: Secondary | ICD-10-CM | POA: Diagnosis not present

## 2019-03-18 DIAGNOSIS — Z7901 Long term (current) use of anticoagulants: Secondary | ICD-10-CM | POA: Diagnosis not present

## 2019-03-24 DIAGNOSIS — I10 Essential (primary) hypertension: Secondary | ICD-10-CM | POA: Diagnosis not present

## 2019-03-24 DIAGNOSIS — E1165 Type 2 diabetes mellitus with hyperglycemia: Secondary | ICD-10-CM | POA: Diagnosis not present

## 2019-03-27 DIAGNOSIS — Z9181 History of falling: Secondary | ICD-10-CM | POA: Diagnosis not present

## 2019-03-27 DIAGNOSIS — G8929 Other chronic pain: Secondary | ICD-10-CM | POA: Diagnosis not present

## 2019-03-27 DIAGNOSIS — E1122 Type 2 diabetes mellitus with diabetic chronic kidney disease: Secondary | ICD-10-CM | POA: Diagnosis not present

## 2019-03-27 DIAGNOSIS — M4856XD Collapsed vertebra, not elsewhere classified, lumbar region, subsequent encounter for fracture with routine healing: Secondary | ICD-10-CM | POA: Diagnosis not present

## 2019-04-01 ENCOUNTER — Other Ambulatory Visit: Payer: Self-pay | Admitting: Adult Health

## 2019-04-01 DIAGNOSIS — Z8673 Personal history of transient ischemic attack (TIA), and cerebral infarction without residual deficits: Secondary | ICD-10-CM

## 2019-04-01 DIAGNOSIS — I10 Essential (primary) hypertension: Secondary | ICD-10-CM

## 2019-04-01 DIAGNOSIS — Z7901 Long term (current) use of anticoagulants: Secondary | ICD-10-CM | POA: Diagnosis not present

## 2019-04-22 DIAGNOSIS — Z7901 Long term (current) use of anticoagulants: Secondary | ICD-10-CM | POA: Diagnosis not present

## 2019-04-27 DIAGNOSIS — E1122 Type 2 diabetes mellitus with diabetic chronic kidney disease: Secondary | ICD-10-CM | POA: Diagnosis not present

## 2019-04-27 DIAGNOSIS — Z9181 History of falling: Secondary | ICD-10-CM | POA: Diagnosis not present

## 2019-04-27 DIAGNOSIS — G8929 Other chronic pain: Secondary | ICD-10-CM | POA: Diagnosis not present

## 2019-04-27 DIAGNOSIS — M4856XD Collapsed vertebra, not elsewhere classified, lumbar region, subsequent encounter for fracture with routine healing: Secondary | ICD-10-CM | POA: Diagnosis not present

## 2019-05-27 DIAGNOSIS — G8929 Other chronic pain: Secondary | ICD-10-CM | POA: Diagnosis not present

## 2019-05-27 DIAGNOSIS — E1122 Type 2 diabetes mellitus with diabetic chronic kidney disease: Secondary | ICD-10-CM | POA: Diagnosis not present

## 2019-05-27 DIAGNOSIS — M4856XD Collapsed vertebra, not elsewhere classified, lumbar region, subsequent encounter for fracture with routine healing: Secondary | ICD-10-CM | POA: Diagnosis not present

## 2019-05-27 DIAGNOSIS — Z0000 Encounter for general adult medical examination without abnormal findings: Secondary | ICD-10-CM | POA: Diagnosis not present

## 2019-05-27 DIAGNOSIS — Z9181 History of falling: Secondary | ICD-10-CM | POA: Diagnosis not present

## 2019-05-27 DIAGNOSIS — Z1389 Encounter for screening for other disorder: Secondary | ICD-10-CM | POA: Diagnosis not present

## 2019-05-27 DIAGNOSIS — G4730 Sleep apnea, unspecified: Secondary | ICD-10-CM | POA: Diagnosis not present

## 2019-05-27 DIAGNOSIS — I10 Essential (primary) hypertension: Secondary | ICD-10-CM | POA: Diagnosis not present

## 2019-05-27 DIAGNOSIS — Z7901 Long term (current) use of anticoagulants: Secondary | ICD-10-CM | POA: Diagnosis not present

## 2019-05-27 DIAGNOSIS — Z23 Encounter for immunization: Secondary | ICD-10-CM | POA: Diagnosis not present

## 2019-05-27 DIAGNOSIS — I69151 Hemiplegia and hemiparesis following nontraumatic intracerebral hemorrhage affecting right dominant side: Secondary | ICD-10-CM | POA: Diagnosis not present

## 2019-05-27 DIAGNOSIS — Z125 Encounter for screening for malignant neoplasm of prostate: Secondary | ICD-10-CM | POA: Diagnosis not present

## 2019-05-27 DIAGNOSIS — E1169 Type 2 diabetes mellitus with other specified complication: Secondary | ICD-10-CM | POA: Diagnosis not present

## 2019-05-27 DIAGNOSIS — N183 Chronic kidney disease, stage 3 (moderate): Secondary | ICD-10-CM | POA: Diagnosis not present

## 2019-05-31 ENCOUNTER — Other Ambulatory Visit: Payer: Self-pay | Admitting: Psychiatry

## 2019-05-31 DIAGNOSIS — F319 Bipolar disorder, unspecified: Secondary | ICD-10-CM

## 2019-06-08 ENCOUNTER — Other Ambulatory Visit: Payer: Self-pay | Admitting: Psychiatry

## 2019-06-08 DIAGNOSIS — F319 Bipolar disorder, unspecified: Secondary | ICD-10-CM

## 2019-06-10 DIAGNOSIS — Z7901 Long term (current) use of anticoagulants: Secondary | ICD-10-CM | POA: Diagnosis not present

## 2019-06-12 ENCOUNTER — Other Ambulatory Visit: Payer: Self-pay | Admitting: Psychiatry

## 2019-06-12 DIAGNOSIS — F319 Bipolar disorder, unspecified: Secondary | ICD-10-CM

## 2019-06-13 ENCOUNTER — Telehealth: Payer: Self-pay | Admitting: Psychiatry

## 2019-06-13 NOTE — Telephone Encounter (Signed)
Patient stated  dosage of Ability was reduced please notify Kristopher Oppenheim on [employer] and refill script for [employer]

## 2019-06-14 ENCOUNTER — Other Ambulatory Visit: Payer: Self-pay | Admitting: Psychiatry

## 2019-06-14 ENCOUNTER — Other Ambulatory Visit: Payer: Self-pay

## 2019-06-14 DIAGNOSIS — F319 Bipolar disorder, unspecified: Secondary | ICD-10-CM

## 2019-06-14 DIAGNOSIS — Z7901 Long term (current) use of anticoagulants: Secondary | ICD-10-CM | POA: Diagnosis not present

## 2019-06-14 MED ORDER — ARIPIPRAZOLE 5 MG PO TABS: 5.0000 mg | ORAL_TABLET | Freq: Every day | ORAL | 2 refills | Status: DC

## 2019-06-14 NOTE — Telephone Encounter (Signed)
Pt. Stated that he takes 5 Mg of Abilify daily. Not 2.5 Mg.

## 2019-06-14 NOTE — Telephone Encounter (Signed)
Thank you for clarification, will update his pharmacy to fill for higher dose.

## 2019-06-27 DIAGNOSIS — Z9181 History of falling: Secondary | ICD-10-CM | POA: Diagnosis not present

## 2019-06-27 DIAGNOSIS — E1122 Type 2 diabetes mellitus with diabetic chronic kidney disease: Secondary | ICD-10-CM | POA: Diagnosis not present

## 2019-06-27 DIAGNOSIS — M4856XD Collapsed vertebra, not elsewhere classified, lumbar region, subsequent encounter for fracture with routine healing: Secondary | ICD-10-CM | POA: Diagnosis not present

## 2019-06-27 DIAGNOSIS — G8929 Other chronic pain: Secondary | ICD-10-CM | POA: Diagnosis not present

## 2019-07-08 DIAGNOSIS — Z7901 Long term (current) use of anticoagulants: Secondary | ICD-10-CM | POA: Diagnosis not present

## 2019-07-15 DIAGNOSIS — G4733 Obstructive sleep apnea (adult) (pediatric): Secondary | ICD-10-CM | POA: Diagnosis not present

## 2019-07-22 DIAGNOSIS — Z7901 Long term (current) use of anticoagulants: Secondary | ICD-10-CM | POA: Diagnosis not present

## 2019-07-26 DIAGNOSIS — Z7901 Long term (current) use of anticoagulants: Secondary | ICD-10-CM | POA: Diagnosis not present

## 2019-07-28 DIAGNOSIS — M4856XD Collapsed vertebra, not elsewhere classified, lumbar region, subsequent encounter for fracture with routine healing: Secondary | ICD-10-CM | POA: Diagnosis not present

## 2019-07-28 DIAGNOSIS — E1122 Type 2 diabetes mellitus with diabetic chronic kidney disease: Secondary | ICD-10-CM | POA: Diagnosis not present

## 2019-07-28 DIAGNOSIS — Z9181 History of falling: Secondary | ICD-10-CM | POA: Diagnosis not present

## 2019-07-28 DIAGNOSIS — G8929 Other chronic pain: Secondary | ICD-10-CM | POA: Diagnosis not present

## 2019-08-12 DIAGNOSIS — Z23 Encounter for immunization: Secondary | ICD-10-CM | POA: Diagnosis not present

## 2019-08-12 DIAGNOSIS — Z7901 Long term (current) use of anticoagulants: Secondary | ICD-10-CM | POA: Diagnosis not present

## 2019-08-26 DIAGNOSIS — Z7901 Long term (current) use of anticoagulants: Secondary | ICD-10-CM | POA: Diagnosis not present

## 2019-08-27 DIAGNOSIS — Z9181 History of falling: Secondary | ICD-10-CM | POA: Diagnosis not present

## 2019-08-27 DIAGNOSIS — G8929 Other chronic pain: Secondary | ICD-10-CM | POA: Diagnosis not present

## 2019-08-27 DIAGNOSIS — M4856XD Collapsed vertebra, not elsewhere classified, lumbar region, subsequent encounter for fracture with routine healing: Secondary | ICD-10-CM | POA: Diagnosis not present

## 2019-08-27 DIAGNOSIS — E1122 Type 2 diabetes mellitus with diabetic chronic kidney disease: Secondary | ICD-10-CM | POA: Diagnosis not present

## 2019-09-02 DIAGNOSIS — Z7901 Long term (current) use of anticoagulants: Secondary | ICD-10-CM | POA: Diagnosis not present

## 2019-09-16 ENCOUNTER — Other Ambulatory Visit: Payer: Self-pay

## 2019-09-16 ENCOUNTER — Encounter (HOSPITAL_BASED_OUTPATIENT_CLINIC_OR_DEPARTMENT_OTHER): Payer: Self-pay | Admitting: *Deleted

## 2019-09-16 ENCOUNTER — Emergency Department (HOSPITAL_BASED_OUTPATIENT_CLINIC_OR_DEPARTMENT_OTHER)
Admission: EM | Admit: 2019-09-16 | Discharge: 2019-09-16 | Disposition: A | Discharge status: Home / Self Care | Payer: Medicare HMO | Attending: Emergency Medicine | Admitting: Emergency Medicine

## 2019-09-16 DIAGNOSIS — E1122 Type 2 diabetes mellitus with diabetic chronic kidney disease: Secondary | ICD-10-CM | POA: Insufficient documentation

## 2019-09-16 DIAGNOSIS — Z8673 Personal history of transient ischemic attack (TIA), and cerebral infarction without residual deficits: Secondary | ICD-10-CM | POA: Diagnosis not present

## 2019-09-16 DIAGNOSIS — N183 Chronic kidney disease, stage 3 unspecified: Secondary | ICD-10-CM | POA: Diagnosis not present

## 2019-09-16 DIAGNOSIS — R791 Abnormal coagulation profile: Secondary | ICD-10-CM | POA: Diagnosis not present

## 2019-09-16 DIAGNOSIS — Z79899 Other long term (current) drug therapy: Secondary | ICD-10-CM | POA: Diagnosis not present

## 2019-09-16 DIAGNOSIS — Z7901 Long term (current) use of anticoagulants: Secondary | ICD-10-CM | POA: Diagnosis not present

## 2019-09-16 DIAGNOSIS — I129 Hypertensive chronic kidney disease with stage 1 through stage 4 chronic kidney disease, or unspecified chronic kidney disease: Secondary | ICD-10-CM | POA: Diagnosis not present

## 2019-09-16 LAB — CBC WITH DIFFERENTIAL/PLATELET
Abs Immature Granulocytes: 0.02 10*3/uL (ref 0.00–0.07)
Basophils Absolute: 0 10*3/uL (ref 0.0–0.1)
Basophils Relative: 0 %
Eosinophils Absolute: 0.1 10*3/uL (ref 0.0–0.5)
Eosinophils Relative: 1 %
HCT: 48.2 % (ref 39.0–52.0)
Hemoglobin: 15.8 g/dL (ref 13.0–17.0)
Immature Granulocytes: 0 %
Lymphocytes Relative: 22 %
Lymphs Abs: 1.4 10*3/uL (ref 0.7–4.0)
MCH: 30.1 pg (ref 26.0–34.0)
MCHC: 32.8 g/dL (ref 30.0–36.0)
MCV: 91.8 fL (ref 80.0–100.0)
Monocytes Absolute: 0.5 10*3/uL (ref 0.1–1.0)
Monocytes Relative: 8 %
Neutro Abs: 4.3 10*3/uL (ref 1.7–7.7)
Neutrophils Relative %: 69 %
Platelets: 122 10*3/uL — ABNORMAL LOW (ref 150–400)
RBC: 5.25 MIL/uL (ref 4.22–5.81)
RDW: 13.3 % (ref 11.5–15.5)
WBC: 6.2 10*3/uL (ref 4.0–10.5)
nRBC: 0 % (ref 0.0–0.2)

## 2019-09-16 LAB — BASIC METABOLIC PANEL
Anion gap: 11 (ref 5–15)
BUN: 23 mg/dL (ref 8–23)
CO2: 24 mmol/L (ref 22–32)
Calcium: 9.5 mg/dL (ref 8.9–10.3)
Chloride: 105 mmol/L (ref 98–111)
Creatinine, Ser: 1.48 mg/dL — ABNORMAL HIGH (ref 0.61–1.24)
GFR calc Af Amer: 56 mL/min — ABNORMAL LOW (ref >60)
GFR calc non Af Amer: 48 mL/min — ABNORMAL LOW (ref >60)
Glucose, Bld: 82 mg/dL (ref 70–99)
Potassium: 3.8 mmol/L (ref 3.5–5.1)
Sodium: 140 mmol/L (ref 135–145)

## 2019-09-16 LAB — PROTIME-INR
INR: 7.9 (ref 0.8–1.2)
Prothrombin Time: 64.9 seconds — ABNORMAL HIGH (ref 11.4–15.2)

## 2019-09-16 LAB — APTT: aPTT: 99 seconds — ABNORMAL HIGH (ref 24–36)

## 2019-09-16 MED ORDER — PHYTONADIONE 5 MG PO TABS
2.5000 mg | ORAL_TABLET | Freq: Once | ORAL | Status: AC
  Administered 2019-09-16: 2.5 mg via ORAL
  Filled 2019-09-16: qty 1

## 2019-09-16 NOTE — ED Triage Notes (Signed)
Abnormal PT/PTT.

## 2019-09-16 NOTE — ED Notes (Signed)
ED Provider at bedside. 

## 2019-09-16 NOTE — ED Provider Notes (Signed)
[place] EMERGENCY DEPARTMENT Provider Note   CSN: [id number] Arrival date & time: 09/16/19  1730     History   Chief Complaint No chief complaint on file.   HPI Dakota Hall is a 68 y.o. male.     The history is provided by the patient and medical records. No language interpreter was used.   Dakota Hall is a 68 y.o. male who presents to the Emergency Department complaining of abnormal lab.  He was referred to the ED for elevated INR to 8.  He takes coumadin for hx/o CVA and has checked every 2-3 weeks.  Denies any recent illnesses, injuries.  Denies falls, fever, cough, n/v/d.  Sxs are mild and constant.   Past Medical History:  Diagnosis Date  . 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 Back spasm   . Chronic kidney disease (CKD), stage III (moderate)   . Depression with anxiety   . Diabetes mellitus   . Dysarthria   . Dyslipidemia   . Hypertension   . Obesity   . OSA on CPAP   . Pulmonary emboli ([place]) 02/2014  . Stasis edema of both lower extremities   . Stroke [facility]) July 2013   left paramedian pontine, incidental right parietal subcortical. both d/t small vessel disease  . Stroke [facility]) March 2015   left basal ganglia secondary to small vessel disease    Patient Active Problem List   Diagnosis Date Noted  . H/O: CVA (cerebrovascular accident)   . Low back pain 04/13/2018  . Type 2 diabetes mellitus with vascular disease ([place]) 04/13/2018  . Closed compression fracture of fourth lumbar vertebra ([place])   . Compression fracture of L4 lumbar vertebra, closed, initial encounter ([place]) 03/24/2018  . Lumbago 03/24/2018  . History of pulmonary embolism 05/03/2014  . AKI (acute kidney injury) ([place]) 04/22/2014  . Bradycardia 04/22/2014  . Encephalopathy 04/22/2014  . Malnutrition of moderate degree ([place]) 03/15/2014  . CVA (cerebral infarction) 02/08/2014  . Depression with anxiety   . 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 Obesity    . Stroke ([place]) 02/03/2014  . Unspecified cerebral artery occlusion with cerebral infarction 07/20/2013  . Stroke, small vessel ([place]) 05/18/2012  . Slurred speech 05/15/2012  . HTN (hypertension) 05/15/2012  . Noncompliance with medication regimen 05/15/2012  . DM (diabetes mellitus) ([place]) 05/15/2012  . Vision changes 05/15/2012    Past Surgical History:  Procedure Laterality Date  . ANAL FISSURE REPAIR    . IR KYPHO EA ADDL LEVEL THORACIC OR LUMBAR  04/16/2018  . IR KYPHO LUMBAR INC FX REDUCE BONE BX UNI/BIL CANNULATION INC/IMAGING  04/16/2018  . IR RADIOLOGIST EVAL & MGMT  05/04/2018  . TONSILLECTOMY          Home Medications    Prior to Admission medications   Medication Sig Start Date End Date Taking? Authorizing Provider  acetaminophen (TYLENOL) 325 MG tablet Take 2 tablets (650 mg total) by mouth every 6 (six) hours as needed for mild pain (or Fever >/= 101). 04/19/18   Mikhail, Velta Addison, DO  amLODipine (NORVASC) 10 MG tablet Take 1 tablet (10 mg total) by mouth daily. 05/13/18   Medina-Vargas, Monina C, NP  ARIPiprazole (ABILIFY) 5 MG tablet Take 1 tablet (5 mg total) by mouth daily. 06/14/19   Cottle, Billey Co., MD  baclofen (LIORESAL) 20 MG tablet Take 1 tablet (20 mg total) by mouth 3 (three) times daily as needed for muscle spasms. 05/13/18   Medina-Vargas,  Monina C, NP  Blood Glucose Monitoring Suppl (ONE TOUCH ULTRA 2) W/DEVICE KIT  05/14/14   [provider]  docusate sodium (COLACE) 100 MG capsule Take 1 capsule (100 mg total) by mouth 3 (three) times a week. M-W-F 05/14/18   Medina-Vargas, Monina C, NP  furosemide (LASIX) 20 MG tablet Take 1 tablet (20 mg total) by mouth daily. 05/13/18   Medina-Vargas, Monina C, NP  glucose blood (ONE TOUCH ULTRA TEST) test strip Use each time you check your blood sugar. 05/13/18   Medina-Vargas, Monina C, NP  hydrALAZINE (APRESOLINE) 25 MG tablet Take 1 tablet (25 mg total) by mouth 2 (two) times daily. 05/13/18   Medina-Vargas,  Monina C, NP  isosorbide mononitrate (IMDUR) 30 MG 24 hr tablet Take 1 tablet (30 mg total) by mouth daily. 05/13/18   Medina-Vargas, Monina C, NP  lidocaine (LIDODERM) 5 % Place 1 patch onto the skin daily. Remove & Discard patch within 12 hours or as directed by MD 05/13/18   Medina-Vargas, Monina C, NP  Menthol, Topical Analgesic, (BIOFREEZE) 4 % GEL Apply 1 application topically 2 (two) times daily as needed (Pain). Apply to joints 05/13/18   Medina-Vargas, Monina C, NP  nystatin (MYCOSTATIN/NYSTOP) powder Apply topically 2 (two) times daily. 05/13/18   Medina-Vargas, Monina C, NP  olmesartan (BENICAR) 5 MG tablet Take 1 tablet (5 mg total) by mouth daily. 05/13/18   Medina-Vargas, Monina C, NP  ondansetron (ZOFRAN) 4 MG tablet Take 1 tablet (4 mg total) by mouth every 6 (six) hours as needed for nausea or vomiting. 05/13/18   Medina-Vargas, Monina C, NP  ONETOUCH DELICA LANCETS 44W MISC 1 each by Does not apply route daily as needed. 05/13/18   Medina-Vargas, Monina C, NP  pioglitazone (ACTOS) 15 MG tablet Take 0.5 tablets (7.5 mg total) by mouth daily. 05/13/18   Medina-Vargas, Monina C, NP  potassium chloride SA (K-DUR,KLOR-CON) 20 MEQ tablet Take 1 tablet (20 mEq total) by mouth daily. 05/13/18   Medina-Vargas, Monina C, NP  sertraline (ZOLOFT) 100 MG tablet TAKE TWO TABLETS BY MOUTH DAILY 06/15/19   Cottle, Billey Co., MD  simvastatin (ZOCOR) 20 MG tablet Take 1 tablet (20 mg total) by mouth daily at 6 PM. 05/13/18   Medina-Vargas, Monina C, NP  traMADol (ULTRAM) 50 MG tablet Take 1 tablet (50 mg total) by mouth 2 (two) times daily as needed for moderate pain. 05/13/18   Medina-Vargas, Monina C, NP  Vitamin D, Ergocalciferol, (DRISDOL) 50000 UNITS CAPS capsule Take 50,000 Units by mouth every Friday.  06/26/13   [provider]  warfarin (COUMADIN) 2.5 MG tablet Take 1-2 tablets (2.5-5 mg total) by mouth See admin instructions. Take 1 tablet M-Tue-Thu-Sat-Sun, take 2 tablets Wed and Friday.  05/13/18   Medina-Vargas, Senaida Lange, NP    Family History Family History  Problem Relation Age of Onset  . Cancer Mother   . Heart disease Father     Social History Social History   Tobacco Use  . Smoking status: Never Smoker  . Smokeless tobacco: Never Used  Substance Use Topics  . Alcohol use: No    Alcohol/week: 0.0 standard drinks  . Drug use: No     Allergies   Penicillins   Review of Systems Review of Systems  All other systems reviewed and are negative.    Physical Exam Updated Vital Signs BP (!) 154/64 (BP Location: Left Arm)   Pulse 74   Temp 98.5 F (36.9 C) (Oral)   Resp  16   Ht '5\' 8"'  (1.727 m)   Wt 106.1 kg   SpO2 97%   BMI 35.58 kg/m   Physical Exam Vitals signs and nursing note reviewed.  Constitutional:      Appearance: He is well-developed.  HENT:     Head: Normocephalic and atraumatic.  Cardiovascular:     Rate and Rhythm: Normal rate and regular rhythm.     Heart sounds: No murmur.  Pulmonary:     Effort: Pulmonary effort is normal. No respiratory distress.     Breath sounds: Normal breath sounds.  Abdominal:     Palpations: Abdomen is soft.     Tenderness: There is no abdominal tenderness. There is no guarding or rebound.  Musculoskeletal:        General: No tenderness.  Skin:    General: Skin is warm and dry.  Neurological:     Mental Status: He is alert and oriented to person, place, and time.     Comments: Slow speech  Psychiatric:        Behavior: Behavior normal.      ED Treatments / Results  Labs (all labs ordered are listed, but only abnormal results are displayed) Labs Reviewed  APTT - Abnormal; Notable for the following components:      Result Value   aPTT 99 (*)    All other components within normal limits  PROTIME-INR - Abnormal; Notable for the following components:   Prothrombin Time 64.9 (*)    INR 7.9 (*)    All other components within normal limits  BASIC METABOLIC PANEL - Abnormal; Notable for the  following components:   Creatinine, Ser 1.48 (*)    GFR calc non Af Amer 48 (*)    GFR calc Af Amer 56 (*)    All other components within normal limits  CBC WITH DIFFERENTIAL/PLATELET - Abnormal; Notable for the following components:   Platelets 122 (*)    All other components within normal limits    EKG None  Radiology No results found.  Procedures Procedures (including critical care time)  Medications Ordered in ED Medications  phytonadione (VITAMIN K) tablet 2.5 mg (2.5 mg Oral Given 09/16/19 1934)     Initial Impression / Assessment and Plan / ED Course  I have reviewed the triage vital signs and the nursing notes.  Pertinent labs & imaging results that were available during my care of the patient were reviewed by me and considered in my medical decision making (see chart for details).        Patient here for evaluation of elevation and INR. He is asymptomatic on evaluation. No reports of bleeding at home, no recent falls. INR is elevated at 7.9 today. Plan to reverse with oral vitamin K. Discussed with patient and daughter home care and return precautions if he were to experience any bleeding. Discussed holding his Coumadin until Monday.  Final Clinical Impressions(s) / ED Diagnoses   Final diagnoses:  Elevated INR    ED Discharge Orders    None       Quintella Reichert, MD 09/17/19 [phone]

## 2019-09-16 NOTE — Discharge Instructions (Addendum)
Do not take your coumadin (warfarin) tonight, tomorrow, or Sunday night.  Call the coumadin clinic for recheck of your level on Monday.  Get rechecked immediately if you hit your head or develop bleeding.

## 2019-09-16 NOTE — ED Notes (Signed)
pts daughter, Melissa Montane,  can be reached at [phone]

## 2019-09-19 DIAGNOSIS — I872 Venous insufficiency (chronic) (peripheral): Secondary | ICD-10-CM | POA: Diagnosis not present

## 2019-09-19 DIAGNOSIS — E1151 Type 2 diabetes mellitus with diabetic peripheral angiopathy without gangrene: Secondary | ICD-10-CM | POA: Diagnosis not present

## 2019-09-19 DIAGNOSIS — E1165 Type 2 diabetes mellitus with hyperglycemia: Secondary | ICD-10-CM | POA: Diagnosis not present

## 2019-09-19 DIAGNOSIS — I2782 Chronic pulmonary embolism: Secondary | ICD-10-CM | POA: Diagnosis not present

## 2019-09-19 DIAGNOSIS — N183 Chronic kidney disease, stage 3 unspecified: Secondary | ICD-10-CM | POA: Diagnosis not present

## 2019-09-19 DIAGNOSIS — I69151 Hemiplegia and hemiparesis following nontraumatic intracerebral hemorrhage affecting right dominant side: Secondary | ICD-10-CM | POA: Diagnosis not present

## 2019-09-19 DIAGNOSIS — I69122 Dysarthria following nontraumatic intracerebral hemorrhage: Secondary | ICD-10-CM | POA: Diagnosis not present

## 2019-09-19 DIAGNOSIS — E1122 Type 2 diabetes mellitus with diabetic chronic kidney disease: Secondary | ICD-10-CM | POA: Diagnosis not present

## 2019-09-19 DIAGNOSIS — I129 Hypertensive chronic kidney disease with stage 1 through stage 4 chronic kidney disease, or unspecified chronic kidney disease: Secondary | ICD-10-CM | POA: Diagnosis not present

## 2019-09-21 DIAGNOSIS — I2782 Chronic pulmonary embolism: Secondary | ICD-10-CM | POA: Diagnosis not present

## 2019-09-21 DIAGNOSIS — I129 Hypertensive chronic kidney disease with stage 1 through stage 4 chronic kidney disease, or unspecified chronic kidney disease: Secondary | ICD-10-CM | POA: Diagnosis not present

## 2019-09-21 DIAGNOSIS — I872 Venous insufficiency (chronic) (peripheral): Secondary | ICD-10-CM | POA: Diagnosis not present

## 2019-09-21 DIAGNOSIS — I69122 Dysarthria following nontraumatic intracerebral hemorrhage: Secondary | ICD-10-CM | POA: Diagnosis not present

## 2019-09-21 DIAGNOSIS — I69151 Hemiplegia and hemiparesis following nontraumatic intracerebral hemorrhage affecting right dominant side: Secondary | ICD-10-CM | POA: Diagnosis not present

## 2019-09-21 DIAGNOSIS — E1165 Type 2 diabetes mellitus with hyperglycemia: Secondary | ICD-10-CM | POA: Diagnosis not present

## 2019-09-21 DIAGNOSIS — E1151 Type 2 diabetes mellitus with diabetic peripheral angiopathy without gangrene: Secondary | ICD-10-CM | POA: Diagnosis not present

## 2019-09-21 DIAGNOSIS — E1122 Type 2 diabetes mellitus with diabetic chronic kidney disease: Secondary | ICD-10-CM | POA: Diagnosis not present

## 2019-09-21 DIAGNOSIS — N183 Chronic kidney disease, stage 3 unspecified: Secondary | ICD-10-CM | POA: Diagnosis not present

## 2019-09-22 DIAGNOSIS — E1122 Type 2 diabetes mellitus with diabetic chronic kidney disease: Secondary | ICD-10-CM | POA: Diagnosis not present

## 2019-09-22 DIAGNOSIS — I69151 Hemiplegia and hemiparesis following nontraumatic intracerebral hemorrhage affecting right dominant side: Secondary | ICD-10-CM | POA: Diagnosis not present

## 2019-09-22 DIAGNOSIS — E1151 Type 2 diabetes mellitus with diabetic peripheral angiopathy without gangrene: Secondary | ICD-10-CM | POA: Diagnosis not present

## 2019-09-22 DIAGNOSIS — E1165 Type 2 diabetes mellitus with hyperglycemia: Secondary | ICD-10-CM | POA: Diagnosis not present

## 2019-09-22 DIAGNOSIS — I69122 Dysarthria following nontraumatic intracerebral hemorrhage: Secondary | ICD-10-CM | POA: Diagnosis not present

## 2019-09-22 DIAGNOSIS — N183 Chronic kidney disease, stage 3 unspecified: Secondary | ICD-10-CM | POA: Diagnosis not present

## 2019-09-22 DIAGNOSIS — I872 Venous insufficiency (chronic) (peripheral): Secondary | ICD-10-CM | POA: Diagnosis not present

## 2019-09-22 DIAGNOSIS — I2782 Chronic pulmonary embolism: Secondary | ICD-10-CM | POA: Diagnosis not present

## 2019-09-22 DIAGNOSIS — I129 Hypertensive chronic kidney disease with stage 1 through stage 4 chronic kidney disease, or unspecified chronic kidney disease: Secondary | ICD-10-CM | POA: Diagnosis not present

## 2019-09-27 DIAGNOSIS — M4856XD Collapsed vertebra, not elsewhere classified, lumbar region, subsequent encounter for fracture with routine healing: Secondary | ICD-10-CM | POA: Diagnosis not present

## 2019-09-27 DIAGNOSIS — E1122 Type 2 diabetes mellitus with diabetic chronic kidney disease: Secondary | ICD-10-CM | POA: Diagnosis not present

## 2019-09-27 DIAGNOSIS — Z9181 History of falling: Secondary | ICD-10-CM | POA: Diagnosis not present

## 2019-09-27 DIAGNOSIS — G8929 Other chronic pain: Secondary | ICD-10-CM | POA: Diagnosis not present

## 2019-09-28 DIAGNOSIS — E1165 Type 2 diabetes mellitus with hyperglycemia: Secondary | ICD-10-CM | POA: Diagnosis not present

## 2019-09-28 DIAGNOSIS — I129 Hypertensive chronic kidney disease with stage 1 through stage 4 chronic kidney disease, or unspecified chronic kidney disease: Secondary | ICD-10-CM | POA: Diagnosis not present

## 2019-09-28 DIAGNOSIS — E1122 Type 2 diabetes mellitus with diabetic chronic kidney disease: Secondary | ICD-10-CM | POA: Diagnosis not present

## 2019-09-28 DIAGNOSIS — I872 Venous insufficiency (chronic) (peripheral): Secondary | ICD-10-CM | POA: Diagnosis not present

## 2019-09-28 DIAGNOSIS — I69151 Hemiplegia and hemiparesis following nontraumatic intracerebral hemorrhage affecting right dominant side: Secondary | ICD-10-CM | POA: Diagnosis not present

## 2019-09-28 DIAGNOSIS — E1151 Type 2 diabetes mellitus with diabetic peripheral angiopathy without gangrene: Secondary | ICD-10-CM | POA: Diagnosis not present

## 2019-09-28 DIAGNOSIS — N183 Chronic kidney disease, stage 3 unspecified: Secondary | ICD-10-CM | POA: Diagnosis not present

## 2019-09-28 DIAGNOSIS — I69122 Dysarthria following nontraumatic intracerebral hemorrhage: Secondary | ICD-10-CM | POA: Diagnosis not present

## 2019-09-28 DIAGNOSIS — I2782 Chronic pulmonary embolism: Secondary | ICD-10-CM | POA: Diagnosis not present

## 2019-09-29 DIAGNOSIS — E1151 Type 2 diabetes mellitus with diabetic peripheral angiopathy without gangrene: Secondary | ICD-10-CM | POA: Diagnosis not present

## 2019-09-29 DIAGNOSIS — I129 Hypertensive chronic kidney disease with stage 1 through stage 4 chronic kidney disease, or unspecified chronic kidney disease: Secondary | ICD-10-CM | POA: Diagnosis not present

## 2019-09-29 DIAGNOSIS — N183 Chronic kidney disease, stage 3 unspecified: Secondary | ICD-10-CM | POA: Diagnosis not present

## 2019-09-29 DIAGNOSIS — I69151 Hemiplegia and hemiparesis following nontraumatic intracerebral hemorrhage affecting right dominant side: Secondary | ICD-10-CM | POA: Diagnosis not present

## 2019-09-29 DIAGNOSIS — E1122 Type 2 diabetes mellitus with diabetic chronic kidney disease: Secondary | ICD-10-CM | POA: Diagnosis not present

## 2019-09-29 DIAGNOSIS — I872 Venous insufficiency (chronic) (peripheral): Secondary | ICD-10-CM | POA: Diagnosis not present

## 2019-09-29 DIAGNOSIS — E1165 Type 2 diabetes mellitus with hyperglycemia: Secondary | ICD-10-CM | POA: Diagnosis not present

## 2019-09-29 DIAGNOSIS — I2782 Chronic pulmonary embolism: Secondary | ICD-10-CM | POA: Diagnosis not present

## 2019-09-29 DIAGNOSIS — I69122 Dysarthria following nontraumatic intracerebral hemorrhage: Secondary | ICD-10-CM | POA: Diagnosis not present

## 2019-10-04 DIAGNOSIS — I69151 Hemiplegia and hemiparesis following nontraumatic intracerebral hemorrhage affecting right dominant side: Secondary | ICD-10-CM | POA: Diagnosis not present

## 2019-10-04 DIAGNOSIS — Z7901 Long term (current) use of anticoagulants: Secondary | ICD-10-CM | POA: Diagnosis not present

## 2019-10-04 DIAGNOSIS — D6859 Other primary thrombophilia: Secondary | ICD-10-CM | POA: Diagnosis not present

## 2019-10-04 DIAGNOSIS — S31000A Unspecified open wound of lower back and pelvis without penetration into retroperitoneum, initial encounter: Secondary | ICD-10-CM | POA: Diagnosis not present

## 2019-10-04 DIAGNOSIS — E1122 Type 2 diabetes mellitus with diabetic chronic kidney disease: Secondary | ICD-10-CM | POA: Diagnosis not present

## 2019-10-05 DIAGNOSIS — N183 Chronic kidney disease, stage 3 unspecified: Secondary | ICD-10-CM | POA: Diagnosis not present

## 2019-10-05 DIAGNOSIS — I69122 Dysarthria following nontraumatic intracerebral hemorrhage: Secondary | ICD-10-CM | POA: Diagnosis not present

## 2019-10-05 DIAGNOSIS — E1165 Type 2 diabetes mellitus with hyperglycemia: Secondary | ICD-10-CM | POA: Diagnosis not present

## 2019-10-05 DIAGNOSIS — I2782 Chronic pulmonary embolism: Secondary | ICD-10-CM | POA: Diagnosis not present

## 2019-10-05 DIAGNOSIS — I69151 Hemiplegia and hemiparesis following nontraumatic intracerebral hemorrhage affecting right dominant side: Secondary | ICD-10-CM | POA: Diagnosis not present

## 2019-10-05 DIAGNOSIS — E1151 Type 2 diabetes mellitus with diabetic peripheral angiopathy without gangrene: Secondary | ICD-10-CM | POA: Diagnosis not present

## 2019-10-05 DIAGNOSIS — I129 Hypertensive chronic kidney disease with stage 1 through stage 4 chronic kidney disease, or unspecified chronic kidney disease: Secondary | ICD-10-CM | POA: Diagnosis not present

## 2019-10-05 DIAGNOSIS — I872 Venous insufficiency (chronic) (peripheral): Secondary | ICD-10-CM | POA: Diagnosis not present

## 2019-10-05 DIAGNOSIS — E1122 Type 2 diabetes mellitus with diabetic chronic kidney disease: Secondary | ICD-10-CM | POA: Diagnosis not present

## 2019-10-12 DIAGNOSIS — I69151 Hemiplegia and hemiparesis following nontraumatic intracerebral hemorrhage affecting right dominant side: Secondary | ICD-10-CM | POA: Diagnosis not present

## 2019-10-12 DIAGNOSIS — N183 Chronic kidney disease, stage 3 unspecified: Secondary | ICD-10-CM | POA: Diagnosis not present

## 2019-10-12 DIAGNOSIS — I69122 Dysarthria following nontraumatic intracerebral hemorrhage: Secondary | ICD-10-CM | POA: Diagnosis not present

## 2019-10-12 DIAGNOSIS — I129 Hypertensive chronic kidney disease with stage 1 through stage 4 chronic kidney disease, or unspecified chronic kidney disease: Secondary | ICD-10-CM | POA: Diagnosis not present

## 2019-10-12 DIAGNOSIS — I2782 Chronic pulmonary embolism: Secondary | ICD-10-CM | POA: Diagnosis not present

## 2019-10-12 DIAGNOSIS — E1165 Type 2 diabetes mellitus with hyperglycemia: Secondary | ICD-10-CM | POA: Diagnosis not present

## 2019-10-12 DIAGNOSIS — E1151 Type 2 diabetes mellitus with diabetic peripheral angiopathy without gangrene: Secondary | ICD-10-CM | POA: Diagnosis not present

## 2019-10-12 DIAGNOSIS — E1122 Type 2 diabetes mellitus with diabetic chronic kidney disease: Secondary | ICD-10-CM | POA: Diagnosis not present

## 2019-10-12 DIAGNOSIS — I872 Venous insufficiency (chronic) (peripheral): Secondary | ICD-10-CM | POA: Diagnosis not present

## 2019-10-17 DIAGNOSIS — I69122 Dysarthria following nontraumatic intracerebral hemorrhage: Secondary | ICD-10-CM | POA: Diagnosis not present

## 2019-10-17 DIAGNOSIS — N183 Chronic kidney disease, stage 3 unspecified: Secondary | ICD-10-CM | POA: Diagnosis not present

## 2019-10-17 DIAGNOSIS — E1165 Type 2 diabetes mellitus with hyperglycemia: Secondary | ICD-10-CM | POA: Diagnosis not present

## 2019-10-17 DIAGNOSIS — I129 Hypertensive chronic kidney disease with stage 1 through stage 4 chronic kidney disease, or unspecified chronic kidney disease: Secondary | ICD-10-CM | POA: Diagnosis not present

## 2019-10-17 DIAGNOSIS — E1122 Type 2 diabetes mellitus with diabetic chronic kidney disease: Secondary | ICD-10-CM | POA: Diagnosis not present

## 2019-10-17 DIAGNOSIS — I2782 Chronic pulmonary embolism: Secondary | ICD-10-CM | POA: Diagnosis not present

## 2019-10-17 DIAGNOSIS — I872 Venous insufficiency (chronic) (peripheral): Secondary | ICD-10-CM | POA: Diagnosis not present

## 2019-10-17 DIAGNOSIS — E1151 Type 2 diabetes mellitus with diabetic peripheral angiopathy without gangrene: Secondary | ICD-10-CM | POA: Diagnosis not present

## 2019-10-17 DIAGNOSIS — I69151 Hemiplegia and hemiparesis following nontraumatic intracerebral hemorrhage affecting right dominant side: Secondary | ICD-10-CM | POA: Diagnosis not present

## 2019-10-20 ENCOUNTER — Ambulatory Visit: Payer: Medicare HMO | Admitting: Psychiatry

## 2019-10-27 DIAGNOSIS — M4856XD Collapsed vertebra, not elsewhere classified, lumbar region, subsequent encounter for fracture with routine healing: Secondary | ICD-10-CM | POA: Diagnosis not present

## 2019-10-27 DIAGNOSIS — G8929 Other chronic pain: Secondary | ICD-10-CM | POA: Diagnosis not present

## 2019-10-27 DIAGNOSIS — G4733 Obstructive sleep apnea (adult) (pediatric): Secondary | ICD-10-CM | POA: Diagnosis not present

## 2019-10-27 DIAGNOSIS — Z9181 History of falling: Secondary | ICD-10-CM | POA: Diagnosis not present

## 2019-10-27 DIAGNOSIS — E1122 Type 2 diabetes mellitus with diabetic chronic kidney disease: Secondary | ICD-10-CM | POA: Diagnosis not present

## 2019-10-28 DIAGNOSIS — E1165 Type 2 diabetes mellitus with hyperglycemia: Secondary | ICD-10-CM | POA: Diagnosis not present

## 2019-10-28 DIAGNOSIS — I2782 Chronic pulmonary embolism: Secondary | ICD-10-CM | POA: Diagnosis not present

## 2019-10-28 DIAGNOSIS — E1151 Type 2 diabetes mellitus with diabetic peripheral angiopathy without gangrene: Secondary | ICD-10-CM | POA: Diagnosis not present

## 2019-10-28 DIAGNOSIS — I69151 Hemiplegia and hemiparesis following nontraumatic intracerebral hemorrhage affecting right dominant side: Secondary | ICD-10-CM | POA: Diagnosis not present

## 2019-10-28 DIAGNOSIS — I129 Hypertensive chronic kidney disease with stage 1 through stage 4 chronic kidney disease, or unspecified chronic kidney disease: Secondary | ICD-10-CM | POA: Diagnosis not present

## 2019-10-28 DIAGNOSIS — N183 Chronic kidney disease, stage 3 unspecified: Secondary | ICD-10-CM | POA: Diagnosis not present

## 2019-10-28 DIAGNOSIS — E1122 Type 2 diabetes mellitus with diabetic chronic kidney disease: Secondary | ICD-10-CM | POA: Diagnosis not present

## 2019-10-28 DIAGNOSIS — I872 Venous insufficiency (chronic) (peripheral): Secondary | ICD-10-CM | POA: Diagnosis not present

## 2019-10-28 DIAGNOSIS — I69122 Dysarthria following nontraumatic intracerebral hemorrhage: Secondary | ICD-10-CM | POA: Diagnosis not present

## 2019-10-31 DIAGNOSIS — E1122 Type 2 diabetes mellitus with diabetic chronic kidney disease: Secondary | ICD-10-CM | POA: Diagnosis not present

## 2019-10-31 DIAGNOSIS — I69122 Dysarthria following nontraumatic intracerebral hemorrhage: Secondary | ICD-10-CM | POA: Diagnosis not present

## 2019-10-31 DIAGNOSIS — E1165 Type 2 diabetes mellitus with hyperglycemia: Secondary | ICD-10-CM | POA: Diagnosis not present

## 2019-10-31 DIAGNOSIS — I129 Hypertensive chronic kidney disease with stage 1 through stage 4 chronic kidney disease, or unspecified chronic kidney disease: Secondary | ICD-10-CM | POA: Diagnosis not present

## 2019-10-31 DIAGNOSIS — I69151 Hemiplegia and hemiparesis following nontraumatic intracerebral hemorrhage affecting right dominant side: Secondary | ICD-10-CM | POA: Diagnosis not present

## 2019-10-31 DIAGNOSIS — N183 Chronic kidney disease, stage 3 unspecified: Secondary | ICD-10-CM | POA: Diagnosis not present

## 2019-10-31 DIAGNOSIS — I872 Venous insufficiency (chronic) (peripheral): Secondary | ICD-10-CM | POA: Diagnosis not present

## 2019-10-31 DIAGNOSIS — I2782 Chronic pulmonary embolism: Secondary | ICD-10-CM | POA: Diagnosis not present

## 2019-10-31 DIAGNOSIS — E1151 Type 2 diabetes mellitus with diabetic peripheral angiopathy without gangrene: Secondary | ICD-10-CM | POA: Diagnosis not present

## 2019-11-02 DIAGNOSIS — I872 Venous insufficiency (chronic) (peripheral): Secondary | ICD-10-CM | POA: Diagnosis not present

## 2019-11-02 DIAGNOSIS — I2782 Chronic pulmonary embolism: Secondary | ICD-10-CM | POA: Diagnosis not present

## 2019-11-02 DIAGNOSIS — E1165 Type 2 diabetes mellitus with hyperglycemia: Secondary | ICD-10-CM | POA: Diagnosis not present

## 2019-11-02 DIAGNOSIS — E1122 Type 2 diabetes mellitus with diabetic chronic kidney disease: Secondary | ICD-10-CM | POA: Diagnosis not present

## 2019-11-02 DIAGNOSIS — I69122 Dysarthria following nontraumatic intracerebral hemorrhage: Secondary | ICD-10-CM | POA: Diagnosis not present

## 2019-11-02 DIAGNOSIS — I129 Hypertensive chronic kidney disease with stage 1 through stage 4 chronic kidney disease, or unspecified chronic kidney disease: Secondary | ICD-10-CM | POA: Diagnosis not present

## 2019-11-02 DIAGNOSIS — E1151 Type 2 diabetes mellitus with diabetic peripheral angiopathy without gangrene: Secondary | ICD-10-CM | POA: Diagnosis not present

## 2019-11-02 DIAGNOSIS — N183 Chronic kidney disease, stage 3 unspecified: Secondary | ICD-10-CM | POA: Diagnosis not present

## 2019-11-02 DIAGNOSIS — I69151 Hemiplegia and hemiparesis following nontraumatic intracerebral hemorrhage affecting right dominant side: Secondary | ICD-10-CM | POA: Diagnosis not present

## 2019-11-07 DIAGNOSIS — I2782 Chronic pulmonary embolism: Secondary | ICD-10-CM | POA: Diagnosis not present

## 2019-11-07 DIAGNOSIS — E1151 Type 2 diabetes mellitus with diabetic peripheral angiopathy without gangrene: Secondary | ICD-10-CM | POA: Diagnosis not present

## 2019-11-07 DIAGNOSIS — I69151 Hemiplegia and hemiparesis following nontraumatic intracerebral hemorrhage affecting right dominant side: Secondary | ICD-10-CM | POA: Diagnosis not present

## 2019-11-07 DIAGNOSIS — I872 Venous insufficiency (chronic) (peripheral): Secondary | ICD-10-CM | POA: Diagnosis not present

## 2019-11-07 DIAGNOSIS — I129 Hypertensive chronic kidney disease with stage 1 through stage 4 chronic kidney disease, or unspecified chronic kidney disease: Secondary | ICD-10-CM | POA: Diagnosis not present

## 2019-11-07 DIAGNOSIS — I69122 Dysarthria following nontraumatic intracerebral hemorrhage: Secondary | ICD-10-CM | POA: Diagnosis not present

## 2019-11-07 DIAGNOSIS — E1165 Type 2 diabetes mellitus with hyperglycemia: Secondary | ICD-10-CM | POA: Diagnosis not present

## 2019-11-07 DIAGNOSIS — N183 Chronic kidney disease, stage 3 unspecified: Secondary | ICD-10-CM | POA: Diagnosis not present

## 2019-11-07 DIAGNOSIS — E1122 Type 2 diabetes mellitus with diabetic chronic kidney disease: Secondary | ICD-10-CM | POA: Diagnosis not present

## 2019-11-14 DIAGNOSIS — I129 Hypertensive chronic kidney disease with stage 1 through stage 4 chronic kidney disease, or unspecified chronic kidney disease: Secondary | ICD-10-CM | POA: Diagnosis not present

## 2019-11-14 DIAGNOSIS — E1122 Type 2 diabetes mellitus with diabetic chronic kidney disease: Secondary | ICD-10-CM | POA: Diagnosis not present

## 2019-11-14 DIAGNOSIS — I69151 Hemiplegia and hemiparesis following nontraumatic intracerebral hemorrhage affecting right dominant side: Secondary | ICD-10-CM | POA: Diagnosis not present

## 2019-11-14 DIAGNOSIS — I872 Venous insufficiency (chronic) (peripheral): Secondary | ICD-10-CM | POA: Diagnosis not present

## 2019-11-14 DIAGNOSIS — E1165 Type 2 diabetes mellitus with hyperglycemia: Secondary | ICD-10-CM | POA: Diagnosis not present

## 2019-11-14 DIAGNOSIS — I69122 Dysarthria following nontraumatic intracerebral hemorrhage: Secondary | ICD-10-CM | POA: Diagnosis not present

## 2019-11-14 DIAGNOSIS — N183 Chronic kidney disease, stage 3 unspecified: Secondary | ICD-10-CM | POA: Diagnosis not present

## 2019-11-14 DIAGNOSIS — E1151 Type 2 diabetes mellitus with diabetic peripheral angiopathy without gangrene: Secondary | ICD-10-CM | POA: Diagnosis not present

## 2019-11-14 DIAGNOSIS — I2782 Chronic pulmonary embolism: Secondary | ICD-10-CM | POA: Diagnosis not present

## 2019-11-23 ENCOUNTER — Ambulatory Visit (INDEPENDENT_AMBULATORY_CARE_PROVIDER_SITE_OTHER): Payer: Medicare HMO | Admitting: Psychiatry

## 2019-11-23 ENCOUNTER — Encounter: Payer: Self-pay | Admitting: Psychiatry

## 2019-11-23 DIAGNOSIS — F331 Major depressive disorder, recurrent, moderate: Secondary | ICD-10-CM | POA: Diagnosis not present

## 2019-11-23 DIAGNOSIS — F341 Dysthymic disorder: Secondary | ICD-10-CM

## 2019-11-23 DIAGNOSIS — F411 Generalized anxiety disorder: Secondary | ICD-10-CM

## 2019-11-23 DIAGNOSIS — F4321 Adjustment disorder with depressed mood: Secondary | ICD-10-CM

## 2019-11-23 DIAGNOSIS — F4001 Agoraphobia with panic disorder: Secondary | ICD-10-CM

## 2019-11-23 NOTE — Progress Notes (Signed)
Dakota Hall [id number] [birth year] 69 y.o.  Virtual Visit via Jackquline [place]  I connected with pt by WebEx and verified that I am speaking with the correct person using two identifiers.   I discussed the limitations, risks, security and privacy concerns of performing an evaluation and management service by Jackquline [place] and the availability of in person appointments. I also discussed with the patient that there may be a patient responsible charge related to this service. The patient expressed understanding and agreed to proceed.  I discussed the assessment and treatment plan with the patient. The patient was provided an opportunity to ask questions and all were answered. The patient agreed with the plan and demonstrated an understanding of the instructions.   The patient was advised to call back or seek an in-person evaluation if the symptoms worsen or if the condition fails to improve as anticipated.  I provided 30 minutes of video time during this encounter. The call started at 345 and ended at 4:15. The patient was located at home and the provider was located office.   Subjective:   Patient ID:  Dakota Hall is a 69 y.o. (DOB 07-Aug-1951) male.  Chief Complaint:  Chief Complaint  Patient presents with  . Follow-up    Medication Managament  . Depression    Medication Managament    Depression        Associated symptoms include decreased concentration, fatigue and headaches.  Associated symptoms include no suicidal ideas.  Jancarlo Biermann presents today for follow-up of depression and anxiety.    Last seen March 2020.  No med changes then.  Not too good.  Wife died Nov 14, 2023 from heart attack. Sudden and unexpected by was on dialysis.  Still living at the same home.  Someone helps with the meds.  Holidays rough.  Kids help.  Lives alone.  Off an on OK withit.  Lonesome.    So so.  Feels a burden to people.  .  Mild chronic depression.  Anxiety ok before she came.  [place] for  sale.  Wife looking to retire.  Pt reports that mood is Anxious and Depressed and describes anxiety as Moderate. Anxiety symptoms include: Excessive Worry,. Pt reports no sleep issues in lift chair. Pt reports that appetite is good. Pt reports that energy is lethargic and no change and poor motivation. Concentration is improved and poor. Suicidal thoughts:  denied by patient.  Takes meds himself in a pill box kid's fixes.  Compliant.  Past Psychiatric Medication Trials: Effexor, Zoloft, Abilify, Wellbutrin, riperidone.  Trazodone, buspirone, lithium  Review of Systems:  Review of Systems  Constitutional: Positive for fatigue.  Neurological: Positive for speech difficulty, weakness and headaches.  Psychiatric/Behavioral: Positive for decreased concentration, depression and dysphoric mood. Negative for agitation, behavioral problems, confusion, hallucinations, self-injury, sleep disturbance and suicidal ideas. The patient is nervous/anxious. The patient is not hyperactive.     Medications: I have reviewed the patient's current medications.  Current Outpatient Medications  Medication Sig Dispense Refill  . acetaminophen (TYLENOL) 325 MG tablet Take 2 tablets (650 mg total) by mouth every 6 (six) hours as needed for mild pain (or Fever >/= 101).    Marland Kitchen amLODipine (NORVASC) 10 MG tablet Take 1 tablet (10 mg total) by mouth daily. 30 tablet 0  . ARIPiprazole (ABILIFY) 5 MG tablet Take 1 tablet (5 mg total) by mouth daily. 90 tablet 2  . baclofen (LIORESAL) 20 MG tablet Take 1 tablet (20 mg total)  by mouth 3 (three) times daily as needed for muscle spasms. 30 each 0  . Blood Glucose Monitoring Suppl (ONE TOUCH ULTRA 2) W/DEVICE KIT     . docusate sodium (COLACE) 100 MG capsule Take 1 capsule (100 mg total) by mouth 3 (three) times a week. M-W-F 12 capsule 0  . glucose blood (ONE TOUCH ULTRA TEST) test strip Use each time you check your blood sugar. 100 each 0  . hydrALAZINE (APRESOLINE) 25 MG tablet  Take 1 tablet (25 mg total) by mouth 2 (two) times daily. 60 tablet 0  . isosorbide mononitrate (IMDUR) 30 MG 24 hr tablet Take 1 tablet (30 mg total) by mouth daily. 30 tablet 0  . lidocaine (LIDODERM) 5 % Place 1 patch onto the skin daily. Remove & Discard patch within 12 hours or as directed by MD 30 patch 0  . Menthol, Topical Analgesic, (BIOFREEZE) 4 % GEL Apply 1 application topically 2 (two) times daily as needed (Pain). Apply to joints 89 mL 0  . nystatin (MYCOSTATIN/NYSTOP) powder Apply topically 2 (two) times daily. 15 g 0  . olmesartan (BENICAR) 5 MG tablet Take 1 tablet (5 mg total) by mouth daily. 30 tablet 0  . ondansetron (ZOFRAN) 4 MG tablet Take 1 tablet (4 mg total) by mouth every 6 (six) hours as needed for nausea or vomiting. 30 tablet 0  . ONETOUCH DELICA LANCETS 65H MISC 1 each by Does not apply route daily as needed. 100 each 0  . pioglitazone (ACTOS) 15 MG tablet Take 0.5 tablets (7.5 mg total) by mouth daily. 15 tablet 0  . potassium chloride SA (K-DUR,KLOR-CON) 20 MEQ tablet Take 1 tablet (20 mEq total) by mouth daily. 30 tablet 0  . sertraline (ZOLOFT) 100 MG tablet TAKE TWO TABLETS BY MOUTH DAILY 180 tablet 2  . simvastatin (ZOCOR) 20 MG tablet Take 1 tablet (20 mg total) by mouth daily at 6 PM. 30 tablet 0  . traMADol (ULTRAM) 50 MG tablet Take 1 tablet (50 mg total) by mouth 2 (two) times daily as needed for moderate pain. 30 tablet 0  . Vitamin D, Ergocalciferol, (DRISDOL) 50000 UNITS CAPS capsule Take 50,000 Units by mouth every Friday.     Marland Kitchen ELIQUIS 2.5 MG TABS tablet      No current facility-administered medications for this visit.    Medication Side Effects: None  Allergies:  Allergies  Allergen Reactions  . Penicillins Hives and Other (See Comments)    Has patient had a PCN reaction causing immediate rash, facial/tongue/throat swelling, SOB or lightheadedness with hypotension: no Has patient had a PCN reaction causing severe rash involving mucus membranes  or skin necrosis: no Has patient had a PCN reaction that required hospitalization: no Has patient had a PCN reaction occurring within the last 10 years: no If all of the above answers are "NO", then may proceed with Cephalosporin use.     Past Medical History:  Diagnosis Date  . 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 Back spasm   . Chronic kidney disease (CKD), stage III (moderate)   . Depression with anxiety   . Diabetes mellitus   . Dysarthria   . Dyslipidemia   . Hypertension   . Obesity   . OSA on CPAP   . Pulmonary emboli ([place]) 02/2014  . Stasis edema of both lower extremities   . Stroke [facility]) July 2013   left paramedian pontine, incidental right parietal subcortical. both d/t small vessel disease  .  Stroke [facility]) March 2015   left basal ganglia secondary to small vessel disease    Family History  Problem Relation Age of Onset  . Cancer Mother   . Heart disease Father     Social History   Socioeconomic History  . Marital status: Married    Spouse name: Mickel Baas   . Number of children: 2  . Years of education: College   . Highest education level: Not on file  Occupational History  . Occupation: disabled    Employer: [employer]  . Occupation: PSR    Employer: [employer]  Tobacco Use  . Smoking status: Never Smoker  . Smokeless tobacco: Never Used  Substance and Sexual Activity  . Alcohol use: No    Alcohol/week: 0.0 standard drinks  . Drug use: No  . Sexual activity: Not on file  Other Topics Concern  . Not on file  Social History Narrative   Patient lives at home with wife Mickel Baas.    Patient has 2 children.    Patient is currently working.    Patient has a Degree.    Social Determinants of Health   Financial Resource Strain:   . Difficulty of Paying Living Expenses: Not on file  Food Insecurity:   . Worried About Charity fundraiser in the Last Year: Not on file  . Ran Out of Food in the Last Year: Not on  file  Transportation Needs:   . Lack of Transportation (Medical): Not on file  . Lack of Transportation (Non-Medical): Not on file  Physical Activity:   . Days of Exercise per Week: Not on file  . Minutes of Exercise per Session: Not on file  Stress:   . Feeling of Stress : Not on file  Social Connections:   . Frequency of Communication with Friends and Family: Not on file  . Frequency of Social Gatherings with Friends and Family: Not on file  . Attends Religious Services: Not on file  . Active Member of Clubs or Organizations: Not on file  . Attends Archivist Meetings: Not on file  . Marital Status: Not on file  Intimate Partner Violence:   . Fear of Current or Ex-Partner: Not on file  . Emotionally Abused: Not on file  . Physically Abused: Not on file  . Sexually Abused: Not on file    Past Medical History, Surgical history, Social history, and Family history were reviewed and updated as appropriate.   Please see review of systems for further details on the patient's review from today.   Objective:   Physical Exam:  There were no vitals taken for this visit.  Physical Exam Neurological:     Mental Status: He is alert and oriented to person, place, and time.     Cranial Nerves: Dysarthria present.  Psychiatric:        Attention and Perception: Attention and perception normal.        Mood and Affect: Mood is anxious and depressed. Affect is tearful.        Speech: Speech normal.        Behavior: Behavior is cooperative.        Thought Content: Thought content normal. Thought content is not paranoid or delusional. Thought content does not include homicidal or suicidal ideation. Thought content does not include homicidal or suicidal plan.        Cognition and Memory: Cognition and memory normal.     Comments: Insight fair and judment fair.  Lab Review:     Component Value Date/Time   NA 140 09/16/2019 1841   NA 142 Jun 02, 1951 0000   K 3.8 09/16/2019  1841   CL 105 09/16/2019 1841   CO2 24 09/16/2019 1841   GLUCOSE 82 09/16/2019 1841   BUN 23 09/16/2019 1841   BUN 16 Oct 20, 1951 0000   CREATININE 1.48 (H) 09/16/2019 1841   CALCIUM 9.5 09/16/2019 1841   PROT 7.3 03/25/2018 0549   ALBUMIN 3.5 03/25/2018 0549   AST 18 03/25/2018 0549   ALT 20 03/25/2018 0549   ALKPHOS 85 03/25/2018 0549   BILITOT 0.7 03/25/2018 0549   GFRNONAA 48 (L) 09/16/2019 1841   GFRAA 56 (L) 09/16/2019 1841       Component Value Date/Time   WBC 6.2 09/16/2019 1841   RBC 5.25 09/16/2019 1841   HGB 15.8 09/16/2019 1841   HCT 48.2 09/16/2019 1841   PLT 122 (L) 09/16/2019 1841   MCV 91.8 09/16/2019 1841   MCH 30.1 09/16/2019 1841   MCHC 32.8 09/16/2019 1841   RDW 13.3 09/16/2019 1841   LYMPHSABS 1.4 09/16/2019 1841   MONOABS 0.5 09/16/2019 1841   EOSABS 0.1 09/16/2019 1841   BASOSABS 0.0 09/16/2019 1841    No results found for: POCLITH, LITHIUM   No results found for: PHENYTOIN, PHENOBARB, VALPROATE, CBMZ   .res Assessment: Plan:    Major depressive disorder, recurrent episode, moderate (HCC)  Generalized anxiety disorder  Panic disorder with agoraphobia  Dysthymia  Grief   Greater than 50% of 30 min non face to face time with patient was spent on counseling and coordination of care. We discussed Raysean Graumann is back to his baseline of mild chronic depression and anxiety.  He has had multiple relapses in the past due to noncompliance with medication but now his kid's administers the medication.  He also had relapse with reductions in the dosage of Abilify and sertraline.  He is back to baseline with the current dosages.  He is not having side effects.  He is satisfied with the medication.    Disc grief over loss of wife in detail.  She was taking care of him.  Disc potential placement if necessary.  Discussed potential metabolic side effects associated with atypical antipsychotics, as well as potential risk for movement side effects.  Advised pt to contact office if movement side effects occur.  no evidence for movement disorder  Follow-up 6 to 9 months because he is stable again.  It is not worth the risk to try to change the medicines further and he agrees.  None no med changes this visit  Lynder Parents, MD, DFAPA   Please see After Visit Summary for patient specific instructions.  No future appointments.  No orders of the defined types were placed in this encounter.     -------------------------------

## 2019-11-27 DIAGNOSIS — M4856XD Collapsed vertebra, not elsewhere classified, lumbar region, subsequent encounter for fracture with routine healing: Secondary | ICD-10-CM | POA: Diagnosis not present

## 2019-11-27 DIAGNOSIS — Z9181 History of falling: Secondary | ICD-10-CM | POA: Diagnosis not present

## 2019-11-27 DIAGNOSIS — E1122 Type 2 diabetes mellitus with diabetic chronic kidney disease: Secondary | ICD-10-CM | POA: Diagnosis not present

## 2019-11-27 DIAGNOSIS — G8929 Other chronic pain: Secondary | ICD-10-CM | POA: Diagnosis not present

## 2019-12-06 DIAGNOSIS — G4733 Obstructive sleep apnea (adult) (pediatric): Secondary | ICD-10-CM | POA: Diagnosis not present

## 2019-12-06 DIAGNOSIS — F3341 Major depressive disorder, recurrent, in partial remission: Secondary | ICD-10-CM | POA: Diagnosis not present

## 2019-12-06 DIAGNOSIS — N183 Chronic kidney disease, stage 3 unspecified: Secondary | ICD-10-CM | POA: Diagnosis not present

## 2019-12-06 DIAGNOSIS — E1122 Type 2 diabetes mellitus with diabetic chronic kidney disease: Secondary | ICD-10-CM | POA: Diagnosis not present

## 2019-12-06 DIAGNOSIS — L89151 Pressure ulcer of sacral region, stage 1: Secondary | ICD-10-CM | POA: Diagnosis not present

## 2019-12-06 DIAGNOSIS — I69151 Hemiplegia and hemiparesis following nontraumatic intracerebral hemorrhage affecting right dominant side: Secondary | ICD-10-CM | POA: Diagnosis not present

## 2019-12-06 DIAGNOSIS — I10 Essential (primary) hypertension: Secondary | ICD-10-CM | POA: Diagnosis not present

## 2019-12-06 DIAGNOSIS — W19XXXA Unspecified fall, initial encounter: Secondary | ICD-10-CM | POA: Diagnosis not present

## 2019-12-06 DIAGNOSIS — R269 Unspecified abnormalities of gait and mobility: Secondary | ICD-10-CM | POA: Diagnosis not present

## 2019-12-07 DIAGNOSIS — G4733 Obstructive sleep apnea (adult) (pediatric): Secondary | ICD-10-CM | POA: Diagnosis not present

## 2019-12-07 DIAGNOSIS — L89321 Pressure ulcer of left buttock, stage 1: Secondary | ICD-10-CM | POA: Diagnosis not present

## 2019-12-07 DIAGNOSIS — I69122 Dysarthria following nontraumatic intracerebral hemorrhage: Secondary | ICD-10-CM | POA: Diagnosis not present

## 2019-12-07 DIAGNOSIS — E1122 Type 2 diabetes mellitus with diabetic chronic kidney disease: Secondary | ICD-10-CM | POA: Diagnosis not present

## 2019-12-07 DIAGNOSIS — I10 Essential (primary) hypertension: Secondary | ICD-10-CM | POA: Diagnosis not present

## 2019-12-07 DIAGNOSIS — L89311 Pressure ulcer of right buttock, stage 1: Secondary | ICD-10-CM | POA: Diagnosis not present

## 2019-12-07 DIAGNOSIS — N183 Chronic kidney disease, stage 3 unspecified: Secondary | ICD-10-CM | POA: Diagnosis not present

## 2019-12-07 DIAGNOSIS — L89151 Pressure ulcer of sacral region, stage 1: Secondary | ICD-10-CM | POA: Diagnosis not present

## 2019-12-07 DIAGNOSIS — I69151 Hemiplegia and hemiparesis following nontraumatic intracerebral hemorrhage affecting right dominant side: Secondary | ICD-10-CM | POA: Diagnosis not present

## 2019-12-07 DIAGNOSIS — F329 Major depressive disorder, single episode, unspecified: Secondary | ICD-10-CM | POA: Diagnosis not present

## 2019-12-08 DIAGNOSIS — I69151 Hemiplegia and hemiparesis following nontraumatic intracerebral hemorrhage affecting right dominant side: Secondary | ICD-10-CM | POA: Diagnosis not present

## 2019-12-08 DIAGNOSIS — I69122 Dysarthria following nontraumatic intracerebral hemorrhage: Secondary | ICD-10-CM | POA: Diagnosis not present

## 2019-12-08 DIAGNOSIS — N183 Chronic kidney disease, stage 3 unspecified: Secondary | ICD-10-CM | POA: Diagnosis not present

## 2019-12-08 DIAGNOSIS — E1122 Type 2 diabetes mellitus with diabetic chronic kidney disease: Secondary | ICD-10-CM | POA: Diagnosis not present

## 2019-12-08 DIAGNOSIS — L89311 Pressure ulcer of right buttock, stage 1: Secondary | ICD-10-CM | POA: Diagnosis not present

## 2019-12-08 DIAGNOSIS — L89321 Pressure ulcer of left buttock, stage 1: Secondary | ICD-10-CM | POA: Diagnosis not present

## 2019-12-08 DIAGNOSIS — F329 Major depressive disorder, single episode, unspecified: Secondary | ICD-10-CM | POA: Diagnosis not present

## 2019-12-08 DIAGNOSIS — I10 Essential (primary) hypertension: Secondary | ICD-10-CM | POA: Diagnosis not present

## 2019-12-08 DIAGNOSIS — G4733 Obstructive sleep apnea (adult) (pediatric): Secondary | ICD-10-CM | POA: Diagnosis not present

## 2019-12-12 DIAGNOSIS — L89311 Pressure ulcer of right buttock, stage 1: Secondary | ICD-10-CM | POA: Diagnosis not present

## 2019-12-12 DIAGNOSIS — E1122 Type 2 diabetes mellitus with diabetic chronic kidney disease: Secondary | ICD-10-CM | POA: Diagnosis not present

## 2019-12-12 DIAGNOSIS — G4733 Obstructive sleep apnea (adult) (pediatric): Secondary | ICD-10-CM | POA: Diagnosis not present

## 2019-12-12 DIAGNOSIS — I69151 Hemiplegia and hemiparesis following nontraumatic intracerebral hemorrhage affecting right dominant side: Secondary | ICD-10-CM | POA: Diagnosis not present

## 2019-12-12 DIAGNOSIS — I69122 Dysarthria following nontraumatic intracerebral hemorrhage: Secondary | ICD-10-CM | POA: Diagnosis not present

## 2019-12-12 DIAGNOSIS — F329 Major depressive disorder, single episode, unspecified: Secondary | ICD-10-CM | POA: Diagnosis not present

## 2019-12-12 DIAGNOSIS — I10 Essential (primary) hypertension: Secondary | ICD-10-CM | POA: Diagnosis not present

## 2019-12-12 DIAGNOSIS — N183 Chronic kidney disease, stage 3 unspecified: Secondary | ICD-10-CM | POA: Diagnosis not present

## 2019-12-12 DIAGNOSIS — L89321 Pressure ulcer of left buttock, stage 1: Secondary | ICD-10-CM | POA: Diagnosis not present

## 2019-12-13 DIAGNOSIS — L89311 Pressure ulcer of right buttock, stage 1: Secondary | ICD-10-CM | POA: Diagnosis not present

## 2019-12-13 DIAGNOSIS — L89321 Pressure ulcer of left buttock, stage 1: Secondary | ICD-10-CM | POA: Diagnosis not present

## 2019-12-13 DIAGNOSIS — I69122 Dysarthria following nontraumatic intracerebral hemorrhage: Secondary | ICD-10-CM | POA: Diagnosis not present

## 2019-12-13 DIAGNOSIS — F329 Major depressive disorder, single episode, unspecified: Secondary | ICD-10-CM | POA: Diagnosis not present

## 2019-12-13 DIAGNOSIS — I69151 Hemiplegia and hemiparesis following nontraumatic intracerebral hemorrhage affecting right dominant side: Secondary | ICD-10-CM | POA: Diagnosis not present

## 2019-12-13 DIAGNOSIS — G4733 Obstructive sleep apnea (adult) (pediatric): Secondary | ICD-10-CM | POA: Diagnosis not present

## 2019-12-13 DIAGNOSIS — I10 Essential (primary) hypertension: Secondary | ICD-10-CM | POA: Diagnosis not present

## 2019-12-13 DIAGNOSIS — E1122 Type 2 diabetes mellitus with diabetic chronic kidney disease: Secondary | ICD-10-CM | POA: Diagnosis not present

## 2019-12-13 DIAGNOSIS — N183 Chronic kidney disease, stage 3 unspecified: Secondary | ICD-10-CM | POA: Diagnosis not present

## 2019-12-15 DIAGNOSIS — L89321 Pressure ulcer of left buttock, stage 1: Secondary | ICD-10-CM | POA: Diagnosis not present

## 2019-12-15 DIAGNOSIS — I69122 Dysarthria following nontraumatic intracerebral hemorrhage: Secondary | ICD-10-CM | POA: Diagnosis not present

## 2019-12-15 DIAGNOSIS — N183 Chronic kidney disease, stage 3 unspecified: Secondary | ICD-10-CM | POA: Diagnosis not present

## 2019-12-15 DIAGNOSIS — F329 Major depressive disorder, single episode, unspecified: Secondary | ICD-10-CM | POA: Diagnosis not present

## 2019-12-15 DIAGNOSIS — I69151 Hemiplegia and hemiparesis following nontraumatic intracerebral hemorrhage affecting right dominant side: Secondary | ICD-10-CM | POA: Diagnosis not present

## 2019-12-15 DIAGNOSIS — I10 Essential (primary) hypertension: Secondary | ICD-10-CM | POA: Diagnosis not present

## 2019-12-15 DIAGNOSIS — L89311 Pressure ulcer of right buttock, stage 1: Secondary | ICD-10-CM | POA: Diagnosis not present

## 2019-12-15 DIAGNOSIS — G4733 Obstructive sleep apnea (adult) (pediatric): Secondary | ICD-10-CM | POA: Diagnosis not present

## 2019-12-15 DIAGNOSIS — E1122 Type 2 diabetes mellitus with diabetic chronic kidney disease: Secondary | ICD-10-CM | POA: Diagnosis not present

## 2019-12-19 DIAGNOSIS — G4733 Obstructive sleep apnea (adult) (pediatric): Secondary | ICD-10-CM | POA: Diagnosis not present

## 2019-12-19 DIAGNOSIS — I69122 Dysarthria following nontraumatic intracerebral hemorrhage: Secondary | ICD-10-CM | POA: Diagnosis not present

## 2019-12-19 DIAGNOSIS — L89321 Pressure ulcer of left buttock, stage 1: Secondary | ICD-10-CM | POA: Diagnosis not present

## 2019-12-19 DIAGNOSIS — E1122 Type 2 diabetes mellitus with diabetic chronic kidney disease: Secondary | ICD-10-CM | POA: Diagnosis not present

## 2019-12-19 DIAGNOSIS — L89311 Pressure ulcer of right buttock, stage 1: Secondary | ICD-10-CM | POA: Diagnosis not present

## 2019-12-19 DIAGNOSIS — F329 Major depressive disorder, single episode, unspecified: Secondary | ICD-10-CM | POA: Diagnosis not present

## 2019-12-19 DIAGNOSIS — N183 Chronic kidney disease, stage 3 unspecified: Secondary | ICD-10-CM | POA: Diagnosis not present

## 2019-12-19 DIAGNOSIS — I69151 Hemiplegia and hemiparesis following nontraumatic intracerebral hemorrhage affecting right dominant side: Secondary | ICD-10-CM | POA: Diagnosis not present

## 2019-12-19 DIAGNOSIS — I10 Essential (primary) hypertension: Secondary | ICD-10-CM | POA: Diagnosis not present

## 2019-12-22 DIAGNOSIS — I69151 Hemiplegia and hemiparesis following nontraumatic intracerebral hemorrhage affecting right dominant side: Secondary | ICD-10-CM | POA: Diagnosis not present

## 2019-12-22 DIAGNOSIS — N183 Chronic kidney disease, stage 3 unspecified: Secondary | ICD-10-CM | POA: Diagnosis not present

## 2019-12-22 DIAGNOSIS — G4733 Obstructive sleep apnea (adult) (pediatric): Secondary | ICD-10-CM | POA: Diagnosis not present

## 2019-12-22 DIAGNOSIS — F329 Major depressive disorder, single episode, unspecified: Secondary | ICD-10-CM | POA: Diagnosis not present

## 2019-12-22 DIAGNOSIS — I10 Essential (primary) hypertension: Secondary | ICD-10-CM | POA: Diagnosis not present

## 2019-12-22 DIAGNOSIS — I69122 Dysarthria following nontraumatic intracerebral hemorrhage: Secondary | ICD-10-CM | POA: Diagnosis not present

## 2019-12-22 DIAGNOSIS — L89321 Pressure ulcer of left buttock, stage 1: Secondary | ICD-10-CM | POA: Diagnosis not present

## 2019-12-22 DIAGNOSIS — E1122 Type 2 diabetes mellitus with diabetic chronic kidney disease: Secondary | ICD-10-CM | POA: Diagnosis not present

## 2019-12-22 DIAGNOSIS — L89311 Pressure ulcer of right buttock, stage 1: Secondary | ICD-10-CM | POA: Diagnosis not present

## 2019-12-26 DIAGNOSIS — L89311 Pressure ulcer of right buttock, stage 1: Secondary | ICD-10-CM | POA: Diagnosis not present

## 2019-12-26 DIAGNOSIS — F329 Major depressive disorder, single episode, unspecified: Secondary | ICD-10-CM | POA: Diagnosis not present

## 2019-12-26 DIAGNOSIS — I69151 Hemiplegia and hemiparesis following nontraumatic intracerebral hemorrhage affecting right dominant side: Secondary | ICD-10-CM | POA: Diagnosis not present

## 2019-12-26 DIAGNOSIS — N183 Chronic kidney disease, stage 3 unspecified: Secondary | ICD-10-CM | POA: Diagnosis not present

## 2019-12-26 DIAGNOSIS — I69122 Dysarthria following nontraumatic intracerebral hemorrhage: Secondary | ICD-10-CM | POA: Diagnosis not present

## 2019-12-26 DIAGNOSIS — E1122 Type 2 diabetes mellitus with diabetic chronic kidney disease: Secondary | ICD-10-CM | POA: Diagnosis not present

## 2019-12-26 DIAGNOSIS — L89321 Pressure ulcer of left buttock, stage 1: Secondary | ICD-10-CM | POA: Diagnosis not present

## 2019-12-26 DIAGNOSIS — G4733 Obstructive sleep apnea (adult) (pediatric): Secondary | ICD-10-CM | POA: Diagnosis not present

## 2019-12-26 DIAGNOSIS — I10 Essential (primary) hypertension: Secondary | ICD-10-CM | POA: Diagnosis not present

## 2019-12-27 DIAGNOSIS — F329 Major depressive disorder, single episode, unspecified: Secondary | ICD-10-CM | POA: Diagnosis not present

## 2019-12-27 DIAGNOSIS — I69151 Hemiplegia and hemiparesis following nontraumatic intracerebral hemorrhage affecting right dominant side: Secondary | ICD-10-CM | POA: Diagnosis not present

## 2019-12-27 DIAGNOSIS — L89321 Pressure ulcer of left buttock, stage 1: Secondary | ICD-10-CM | POA: Diagnosis not present

## 2019-12-27 DIAGNOSIS — I69122 Dysarthria following nontraumatic intracerebral hemorrhage: Secondary | ICD-10-CM | POA: Diagnosis not present

## 2019-12-27 DIAGNOSIS — N183 Chronic kidney disease, stage 3 unspecified: Secondary | ICD-10-CM | POA: Diagnosis not present

## 2019-12-27 DIAGNOSIS — G4733 Obstructive sleep apnea (adult) (pediatric): Secondary | ICD-10-CM | POA: Diagnosis not present

## 2019-12-27 DIAGNOSIS — L89311 Pressure ulcer of right buttock, stage 1: Secondary | ICD-10-CM | POA: Diagnosis not present

## 2019-12-27 DIAGNOSIS — E1122 Type 2 diabetes mellitus with diabetic chronic kidney disease: Secondary | ICD-10-CM | POA: Diagnosis not present

## 2019-12-27 DIAGNOSIS — I10 Essential (primary) hypertension: Secondary | ICD-10-CM | POA: Diagnosis not present

## 2019-12-28 DIAGNOSIS — E1122 Type 2 diabetes mellitus with diabetic chronic kidney disease: Secondary | ICD-10-CM | POA: Diagnosis not present

## 2019-12-28 DIAGNOSIS — Z9181 History of falling: Secondary | ICD-10-CM | POA: Diagnosis not present

## 2019-12-28 DIAGNOSIS — F329 Major depressive disorder, single episode, unspecified: Secondary | ICD-10-CM | POA: Diagnosis not present

## 2019-12-28 DIAGNOSIS — G4733 Obstructive sleep apnea (adult) (pediatric): Secondary | ICD-10-CM | POA: Diagnosis not present

## 2019-12-28 DIAGNOSIS — I69122 Dysarthria following nontraumatic intracerebral hemorrhage: Secondary | ICD-10-CM | POA: Diagnosis not present

## 2019-12-28 DIAGNOSIS — M4856XD Collapsed vertebra, not elsewhere classified, lumbar region, subsequent encounter for fracture with routine healing: Secondary | ICD-10-CM | POA: Diagnosis not present

## 2019-12-28 DIAGNOSIS — I69151 Hemiplegia and hemiparesis following nontraumatic intracerebral hemorrhage affecting right dominant side: Secondary | ICD-10-CM | POA: Diagnosis not present

## 2019-12-28 DIAGNOSIS — N183 Chronic kidney disease, stage 3 unspecified: Secondary | ICD-10-CM | POA: Diagnosis not present

## 2019-12-28 DIAGNOSIS — L89321 Pressure ulcer of left buttock, stage 1: Secondary | ICD-10-CM | POA: Diagnosis not present

## 2019-12-28 DIAGNOSIS — I10 Essential (primary) hypertension: Secondary | ICD-10-CM | POA: Diagnosis not present

## 2019-12-28 DIAGNOSIS — G8929 Other chronic pain: Secondary | ICD-10-CM | POA: Diagnosis not present

## 2019-12-28 DIAGNOSIS — L89311 Pressure ulcer of right buttock, stage 1: Secondary | ICD-10-CM | POA: Diagnosis not present

## 2019-12-30 DIAGNOSIS — L89311 Pressure ulcer of right buttock, stage 1: Secondary | ICD-10-CM | POA: Diagnosis not present

## 2019-12-30 DIAGNOSIS — N183 Chronic kidney disease, stage 3 unspecified: Secondary | ICD-10-CM | POA: Diagnosis not present

## 2019-12-30 DIAGNOSIS — I10 Essential (primary) hypertension: Secondary | ICD-10-CM | POA: Diagnosis not present

## 2019-12-30 DIAGNOSIS — I69122 Dysarthria following nontraumatic intracerebral hemorrhage: Secondary | ICD-10-CM | POA: Diagnosis not present

## 2019-12-30 DIAGNOSIS — I69151 Hemiplegia and hemiparesis following nontraumatic intracerebral hemorrhage affecting right dominant side: Secondary | ICD-10-CM | POA: Diagnosis not present

## 2019-12-30 DIAGNOSIS — L89321 Pressure ulcer of left buttock, stage 1: Secondary | ICD-10-CM | POA: Diagnosis not present

## 2019-12-30 DIAGNOSIS — F329 Major depressive disorder, single episode, unspecified: Secondary | ICD-10-CM | POA: Diagnosis not present

## 2019-12-30 DIAGNOSIS — E1122 Type 2 diabetes mellitus with diabetic chronic kidney disease: Secondary | ICD-10-CM | POA: Diagnosis not present

## 2019-12-30 DIAGNOSIS — G4733 Obstructive sleep apnea (adult) (pediatric): Secondary | ICD-10-CM | POA: Diagnosis not present

## 2020-01-02 DIAGNOSIS — I69151 Hemiplegia and hemiparesis following nontraumatic intracerebral hemorrhage affecting right dominant side: Secondary | ICD-10-CM | POA: Diagnosis not present

## 2020-01-02 DIAGNOSIS — E1122 Type 2 diabetes mellitus with diabetic chronic kidney disease: Secondary | ICD-10-CM | POA: Diagnosis not present

## 2020-01-02 DIAGNOSIS — I69122 Dysarthria following nontraumatic intracerebral hemorrhage: Secondary | ICD-10-CM | POA: Diagnosis not present

## 2020-01-02 DIAGNOSIS — L89311 Pressure ulcer of right buttock, stage 1: Secondary | ICD-10-CM | POA: Diagnosis not present

## 2020-01-02 DIAGNOSIS — F329 Major depressive disorder, single episode, unspecified: Secondary | ICD-10-CM | POA: Diagnosis not present

## 2020-01-02 DIAGNOSIS — G4733 Obstructive sleep apnea (adult) (pediatric): Secondary | ICD-10-CM | POA: Diagnosis not present

## 2020-01-02 DIAGNOSIS — L89321 Pressure ulcer of left buttock, stage 1: Secondary | ICD-10-CM | POA: Diagnosis not present

## 2020-01-02 DIAGNOSIS — N183 Chronic kidney disease, stage 3 unspecified: Secondary | ICD-10-CM | POA: Diagnosis not present

## 2020-01-02 DIAGNOSIS — I10 Essential (primary) hypertension: Secondary | ICD-10-CM | POA: Diagnosis not present

## 2020-01-03 DIAGNOSIS — F329 Major depressive disorder, single episode, unspecified: Secondary | ICD-10-CM | POA: Diagnosis not present

## 2020-01-03 DIAGNOSIS — E1122 Type 2 diabetes mellitus with diabetic chronic kidney disease: Secondary | ICD-10-CM | POA: Diagnosis not present

## 2020-01-03 DIAGNOSIS — L89321 Pressure ulcer of left buttock, stage 1: Secondary | ICD-10-CM | POA: Diagnosis not present

## 2020-01-03 DIAGNOSIS — I69151 Hemiplegia and hemiparesis following nontraumatic intracerebral hemorrhage affecting right dominant side: Secondary | ICD-10-CM | POA: Diagnosis not present

## 2020-01-03 DIAGNOSIS — N183 Chronic kidney disease, stage 3 unspecified: Secondary | ICD-10-CM | POA: Diagnosis not present

## 2020-01-03 DIAGNOSIS — I69122 Dysarthria following nontraumatic intracerebral hemorrhage: Secondary | ICD-10-CM | POA: Diagnosis not present

## 2020-01-03 DIAGNOSIS — G4733 Obstructive sleep apnea (adult) (pediatric): Secondary | ICD-10-CM | POA: Diagnosis not present

## 2020-01-03 DIAGNOSIS — L89311 Pressure ulcer of right buttock, stage 1: Secondary | ICD-10-CM | POA: Diagnosis not present

## 2020-01-03 DIAGNOSIS — I10 Essential (primary) hypertension: Secondary | ICD-10-CM | POA: Diagnosis not present

## 2020-01-04 DIAGNOSIS — I10 Essential (primary) hypertension: Secondary | ICD-10-CM | POA: Diagnosis not present

## 2020-01-04 DIAGNOSIS — I69122 Dysarthria following nontraumatic intracerebral hemorrhage: Secondary | ICD-10-CM | POA: Diagnosis not present

## 2020-01-04 DIAGNOSIS — E1122 Type 2 diabetes mellitus with diabetic chronic kidney disease: Secondary | ICD-10-CM | POA: Diagnosis not present

## 2020-01-04 DIAGNOSIS — L89311 Pressure ulcer of right buttock, stage 1: Secondary | ICD-10-CM | POA: Diagnosis not present

## 2020-01-04 DIAGNOSIS — I69151 Hemiplegia and hemiparesis following nontraumatic intracerebral hemorrhage affecting right dominant side: Secondary | ICD-10-CM | POA: Diagnosis not present

## 2020-01-04 DIAGNOSIS — L89321 Pressure ulcer of left buttock, stage 1: Secondary | ICD-10-CM | POA: Diagnosis not present

## 2020-01-04 DIAGNOSIS — F329 Major depressive disorder, single episode, unspecified: Secondary | ICD-10-CM | POA: Diagnosis not present

## 2020-01-04 DIAGNOSIS — N183 Chronic kidney disease, stage 3 unspecified: Secondary | ICD-10-CM | POA: Diagnosis not present

## 2020-01-04 DIAGNOSIS — G4733 Obstructive sleep apnea (adult) (pediatric): Secondary | ICD-10-CM | POA: Diagnosis not present

## 2020-01-05 DIAGNOSIS — E1169 Type 2 diabetes mellitus with other specified complication: Secondary | ICD-10-CM | POA: Diagnosis not present

## 2020-01-05 DIAGNOSIS — E1122 Type 2 diabetes mellitus with diabetic chronic kidney disease: Secondary | ICD-10-CM | POA: Diagnosis not present

## 2020-01-05 DIAGNOSIS — F3341 Major depressive disorder, recurrent, in partial remission: Secondary | ICD-10-CM | POA: Diagnosis not present

## 2020-01-05 DIAGNOSIS — E785 Hyperlipidemia, unspecified: Secondary | ICD-10-CM | POA: Diagnosis not present

## 2020-01-05 DIAGNOSIS — I10 Essential (primary) hypertension: Secondary | ICD-10-CM | POA: Diagnosis not present

## 2020-01-06 DIAGNOSIS — I69122 Dysarthria following nontraumatic intracerebral hemorrhage: Secondary | ICD-10-CM | POA: Diagnosis not present

## 2020-01-06 DIAGNOSIS — L89321 Pressure ulcer of left buttock, stage 1: Secondary | ICD-10-CM | POA: Diagnosis not present

## 2020-01-06 DIAGNOSIS — I69151 Hemiplegia and hemiparesis following nontraumatic intracerebral hemorrhage affecting right dominant side: Secondary | ICD-10-CM | POA: Diagnosis not present

## 2020-01-06 DIAGNOSIS — F329 Major depressive disorder, single episode, unspecified: Secondary | ICD-10-CM | POA: Diagnosis not present

## 2020-01-06 DIAGNOSIS — E1122 Type 2 diabetes mellitus with diabetic chronic kidney disease: Secondary | ICD-10-CM | POA: Diagnosis not present

## 2020-01-06 DIAGNOSIS — N183 Chronic kidney disease, stage 3 unspecified: Secondary | ICD-10-CM | POA: Diagnosis not present

## 2020-01-06 DIAGNOSIS — I10 Essential (primary) hypertension: Secondary | ICD-10-CM | POA: Diagnosis not present

## 2020-01-06 DIAGNOSIS — G4733 Obstructive sleep apnea (adult) (pediatric): Secondary | ICD-10-CM | POA: Diagnosis not present

## 2020-01-06 DIAGNOSIS — L89311 Pressure ulcer of right buttock, stage 1: Secondary | ICD-10-CM | POA: Diagnosis not present

## 2020-01-09 DIAGNOSIS — L89311 Pressure ulcer of right buttock, stage 1: Secondary | ICD-10-CM | POA: Diagnosis not present

## 2020-01-09 DIAGNOSIS — E1122 Type 2 diabetes mellitus with diabetic chronic kidney disease: Secondary | ICD-10-CM | POA: Diagnosis not present

## 2020-01-09 DIAGNOSIS — I10 Essential (primary) hypertension: Secondary | ICD-10-CM | POA: Diagnosis not present

## 2020-01-09 DIAGNOSIS — G4733 Obstructive sleep apnea (adult) (pediatric): Secondary | ICD-10-CM | POA: Diagnosis not present

## 2020-01-09 DIAGNOSIS — L89321 Pressure ulcer of left buttock, stage 1: Secondary | ICD-10-CM | POA: Diagnosis not present

## 2020-01-09 DIAGNOSIS — I69151 Hemiplegia and hemiparesis following nontraumatic intracerebral hemorrhage affecting right dominant side: Secondary | ICD-10-CM | POA: Diagnosis not present

## 2020-01-09 DIAGNOSIS — I69122 Dysarthria following nontraumatic intracerebral hemorrhage: Secondary | ICD-10-CM | POA: Diagnosis not present

## 2020-01-09 DIAGNOSIS — F329 Major depressive disorder, single episode, unspecified: Secondary | ICD-10-CM | POA: Diagnosis not present

## 2020-01-09 DIAGNOSIS — N183 Chronic kidney disease, stage 3 unspecified: Secondary | ICD-10-CM | POA: Diagnosis not present

## 2020-01-10 DIAGNOSIS — L89311 Pressure ulcer of right buttock, stage 1: Secondary | ICD-10-CM | POA: Diagnosis not present

## 2020-01-10 DIAGNOSIS — I69151 Hemiplegia and hemiparesis following nontraumatic intracerebral hemorrhage affecting right dominant side: Secondary | ICD-10-CM | POA: Diagnosis not present

## 2020-01-10 DIAGNOSIS — E1122 Type 2 diabetes mellitus with diabetic chronic kidney disease: Secondary | ICD-10-CM | POA: Diagnosis not present

## 2020-01-10 DIAGNOSIS — L89321 Pressure ulcer of left buttock, stage 1: Secondary | ICD-10-CM | POA: Diagnosis not present

## 2020-01-10 DIAGNOSIS — F329 Major depressive disorder, single episode, unspecified: Secondary | ICD-10-CM | POA: Diagnosis not present

## 2020-01-10 DIAGNOSIS — N183 Chronic kidney disease, stage 3 unspecified: Secondary | ICD-10-CM | POA: Diagnosis not present

## 2020-01-10 DIAGNOSIS — I10 Essential (primary) hypertension: Secondary | ICD-10-CM | POA: Diagnosis not present

## 2020-01-10 DIAGNOSIS — G4733 Obstructive sleep apnea (adult) (pediatric): Secondary | ICD-10-CM | POA: Diagnosis not present

## 2020-01-10 DIAGNOSIS — I69122 Dysarthria following nontraumatic intracerebral hemorrhage: Secondary | ICD-10-CM | POA: Diagnosis not present

## 2020-01-11 DIAGNOSIS — F329 Major depressive disorder, single episode, unspecified: Secondary | ICD-10-CM | POA: Diagnosis not present

## 2020-01-11 DIAGNOSIS — L89311 Pressure ulcer of right buttock, stage 1: Secondary | ICD-10-CM | POA: Diagnosis not present

## 2020-01-11 DIAGNOSIS — I10 Essential (primary) hypertension: Secondary | ICD-10-CM | POA: Diagnosis not present

## 2020-01-11 DIAGNOSIS — N183 Chronic kidney disease, stage 3 unspecified: Secondary | ICD-10-CM | POA: Diagnosis not present

## 2020-01-11 DIAGNOSIS — E1122 Type 2 diabetes mellitus with diabetic chronic kidney disease: Secondary | ICD-10-CM | POA: Diagnosis not present

## 2020-01-11 DIAGNOSIS — I69122 Dysarthria following nontraumatic intracerebral hemorrhage: Secondary | ICD-10-CM | POA: Diagnosis not present

## 2020-01-11 DIAGNOSIS — I69151 Hemiplegia and hemiparesis following nontraumatic intracerebral hemorrhage affecting right dominant side: Secondary | ICD-10-CM | POA: Diagnosis not present

## 2020-01-11 DIAGNOSIS — G4733 Obstructive sleep apnea (adult) (pediatric): Secondary | ICD-10-CM | POA: Diagnosis not present

## 2020-01-11 DIAGNOSIS — L89321 Pressure ulcer of left buttock, stage 1: Secondary | ICD-10-CM | POA: Diagnosis not present

## 2020-01-12 DIAGNOSIS — L89311 Pressure ulcer of right buttock, stage 1: Secondary | ICD-10-CM | POA: Diagnosis not present

## 2020-01-12 DIAGNOSIS — I69151 Hemiplegia and hemiparesis following nontraumatic intracerebral hemorrhage affecting right dominant side: Secondary | ICD-10-CM | POA: Diagnosis not present

## 2020-01-12 DIAGNOSIS — G4733 Obstructive sleep apnea (adult) (pediatric): Secondary | ICD-10-CM | POA: Diagnosis not present

## 2020-01-12 DIAGNOSIS — N183 Chronic kidney disease, stage 3 unspecified: Secondary | ICD-10-CM | POA: Diagnosis not present

## 2020-01-12 DIAGNOSIS — L89321 Pressure ulcer of left buttock, stage 1: Secondary | ICD-10-CM | POA: Diagnosis not present

## 2020-01-12 DIAGNOSIS — F329 Major depressive disorder, single episode, unspecified: Secondary | ICD-10-CM | POA: Diagnosis not present

## 2020-01-12 DIAGNOSIS — E1122 Type 2 diabetes mellitus with diabetic chronic kidney disease: Secondary | ICD-10-CM | POA: Diagnosis not present

## 2020-01-12 DIAGNOSIS — I69122 Dysarthria following nontraumatic intracerebral hemorrhage: Secondary | ICD-10-CM | POA: Diagnosis not present

## 2020-01-12 DIAGNOSIS — I10 Essential (primary) hypertension: Secondary | ICD-10-CM | POA: Diagnosis not present

## 2020-01-13 DIAGNOSIS — L89321 Pressure ulcer of left buttock, stage 1: Secondary | ICD-10-CM | POA: Diagnosis not present

## 2020-01-13 DIAGNOSIS — L89311 Pressure ulcer of right buttock, stage 1: Secondary | ICD-10-CM | POA: Diagnosis not present

## 2020-01-13 DIAGNOSIS — I69151 Hemiplegia and hemiparesis following nontraumatic intracerebral hemorrhage affecting right dominant side: Secondary | ICD-10-CM | POA: Diagnosis not present

## 2020-01-13 DIAGNOSIS — I69122 Dysarthria following nontraumatic intracerebral hemorrhage: Secondary | ICD-10-CM | POA: Diagnosis not present

## 2020-01-13 DIAGNOSIS — E1122 Type 2 diabetes mellitus with diabetic chronic kidney disease: Secondary | ICD-10-CM | POA: Diagnosis not present

## 2020-01-13 DIAGNOSIS — N183 Chronic kidney disease, stage 3 unspecified: Secondary | ICD-10-CM | POA: Diagnosis not present

## 2020-01-13 DIAGNOSIS — I10 Essential (primary) hypertension: Secondary | ICD-10-CM | POA: Diagnosis not present

## 2020-01-13 DIAGNOSIS — F329 Major depressive disorder, single episode, unspecified: Secondary | ICD-10-CM | POA: Diagnosis not present

## 2020-01-13 DIAGNOSIS — G4733 Obstructive sleep apnea (adult) (pediatric): Secondary | ICD-10-CM | POA: Diagnosis not present

## 2020-01-17 DIAGNOSIS — I69151 Hemiplegia and hemiparesis following nontraumatic intracerebral hemorrhage affecting right dominant side: Secondary | ICD-10-CM | POA: Diagnosis not present

## 2020-01-17 DIAGNOSIS — I10 Essential (primary) hypertension: Secondary | ICD-10-CM | POA: Diagnosis not present

## 2020-01-17 DIAGNOSIS — E1122 Type 2 diabetes mellitus with diabetic chronic kidney disease: Secondary | ICD-10-CM | POA: Diagnosis not present

## 2020-01-17 DIAGNOSIS — L89321 Pressure ulcer of left buttock, stage 1: Secondary | ICD-10-CM | POA: Diagnosis not present

## 2020-01-17 DIAGNOSIS — N183 Chronic kidney disease, stage 3 unspecified: Secondary | ICD-10-CM | POA: Diagnosis not present

## 2020-01-17 DIAGNOSIS — I69122 Dysarthria following nontraumatic intracerebral hemorrhage: Secondary | ICD-10-CM | POA: Diagnosis not present

## 2020-01-17 DIAGNOSIS — F329 Major depressive disorder, single episode, unspecified: Secondary | ICD-10-CM | POA: Diagnosis not present

## 2020-01-17 DIAGNOSIS — L89311 Pressure ulcer of right buttock, stage 1: Secondary | ICD-10-CM | POA: Diagnosis not present

## 2020-01-17 DIAGNOSIS — G4733 Obstructive sleep apnea (adult) (pediatric): Secondary | ICD-10-CM | POA: Diagnosis not present

## 2020-01-18 DIAGNOSIS — E1122 Type 2 diabetes mellitus with diabetic chronic kidney disease: Secondary | ICD-10-CM | POA: Diagnosis not present

## 2020-01-18 DIAGNOSIS — G4733 Obstructive sleep apnea (adult) (pediatric): Secondary | ICD-10-CM | POA: Diagnosis not present

## 2020-01-18 DIAGNOSIS — I69122 Dysarthria following nontraumatic intracerebral hemorrhage: Secondary | ICD-10-CM | POA: Diagnosis not present

## 2020-01-18 DIAGNOSIS — L89321 Pressure ulcer of left buttock, stage 1: Secondary | ICD-10-CM | POA: Diagnosis not present

## 2020-01-18 DIAGNOSIS — N183 Chronic kidney disease, stage 3 unspecified: Secondary | ICD-10-CM | POA: Diagnosis not present

## 2020-01-18 DIAGNOSIS — I10 Essential (primary) hypertension: Secondary | ICD-10-CM | POA: Diagnosis not present

## 2020-01-18 DIAGNOSIS — I69151 Hemiplegia and hemiparesis following nontraumatic intracerebral hemorrhage affecting right dominant side: Secondary | ICD-10-CM | POA: Diagnosis not present

## 2020-01-18 DIAGNOSIS — L89311 Pressure ulcer of right buttock, stage 1: Secondary | ICD-10-CM | POA: Diagnosis not present

## 2020-01-18 DIAGNOSIS — F329 Major depressive disorder, single episode, unspecified: Secondary | ICD-10-CM | POA: Diagnosis not present

## 2020-01-19 DIAGNOSIS — L89311 Pressure ulcer of right buttock, stage 1: Secondary | ICD-10-CM | POA: Diagnosis not present

## 2020-01-19 DIAGNOSIS — I69151 Hemiplegia and hemiparesis following nontraumatic intracerebral hemorrhage affecting right dominant side: Secondary | ICD-10-CM | POA: Diagnosis not present

## 2020-01-19 DIAGNOSIS — I10 Essential (primary) hypertension: Secondary | ICD-10-CM | POA: Diagnosis not present

## 2020-01-19 DIAGNOSIS — F329 Major depressive disorder, single episode, unspecified: Secondary | ICD-10-CM | POA: Diagnosis not present

## 2020-01-19 DIAGNOSIS — N183 Chronic kidney disease, stage 3 unspecified: Secondary | ICD-10-CM | POA: Diagnosis not present

## 2020-01-19 DIAGNOSIS — L89321 Pressure ulcer of left buttock, stage 1: Secondary | ICD-10-CM | POA: Diagnosis not present

## 2020-01-19 DIAGNOSIS — I69122 Dysarthria following nontraumatic intracerebral hemorrhage: Secondary | ICD-10-CM | POA: Diagnosis not present

## 2020-01-19 DIAGNOSIS — G4733 Obstructive sleep apnea (adult) (pediatric): Secondary | ICD-10-CM | POA: Diagnosis not present

## 2020-01-19 DIAGNOSIS — E1122 Type 2 diabetes mellitus with diabetic chronic kidney disease: Secondary | ICD-10-CM | POA: Diagnosis not present

## 2020-01-20 DIAGNOSIS — I10 Essential (primary) hypertension: Secondary | ICD-10-CM | POA: Diagnosis not present

## 2020-01-20 DIAGNOSIS — G4733 Obstructive sleep apnea (adult) (pediatric): Secondary | ICD-10-CM | POA: Diagnosis not present

## 2020-01-20 DIAGNOSIS — I69151 Hemiplegia and hemiparesis following nontraumatic intracerebral hemorrhage affecting right dominant side: Secondary | ICD-10-CM | POA: Diagnosis not present

## 2020-01-20 DIAGNOSIS — F329 Major depressive disorder, single episode, unspecified: Secondary | ICD-10-CM | POA: Diagnosis not present

## 2020-01-20 DIAGNOSIS — N183 Chronic kidney disease, stage 3 unspecified: Secondary | ICD-10-CM | POA: Diagnosis not present

## 2020-01-20 DIAGNOSIS — L89321 Pressure ulcer of left buttock, stage 1: Secondary | ICD-10-CM | POA: Diagnosis not present

## 2020-01-20 DIAGNOSIS — E1122 Type 2 diabetes mellitus with diabetic chronic kidney disease: Secondary | ICD-10-CM | POA: Diagnosis not present

## 2020-01-20 DIAGNOSIS — L89311 Pressure ulcer of right buttock, stage 1: Secondary | ICD-10-CM | POA: Diagnosis not present

## 2020-01-20 DIAGNOSIS — I69122 Dysarthria following nontraumatic intracerebral hemorrhage: Secondary | ICD-10-CM | POA: Diagnosis not present

## 2020-01-24 DIAGNOSIS — F329 Major depressive disorder, single episode, unspecified: Secondary | ICD-10-CM | POA: Diagnosis not present

## 2020-01-24 DIAGNOSIS — I69151 Hemiplegia and hemiparesis following nontraumatic intracerebral hemorrhage affecting right dominant side: Secondary | ICD-10-CM | POA: Diagnosis not present

## 2020-01-24 DIAGNOSIS — I10 Essential (primary) hypertension: Secondary | ICD-10-CM | POA: Diagnosis not present

## 2020-01-24 DIAGNOSIS — N183 Chronic kidney disease, stage 3 unspecified: Secondary | ICD-10-CM | POA: Diagnosis not present

## 2020-01-24 DIAGNOSIS — L89311 Pressure ulcer of right buttock, stage 1: Secondary | ICD-10-CM | POA: Diagnosis not present

## 2020-01-24 DIAGNOSIS — I69122 Dysarthria following nontraumatic intracerebral hemorrhage: Secondary | ICD-10-CM | POA: Diagnosis not present

## 2020-01-24 DIAGNOSIS — E1122 Type 2 diabetes mellitus with diabetic chronic kidney disease: Secondary | ICD-10-CM | POA: Diagnosis not present

## 2020-01-24 DIAGNOSIS — L89321 Pressure ulcer of left buttock, stage 1: Secondary | ICD-10-CM | POA: Diagnosis not present

## 2020-01-24 DIAGNOSIS — G4733 Obstructive sleep apnea (adult) (pediatric): Secondary | ICD-10-CM | POA: Diagnosis not present

## 2020-01-25 DIAGNOSIS — G4733 Obstructive sleep apnea (adult) (pediatric): Secondary | ICD-10-CM | POA: Diagnosis not present

## 2020-01-25 DIAGNOSIS — G8929 Other chronic pain: Secondary | ICD-10-CM | POA: Diagnosis not present

## 2020-01-25 DIAGNOSIS — I69122 Dysarthria following nontraumatic intracerebral hemorrhage: Secondary | ICD-10-CM | POA: Diagnosis not present

## 2020-01-25 DIAGNOSIS — L89321 Pressure ulcer of left buttock, stage 1: Secondary | ICD-10-CM | POA: Diagnosis not present

## 2020-01-25 DIAGNOSIS — E1122 Type 2 diabetes mellitus with diabetic chronic kidney disease: Secondary | ICD-10-CM | POA: Diagnosis not present

## 2020-01-25 DIAGNOSIS — I69151 Hemiplegia and hemiparesis following nontraumatic intracerebral hemorrhage affecting right dominant side: Secondary | ICD-10-CM | POA: Diagnosis not present

## 2020-01-25 DIAGNOSIS — Z9181 History of falling: Secondary | ICD-10-CM | POA: Diagnosis not present

## 2020-01-25 DIAGNOSIS — L89311 Pressure ulcer of right buttock, stage 1: Secondary | ICD-10-CM | POA: Diagnosis not present

## 2020-01-25 DIAGNOSIS — F329 Major depressive disorder, single episode, unspecified: Secondary | ICD-10-CM | POA: Diagnosis not present

## 2020-01-25 DIAGNOSIS — N183 Chronic kidney disease, stage 3 unspecified: Secondary | ICD-10-CM | POA: Diagnosis not present

## 2020-01-25 DIAGNOSIS — M4856XD Collapsed vertebra, not elsewhere classified, lumbar region, subsequent encounter for fracture with routine healing: Secondary | ICD-10-CM | POA: Diagnosis not present

## 2020-01-25 DIAGNOSIS — E785 Hyperlipidemia, unspecified: Secondary | ICD-10-CM | POA: Diagnosis not present

## 2020-01-25 DIAGNOSIS — E1169 Type 2 diabetes mellitus with other specified complication: Secondary | ICD-10-CM | POA: Diagnosis not present

## 2020-01-25 DIAGNOSIS — I10 Essential (primary) hypertension: Secondary | ICD-10-CM | POA: Diagnosis not present

## 2020-01-25 DIAGNOSIS — F3341 Major depressive disorder, recurrent, in partial remission: Secondary | ICD-10-CM | POA: Diagnosis not present

## 2020-01-27 DIAGNOSIS — G4733 Obstructive sleep apnea (adult) (pediatric): Secondary | ICD-10-CM | POA: Diagnosis not present

## 2020-01-27 DIAGNOSIS — F329 Major depressive disorder, single episode, unspecified: Secondary | ICD-10-CM | POA: Diagnosis not present

## 2020-01-27 DIAGNOSIS — L89321 Pressure ulcer of left buttock, stage 1: Secondary | ICD-10-CM | POA: Diagnosis not present

## 2020-01-27 DIAGNOSIS — I69122 Dysarthria following nontraumatic intracerebral hemorrhage: Secondary | ICD-10-CM | POA: Diagnosis not present

## 2020-01-27 DIAGNOSIS — N183 Chronic kidney disease, stage 3 unspecified: Secondary | ICD-10-CM | POA: Diagnosis not present

## 2020-01-27 DIAGNOSIS — L89311 Pressure ulcer of right buttock, stage 1: Secondary | ICD-10-CM | POA: Diagnosis not present

## 2020-01-27 DIAGNOSIS — E1122 Type 2 diabetes mellitus with diabetic chronic kidney disease: Secondary | ICD-10-CM | POA: Diagnosis not present

## 2020-01-27 DIAGNOSIS — I10 Essential (primary) hypertension: Secondary | ICD-10-CM | POA: Diagnosis not present

## 2020-01-27 DIAGNOSIS — I69151 Hemiplegia and hemiparesis following nontraumatic intracerebral hemorrhage affecting right dominant side: Secondary | ICD-10-CM | POA: Diagnosis not present

## 2020-01-31 DIAGNOSIS — I69151 Hemiplegia and hemiparesis following nontraumatic intracerebral hemorrhage affecting right dominant side: Secondary | ICD-10-CM | POA: Diagnosis not present

## 2020-01-31 DIAGNOSIS — I69122 Dysarthria following nontraumatic intracerebral hemorrhage: Secondary | ICD-10-CM | POA: Diagnosis not present

## 2020-01-31 DIAGNOSIS — L89311 Pressure ulcer of right buttock, stage 1: Secondary | ICD-10-CM | POA: Diagnosis not present

## 2020-01-31 DIAGNOSIS — I10 Essential (primary) hypertension: Secondary | ICD-10-CM | POA: Diagnosis not present

## 2020-01-31 DIAGNOSIS — E1122 Type 2 diabetes mellitus with diabetic chronic kidney disease: Secondary | ICD-10-CM | POA: Diagnosis not present

## 2020-01-31 DIAGNOSIS — F329 Major depressive disorder, single episode, unspecified: Secondary | ICD-10-CM | POA: Diagnosis not present

## 2020-01-31 DIAGNOSIS — G4733 Obstructive sleep apnea (adult) (pediatric): Secondary | ICD-10-CM | POA: Diagnosis not present

## 2020-01-31 DIAGNOSIS — L89321 Pressure ulcer of left buttock, stage 1: Secondary | ICD-10-CM | POA: Diagnosis not present

## 2020-01-31 DIAGNOSIS — N183 Chronic kidney disease, stage 3 unspecified: Secondary | ICD-10-CM | POA: Diagnosis not present

## 2020-02-03 DIAGNOSIS — I10 Essential (primary) hypertension: Secondary | ICD-10-CM | POA: Diagnosis not present

## 2020-02-03 DIAGNOSIS — N183 Chronic kidney disease, stage 3 unspecified: Secondary | ICD-10-CM | POA: Diagnosis not present

## 2020-02-03 DIAGNOSIS — I69122 Dysarthria following nontraumatic intracerebral hemorrhage: Secondary | ICD-10-CM | POA: Diagnosis not present

## 2020-02-03 DIAGNOSIS — G4733 Obstructive sleep apnea (adult) (pediatric): Secondary | ICD-10-CM | POA: Diagnosis not present

## 2020-02-03 DIAGNOSIS — E1122 Type 2 diabetes mellitus with diabetic chronic kidney disease: Secondary | ICD-10-CM | POA: Diagnosis not present

## 2020-02-03 DIAGNOSIS — L89311 Pressure ulcer of right buttock, stage 1: Secondary | ICD-10-CM | POA: Diagnosis not present

## 2020-02-03 DIAGNOSIS — I69151 Hemiplegia and hemiparesis following nontraumatic intracerebral hemorrhage affecting right dominant side: Secondary | ICD-10-CM | POA: Diagnosis not present

## 2020-02-03 DIAGNOSIS — L89321 Pressure ulcer of left buttock, stage 1: Secondary | ICD-10-CM | POA: Diagnosis not present

## 2020-02-03 DIAGNOSIS — F329 Major depressive disorder, single episode, unspecified: Secondary | ICD-10-CM | POA: Diagnosis not present

## 2020-02-05 DIAGNOSIS — I69122 Dysarthria following nontraumatic intracerebral hemorrhage: Secondary | ICD-10-CM | POA: Diagnosis not present

## 2020-02-05 DIAGNOSIS — G4733 Obstructive sleep apnea (adult) (pediatric): Secondary | ICD-10-CM | POA: Diagnosis not present

## 2020-02-05 DIAGNOSIS — L89312 Pressure ulcer of right buttock, stage 2: Secondary | ICD-10-CM | POA: Diagnosis not present

## 2020-02-05 DIAGNOSIS — F329 Major depressive disorder, single episode, unspecified: Secondary | ICD-10-CM | POA: Diagnosis not present

## 2020-02-05 DIAGNOSIS — I69151 Hemiplegia and hemiparesis following nontraumatic intracerebral hemorrhage affecting right dominant side: Secondary | ICD-10-CM | POA: Diagnosis not present

## 2020-02-05 DIAGNOSIS — E1122 Type 2 diabetes mellitus with diabetic chronic kidney disease: Secondary | ICD-10-CM | POA: Diagnosis not present

## 2020-02-05 DIAGNOSIS — N183 Chronic kidney disease, stage 3 unspecified: Secondary | ICD-10-CM | POA: Diagnosis not present

## 2020-02-05 DIAGNOSIS — I10 Essential (primary) hypertension: Secondary | ICD-10-CM | POA: Diagnosis not present

## 2020-02-05 DIAGNOSIS — L89322 Pressure ulcer of left buttock, stage 2: Secondary | ICD-10-CM | POA: Diagnosis not present

## 2020-02-07 DIAGNOSIS — L89322 Pressure ulcer of left buttock, stage 2: Secondary | ICD-10-CM | POA: Diagnosis not present

## 2020-02-07 DIAGNOSIS — N183 Chronic kidney disease, stage 3 unspecified: Secondary | ICD-10-CM | POA: Diagnosis not present

## 2020-02-07 DIAGNOSIS — L89312 Pressure ulcer of right buttock, stage 2: Secondary | ICD-10-CM | POA: Diagnosis not present

## 2020-02-07 DIAGNOSIS — F329 Major depressive disorder, single episode, unspecified: Secondary | ICD-10-CM | POA: Diagnosis not present

## 2020-02-07 DIAGNOSIS — G4733 Obstructive sleep apnea (adult) (pediatric): Secondary | ICD-10-CM | POA: Diagnosis not present

## 2020-02-07 DIAGNOSIS — I69122 Dysarthria following nontraumatic intracerebral hemorrhage: Secondary | ICD-10-CM | POA: Diagnosis not present

## 2020-02-07 DIAGNOSIS — I69151 Hemiplegia and hemiparesis following nontraumatic intracerebral hemorrhage affecting right dominant side: Secondary | ICD-10-CM | POA: Diagnosis not present

## 2020-02-07 DIAGNOSIS — I10 Essential (primary) hypertension: Secondary | ICD-10-CM | POA: Diagnosis not present

## 2020-02-07 DIAGNOSIS — E1122 Type 2 diabetes mellitus with diabetic chronic kidney disease: Secondary | ICD-10-CM | POA: Diagnosis not present

## 2020-02-10 DIAGNOSIS — L89312 Pressure ulcer of right buttock, stage 2: Secondary | ICD-10-CM | POA: Diagnosis not present

## 2020-02-10 DIAGNOSIS — N183 Chronic kidney disease, stage 3 unspecified: Secondary | ICD-10-CM | POA: Diagnosis not present

## 2020-02-10 DIAGNOSIS — L89322 Pressure ulcer of left buttock, stage 2: Secondary | ICD-10-CM | POA: Diagnosis not present

## 2020-02-10 DIAGNOSIS — I10 Essential (primary) hypertension: Secondary | ICD-10-CM | POA: Diagnosis not present

## 2020-02-10 DIAGNOSIS — G4733 Obstructive sleep apnea (adult) (pediatric): Secondary | ICD-10-CM | POA: Diagnosis not present

## 2020-02-10 DIAGNOSIS — E1122 Type 2 diabetes mellitus with diabetic chronic kidney disease: Secondary | ICD-10-CM | POA: Diagnosis not present

## 2020-02-10 DIAGNOSIS — F329 Major depressive disorder, single episode, unspecified: Secondary | ICD-10-CM | POA: Diagnosis not present

## 2020-02-10 DIAGNOSIS — I69151 Hemiplegia and hemiparesis following nontraumatic intracerebral hemorrhage affecting right dominant side: Secondary | ICD-10-CM | POA: Diagnosis not present

## 2020-02-10 DIAGNOSIS — I69122 Dysarthria following nontraumatic intracerebral hemorrhage: Secondary | ICD-10-CM | POA: Diagnosis not present

## 2020-02-14 DIAGNOSIS — I69122 Dysarthria following nontraumatic intracerebral hemorrhage: Secondary | ICD-10-CM | POA: Diagnosis not present

## 2020-02-14 DIAGNOSIS — N183 Chronic kidney disease, stage 3 unspecified: Secondary | ICD-10-CM | POA: Diagnosis not present

## 2020-02-14 DIAGNOSIS — E1122 Type 2 diabetes mellitus with diabetic chronic kidney disease: Secondary | ICD-10-CM | POA: Diagnosis not present

## 2020-02-14 DIAGNOSIS — G4733 Obstructive sleep apnea (adult) (pediatric): Secondary | ICD-10-CM | POA: Diagnosis not present

## 2020-02-14 DIAGNOSIS — L89322 Pressure ulcer of left buttock, stage 2: Secondary | ICD-10-CM | POA: Diagnosis not present

## 2020-02-14 DIAGNOSIS — F329 Major depressive disorder, single episode, unspecified: Secondary | ICD-10-CM | POA: Diagnosis not present

## 2020-02-14 DIAGNOSIS — I10 Essential (primary) hypertension: Secondary | ICD-10-CM | POA: Diagnosis not present

## 2020-02-14 DIAGNOSIS — L89312 Pressure ulcer of right buttock, stage 2: Secondary | ICD-10-CM | POA: Diagnosis not present

## 2020-02-14 DIAGNOSIS — I69151 Hemiplegia and hemiparesis following nontraumatic intracerebral hemorrhage affecting right dominant side: Secondary | ICD-10-CM | POA: Diagnosis not present

## 2020-02-17 DIAGNOSIS — G4733 Obstructive sleep apnea (adult) (pediatric): Secondary | ICD-10-CM | POA: Diagnosis not present

## 2020-02-17 DIAGNOSIS — L89312 Pressure ulcer of right buttock, stage 2: Secondary | ICD-10-CM | POA: Diagnosis not present

## 2020-02-17 DIAGNOSIS — I10 Essential (primary) hypertension: Secondary | ICD-10-CM | POA: Diagnosis not present

## 2020-02-17 DIAGNOSIS — N183 Chronic kidney disease, stage 3 unspecified: Secondary | ICD-10-CM | POA: Diagnosis not present

## 2020-02-17 DIAGNOSIS — F329 Major depressive disorder, single episode, unspecified: Secondary | ICD-10-CM | POA: Diagnosis not present

## 2020-02-17 DIAGNOSIS — E1122 Type 2 diabetes mellitus with diabetic chronic kidney disease: Secondary | ICD-10-CM | POA: Diagnosis not present

## 2020-02-17 DIAGNOSIS — I69122 Dysarthria following nontraumatic intracerebral hemorrhage: Secondary | ICD-10-CM | POA: Diagnosis not present

## 2020-02-17 DIAGNOSIS — I69151 Hemiplegia and hemiparesis following nontraumatic intracerebral hemorrhage affecting right dominant side: Secondary | ICD-10-CM | POA: Diagnosis not present

## 2020-02-17 DIAGNOSIS — L89322 Pressure ulcer of left buttock, stage 2: Secondary | ICD-10-CM | POA: Diagnosis not present

## 2020-02-21 DIAGNOSIS — L89312 Pressure ulcer of right buttock, stage 2: Secondary | ICD-10-CM | POA: Diagnosis not present

## 2020-02-21 DIAGNOSIS — L89322 Pressure ulcer of left buttock, stage 2: Secondary | ICD-10-CM | POA: Diagnosis not present

## 2020-02-21 DIAGNOSIS — F329 Major depressive disorder, single episode, unspecified: Secondary | ICD-10-CM | POA: Diagnosis not present

## 2020-02-21 DIAGNOSIS — I69151 Hemiplegia and hemiparesis following nontraumatic intracerebral hemorrhage affecting right dominant side: Secondary | ICD-10-CM | POA: Diagnosis not present

## 2020-02-21 DIAGNOSIS — E1122 Type 2 diabetes mellitus with diabetic chronic kidney disease: Secondary | ICD-10-CM | POA: Diagnosis not present

## 2020-02-21 DIAGNOSIS — I69122 Dysarthria following nontraumatic intracerebral hemorrhage: Secondary | ICD-10-CM | POA: Diagnosis not present

## 2020-02-21 DIAGNOSIS — N183 Chronic kidney disease, stage 3 unspecified: Secondary | ICD-10-CM | POA: Diagnosis not present

## 2020-02-21 DIAGNOSIS — G4733 Obstructive sleep apnea (adult) (pediatric): Secondary | ICD-10-CM | POA: Diagnosis not present

## 2020-02-21 DIAGNOSIS — I10 Essential (primary) hypertension: Secondary | ICD-10-CM | POA: Diagnosis not present

## 2020-02-22 DIAGNOSIS — I10 Essential (primary) hypertension: Secondary | ICD-10-CM | POA: Diagnosis not present

## 2020-02-22 DIAGNOSIS — L89322 Pressure ulcer of left buttock, stage 2: Secondary | ICD-10-CM | POA: Diagnosis not present

## 2020-02-22 DIAGNOSIS — I69122 Dysarthria following nontraumatic intracerebral hemorrhage: Secondary | ICD-10-CM | POA: Diagnosis not present

## 2020-02-22 DIAGNOSIS — F329 Major depressive disorder, single episode, unspecified: Secondary | ICD-10-CM | POA: Diagnosis not present

## 2020-02-22 DIAGNOSIS — I69151 Hemiplegia and hemiparesis following nontraumatic intracerebral hemorrhage affecting right dominant side: Secondary | ICD-10-CM | POA: Diagnosis not present

## 2020-02-22 DIAGNOSIS — L89312 Pressure ulcer of right buttock, stage 2: Secondary | ICD-10-CM | POA: Diagnosis not present

## 2020-02-22 DIAGNOSIS — E1122 Type 2 diabetes mellitus with diabetic chronic kidney disease: Secondary | ICD-10-CM | POA: Diagnosis not present

## 2020-02-22 DIAGNOSIS — G4733 Obstructive sleep apnea (adult) (pediatric): Secondary | ICD-10-CM | POA: Diagnosis not present

## 2020-02-22 DIAGNOSIS — N183 Chronic kidney disease, stage 3 unspecified: Secondary | ICD-10-CM | POA: Diagnosis not present

## 2020-02-23 ENCOUNTER — Other Ambulatory Visit: Payer: Self-pay

## 2020-02-23 ENCOUNTER — Ambulatory Visit (INDEPENDENT_AMBULATORY_CARE_PROVIDER_SITE_OTHER): Payer: Medicare HMO | Admitting: Psychiatry

## 2020-02-23 DIAGNOSIS — Z634 Disappearance and death of family member: Secondary | ICD-10-CM

## 2020-02-23 DIAGNOSIS — Z8673 Personal history of transient ischemic attack (TIA), and cerebral infarction without residual deficits: Secondary | ICD-10-CM

## 2020-02-23 DIAGNOSIS — F4320 Adjustment disorder, unspecified: Secondary | ICD-10-CM | POA: Diagnosis not present

## 2020-02-23 DIAGNOSIS — F331 Major depressive disorder, recurrent, moderate: Secondary | ICD-10-CM

## 2020-02-23 DIAGNOSIS — F411 Generalized anxiety disorder: Secondary | ICD-10-CM | POA: Diagnosis not present

## 2020-02-23 NOTE — Progress Notes (Signed)
PROBLEM-FOCUSED INITIAL PSYCHOTHERAPY EVALUATION Luan Moore, PhD LP [facility]  Name: Dakota Hall [facility]) Date: 02/23/2020 Time spent: 50 min MRN: [id number] DOB: 10/09/1951 Guardian/Payee: self  PCP: Leeroy Cha, MD Documentation requested on this visit: No  PROBLEM HISTORY Reason for Visit /Presenting Problem:  Chief Complaint  Patient presents with  . Depression  . Establish Care   Narrative/History of Present Illness Referred by self and Dr. Clovis Pu for treatment of depression related to bereavement.  Former Pt of [facility], unable to reimburse at this time due to Medicare's non-coverage of Operating Room Services.  Ongoing Pt of Dr. Clovis Pu, Dx MDD-R-moderate.  2022-12-19 note mentions wife's death 2019/11/04 from heart attack, was going to dialysis following bad infection, about 4 wks at that point.  At that time, living alone, remaining in shared home, with some organizational support (adult children prepare medication box).  EHR notes several chronic conditions including diabetes, OSA, lower edema, obesity, kidney disease, and hx of 2 CVAs and pulmonary emboli 6 and 8 years ago with remaining dysarthria.  Psychiatric history noted for relapses due to medication noncompliance, but currently stable with med support and regimen of Abilify, Zoloft, and Vitamin D.  Concern noted for possible placement now that wife and primary caregiver is gone.  Last year episodic stress with feeling like a burden to people, selling a beach home, hosting sister after her kids kicked her out, and wife readying to retire.  Pt reports feeling no reason to live.  Together with Mickel Baas 41 years.  Still living alone, kids check daily.  Will be having a caregiver a couple hours.  Mickel Baas used to drive, push chair, set up medicine, take care of him.  Tends to feel like a burden now.  May 19 the anniversary of both meeting on a blind date and marrying the next year.  No concerns about not knowing how each other  felt.  Long tendency to worry.  Tends to worry about the kids, nothing at present but feeling like a burden.  Hard to get to sleep at night for taking naps daytime to avoid feeling, thinking.  Hx of depression causing same.  Triggers then were feeling like people were against him.  Disabled by 2nd stroke.  Finances stable, house paid off now.  Daughter handles other bills.  Kids go to grocery.  Signed up for Meals on Wheels, which tastes good to him, able to operate microwave for himself.  Able to transfer by self but needs assistance with showers and toileting, and cannot accommodate bedside commode.  Currently gets home health nursing, sponge bath 2/wk.  House is 2 floors, with modified downstairs shower.  COVID vaccination on Saturday.  Prior Psychiatric Assessment/Treatment:   Outpatient treatment: psychiatry -- Dr. Clovis Pu Psychiatric hospitalization: none stated Psychological assessment/testing: none stated   Abuse/neglect screening: Victim of abuse: Not assessed at this time / none suspected.   Victim of neglect: Not assessed at this time / none suspected.   Perpetrator of abuse/neglect: Not assessed at this time / none suspected.   Witness / Exposure to Domestic Violence: Not assessed at this time / none suspected.   Witness to Community Violence:  Not assessed at this time / none suspected.   Protective Services Involvement: Not assessed at this time / none suspected.   Report needed: No.    Substance abuse screening: Current substance abuse: Not assessed at this time / none suspected.   History of impactful substance use/abuse: Not assessed at this time /  none suspected.     FAMILY/SOCIAL HISTORY Family of origin -- Deferred.   Sister doesn't stay with him at present.  She has dementia, is in a memory care facility on [address], just moved there 2 weeks ago.   Family of intention/current living situation -- Lives alone, widowed last year Mickel Baas), supportive services as noted above.   Son Corene Cornea and daughter Vicente Males, both in [place], sufficiently reachable and self-scheduling to look in daily.  Education -- deferred Vocation -- deferred Finances -- deferred [place], practicing Enjoyable activities -- crafting Other situational factors affecting treatment and prognosis:  Stressors from the following areas: Health problems Barriers to service: transportation-dependent  Notable cultural sensitivities: not applicable  Strengths: Family and Church   MED/SURG HISTORY Med/surg history was not reviewed with PT at this time.  Of note for psychotherapy at this time stroke effects impairing speech and multiple care-dependent conditions. Past Medical History:  Diagnosis Date  . 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 Back spasm   . Chronic kidney disease (CKD), stage III (moderate)   . Depression with anxiety   . Diabetes mellitus   . Dysarthria   . Dyslipidemia   . Hypertension   . Obesity   . OSA on CPAP   . Pulmonary emboli ([place]) 02/2014  . Stasis edema of both lower extremities   . Stroke [facility]) July 2013   left paramedian pontine, incidental right parietal subcortical. both d/t small vessel disease  . Stroke [facility]) March 2015   left basal ganglia secondary to small vessel disease     Past Surgical History:  Procedure Laterality Date  . ANAL FISSURE REPAIR    . IR KYPHO EA ADDL LEVEL THORACIC OR LUMBAR  04/16/2018  . IR KYPHO LUMBAR INC FX REDUCE BONE BX UNI/BIL CANNULATION INC/IMAGING  04/16/2018  . IR RADIOLOGIST EVAL & MGMT  05/04/2018  . TONSILLECTOMY      Allergies  Allergen Reactions  . Penicillins Hives and Other (See Comments)    Has patient had a PCN reaction causing immediate rash, facial/tongue/throat swelling, SOB or lightheadedness with hypotension: no Has patient had a PCN reaction causing severe rash involving mucus membranes or skin necrosis: no Has patient had a PCN reaction that required  hospitalization: no Has patient had a PCN reaction occurring within the last 10 years: no If all of the above answers are "NO", then may proceed with Cephalosporin use.     Medications (as listed in Epic): Current Outpatient Medications  Medication Sig Dispense Refill  . acetaminophen (TYLENOL) 325 MG tablet Take 2 tablets (650 mg total) by mouth every 6 (six) hours as needed for mild pain (or Fever >/= 101).    Marland Kitchen amLODipine (NORVASC) 10 MG tablet Take 1 tablet (10 mg total) by mouth daily. 30 tablet 0  . ARIPiprazole (ABILIFY) 5 MG tablet Take 1 tablet (5 mg total) by mouth daily. 90 tablet 0  . baclofen (LIORESAL) 20 MG tablet Take 1 tablet (20 mg total) by mouth 3 (three) times daily as needed for muscle spasms. 30 each 0  . Blood Glucose Monitoring Suppl (ONE TOUCH ULTRA 2) W/DEVICE KIT     . docusate sodium (COLACE) 100 MG capsule Take 1 capsule (100 mg total) by mouth 3 (three) times a week. M-W-F 12 capsule 0  . ELIQUIS 2.5 MG TABS tablet     . glucose blood (ONE TOUCH ULTRA TEST) test strip Use each time you check your blood sugar. 100 each  0  . hydrALAZINE (APRESOLINE) 25 MG tablet Take 1 tablet (25 mg total) by mouth 2 (two) times daily. 60 tablet 0  . isosorbide mononitrate (IMDUR) 30 MG 24 hr tablet Take 1 tablet (30 mg total) by mouth daily. 30 tablet 0  . lidocaine (LIDODERM) 5 % Place 1 patch onto the skin daily. Remove & Discard patch within 12 hours or as directed by MD 30 patch 0  . Menthol, Topical Analgesic, (BIOFREEZE) 4 % GEL Apply 1 application topically 2 (two) times daily as needed (Pain). Apply to joints 89 mL 0  . nystatin (MYCOSTATIN/NYSTOP) powder Apply topically 2 (two) times daily. 15 g 0  . olmesartan (BENICAR) 5 MG tablet Take 1 tablet (5 mg total) by mouth daily. 30 tablet 0  . ondansetron (ZOFRAN) 4 MG tablet Take 1 tablet (4 mg total) by mouth every 6 (six) hours as needed for nausea or vomiting. 30 tablet 0  . ONETOUCH DELICA LANCETS 00P MISC 1 each by  Does not apply route daily as needed. 100 each 0  . pioglitazone (ACTOS) 15 MG tablet Take 0.5 tablets (7.5 mg total) by mouth daily. 15 tablet 0  . potassium chloride SA (K-DUR,KLOR-CON) 20 MEQ tablet Take 1 tablet (20 mEq total) by mouth daily. 30 tablet 0  . sertraline (ZOLOFT) 100 MG tablet TAKE TWO TABLETS BY MOUTH DAILY 180 tablet 2  . simvastatin (ZOCOR) 20 MG tablet Take 1 tablet (20 mg total) by mouth daily at 6 PM. 30 tablet 0  . traMADol (ULTRAM) 50 MG tablet Take 1 tablet (50 mg total) by mouth 2 (two) times daily as needed for moderate pain. 30 tablet 0  . Vitamin D, Ergocalciferol, (DRISDOL) 50000 UNITS CAPS capsule Take 50,000 Units by mouth every Friday.      No current facility-administered medications for this visit.    MENTAL STATUS AND OBSERVATIONS Appearance:   Casual and Neat     Behavior:  Appropriate  Motor:  wheelchair  Speech/Language:   dysarthric  Affect:  Appropriate  Mood:  sad  Thought process:  normal  Thought content:    WNL  Sensory/Perceptual disturbances:    WNL  Orientation:  Fully oriented  Attention:  Good  Concentration:  Good  Memory:  WNL  Fund of knowledge:   Fair  Insight:    Good  Judgment:   Good  Impulse Control:  Good   Initial Risk Assessment: Danger to self: No Self-injurious behavior: No Danger to others: No Physical aggression / violence: No Duty to warn: No Access to firearms a concern: No Gang involvement: No Patient / guardian was educated about steps to take if suicide or homicide risk level increases between visits: yes . While future psychiatric events cannot be accurately predicted, the patient does not currently require acute inpatient psychiatric care and does not currently meet [facility] involuntary commitment criteria.   DIAGNOSIS:    ICD-10-CM   1. Major depressive disorder, recurrent episode, moderate (HCC)  F33.1   2. Bereavement reaction  F43.20    Z63.4     INITIAL TREATMENT: . Support/validation  provided for distressing symptoms and confirmed rapport . Ethical orientation and informed consent confirmed re: o privacy rights -- including but not limited to HIPAA, EMR and use of e-PHI o patient responsibilities -- scheduling, fair notice of changes, in-person vs. telehealth and regulatory and financial conditions affecting choice o expectations for working relationship in psychotherapy o needs and consents for working partnerships and exchange of information with  other health care providers, especially any medication and other behavioral health providers . Initial orientation to cognitive-behavioral and solution-focused therapy approach . Hopes for therapy -- "better outlook on life".  Asked what if miracle occurred how he'd notice, would be crafting somehow.  History of flower arranging, cross stitch, collecting rubber stamps.  Had been enjoying some work together with Mickel Baas on [employer].  Also covering hangers with yard winding.  Discussed possibilities for crafting and uses. . Outlook for therapy -- scheduling constraints, availability of crisis service, inclusion of family member(s) as appropriate  Plan: . Initial homework to identify crafting activities he would like to spend time with and any  . Likely to engage typical grief therapy options like journaling, letter-writing, picture collection . Continue making arrangements for home health . Maintain medication as prescribed and work faithfully with relevant prescriber(s) if any changes are desired or seem indicated . Call the clinic on-call service, present to ER, or call 911 if any life-threatening psychiatric crisis Return in about 1 month (around 03/24/2020) for recommend scheduling ahead.  Blanchie Serve, PhD  Luan Moore, PhD LP Clinical Psychologist, [facility] [address], [employer] [place], [place] [address] [phone]

## 2020-02-24 DIAGNOSIS — L89312 Pressure ulcer of right buttock, stage 2: Secondary | ICD-10-CM | POA: Diagnosis not present

## 2020-02-24 DIAGNOSIS — E1122 Type 2 diabetes mellitus with diabetic chronic kidney disease: Secondary | ICD-10-CM | POA: Diagnosis not present

## 2020-02-24 DIAGNOSIS — G4733 Obstructive sleep apnea (adult) (pediatric): Secondary | ICD-10-CM | POA: Diagnosis not present

## 2020-02-24 DIAGNOSIS — I69151 Hemiplegia and hemiparesis following nontraumatic intracerebral hemorrhage affecting right dominant side: Secondary | ICD-10-CM | POA: Diagnosis not present

## 2020-02-24 DIAGNOSIS — I10 Essential (primary) hypertension: Secondary | ICD-10-CM | POA: Diagnosis not present

## 2020-02-24 DIAGNOSIS — F329 Major depressive disorder, single episode, unspecified: Secondary | ICD-10-CM | POA: Diagnosis not present

## 2020-02-24 DIAGNOSIS — E785 Hyperlipidemia, unspecified: Secondary | ICD-10-CM | POA: Diagnosis not present

## 2020-02-24 DIAGNOSIS — I69122 Dysarthria following nontraumatic intracerebral hemorrhage: Secondary | ICD-10-CM | POA: Diagnosis not present

## 2020-02-24 DIAGNOSIS — F3341 Major depressive disorder, recurrent, in partial remission: Secondary | ICD-10-CM | POA: Diagnosis not present

## 2020-02-24 DIAGNOSIS — N183 Chronic kidney disease, stage 3 unspecified: Secondary | ICD-10-CM | POA: Diagnosis not present

## 2020-02-24 DIAGNOSIS — E1169 Type 2 diabetes mellitus with other specified complication: Secondary | ICD-10-CM | POA: Diagnosis not present

## 2020-02-24 DIAGNOSIS — L89322 Pressure ulcer of left buttock, stage 2: Secondary | ICD-10-CM | POA: Diagnosis not present

## 2020-02-25 ENCOUNTER — Ambulatory Visit: Payer: Medicare HMO

## 2020-02-28 DIAGNOSIS — I10 Essential (primary) hypertension: Secondary | ICD-10-CM | POA: Diagnosis not present

## 2020-02-28 DIAGNOSIS — I69122 Dysarthria following nontraumatic intracerebral hemorrhage: Secondary | ICD-10-CM | POA: Diagnosis not present

## 2020-02-28 DIAGNOSIS — L89322 Pressure ulcer of left buttock, stage 2: Secondary | ICD-10-CM | POA: Diagnosis not present

## 2020-02-28 DIAGNOSIS — I69151 Hemiplegia and hemiparesis following nontraumatic intracerebral hemorrhage affecting right dominant side: Secondary | ICD-10-CM | POA: Diagnosis not present

## 2020-02-28 DIAGNOSIS — G4733 Obstructive sleep apnea (adult) (pediatric): Secondary | ICD-10-CM | POA: Diagnosis not present

## 2020-02-28 DIAGNOSIS — E1122 Type 2 diabetes mellitus with diabetic chronic kidney disease: Secondary | ICD-10-CM | POA: Diagnosis not present

## 2020-02-28 DIAGNOSIS — N183 Chronic kidney disease, stage 3 unspecified: Secondary | ICD-10-CM | POA: Diagnosis not present

## 2020-02-28 DIAGNOSIS — L89312 Pressure ulcer of right buttock, stage 2: Secondary | ICD-10-CM | POA: Diagnosis not present

## 2020-02-28 DIAGNOSIS — F329 Major depressive disorder, single episode, unspecified: Secondary | ICD-10-CM | POA: Diagnosis not present

## 2020-02-29 ENCOUNTER — Ambulatory Visit: Payer: Medicare HMO | Admitting: Psychiatry

## 2020-02-29 DIAGNOSIS — N183 Chronic kidney disease, stage 3 unspecified: Secondary | ICD-10-CM | POA: Diagnosis not present

## 2020-02-29 DIAGNOSIS — I69151 Hemiplegia and hemiparesis following nontraumatic intracerebral hemorrhage affecting right dominant side: Secondary | ICD-10-CM | POA: Diagnosis not present

## 2020-02-29 DIAGNOSIS — F329 Major depressive disorder, single episode, unspecified: Secondary | ICD-10-CM | POA: Diagnosis not present

## 2020-02-29 DIAGNOSIS — L89322 Pressure ulcer of left buttock, stage 2: Secondary | ICD-10-CM | POA: Diagnosis not present

## 2020-02-29 DIAGNOSIS — I10 Essential (primary) hypertension: Secondary | ICD-10-CM | POA: Diagnosis not present

## 2020-02-29 DIAGNOSIS — I69122 Dysarthria following nontraumatic intracerebral hemorrhage: Secondary | ICD-10-CM | POA: Diagnosis not present

## 2020-02-29 DIAGNOSIS — L89312 Pressure ulcer of right buttock, stage 2: Secondary | ICD-10-CM | POA: Diagnosis not present

## 2020-02-29 DIAGNOSIS — G4733 Obstructive sleep apnea (adult) (pediatric): Secondary | ICD-10-CM | POA: Diagnosis not present

## 2020-02-29 DIAGNOSIS — E1122 Type 2 diabetes mellitus with diabetic chronic kidney disease: Secondary | ICD-10-CM | POA: Diagnosis not present

## 2020-03-02 DIAGNOSIS — G4733 Obstructive sleep apnea (adult) (pediatric): Secondary | ICD-10-CM | POA: Diagnosis not present

## 2020-03-02 DIAGNOSIS — L89322 Pressure ulcer of left buttock, stage 2: Secondary | ICD-10-CM | POA: Diagnosis not present

## 2020-03-02 DIAGNOSIS — F329 Major depressive disorder, single episode, unspecified: Secondary | ICD-10-CM | POA: Diagnosis not present

## 2020-03-02 DIAGNOSIS — L89312 Pressure ulcer of right buttock, stage 2: Secondary | ICD-10-CM | POA: Diagnosis not present

## 2020-03-02 DIAGNOSIS — I69151 Hemiplegia and hemiparesis following nontraumatic intracerebral hemorrhage affecting right dominant side: Secondary | ICD-10-CM | POA: Diagnosis not present

## 2020-03-02 DIAGNOSIS — I10 Essential (primary) hypertension: Secondary | ICD-10-CM | POA: Diagnosis not present

## 2020-03-02 DIAGNOSIS — I69122 Dysarthria following nontraumatic intracerebral hemorrhage: Secondary | ICD-10-CM | POA: Diagnosis not present

## 2020-03-02 DIAGNOSIS — N183 Chronic kidney disease, stage 3 unspecified: Secondary | ICD-10-CM | POA: Diagnosis not present

## 2020-03-02 DIAGNOSIS — E1122 Type 2 diabetes mellitus with diabetic chronic kidney disease: Secondary | ICD-10-CM | POA: Diagnosis not present

## 2020-03-06 DIAGNOSIS — I69122 Dysarthria following nontraumatic intracerebral hemorrhage: Secondary | ICD-10-CM | POA: Diagnosis not present

## 2020-03-06 DIAGNOSIS — I10 Essential (primary) hypertension: Secondary | ICD-10-CM | POA: Diagnosis not present

## 2020-03-06 DIAGNOSIS — F329 Major depressive disorder, single episode, unspecified: Secondary | ICD-10-CM | POA: Diagnosis not present

## 2020-03-06 DIAGNOSIS — I69151 Hemiplegia and hemiparesis following nontraumatic intracerebral hemorrhage affecting right dominant side: Secondary | ICD-10-CM | POA: Diagnosis not present

## 2020-03-06 DIAGNOSIS — E1122 Type 2 diabetes mellitus with diabetic chronic kidney disease: Secondary | ICD-10-CM | POA: Diagnosis not present

## 2020-03-06 DIAGNOSIS — L89312 Pressure ulcer of right buttock, stage 2: Secondary | ICD-10-CM | POA: Diagnosis not present

## 2020-03-06 DIAGNOSIS — N183 Chronic kidney disease, stage 3 unspecified: Secondary | ICD-10-CM | POA: Diagnosis not present

## 2020-03-06 DIAGNOSIS — L89322 Pressure ulcer of left buttock, stage 2: Secondary | ICD-10-CM | POA: Diagnosis not present

## 2020-03-06 DIAGNOSIS — G4733 Obstructive sleep apnea (adult) (pediatric): Secondary | ICD-10-CM | POA: Diagnosis not present

## 2020-03-09 DIAGNOSIS — I69122 Dysarthria following nontraumatic intracerebral hemorrhage: Secondary | ICD-10-CM | POA: Diagnosis not present

## 2020-03-09 DIAGNOSIS — F329 Major depressive disorder, single episode, unspecified: Secondary | ICD-10-CM | POA: Diagnosis not present

## 2020-03-09 DIAGNOSIS — I10 Essential (primary) hypertension: Secondary | ICD-10-CM | POA: Diagnosis not present

## 2020-03-09 DIAGNOSIS — E1122 Type 2 diabetes mellitus with diabetic chronic kidney disease: Secondary | ICD-10-CM | POA: Diagnosis not present

## 2020-03-09 DIAGNOSIS — N183 Chronic kidney disease, stage 3 unspecified: Secondary | ICD-10-CM | POA: Diagnosis not present

## 2020-03-09 DIAGNOSIS — G4733 Obstructive sleep apnea (adult) (pediatric): Secondary | ICD-10-CM | POA: Diagnosis not present

## 2020-03-09 DIAGNOSIS — L89312 Pressure ulcer of right buttock, stage 2: Secondary | ICD-10-CM | POA: Diagnosis not present

## 2020-03-09 DIAGNOSIS — L89322 Pressure ulcer of left buttock, stage 2: Secondary | ICD-10-CM | POA: Diagnosis not present

## 2020-03-09 DIAGNOSIS — I69151 Hemiplegia and hemiparesis following nontraumatic intracerebral hemorrhage affecting right dominant side: Secondary | ICD-10-CM | POA: Diagnosis not present

## 2020-03-13 DIAGNOSIS — I69122 Dysarthria following nontraumatic intracerebral hemorrhage: Secondary | ICD-10-CM | POA: Diagnosis not present

## 2020-03-13 DIAGNOSIS — F329 Major depressive disorder, single episode, unspecified: Secondary | ICD-10-CM | POA: Diagnosis not present

## 2020-03-13 DIAGNOSIS — N183 Chronic kidney disease, stage 3 unspecified: Secondary | ICD-10-CM | POA: Diagnosis not present

## 2020-03-13 DIAGNOSIS — I10 Essential (primary) hypertension: Secondary | ICD-10-CM | POA: Diagnosis not present

## 2020-03-13 DIAGNOSIS — E1122 Type 2 diabetes mellitus with diabetic chronic kidney disease: Secondary | ICD-10-CM | POA: Diagnosis not present

## 2020-03-13 DIAGNOSIS — G4733 Obstructive sleep apnea (adult) (pediatric): Secondary | ICD-10-CM | POA: Diagnosis not present

## 2020-03-13 DIAGNOSIS — L89322 Pressure ulcer of left buttock, stage 2: Secondary | ICD-10-CM | POA: Diagnosis not present

## 2020-03-13 DIAGNOSIS — L89312 Pressure ulcer of right buttock, stage 2: Secondary | ICD-10-CM | POA: Diagnosis not present

## 2020-03-13 DIAGNOSIS — I69151 Hemiplegia and hemiparesis following nontraumatic intracerebral hemorrhage affecting right dominant side: Secondary | ICD-10-CM | POA: Diagnosis not present

## 2020-03-16 DIAGNOSIS — F329 Major depressive disorder, single episode, unspecified: Secondary | ICD-10-CM | POA: Diagnosis not present

## 2020-03-16 DIAGNOSIS — I69122 Dysarthria following nontraumatic intracerebral hemorrhage: Secondary | ICD-10-CM | POA: Diagnosis not present

## 2020-03-16 DIAGNOSIS — L89322 Pressure ulcer of left buttock, stage 2: Secondary | ICD-10-CM | POA: Diagnosis not present

## 2020-03-16 DIAGNOSIS — I10 Essential (primary) hypertension: Secondary | ICD-10-CM | POA: Diagnosis not present

## 2020-03-16 DIAGNOSIS — G4733 Obstructive sleep apnea (adult) (pediatric): Secondary | ICD-10-CM | POA: Diagnosis not present

## 2020-03-16 DIAGNOSIS — E1122 Type 2 diabetes mellitus with diabetic chronic kidney disease: Secondary | ICD-10-CM | POA: Diagnosis not present

## 2020-03-16 DIAGNOSIS — N183 Chronic kidney disease, stage 3 unspecified: Secondary | ICD-10-CM | POA: Diagnosis not present

## 2020-03-16 DIAGNOSIS — I69151 Hemiplegia and hemiparesis following nontraumatic intracerebral hemorrhage affecting right dominant side: Secondary | ICD-10-CM | POA: Diagnosis not present

## 2020-03-16 DIAGNOSIS — L89312 Pressure ulcer of right buttock, stage 2: Secondary | ICD-10-CM | POA: Diagnosis not present

## 2020-03-20 ENCOUNTER — Ambulatory Visit (INDEPENDENT_AMBULATORY_CARE_PROVIDER_SITE_OTHER): Payer: Medicare HMO | Admitting: Psychiatry

## 2020-03-20 DIAGNOSIS — F329 Major depressive disorder, single episode, unspecified: Secondary | ICD-10-CM | POA: Diagnosis not present

## 2020-03-20 DIAGNOSIS — E1122 Type 2 diabetes mellitus with diabetic chronic kidney disease: Secondary | ICD-10-CM | POA: Diagnosis not present

## 2020-03-20 DIAGNOSIS — F331 Major depressive disorder, recurrent, moderate: Secondary | ICD-10-CM | POA: Diagnosis not present

## 2020-03-20 DIAGNOSIS — I10 Essential (primary) hypertension: Secondary | ICD-10-CM | POA: Diagnosis not present

## 2020-03-20 DIAGNOSIS — N183 Chronic kidney disease, stage 3 unspecified: Secondary | ICD-10-CM | POA: Diagnosis not present

## 2020-03-20 DIAGNOSIS — I69151 Hemiplegia and hemiparesis following nontraumatic intracerebral hemorrhage affecting right dominant side: Secondary | ICD-10-CM | POA: Diagnosis not present

## 2020-03-20 DIAGNOSIS — I69122 Dysarthria following nontraumatic intracerebral hemorrhage: Secondary | ICD-10-CM | POA: Diagnosis not present

## 2020-03-20 DIAGNOSIS — F4321 Adjustment disorder with depressed mood: Secondary | ICD-10-CM | POA: Diagnosis not present

## 2020-03-20 DIAGNOSIS — G4733 Obstructive sleep apnea (adult) (pediatric): Secondary | ICD-10-CM | POA: Diagnosis not present

## 2020-03-20 DIAGNOSIS — L89312 Pressure ulcer of right buttock, stage 2: Secondary | ICD-10-CM | POA: Diagnosis not present

## 2020-03-20 DIAGNOSIS — L89322 Pressure ulcer of left buttock, stage 2: Secondary | ICD-10-CM | POA: Diagnosis not present

## 2020-03-20 NOTE — Progress Notes (Signed)
Psychotherapy Progress Note [facility] Luan Moore, PhD LP  Patient ID: Dakota Hall     MRN: [id number] Therapy format: Individual psychotherapy Date: 03/20/2020      Start: 4:24p     Stop: 5:10p     Time Spent: 46 min Location: Telehealth visit -- I connected with this patient by an approved telecommunication method (audio only), with his informed consent, and verifying identity and patient privacy.  I was located at my office and patient at his home.  As needed, we discussed the limitations, risks, and security and privacy concerns associated with telehealth service, including the availability and conditions which currently govern in-person appointments and the possibility that 3rd-party payment may not be fully guaranteed and he may be responsible for charges.  After he indicated understanding, we proceeded with the session.  Also discussed treatment planning, as needed, including ongoing verbal agreement with the plan, the opportunity to ask and answer all questions, his demonstrated understanding of instructions, and his readiness to call the office should symptoms worsen or he feels he is in a crisis state and needs more immediate and tangible assistance.   Session narrative (presenting needs, interim history, self-report of stressors and symptoms, applications of prior therapy, status changes, and interventions made in session) Set up as phone call -- daughter was transportation and had a meeting.  Things are going better -- has a sitter 3 days a week doing meals and light duty.  Still has moments missing wife, times when he feels like he must be a bother to his kids.  They say no, but sometimes hard to believe.  Unavailable over the weekend, though, which was exceptionally lonely.    Offered referral to [employer], but unable to write down due to stroke effects.  Does have 1/month church visitation.  Discussed possibility of asking for increased visits.    Re.  grieving, discussed possibilities of journaling, reminiscing, and letter-writing.  Is doing some of going through pictures, taking time to look and think about memories, and has displayed some.  Has two cats, talks to them all the time, one seems to follow commands.  One will allow petting, sit in lap, the other will be in touch with anyone but [place].  Spends time watching TV, thinking about Mickel Baas, going to the front door and looking out, staring.  41 years is a lot to part with.  Led in imaginary conversation with Mickel Baas, in the living room now, imagined smiling, free to speak and able to listen.  Addressed wanting to hold her by holding her blanket.  Encouraged and normalized talking out loud to her, hearing her side of the conversation, using the blanket as desired.  Inquired what she would say about him if present, really just that he is kind, thoughtful, a bit set in his ways, but nothing she would change, just worried how he worries.  She used to be able to get his mind off worries.  Therapeutic modalities: Cognitive Behavioral Therapy, Solution-Oriented/Positive Psychology, Ego-Supportive and Grief Therapy  Mental Status/Observations:  Appearance:   Not assessed     Behavior:  Appropriate  Motor:  Not assessed  Speech/Language:   Slurred and post-stroke effects  Affect:  Not assessed  Mood:  sad  Thought process:  normal  Thought content:    WNL  Sensory/Perceptual disturbances:    WNL  Orientation:  Fully oriented  Attention:  Good    Concentration:  Good  Memory:  WNL  Insight:  Good  Judgment:   Good  Impulse Control:  Good   Risk Assessment: Danger to Self: No Self-injurious Behavior: No Danger to Others: No Physical Aggression / Violence: No Duty to Warn: No Access to Firearms a concern: No  Assessment of progress:  progressing  Diagnosis:   ICD-10-CM   1. Major depressive disorder, recurrent episode, moderate (HCC)  F33.1   2. Grief  F43.21    Plan:  . Resolved to  talk to [name] in spirit, have the conversations anyway that they would have alive, at his discretion . Encouraged to ask church and/or [employer] for more available support . Other recommendations/advice as may be noted above . Continue to utilize previously learned skills ad lib . Maintain medication as prescribed and work faithfully with relevant prescriber(s) if any changes are desired or seem indicated . Call the clinic on-call service, present to ER, or call 911 if any life-threatening psychiatric crisis Return in about 1 month (around 04/20/2020) for time as available, will call. . Already scheduled visit in this office Visit date not found.  Robley Fries, PhD Marliss Czar, PhD LP Clinical Psychologist, [facility] [address] [place], [place] [address] [phone]

## 2020-03-20 NOTE — Addendum Note (Signed)
Addended by: Robley Fries A on: 03/20/2020 10:45 PM   Modules accepted: Level of Service

## 2020-03-23 DIAGNOSIS — F329 Major depressive disorder, single episode, unspecified: Secondary | ICD-10-CM | POA: Diagnosis not present

## 2020-03-23 DIAGNOSIS — G4733 Obstructive sleep apnea (adult) (pediatric): Secondary | ICD-10-CM | POA: Diagnosis not present

## 2020-03-23 DIAGNOSIS — L89322 Pressure ulcer of left buttock, stage 2: Secondary | ICD-10-CM | POA: Diagnosis not present

## 2020-03-23 DIAGNOSIS — E1122 Type 2 diabetes mellitus with diabetic chronic kidney disease: Secondary | ICD-10-CM | POA: Diagnosis not present

## 2020-03-23 DIAGNOSIS — I69151 Hemiplegia and hemiparesis following nontraumatic intracerebral hemorrhage affecting right dominant side: Secondary | ICD-10-CM | POA: Diagnosis not present

## 2020-03-23 DIAGNOSIS — F3341 Major depressive disorder, recurrent, in partial remission: Secondary | ICD-10-CM | POA: Diagnosis not present

## 2020-03-23 DIAGNOSIS — E1169 Type 2 diabetes mellitus with other specified complication: Secondary | ICD-10-CM | POA: Diagnosis not present

## 2020-03-23 DIAGNOSIS — E785 Hyperlipidemia, unspecified: Secondary | ICD-10-CM | POA: Diagnosis not present

## 2020-03-23 DIAGNOSIS — N183 Chronic kidney disease, stage 3 unspecified: Secondary | ICD-10-CM | POA: Diagnosis not present

## 2020-03-23 DIAGNOSIS — L89312 Pressure ulcer of right buttock, stage 2: Secondary | ICD-10-CM | POA: Diagnosis not present

## 2020-03-23 DIAGNOSIS — I10 Essential (primary) hypertension: Secondary | ICD-10-CM | POA: Diagnosis not present

## 2020-03-23 DIAGNOSIS — I69122 Dysarthria following nontraumatic intracerebral hemorrhage: Secondary | ICD-10-CM | POA: Diagnosis not present

## 2020-03-27 DIAGNOSIS — L89322 Pressure ulcer of left buttock, stage 2: Secondary | ICD-10-CM | POA: Diagnosis not present

## 2020-03-27 DIAGNOSIS — I69122 Dysarthria following nontraumatic intracerebral hemorrhage: Secondary | ICD-10-CM | POA: Diagnosis not present

## 2020-03-27 DIAGNOSIS — N183 Chronic kidney disease, stage 3 unspecified: Secondary | ICD-10-CM | POA: Diagnosis not present

## 2020-03-27 DIAGNOSIS — I10 Essential (primary) hypertension: Secondary | ICD-10-CM | POA: Diagnosis not present

## 2020-03-27 DIAGNOSIS — I69151 Hemiplegia and hemiparesis following nontraumatic intracerebral hemorrhage affecting right dominant side: Secondary | ICD-10-CM | POA: Diagnosis not present

## 2020-03-27 DIAGNOSIS — E1122 Type 2 diabetes mellitus with diabetic chronic kidney disease: Secondary | ICD-10-CM | POA: Diagnosis not present

## 2020-03-27 DIAGNOSIS — L89312 Pressure ulcer of right buttock, stage 2: Secondary | ICD-10-CM | POA: Diagnosis not present

## 2020-03-27 DIAGNOSIS — F329 Major depressive disorder, single episode, unspecified: Secondary | ICD-10-CM | POA: Diagnosis not present

## 2020-03-27 DIAGNOSIS — G4733 Obstructive sleep apnea (adult) (pediatric): Secondary | ICD-10-CM | POA: Diagnosis not present

## 2020-03-30 DIAGNOSIS — I69151 Hemiplegia and hemiparesis following nontraumatic intracerebral hemorrhage affecting right dominant side: Secondary | ICD-10-CM | POA: Diagnosis not present

## 2020-03-30 DIAGNOSIS — F329 Major depressive disorder, single episode, unspecified: Secondary | ICD-10-CM | POA: Diagnosis not present

## 2020-03-30 DIAGNOSIS — E1122 Type 2 diabetes mellitus with diabetic chronic kidney disease: Secondary | ICD-10-CM | POA: Diagnosis not present

## 2020-03-30 DIAGNOSIS — G4733 Obstructive sleep apnea (adult) (pediatric): Secondary | ICD-10-CM | POA: Diagnosis not present

## 2020-03-30 DIAGNOSIS — I69122 Dysarthria following nontraumatic intracerebral hemorrhage: Secondary | ICD-10-CM | POA: Diagnosis not present

## 2020-03-30 DIAGNOSIS — N183 Chronic kidney disease, stage 3 unspecified: Secondary | ICD-10-CM | POA: Diagnosis not present

## 2020-03-30 DIAGNOSIS — I10 Essential (primary) hypertension: Secondary | ICD-10-CM | POA: Diagnosis not present

## 2020-03-30 DIAGNOSIS — L89322 Pressure ulcer of left buttock, stage 2: Secondary | ICD-10-CM | POA: Diagnosis not present

## 2020-03-30 DIAGNOSIS — L89312 Pressure ulcer of right buttock, stage 2: Secondary | ICD-10-CM | POA: Diagnosis not present

## 2020-04-03 DIAGNOSIS — E1122 Type 2 diabetes mellitus with diabetic chronic kidney disease: Secondary | ICD-10-CM | POA: Diagnosis not present

## 2020-04-03 DIAGNOSIS — F329 Major depressive disorder, single episode, unspecified: Secondary | ICD-10-CM | POA: Diagnosis not present

## 2020-04-03 DIAGNOSIS — G4733 Obstructive sleep apnea (adult) (pediatric): Secondary | ICD-10-CM | POA: Diagnosis not present

## 2020-04-03 DIAGNOSIS — L89312 Pressure ulcer of right buttock, stage 2: Secondary | ICD-10-CM | POA: Diagnosis not present

## 2020-04-03 DIAGNOSIS — N183 Chronic kidney disease, stage 3 unspecified: Secondary | ICD-10-CM | POA: Diagnosis not present

## 2020-04-03 DIAGNOSIS — I69151 Hemiplegia and hemiparesis following nontraumatic intracerebral hemorrhage affecting right dominant side: Secondary | ICD-10-CM | POA: Diagnosis not present

## 2020-04-03 DIAGNOSIS — I69122 Dysarthria following nontraumatic intracerebral hemorrhage: Secondary | ICD-10-CM | POA: Diagnosis not present

## 2020-04-03 DIAGNOSIS — L89322 Pressure ulcer of left buttock, stage 2: Secondary | ICD-10-CM | POA: Diagnosis not present

## 2020-04-03 DIAGNOSIS — I10 Essential (primary) hypertension: Secondary | ICD-10-CM | POA: Diagnosis not present

## 2020-04-04 ENCOUNTER — Other Ambulatory Visit: Payer: Self-pay

## 2020-04-04 NOTE — Patient Outreach (Signed)
[facility]) Care Management  04/04/2020  HENRRY FEIL August 17, 1951 [id number]   Referral Date: 04/03/20 Referral Source: MD Referral Referral Reason: Hemiplegia, decreased mobility, anticoagulation.    Outreach Attempt: Telephone call to daughter [name] per referral request.  No answer.  HIPAA compliant voice message left.     Plan: RN CM will attempt again within 4 business days and send letter.    Bary Leriche, RN, MSN [facility] Care Management Care Management Coordinator Direct Line [phone] Toll Free: [phone]  Fax: [phone]

## 2020-04-05 ENCOUNTER — Other Ambulatory Visit: Payer: Self-pay

## 2020-04-05 NOTE — Patient Outreach (Signed)
[facility]) Care Management  04/05/2020  Dakota Hall 03-03-1951 [id number]   Referral Date: 04/03/20 Referral Source: MD Referral Referral Reason: Hemiplegia, decreased mobility, anticoagulation.    Outreach Attempt: Telephone call to daughter [name] per referral request.  No answer.  HIPAA compliant voice message left.  Telephone call to son [name].  He states he cannot talk right now.  Son to call CM back later this afternoon.     Plan: RN CM will wait return call.  If no return call will attempt again within 4 business days.  Bary Leriche, RN, MSN [facility] Care Management Care Management Coordinator Direct Line [phone] Cell [phone] Toll Free: [phone]  Fax: [phone]

## 2020-04-06 ENCOUNTER — Other Ambulatory Visit: Payer: Self-pay

## 2020-04-06 NOTE — Patient Outreach (Signed)
[facility]) Care Management  04/06/2020  THERRON SELLS 12-06-1950 [id number]   Referral Date: 04/03/20 Referral Source: MD Referral Referral Reason: Hemiplegia, decreased mobility, anticoagulation.    Outreach Attempt: Telephone call to son [name].  No answer.  HIPAA compliant voice message left.    Plan: RN CM will wait return call.  If no return call will plan to attempt again in the month of June.    Bary Leriche, RN, MSN [facility] Care Management Care Management Coordinator Direct Line [phone] Cell [phone] Toll Free: [phone]  Fax: [phone]

## 2020-04-17 ENCOUNTER — Other Ambulatory Visit: Payer: Self-pay

## 2020-04-17 NOTE — Patient Outreach (Signed)
[facility]) Care Management  04/17/2020  AARRON WIERZBICKI October 14, 1951 [id number]   Referral Date:04/03/20 Referral Source:MD Referral Referral Reason:Hemiplegia,decreased mobility, anticoagulation.   Outreach Attempt:Return telephone call to daughter [name]. No answer. HIPAA compliant voice message left.   Plan: RN CM will wait return call.  If no return call will attempt again in the month of June.  Bary Leriche, RN, MSN [facility] Care Management Care Management Coordinator Direct Line [phone] Cell [phone] Toll Free: [phone]  Fax: [phone]

## 2020-04-17 NOTE — Patient Outreach (Signed)
[facility]) Care Management  04/17/2020  Dakota Hall 10-06-1951 [id number]   Referral Date:04/03/20 Referral Source:MD Referral Referral Reason:Hemiplegia,decreased mobility, anticoagulation.  Incoming call from daughter [name]. Discussed referral. She states that they are in need of caregivers for her dad. She states that their mother was the patient's primary caregiver and she passed away back in 10/04/23.  She states she and her brother take turns going to patient in the evening to help with dinner and help him to bed.  She states a neighbor comes in daily to check on patient as well.    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  She also states he has dome HTN and DM as well but are controlled with medications.  She states that Frances Furbish has discharged patient from therapy recently.  She also states a nurse used to come due to an area that comes and goes to his bottom but she has stopped coming as area is healed.  Advised that if area becomes an issue in the future to notify MD but to provide good skin care, use of cushions, and encourage patient to reposition himself while sitting in the chair.  She states that patient can transfer on his own and able to do his ADL's with minimal assistance.    Discussed with daughter help in the home. Discussed PCS services and medicaid.  Daughter states that he does not qualify for medicaid.   Advised daughter that CM could send a list of agencies for private pay.  She is agreeable. Advised daughter to call CM when she receives the information to answer any questions.  She verbalized understanding.    Plan: RN CM will send community resources and daughter to call CM back as this is most convenient for her.  If no return call CM will outreach again in the month of June.    Bary Leriche, RN, MSN [facility] Care Management Care Management Coordinator Direct Line [phone] Cell [phone] Toll  Free: [phone]  Fax: [phone]

## 2020-04-18 DIAGNOSIS — F3341 Major depressive disorder, recurrent, in partial remission: Secondary | ICD-10-CM | POA: Diagnosis not present

## 2020-04-18 DIAGNOSIS — N183 Chronic kidney disease, stage 3 unspecified: Secondary | ICD-10-CM | POA: Diagnosis not present

## 2020-04-18 DIAGNOSIS — E1122 Type 2 diabetes mellitus with diabetic chronic kidney disease: Secondary | ICD-10-CM | POA: Diagnosis not present

## 2020-04-18 DIAGNOSIS — I10 Essential (primary) hypertension: Secondary | ICD-10-CM | POA: Diagnosis not present

## 2020-04-18 DIAGNOSIS — E785 Hyperlipidemia, unspecified: Secondary | ICD-10-CM | POA: Diagnosis not present

## 2020-04-18 DIAGNOSIS — E1169 Type 2 diabetes mellitus with other specified complication: Secondary | ICD-10-CM | POA: Diagnosis not present

## 2020-04-27 ENCOUNTER — Other Ambulatory Visit: Payer: Self-pay

## 2020-04-27 NOTE — Patient Outreach (Signed)
[facility]) Care Management  04/27/2020  ALADDIN KOLLMANN March 03, 1951 [id number]   Telephone call to daughter [name].  She states she did get the information provided for home care resources for her dad.  She is appreciative and offers no further questions concerns or further nurse follow up at this time.    Plan: RN CM will close case.  Bary Leriche, RN, MSN [facility] Care Management Care Management Coordinator Direct Line [phone] Cell [phone] Toll Free: [phone]  Fax: [phone]

## 2020-05-01 ENCOUNTER — Ambulatory Visit: Payer: Self-pay

## 2020-05-02 ENCOUNTER — Other Ambulatory Visit: Payer: Self-pay

## 2020-05-02 DIAGNOSIS — F319 Bipolar disorder, unspecified: Secondary | ICD-10-CM

## 2020-05-02 MED ORDER — ARIPIPRAZOLE 5 MG PO TABS: 5.0000 mg | ORAL_TABLET | Freq: Every day | ORAL | 0 refills | Status: DC

## 2020-05-03 ENCOUNTER — Other Ambulatory Visit: Payer: Self-pay

## 2020-05-03 ENCOUNTER — Ambulatory Visit (INDEPENDENT_AMBULATORY_CARE_PROVIDER_SITE_OTHER): Payer: Medicare HMO | Admitting: Psychiatry

## 2020-05-03 DIAGNOSIS — Z8673 Personal history of transient ischemic attack (TIA), and cerebral infarction without residual deficits: Secondary | ICD-10-CM

## 2020-05-03 DIAGNOSIS — F4320 Adjustment disorder, unspecified: Secondary | ICD-10-CM | POA: Diagnosis not present

## 2020-05-03 DIAGNOSIS — F331 Major depressive disorder, recurrent, moderate: Secondary | ICD-10-CM | POA: Diagnosis not present

## 2020-05-03 DIAGNOSIS — F411 Generalized anxiety disorder: Secondary | ICD-10-CM

## 2020-05-03 DIAGNOSIS — Z634 Disappearance and death of family member: Secondary | ICD-10-CM | POA: Diagnosis not present

## 2020-05-03 NOTE — Progress Notes (Signed)
Psychotherapy Progress Note [facility] Marliss Czar, PhD LP  Patient ID: Dakota Hall     MRN: [id number] Therapy format: Individual psychotherapy Date: 05/03/2020      Start: 1:09p     Stop: 1:59p     Time Spent: 50 min Location: In-person   Session narrative (presenting needs, interim history, self-report of stressors and symptoms, applications of prior therapy, status changes, and interventions made in session) Mood "pretty good", "have my times".  Has home companion 3 hrs/D, 3 D/k, gives shower, cooks, activities in the house (e.g., puzzles, cards, TV).  Also does wheelchair exercises together.  Looking forward to Olympics, likes watching gymnastics and swimming.  Today first time in a month out of the house today.  Content to stay in, pretty much.  Has friends who will be in touch, content with how much they do.  Will be trying to get back to church, working with the  seniors pastor to set up lay help getting there.  Can still feel like a burden, but maybe trusting better that he's not actually.  Feeling is about 4/10 intensity, down from about 8/10.  Encouraged to continue to practice trusting what children have said already and letting it be possible both that he brings conditions in need of unusual care and that no one feels it is in any way unfair of him.  Hx of relationship issue with daughter Dakota Hall as a teenager, about a year when she lied about where she was going.  Has not taken time to write to deceased wife Dakota Hall, doesn't feel the need to write a letter.  Trusts prayer as enough.  Continues to go through pictures without undue distress.  Mounting a project of some degree to make a scrapbook, currently accumulating pictures to use in it.  Not inclined to do this with a caregiver, too personal as yet.    Encouraged in getting his body or at least his eyes outside from time to time to freshen up his perspective, lift mood, and help anchor circadian rhythm.  Does  look out the front door and watch animals or count cars some.  Used to like to arrange artificial flowers with Dakota Hall, but averse to it now.  Young grandchildren come over some.  Discussed possibility of showing them memorabilia of Dakota Hall, creating more fun, shared remembrance experiences.  In session reminisced about the early days with Dakota Hall, meeting on a blind date (church softball game, lobbied for 2 years by [employer] cousin) in their mid-20s.  Therapeutic modalities: Cognitive Behavioral Therapy, Solution-Oriented/Positive Psychology and Faith-sensitive  Mental Status/Observations:  Appearance:   Casual and Neat     Behavior:  Appropriate  Motor:  Normal  Speech/Language:   dysarthric  Affect:  Appropriate  Mood:  less saddened  Thought process:  normal  Thought content:    WNL  Sensory/Perceptual disturbances:    WNL  Orientation:  Fully oriented  Attention:  Good    Concentration:  Good  Memory:  WNL  Insight:    Fair  Judgment:   Fair  Impulse Control:  Good   Risk Assessment: Danger to Self: No Self-injurious Behavior: No Danger to Others: No Physical Aggression / Violence: No Duty to Warn: No Access to Firearms a concern: No  Assessment of progress:  progressing  Diagnosis:   ICD-10-CM   1. Bereavement reaction  F43.20    Z63.4   2. Major depressive disorder, recurrent episode, moderate (HCC)  F33.1   3. Generalized anxiety  disorder  F41.1   4. History of stroke  Z86.73    Plan:  . Three projects: o Identify a few things of Dakota Hall's to show grandkids, possibly think of a story he'd like to tell them o Get outside time -- a little bit, even if don't fel like it o Continue gleaning pictures . Other recommendations/advice as may be noted above . Continue to utilize previously learned skills ad lib . Maintain medication as prescribed and work faithfully with relevant prescriber(s) if any changes are desired or seem indicated . Call the clinic on-call service, present  to ER, or call 911 if any life-threatening psychiatric crisis Return in about 1 month (around 06/02/2020). . Already scheduled visit in this office 06/21/2020.  Blanchie Serve, PhD Luan Moore, PhD LP Clinical Psychologist, [facility] [address], [place],  [address] [phone]

## 2020-05-10 DIAGNOSIS — Z7984 Long term (current) use of oral hypoglycemic drugs: Secondary | ICD-10-CM | POA: Diagnosis not present

## 2020-05-10 DIAGNOSIS — I10 Essential (primary) hypertension: Secondary | ICD-10-CM | POA: Diagnosis not present

## 2020-05-10 DIAGNOSIS — H25093 Other age-related incipient cataract, bilateral: Secondary | ICD-10-CM | POA: Diagnosis not present

## 2020-05-10 DIAGNOSIS — E119 Type 2 diabetes mellitus without complications: Secondary | ICD-10-CM | POA: Diagnosis not present

## 2020-05-10 DIAGNOSIS — H524 Presbyopia: Secondary | ICD-10-CM | POA: Diagnosis not present

## 2020-05-10 DIAGNOSIS — H5213 Myopia, bilateral: Secondary | ICD-10-CM | POA: Diagnosis not present

## 2020-05-22 ENCOUNTER — Ambulatory Visit: Payer: Medicare HMO | Admitting: Psychiatry

## 2020-05-30 DIAGNOSIS — E11622 Type 2 diabetes mellitus with other skin ulcer: Secondary | ICD-10-CM | POA: Diagnosis not present

## 2020-05-30 DIAGNOSIS — F3341 Major depressive disorder, recurrent, in partial remission: Secondary | ICD-10-CM | POA: Diagnosis not present

## 2020-05-30 DIAGNOSIS — Z7901 Long term (current) use of anticoagulants: Secondary | ICD-10-CM | POA: Diagnosis not present

## 2020-05-30 DIAGNOSIS — Z125 Encounter for screening for malignant neoplasm of prostate: Secondary | ICD-10-CM | POA: Diagnosis not present

## 2020-05-30 DIAGNOSIS — Z1211 Encounter for screening for malignant neoplasm of colon: Secondary | ICD-10-CM | POA: Diagnosis not present

## 2020-05-30 DIAGNOSIS — E1122 Type 2 diabetes mellitus with diabetic chronic kidney disease: Secondary | ICD-10-CM | POA: Diagnosis not present

## 2020-05-30 DIAGNOSIS — Z0000 Encounter for general adult medical examination without abnormal findings: Secondary | ICD-10-CM | POA: Diagnosis not present

## 2020-05-30 DIAGNOSIS — Z8673 Personal history of transient ischemic attack (TIA), and cerebral infarction without residual deficits: Secondary | ICD-10-CM | POA: Diagnosis not present

## 2020-05-30 DIAGNOSIS — I10 Essential (primary) hypertension: Secondary | ICD-10-CM | POA: Diagnosis not present

## 2020-05-30 DIAGNOSIS — N183 Chronic kidney disease, stage 3 unspecified: Secondary | ICD-10-CM | POA: Diagnosis not present

## 2020-05-30 DIAGNOSIS — I69151 Hemiplegia and hemiparesis following nontraumatic intracerebral hemorrhage affecting right dominant side: Secondary | ICD-10-CM | POA: Diagnosis not present

## 2020-05-30 DIAGNOSIS — L89152 Pressure ulcer of sacral region, stage 2: Secondary | ICD-10-CM | POA: Diagnosis not present

## 2020-06-14 ENCOUNTER — Ambulatory Visit (INDEPENDENT_AMBULATORY_CARE_PROVIDER_SITE_OTHER): Payer: Medicare HMO | Admitting: Psychiatry

## 2020-06-14 ENCOUNTER — Other Ambulatory Visit: Payer: Self-pay

## 2020-06-14 DIAGNOSIS — F331 Major depressive disorder, recurrent, moderate: Secondary | ICD-10-CM | POA: Diagnosis not present

## 2020-06-14 DIAGNOSIS — G4733 Obstructive sleep apnea (adult) (pediatric): Secondary | ICD-10-CM

## 2020-06-14 DIAGNOSIS — Z8673 Personal history of transient ischemic attack (TIA), and cerebral infarction without residual deficits: Secondary | ICD-10-CM | POA: Diagnosis not present

## 2020-06-14 DIAGNOSIS — F4320 Adjustment disorder, unspecified: Secondary | ICD-10-CM | POA: Diagnosis not present

## 2020-06-14 DIAGNOSIS — Z9989 Dependence on other enabling machines and devices: Secondary | ICD-10-CM

## 2020-06-14 DIAGNOSIS — Z634 Disappearance and death of family member: Secondary | ICD-10-CM

## 2020-06-14 NOTE — Progress Notes (Signed)
Psychotherapy Progress Note [facility] Marliss Czar, PhD LP  Patient ID: Dakota Hall     MRN: [id number] Therapy format: Individual psychotherapy Date: 06/14/2020      Start: 3:19p     Stop: 4:05p     Time Spent: 46 min Location: In-person   Session narrative (presenting needs, interim history, self-report of stressors and symptoms, applications of prior therapy, status changes, and interventions made in session) Went through pictures of [name] with kids and grandkids, helped to see her image again and to enjoy being with his living family over it.  Stories and laughter very healing, plus getting to see himself younger.  Initiated by his son [name], to everyone's benefit.  No further looking through pictures for scrapbook/memorial -- too intimidating but would do it with kids if asked.    Re. quiet time, watching a lot of Olympics lately.  Sleeping 2-3 hrs a day in a couple naps (in recliner, not bed), in addition to sleeping 11 hrs at night.  Confesses he sleeps to avoid being wake, bored, or grieving.  Educated about oversleep changing endocrine function and promoting depression.  Reveals he has OSA and is on CPAP, with reliable use.  Definitely depressing to have limited mobility and seemingly limited opportunity to do enjoyable things.  Discussed need for further activities and connections to provide reasons to be awake.  Options for crafting, possible flower arranging, most immediate cause being an arrangement for [name].  Just got the marker down, visited with his brother last weekend, helpful.  Resolved to ask D [name] to schedule flower shopping and arranging for after her vacation, coming up.  Also would like to get some cleaning/decluttering done in the basement, with Theora Gianotti.  Discussed possibility of asking the CNA who visits to help him get to areas of the house where he can go through things.  Gets monthly calls from church friends.  Admits he would like to  see them in person, but believes they are concerned about COVID.  Advised to check it out further, agreed to invite them to the house, or to call more often.  Did go outside as asked, but once, the other night, when neighbors called with the idea to visit, outside.  Lasted 2 hours, pleasant time, and they offered to ride him around in their golf cart.  Didn't take them up on it, but would enjoy it.  Discussed and agreed to follow up with them to accept the invitation.  Apprehensive about kids going out of town and being out of touch.  Has caregivers part time, may step up their schedule to be sure he has ready contact in need.    Hospital bed coming soon to his living room so he can sleep more comfortably than in the recliner and be able to operate it to assist him in getting into bed.  Has plans in the works to get OT back out to the house to work with him on independent bed transfers.  Therapeutic modalities: Cognitive Behavioral Therapy, Solution-Oriented/Positive Psychology and Grief Therapy  Mental Status/Observations:  Appearance:   Casual and Neat     Behavior:  Appropriate  Motor:  wheelchair bound, limited movement  Speech/Language:   stroke-affected, intelligible and goal-directed  Affect:  Appropriate  Mood:  sad and more responsive  Thought process:  normal  Thought content:    Rumination and on bereavement  Sensory/Perceptual disturbances:    WNL  Orientation:  Fully oriented  Attention:  Good    Concentration:  Good  Memory:  grossly intact  Insight:    Fair  Judgment:   Fair  Impulse Control:  Fair   Risk Assessment: Danger to Self: No Self-injurious Behavior: No Danger to Others: No Physical Aggression / Violence: No Duty to Warn: No Access to Firearms a concern: No  Assessment of progress:  progressing  Diagnosis:   ICD-10-CM   1. Bereavement reaction  F43.20    Z63.4   2. Major depressive disorder, recurrent episode, moderate (HCC)  F33.1   3. History of  stroke  Z86.73    Plan:  . Ask [name] to set a date to do flowers for [name] . Tell neighbors yes to the golf cart idea and schedule it . Ask at least one church friend to visit in person or call more often . Senior Line referral if interested later in phone support . Follow through with in-home therapy requests to set up more independent, comfortable sleeping arrangement   . Recommend targeting time to declutter or process wife's belongings, and ask home health for help getting to the work and setting up if needed . Try to reduce nap time or long sleep time . Overall, be bolder in asking for time and getting outside . Other recommendations/advice as may be noted above . Continue to utilize previously learned skills ad lib . Maintain medication as prescribed and work faithfully with relevant prescriber(s) if any changes are desired or seem indicated . Call the clinic on-call service, present to ER, or call 911 if any life-threatening psychiatric crisis Return 2-6 weeks as feasible. . Already scheduled visit in this office 06/21/2020.  Robley Fries, PhD Marliss Czar, PhD LP Clinical Psychologist, [facility] [address], [address] [place], [place] [address] [phone]

## 2020-06-15 DIAGNOSIS — E1169 Type 2 diabetes mellitus with other specified complication: Secondary | ICD-10-CM | POA: Diagnosis not present

## 2020-06-15 DIAGNOSIS — I10 Essential (primary) hypertension: Secondary | ICD-10-CM | POA: Diagnosis not present

## 2020-06-15 DIAGNOSIS — N183 Chronic kidney disease, stage 3 unspecified: Secondary | ICD-10-CM | POA: Diagnosis not present

## 2020-06-15 DIAGNOSIS — E1122 Type 2 diabetes mellitus with diabetic chronic kidney disease: Secondary | ICD-10-CM | POA: Diagnosis not present

## 2020-06-15 DIAGNOSIS — F3341 Major depressive disorder, recurrent, in partial remission: Secondary | ICD-10-CM | POA: Diagnosis not present

## 2020-06-15 DIAGNOSIS — E785 Hyperlipidemia, unspecified: Secondary | ICD-10-CM | POA: Diagnosis not present

## 2020-06-21 ENCOUNTER — Telehealth (INDEPENDENT_AMBULATORY_CARE_PROVIDER_SITE_OTHER): Payer: Medicare HMO | Admitting: Psychiatry

## 2020-06-21 ENCOUNTER — Encounter: Payer: Self-pay | Admitting: Psychiatry

## 2020-06-21 DIAGNOSIS — F341 Dysthymic disorder: Secondary | ICD-10-CM

## 2020-06-21 DIAGNOSIS — F319 Bipolar disorder, unspecified: Secondary | ICD-10-CM

## 2020-06-21 DIAGNOSIS — F4001 Agoraphobia with panic disorder: Secondary | ICD-10-CM

## 2020-06-21 DIAGNOSIS — Z9989 Dependence on other enabling machines and devices: Secondary | ICD-10-CM | POA: Diagnosis not present

## 2020-06-21 DIAGNOSIS — Z8673 Personal history of transient ischemic attack (TIA), and cerebral infarction without residual deficits: Secondary | ICD-10-CM

## 2020-06-21 DIAGNOSIS — G4733 Obstructive sleep apnea (adult) (pediatric): Secondary | ICD-10-CM

## 2020-06-21 DIAGNOSIS — F331 Major depressive disorder, recurrent, moderate: Secondary | ICD-10-CM

## 2020-06-21 DIAGNOSIS — F4321 Adjustment disorder with depressed mood: Secondary | ICD-10-CM

## 2020-06-21 DIAGNOSIS — F411 Generalized anxiety disorder: Secondary | ICD-10-CM | POA: Diagnosis not present

## 2020-06-21 MED ORDER — SERTRALINE HCL 100 MG PO TABS: 200.0000 mg | ORAL_TABLET | Freq: Every day | ORAL | 2 refills | Status: DC

## 2020-06-21 MED ORDER — ARIPIPRAZOLE 5 MG PO TABS: 5.0000 mg | ORAL_TABLET | Freq: Every day | ORAL | 3 refills | Status: DC

## 2020-06-21 NOTE — Progress Notes (Signed)
Dakota Hall [id number] 11/19/1950 69 y.o.  Virtual Visit via WebEX  I connected with pt by WebEx and verified that I am speaking with the correct person using two identifiers.   I discussed the limitations, risks, security and privacy concerns of performing an evaluation and management service by WebEX and the availability of in person appointments. I also discussed with the patient that there may be a patient responsible charge related to this service. The patient expressed understanding and agreed to proceed.  I discussed the assessment and treatment plan with the patient. The patient was provided an opportunity to ask questions and all were answered. The patient agreed with the plan and demonstrated an understanding of the instructions.   The patient was advised to call back or seek an in-person evaluation if the symptoms worsen or if the condition fails to improve as anticipated.  I provided 30 minutes of video time during this encounter. The call started at 130 and ended at 200. The patient was located at home and the provider was located office.   Subjective:   Patient ID:  Dakota Hall is a 69 y.o. (DOB 05/15/1951) male.  Chief Complaint:  Chief Complaint  Patient presents with  . Follow-up  . Depression  . Anxiety    Depression        Associated symptoms include decreased concentration and fatigue.  Associated symptoms include no headaches and no suicidal ideas.  Dakota Hall  Dakota Hall presents today for follow-up of depression and anxiety.    Last seen January 2021.  No med changes then. Wife died October 17, 2019 from heart attack. Sudden and unexpected by was on dialysis.    Still living at the same home by himself but has help coming in 3 days/week.  Someone helps with the meds. Kids help.  Lives alone.  Off an on OK with it.  Lonesome.    06/21/20 appt with the following noted: Pretty good overall. So so.  Feels a burden to people.  .  Mild chronic depression.   Very  consistent with meds. No SE. depression and anxiety are generally better than last time except when thinks about wife.   Pt reports no sleep issues in lift chair.  Often wants to sleep a lot. Pt reports that appetite is good. Pt reports that energy is lethargic and no change and poor motivation. Concentration is improved and poor. Suicidal thoughts:  denied by patient.  Takes meds himself in a pill box kid's fixes.  Compliant.  Past Psychiatric Medication Trials: Effexor, Zoloft 200, Abilify 5, Wellbutrin, riperidone.  Trazodone, buspirone, lithium  Review of Systems:  Review of Systems  Constitutional: Positive for fatigue.  Neurological: Positive for speech difficulty and weakness. Negative for headaches.  Psychiatric/Behavioral: Positive for decreased concentration, depression and dysphoric mood. Negative for agitation, behavioral problems, confusion, hallucinations, self-injury, sleep disturbance and suicidal ideas. The patient is nervous/anxious. The patient is not hyperactive.     Medications: I have reviewed the patient's current medications.  Current Outpatient Medications  Medication Sig Dispense Refill  . acetaminophen (TYLENOL) 325 MG tablet Take 2 tablets (650 mg total) by mouth every 6 (six) hours as needed for mild pain (or Fever >/= 101).    . amLODipine (NORVASC) 10 MG tablet Take 1 tablet (10 mg total) by mouth daily. 30 tablet 0  . ARIPiprazole (ABILIFY) 5 MG tablet Take 1 tablet (5 mg total) by mouth daily. 90 tablet 3  . baclofen (LIORESAL) 20 MG tablet   Take 1 tablet (20 mg total) by mouth 3 (three) times daily as needed for muscle spasms. 30 each 0  . Blood Glucose Monitoring Suppl (ONE TOUCH ULTRA 2) W/DEVICE KIT     . docusate sodium (COLACE) 100 MG capsule Take 1 capsule (100 mg total) by mouth 3 (three) times a week. M-W-F 12 capsule 0  . ELIQUIS 2.5 MG TABS tablet     . glucose blood (ONE TOUCH ULTRA TEST) test strip Use each time you check your blood sugar. 100  each 0  . hydrALAZINE (APRESOLINE) 25 MG tablet Take 1 tablet (25 mg total) by mouth 2 (two) times daily. 60 tablet 0  . isosorbide mononitrate (IMDUR) 30 MG 24 hr tablet Take 1 tablet (30 mg total) by mouth daily. 30 tablet 0  . lidocaine (LIDODERM) 5 % Place 1 patch onto the skin daily. Remove & Discard patch within 12 hours or as directed by MD 30 patch 0  . Menthol, Topical Analgesic, (BIOFREEZE) 4 % GEL Apply 1 application topically 2 (two) times daily as needed (Pain). Apply to joints 89 mL 0  . nystatin (MYCOSTATIN/NYSTOP) powder Apply topically 2 (two) times daily. 15 g 0  . olmesartan (BENICAR) 5 MG tablet Take 1 tablet (5 mg total) by mouth daily. 30 tablet 0  . ondansetron (ZOFRAN) 4 MG tablet Take 1 tablet (4 mg total) by mouth every 6 (six) hours as needed for nausea or vomiting. 30 tablet 0  . ONETOUCH DELICA LANCETS 33G MISC 1 each by Does not apply route daily as needed. 100 each 0  . pioglitazone (ACTOS) 15 MG tablet Take 0.5 tablets (7.5 mg total) by mouth daily. 15 tablet 0  . potassium chloride SA (K-DUR,KLOR-CON) 20 MEQ tablet Take 1 tablet (20 mEq total) by mouth daily. 30 tablet 0  . sertraline (ZOLOFT) 100 MG tablet Take 2 tablets (200 mg total) by mouth daily. 180 tablet 2  . simvastatin (ZOCOR) 20 MG tablet Take 1 tablet (20 mg total) by mouth daily at 6 PM. 30 tablet 0  . traMADol (ULTRAM) 50 MG tablet Take 1 tablet (50 mg total) by mouth 2 (two) times daily as needed for moderate pain. 30 tablet 0  . Vitamin D, Ergocalciferol, (DRISDOL) 50000 UNITS CAPS capsule Take 50,000 Units by mouth every Friday.      No current facility-administered medications for this visit.    Medication Side Effects: None  Allergies:  Allergies  Allergen Reactions  . Penicillins Hives and Other (See Comments)    Has patient had a PCN reaction causing immediate rash, facial/tongue/throat swelling, SOB or lightheadedness with hypotension: no Has patient had a PCN reaction causing severe  rash involving mucus membranes or skin necrosis: no Has patient had a PCN reaction that required hospitalization: no Has patient had a PCN reaction occurring within the last 10 years: no If all of the above answers are "NO", then may proceed with Cephalosporin use.     Past Medical History:  Diagnosis Date  . 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 Back spasm   . Chronic kidney disease (CKD), stage III (moderate)   . Depression with anxiety   . Diabetes mellitus   . Dysarthria   . Dyslipidemia   . Hypertension   . Obesity   . OSA on CPAP   . Pulmonary emboli (HCC) 02/2014  . Stasis edema of both lower extremities   . Stroke (HCC) July 2013   left paramedian pontine, incidental right   parietal subcortical. both d/t small vessel disease  . Stroke [facility]) March 2015   left basal ganglia secondary to small vessel disease    Family History  Problem Relation Age of Onset  . Cancer Mother   . Heart disease Father     Social History   Socioeconomic History  . Marital status: Married    Spouse name: Mickel Baas   . Number of children: 2  . Years of education: College   . Highest education level: Not on file  Occupational History  . Occupation: disabled    Employer: [employer]  . Occupation: PSR    Employer: [employer]  Tobacco Use  . Smoking status: Never Smoker  . Smokeless tobacco: Never Used  Substance and Sexual Activity  . Alcohol use: No    Alcohol/week: 0.0 standard drinks  . Drug use: No  . Sexual activity: Not on file  Other Topics Concern  . Not on file  Social History Narrative   Patient lives at home with wife Mickel Baas.    Patient has 2 children.    Patient is currently working.    Patient has a Degree.    Social Determinants of Health   Financial Resource Strain:   . Difficulty of Paying Living Expenses:   Food Insecurity:   . Worried About Charity fundraiser in the Last Year:   . Arboriculturist in the Last Year:    Transportation Needs:   . Film/video editor (Medical):   Marland Kitchen Lack of Transportation (Non-Medical):   Physical Activity:   . Days of Exercise per Week:   . Minutes of Exercise per Session:   Stress:   . Feeling of Stress :   Social Connections:   . Frequency of Communication with Friends and Family:   . Frequency of Social Gatherings with Friends and Family:   . Attends Religious Services:   . Active Member of Clubs or Organizations:   . Attends Archivist Meetings:   Marland Kitchen Marital Status:   Intimate Partner Violence:   . Fear of Current or Ex-Partner:   . Emotionally Abused:   Marland Kitchen Physically Abused:   . Sexually Abused:     Past Medical History, Surgical history, Social history, and Family history were reviewed and updated as appropriate.   Please see review of systems for further details on the patient's review from today.   Objective:   Physical Exam:  There were no vitals taken for this visit.  Physical Exam Neurological:     Mental Status: He is alert and oriented to person, place, and time.     Cranial Nerves: Dysarthria present.  Psychiatric:        Attention and Perception: Attention and perception normal.        Mood and Affect: Mood is anxious and depressed. Affect is not tearful.        Speech: Speech is slurred.        Behavior: Behavior is cooperative.        Thought Content: Thought content normal. Thought content is not paranoid or delusional. Thought content does not include homicidal or suicidal ideation. Thought content does not include homicidal or suicidal plan.        Cognition and Memory: Cognition and memory normal.     Comments: Insight fair and judment fair. Speech affected by history of stroke.     Lab Review:     Component Value Date/Time   NA 140 09/16/2019 1841  NA 142 07/14/1951 0000   K 3.8 09/16/2019 1841   CL 105 09/16/2019 1841   CO2 24 09/16/2019 1841   GLUCOSE 82 09/16/2019 1841   BUN 23 09/16/2019 1841   BUN 16  02/27/1951 0000   CREATININE 1.48 (H) 09/16/2019 1841   CALCIUM 9.5 09/16/2019 1841   PROT 7.3 03/25/2018 0549   ALBUMIN 3.5 03/25/2018 0549   AST 18 03/25/2018 0549   ALT 20 03/25/2018 0549   ALKPHOS 85 03/25/2018 0549   BILITOT 0.7 03/25/2018 0549   GFRNONAA 48 (L) 09/16/2019 1841   GFRAA 56 (L) 09/16/2019 1841       Component Value Date/Time   WBC 6.2 09/16/2019 1841   RBC 5.25 09/16/2019 1841   HGB 15.8 09/16/2019 1841   HCT 48.2 09/16/2019 1841   PLT 122 (L) 09/16/2019 1841   MCV 91.8 09/16/2019 1841   MCH 30.1 09/16/2019 1841   MCHC 32.8 09/16/2019 1841   RDW 13.3 09/16/2019 1841   LYMPHSABS 1.4 09/16/2019 1841   MONOABS 0.5 09/16/2019 1841   EOSABS 0.1 09/16/2019 1841   BASOSABS 0.0 09/16/2019 1841    No results found for: POCLITH, LITHIUM   No results found for: PHENYTOIN, PHENOBARB, VALPROATE, CBMZ   .res Assessment: Plan:    Major depressive disorder, recurrent episode, moderate (HCC)  Generalized anxiety disorder  Dysthymia  Panic disorder with agoraphobia  Grief  History of stroke  OSA on CPAP  Bipolar affective disorder, remission status unspecified (HCC) - Plan: ARIPiprazole (ABILIFY) 5 MG tablet, sertraline (ZOLOFT) 100 MG tablet   Greater than 50% of 30 min non face to face time with patient was spent on counseling and coordination of care. We discussed Dakota Hall is back to his baseline of mild chronic depression and anxiety.  He has had multiple relapses in the past due to noncompliance with medication but now his kid's administers the medication.  He also had relapse with reductions in the dosage of Abilify and sertraline.  He is back to baseline with the current dosages.  He is not having side effects.  He is satisfied with the medication.    Disc grief over loss of wife in detail.  She was taking care of him.  Disc potential placement if necessary.  Disc self care.  He's maintaining.  Discussed potential metabolic side effects  associated with atypical antipsychotics, as well as potential risk for movement side effects. Advised pt to contact office if movement side effects occur.  no evidence for movement disorder  Follow-up 6 to 9 months because he is stable again.  It is not worth the risk to try to change the medicines further and he agrees.  No indication to change meds. None no med changes this visit   Cottle, MD, DFAPA   Please see After Visit Summary for patient specific instructions.  Future Appointments  Date Time Provider Department Center  07/25/2020 11:00 AM Mitchum, Robert, PhD [facility] None    No orders of the defined types were placed in this encounter.     ------------------------------- 

## 2020-07-11 DIAGNOSIS — I10 Essential (primary) hypertension: Secondary | ICD-10-CM | POA: Diagnosis not present

## 2020-07-11 DIAGNOSIS — E1169 Type 2 diabetes mellitus with other specified complication: Secondary | ICD-10-CM | POA: Diagnosis not present

## 2020-07-11 DIAGNOSIS — F3341 Major depressive disorder, recurrent, in partial remission: Secondary | ICD-10-CM | POA: Diagnosis not present

## 2020-07-11 DIAGNOSIS — N183 Chronic kidney disease, stage 3 unspecified: Secondary | ICD-10-CM | POA: Diagnosis not present

## 2020-07-11 DIAGNOSIS — E785 Hyperlipidemia, unspecified: Secondary | ICD-10-CM | POA: Diagnosis not present

## 2020-07-11 DIAGNOSIS — E1122 Type 2 diabetes mellitus with diabetic chronic kidney disease: Secondary | ICD-10-CM | POA: Diagnosis not present

## 2020-07-25 ENCOUNTER — Encounter: Payer: Self-pay | Admitting: Psychiatry

## 2020-07-25 ENCOUNTER — Ambulatory Visit: Payer: Medicare HMO | Admitting: Psychiatry

## 2020-07-25 NOTE — Progress Notes (Signed)
Admin note for non-service contact  Patient ID: Dakota Hall  MRN: [id number] DATE: 07/25/2020  Cancelled short notice due to stormy weather and implications for wheelchair.  (Power was, in fact, interrupted at office earlier for that reason, including elevator service.)  Waive cancellation fee, RS as able.  Robley Fries, PhD Marliss Czar, PhD LP Clinical Psychologist, [facility] [address], [address] [place], [place] [address] [phone]

## 2020-07-27 ENCOUNTER — Other Ambulatory Visit: Payer: Self-pay | Admitting: Psychiatry

## 2020-07-27 DIAGNOSIS — F319 Bipolar disorder, unspecified: Secondary | ICD-10-CM

## 2020-08-13 ENCOUNTER — Other Ambulatory Visit: Payer: Self-pay

## 2020-08-13 ENCOUNTER — Ambulatory Visit (INDEPENDENT_AMBULATORY_CARE_PROVIDER_SITE_OTHER): Payer: Medicare HMO | Admitting: Psychiatry

## 2020-08-13 DIAGNOSIS — F331 Major depressive disorder, recurrent, moderate: Secondary | ICD-10-CM

## 2020-08-13 DIAGNOSIS — F4321 Adjustment disorder with depressed mood: Secondary | ICD-10-CM

## 2020-08-13 NOTE — Progress Notes (Signed)
Psychotherapy Progress Note [facility] Marliss Czar, PhD LP  Patient ID: Dakota Hall     MRN: [id number] Therapy format: Individual psychotherapy Date: 08/13/2020      Start: 11:15a     Stop: 12:00n     Time Spent: 45 min Location: In-person   Session narrative (presenting needs, interim history, self-report of stressors and symptoms, applications of prior therapy, status changes, and interventions made in session) No changes in living situation past 2 months, still 3/wk caregiver 3 hrs/trip for light housework, fix lunch.  Still feels depressed, long days.  Finds himself taking naps 1-2 per day to escape, about 12 hrs per 24 hrs, in regular times.  Normal appetite, same energy.  Wonder why he's here, can wish he were dead but no SI, just wish to reunite with [name].  Pleased with time with his kids and grandkids, gathering of 9 yesterday.  [name] and his wife expecting in March.  Didn't end up shopping flowers with [name], but they talked about it.  Decided on fall flowers for [name], natural ones grown at his house, will work on those together soon.  Has not asked his aide about getting out memorabilia (pictures, mainly) to go through -- too conflicted, afraid he will feel too torn up if he looks at them.  Reminded that the scrapbook project is something he intends in love, a love that wants to complete itself.  Breaks down in tears talking about it, misses her.  Allowed to cry it out for a few minutes.  With no discernible effort to change his focus, engaged him in storytelling.  Retold how [name]'s aunt had been trying to set them up for two years, finally went on a double date with her cousin, and love for [name] at first sight sparked up there.  An unused house long on their property was demolished this morning.  They used to store things in it until it became too dilapidated.  [name] would be glad it's gone.  Son in law is cleaning up the land now, with plans to grade,  seed.  Affirmed the work of beautifying as fitting for her and him both.  Getting more calls, both on phone and in-person, from friends.  Says they just started up more, he didn't ask, that he had felt nervous about asking for more visitation, then forgot.  Probed further wishes to be in touch with people, especially people he grew up with, like Becky and her husband [name] and have them over.  Agrees to make a call and invite them over.  Option to go ahead and start looking into his photo collection to find good selections for the scrapbook, visiting with them, or both.  Therapeutic modalities: Cognitive Behavioral Therapy, Solution-Oriented/Positive Psychology and Grief Therapy  Mental Status/Observations:  Appearance:   Casual     Behavior:  Appropriate  Motor:  w/c bound, righ hemiparesis  Speech/Language:   goal-directed, limited output, articulation issue  Affect:  Appropriate and Tearful  Mood:  sad  Thought process:  concrete  Thought content:    WNL  Sensory/Perceptual disturbances:    WNL  Orientation:  Fully oriented  Attention:  Good    Concentration:  Good  Memory:  WNL  Insight:    Fair  Judgment:   Good  Impulse Control:  Good   Risk Assessment: Danger to Self: No Self-injurious Behavior: No Danger to Others: No Physical Aggression / Violence: No Duty to Warn: No Access to Firearms  a concern: No  Assessment of progress:  stabilized  Diagnosis:   ICD-10-CM   1. Grief  F43.21   2. Major depressive disorder, recurrent episode, moderate (HCC)  F33.1    Plan:  . Invite friends over . Begin looking into pictures, even if it brings tears . Continue ready to share stories, talk to [name] in spirit as needed to process grief . Other recommendations/advice as may be noted above . Continue to utilize previously learned skills ad lib . Maintain medication as prescribed and work faithfully with relevant prescriber(s) if any changes are desired or seem indicated . Call the  clinic on-call service, present to ER, or call 911 if any life-threatening psychiatric crisis Return in about 1 month (around 09/12/2020). . Already scheduled visit in this office Visit date not found.  Robley Fries, PhD Marliss Czar, PhD LP Clinical Psychologist, [facility] [address] [place], [place] [address] [phone]

## 2020-09-04 DIAGNOSIS — E785 Hyperlipidemia, unspecified: Secondary | ICD-10-CM | POA: Diagnosis not present

## 2020-09-04 DIAGNOSIS — I10 Essential (primary) hypertension: Secondary | ICD-10-CM | POA: Diagnosis not present

## 2020-09-04 DIAGNOSIS — E1122 Type 2 diabetes mellitus with diabetic chronic kidney disease: Secondary | ICD-10-CM | POA: Diagnosis not present

## 2020-09-04 DIAGNOSIS — E1169 Type 2 diabetes mellitus with other specified complication: Secondary | ICD-10-CM | POA: Diagnosis not present

## 2020-09-04 DIAGNOSIS — F3341 Major depressive disorder, recurrent, in partial remission: Secondary | ICD-10-CM | POA: Diagnosis not present

## 2020-09-04 DIAGNOSIS — N183 Chronic kidney disease, stage 3 unspecified: Secondary | ICD-10-CM | POA: Diagnosis not present

## 2020-09-19 ENCOUNTER — Ambulatory Visit (INDEPENDENT_AMBULATORY_CARE_PROVIDER_SITE_OTHER): Payer: Medicare HMO | Admitting: Psychiatry

## 2020-09-19 ENCOUNTER — Other Ambulatory Visit: Payer: Self-pay

## 2020-09-19 DIAGNOSIS — F331 Major depressive disorder, recurrent, moderate: Secondary | ICD-10-CM

## 2020-09-19 DIAGNOSIS — F4320 Adjustment disorder, unspecified: Secondary | ICD-10-CM | POA: Diagnosis not present

## 2020-09-19 DIAGNOSIS — Z634 Disappearance and death of family member: Secondary | ICD-10-CM

## 2020-09-19 DIAGNOSIS — Z8673 Personal history of transient ischemic attack (TIA), and cerebral infarction without residual deficits: Secondary | ICD-10-CM

## 2020-09-19 NOTE — Progress Notes (Signed)
Psychotherapy Progress Note [facility] Dakota Czar, PhD LP  Patient ID: Dakota Hall     MRN: [id number] Therapy format: Individual psychotherapy Date: 09/19/2020      Start: 11:30a     Stop: 12:09p     Time Spent: 39 min Location: In-person   Session narrative (presenting needs, interim history, self-report of stressors and symptoms, applications of prior therapy, status changes, and interventions made in session) Has not yet done the flower project for Dakota Hall's grave, admits putting it off.  Encouraged go ahead, if he means it as important, and to either dig into it as a gesture of love or else decide that it's not something he "needs", and Dakota Hall doesn't "need" it either.  Says he has re-decided to use artificial flowers now, not depend on the natural ones growing at his house (Dakota Hall from last month).  Decided not to look through pictures for the scrapbook.  Doesn't want to feel his own grief more for seeing them.  Challenged also as optional whether he does a project or not, that Dakota Hall doesn't need him to do it, but he should get Hall off the fence so it does not become part of a depression recipe, e.g., telling Hall he "should" do things but putting them off.    Did call Dakota Hall and Dakota Hall to invite over, but assumed from some other things he shouldn't ask about coming over.  Challenged to ask again, b/c depression lets it go until out of touch.    Nov 30 is 1-year anniversary of Dakota Hall's death.  Dreads Thanksgiving and Christmas.  Challenged to consider Dakota Hall's wishes, encouraged be sure he won't let her husband "go to seed" in how he lives his days now.  Wants to hold a month till next visit.  Challenged to consider better benefit of more frequent service, but the travel can be hard to arrange with PCA.  Therapeutic modalities: Cognitive Behavioral Therapy and Solution-Oriented/Positive Psychology  Mental Status/Observations:  Appearance:   Casual      Behavior:  Appropriate  Motor:  wheelchair  Speech/Language:   slurred due to stroke  Affect:  Depressed  Mood:  depressed  Thought process:  concrete  Thought content:    WNL  Sensory/Perceptual disturbances:    WNL  Orientation:  Fully oriented  Attention:  Good    Concentration:  Good  Memory:  grossly intact  Insight:    Fair  Judgment:   Fair  Impulse Control:  Fair   Risk Assessment: Danger to Self: No Self-injurious Behavior: No Danger to Others: No Physical Aggression / Violence: No Duty to Warn: No Access to Firearms a concern: No  Assessment of progress:  stabilized  Diagnosis:   ICD-10-CM   1. Bereavement reaction  F43.20    Z63.4   2. Major depressive disorder, recurrent episode, moderate (HCC)  F33.1   3. History of stroke  Z86.73    Plan:  . Engage friends and neighbors more . Avoid substantial naps daytime . Notice and dispute depressive assumptions about people being unavailable or disinterested and be willing to ask . Follow through on gravesite flowers with daughter Dakota Hall . Ask PCA Dakota Hall to help ready craft materials he cannot manage with limited dexterity . Consider inquiring with church about uses for his crafting and other skills . Other recommendations/advice as may be noted above . Continue to utilize previously learned skills ad lib . Maintain medication as prescribed and work faithfully with relevant prescriber(s) if any  changes are desired or seem indicated . Call the clinic on-call service, present to ER, or call 911 if any life-threatening psychiatric crisis Return in about 1 month (around 10/19/2020) for available earlier @ PT's need. . Already scheduled visit in this office Visit date not found.  Dakota Fries, PhD Dakota Czar, PhD LP Clinical Psychologist, [facility] [address] [place], [place] [address] [phone]

## 2020-10-01 DIAGNOSIS — E1169 Type 2 diabetes mellitus with other specified complication: Secondary | ICD-10-CM | POA: Diagnosis not present

## 2020-10-01 DIAGNOSIS — G4700 Insomnia, unspecified: Secondary | ICD-10-CM | POA: Diagnosis not present

## 2020-10-01 DIAGNOSIS — E785 Hyperlipidemia, unspecified: Secondary | ICD-10-CM | POA: Diagnosis not present

## 2020-10-01 DIAGNOSIS — N183 Chronic kidney disease, stage 3 unspecified: Secondary | ICD-10-CM | POA: Diagnosis not present

## 2020-10-01 DIAGNOSIS — I10 Essential (primary) hypertension: Secondary | ICD-10-CM | POA: Diagnosis not present

## 2020-10-01 DIAGNOSIS — F3341 Major depressive disorder, recurrent, in partial remission: Secondary | ICD-10-CM | POA: Diagnosis not present

## 2020-10-01 DIAGNOSIS — E1122 Type 2 diabetes mellitus with diabetic chronic kidney disease: Secondary | ICD-10-CM | POA: Diagnosis not present

## 2020-10-10 DIAGNOSIS — N183 Chronic kidney disease, stage 3 unspecified: Secondary | ICD-10-CM | POA: Diagnosis not present

## 2020-10-10 DIAGNOSIS — L89152 Pressure ulcer of sacral region, stage 2: Secondary | ICD-10-CM | POA: Diagnosis not present

## 2020-10-10 DIAGNOSIS — Z23 Encounter for immunization: Secondary | ICD-10-CM | POA: Diagnosis not present

## 2020-10-10 DIAGNOSIS — E1122 Type 2 diabetes mellitus with diabetic chronic kidney disease: Secondary | ICD-10-CM | POA: Diagnosis not present

## 2020-10-10 DIAGNOSIS — I10 Essential (primary) hypertension: Secondary | ICD-10-CM | POA: Diagnosis not present

## 2020-10-10 DIAGNOSIS — E785 Hyperlipidemia, unspecified: Secondary | ICD-10-CM | POA: Diagnosis not present

## 2020-10-10 DIAGNOSIS — Z8673 Personal history of transient ischemic attack (TIA), and cerebral infarction without residual deficits: Secondary | ICD-10-CM | POA: Diagnosis not present

## 2020-10-19 DIAGNOSIS — I69351 Hemiplegia and hemiparesis following cerebral infarction affecting right dominant side: Secondary | ICD-10-CM | POA: Diagnosis not present

## 2020-10-19 DIAGNOSIS — M1830 Unilateral post-traumatic osteoarthritis of first carpometacarpal joint, unspecified hand: Secondary | ICD-10-CM | POA: Diagnosis not present

## 2020-10-19 DIAGNOSIS — I129 Hypertensive chronic kidney disease with stage 1 through stage 4 chronic kidney disease, or unspecified chronic kidney disease: Secondary | ICD-10-CM | POA: Diagnosis not present

## 2020-10-19 DIAGNOSIS — E1122 Type 2 diabetes mellitus with diabetic chronic kidney disease: Secondary | ICD-10-CM | POA: Diagnosis not present

## 2020-10-19 DIAGNOSIS — E1151 Type 2 diabetes mellitus with diabetic peripheral angiopathy without gangrene: Secondary | ICD-10-CM | POA: Diagnosis not present

## 2020-10-19 DIAGNOSIS — L89152 Pressure ulcer of sacral region, stage 2: Secondary | ICD-10-CM | POA: Diagnosis not present

## 2020-10-19 DIAGNOSIS — G4733 Obstructive sleep apnea (adult) (pediatric): Secondary | ICD-10-CM | POA: Diagnosis not present

## 2020-10-19 DIAGNOSIS — I87303 Chronic venous hypertension (idiopathic) without complications of bilateral lower extremity: Secondary | ICD-10-CM | POA: Diagnosis not present

## 2020-10-19 DIAGNOSIS — I69322 Dysarthria following cerebral infarction: Secondary | ICD-10-CM | POA: Diagnosis not present

## 2020-10-20 DIAGNOSIS — I69322 Dysarthria following cerebral infarction: Secondary | ICD-10-CM | POA: Diagnosis not present

## 2020-10-20 DIAGNOSIS — M1830 Unilateral post-traumatic osteoarthritis of first carpometacarpal joint, unspecified hand: Secondary | ICD-10-CM | POA: Diagnosis not present

## 2020-10-20 DIAGNOSIS — E1122 Type 2 diabetes mellitus with diabetic chronic kidney disease: Secondary | ICD-10-CM | POA: Diagnosis not present

## 2020-10-20 DIAGNOSIS — L89152 Pressure ulcer of sacral region, stage 2: Secondary | ICD-10-CM | POA: Diagnosis not present

## 2020-10-20 DIAGNOSIS — I129 Hypertensive chronic kidney disease with stage 1 through stage 4 chronic kidney disease, or unspecified chronic kidney disease: Secondary | ICD-10-CM | POA: Diagnosis not present

## 2020-10-20 DIAGNOSIS — I69351 Hemiplegia and hemiparesis following cerebral infarction affecting right dominant side: Secondary | ICD-10-CM | POA: Diagnosis not present

## 2020-10-20 DIAGNOSIS — E1151 Type 2 diabetes mellitus with diabetic peripheral angiopathy without gangrene: Secondary | ICD-10-CM | POA: Diagnosis not present

## 2020-10-20 DIAGNOSIS — I87303 Chronic venous hypertension (idiopathic) without complications of bilateral lower extremity: Secondary | ICD-10-CM | POA: Diagnosis not present

## 2020-10-20 DIAGNOSIS — G4733 Obstructive sleep apnea (adult) (pediatric): Secondary | ICD-10-CM | POA: Diagnosis not present

## 2020-10-22 ENCOUNTER — Ambulatory Visit (INDEPENDENT_AMBULATORY_CARE_PROVIDER_SITE_OTHER): Payer: Medicare HMO | Admitting: Psychiatry

## 2020-10-22 ENCOUNTER — Other Ambulatory Visit: Payer: Self-pay

## 2020-10-22 DIAGNOSIS — F4320 Adjustment disorder, unspecified: Secondary | ICD-10-CM

## 2020-10-22 DIAGNOSIS — F331 Major depressive disorder, recurrent, moderate: Secondary | ICD-10-CM

## 2020-10-22 DIAGNOSIS — Z634 Disappearance and death of family member: Secondary | ICD-10-CM | POA: Diagnosis not present

## 2020-10-22 DIAGNOSIS — Z8673 Personal history of transient ischemic attack (TIA), and cerebral infarction without residual deficits: Secondary | ICD-10-CM

## 2020-10-22 NOTE — Progress Notes (Signed)
Psychotherapy Progress Note [facility] Marliss Czar, PhD LP  Patient ID: Dakota Hall     MRN: [id number] Therapy format: Individual psychotherapy Date: 10/22/2020      Start: 11:20a     Stop: 12:05p     Time Spent: 45 min Location: In-person   Session narrative (presenting needs, interim history, self-report of stressors and symptoms, applications of prior therapy, status changes, and interventions made in session) First anniv of Dakota Hall Nov 30, kids and grandkids came over.  Did take an arrangement to the grave, have planned to go back for Christmas decoration.  Has physical therapy and nurse coming, starting today, after personally requesting from his doctor.  New caregiver, Dakota Hall, who is big and strong enough to help with showers.  Three days a week.  Nurse 2/wk, PT 2/wk as scheduled.  Know Dakota clothes sit upstairs, awaiting the kids to work through.  Could use some things put away downstairs, as he lives entirely there.  Things are laid out in the living room, removed from closet turned into a shower, also depending on kids to work through.  He has filing to do.  Discussed possibility of putting in 10 minutes just to accomplish something.    Season will have a couple times of kids/grandkids coming over.  Has had a couple people from church call, which helps.  Wishes family could come over more.  Has not called neighbors Dakota Hall and Dakota Hall, still could.  Tues/Thurs tends to lay in the bed till 10am, when no one is coming, but hopes he can program services those days, too.  Typical 10 pm bedtime.  Gets up 9am on programmed days.  Discussed possibilities of receiving or giving phone support calls, either through [employer] or his church.  Called neighbors on Saturday, when he had trouble getting into chair.  They are elderly, so required calling fire department.  Gets some meals from them.    Got through condolence cards from last year -- sad, but an  accomplishment.  Kids had been after him to do it.  Would like to ride round, look at Christmas lights.  Pledges to bring it back up.  Caregiver relationship forming.  Appreciates Dakota Hall asking if he wants to do things before just doing them.  Dakota Hall "spoiled" him by doing things more autonomously.    Therapeutic modalities: Cognitive Behavioral Therapy and Solution-Oriented/Positive Psychology  Mental Status/Observations:  Appearance:   Casual     Behavior:  Appropriate  Motor:  w/c, hemiplegia  Speech/Language:   reasonably clear, mild CVA-related slurring  Affect:  Appropriate, more responsive to subject  Mood:  depressed  Thought process:  normal  Thought content:    WNL  Sensory/Perceptual disturbances:    WNL  Orientation:  Fully oriented  Attention:  Good    Concentration:  Good  Memory:  WNL  Insight:    Good  Judgment:   Good  Impulse Control:  Fair   Risk Assessment: Danger to Self: No Self-injurious Behavior: No Danger to Others: No Physical Aggression / Violence: No Duty to Warn: No Access to Firearms a concern: No  Assessment of progress:  progressing  Diagnosis:   ICD-10-CM   1. Bereavement reaction  F43.20    Z63.4   2. Major depressive disorder, recurrent episode, moderate (HCC)  F33.1   3. History of stroke  Z86.73    Plan:  . Will try to schedule Tues/Thurs to support 9am rising . Will put it 10 minutes  of filing, see how it goes . Will get in touch with Dakota Hall and Dakota Hall to invite over . Will ask kids about nailing down time to see lights, go through clothes, pick up things in living room to put away . Recommend Dakota Hall continue asking rather than assuming what needs doing, to keep caregiving rehabilitation-minded . Were welcome, approach difficult things with an eye toward how Dakota Hall might be proud of him . Other recommendations/advice as may be noted above . Continue to utilize previously learned skills ad lib . Maintain medication as prescribed and  work faithfully with relevant prescriber(s) if any changes are desired or seem indicated . Call the clinic on-call service, present to ER, or call 911 if any life-threatening psychiatric crisis Return in about 1 month (around 11/22/2020) for time as available. . Already scheduled visit in this office 03/27/2021.  Robley Fries, PhD Marliss Czar, PhD LP Clinical Psychologist, [facility] [address], [address] [place], [place] [address] [phone]

## 2020-10-23 DIAGNOSIS — G4733 Obstructive sleep apnea (adult) (pediatric): Secondary | ICD-10-CM | POA: Diagnosis not present

## 2020-10-23 DIAGNOSIS — L89152 Pressure ulcer of sacral region, stage 2: Secondary | ICD-10-CM | POA: Diagnosis not present

## 2020-10-23 DIAGNOSIS — E1151 Type 2 diabetes mellitus with diabetic peripheral angiopathy without gangrene: Secondary | ICD-10-CM | POA: Diagnosis not present

## 2020-10-23 DIAGNOSIS — I87303 Chronic venous hypertension (idiopathic) without complications of bilateral lower extremity: Secondary | ICD-10-CM | POA: Diagnosis not present

## 2020-10-23 DIAGNOSIS — I69322 Dysarthria following cerebral infarction: Secondary | ICD-10-CM | POA: Diagnosis not present

## 2020-10-23 DIAGNOSIS — I69351 Hemiplegia and hemiparesis following cerebral infarction affecting right dominant side: Secondary | ICD-10-CM | POA: Diagnosis not present

## 2020-10-23 DIAGNOSIS — M1830 Unilateral post-traumatic osteoarthritis of first carpometacarpal joint, unspecified hand: Secondary | ICD-10-CM | POA: Diagnosis not present

## 2020-10-23 DIAGNOSIS — I129 Hypertensive chronic kidney disease with stage 1 through stage 4 chronic kidney disease, or unspecified chronic kidney disease: Secondary | ICD-10-CM | POA: Diagnosis not present

## 2020-10-23 DIAGNOSIS — E1122 Type 2 diabetes mellitus with diabetic chronic kidney disease: Secondary | ICD-10-CM | POA: Diagnosis not present

## 2020-10-25 DIAGNOSIS — I69322 Dysarthria following cerebral infarction: Secondary | ICD-10-CM | POA: Diagnosis not present

## 2020-10-25 DIAGNOSIS — I129 Hypertensive chronic kidney disease with stage 1 through stage 4 chronic kidney disease, or unspecified chronic kidney disease: Secondary | ICD-10-CM | POA: Diagnosis not present

## 2020-10-25 DIAGNOSIS — G4733 Obstructive sleep apnea (adult) (pediatric): Secondary | ICD-10-CM | POA: Diagnosis not present

## 2020-10-25 DIAGNOSIS — I87303 Chronic venous hypertension (idiopathic) without complications of bilateral lower extremity: Secondary | ICD-10-CM | POA: Diagnosis not present

## 2020-10-25 DIAGNOSIS — M1830 Unilateral post-traumatic osteoarthritis of first carpometacarpal joint, unspecified hand: Secondary | ICD-10-CM | POA: Diagnosis not present

## 2020-10-25 DIAGNOSIS — I69351 Hemiplegia and hemiparesis following cerebral infarction affecting right dominant side: Secondary | ICD-10-CM | POA: Diagnosis not present

## 2020-10-25 DIAGNOSIS — E1151 Type 2 diabetes mellitus with diabetic peripheral angiopathy without gangrene: Secondary | ICD-10-CM | POA: Diagnosis not present

## 2020-10-25 DIAGNOSIS — E1122 Type 2 diabetes mellitus with diabetic chronic kidney disease: Secondary | ICD-10-CM | POA: Diagnosis not present

## 2020-10-25 DIAGNOSIS — L89152 Pressure ulcer of sacral region, stage 2: Secondary | ICD-10-CM | POA: Diagnosis not present

## 2020-10-26 DIAGNOSIS — G4733 Obstructive sleep apnea (adult) (pediatric): Secondary | ICD-10-CM | POA: Diagnosis not present

## 2020-10-26 DIAGNOSIS — E1122 Type 2 diabetes mellitus with diabetic chronic kidney disease: Secondary | ICD-10-CM | POA: Diagnosis not present

## 2020-10-26 DIAGNOSIS — I69322 Dysarthria following cerebral infarction: Secondary | ICD-10-CM | POA: Diagnosis not present

## 2020-10-26 DIAGNOSIS — I10 Essential (primary) hypertension: Secondary | ICD-10-CM | POA: Diagnosis not present

## 2020-10-26 DIAGNOSIS — M1830 Unilateral post-traumatic osteoarthritis of first carpometacarpal joint, unspecified hand: Secondary | ICD-10-CM | POA: Diagnosis not present

## 2020-10-26 DIAGNOSIS — I87303 Chronic venous hypertension (idiopathic) without complications of bilateral lower extremity: Secondary | ICD-10-CM | POA: Diagnosis not present

## 2020-10-26 DIAGNOSIS — N183 Chronic kidney disease, stage 3 unspecified: Secondary | ICD-10-CM | POA: Diagnosis not present

## 2020-10-26 DIAGNOSIS — I129 Hypertensive chronic kidney disease with stage 1 through stage 4 chronic kidney disease, or unspecified chronic kidney disease: Secondary | ICD-10-CM | POA: Diagnosis not present

## 2020-10-26 DIAGNOSIS — E1169 Type 2 diabetes mellitus with other specified complication: Secondary | ICD-10-CM | POA: Diagnosis not present

## 2020-10-26 DIAGNOSIS — F3341 Major depressive disorder, recurrent, in partial remission: Secondary | ICD-10-CM | POA: Diagnosis not present

## 2020-10-26 DIAGNOSIS — G4700 Insomnia, unspecified: Secondary | ICD-10-CM | POA: Diagnosis not present

## 2020-10-26 DIAGNOSIS — L89152 Pressure ulcer of sacral region, stage 2: Secondary | ICD-10-CM | POA: Diagnosis not present

## 2020-10-26 DIAGNOSIS — E785 Hyperlipidemia, unspecified: Secondary | ICD-10-CM | POA: Diagnosis not present

## 2020-10-26 DIAGNOSIS — I69351 Hemiplegia and hemiparesis following cerebral infarction affecting right dominant side: Secondary | ICD-10-CM | POA: Diagnosis not present

## 2020-10-26 DIAGNOSIS — E1151 Type 2 diabetes mellitus with diabetic peripheral angiopathy without gangrene: Secondary | ICD-10-CM | POA: Diagnosis not present

## 2020-10-30 DIAGNOSIS — L89152 Pressure ulcer of sacral region, stage 2: Secondary | ICD-10-CM | POA: Diagnosis not present

## 2020-10-30 DIAGNOSIS — G4733 Obstructive sleep apnea (adult) (pediatric): Secondary | ICD-10-CM | POA: Diagnosis not present

## 2020-10-30 DIAGNOSIS — I87303 Chronic venous hypertension (idiopathic) without complications of bilateral lower extremity: Secondary | ICD-10-CM | POA: Diagnosis not present

## 2020-10-30 DIAGNOSIS — M1830 Unilateral post-traumatic osteoarthritis of first carpometacarpal joint, unspecified hand: Secondary | ICD-10-CM | POA: Diagnosis not present

## 2020-10-30 DIAGNOSIS — E1151 Type 2 diabetes mellitus with diabetic peripheral angiopathy without gangrene: Secondary | ICD-10-CM | POA: Diagnosis not present

## 2020-10-30 DIAGNOSIS — E1122 Type 2 diabetes mellitus with diabetic chronic kidney disease: Secondary | ICD-10-CM | POA: Diagnosis not present

## 2020-10-30 DIAGNOSIS — I69351 Hemiplegia and hemiparesis following cerebral infarction affecting right dominant side: Secondary | ICD-10-CM | POA: Diagnosis not present

## 2020-10-30 DIAGNOSIS — I69322 Dysarthria following cerebral infarction: Secondary | ICD-10-CM | POA: Diagnosis not present

## 2020-10-30 DIAGNOSIS — I129 Hypertensive chronic kidney disease with stage 1 through stage 4 chronic kidney disease, or unspecified chronic kidney disease: Secondary | ICD-10-CM | POA: Diagnosis not present

## 2020-11-02 DIAGNOSIS — M1830 Unilateral post-traumatic osteoarthritis of first carpometacarpal joint, unspecified hand: Secondary | ICD-10-CM | POA: Diagnosis not present

## 2020-11-02 DIAGNOSIS — I69351 Hemiplegia and hemiparesis following cerebral infarction affecting right dominant side: Secondary | ICD-10-CM | POA: Diagnosis not present

## 2020-11-02 DIAGNOSIS — I69322 Dysarthria following cerebral infarction: Secondary | ICD-10-CM | POA: Diagnosis not present

## 2020-11-02 DIAGNOSIS — L89152 Pressure ulcer of sacral region, stage 2: Secondary | ICD-10-CM | POA: Diagnosis not present

## 2020-11-02 DIAGNOSIS — I87303 Chronic venous hypertension (idiopathic) without complications of bilateral lower extremity: Secondary | ICD-10-CM | POA: Diagnosis not present

## 2020-11-02 DIAGNOSIS — E1151 Type 2 diabetes mellitus with diabetic peripheral angiopathy without gangrene: Secondary | ICD-10-CM | POA: Diagnosis not present

## 2020-11-02 DIAGNOSIS — E1122 Type 2 diabetes mellitus with diabetic chronic kidney disease: Secondary | ICD-10-CM | POA: Diagnosis not present

## 2020-11-02 DIAGNOSIS — G4733 Obstructive sleep apnea (adult) (pediatric): Secondary | ICD-10-CM | POA: Diagnosis not present

## 2020-11-02 DIAGNOSIS — I129 Hypertensive chronic kidney disease with stage 1 through stage 4 chronic kidney disease, or unspecified chronic kidney disease: Secondary | ICD-10-CM | POA: Diagnosis not present

## 2020-11-05 DIAGNOSIS — I129 Hypertensive chronic kidney disease with stage 1 through stage 4 chronic kidney disease, or unspecified chronic kidney disease: Secondary | ICD-10-CM | POA: Diagnosis not present

## 2020-11-05 DIAGNOSIS — G4733 Obstructive sleep apnea (adult) (pediatric): Secondary | ICD-10-CM | POA: Diagnosis not present

## 2020-11-05 DIAGNOSIS — I69322 Dysarthria following cerebral infarction: Secondary | ICD-10-CM | POA: Diagnosis not present

## 2020-11-05 DIAGNOSIS — E1122 Type 2 diabetes mellitus with diabetic chronic kidney disease: Secondary | ICD-10-CM | POA: Diagnosis not present

## 2020-11-05 DIAGNOSIS — I87303 Chronic venous hypertension (idiopathic) without complications of bilateral lower extremity: Secondary | ICD-10-CM | POA: Diagnosis not present

## 2020-11-05 DIAGNOSIS — E1151 Type 2 diabetes mellitus with diabetic peripheral angiopathy without gangrene: Secondary | ICD-10-CM | POA: Diagnosis not present

## 2020-11-05 DIAGNOSIS — I69351 Hemiplegia and hemiparesis following cerebral infarction affecting right dominant side: Secondary | ICD-10-CM | POA: Diagnosis not present

## 2020-11-05 DIAGNOSIS — L89152 Pressure ulcer of sacral region, stage 2: Secondary | ICD-10-CM | POA: Diagnosis not present

## 2020-11-05 DIAGNOSIS — M1830 Unilateral post-traumatic osteoarthritis of first carpometacarpal joint, unspecified hand: Secondary | ICD-10-CM | POA: Diagnosis not present

## 2020-11-07 DIAGNOSIS — I69351 Hemiplegia and hemiparesis following cerebral infarction affecting right dominant side: Secondary | ICD-10-CM | POA: Diagnosis not present

## 2020-11-07 DIAGNOSIS — E1122 Type 2 diabetes mellitus with diabetic chronic kidney disease: Secondary | ICD-10-CM | POA: Diagnosis not present

## 2020-11-07 DIAGNOSIS — G4733 Obstructive sleep apnea (adult) (pediatric): Secondary | ICD-10-CM | POA: Diagnosis not present

## 2020-11-07 DIAGNOSIS — I69322 Dysarthria following cerebral infarction: Secondary | ICD-10-CM | POA: Diagnosis not present

## 2020-11-07 DIAGNOSIS — I87303 Chronic venous hypertension (idiopathic) without complications of bilateral lower extremity: Secondary | ICD-10-CM | POA: Diagnosis not present

## 2020-11-07 DIAGNOSIS — I129 Hypertensive chronic kidney disease with stage 1 through stage 4 chronic kidney disease, or unspecified chronic kidney disease: Secondary | ICD-10-CM | POA: Diagnosis not present

## 2020-11-07 DIAGNOSIS — M1830 Unilateral post-traumatic osteoarthritis of first carpometacarpal joint, unspecified hand: Secondary | ICD-10-CM | POA: Diagnosis not present

## 2020-11-07 DIAGNOSIS — L89152 Pressure ulcer of sacral region, stage 2: Secondary | ICD-10-CM | POA: Diagnosis not present

## 2020-11-07 DIAGNOSIS — E1151 Type 2 diabetes mellitus with diabetic peripheral angiopathy without gangrene: Secondary | ICD-10-CM | POA: Diagnosis not present

## 2020-11-12 DIAGNOSIS — I129 Hypertensive chronic kidney disease with stage 1 through stage 4 chronic kidney disease, or unspecified chronic kidney disease: Secondary | ICD-10-CM | POA: Diagnosis not present

## 2020-11-12 DIAGNOSIS — L89152 Pressure ulcer of sacral region, stage 2: Secondary | ICD-10-CM | POA: Diagnosis not present

## 2020-11-12 DIAGNOSIS — G4733 Obstructive sleep apnea (adult) (pediatric): Secondary | ICD-10-CM | POA: Diagnosis not present

## 2020-11-12 DIAGNOSIS — I69351 Hemiplegia and hemiparesis following cerebral infarction affecting right dominant side: Secondary | ICD-10-CM | POA: Diagnosis not present

## 2020-11-12 DIAGNOSIS — I69322 Dysarthria following cerebral infarction: Secondary | ICD-10-CM | POA: Diagnosis not present

## 2020-11-12 DIAGNOSIS — E1151 Type 2 diabetes mellitus with diabetic peripheral angiopathy without gangrene: Secondary | ICD-10-CM | POA: Diagnosis not present

## 2020-11-12 DIAGNOSIS — E1122 Type 2 diabetes mellitus with diabetic chronic kidney disease: Secondary | ICD-10-CM | POA: Diagnosis not present

## 2020-11-12 DIAGNOSIS — I87303 Chronic venous hypertension (idiopathic) without complications of bilateral lower extremity: Secondary | ICD-10-CM | POA: Diagnosis not present

## 2020-11-12 DIAGNOSIS — M1830 Unilateral post-traumatic osteoarthritis of first carpometacarpal joint, unspecified hand: Secondary | ICD-10-CM | POA: Diagnosis not present

## 2020-11-16 DIAGNOSIS — I129 Hypertensive chronic kidney disease with stage 1 through stage 4 chronic kidney disease, or unspecified chronic kidney disease: Secondary | ICD-10-CM | POA: Diagnosis not present

## 2020-11-16 DIAGNOSIS — G4733 Obstructive sleep apnea (adult) (pediatric): Secondary | ICD-10-CM | POA: Diagnosis not present

## 2020-11-16 DIAGNOSIS — I69351 Hemiplegia and hemiparesis following cerebral infarction affecting right dominant side: Secondary | ICD-10-CM | POA: Diagnosis not present

## 2020-11-16 DIAGNOSIS — E1122 Type 2 diabetes mellitus with diabetic chronic kidney disease: Secondary | ICD-10-CM | POA: Diagnosis not present

## 2020-11-16 DIAGNOSIS — L89152 Pressure ulcer of sacral region, stage 2: Secondary | ICD-10-CM | POA: Diagnosis not present

## 2020-11-16 DIAGNOSIS — I87303 Chronic venous hypertension (idiopathic) without complications of bilateral lower extremity: Secondary | ICD-10-CM | POA: Diagnosis not present

## 2020-11-16 DIAGNOSIS — E1151 Type 2 diabetes mellitus with diabetic peripheral angiopathy without gangrene: Secondary | ICD-10-CM | POA: Diagnosis not present

## 2020-11-16 DIAGNOSIS — I69322 Dysarthria following cerebral infarction: Secondary | ICD-10-CM | POA: Diagnosis not present

## 2020-11-16 DIAGNOSIS — M1830 Unilateral post-traumatic osteoarthritis of first carpometacarpal joint, unspecified hand: Secondary | ICD-10-CM | POA: Diagnosis not present

## 2020-11-21 DIAGNOSIS — G4733 Obstructive sleep apnea (adult) (pediatric): Secondary | ICD-10-CM | POA: Diagnosis not present

## 2020-11-21 DIAGNOSIS — E1122 Type 2 diabetes mellitus with diabetic chronic kidney disease: Secondary | ICD-10-CM | POA: Diagnosis not present

## 2020-11-21 DIAGNOSIS — I69351 Hemiplegia and hemiparesis following cerebral infarction affecting right dominant side: Secondary | ICD-10-CM | POA: Diagnosis not present

## 2020-11-21 DIAGNOSIS — L89152 Pressure ulcer of sacral region, stage 2: Secondary | ICD-10-CM | POA: Diagnosis not present

## 2020-11-21 DIAGNOSIS — E1151 Type 2 diabetes mellitus with diabetic peripheral angiopathy without gangrene: Secondary | ICD-10-CM | POA: Diagnosis not present

## 2020-11-21 DIAGNOSIS — I69322 Dysarthria following cerebral infarction: Secondary | ICD-10-CM | POA: Diagnosis not present

## 2020-11-21 DIAGNOSIS — M1830 Unilateral post-traumatic osteoarthritis of first carpometacarpal joint, unspecified hand: Secondary | ICD-10-CM | POA: Diagnosis not present

## 2020-11-21 DIAGNOSIS — I87303 Chronic venous hypertension (idiopathic) without complications of bilateral lower extremity: Secondary | ICD-10-CM | POA: Diagnosis not present

## 2020-11-21 DIAGNOSIS — I129 Hypertensive chronic kidney disease with stage 1 through stage 4 chronic kidney disease, or unspecified chronic kidney disease: Secondary | ICD-10-CM | POA: Diagnosis not present

## 2020-11-23 DIAGNOSIS — I129 Hypertensive chronic kidney disease with stage 1 through stage 4 chronic kidney disease, or unspecified chronic kidney disease: Secondary | ICD-10-CM | POA: Diagnosis not present

## 2020-11-23 DIAGNOSIS — E1122 Type 2 diabetes mellitus with diabetic chronic kidney disease: Secondary | ICD-10-CM | POA: Diagnosis not present

## 2020-11-23 DIAGNOSIS — M1830 Unilateral post-traumatic osteoarthritis of first carpometacarpal joint, unspecified hand: Secondary | ICD-10-CM | POA: Diagnosis not present

## 2020-11-23 DIAGNOSIS — I69351 Hemiplegia and hemiparesis following cerebral infarction affecting right dominant side: Secondary | ICD-10-CM | POA: Diagnosis not present

## 2020-11-23 DIAGNOSIS — I69322 Dysarthria following cerebral infarction: Secondary | ICD-10-CM | POA: Diagnosis not present

## 2020-11-23 DIAGNOSIS — E1151 Type 2 diabetes mellitus with diabetic peripheral angiopathy without gangrene: Secondary | ICD-10-CM | POA: Diagnosis not present

## 2020-11-23 DIAGNOSIS — G4733 Obstructive sleep apnea (adult) (pediatric): Secondary | ICD-10-CM | POA: Diagnosis not present

## 2020-11-23 DIAGNOSIS — I87303 Chronic venous hypertension (idiopathic) without complications of bilateral lower extremity: Secondary | ICD-10-CM | POA: Diagnosis not present

## 2020-11-23 DIAGNOSIS — L89152 Pressure ulcer of sacral region, stage 2: Secondary | ICD-10-CM | POA: Diagnosis not present

## 2020-11-26 DIAGNOSIS — E1169 Type 2 diabetes mellitus with other specified complication: Secondary | ICD-10-CM | POA: Diagnosis not present

## 2020-11-26 DIAGNOSIS — E785 Hyperlipidemia, unspecified: Secondary | ICD-10-CM | POA: Diagnosis not present

## 2020-11-26 DIAGNOSIS — I87303 Chronic venous hypertension (idiopathic) without complications of bilateral lower extremity: Secondary | ICD-10-CM | POA: Diagnosis not present

## 2020-11-26 DIAGNOSIS — N183 Chronic kidney disease, stage 3 unspecified: Secondary | ICD-10-CM | POA: Diagnosis not present

## 2020-11-26 DIAGNOSIS — I129 Hypertensive chronic kidney disease with stage 1 through stage 4 chronic kidney disease, or unspecified chronic kidney disease: Secondary | ICD-10-CM | POA: Diagnosis not present

## 2020-11-26 DIAGNOSIS — I69322 Dysarthria following cerebral infarction: Secondary | ICD-10-CM | POA: Diagnosis not present

## 2020-11-26 DIAGNOSIS — L89152 Pressure ulcer of sacral region, stage 2: Secondary | ICD-10-CM | POA: Diagnosis not present

## 2020-11-26 DIAGNOSIS — G4733 Obstructive sleep apnea (adult) (pediatric): Secondary | ICD-10-CM | POA: Diagnosis not present

## 2020-11-26 DIAGNOSIS — I69351 Hemiplegia and hemiparesis following cerebral infarction affecting right dominant side: Secondary | ICD-10-CM | POA: Diagnosis not present

## 2020-11-26 DIAGNOSIS — M1830 Unilateral post-traumatic osteoarthritis of first carpometacarpal joint, unspecified hand: Secondary | ICD-10-CM | POA: Diagnosis not present

## 2020-11-26 DIAGNOSIS — E1122 Type 2 diabetes mellitus with diabetic chronic kidney disease: Secondary | ICD-10-CM | POA: Diagnosis not present

## 2020-11-26 DIAGNOSIS — I10 Essential (primary) hypertension: Secondary | ICD-10-CM | POA: Diagnosis not present

## 2020-11-26 DIAGNOSIS — E1151 Type 2 diabetes mellitus with diabetic peripheral angiopathy without gangrene: Secondary | ICD-10-CM | POA: Diagnosis not present

## 2020-11-26 DIAGNOSIS — F3341 Major depressive disorder, recurrent, in partial remission: Secondary | ICD-10-CM | POA: Diagnosis not present

## 2020-11-26 DIAGNOSIS — G4700 Insomnia, unspecified: Secondary | ICD-10-CM | POA: Diagnosis not present

## 2020-11-29 DIAGNOSIS — L89152 Pressure ulcer of sacral region, stage 2: Secondary | ICD-10-CM | POA: Diagnosis not present

## 2020-11-29 DIAGNOSIS — I69351 Hemiplegia and hemiparesis following cerebral infarction affecting right dominant side: Secondary | ICD-10-CM | POA: Diagnosis not present

## 2020-11-29 DIAGNOSIS — M1830 Unilateral post-traumatic osteoarthritis of first carpometacarpal joint, unspecified hand: Secondary | ICD-10-CM | POA: Diagnosis not present

## 2020-11-29 DIAGNOSIS — I69322 Dysarthria following cerebral infarction: Secondary | ICD-10-CM | POA: Diagnosis not present

## 2020-11-29 DIAGNOSIS — I129 Hypertensive chronic kidney disease with stage 1 through stage 4 chronic kidney disease, or unspecified chronic kidney disease: Secondary | ICD-10-CM | POA: Diagnosis not present

## 2020-11-29 DIAGNOSIS — E1151 Type 2 diabetes mellitus with diabetic peripheral angiopathy without gangrene: Secondary | ICD-10-CM | POA: Diagnosis not present

## 2020-11-29 DIAGNOSIS — E1122 Type 2 diabetes mellitus with diabetic chronic kidney disease: Secondary | ICD-10-CM | POA: Diagnosis not present

## 2020-11-29 DIAGNOSIS — G4733 Obstructive sleep apnea (adult) (pediatric): Secondary | ICD-10-CM | POA: Diagnosis not present

## 2020-11-29 DIAGNOSIS — I87303 Chronic venous hypertension (idiopathic) without complications of bilateral lower extremity: Secondary | ICD-10-CM | POA: Diagnosis not present

## 2020-11-30 ENCOUNTER — Other Ambulatory Visit: Payer: Self-pay

## 2020-11-30 ENCOUNTER — Ambulatory Visit (INDEPENDENT_AMBULATORY_CARE_PROVIDER_SITE_OTHER): Payer: Medicare HMO | Admitting: Psychiatry

## 2020-11-30 DIAGNOSIS — Z8673 Personal history of transient ischemic attack (TIA), and cerebral infarction without residual deficits: Secondary | ICD-10-CM

## 2020-11-30 DIAGNOSIS — Z634 Disappearance and death of family member: Secondary | ICD-10-CM | POA: Diagnosis not present

## 2020-11-30 DIAGNOSIS — F4320 Adjustment disorder, unspecified: Secondary | ICD-10-CM | POA: Diagnosis not present

## 2020-11-30 DIAGNOSIS — F331 Major depressive disorder, recurrent, moderate: Secondary | ICD-10-CM | POA: Diagnosis not present

## 2020-11-30 NOTE — Progress Notes (Signed)
Psychotherapy Progress Note [facility] Marliss Czar, PhD LP  Patient ID: Dakota Hall     MRN: [id number] Therapy format: Individual psychotherapy Date: 11/30/2020      Start: 11:15a     Stop: 11:55a     Time Spent: 40 min Location: In-person   Session narrative (presenting needs, interim history, self-report of stressors and symptoms, applications of prior therapy, status changes, and interventions made in session) Kids and grandkids came for Christmas, enjoyed.  5th grandchild due in March.  Doing some better with reminders of [name], more accepting of her passing.  Maybe not sleeping as much, watching some TV, e.g., mysteries, game shows, [employer].  Bedding down around 10pm, after the news, quality sleep, wakes up about 8:30am, out of bed around 9:30am.  Caregiver/noncaregiver days rising closer to the same time.  Continues to receive meals from neighbors a couple times a week and interact when they bring his mail.  Has called Becky & [name].  Some concern for Becky, who is recovering from complications of TKR.    PT still comes to the house -- 6 different therapists, owing to loss in regular PT's family.  Got 10 nervous steps with a walker.  First time in 6-8 months.  Building some confidence.  Has TID regimen of leg exercises trying to recover function and strength esp. right leg.    Decided not to pursue phone support or volunteering.  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  Today first day of new caregiver, male, can't recall her name yet so far.  Hasn't seen enough to gauge her yet, but hopes she can carry off certain tasks.  Still awkward to get baths, but getting better at tolerating.    Has not been through [name]'s things upstairs, but plans to go through kitchen with daughter to purge excess food (canned goods, spices that won't get used).  Would like to see himself do more for himself in the way of dishes and laundry.  Dorene Sorrow  has made sure to involve him in parts of tasks to retain independence, grateful for it, and has that expectation set with new caregiver.  Family do OK with him, no complaints, though they are more apt to do things for him rather than with him.    Overall, mood running 5-8 / 10 by self-report.  No particular interest/need to declutter.  No interest in phone support.  Would like to get through the kitchen clearing, then dining room.  Affirmed and encouraged.  Next med check May 11.    Therapeutic modalities: Cognitive Behavioral Therapy, Solution-Oriented/Positive Psychology and Ego-Supportive  Mental Status/Observations:  Appearance:   Casual     Behavior:  Appropriate  Motor:  wheelchair  Speech/Language:   affected by stroke  Affect:  Depressed  Mood:  depressed  Thought process:  concrete  Thought content:    ruminating  Sensory/Perceptual disturbances:    WNL  Orientation:  Fully oriented  Attention:  Good    Concentration:  Good  Memory:  WNL  Insight:    Good  Judgment:   Good  Impulse Control:  Good   Risk Assessment: Danger to Self: No Self-injurious Behavior: No Danger to Others: No Physical Aggression / Violence: No Duty to Warn: No Access to Firearms a concern: No  Assessment of progress:  stabilized  Diagnosis:   ICD-10-CM   1. Major depressive disorder, recurrent episode, moderate (HCC)  F33.1   2.  Bereavement reaction  F43.20    Z63.4   3. History of stroke  Z86.73    Plan:  . Maintain sleep and nutrition . Reach out to neighbors and friends further, possibly ask church about further outreach . Put in some time going through [name]'s things . Make repeated, small efforts to declutter in prominent areas . Other recommendations/advice as may be noted above . Continue to utilize previously learned skills ad lib . Maintain medication as prescribed and work faithfully with relevant prescriber(s) if any changes are desired or seem indicated . Call the clinic  on-call service, present to ER, or call 911 if any life-threatening psychiatric crisis Return in about 1 month (around 12/31/2020) for time as available. . Already scheduled visit in this office 03/27/2021.  Robley Fries, PhD Marliss Czar, PhD LP Clinical Psychologist, [facility] [address], [address] [place], [place] [address] [phone]

## 2020-12-06 DIAGNOSIS — I69351 Hemiplegia and hemiparesis following cerebral infarction affecting right dominant side: Secondary | ICD-10-CM | POA: Diagnosis not present

## 2020-12-06 DIAGNOSIS — E1122 Type 2 diabetes mellitus with diabetic chronic kidney disease: Secondary | ICD-10-CM | POA: Diagnosis not present

## 2020-12-06 DIAGNOSIS — M1830 Unilateral post-traumatic osteoarthritis of first carpometacarpal joint, unspecified hand: Secondary | ICD-10-CM | POA: Diagnosis not present

## 2020-12-06 DIAGNOSIS — L89152 Pressure ulcer of sacral region, stage 2: Secondary | ICD-10-CM | POA: Diagnosis not present

## 2020-12-06 DIAGNOSIS — G4733 Obstructive sleep apnea (adult) (pediatric): Secondary | ICD-10-CM | POA: Diagnosis not present

## 2020-12-06 DIAGNOSIS — I129 Hypertensive chronic kidney disease with stage 1 through stage 4 chronic kidney disease, or unspecified chronic kidney disease: Secondary | ICD-10-CM | POA: Diagnosis not present

## 2020-12-06 DIAGNOSIS — I69322 Dysarthria following cerebral infarction: Secondary | ICD-10-CM | POA: Diagnosis not present

## 2020-12-06 DIAGNOSIS — I87303 Chronic venous hypertension (idiopathic) without complications of bilateral lower extremity: Secondary | ICD-10-CM | POA: Diagnosis not present

## 2020-12-06 DIAGNOSIS — E1151 Type 2 diabetes mellitus with diabetic peripheral angiopathy without gangrene: Secondary | ICD-10-CM | POA: Diagnosis not present

## 2020-12-13 DIAGNOSIS — I69322 Dysarthria following cerebral infarction: Secondary | ICD-10-CM | POA: Diagnosis not present

## 2020-12-13 DIAGNOSIS — I69351 Hemiplegia and hemiparesis following cerebral infarction affecting right dominant side: Secondary | ICD-10-CM | POA: Diagnosis not present

## 2020-12-13 DIAGNOSIS — E1151 Type 2 diabetes mellitus with diabetic peripheral angiopathy without gangrene: Secondary | ICD-10-CM | POA: Diagnosis not present

## 2020-12-13 DIAGNOSIS — L89152 Pressure ulcer of sacral region, stage 2: Secondary | ICD-10-CM | POA: Diagnosis not present

## 2020-12-13 DIAGNOSIS — G4733 Obstructive sleep apnea (adult) (pediatric): Secondary | ICD-10-CM | POA: Diagnosis not present

## 2020-12-13 DIAGNOSIS — I129 Hypertensive chronic kidney disease with stage 1 through stage 4 chronic kidney disease, or unspecified chronic kidney disease: Secondary | ICD-10-CM | POA: Diagnosis not present

## 2020-12-13 DIAGNOSIS — E1122 Type 2 diabetes mellitus with diabetic chronic kidney disease: Secondary | ICD-10-CM | POA: Diagnosis not present

## 2020-12-13 DIAGNOSIS — I87303 Chronic venous hypertension (idiopathic) without complications of bilateral lower extremity: Secondary | ICD-10-CM | POA: Diagnosis not present

## 2020-12-13 DIAGNOSIS — M1830 Unilateral post-traumatic osteoarthritis of first carpometacarpal joint, unspecified hand: Secondary | ICD-10-CM | POA: Diagnosis not present

## 2020-12-18 DIAGNOSIS — I69322 Dysarthria following cerebral infarction: Secondary | ICD-10-CM | POA: Diagnosis not present

## 2020-12-18 DIAGNOSIS — E1151 Type 2 diabetes mellitus with diabetic peripheral angiopathy without gangrene: Secondary | ICD-10-CM | POA: Diagnosis not present

## 2020-12-18 DIAGNOSIS — I69351 Hemiplegia and hemiparesis following cerebral infarction affecting right dominant side: Secondary | ICD-10-CM | POA: Diagnosis not present

## 2020-12-18 DIAGNOSIS — L89321 Pressure ulcer of left buttock, stage 1: Secondary | ICD-10-CM | POA: Diagnosis not present

## 2020-12-18 DIAGNOSIS — I87303 Chronic venous hypertension (idiopathic) without complications of bilateral lower extremity: Secondary | ICD-10-CM | POA: Diagnosis not present

## 2020-12-18 DIAGNOSIS — E1122 Type 2 diabetes mellitus with diabetic chronic kidney disease: Secondary | ICD-10-CM | POA: Diagnosis not present

## 2020-12-18 DIAGNOSIS — L89311 Pressure ulcer of right buttock, stage 1: Secondary | ICD-10-CM | POA: Diagnosis not present

## 2020-12-18 DIAGNOSIS — N183 Chronic kidney disease, stage 3 unspecified: Secondary | ICD-10-CM | POA: Diagnosis not present

## 2020-12-18 DIAGNOSIS — I129 Hypertensive chronic kidney disease with stage 1 through stage 4 chronic kidney disease, or unspecified chronic kidney disease: Secondary | ICD-10-CM | POA: Diagnosis not present

## 2020-12-25 DIAGNOSIS — I129 Hypertensive chronic kidney disease with stage 1 through stage 4 chronic kidney disease, or unspecified chronic kidney disease: Secondary | ICD-10-CM | POA: Diagnosis not present

## 2020-12-25 DIAGNOSIS — L89321 Pressure ulcer of left buttock, stage 1: Secondary | ICD-10-CM | POA: Diagnosis not present

## 2020-12-25 DIAGNOSIS — E1151 Type 2 diabetes mellitus with diabetic peripheral angiopathy without gangrene: Secondary | ICD-10-CM | POA: Diagnosis not present

## 2020-12-25 DIAGNOSIS — L89311 Pressure ulcer of right buttock, stage 1: Secondary | ICD-10-CM | POA: Diagnosis not present

## 2020-12-25 DIAGNOSIS — I69322 Dysarthria following cerebral infarction: Secondary | ICD-10-CM | POA: Diagnosis not present

## 2020-12-25 DIAGNOSIS — E1122 Type 2 diabetes mellitus with diabetic chronic kidney disease: Secondary | ICD-10-CM | POA: Diagnosis not present

## 2020-12-25 DIAGNOSIS — N183 Chronic kidney disease, stage 3 unspecified: Secondary | ICD-10-CM | POA: Diagnosis not present

## 2020-12-25 DIAGNOSIS — I87303 Chronic venous hypertension (idiopathic) without complications of bilateral lower extremity: Secondary | ICD-10-CM | POA: Diagnosis not present

## 2020-12-25 DIAGNOSIS — I69351 Hemiplegia and hemiparesis following cerebral infarction affecting right dominant side: Secondary | ICD-10-CM | POA: Diagnosis not present

## 2020-12-26 DIAGNOSIS — E1122 Type 2 diabetes mellitus with diabetic chronic kidney disease: Secondary | ICD-10-CM | POA: Diagnosis not present

## 2020-12-26 DIAGNOSIS — L89311 Pressure ulcer of right buttock, stage 1: Secondary | ICD-10-CM | POA: Diagnosis not present

## 2020-12-26 DIAGNOSIS — E1151 Type 2 diabetes mellitus with diabetic peripheral angiopathy without gangrene: Secondary | ICD-10-CM | POA: Diagnosis not present

## 2020-12-26 DIAGNOSIS — I87303 Chronic venous hypertension (idiopathic) without complications of bilateral lower extremity: Secondary | ICD-10-CM | POA: Diagnosis not present

## 2020-12-26 DIAGNOSIS — L89321 Pressure ulcer of left buttock, stage 1: Secondary | ICD-10-CM | POA: Diagnosis not present

## 2020-12-26 DIAGNOSIS — I129 Hypertensive chronic kidney disease with stage 1 through stage 4 chronic kidney disease, or unspecified chronic kidney disease: Secondary | ICD-10-CM | POA: Diagnosis not present

## 2020-12-26 DIAGNOSIS — I69322 Dysarthria following cerebral infarction: Secondary | ICD-10-CM | POA: Diagnosis not present

## 2020-12-26 DIAGNOSIS — N183 Chronic kidney disease, stage 3 unspecified: Secondary | ICD-10-CM | POA: Diagnosis not present

## 2020-12-26 DIAGNOSIS — I69351 Hemiplegia and hemiparesis following cerebral infarction affecting right dominant side: Secondary | ICD-10-CM | POA: Diagnosis not present

## 2020-12-28 ENCOUNTER — Other Ambulatory Visit: Payer: Self-pay

## 2020-12-28 ENCOUNTER — Ambulatory Visit (INDEPENDENT_AMBULATORY_CARE_PROVIDER_SITE_OTHER): Payer: Medicare HMO | Admitting: Psychiatry

## 2020-12-28 DIAGNOSIS — F4320 Adjustment disorder, unspecified: Secondary | ICD-10-CM | POA: Diagnosis not present

## 2020-12-28 DIAGNOSIS — E1122 Type 2 diabetes mellitus with diabetic chronic kidney disease: Secondary | ICD-10-CM | POA: Diagnosis not present

## 2020-12-28 DIAGNOSIS — E1169 Type 2 diabetes mellitus with other specified complication: Secondary | ICD-10-CM | POA: Diagnosis not present

## 2020-12-28 DIAGNOSIS — Z634 Disappearance and death of family member: Secondary | ICD-10-CM

## 2020-12-28 DIAGNOSIS — Z8673 Personal history of transient ischemic attack (TIA), and cerebral infarction without residual deficits: Secondary | ICD-10-CM

## 2020-12-28 DIAGNOSIS — I10 Essential (primary) hypertension: Secondary | ICD-10-CM | POA: Diagnosis not present

## 2020-12-28 DIAGNOSIS — F331 Major depressive disorder, recurrent, moderate: Secondary | ICD-10-CM | POA: Diagnosis not present

## 2020-12-28 DIAGNOSIS — F3341 Major depressive disorder, recurrent, in partial remission: Secondary | ICD-10-CM | POA: Diagnosis not present

## 2020-12-28 DIAGNOSIS — N183 Chronic kidney disease, stage 3 unspecified: Secondary | ICD-10-CM | POA: Diagnosis not present

## 2020-12-28 DIAGNOSIS — E785 Hyperlipidemia, unspecified: Secondary | ICD-10-CM | POA: Diagnosis not present

## 2020-12-28 DIAGNOSIS — G4700 Insomnia, unspecified: Secondary | ICD-10-CM | POA: Diagnosis not present

## 2020-12-28 NOTE — Progress Notes (Signed)
Psychotherapy Progress Note [facility] Marliss Czar, PhD LP  Patient ID: Dakota Hall     MRN: [id number] Therapy format: Individual psychotherapy Date: 12/28/2020      Start: 11:20a     Stop: 12:05p     Time Spent: 45 min Location: In-person   Session narrative (presenting needs, interim history, self-report of stressors and symptoms, applications of prior therapy, status changes, and interventions made in session) Middling mood, est 5/10.  Kids come over once a week, share a meal, visit.  Hasn't been out since last visit.  Not interested in going to church, without [name], and dislikes having to depend on caregivers, who rotate and are not consistent at this point.  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  New one, Tabitha, with him today.    [name] "sprung it" on him that they are looking for an assisted living facility.  Daunting to think of leaving his house after 40 years but knows he would like 3 good meals made, and some built-in social stimulation, including worship services handier .  Does need assistance bathing and toileting.  Has never experienced congregate living before except for camp.  Asserts he can adjust to it, be OK with it.  Therapeutic modalities: Cognitive Behavioral Therapy, Solution-Oriented/Positive Psychology, Ego-Supportive and Faith-sensitive  Mental Status/Observations:  Appearance:   Casual     Behavior:  Appropriate  Motor:  w/c  Speech/Language:   cogent, articulation issues from stroke  Affect:  Constricted  Mood:  depressed  Thought process:  normal  Thought content:    WNL and worry  Sensory/Perceptual disturbances:    WNL  Orientation:  Fully oriented  Attention:  Good    Concentration:  Good  Memory:  WNL  Insight:    Good  Judgment:   Good  Impulse Control:  Good   Risk Assessment: Danger to Self: No Self-injurious Behavior: No Danger to Others: No Physical Aggression / Violence:  No Duty to Warn: No Access to Firearms a concern: No  Assessment of progress:  stabilized  Diagnosis:   ICD-10-CM   1. Major depressive disorder, recurrent episode, moderate (HCC)  F33.1   2. Bereavement reaction  F43.20    Z63.4   3. History of stroke  Z86.73    Plan:  . Maintain family contact . Maintain sleep and nutrition . Reach out to neighbors and friends further, possibly ask church about further outreach . Put in some time going through [name]'s things and decluttering in order to make more ready for the move to facility . Other recommendations/advice as may be noted above . Continue to utilize previously learned skills ad lib . Maintain medication as prescribed and work faithfully with relevant prescriber(s) if any changes are desired or seem indicated . Call the clinic on-call service, present to ER, or call 911 if any life-threatening psychiatric crisis Return 4-6 wks, for time as available. . Already scheduled visit in this office 03/27/2021.  Robley Fries, PhD Marliss Czar, PhD LP Clinical Psychologist, [facility] [address] [place], [place] [address] [phone]

## 2021-01-04 DIAGNOSIS — I129 Hypertensive chronic kidney disease with stage 1 through stage 4 chronic kidney disease, or unspecified chronic kidney disease: Secondary | ICD-10-CM | POA: Diagnosis not present

## 2021-01-04 DIAGNOSIS — I87303 Chronic venous hypertension (idiopathic) without complications of bilateral lower extremity: Secondary | ICD-10-CM | POA: Diagnosis not present

## 2021-01-04 DIAGNOSIS — I69351 Hemiplegia and hemiparesis following cerebral infarction affecting right dominant side: Secondary | ICD-10-CM | POA: Diagnosis not present

## 2021-01-04 DIAGNOSIS — L89311 Pressure ulcer of right buttock, stage 1: Secondary | ICD-10-CM | POA: Diagnosis not present

## 2021-01-04 DIAGNOSIS — L89321 Pressure ulcer of left buttock, stage 1: Secondary | ICD-10-CM | POA: Diagnosis not present

## 2021-01-04 DIAGNOSIS — E1151 Type 2 diabetes mellitus with diabetic peripheral angiopathy without gangrene: Secondary | ICD-10-CM | POA: Diagnosis not present

## 2021-01-04 DIAGNOSIS — N183 Chronic kidney disease, stage 3 unspecified: Secondary | ICD-10-CM | POA: Diagnosis not present

## 2021-01-04 DIAGNOSIS — I69322 Dysarthria following cerebral infarction: Secondary | ICD-10-CM | POA: Diagnosis not present

## 2021-01-04 DIAGNOSIS — E1122 Type 2 diabetes mellitus with diabetic chronic kidney disease: Secondary | ICD-10-CM | POA: Diagnosis not present

## 2021-01-08 DIAGNOSIS — I69322 Dysarthria following cerebral infarction: Secondary | ICD-10-CM | POA: Diagnosis not present

## 2021-01-08 DIAGNOSIS — L89311 Pressure ulcer of right buttock, stage 1: Secondary | ICD-10-CM | POA: Diagnosis not present

## 2021-01-08 DIAGNOSIS — N183 Chronic kidney disease, stage 3 unspecified: Secondary | ICD-10-CM | POA: Diagnosis not present

## 2021-01-08 DIAGNOSIS — I69351 Hemiplegia and hemiparesis following cerebral infarction affecting right dominant side: Secondary | ICD-10-CM | POA: Diagnosis not present

## 2021-01-08 DIAGNOSIS — E1151 Type 2 diabetes mellitus with diabetic peripheral angiopathy without gangrene: Secondary | ICD-10-CM | POA: Diagnosis not present

## 2021-01-08 DIAGNOSIS — L89321 Pressure ulcer of left buttock, stage 1: Secondary | ICD-10-CM | POA: Diagnosis not present

## 2021-01-08 DIAGNOSIS — I129 Hypertensive chronic kidney disease with stage 1 through stage 4 chronic kidney disease, or unspecified chronic kidney disease: Secondary | ICD-10-CM | POA: Diagnosis not present

## 2021-01-08 DIAGNOSIS — E1122 Type 2 diabetes mellitus with diabetic chronic kidney disease: Secondary | ICD-10-CM | POA: Diagnosis not present

## 2021-01-08 DIAGNOSIS — I87303 Chronic venous hypertension (idiopathic) without complications of bilateral lower extremity: Secondary | ICD-10-CM | POA: Diagnosis not present

## 2021-01-10 ENCOUNTER — Encounter (HOSPITAL_BASED_OUTPATIENT_CLINIC_OR_DEPARTMENT_OTHER): Payer: Medicare HMO | Admitting: Internal Medicine

## 2021-01-11 DIAGNOSIS — I69322 Dysarthria following cerebral infarction: Secondary | ICD-10-CM | POA: Diagnosis not present

## 2021-01-11 DIAGNOSIS — N183 Chronic kidney disease, stage 3 unspecified: Secondary | ICD-10-CM | POA: Diagnosis not present

## 2021-01-11 DIAGNOSIS — E1122 Type 2 diabetes mellitus with diabetic chronic kidney disease: Secondary | ICD-10-CM | POA: Diagnosis not present

## 2021-01-11 DIAGNOSIS — L89311 Pressure ulcer of right buttock, stage 1: Secondary | ICD-10-CM | POA: Diagnosis not present

## 2021-01-11 DIAGNOSIS — E1151 Type 2 diabetes mellitus with diabetic peripheral angiopathy without gangrene: Secondary | ICD-10-CM | POA: Diagnosis not present

## 2021-01-11 DIAGNOSIS — L89321 Pressure ulcer of left buttock, stage 1: Secondary | ICD-10-CM | POA: Diagnosis not present

## 2021-01-11 DIAGNOSIS — I129 Hypertensive chronic kidney disease with stage 1 through stage 4 chronic kidney disease, or unspecified chronic kidney disease: Secondary | ICD-10-CM | POA: Diagnosis not present

## 2021-01-11 DIAGNOSIS — I69351 Hemiplegia and hemiparesis following cerebral infarction affecting right dominant side: Secondary | ICD-10-CM | POA: Diagnosis not present

## 2021-01-11 DIAGNOSIS — I87303 Chronic venous hypertension (idiopathic) without complications of bilateral lower extremity: Secondary | ICD-10-CM | POA: Diagnosis not present

## 2021-01-15 DIAGNOSIS — I87303 Chronic venous hypertension (idiopathic) without complications of bilateral lower extremity: Secondary | ICD-10-CM | POA: Diagnosis not present

## 2021-01-15 DIAGNOSIS — N183 Chronic kidney disease, stage 3 unspecified: Secondary | ICD-10-CM | POA: Diagnosis not present

## 2021-01-15 DIAGNOSIS — L89321 Pressure ulcer of left buttock, stage 1: Secondary | ICD-10-CM | POA: Diagnosis not present

## 2021-01-15 DIAGNOSIS — I129 Hypertensive chronic kidney disease with stage 1 through stage 4 chronic kidney disease, or unspecified chronic kidney disease: Secondary | ICD-10-CM | POA: Diagnosis not present

## 2021-01-15 DIAGNOSIS — I69351 Hemiplegia and hemiparesis following cerebral infarction affecting right dominant side: Secondary | ICD-10-CM | POA: Diagnosis not present

## 2021-01-15 DIAGNOSIS — L89311 Pressure ulcer of right buttock, stage 1: Secondary | ICD-10-CM | POA: Diagnosis not present

## 2021-01-15 DIAGNOSIS — I69322 Dysarthria following cerebral infarction: Secondary | ICD-10-CM | POA: Diagnosis not present

## 2021-01-15 DIAGNOSIS — E1151 Type 2 diabetes mellitus with diabetic peripheral angiopathy without gangrene: Secondary | ICD-10-CM | POA: Diagnosis not present

## 2021-01-15 DIAGNOSIS — E1122 Type 2 diabetes mellitus with diabetic chronic kidney disease: Secondary | ICD-10-CM | POA: Diagnosis not present

## 2021-01-18 DIAGNOSIS — E1122 Type 2 diabetes mellitus with diabetic chronic kidney disease: Secondary | ICD-10-CM | POA: Diagnosis not present

## 2021-01-18 DIAGNOSIS — I87303 Chronic venous hypertension (idiopathic) without complications of bilateral lower extremity: Secondary | ICD-10-CM | POA: Diagnosis not present

## 2021-01-18 DIAGNOSIS — I129 Hypertensive chronic kidney disease with stage 1 through stage 4 chronic kidney disease, or unspecified chronic kidney disease: Secondary | ICD-10-CM | POA: Diagnosis not present

## 2021-01-18 DIAGNOSIS — L89311 Pressure ulcer of right buttock, stage 1: Secondary | ICD-10-CM | POA: Diagnosis not present

## 2021-01-18 DIAGNOSIS — I69322 Dysarthria following cerebral infarction: Secondary | ICD-10-CM | POA: Diagnosis not present

## 2021-01-18 DIAGNOSIS — I69351 Hemiplegia and hemiparesis following cerebral infarction affecting right dominant side: Secondary | ICD-10-CM | POA: Diagnosis not present

## 2021-01-18 DIAGNOSIS — L89321 Pressure ulcer of left buttock, stage 1: Secondary | ICD-10-CM | POA: Diagnosis not present

## 2021-01-18 DIAGNOSIS — E1151 Type 2 diabetes mellitus with diabetic peripheral angiopathy without gangrene: Secondary | ICD-10-CM | POA: Diagnosis not present

## 2021-01-18 DIAGNOSIS — N183 Chronic kidney disease, stage 3 unspecified: Secondary | ICD-10-CM | POA: Diagnosis not present

## 2021-01-21 DIAGNOSIS — E1122 Type 2 diabetes mellitus with diabetic chronic kidney disease: Secondary | ICD-10-CM | POA: Diagnosis not present

## 2021-01-21 DIAGNOSIS — N183 Chronic kidney disease, stage 3 unspecified: Secondary | ICD-10-CM | POA: Diagnosis not present

## 2021-01-21 DIAGNOSIS — I69322 Dysarthria following cerebral infarction: Secondary | ICD-10-CM | POA: Diagnosis not present

## 2021-01-21 DIAGNOSIS — L89321 Pressure ulcer of left buttock, stage 1: Secondary | ICD-10-CM | POA: Diagnosis not present

## 2021-01-21 DIAGNOSIS — I129 Hypertensive chronic kidney disease with stage 1 through stage 4 chronic kidney disease, or unspecified chronic kidney disease: Secondary | ICD-10-CM | POA: Diagnosis not present

## 2021-01-21 DIAGNOSIS — I69351 Hemiplegia and hemiparesis following cerebral infarction affecting right dominant side: Secondary | ICD-10-CM | POA: Diagnosis not present

## 2021-01-21 DIAGNOSIS — L89311 Pressure ulcer of right buttock, stage 1: Secondary | ICD-10-CM | POA: Diagnosis not present

## 2021-01-21 DIAGNOSIS — I87303 Chronic venous hypertension (idiopathic) without complications of bilateral lower extremity: Secondary | ICD-10-CM | POA: Diagnosis not present

## 2021-01-21 DIAGNOSIS — E1151 Type 2 diabetes mellitus with diabetic peripheral angiopathy without gangrene: Secondary | ICD-10-CM | POA: Diagnosis not present

## 2021-01-23 DIAGNOSIS — I87303 Chronic venous hypertension (idiopathic) without complications of bilateral lower extremity: Secondary | ICD-10-CM | POA: Diagnosis not present

## 2021-01-23 DIAGNOSIS — E1151 Type 2 diabetes mellitus with diabetic peripheral angiopathy without gangrene: Secondary | ICD-10-CM | POA: Diagnosis not present

## 2021-01-23 DIAGNOSIS — N183 Chronic kidney disease, stage 3 unspecified: Secondary | ICD-10-CM | POA: Diagnosis not present

## 2021-01-23 DIAGNOSIS — I69351 Hemiplegia and hemiparesis following cerebral infarction affecting right dominant side: Secondary | ICD-10-CM | POA: Diagnosis not present

## 2021-01-23 DIAGNOSIS — E1122 Type 2 diabetes mellitus with diabetic chronic kidney disease: Secondary | ICD-10-CM | POA: Diagnosis not present

## 2021-01-23 DIAGNOSIS — L89311 Pressure ulcer of right buttock, stage 1: Secondary | ICD-10-CM | POA: Diagnosis not present

## 2021-01-23 DIAGNOSIS — L89321 Pressure ulcer of left buttock, stage 1: Secondary | ICD-10-CM | POA: Diagnosis not present

## 2021-01-23 DIAGNOSIS — I69322 Dysarthria following cerebral infarction: Secondary | ICD-10-CM | POA: Diagnosis not present

## 2021-01-23 DIAGNOSIS — I129 Hypertensive chronic kidney disease with stage 1 through stage 4 chronic kidney disease, or unspecified chronic kidney disease: Secondary | ICD-10-CM | POA: Diagnosis not present

## 2021-01-25 ENCOUNTER — Other Ambulatory Visit: Payer: Self-pay

## 2021-01-25 ENCOUNTER — Ambulatory Visit (INDEPENDENT_AMBULATORY_CARE_PROVIDER_SITE_OTHER): Payer: Medicare HMO | Admitting: Psychiatry

## 2021-01-25 DIAGNOSIS — I10 Essential (primary) hypertension: Secondary | ICD-10-CM | POA: Diagnosis not present

## 2021-01-25 DIAGNOSIS — F4320 Adjustment disorder, unspecified: Secondary | ICD-10-CM

## 2021-01-25 DIAGNOSIS — E1122 Type 2 diabetes mellitus with diabetic chronic kidney disease: Secondary | ICD-10-CM | POA: Diagnosis not present

## 2021-01-25 DIAGNOSIS — E1169 Type 2 diabetes mellitus with other specified complication: Secondary | ICD-10-CM | POA: Diagnosis not present

## 2021-01-25 DIAGNOSIS — G4700 Insomnia, unspecified: Secondary | ICD-10-CM | POA: Diagnosis not present

## 2021-01-25 DIAGNOSIS — Z634 Disappearance and death of family member: Secondary | ICD-10-CM | POA: Diagnosis not present

## 2021-01-25 DIAGNOSIS — N183 Chronic kidney disease, stage 3 unspecified: Secondary | ICD-10-CM | POA: Diagnosis not present

## 2021-01-25 DIAGNOSIS — F3341 Major depressive disorder, recurrent, in partial remission: Secondary | ICD-10-CM | POA: Diagnosis not present

## 2021-01-25 DIAGNOSIS — F331 Major depressive disorder, recurrent, moderate: Secondary | ICD-10-CM

## 2021-01-25 DIAGNOSIS — E785 Hyperlipidemia, unspecified: Secondary | ICD-10-CM | POA: Diagnosis not present

## 2021-01-25 DIAGNOSIS — Z8673 Personal history of transient ischemic attack (TIA), and cerebral infarction without residual deficits: Secondary | ICD-10-CM

## 2021-01-25 NOTE — Progress Notes (Signed)
Psychotherapy Progress Note [facility] Marliss Czar, PhD LP  Patient ID: Dakota Hall     MRN: [id number] Therapy format: Individual psychotherapy Date: 01/25/2021      Start: 11:10a     Stop: 11:55a     Time Spent: 45 min Location: In-person   Session narrative (presenting needs, interim history, self-report of stressors and symptoms, applications of prior therapy, status changes, and interventions made in session) New granddaughter (Dakota Hall's 3rd), not yet seen, probably this weekend.  Has had more calls and visits, with church people reaching out more.  Dakota Hall, a church friend, took the initiative and started spreading word of the desire.  No [facility] in [facility] at [facility] time.  Family revised the decision to go to nursing home, now having daily caregivers (2 regular women, both likable).  Both bathe and change clothing, make some meals.  Independent enough with bedtime ADLs.  TV and naps during alone times.  No loss of nighttime sleep.  Has Meals on Wheels.  Generally feels he isn't interested in going out.  Dakota Hall has made some offer to expand caregiver time, so they can go more places.  Does have some interest in eating out (e.g., [place], [employer]).    Discussed possibility of calling a neglected friend, Dakota Hall.  A little irritated at him for not getting in touch himself, but then it never was the pattern.  Dakota Hall also has history of life-threatening head injury, and he drifted away more after Dakota Hall's stroke 15-20 year ago.   Other ideas for pleasant activity to take a ride, or browse at [facility], but largely doesn't want to go to the trouble of loading/unloading in the car.  Brother Dakota Hall picked him up recently by surprise and went to visit sister in [place], who has dementia.  Affirmed and encouraged.  Relates history first job was [employer], father was Magazine features editor at the [employer] location.    Therapeutic modalities: Cognitive Behavioral Therapy,  Solution-Oriented/Positive Psychology, Ego-Supportive and Faith-sensitive  Mental Status/Observations:  Appearance:   Casual     Behavior:  Appropriate  Motor:  w/c  Speech/Language:   stable, articulation issue s/p stroke  Affect:  Appropriate  Mood:  depressed  Thought process:  normal  Thought content:    WNL  Sensory/Perceptual disturbances:    WNL  Orientation:  Fully oriented  Attention:  Good    Concentration:  Fair  Memory:  grossly intact  Insight:    Good  Judgment:   Fair  Impulse Control:  Good   Risk Assessment: Danger to Self: No Self-injurious Behavior: No Danger to Others: No Physical Aggression / Violence: No Duty to Warn: No Access to Firearms a concern: No  Assessment of progress:  stabilized  Diagnosis:   ICD-10-CM   1. Major depressive disorder, recurrent episode, moderate (HCC)  F33.1   2. Bereavement reaction  F43.20    Z63.4   3. History of stroke  Z86.73    Plan:  . Agreement to call Dakota Hall, specifically, to prompt and renew friendship and opportunities to see each other . Maintain sleep and nutrition . Reach out to other neighbors and friends further, possibly ask church about further outreach.  Work with Dakota Hall on programming more activities that can reasonably involve change of scenery, out in the world . Put in some time going through Dakota Hall's things . Make repeated, small efforts to declutter in prominent areas . Other recommendations/advice as may be noted above . Continue to utilize  previously learned skills ad lib . Maintain medication as prescribed and work faithfully with relevant prescriber(s) if any changes are desired or seem indicated . Call the clinic on-call service, present to ER, or call 911 if any life-threatening psychiatric crisis Return 4-6 wks. . Already scheduled visit in this office 03/27/2021.  Robley Fries, PhD Marliss Czar, PhD LP Clinical Psychologist, [facility] [address] [place], [place] [address] [phone]

## 2021-01-28 DIAGNOSIS — L89311 Pressure ulcer of right buttock, stage 1: Secondary | ICD-10-CM | POA: Diagnosis not present

## 2021-01-28 DIAGNOSIS — I69322 Dysarthria following cerebral infarction: Secondary | ICD-10-CM | POA: Diagnosis not present

## 2021-01-28 DIAGNOSIS — I87303 Chronic venous hypertension (idiopathic) without complications of bilateral lower extremity: Secondary | ICD-10-CM | POA: Diagnosis not present

## 2021-01-28 DIAGNOSIS — E1151 Type 2 diabetes mellitus with diabetic peripheral angiopathy without gangrene: Secondary | ICD-10-CM | POA: Diagnosis not present

## 2021-01-28 DIAGNOSIS — E1122 Type 2 diabetes mellitus with diabetic chronic kidney disease: Secondary | ICD-10-CM | POA: Diagnosis not present

## 2021-01-28 DIAGNOSIS — L89321 Pressure ulcer of left buttock, stage 1: Secondary | ICD-10-CM | POA: Diagnosis not present

## 2021-01-28 DIAGNOSIS — I69351 Hemiplegia and hemiparesis following cerebral infarction affecting right dominant side: Secondary | ICD-10-CM | POA: Diagnosis not present

## 2021-01-28 DIAGNOSIS — I129 Hypertensive chronic kidney disease with stage 1 through stage 4 chronic kidney disease, or unspecified chronic kidney disease: Secondary | ICD-10-CM | POA: Diagnosis not present

## 2021-01-28 DIAGNOSIS — N183 Chronic kidney disease, stage 3 unspecified: Secondary | ICD-10-CM | POA: Diagnosis not present

## 2021-01-29 DIAGNOSIS — I69351 Hemiplegia and hemiparesis following cerebral infarction affecting right dominant side: Secondary | ICD-10-CM | POA: Diagnosis not present

## 2021-01-29 DIAGNOSIS — I69322 Dysarthria following cerebral infarction: Secondary | ICD-10-CM | POA: Diagnosis not present

## 2021-01-29 DIAGNOSIS — I87303 Chronic venous hypertension (idiopathic) without complications of bilateral lower extremity: Secondary | ICD-10-CM | POA: Diagnosis not present

## 2021-01-29 DIAGNOSIS — E1151 Type 2 diabetes mellitus with diabetic peripheral angiopathy without gangrene: Secondary | ICD-10-CM | POA: Diagnosis not present

## 2021-01-29 DIAGNOSIS — L89321 Pressure ulcer of left buttock, stage 1: Secondary | ICD-10-CM | POA: Diagnosis not present

## 2021-01-29 DIAGNOSIS — L89311 Pressure ulcer of right buttock, stage 1: Secondary | ICD-10-CM | POA: Diagnosis not present

## 2021-01-29 DIAGNOSIS — N183 Chronic kidney disease, stage 3 unspecified: Secondary | ICD-10-CM | POA: Diagnosis not present

## 2021-01-29 DIAGNOSIS — I129 Hypertensive chronic kidney disease with stage 1 through stage 4 chronic kidney disease, or unspecified chronic kidney disease: Secondary | ICD-10-CM | POA: Diagnosis not present

## 2021-01-29 DIAGNOSIS — E1122 Type 2 diabetes mellitus with diabetic chronic kidney disease: Secondary | ICD-10-CM | POA: Diagnosis not present

## 2021-02-05 DIAGNOSIS — I69322 Dysarthria following cerebral infarction: Secondary | ICD-10-CM | POA: Diagnosis not present

## 2021-02-05 DIAGNOSIS — E1122 Type 2 diabetes mellitus with diabetic chronic kidney disease: Secondary | ICD-10-CM | POA: Diagnosis not present

## 2021-02-05 DIAGNOSIS — L89321 Pressure ulcer of left buttock, stage 1: Secondary | ICD-10-CM | POA: Diagnosis not present

## 2021-02-05 DIAGNOSIS — L89311 Pressure ulcer of right buttock, stage 1: Secondary | ICD-10-CM | POA: Diagnosis not present

## 2021-02-05 DIAGNOSIS — N183 Chronic kidney disease, stage 3 unspecified: Secondary | ICD-10-CM | POA: Diagnosis not present

## 2021-02-05 DIAGNOSIS — I129 Hypertensive chronic kidney disease with stage 1 through stage 4 chronic kidney disease, or unspecified chronic kidney disease: Secondary | ICD-10-CM | POA: Diagnosis not present

## 2021-02-05 DIAGNOSIS — I69351 Hemiplegia and hemiparesis following cerebral infarction affecting right dominant side: Secondary | ICD-10-CM | POA: Diagnosis not present

## 2021-02-05 DIAGNOSIS — I87303 Chronic venous hypertension (idiopathic) without complications of bilateral lower extremity: Secondary | ICD-10-CM | POA: Diagnosis not present

## 2021-02-05 DIAGNOSIS — E1151 Type 2 diabetes mellitus with diabetic peripheral angiopathy without gangrene: Secondary | ICD-10-CM | POA: Diagnosis not present

## 2021-02-07 DIAGNOSIS — E1151 Type 2 diabetes mellitus with diabetic peripheral angiopathy without gangrene: Secondary | ICD-10-CM | POA: Diagnosis not present

## 2021-02-07 DIAGNOSIS — E1122 Type 2 diabetes mellitus with diabetic chronic kidney disease: Secondary | ICD-10-CM | POA: Diagnosis not present

## 2021-02-07 DIAGNOSIS — N183 Chronic kidney disease, stage 3 unspecified: Secondary | ICD-10-CM | POA: Diagnosis not present

## 2021-02-07 DIAGNOSIS — I129 Hypertensive chronic kidney disease with stage 1 through stage 4 chronic kidney disease, or unspecified chronic kidney disease: Secondary | ICD-10-CM | POA: Diagnosis not present

## 2021-02-07 DIAGNOSIS — I69351 Hemiplegia and hemiparesis following cerebral infarction affecting right dominant side: Secondary | ICD-10-CM | POA: Diagnosis not present

## 2021-02-07 DIAGNOSIS — L89321 Pressure ulcer of left buttock, stage 1: Secondary | ICD-10-CM | POA: Diagnosis not present

## 2021-02-07 DIAGNOSIS — L89311 Pressure ulcer of right buttock, stage 1: Secondary | ICD-10-CM | POA: Diagnosis not present

## 2021-02-07 DIAGNOSIS — I87303 Chronic venous hypertension (idiopathic) without complications of bilateral lower extremity: Secondary | ICD-10-CM | POA: Diagnosis not present

## 2021-02-07 DIAGNOSIS — I69322 Dysarthria following cerebral infarction: Secondary | ICD-10-CM | POA: Diagnosis not present

## 2021-02-11 DIAGNOSIS — I69351 Hemiplegia and hemiparesis following cerebral infarction affecting right dominant side: Secondary | ICD-10-CM | POA: Diagnosis not present

## 2021-02-11 DIAGNOSIS — I129 Hypertensive chronic kidney disease with stage 1 through stage 4 chronic kidney disease, or unspecified chronic kidney disease: Secondary | ICD-10-CM | POA: Diagnosis not present

## 2021-02-11 DIAGNOSIS — L89321 Pressure ulcer of left buttock, stage 1: Secondary | ICD-10-CM | POA: Diagnosis not present

## 2021-02-11 DIAGNOSIS — N183 Chronic kidney disease, stage 3 unspecified: Secondary | ICD-10-CM | POA: Diagnosis not present

## 2021-02-11 DIAGNOSIS — E1151 Type 2 diabetes mellitus with diabetic peripheral angiopathy without gangrene: Secondary | ICD-10-CM | POA: Diagnosis not present

## 2021-02-11 DIAGNOSIS — L89311 Pressure ulcer of right buttock, stage 1: Secondary | ICD-10-CM | POA: Diagnosis not present

## 2021-02-11 DIAGNOSIS — I69322 Dysarthria following cerebral infarction: Secondary | ICD-10-CM | POA: Diagnosis not present

## 2021-02-11 DIAGNOSIS — I87303 Chronic venous hypertension (idiopathic) without complications of bilateral lower extremity: Secondary | ICD-10-CM | POA: Diagnosis not present

## 2021-02-11 DIAGNOSIS — E1122 Type 2 diabetes mellitus with diabetic chronic kidney disease: Secondary | ICD-10-CM | POA: Diagnosis not present

## 2021-02-13 DIAGNOSIS — E1151 Type 2 diabetes mellitus with diabetic peripheral angiopathy without gangrene: Secondary | ICD-10-CM | POA: Diagnosis not present

## 2021-02-13 DIAGNOSIS — I87303 Chronic venous hypertension (idiopathic) without complications of bilateral lower extremity: Secondary | ICD-10-CM | POA: Diagnosis not present

## 2021-02-13 DIAGNOSIS — I69322 Dysarthria following cerebral infarction: Secondary | ICD-10-CM | POA: Diagnosis not present

## 2021-02-13 DIAGNOSIS — I69351 Hemiplegia and hemiparesis following cerebral infarction affecting right dominant side: Secondary | ICD-10-CM | POA: Diagnosis not present

## 2021-02-13 DIAGNOSIS — I129 Hypertensive chronic kidney disease with stage 1 through stage 4 chronic kidney disease, or unspecified chronic kidney disease: Secondary | ICD-10-CM | POA: Diagnosis not present

## 2021-02-13 DIAGNOSIS — N183 Chronic kidney disease, stage 3 unspecified: Secondary | ICD-10-CM | POA: Diagnosis not present

## 2021-02-13 DIAGNOSIS — L89311 Pressure ulcer of right buttock, stage 1: Secondary | ICD-10-CM | POA: Diagnosis not present

## 2021-02-13 DIAGNOSIS — E1122 Type 2 diabetes mellitus with diabetic chronic kidney disease: Secondary | ICD-10-CM | POA: Diagnosis not present

## 2021-02-13 DIAGNOSIS — L89321 Pressure ulcer of left buttock, stage 1: Secondary | ICD-10-CM | POA: Diagnosis not present

## 2021-02-22 DIAGNOSIS — E1122 Type 2 diabetes mellitus with diabetic chronic kidney disease: Secondary | ICD-10-CM | POA: Diagnosis not present

## 2021-02-22 DIAGNOSIS — I129 Hypertensive chronic kidney disease with stage 1 through stage 4 chronic kidney disease, or unspecified chronic kidney disease: Secondary | ICD-10-CM | POA: Diagnosis not present

## 2021-02-22 DIAGNOSIS — L89311 Pressure ulcer of right buttock, stage 1: Secondary | ICD-10-CM | POA: Diagnosis not present

## 2021-02-22 DIAGNOSIS — I69322 Dysarthria following cerebral infarction: Secondary | ICD-10-CM | POA: Diagnosis not present

## 2021-02-22 DIAGNOSIS — I69351 Hemiplegia and hemiparesis following cerebral infarction affecting right dominant side: Secondary | ICD-10-CM | POA: Diagnosis not present

## 2021-02-22 DIAGNOSIS — N183 Chronic kidney disease, stage 3 unspecified: Secondary | ICD-10-CM | POA: Diagnosis not present

## 2021-02-22 DIAGNOSIS — E1151 Type 2 diabetes mellitus with diabetic peripheral angiopathy without gangrene: Secondary | ICD-10-CM | POA: Diagnosis not present

## 2021-02-22 DIAGNOSIS — I87303 Chronic venous hypertension (idiopathic) without complications of bilateral lower extremity: Secondary | ICD-10-CM | POA: Diagnosis not present

## 2021-02-22 DIAGNOSIS — L89321 Pressure ulcer of left buttock, stage 1: Secondary | ICD-10-CM | POA: Diagnosis not present

## 2021-02-26 DIAGNOSIS — E1169 Type 2 diabetes mellitus with other specified complication: Secondary | ICD-10-CM | POA: Diagnosis not present

## 2021-02-26 DIAGNOSIS — N183 Chronic kidney disease, stage 3 unspecified: Secondary | ICD-10-CM | POA: Diagnosis not present

## 2021-02-26 DIAGNOSIS — E1122 Type 2 diabetes mellitus with diabetic chronic kidney disease: Secondary | ICD-10-CM | POA: Diagnosis not present

## 2021-02-26 DIAGNOSIS — F3341 Major depressive disorder, recurrent, in partial remission: Secondary | ICD-10-CM | POA: Diagnosis not present

## 2021-02-26 DIAGNOSIS — G4700 Insomnia, unspecified: Secondary | ICD-10-CM | POA: Diagnosis not present

## 2021-02-26 DIAGNOSIS — E785 Hyperlipidemia, unspecified: Secondary | ICD-10-CM | POA: Diagnosis not present

## 2021-02-26 DIAGNOSIS — I10 Essential (primary) hypertension: Secondary | ICD-10-CM | POA: Diagnosis not present

## 2021-02-28 DIAGNOSIS — L89311 Pressure ulcer of right buttock, stage 1: Secondary | ICD-10-CM | POA: Diagnosis not present

## 2021-02-28 DIAGNOSIS — L89321 Pressure ulcer of left buttock, stage 1: Secondary | ICD-10-CM | POA: Diagnosis not present

## 2021-02-28 DIAGNOSIS — E1151 Type 2 diabetes mellitus with diabetic peripheral angiopathy without gangrene: Secondary | ICD-10-CM | POA: Diagnosis not present

## 2021-02-28 DIAGNOSIS — I69351 Hemiplegia and hemiparesis following cerebral infarction affecting right dominant side: Secondary | ICD-10-CM | POA: Diagnosis not present

## 2021-02-28 DIAGNOSIS — I129 Hypertensive chronic kidney disease with stage 1 through stage 4 chronic kidney disease, or unspecified chronic kidney disease: Secondary | ICD-10-CM | POA: Diagnosis not present

## 2021-02-28 DIAGNOSIS — I69322 Dysarthria following cerebral infarction: Secondary | ICD-10-CM | POA: Diagnosis not present

## 2021-02-28 DIAGNOSIS — N183 Chronic kidney disease, stage 3 unspecified: Secondary | ICD-10-CM | POA: Diagnosis not present

## 2021-02-28 DIAGNOSIS — I87303 Chronic venous hypertension (idiopathic) without complications of bilateral lower extremity: Secondary | ICD-10-CM | POA: Diagnosis not present

## 2021-02-28 DIAGNOSIS — E1122 Type 2 diabetes mellitus with diabetic chronic kidney disease: Secondary | ICD-10-CM | POA: Diagnosis not present

## 2021-03-08 ENCOUNTER — Ambulatory Visit (INDEPENDENT_AMBULATORY_CARE_PROVIDER_SITE_OTHER): Payer: Medicare HMO | Admitting: Psychiatry

## 2021-03-08 ENCOUNTER — Other Ambulatory Visit: Payer: Self-pay

## 2021-03-08 DIAGNOSIS — F4320 Adjustment disorder, unspecified: Secondary | ICD-10-CM

## 2021-03-08 DIAGNOSIS — F331 Major depressive disorder, recurrent, moderate: Secondary | ICD-10-CM

## 2021-03-08 DIAGNOSIS — Z8673 Personal history of transient ischemic attack (TIA), and cerebral infarction without residual deficits: Secondary | ICD-10-CM | POA: Diagnosis not present

## 2021-03-08 DIAGNOSIS — F411 Generalized anxiety disorder: Secondary | ICD-10-CM | POA: Diagnosis not present

## 2021-03-08 DIAGNOSIS — Z634 Disappearance and death of family member: Secondary | ICD-10-CM | POA: Diagnosis not present

## 2021-03-08 NOTE — Progress Notes (Signed)
Psychotherapy Progress Note [facility] Marliss Czar, PhD LP  Patient ID: ELMO RIO     MRN: [id number] Therapy format: Individual psychotherapy Date: 03/08/2021      Start: 11:12a     Stop: 12:00n     Time Spent: 48 min Location: In-person   Session narrative (presenting needs, interim history, self-report of stressors and symptoms, applications of prior therapy, status changes, and interventions made in session) "Fine."  Had decided not to call good friend Gene, then he called, brought lunch, and now Deniece Portela has called back.  Church friends still keeping up enough of a flow of care and concern, between calls and visits.  Enough to freshen up the time he spends.  This is first time out of the house in 6 weeks, but feels that's good enough.   Still resting well.    Big change in that son will move to [place], [place], closer to his inlaws, for help with the baby.  [name] has been coming by 2/wk, doing errands around the house, meaning [name] will have to pick that up.  While both kids out of town, had 5-day care.  Normally 3 days, 3 hrs per.  Confirmed he could ask for more in-home help if he sees the need, just concerned for [name] taking the load, not catastrophizing.  PHQ today - score 10, with most notable symptoms of lost interest/pleasure and feeling down on self.  Compared to a year ago -- feels some less like a burden.  Better will to live.  Still misses [name], of course.  Discussed ideas and impulses to arrange social time out of the house, encouraged idea of asking Gene about going out to eat [place] food.  Encouraged re. getting outside, possibly taking in nice weather.  Discussed possibility -- not committed -- to make a flower arrangement to send with [name].  Discussed guidance he would give himself, either a year ago or some time in the future.  Idea to reach out and ask neighbor to visit -- doesn't have to be long or involved, just taking the initiative.  Therapeutic  modalities: Cognitive Behavioral Therapy and Solution-Oriented/Positive Psychology  Mental Status/Observations:  Appearance:   Casual and Neat     Behavior:  Appropriate  Motor:  w/c  Speech/Language:   clear with post-CVA articulation difficulty  Affect:  Appropriate  Mood:  dysthymic, more responsive  Thought process:  normal  Thought content:    WNL  Sensory/Perceptual disturbances:    WNL  Orientation:  Fully oriented  Attention:  Good    Concentration:  Good  Memory:  WNL  Insight:    Good  Judgment:   Good  Impulse Control:  Good   Risk Assessment: Danger to Self: No Self-injurious Behavior: No Danger to Others: No Physical Aggression / Violence: No Duty to Warn: No Access to Firearms a concern: No  Assessment of progress:  progressing  Diagnosis:   ICD-10-CM   1. Major depressive disorder, recurrent episode, moderate (HCC)  F33.1   2. Bereavement reaction  F43.20    Z63.4   3. History of stroke  Z86.73   4. Generalized anxiety disorder  F41.1    Plan:  . Practice getting a brief excursion outdoors -- porch only is OK, given limits of unassisted mobility . Ask Gene about arranging eating out, e.g., [place] . At least once, preferably 3x, check with neighbors about visiting when lonely . Consider flower arrangement project for Jason's going away . Other recommendations/advice  as may be noted above . Continue to utilize previously learned skills ad lib . Maintain medication as prescribed and work faithfully with relevant prescriber(s) if any changes are desired or seem indicated . Call the clinic on-call service, present to ER, or call 911 if any life-threatening psychiatric crisis Return 6 wks. . Already scheduled visit in this office 03/27/2021.  Robley Fries, PhD Marliss Czar, PhD LP Clinical Psychologist, [facility] [address] [place], [place] [address] [phone]

## 2021-03-13 DIAGNOSIS — L89321 Pressure ulcer of left buttock, stage 1: Secondary | ICD-10-CM | POA: Diagnosis not present

## 2021-03-13 DIAGNOSIS — E1122 Type 2 diabetes mellitus with diabetic chronic kidney disease: Secondary | ICD-10-CM | POA: Diagnosis not present

## 2021-03-13 DIAGNOSIS — I69351 Hemiplegia and hemiparesis following cerebral infarction affecting right dominant side: Secondary | ICD-10-CM | POA: Diagnosis not present

## 2021-03-13 DIAGNOSIS — I87303 Chronic venous hypertension (idiopathic) without complications of bilateral lower extremity: Secondary | ICD-10-CM | POA: Diagnosis not present

## 2021-03-13 DIAGNOSIS — E1151 Type 2 diabetes mellitus with diabetic peripheral angiopathy without gangrene: Secondary | ICD-10-CM | POA: Diagnosis not present

## 2021-03-13 DIAGNOSIS — I129 Hypertensive chronic kidney disease with stage 1 through stage 4 chronic kidney disease, or unspecified chronic kidney disease: Secondary | ICD-10-CM | POA: Diagnosis not present

## 2021-03-13 DIAGNOSIS — L89311 Pressure ulcer of right buttock, stage 1: Secondary | ICD-10-CM | POA: Diagnosis not present

## 2021-03-13 DIAGNOSIS — I69322 Dysarthria following cerebral infarction: Secondary | ICD-10-CM | POA: Diagnosis not present

## 2021-03-13 DIAGNOSIS — N183 Chronic kidney disease, stage 3 unspecified: Secondary | ICD-10-CM | POA: Diagnosis not present

## 2021-03-18 ENCOUNTER — Other Ambulatory Visit: Payer: Self-pay | Admitting: Psychiatry

## 2021-03-18 DIAGNOSIS — I69322 Dysarthria following cerebral infarction: Secondary | ICD-10-CM | POA: Diagnosis not present

## 2021-03-18 DIAGNOSIS — L89321 Pressure ulcer of left buttock, stage 1: Secondary | ICD-10-CM | POA: Diagnosis not present

## 2021-03-18 DIAGNOSIS — E1151 Type 2 diabetes mellitus with diabetic peripheral angiopathy without gangrene: Secondary | ICD-10-CM | POA: Diagnosis not present

## 2021-03-18 DIAGNOSIS — I69351 Hemiplegia and hemiparesis following cerebral infarction affecting right dominant side: Secondary | ICD-10-CM | POA: Diagnosis not present

## 2021-03-18 DIAGNOSIS — I129 Hypertensive chronic kidney disease with stage 1 through stage 4 chronic kidney disease, or unspecified chronic kidney disease: Secondary | ICD-10-CM | POA: Diagnosis not present

## 2021-03-18 DIAGNOSIS — F319 Bipolar disorder, unspecified: Secondary | ICD-10-CM

## 2021-03-18 DIAGNOSIS — E1122 Type 2 diabetes mellitus with diabetic chronic kidney disease: Secondary | ICD-10-CM | POA: Diagnosis not present

## 2021-03-18 DIAGNOSIS — I87303 Chronic venous hypertension (idiopathic) without complications of bilateral lower extremity: Secondary | ICD-10-CM | POA: Diagnosis not present

## 2021-03-18 DIAGNOSIS — L89311 Pressure ulcer of right buttock, stage 1: Secondary | ICD-10-CM | POA: Diagnosis not present

## 2021-03-18 DIAGNOSIS — N183 Chronic kidney disease, stage 3 unspecified: Secondary | ICD-10-CM | POA: Diagnosis not present

## 2021-03-27 ENCOUNTER — Ambulatory Visit (INDEPENDENT_AMBULATORY_CARE_PROVIDER_SITE_OTHER): Payer: Medicare HMO | Admitting: Psychiatry

## 2021-03-27 ENCOUNTER — Other Ambulatory Visit: Payer: Self-pay

## 2021-03-27 ENCOUNTER — Encounter: Payer: Self-pay | Admitting: Psychiatry

## 2021-03-27 DIAGNOSIS — F341 Dysthymic disorder: Secondary | ICD-10-CM

## 2021-03-27 DIAGNOSIS — Z9989 Dependence on other enabling machines and devices: Secondary | ICD-10-CM | POA: Diagnosis not present

## 2021-03-27 DIAGNOSIS — F411 Generalized anxiety disorder: Secondary | ICD-10-CM

## 2021-03-27 DIAGNOSIS — F3341 Major depressive disorder, recurrent, in partial remission: Secondary | ICD-10-CM | POA: Diagnosis not present

## 2021-03-27 DIAGNOSIS — I10 Essential (primary) hypertension: Secondary | ICD-10-CM | POA: Diagnosis not present

## 2021-03-27 DIAGNOSIS — E1169 Type 2 diabetes mellitus with other specified complication: Secondary | ICD-10-CM | POA: Diagnosis not present

## 2021-03-27 DIAGNOSIS — F331 Major depressive disorder, recurrent, moderate: Secondary | ICD-10-CM | POA: Diagnosis not present

## 2021-03-27 DIAGNOSIS — E1122 Type 2 diabetes mellitus with diabetic chronic kidney disease: Secondary | ICD-10-CM | POA: Diagnosis not present

## 2021-03-27 DIAGNOSIS — F4001 Agoraphobia with panic disorder: Secondary | ICD-10-CM

## 2021-03-27 DIAGNOSIS — F319 Bipolar disorder, unspecified: Secondary | ICD-10-CM | POA: Diagnosis not present

## 2021-03-27 DIAGNOSIS — G4700 Insomnia, unspecified: Secondary | ICD-10-CM | POA: Diagnosis not present

## 2021-03-27 DIAGNOSIS — N183 Chronic kidney disease, stage 3 unspecified: Secondary | ICD-10-CM | POA: Diagnosis not present

## 2021-03-27 DIAGNOSIS — G4733 Obstructive sleep apnea (adult) (pediatric): Secondary | ICD-10-CM | POA: Diagnosis not present

## 2021-03-27 DIAGNOSIS — E785 Hyperlipidemia, unspecified: Secondary | ICD-10-CM | POA: Diagnosis not present

## 2021-03-27 MED ORDER — SERTRALINE HCL 100 MG PO TABS: 200.0000 mg | ORAL_TABLET | Freq: Every day | ORAL | 2 refills | Status: AC

## 2021-03-27 MED ORDER — ARIPIPRAZOLE 5 MG PO TABS: 5.0000 mg | ORAL_TABLET | Freq: Every day | ORAL | 2 refills | Status: AC

## 2021-03-27 NOTE — Progress Notes (Signed)
Dakota Hall [id number] 1951/10/28 70 y.o.   Subjective:   Patient ID:  Dakota Hall is a 70 y.o. (DOB 12-05-1950) male.  Chief Complaint:  Chief Complaint  Patient presents with  . Follow-up  . Major depressive disorder, recurrent episode, moderate (HCC)    Depression        Associated symptoms include decreased concentration and fatigue.  Associated symptoms include no headaches and no suicidal ideas.  Dakota Hall presents today for follow-up of depression and anxiety.    seen January 2021.  No med changes then. Wife died Oct 18, 2019 from heart attack. Sudden and unexpected by was on dialysis.    Still living at the same home by himself but has help coming in 3 days/week.  Someone helps with the meds. Kids help.  Lives alone.  Off an on OK with it.  Lonesome.    06/21/20 appt with the following noted: Pretty good overall. So so.  Feels a burden to people.  .  Mild chronic depression.   Very consistent with meds. No SE. depression and anxiety are generally better than last time except when thinks about wife.   Pt reports no sleep issues in lift chair.  Often wants to sleep a lot. Pt reports that appetite is good. Pt reports that energy is lethargic and no change and poor motivation. Concentration is improved and poor. Suicidal thoughts:  denied by patient. Plan: No med changes  03/27/2021 appointment with the following noted: Doing good under the cirucumstances.  Still living at home with help from kids but might need placement DT stroke consequences.  Not able to walk.  Needs help getting to toilet. Mood ok overall.  No unusual anxiety.  No panic.  Still tends to sleep a lot.Uncertain how much but thinks its an hour or 2 and to bed 10p and up at 10 am. Has 5 gkids and he has a new one. No problems with meds.  Continues sertraline 200 and Abilify 5  Takes meds himself in a pill box kid's fixes.  Compliant.  Past Psychiatric Medication Trials: Effexor, Zoloft 200,  Abilify 5, Wellbutrin, riperidone.  Trazodone, buspirone, lithium  Review of Systems:  Review of Systems  Constitutional: Positive for fatigue.  Musculoskeletal: Positive for gait problem.  Neurological: Positive for speech difficulty and weakness. Negative for headaches.  Psychiatric/Behavioral: Positive for decreased concentration, depression and dysphoric mood. Negative for agitation, behavioral problems, confusion, hallucinations, self-injury, sleep disturbance and suicidal ideas. The patient is nervous/anxious. The patient is not hyperactive.     Medications: I have reviewed the patient's current medications.  Current Outpatient Medications  Medication Sig Dispense Refill  . amLODipine (NORVASC) 10 MG tablet Take 1 tablet (10 mg total) by mouth daily. 30 tablet 0  . baclofen (LIORESAL) 20 MG tablet Take 1 tablet (20 mg total) by mouth 3 (three) times daily as needed for muscle spasms. 30 each 0  . Blood Glucose Monitoring Suppl (ONE TOUCH ULTRA 2) W/DEVICE KIT     . docusate sodium (COLACE) 100 MG capsule Take 1 capsule (100 mg total) by mouth 3 (three) times a week. M-W-F 12 capsule 0  . ELIQUIS 2.5 MG TABS tablet     . glucose blood (ONE TOUCH ULTRA TEST) test strip Use each time you check your blood sugar. 100 each 0  . hydrALAZINE (APRESOLINE) 25 MG tablet Take 1 tablet (25 mg total) by mouth 2 (two) times daily. 60 tablet 0  . isosorbide mononitrate (  IMDUR) 30 MG 24 hr tablet Take 1 tablet (30 mg total) by mouth daily. 30 tablet 0  . olmesartan (BENICAR) 5 MG tablet Take 1 tablet (5 mg total) by mouth daily. 30 tablet 0  . ondansetron (ZOFRAN) 4 MG tablet Take 1 tablet (4 mg total) by mouth every 6 (six) hours as needed for nausea or vomiting. 30 tablet 0  . ONETOUCH DELICA LANCETS 78H MISC 1 each by Does not apply route daily as needed. 100 each 0  . pioglitazone (ACTOS) 15 MG tablet Take 0.5 tablets (7.5 mg total) by mouth daily. 15 tablet 0  . potassium chloride SA  (K-DUR,KLOR-CON) 20 MEQ tablet Take 1 tablet (20 mEq total) by mouth daily. 30 tablet 0  . simvastatin (ZOCOR) 20 MG tablet Take 1 tablet (20 mg total) by mouth daily at 6 PM. 30 tablet 0  . traMADol (ULTRAM) 50 MG tablet Take 1 tablet (50 mg total) by mouth 2 (two) times daily as needed for moderate pain. 30 tablet 0  . Vitamin D, Ergocalciferol, (DRISDOL) 50000 UNITS CAPS capsule Take 50,000 Units by mouth every Friday.     Marland Kitchen acetaminophen (TYLENOL) 325 MG tablet Take 2 tablets (650 mg total) by mouth every 6 (six) hours as needed for mild pain (or Fever >/= 101). (Patient not taking: Reported on 03/27/2021)    . ARIPiprazole (ABILIFY) 5 MG tablet Take 1 tablet (5 mg total) by mouth daily. 90 tablet 2  . lidocaine (LIDODERM) 5 % Place 1 patch onto the skin daily. Remove & Discard patch within 12 hours or as directed by MD (Patient not taking: Reported on 03/27/2021) 30 patch 0  . Menthol, Topical Analgesic, (BIOFREEZE) 4 % GEL Apply 1 application topically 2 (two) times daily as needed (Pain). Apply to joints (Patient not taking: Reported on 03/27/2021) 89 mL 0  . nystatin (MYCOSTATIN/NYSTOP) powder Apply topically 2 (two) times daily. (Patient not taking: Reported on 03/27/2021) 15 g 0  . sertraline (ZOLOFT) 100 MG tablet Take 2 tablets (200 mg total) by mouth daily. 180 tablet 2   No current facility-administered medications for this visit.    Medication Side Effects: None  Allergies:  Allergies  Allergen Reactions  . Penicillins Hives and Other (See Comments)    Has patient had a PCN reaction causing immediate rash, facial/tongue/throat swelling, SOB or lightheadedness with hypotension: no Has patient had a PCN reaction causing severe rash involving mucus membranes or skin necrosis: no Has patient had a PCN reaction that required hospitalization: no Has patient had a PCN reaction occurring within the last 10 years: no If all of the above answers are "NO", then may proceed with Cephalosporin  use.     Past Medical History:  Diagnosis Date  . 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 Back spasm   . Chronic kidney disease (CKD), stage III (moderate) (HCC)   . Depression with anxiety   . Diabetes mellitus   . Dysarthria   . Dyslipidemia   . Hypertension   . Obesity   . OSA on CPAP   . Pulmonary emboli ([place]) 02/2014  . Stasis edema of both lower extremities   . Stroke [facility]) July 2013   left paramedian pontine, incidental right parietal subcortical. both d/t small vessel disease  . Stroke [facility]) March 2015   left basal ganglia secondary to small vessel disease    Family History  Problem Relation Age of Onset  . Cancer Mother   . Heart disease  Father     Social History   Socioeconomic History  . Marital status: Married    Spouse name: Mickel Baas   . Number of children: 2  . Years of education: College   . Highest education level: Not on file  Occupational History  . Occupation: disabled    Employer: [employer]  . Occupation: PSR    Employer: [employer]  Tobacco Use  . Smoking status: Never Smoker  . Smokeless tobacco: Never Used  Substance and Sexual Activity  . Alcohol use: No    Alcohol/week: 0.0 standard drinks  . Drug use: No  . Sexual activity: Not on file  Other Topics Concern  . Not on file  Social History Narrative   Patient lives at home with wife Mickel Baas.    Patient has 2 children.    Patient is currently working.    Patient has a Degree.    Social Determinants of Health   Financial Resource Strain: Not on file  Food Insecurity: Not on file  Transportation Needs: Not on file  Physical Activity: Not on file  Stress: Not on file  Social Connections: Not on file  Intimate Partner Violence: Not on file    Past Medical History, Surgical history, Social history, and Family history were reviewed and updated as appropriate.   Please see review of systems for further details on the patient's review from  today.   Objective:   Physical Exam:  There were no vitals taken for this visit.  Physical Exam Neurological:     Mental Status: He is alert and oriented to person, place, and time.     Cranial Nerves: Dysarthria present.     Motor: Weakness present.     Comments: Mild dysarthria WC bound  Psychiatric:        Attention and Perception: Attention and perception normal.        Mood and Affect: Mood is depressed. Mood is not anxious. Affect is not tearful.        Speech: Speech is slurred.        Behavior: Behavior is cooperative.        Thought Content: Thought content normal. Thought content is not paranoid or delusional. Thought content does not include homicidal or suicidal ideation. Thought content does not include homicidal or suicidal plan.        Cognition and Memory: Cognition and memory normal.     Comments: Insight fair and judment fair. Speech affected by history of stroke. Mood and anxiety under control overall     Lab Review:     Component Value Date/Time   NA 140 09/16/2019 1841   NA 142 Oct 11, 1951 0000   K 3.8 09/16/2019 1841   CL 105 09/16/2019 1841   CO2 24 09/16/2019 1841   GLUCOSE 82 09/16/2019 1841   BUN 23 09/16/2019 1841   BUN 16 May 31, 1951 0000   CREATININE 1.48 (H) 09/16/2019 1841   CALCIUM 9.5 09/16/2019 1841   PROT 7.3 03/25/2018 0549   ALBUMIN 3.5 03/25/2018 0549   AST 18 03/25/2018 0549   ALT 20 03/25/2018 0549   ALKPHOS 85 03/25/2018 0549   BILITOT 0.7 03/25/2018 0549   GFRNONAA 48 (L) 09/16/2019 1841   GFRAA 56 (L) 09/16/2019 1841       Component Value Date/Time   WBC 6.2 09/16/2019 1841   RBC 5.25 09/16/2019 1841   HGB 15.8 09/16/2019 1841   HCT 48.2 09/16/2019 1841   PLT 122 (L) 09/16/2019 1841  MCV 91.8 09/16/2019 1841   MCH 30.1 09/16/2019 1841   MCHC 32.8 09/16/2019 1841   RDW 13.3 09/16/2019 1841   LYMPHSABS 1.4 09/16/2019 1841   MONOABS 0.5 09/16/2019 1841   EOSABS 0.1 09/16/2019 1841   BASOSABS 0.0 09/16/2019 1841     No results found for: POCLITH, LITHIUM   No results found for: PHENYTOIN, PHENOBARB, VALPROATE, CBMZ   .res Assessment: Plan:    Major depressive disorder, recurrent episode, moderate (HCC)  Generalized anxiety disorder  Dysthymia  Panic disorder with agoraphobia  OSA on CPAP  Bipolar affective disorder, remission status unspecified ([place]) - Plan: sertraline (ZOLOFT) 100 MG tablet, ARIPiprazole (ABILIFY) 5 MG tablet   Greater than 50% of 30 min face to face time with patient was spent on counseling and coordination of care. We discussed Dakota Hall is back to his baseline of mild chronic depression and anxiety.  He has had multiple relapses in the past due to noncompliance with medication but now his kid's administers the medication.  He also had relapse with reductions in the dosage of Abilify and sertraline.  He is back to baseline with the current dosages.  He is not having side effects.  He is satisfied with the medication.    Kids taking care of him.  Disc potential placement if necessary.  Disc self care.  He's maintaining.  Discussed potential metabolic side effects associated with atypical antipsychotics, as well as potential risk for movement side effects. Advised pt to contact office if movement side effects occur.  no evidence for movement disorder.  Follow-up 6 to 9 months because he is stable again.  It is not worth the risk to try to change the medicines further and he agrees.  No indication to change meds. Continue sertraline 200 and Abilfy 5 None no med changes this visit  Lynder Parents, MD, DFAPA   Please see After Visit Summary for patient specific instructions.  Future Appointments  Date Time Provider [place]  04/17/2021 11:00 AM Blanchie Serve, PhD CP-CP None    No orders of the defined types were placed in this encounter.     -------------------------------

## 2021-03-29 DIAGNOSIS — I87303 Chronic venous hypertension (idiopathic) without complications of bilateral lower extremity: Secondary | ICD-10-CM | POA: Diagnosis not present

## 2021-03-29 DIAGNOSIS — E1122 Type 2 diabetes mellitus with diabetic chronic kidney disease: Secondary | ICD-10-CM | POA: Diagnosis not present

## 2021-03-29 DIAGNOSIS — I129 Hypertensive chronic kidney disease with stage 1 through stage 4 chronic kidney disease, or unspecified chronic kidney disease: Secondary | ICD-10-CM | POA: Diagnosis not present

## 2021-03-29 DIAGNOSIS — I69322 Dysarthria following cerebral infarction: Secondary | ICD-10-CM | POA: Diagnosis not present

## 2021-03-29 DIAGNOSIS — L89321 Pressure ulcer of left buttock, stage 1: Secondary | ICD-10-CM | POA: Diagnosis not present

## 2021-03-29 DIAGNOSIS — I69351 Hemiplegia and hemiparesis following cerebral infarction affecting right dominant side: Secondary | ICD-10-CM | POA: Diagnosis not present

## 2021-03-29 DIAGNOSIS — N183 Chronic kidney disease, stage 3 unspecified: Secondary | ICD-10-CM | POA: Diagnosis not present

## 2021-03-29 DIAGNOSIS — L89311 Pressure ulcer of right buttock, stage 1: Secondary | ICD-10-CM | POA: Diagnosis not present

## 2021-03-29 DIAGNOSIS — E1151 Type 2 diabetes mellitus with diabetic peripheral angiopathy without gangrene: Secondary | ICD-10-CM | POA: Diagnosis not present

## 2021-04-05 DIAGNOSIS — L89311 Pressure ulcer of right buttock, stage 1: Secondary | ICD-10-CM | POA: Diagnosis not present

## 2021-04-05 DIAGNOSIS — E1151 Type 2 diabetes mellitus with diabetic peripheral angiopathy without gangrene: Secondary | ICD-10-CM | POA: Diagnosis not present

## 2021-04-05 DIAGNOSIS — I69322 Dysarthria following cerebral infarction: Secondary | ICD-10-CM | POA: Diagnosis not present

## 2021-04-05 DIAGNOSIS — E1122 Type 2 diabetes mellitus with diabetic chronic kidney disease: Secondary | ICD-10-CM | POA: Diagnosis not present

## 2021-04-05 DIAGNOSIS — N183 Chronic kidney disease, stage 3 unspecified: Secondary | ICD-10-CM | POA: Diagnosis not present

## 2021-04-05 DIAGNOSIS — L89321 Pressure ulcer of left buttock, stage 1: Secondary | ICD-10-CM | POA: Diagnosis not present

## 2021-04-05 DIAGNOSIS — I69351 Hemiplegia and hemiparesis following cerebral infarction affecting right dominant side: Secondary | ICD-10-CM | POA: Diagnosis not present

## 2021-04-05 DIAGNOSIS — I129 Hypertensive chronic kidney disease with stage 1 through stage 4 chronic kidney disease, or unspecified chronic kidney disease: Secondary | ICD-10-CM | POA: Diagnosis not present

## 2021-04-05 DIAGNOSIS — I87303 Chronic venous hypertension (idiopathic) without complications of bilateral lower extremity: Secondary | ICD-10-CM | POA: Diagnosis not present

## 2021-04-08 DIAGNOSIS — E1122 Type 2 diabetes mellitus with diabetic chronic kidney disease: Secondary | ICD-10-CM | POA: Diagnosis not present

## 2021-04-08 DIAGNOSIS — L89321 Pressure ulcer of left buttock, stage 1: Secondary | ICD-10-CM | POA: Diagnosis not present

## 2021-04-08 DIAGNOSIS — N183 Chronic kidney disease, stage 3 unspecified: Secondary | ICD-10-CM | POA: Diagnosis not present

## 2021-04-08 DIAGNOSIS — I87303 Chronic venous hypertension (idiopathic) without complications of bilateral lower extremity: Secondary | ICD-10-CM | POA: Diagnosis not present

## 2021-04-08 DIAGNOSIS — E1151 Type 2 diabetes mellitus with diabetic peripheral angiopathy without gangrene: Secondary | ICD-10-CM | POA: Diagnosis not present

## 2021-04-08 DIAGNOSIS — I69351 Hemiplegia and hemiparesis following cerebral infarction affecting right dominant side: Secondary | ICD-10-CM | POA: Diagnosis not present

## 2021-04-08 DIAGNOSIS — I129 Hypertensive chronic kidney disease with stage 1 through stage 4 chronic kidney disease, or unspecified chronic kidney disease: Secondary | ICD-10-CM | POA: Diagnosis not present

## 2021-04-08 DIAGNOSIS — L89311 Pressure ulcer of right buttock, stage 1: Secondary | ICD-10-CM | POA: Diagnosis not present

## 2021-04-08 DIAGNOSIS — I69322 Dysarthria following cerebral infarction: Secondary | ICD-10-CM | POA: Diagnosis not present

## 2021-04-16 DIAGNOSIS — I69322 Dysarthria following cerebral infarction: Secondary | ICD-10-CM | POA: Diagnosis not present

## 2021-04-16 DIAGNOSIS — I129 Hypertensive chronic kidney disease with stage 1 through stage 4 chronic kidney disease, or unspecified chronic kidney disease: Secondary | ICD-10-CM | POA: Diagnosis not present

## 2021-04-16 DIAGNOSIS — I87303 Chronic venous hypertension (idiopathic) without complications of bilateral lower extremity: Secondary | ICD-10-CM | POA: Diagnosis not present

## 2021-04-16 DIAGNOSIS — E1122 Type 2 diabetes mellitus with diabetic chronic kidney disease: Secondary | ICD-10-CM | POA: Diagnosis not present

## 2021-04-16 DIAGNOSIS — L89311 Pressure ulcer of right buttock, stage 1: Secondary | ICD-10-CM | POA: Diagnosis not present

## 2021-04-16 DIAGNOSIS — L89321 Pressure ulcer of left buttock, stage 1: Secondary | ICD-10-CM | POA: Diagnosis not present

## 2021-04-16 DIAGNOSIS — N183 Chronic kidney disease, stage 3 unspecified: Secondary | ICD-10-CM | POA: Diagnosis not present

## 2021-04-16 DIAGNOSIS — E1151 Type 2 diabetes mellitus with diabetic peripheral angiopathy without gangrene: Secondary | ICD-10-CM | POA: Diagnosis not present

## 2021-04-16 DIAGNOSIS — I69351 Hemiplegia and hemiparesis following cerebral infarction affecting right dominant side: Secondary | ICD-10-CM | POA: Diagnosis not present

## 2021-04-17 ENCOUNTER — Ambulatory Visit (INDEPENDENT_AMBULATORY_CARE_PROVIDER_SITE_OTHER): Payer: Medicare HMO | Admitting: Psychiatry

## 2021-04-17 ENCOUNTER — Other Ambulatory Visit: Payer: Self-pay

## 2021-04-17 DIAGNOSIS — F331 Major depressive disorder, recurrent, moderate: Secondary | ICD-10-CM | POA: Diagnosis not present

## 2021-04-17 DIAGNOSIS — Z8673 Personal history of transient ischemic attack (TIA), and cerebral infarction without residual deficits: Secondary | ICD-10-CM | POA: Diagnosis not present

## 2021-04-17 DIAGNOSIS — Z634 Disappearance and death of family member: Secondary | ICD-10-CM

## 2021-04-17 DIAGNOSIS — F4320 Adjustment disorder, unspecified: Secondary | ICD-10-CM | POA: Diagnosis not present

## 2021-04-17 NOTE — Progress Notes (Signed)
Psychotherapy Progress Note [facility] Marliss Czar, PhD LP  Patient ID: Dakota Hall     MRN: [id number] Therapy format: Individual psychotherapy Date: 04/17/2021      Start: 11:13a     Stop: 11:58a     Time Spent: 45 min Location: In-person   Session narrative (presenting needs, interim history, self-report of stressors and symptoms, applications of prior therapy, status changes, and interventions made in session) "Pretty good."  "So so" mood.  Confirmed decision to remain in the house with in-home caregivers MWF now 4 hrs/day to include bathing, meals while there, standby assistance if toileting.  Generally getting out to eat 1-2 times per 2 weeks.  Gene called and made plans to come over, possibly will start doing so on a regular basis.  Ideally would prefer weekly visits, figures Gene could do about Q 2 wks.  Confirmed could talk with him about that.  Church friends continue some flow of care and concern -- mostly phone calls, no need felt to visit in person.  Did ask neighbor to visit, went well.    [name] has interviewed in [place] now, not yet certain he will take the job but probably.  If favorable, will move probably early July.  No longer interested in making flower arrangements.  Does have continuing PT, with goal of strengthening ability to transfer.    Going through [name]'s things remains an unfinished task, requires the kids' involvement.  Wouldn't feel comfortable going through them with a caregiver.  Discussed options including asking caregiver   Therapeutic modalities: Cognitive Behavioral Therapy, Solution-Oriented/Positive Psychology and Grief Therapy  Mental Status/Observations:  Appearance:   Casual     Behavior:  Appropriate and passive  Motor:  w/c bound  Speech/Language:   goal-directed, little spontaneous, articulation issue post-stroke  Affect:  Flat  Mood:  dysthymic  Thought process:  normal  Thought content:    WNL  Sensory/Perceptual  disturbances:    WNL  Orientation:  Fully oriented  Attention:  Good    Concentration:  Good  Memory:  WNL  Insight:    Good  Judgment:   Good  Impulse Control:  Good   Risk Assessment: Danger to Self: No Self-injurious Behavior: No Danger to Others: No Physical Aggression / Violence: No Duty to Warn: No Access to Firearms a concern: No  Assessment of progress:  progressing  Diagnosis:   ICD-10-CM   1. Major depressive disorder, recurrent episode, moderate (HCC)  F33.1   2. Bereavement reaction  F43.20    Z63.4   3. History of stroke  Z86.73    Plan:  . Continue making invitations to others for visits or social contact he wants -- be bold . Option to ask about arranging a grandkids' visit day before [name] & family move . Call [name] -- preferably today -- about the upstairs bathroom towels and then have caregiver Carollee Herter fetch them and stage them for [employer].  Options to spend time with them, say goodbye, risk grieving in front of [place]. . Self-affirm that each and every act of addressing [name]'s things and getting the useful ones useful again with the next user is an act of love and honor toward Tucker.  Don't hang too much waiting/putting off on whether the kids might want something -- ask, and dig into some measure of [name]'s things whenever reasonably possible. . Other recommendations/advice as may be noted above . Continue to utilize previously learned skills ad lib . Maintain medication as  prescribed and work faithfully with relevant prescriber(s) if any changes are desired or seem indicated . Call the clinic on-call service, present to ER, or call 911 if any life-threatening psychiatric crisis Return up to 6 wks. . Already scheduled visit in this office Visit date not found.  Robley Fries, PhD Marliss Czar, PhD LP Clinical Psychologist, [facility] [address] [place], [place] [address] [phone]

## 2021-04-25 DIAGNOSIS — E785 Hyperlipidemia, unspecified: Secondary | ICD-10-CM | POA: Diagnosis not present

## 2021-04-25 DIAGNOSIS — N183 Chronic kidney disease, stage 3 unspecified: Secondary | ICD-10-CM | POA: Diagnosis not present

## 2021-04-25 DIAGNOSIS — E1122 Type 2 diabetes mellitus with diabetic chronic kidney disease: Secondary | ICD-10-CM | POA: Diagnosis not present

## 2021-04-25 DIAGNOSIS — E1169 Type 2 diabetes mellitus with other specified complication: Secondary | ICD-10-CM | POA: Diagnosis not present

## 2021-04-25 DIAGNOSIS — G4700 Insomnia, unspecified: Secondary | ICD-10-CM | POA: Diagnosis not present

## 2021-04-25 DIAGNOSIS — F3341 Major depressive disorder, recurrent, in partial remission: Secondary | ICD-10-CM | POA: Diagnosis not present

## 2021-04-25 DIAGNOSIS — I10 Essential (primary) hypertension: Secondary | ICD-10-CM | POA: Diagnosis not present

## 2021-05-03 DIAGNOSIS — I10 Essential (primary) hypertension: Secondary | ICD-10-CM | POA: Diagnosis not present

## 2021-05-03 DIAGNOSIS — F3341 Major depressive disorder, recurrent, in partial remission: Secondary | ICD-10-CM | POA: Diagnosis not present

## 2021-05-03 DIAGNOSIS — E1122 Type 2 diabetes mellitus with diabetic chronic kidney disease: Secondary | ICD-10-CM | POA: Diagnosis not present

## 2021-05-03 DIAGNOSIS — G4733 Obstructive sleep apnea (adult) (pediatric): Secondary | ICD-10-CM | POA: Diagnosis not present

## 2021-05-03 DIAGNOSIS — I69322 Dysarthria following cerebral infarction: Secondary | ICD-10-CM | POA: Diagnosis not present

## 2021-05-03 DIAGNOSIS — N183 Chronic kidney disease, stage 3 unspecified: Secondary | ICD-10-CM | POA: Diagnosis not present

## 2021-05-03 DIAGNOSIS — I87303 Chronic venous hypertension (idiopathic) without complications of bilateral lower extremity: Secondary | ICD-10-CM | POA: Diagnosis not present

## 2021-05-03 DIAGNOSIS — I69351 Hemiplegia and hemiparesis following cerebral infarction affecting right dominant side: Secondary | ICD-10-CM | POA: Diagnosis not present

## 2021-05-03 DIAGNOSIS — E1151 Type 2 diabetes mellitus with diabetic peripheral angiopathy without gangrene: Secondary | ICD-10-CM | POA: Diagnosis not present

## 2021-05-13 DIAGNOSIS — G4733 Obstructive sleep apnea (adult) (pediatric): Secondary | ICD-10-CM | POA: Diagnosis not present

## 2021-05-13 DIAGNOSIS — I69351 Hemiplegia and hemiparesis following cerebral infarction affecting right dominant side: Secondary | ICD-10-CM | POA: Diagnosis not present

## 2021-05-13 DIAGNOSIS — E1151 Type 2 diabetes mellitus with diabetic peripheral angiopathy without gangrene: Secondary | ICD-10-CM | POA: Diagnosis not present

## 2021-05-13 DIAGNOSIS — F3341 Major depressive disorder, recurrent, in partial remission: Secondary | ICD-10-CM | POA: Diagnosis not present

## 2021-05-13 DIAGNOSIS — E1122 Type 2 diabetes mellitus with diabetic chronic kidney disease: Secondary | ICD-10-CM | POA: Diagnosis not present

## 2021-05-13 DIAGNOSIS — I87303 Chronic venous hypertension (idiopathic) without complications of bilateral lower extremity: Secondary | ICD-10-CM | POA: Diagnosis not present

## 2021-05-13 DIAGNOSIS — I69322 Dysarthria following cerebral infarction: Secondary | ICD-10-CM | POA: Diagnosis not present

## 2021-05-13 DIAGNOSIS — I10 Essential (primary) hypertension: Secondary | ICD-10-CM | POA: Diagnosis not present

## 2021-05-13 DIAGNOSIS — N183 Chronic kidney disease, stage 3 unspecified: Secondary | ICD-10-CM | POA: Diagnosis not present

## 2021-05-23 DIAGNOSIS — I10 Essential (primary) hypertension: Secondary | ICD-10-CM | POA: Diagnosis not present

## 2021-05-23 DIAGNOSIS — E1122 Type 2 diabetes mellitus with diabetic chronic kidney disease: Secondary | ICD-10-CM | POA: Diagnosis not present

## 2021-05-23 DIAGNOSIS — E1169 Type 2 diabetes mellitus with other specified complication: Secondary | ICD-10-CM | POA: Diagnosis not present

## 2021-05-23 DIAGNOSIS — E785 Hyperlipidemia, unspecified: Secondary | ICD-10-CM | POA: Diagnosis not present

## 2021-05-23 DIAGNOSIS — G4700 Insomnia, unspecified: Secondary | ICD-10-CM | POA: Diagnosis not present

## 2021-05-23 DIAGNOSIS — F3341 Major depressive disorder, recurrent, in partial remission: Secondary | ICD-10-CM | POA: Diagnosis not present

## 2021-05-23 DIAGNOSIS — N183 Chronic kidney disease, stage 3 unspecified: Secondary | ICD-10-CM | POA: Diagnosis not present

## 2021-05-27 DIAGNOSIS — I69322 Dysarthria following cerebral infarction: Secondary | ICD-10-CM | POA: Diagnosis not present

## 2021-05-27 DIAGNOSIS — I87303 Chronic venous hypertension (idiopathic) without complications of bilateral lower extremity: Secondary | ICD-10-CM | POA: Diagnosis not present

## 2021-05-27 DIAGNOSIS — E1122 Type 2 diabetes mellitus with diabetic chronic kidney disease: Secondary | ICD-10-CM | POA: Diagnosis not present

## 2021-05-27 DIAGNOSIS — E1151 Type 2 diabetes mellitus with diabetic peripheral angiopathy without gangrene: Secondary | ICD-10-CM | POA: Diagnosis not present

## 2021-05-27 DIAGNOSIS — F3341 Major depressive disorder, recurrent, in partial remission: Secondary | ICD-10-CM | POA: Diagnosis not present

## 2021-05-27 DIAGNOSIS — I10 Essential (primary) hypertension: Secondary | ICD-10-CM | POA: Diagnosis not present

## 2021-05-27 DIAGNOSIS — G4733 Obstructive sleep apnea (adult) (pediatric): Secondary | ICD-10-CM | POA: Diagnosis not present

## 2021-05-27 DIAGNOSIS — I69351 Hemiplegia and hemiparesis following cerebral infarction affecting right dominant side: Secondary | ICD-10-CM | POA: Diagnosis not present

## 2021-05-27 DIAGNOSIS — N183 Chronic kidney disease, stage 3 unspecified: Secondary | ICD-10-CM | POA: Diagnosis not present

## 2021-05-29 ENCOUNTER — Ambulatory Visit (INDEPENDENT_AMBULATORY_CARE_PROVIDER_SITE_OTHER): Payer: Medicare HMO | Admitting: Psychiatry

## 2021-05-29 ENCOUNTER — Other Ambulatory Visit: Payer: Self-pay

## 2021-05-29 DIAGNOSIS — Z634 Disappearance and death of family member: Secondary | ICD-10-CM | POA: Diagnosis not present

## 2021-05-29 DIAGNOSIS — Z8673 Personal history of transient ischemic attack (TIA), and cerebral infarction without residual deficits: Secondary | ICD-10-CM

## 2021-05-29 DIAGNOSIS — F331 Major depressive disorder, recurrent, moderate: Secondary | ICD-10-CM | POA: Diagnosis not present

## 2021-05-29 DIAGNOSIS — F4320 Adjustment disorder, unspecified: Secondary | ICD-10-CM | POA: Diagnosis not present

## 2021-05-29 NOTE — Progress Notes (Signed)
Psychotherapy Progress Note [facility] [place] Czar, PhD LP  Patient ID: Dakota Hall     MRN: [id number] Therapy format: Individual psychotherapy Date: 05/29/2021      Start: 11:20a     Stop: 12:00n     Time Spent: 40 min Location: In-person   Session narrative (presenting needs, interim history, self-report of stressors and symptoms, applications of prior therapy, status changes, and interventions made in session) Going pretty well.  Still living at home, with an increased level of home care.  Says it's picking up how people come see him and call, thanks to a good friend.  Loneliness down to a manageable size.  Dakota Hall is coming over, brings lunch some, plans to go out as a threesome in a couple weeks.  Working out to see him about 2/mo.  No need to work out further   Dakota Hall did take the job in [place] and they have moved.  First time he's had to deal with family moving away, but using the phone, not just waiting to be called.  Did have a grandkids' visit before moving.    Dakota Hall remains in good touch and seems to be holding up bing the main family assistance and manger.    Good working relationship with caregiver Dakota Hall, who is now 4-5 months on.  Only problem is turnover, over time.    Decided against moving out the towels from the upstairs bathroom, wants to wait until he can have dedicated time with Dakota Hall, and possibly Dakota Hall, to go over things.  Probed possibility of going through some things and merely staging them to go, pending the kids looking through, but not up to that, either.  Would just rather have the companionship while addressing them, in case he feels tearful.    Physically, PT still comes in, and PCP coming this Friday for routine followup.  Does 1 session of home exercises, PT wants him to do 2.  Discussed antiprocrastination tactics, mainly to break down exercises and introduce fun imagery (e.g., be one of the Rockettes when he does leg lifts).     Overall, mood running 7-8 out of 10.  Looking ahead, Dakota Hall's birthday comes up in November.  Expecting visit from Dakota Hall and Dakota Hall soon.    Therapeutic modalities: Cognitive Behavioral Therapy, Solution-Oriented/Positive Psychology, and Ego-Supportive  Mental Status/Observations:  Appearance:   Casual     Behavior:  Appropriate  Motor:  Wheelchair, right-sided paralysis  Speech/Language:   Generally clear, articulation issues from CVA  Affect:  Appropriate and more responsive  Mood:  Less depressed, basically normal  Thought process:  normal  Thought content:    WNL  Sensory/Perceptual disturbances:    WNL  Orientation:  Fully oriented  Attention:  Good    Concentration:  Good  Memory:  WNL  Insight:    Good  Judgment:   Good  Impulse Control:  Good   Risk Assessment: Danger to Self: No Self-injurious Behavior: No Danger to Others: No Physical Aggression / Violence: No Duty to Warn: No Access to Firearms a concern: No  Assessment of progress:  progressing  Diagnosis:   ICD-10-CM   1. Major depressive disorder, recurrent episode, moderate (HCC)  F33.1     2. Bereavement reaction  F43.20    Z63.4     3. History of stroke  Z86.73      Plan:  Encourage maintain social contacts, still initiate some calls himself Encourage address some of Dakota Hall's things before adult children can join  him, be willing to see, consider, set aside to run by kids Use imagery to help make exercise more appealing -- "spoon full of sugar" principle Other recommendations/advice as may be noted above Continue to utilize previously learned skills ad lib Maintain medication as prescribed and work faithfully with relevant prescriber(s) if any changes are desired or seem indicated Call the clinic on-call service, present to ER, or call 911 if any life-threatening psychiatric crisis Return in about 6 weeks (around 07/10/2021). Already scheduled visit in this office Visit date not found.  Robley Fries,  PhD Dakota Czar, PhD LP Clinical Psychologist, [facility] [address] [place], [place] [address] [phone]

## 2021-05-31 DIAGNOSIS — E1169 Type 2 diabetes mellitus with other specified complication: Secondary | ICD-10-CM | POA: Diagnosis not present

## 2021-05-31 DIAGNOSIS — I2782 Chronic pulmonary embolism: Secondary | ICD-10-CM | POA: Diagnosis not present

## 2021-05-31 DIAGNOSIS — R262 Difficulty in walking, not elsewhere classified: Secondary | ICD-10-CM | POA: Diagnosis not present

## 2021-05-31 DIAGNOSIS — E785 Hyperlipidemia, unspecified: Secondary | ICD-10-CM | POA: Diagnosis not present

## 2021-05-31 DIAGNOSIS — G4733 Obstructive sleep apnea (adult) (pediatric): Secondary | ICD-10-CM | POA: Diagnosis not present

## 2021-05-31 DIAGNOSIS — Z0000 Encounter for general adult medical examination without abnormal findings: Secondary | ICD-10-CM | POA: Diagnosis not present

## 2021-05-31 DIAGNOSIS — F3341 Major depressive disorder, recurrent, in partial remission: Secondary | ICD-10-CM | POA: Diagnosis not present

## 2021-05-31 DIAGNOSIS — E11319 Type 2 diabetes mellitus with unspecified diabetic retinopathy without macular edema: Secondary | ICD-10-CM | POA: Diagnosis not present

## 2021-05-31 DIAGNOSIS — E559 Vitamin D deficiency, unspecified: Secondary | ICD-10-CM | POA: Diagnosis not present

## 2021-05-31 DIAGNOSIS — I10 Essential (primary) hypertension: Secondary | ICD-10-CM | POA: Diagnosis not present

## 2021-05-31 DIAGNOSIS — E1122 Type 2 diabetes mellitus with diabetic chronic kidney disease: Secondary | ICD-10-CM | POA: Diagnosis not present

## 2021-05-31 DIAGNOSIS — Z125 Encounter for screening for malignant neoplasm of prostate: Secondary | ICD-10-CM | POA: Diagnosis not present

## 2021-06-11 DIAGNOSIS — N183 Chronic kidney disease, stage 3 unspecified: Secondary | ICD-10-CM | POA: Diagnosis not present

## 2021-06-11 DIAGNOSIS — E1151 Type 2 diabetes mellitus with diabetic peripheral angiopathy without gangrene: Secondary | ICD-10-CM | POA: Diagnosis not present

## 2021-06-11 DIAGNOSIS — I69322 Dysarthria following cerebral infarction: Secondary | ICD-10-CM | POA: Diagnosis not present

## 2021-06-11 DIAGNOSIS — I69351 Hemiplegia and hemiparesis following cerebral infarction affecting right dominant side: Secondary | ICD-10-CM | POA: Diagnosis not present

## 2021-06-11 DIAGNOSIS — F3341 Major depressive disorder, recurrent, in partial remission: Secondary | ICD-10-CM | POA: Diagnosis not present

## 2021-06-11 DIAGNOSIS — I10 Essential (primary) hypertension: Secondary | ICD-10-CM | POA: Diagnosis not present

## 2021-06-11 DIAGNOSIS — G4733 Obstructive sleep apnea (adult) (pediatric): Secondary | ICD-10-CM | POA: Diagnosis not present

## 2021-06-11 DIAGNOSIS — E1122 Type 2 diabetes mellitus with diabetic chronic kidney disease: Secondary | ICD-10-CM | POA: Diagnosis not present

## 2021-06-11 DIAGNOSIS — I87303 Chronic venous hypertension (idiopathic) without complications of bilateral lower extremity: Secondary | ICD-10-CM | POA: Diagnosis not present

## 2021-06-21 DIAGNOSIS — E785 Hyperlipidemia, unspecified: Secondary | ICD-10-CM | POA: Diagnosis not present

## 2021-06-21 DIAGNOSIS — N183 Chronic kidney disease, stage 3 unspecified: Secondary | ICD-10-CM | POA: Diagnosis not present

## 2021-06-21 DIAGNOSIS — F3341 Major depressive disorder, recurrent, in partial remission: Secondary | ICD-10-CM | POA: Diagnosis not present

## 2021-06-21 DIAGNOSIS — I10 Essential (primary) hypertension: Secondary | ICD-10-CM | POA: Diagnosis not present

## 2021-06-21 DIAGNOSIS — E1122 Type 2 diabetes mellitus with diabetic chronic kidney disease: Secondary | ICD-10-CM | POA: Diagnosis not present

## 2021-06-21 DIAGNOSIS — E1169 Type 2 diabetes mellitus with other specified complication: Secondary | ICD-10-CM | POA: Diagnosis not present

## 2021-06-21 DIAGNOSIS — G4700 Insomnia, unspecified: Secondary | ICD-10-CM | POA: Diagnosis not present

## 2021-06-24 DIAGNOSIS — I87303 Chronic venous hypertension (idiopathic) without complications of bilateral lower extremity: Secondary | ICD-10-CM | POA: Diagnosis not present

## 2021-06-24 DIAGNOSIS — E1151 Type 2 diabetes mellitus with diabetic peripheral angiopathy without gangrene: Secondary | ICD-10-CM | POA: Diagnosis not present

## 2021-06-24 DIAGNOSIS — E1122 Type 2 diabetes mellitus with diabetic chronic kidney disease: Secondary | ICD-10-CM | POA: Diagnosis not present

## 2021-06-24 DIAGNOSIS — I69322 Dysarthria following cerebral infarction: Secondary | ICD-10-CM | POA: Diagnosis not present

## 2021-06-24 DIAGNOSIS — F419 Anxiety disorder, unspecified: Secondary | ICD-10-CM | POA: Diagnosis not present

## 2021-06-24 DIAGNOSIS — I69351 Hemiplegia and hemiparesis following cerebral infarction affecting right dominant side: Secondary | ICD-10-CM | POA: Diagnosis not present

## 2021-06-24 DIAGNOSIS — F3341 Major depressive disorder, recurrent, in partial remission: Secondary | ICD-10-CM | POA: Diagnosis not present

## 2021-06-24 DIAGNOSIS — N183 Chronic kidney disease, stage 3 unspecified: Secondary | ICD-10-CM | POA: Diagnosis not present

## 2021-06-24 DIAGNOSIS — I10 Essential (primary) hypertension: Secondary | ICD-10-CM | POA: Diagnosis not present

## 2021-06-25 ENCOUNTER — Emergency Department (HOSPITAL_COMMUNITY)
Admission: EM | Admit: 2021-06-25 | Discharge: 2021-06-26 | Disposition: A | Discharge status: Home / Self Care | Payer: Medicare HMO | Attending: Emergency Medicine | Admitting: Emergency Medicine

## 2021-06-25 ENCOUNTER — Emergency Department (HOSPITAL_COMMUNITY): Payer: Medicare HMO

## 2021-06-25 ENCOUNTER — Encounter (HOSPITAL_COMMUNITY): Payer: Self-pay | Admitting: Emergency Medicine

## 2021-06-25 DIAGNOSIS — I10 Essential (primary) hypertension: Secondary | ICD-10-CM | POA: Diagnosis not present

## 2021-06-25 DIAGNOSIS — S42024A Nondisplaced fracture of shaft of right clavicle, initial encounter for closed fracture: Secondary | ICD-10-CM | POA: Diagnosis not present

## 2021-06-25 DIAGNOSIS — Z79899 Other long term (current) drug therapy: Secondary | ICD-10-CM | POA: Diagnosis not present

## 2021-06-25 DIAGNOSIS — Z7984 Long term (current) use of oral hypoglycemic drugs: Secondary | ICD-10-CM | POA: Diagnosis not present

## 2021-06-25 DIAGNOSIS — S42034A Nondisplaced fracture of lateral end of right clavicle, initial encounter for closed fracture: Secondary | ICD-10-CM

## 2021-06-25 DIAGNOSIS — I129 Hypertensive chronic kidney disease with stage 1 through stage 4 chronic kidney disease, or unspecified chronic kidney disease: Secondary | ICD-10-CM | POA: Diagnosis not present

## 2021-06-25 DIAGNOSIS — W050XXA Fall from non-moving wheelchair, initial encounter: Secondary | ICD-10-CM | POA: Insufficient documentation

## 2021-06-25 DIAGNOSIS — S40021A Contusion of right upper arm, initial encounter: Secondary | ICD-10-CM

## 2021-06-25 DIAGNOSIS — W19XXXA Unspecified fall, initial encounter: Secondary | ICD-10-CM

## 2021-06-25 DIAGNOSIS — M25519 Pain in unspecified shoulder: Secondary | ICD-10-CM | POA: Diagnosis not present

## 2021-06-25 DIAGNOSIS — E1159 Type 2 diabetes mellitus with other circulatory complications: Secondary | ICD-10-CM | POA: Insufficient documentation

## 2021-06-25 DIAGNOSIS — M7989 Other specified soft tissue disorders: Secondary | ICD-10-CM | POA: Diagnosis not present

## 2021-06-25 DIAGNOSIS — Z7901 Long term (current) use of anticoagulants: Secondary | ICD-10-CM | POA: Diagnosis not present

## 2021-06-25 DIAGNOSIS — S4991XA Unspecified injury of right shoulder and upper arm, initial encounter: Secondary | ICD-10-CM | POA: Diagnosis present

## 2021-06-25 DIAGNOSIS — N183 Chronic kidney disease, stage 3 unspecified: Secondary | ICD-10-CM | POA: Insufficient documentation

## 2021-06-25 DIAGNOSIS — S42031A Displaced fracture of lateral end of right clavicle, initial encounter for closed fracture: Secondary | ICD-10-CM | POA: Diagnosis not present

## 2021-06-25 MED ORDER — TRAMADOL HCL 50 MG PO TABS: 50.0000 mg | ORAL_TABLET | Freq: Four times a day (QID) | ORAL | 0 refills | Status: AC | PRN

## 2021-06-25 MED ORDER — TRAMADOL HCL 50 MG PO TABS
50.0000 mg | ORAL_TABLET | Freq: Once | ORAL | Status: AC
  Administered 2021-06-25: 50 mg via ORAL
  Filled 2021-06-25: qty 1

## 2021-06-25 NOTE — ED Notes (Signed)
Theodoro Clock daughter [phone]

## 2021-06-25 NOTE — ED Provider Notes (Signed)
[facility] DEPT Provider Note   CSN: [id number] Arrival date & time: 06/25/21  2038     History Chief Complaint  Patient presents with   Fall   Shoulder Pain    Dakota Hall is a 70 y.o. male.  HPI Patient injured his right upper arm when he fell, while transferring from a lift chair to his wheelchair.  He was able to crawl to the telephone to get help.  He does not feel like he injured his head, neck, legs or left arm.  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  He denies change in appetite, recent fever, cough, shortness of breath or chest pain.  There are no other known active modifying factors.    Past Medical History:  Diagnosis Date   Abnormality of gait following cerebrovascular accident (CVA)    Back spasm    Chronic kidney disease (CKD), stage III (moderate) (HCC)    Depression with anxiety    Diabetes mellitus    Dysarthria    Dyslipidemia    Hypertension    Obesity    OSA on CPAP    Pulmonary emboli ([place]) 02/2014   Stasis edema of both lower extremities    Stroke [facility]) July 2013   left paramedian pontine, incidental right parietal subcortical. 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   Closed compression fracture of fourth lumbar vertebra (HCC)    Compression fracture of L4 lumbar vertebra, closed, initial encounter ([place]) 03/24/2018   Lumbago 03/24/2018   History of pulmonary embolism 05/03/2014   AKI (acute kidney injury) ([place]) 04/22/2014   Bradycardia 04/22/2014   Encephalopathy 04/22/2014   Malnutrition of moderate degree ([place]) 03/15/2014   CVA (cerebral infarction) 02/08/2014   Depression with anxiety     Malignant hypertension 02/06/2014   Obesity    Stroke ([place]) 02/03/2014   Unspecified cerebral artery occlusion with cerebral infarction 07/20/2013   Stroke, small vessel ([place]) 05/18/2012   Slurred speech 05/15/2012   HTN (hypertension) 05/15/2012   Noncompliance with medication regimen 05/15/2012   DM (diabetes mellitus) ([place]) 05/15/2012   Vision changes 05/15/2012    Past Surgical History:  Procedure Laterality Date   ANAL FISSURE REPAIR     IR KYPHO EA ADDL LEVEL THORACIC OR LUMBAR  04/16/2018   IR KYPHO LUMBAR INC FX REDUCE BONE BX UNI/BIL CANNULATION INC/IMAGING  04/16/2018   IR RADIOLOGIST EVAL & MGMT  05/04/2018   TONSILLECTOMY         Family History  Problem Relation Age of Onset   Cancer Mother    Heart disease Father     Social History   Tobacco Use   Smoking status: Never   Smokeless tobacco: Never  Substance Use Topics   Alcohol use: No    Alcohol/week: 0.0 standard drinks   Drug use: No    Home Medications Prior to Admission medications   Medication Sig Start Date End Date Taking? Authorizing Provider  traMADol (ULTRAM) 50 MG tablet Take 1 tablet (50 mg total) by mouth every 6 (six) hours as needed. 06/25/21  Yes Daleen Bo, MD  acetaminophen (TYLENOL) 325 MG tablet Take 2  tablets (650 mg total) by mouth every 6 (six) hours as needed for mild pain (or Fever >/= 101). Patient not taking: Reported on 03/27/2021 04/19/18   Cristal Ford, DO  amLODipine (NORVASC) 10 MG tablet Take 1 tablet (10 mg total) by mouth daily. 05/13/18   Medina-Vargas, Monina C, NP  ARIPiprazole (ABILIFY) 5 MG tablet Take 1 tablet (5 mg total) by mouth daily. 03/27/21   Cottle, Billey Co., MD  baclofen (LIORESAL) 20 MG tablet Take 1 tablet (20 mg total) by mouth 3 (three) times daily as needed for muscle spasms. 05/13/18   Medina-Vargas, Monina C, NP  Blood Glucose Monitoring Suppl (ONE TOUCH ULTRA 2) W/DEVICE KIT  05/14/14   [provider]  docusate sodium (COLACE) 100 MG  capsule Take 1 capsule (100 mg total) by mouth 3 (three) times a week. M-W-F 05/14/18   Medina-Vargas, Senaida Lange, NP  ELIQUIS 2.5 MG TABS tablet  11/18/19   [provider]  glucose blood (ONE TOUCH ULTRA TEST) test strip Use each time you check your blood sugar. 05/13/18   Medina-Vargas, Monina C, NP  hydrALAZINE (APRESOLINE) 25 MG tablet Take 1 tablet (25 mg total) by mouth 2 (two) times daily. 05/13/18   Medina-Vargas, Monina C, NP  isosorbide mononitrate (IMDUR) 30 MG 24 hr tablet Take 1 tablet (30 mg total) by mouth daily. 05/13/18   Medina-Vargas, Monina C, NP  lidocaine (LIDODERM) 5 % Place 1 patch onto the skin daily. Remove & Discard patch within 12 hours or as directed by MD Patient not taking: Reported on 03/27/2021 05/13/18   Medina-Vargas, Monina C, NP  Menthol, Topical Analgesic, (BIOFREEZE) 4 % GEL Apply 1 application topically 2 (two) times daily as needed (Pain). Apply to joints Patient not taking: Reported on 03/27/2021 05/13/18   Medina-Vargas, Monina C, NP  nystatin (MYCOSTATIN/NYSTOP) powder Apply topically 2 (two) times daily. Patient not taking: Reported on 03/27/2021 05/13/18   Medina-Vargas, Monina C, NP  olmesartan (BENICAR) 5 MG tablet Take 1 tablet (5 mg total) by mouth daily. 05/13/18   Medina-Vargas, Monina C, NP  ondansetron (ZOFRAN) 4 MG tablet Take 1 tablet (4 mg total) by mouth every 6 (six) hours as needed for nausea or vomiting. 05/13/18   Medina-Vargas, Monina C, NP  ONETOUCH DELICA LANCETS 35K MISC 1 each by Does not apply route daily as needed. 05/13/18   Medina-Vargas, Monina C, NP  pioglitazone (ACTOS) 15 MG tablet Take 0.5 tablets (7.5 mg total) by mouth daily. 05/13/18   Medina-Vargas, Monina C, NP  potassium chloride SA (K-DUR,KLOR-CON) 20 MEQ tablet Take 1 tablet (20 mEq total) by mouth daily. 05/13/18   Medina-Vargas, Monina C, NP  sertraline (ZOLOFT) 100 MG tablet Take 2 tablets (200 mg total) by mouth daily. 03/27/21   Cottle, Billey Co., MD  simvastatin  (ZOCOR) 20 MG tablet Take 1 tablet (20 mg total) by mouth daily at 6 PM. 05/13/18   Medina-Vargas, Monina C, NP  Vitamin D, Ergocalciferol, (DRISDOL) 50000 UNITS CAPS capsule Take 50,000 Units by mouth every Friday.  06/26/13   [provider]    Allergies    Penicillins  Review of Systems   Review of Systems  All other systems reviewed and are negative.  Physical Exam Updated Vital Signs BP (!) 164/80 (BP Location: Left Arm)   Pulse 70   Temp 98.8 F (37.1 C) (Oral)   Resp 18   SpO2 95%   Physical Exam Vitals and nursing note reviewed.  Constitutional:  General: He is not in acute distress.    Appearance: He is well-developed. He is not ill-appearing, toxic-appearing or diaphoretic.  HENT:     Head: Normocephalic and atraumatic.     Comments: No visible or palpable injury to the scalp or cranium.    Right Ear: External ear normal.     Left Ear: External ear normal.  Eyes:     Conjunctiva/sclera: Conjunctivae normal.     Pupils: Pupils are equal, round, and reactive to light.  Neck:     Trachea: Phonation normal.  Cardiovascular:     Rate and Rhythm: Normal rate.  Pulmonary:     Effort: Pulmonary effort is normal.  Chest:     Chest wall: No tenderness.  Abdominal:     General: There is no distension.     Palpations: Abdomen is soft.     Tenderness: There is no abdominal tenderness.  Musculoskeletal:     Cervical back: Normal range of motion and neck supple.     Comments: He guards against movement of the right shoulder secondary to pain in the right upper arm.  There is a possible deformity of the proximal right arm.  It does not appear that he has a shoulder dislocation.    Skin:    General: Skin is warm and dry.  Neurological:     Mental Status: He is alert and oriented to person, place, and time.     Cranial Nerves: No cranial nerve deficit.     Sensory: No sensory deficit.     Motor: No abnormal muscle tone.     Coordination: Coordination normal.      Comments: No dysarthria or aphasia.  Psychiatric:        Mood and Affect: Mood normal.        Behavior: Behavior normal.        Thought Content: Thought content normal.        Judgment: Judgment normal.    ED Results / Procedures / Treatments   Labs (all labs ordered are listed, but only abnormal results are displayed) Labs Reviewed - No data to display  EKG None  Radiology No results found.  Procedures Procedures   Medications Ordered in ED Medications  traMADol (ULTRAM) tablet 50 mg (50 mg Oral Given 06/25/21 2314)    ED Course  I have reviewed the triage vital signs and the nursing notes.  Pertinent labs & imaging results that were available during my care of the patient were reviewed by me and considered in my medical decision making (see chart for details).    MDM Rules/Calculators/A&P                            Patient Vitals for the past 24 hrs:  BP Temp Temp src Pulse Resp SpO2  06/25/21 2315 (!) 164/80 -- -- 70 18 95 %  06/25/21 2229 (!) 176/93 -- -- 72 20 94 %  06/25/21 2200 (!) 155/78 -- -- 73 18 93 %  06/25/21 2048 (!) 191/79 98.8 F (37.1 C) Oral 81 18 96 %    11:44 PM Reevaluation with update and discussion. After initial assessment and treatment, an updated evaluation reveals he is comfortable has no further complaints.  Patient declined slang.  Findings discussed and questions answered. Daleen Bo   Medical Decision Making:  This patient is presenting for evaluation of fall with isolated injury to right upper, which does require a range of treatment  options, and is a complaint that involves a moderate risk of morbidity and mortality. The differential diagnoses include fracture, contusion, dislocation. I decided to review old records, and in summary elderly male, who spends his time in a wheelchair was transferring today from a lift chair to a wheelchair and fell injuring his right shoulder and upper arm region.  I did not require additional  historical information from anyone.   Radiologic Tests Ordered, included right shoulder and right upper arm.  I independently Visualized: Radiograph images, which show initial reading by me, did not indicate dislocation or fracture.    Critical Interventions-clinical evaluation, imaging, observation reassess After These Interventions, the Patient was reevaluated and was found stable for discharge.  Discussed symptomatic treatment for shoulder injury.  CRITICAL CARE-no Performed by: Daleen Bo  Nursing Notes Reviewed/ Care Coordinated Applicable Imaging Reviewed Interpretation of Laboratory Data incorporated into ED treatment  The patient appears reasonably screened and/or stabilized for discharge and I doubt any other medical condition or other [facility] requiring further screening, evaluation, or treatment in the ED at this time prior to discharge.  Plan: Home Medications-continue usual; Home Treatments-rest, gradually increase activity; return here if the recommended treatment, does not improve the symptoms; Recommended follow up-PCP, as needed    I called the patient regarding the radiology results which indicate distal clavicle fracture which was not appreciated at the time of discharge last night. 5:40 PM-06/26/2021.  I was able to reach the patient to discuss his shoulder x-ray results that indicate a distal clavicle fracture.  Patient has declined a sling, at the time of discharge last night, and states that he feels better using the tramadol which was prescribed.  We discussed treatment of clavicle fracture is expectant, and follow-up with PCP or return here as needed.  He expressed understanding this and did not have any other concerns or questions.  Final Clinical Impression(s) / ED Diagnoses Final diagnoses:  Fall, initial encounter  Contusion of right upper arm, initial encounter  Closed nondisplaced fracture of acromial end of right clavicle, initial encounter    Rx / DC  Orders ED Discharge Orders          Ordered    traMADol (ULTRAM) 50 MG tablet  Every 6 hours PRN        06/25/21 2343             Daleen Bo, MD 06/26/21 1743

## 2021-06-25 NOTE — Discharge Instructions (Addendum)
Use Tylenol or Motrin for pain, or the Norco pain medicine that was sent to your pharmacy. See your doctor for problems.

## 2021-06-25 NOTE — ED Triage Notes (Signed)
Patient here from home reporting fall today transitioning from lift chair injuring right shoulder. Previous deficit from stroke on right side.Painful to touch.

## 2021-06-26 ENCOUNTER — Ambulatory Visit (HOSPITAL_BASED_OUTPATIENT_CLINIC_OR_DEPARTMENT_OTHER): Payer: Self-pay | Admitting: Physical Therapy

## 2021-06-26 NOTE — ED Notes (Signed)
Daughter called for discharge.

## 2021-07-08 DIAGNOSIS — G4733 Obstructive sleep apnea (adult) (pediatric): Secondary | ICD-10-CM | POA: Diagnosis not present

## 2021-07-08 DIAGNOSIS — I10 Essential (primary) hypertension: Secondary | ICD-10-CM | POA: Diagnosis not present

## 2021-07-08 DIAGNOSIS — I69322 Dysarthria following cerebral infarction: Secondary | ICD-10-CM | POA: Diagnosis not present

## 2021-07-08 DIAGNOSIS — E1169 Type 2 diabetes mellitus with other specified complication: Secondary | ICD-10-CM | POA: Diagnosis not present

## 2021-07-08 DIAGNOSIS — I2782 Chronic pulmonary embolism: Secondary | ICD-10-CM | POA: Diagnosis not present

## 2021-07-08 DIAGNOSIS — E1122 Type 2 diabetes mellitus with diabetic chronic kidney disease: Secondary | ICD-10-CM | POA: Diagnosis not present

## 2021-07-08 DIAGNOSIS — W19XXXA Unspecified fall, initial encounter: Secondary | ICD-10-CM | POA: Diagnosis not present

## 2021-07-08 DIAGNOSIS — F3341 Major depressive disorder, recurrent, in partial remission: Secondary | ICD-10-CM | POA: Diagnosis not present

## 2021-07-08 DIAGNOSIS — N183 Chronic kidney disease, stage 3 unspecified: Secondary | ICD-10-CM | POA: Diagnosis not present

## 2021-07-08 DIAGNOSIS — E559 Vitamin D deficiency, unspecified: Secondary | ICD-10-CM | POA: Diagnosis not present

## 2021-07-08 DIAGNOSIS — I87303 Chronic venous hypertension (idiopathic) without complications of bilateral lower extremity: Secondary | ICD-10-CM | POA: Diagnosis not present

## 2021-07-08 DIAGNOSIS — F419 Anxiety disorder, unspecified: Secondary | ICD-10-CM | POA: Diagnosis not present

## 2021-07-08 DIAGNOSIS — E785 Hyperlipidemia, unspecified: Secondary | ICD-10-CM | POA: Diagnosis not present

## 2021-07-08 DIAGNOSIS — E1151 Type 2 diabetes mellitus with diabetic peripheral angiopathy without gangrene: Secondary | ICD-10-CM | POA: Diagnosis not present

## 2021-07-08 DIAGNOSIS — I69351 Hemiplegia and hemiparesis following cerebral infarction affecting right dominant side: Secondary | ICD-10-CM | POA: Diagnosis not present

## 2021-07-09 DIAGNOSIS — E1151 Type 2 diabetes mellitus with diabetic peripheral angiopathy without gangrene: Secondary | ICD-10-CM | POA: Diagnosis not present

## 2021-07-09 DIAGNOSIS — E1122 Type 2 diabetes mellitus with diabetic chronic kidney disease: Secondary | ICD-10-CM | POA: Diagnosis not present

## 2021-07-09 DIAGNOSIS — F3341 Major depressive disorder, recurrent, in partial remission: Secondary | ICD-10-CM | POA: Diagnosis not present

## 2021-07-09 DIAGNOSIS — I69322 Dysarthria following cerebral infarction: Secondary | ICD-10-CM | POA: Diagnosis not present

## 2021-07-09 DIAGNOSIS — I69351 Hemiplegia and hemiparesis following cerebral infarction affecting right dominant side: Secondary | ICD-10-CM | POA: Diagnosis not present

## 2021-07-09 DIAGNOSIS — I87303 Chronic venous hypertension (idiopathic) without complications of bilateral lower extremity: Secondary | ICD-10-CM | POA: Diagnosis not present

## 2021-07-09 DIAGNOSIS — F419 Anxiety disorder, unspecified: Secondary | ICD-10-CM | POA: Diagnosis not present

## 2021-07-09 DIAGNOSIS — N183 Chronic kidney disease, stage 3 unspecified: Secondary | ICD-10-CM | POA: Diagnosis not present

## 2021-07-09 DIAGNOSIS — I10 Essential (primary) hypertension: Secondary | ICD-10-CM | POA: Diagnosis not present

## 2021-07-10 ENCOUNTER — Ambulatory Visit (INDEPENDENT_AMBULATORY_CARE_PROVIDER_SITE_OTHER): Payer: Medicare HMO | Admitting: Psychiatry

## 2021-07-10 DIAGNOSIS — Z9181 History of falling: Secondary | ICD-10-CM | POA: Diagnosis not present

## 2021-07-10 DIAGNOSIS — F331 Major depressive disorder, recurrent, moderate: Secondary | ICD-10-CM | POA: Diagnosis not present

## 2021-07-10 DIAGNOSIS — Z634 Disappearance and death of family member: Secondary | ICD-10-CM

## 2021-07-10 DIAGNOSIS — Z8673 Personal history of transient ischemic attack (TIA), and cerebral infarction without residual deficits: Secondary | ICD-10-CM | POA: Diagnosis not present

## 2021-07-10 DIAGNOSIS — F4320 Adjustment disorder, unspecified: Secondary | ICD-10-CM

## 2021-07-10 NOTE — Progress Notes (Signed)
Psychotherapy Progress Note [facility] Marliss Czar, PhD LP  Patient ID: Dakota Hall     MRN: [id number] Therapy format: Individual psychotherapy Date: 07/10/2021      Start: 11:30a     Stop: 11:55a     Time Spent: 25 min Location: Telehealth visit -- I connected with this patient by an approved telecommunication method (audio only), with his informed consent, and verifying identity and patient privacy.  I was located at my office and patient at his home.  As needed, we discussed the limitations, risks, and security and privacy concerns associated with telehealth service, including the availability and conditions which currently govern in-person appointments and the possibility that 3rd-party payment may not be fully guaranteed and he may be responsible for charges.  After he indicated understanding, we proceeded with the session.  Also discussed treatment planning, as needed, including ongoing verbal agreement with the plan, the opportunity to ask and answer all questions, his demonstrated understanding of instructions, and his readiness to call the office should symptoms worsen or he feels he is in a crisis state and needs more immediate and tangible assistance.   Session narrative (presenting needs, interim history, self-report of stressors and symptoms, applications of prior therapy, status changes, and interventions made in session) Appointment changed to phone call today s/p a fall 2 weeks ago, with fractured shoulder and ribs.  As of this morning, decision made to move to assisted living.  He has been involved, is consenting, and has confidence in his destination, a place in [place], but down about it and intimidated at the prospect of clearing out after 40 years in this house, things upstairs that he has not dealt with in some time, and having to work through many things of [name]'s on a rush basis.  Tim booked with D Amy to go through, just dreads it.    In 2/wk PT  right now since the fall.  Foresees some need to take some time with belongings of [name]'s, hold them and say thank you and goodbye, for sentiment's sake.  Discussed prospects for prioritizing belongings to take with him and how to plan that out, and encouraged him to get/let private caregivers bring some things to him to work through before daughter is on the hook for time.  Encouraged identify most cherished pictures and maybe a couple pieces of art work [name] made.    Denies serious depressive symptoms, just the adjustment pressures of of recovering from injury and taking in the move.  Also clear that good friend Gene and supportive church friends can and most likely will visit at the new home, and encouraged that he will have opportunities to make friends, be involved in planned activities, and attend live church which he does not enjoy at present.  Agreed that the transition may well mean it is time to stop counseling.  Will leave to him to decide and stay open to live, telehealth, or none at his discretion.  Affirmed work done making his way through acute grief and engaging activities and social connections despite longstanding depression and abiding grief, and framed open door expectations for resuming.  Encouraged support of family and use of resources at the facility, e.g., chaplain.  Therapeutic modalities: Cognitive Behavioral Therapy, Solution-Oriented/Positive Psychology, and Ego-Supportive  Mental Status/Observations:  Appearance:   Not assessed     Behavior:  Appropriate  Motor:  Not assessed  Speech/Language:   Baseline -- fluency affected by stroke hx  Affect:  Not assessed  Mood:  depressed  Thought process:  normal  Thought content:    WNL  Sensory/Perceptual disturbances:    WNL  Orientation:  Fully oriented  Attention:  Good    Concentration:  Good  Memory:  grossly intact  Insight:    Fair  Judgment:   Good  Impulse Control:  Good   Risk Assessment: Danger to Self:  No Self-injurious Behavior: No Danger to Others: No Physical Aggression / Violence: No Duty to Warn: No Access to Firearms a concern: No  Assessment of progress:  situational setback(s)  Diagnosis:   ICD-10-CM   1. Major depressive disorder, recurrent episode, moderate (HCC)  F33.1     2. Bereavement reaction  F43.20    Z63.4     3. History of stroke  Z86.73     4. History of recent fall  Z91.81      Plan:  Transitioning to assisted living -- make use of support systems there to best of ability, including but not limited to chaplain, Child psychotherapist, activities schedule Until then, submit to PT and house clearing as gifts to himself and family, and faithfulness to [employer] and regard for him to take care of business she would want taken care of Other recommendations/advice as may be noted above Continue to utilize previously learned skills ad lib Maintain medication as prescribed and work faithfully with relevant prescriber(s) if any changes are desired or seem indicated Call the clinic on-call service, present to ER, or call 911 if any life-threatening psychiatric crisis Return for time at discretion -- followup optional at this point. Already scheduled visit in this office 12/25/2021.  Robley Fries, PhD Marliss Czar, PhD LP Clinical Psychologist, [facility] [address], [address] [place], [place] [address] [phone]

## 2021-07-11 DIAGNOSIS — E1122 Type 2 diabetes mellitus with diabetic chronic kidney disease: Secondary | ICD-10-CM | POA: Diagnosis not present

## 2021-07-11 DIAGNOSIS — N183 Chronic kidney disease, stage 3 unspecified: Secondary | ICD-10-CM | POA: Diagnosis not present

## 2021-07-11 DIAGNOSIS — F3341 Major depressive disorder, recurrent, in partial remission: Secondary | ICD-10-CM | POA: Diagnosis not present

## 2021-07-11 DIAGNOSIS — E1151 Type 2 diabetes mellitus with diabetic peripheral angiopathy without gangrene: Secondary | ICD-10-CM | POA: Diagnosis not present

## 2021-07-11 DIAGNOSIS — I69322 Dysarthria following cerebral infarction: Secondary | ICD-10-CM | POA: Diagnosis not present

## 2021-07-11 DIAGNOSIS — F419 Anxiety disorder, unspecified: Secondary | ICD-10-CM | POA: Diagnosis not present

## 2021-07-11 DIAGNOSIS — I10 Essential (primary) hypertension: Secondary | ICD-10-CM | POA: Diagnosis not present

## 2021-07-11 DIAGNOSIS — I69351 Hemiplegia and hemiparesis following cerebral infarction affecting right dominant side: Secondary | ICD-10-CM | POA: Diagnosis not present

## 2021-07-11 DIAGNOSIS — I87303 Chronic venous hypertension (idiopathic) without complications of bilateral lower extremity: Secondary | ICD-10-CM | POA: Diagnosis not present

## 2021-07-13 DIAGNOSIS — R399 Unspecified symptoms and signs involving the genitourinary system: Secondary | ICD-10-CM | POA: Diagnosis not present

## 2021-07-13 DIAGNOSIS — N3001 Acute cystitis with hematuria: Secondary | ICD-10-CM | POA: Diagnosis not present

## 2021-07-15 DIAGNOSIS — E1122 Type 2 diabetes mellitus with diabetic chronic kidney disease: Secondary | ICD-10-CM | POA: Diagnosis not present

## 2021-07-15 DIAGNOSIS — I69351 Hemiplegia and hemiparesis following cerebral infarction affecting right dominant side: Secondary | ICD-10-CM | POA: Diagnosis not present

## 2021-07-15 DIAGNOSIS — I10 Essential (primary) hypertension: Secondary | ICD-10-CM | POA: Diagnosis not present

## 2021-07-15 DIAGNOSIS — I69322 Dysarthria following cerebral infarction: Secondary | ICD-10-CM | POA: Diagnosis not present

## 2021-07-15 DIAGNOSIS — I87303 Chronic venous hypertension (idiopathic) without complications of bilateral lower extremity: Secondary | ICD-10-CM | POA: Diagnosis not present

## 2021-07-15 DIAGNOSIS — F419 Anxiety disorder, unspecified: Secondary | ICD-10-CM | POA: Diagnosis not present

## 2021-07-15 DIAGNOSIS — F3341 Major depressive disorder, recurrent, in partial remission: Secondary | ICD-10-CM | POA: Diagnosis not present

## 2021-07-15 DIAGNOSIS — E1151 Type 2 diabetes mellitus with diabetic peripheral angiopathy without gangrene: Secondary | ICD-10-CM | POA: Diagnosis not present

## 2021-07-15 DIAGNOSIS — N183 Chronic kidney disease, stage 3 unspecified: Secondary | ICD-10-CM | POA: Diagnosis not present

## 2021-07-19 DIAGNOSIS — F3341 Major depressive disorder, recurrent, in partial remission: Secondary | ICD-10-CM | POA: Diagnosis not present

## 2021-07-19 DIAGNOSIS — I10 Essential (primary) hypertension: Secondary | ICD-10-CM | POA: Diagnosis not present

## 2021-07-19 DIAGNOSIS — F419 Anxiety disorder, unspecified: Secondary | ICD-10-CM | POA: Diagnosis not present

## 2021-07-19 DIAGNOSIS — I69351 Hemiplegia and hemiparesis following cerebral infarction affecting right dominant side: Secondary | ICD-10-CM | POA: Diagnosis not present

## 2021-07-19 DIAGNOSIS — I87303 Chronic venous hypertension (idiopathic) without complications of bilateral lower extremity: Secondary | ICD-10-CM | POA: Diagnosis not present

## 2021-07-19 DIAGNOSIS — E1122 Type 2 diabetes mellitus with diabetic chronic kidney disease: Secondary | ICD-10-CM | POA: Diagnosis not present

## 2021-07-19 DIAGNOSIS — N183 Chronic kidney disease, stage 3 unspecified: Secondary | ICD-10-CM | POA: Diagnosis not present

## 2021-07-19 DIAGNOSIS — I69322 Dysarthria following cerebral infarction: Secondary | ICD-10-CM | POA: Diagnosis not present

## 2021-07-19 DIAGNOSIS — E1151 Type 2 diabetes mellitus with diabetic peripheral angiopathy without gangrene: Secondary | ICD-10-CM | POA: Diagnosis not present

## 2021-07-23 DIAGNOSIS — E1151 Type 2 diabetes mellitus with diabetic peripheral angiopathy without gangrene: Secondary | ICD-10-CM | POA: Diagnosis not present

## 2021-07-23 DIAGNOSIS — F419 Anxiety disorder, unspecified: Secondary | ICD-10-CM | POA: Diagnosis not present

## 2021-07-23 DIAGNOSIS — I69322 Dysarthria following cerebral infarction: Secondary | ICD-10-CM | POA: Diagnosis not present

## 2021-07-23 DIAGNOSIS — N183 Chronic kidney disease, stage 3 unspecified: Secondary | ICD-10-CM | POA: Diagnosis not present

## 2021-07-23 DIAGNOSIS — I69351 Hemiplegia and hemiparesis following cerebral infarction affecting right dominant side: Secondary | ICD-10-CM | POA: Diagnosis not present

## 2021-07-23 DIAGNOSIS — I87303 Chronic venous hypertension (idiopathic) without complications of bilateral lower extremity: Secondary | ICD-10-CM | POA: Diagnosis not present

## 2021-07-23 DIAGNOSIS — I10 Essential (primary) hypertension: Secondary | ICD-10-CM | POA: Diagnosis not present

## 2021-07-23 DIAGNOSIS — E1122 Type 2 diabetes mellitus with diabetic chronic kidney disease: Secondary | ICD-10-CM | POA: Diagnosis not present

## 2021-07-23 DIAGNOSIS — F3341 Major depressive disorder, recurrent, in partial remission: Secondary | ICD-10-CM | POA: Diagnosis not present

## 2021-07-24 DIAGNOSIS — I69322 Dysarthria following cerebral infarction: Secondary | ICD-10-CM | POA: Diagnosis not present

## 2021-07-24 DIAGNOSIS — F419 Anxiety disorder, unspecified: Secondary | ICD-10-CM | POA: Diagnosis not present

## 2021-07-24 DIAGNOSIS — I10 Essential (primary) hypertension: Secondary | ICD-10-CM | POA: Diagnosis not present

## 2021-07-24 DIAGNOSIS — G4700 Insomnia, unspecified: Secondary | ICD-10-CM | POA: Diagnosis not present

## 2021-07-24 DIAGNOSIS — E785 Hyperlipidemia, unspecified: Secondary | ICD-10-CM | POA: Diagnosis not present

## 2021-07-24 DIAGNOSIS — F3341 Major depressive disorder, recurrent, in partial remission: Secondary | ICD-10-CM | POA: Diagnosis not present

## 2021-07-24 DIAGNOSIS — E1122 Type 2 diabetes mellitus with diabetic chronic kidney disease: Secondary | ICD-10-CM | POA: Diagnosis not present

## 2021-07-24 DIAGNOSIS — I87303 Chronic venous hypertension (idiopathic) without complications of bilateral lower extremity: Secondary | ICD-10-CM | POA: Diagnosis not present

## 2021-07-24 DIAGNOSIS — E1151 Type 2 diabetes mellitus with diabetic peripheral angiopathy without gangrene: Secondary | ICD-10-CM | POA: Diagnosis not present

## 2021-07-24 DIAGNOSIS — N183 Chronic kidney disease, stage 3 unspecified: Secondary | ICD-10-CM | POA: Diagnosis not present

## 2021-07-24 DIAGNOSIS — I69351 Hemiplegia and hemiparesis following cerebral infarction affecting right dominant side: Secondary | ICD-10-CM | POA: Diagnosis not present

## 2021-07-24 DIAGNOSIS — E1169 Type 2 diabetes mellitus with other specified complication: Secondary | ICD-10-CM | POA: Diagnosis not present

## 2021-07-26 DIAGNOSIS — E559 Vitamin D deficiency, unspecified: Secondary | ICD-10-CM | POA: Diagnosis not present

## 2021-07-26 DIAGNOSIS — I129 Hypertensive chronic kidney disease with stage 1 through stage 4 chronic kidney disease, or unspecified chronic kidney disease: Secondary | ICD-10-CM | POA: Diagnosis not present

## 2021-07-26 DIAGNOSIS — Z86711 Personal history of pulmonary embolism: Secondary | ICD-10-CM | POA: Diagnosis not present

## 2021-07-26 DIAGNOSIS — G8191 Hemiplegia, unspecified affecting right dominant side: Secondary | ICD-10-CM | POA: Diagnosis not present

## 2021-07-26 DIAGNOSIS — E782 Mixed hyperlipidemia: Secondary | ICD-10-CM | POA: Diagnosis not present

## 2021-07-26 DIAGNOSIS — E1122 Type 2 diabetes mellitus with diabetic chronic kidney disease: Secondary | ICD-10-CM | POA: Diagnosis not present

## 2021-07-26 DIAGNOSIS — R2681 Unsteadiness on feet: Secondary | ICD-10-CM | POA: Diagnosis not present

## 2021-07-26 DIAGNOSIS — I639 Cerebral infarction, unspecified: Secondary | ICD-10-CM | POA: Diagnosis not present

## 2021-07-26 DIAGNOSIS — G4733 Obstructive sleep apnea (adult) (pediatric): Secondary | ICD-10-CM | POA: Diagnosis not present

## 2021-07-29 DIAGNOSIS — E1122 Type 2 diabetes mellitus with diabetic chronic kidney disease: Secondary | ICD-10-CM | POA: Diagnosis not present

## 2021-07-29 DIAGNOSIS — I69322 Dysarthria following cerebral infarction: Secondary | ICD-10-CM | POA: Diagnosis not present

## 2021-07-29 DIAGNOSIS — E1151 Type 2 diabetes mellitus with diabetic peripheral angiopathy without gangrene: Secondary | ICD-10-CM | POA: Diagnosis not present

## 2021-07-29 DIAGNOSIS — I87303 Chronic venous hypertension (idiopathic) without complications of bilateral lower extremity: Secondary | ICD-10-CM | POA: Diagnosis not present

## 2021-07-29 DIAGNOSIS — F3341 Major depressive disorder, recurrent, in partial remission: Secondary | ICD-10-CM | POA: Diagnosis not present

## 2021-07-29 DIAGNOSIS — F419 Anxiety disorder, unspecified: Secondary | ICD-10-CM | POA: Diagnosis not present

## 2021-07-29 DIAGNOSIS — I10 Essential (primary) hypertension: Secondary | ICD-10-CM | POA: Diagnosis not present

## 2021-07-29 DIAGNOSIS — N183 Chronic kidney disease, stage 3 unspecified: Secondary | ICD-10-CM | POA: Diagnosis not present

## 2021-07-29 DIAGNOSIS — I69351 Hemiplegia and hemiparesis following cerebral infarction affecting right dominant side: Secondary | ICD-10-CM | POA: Diagnosis not present

## 2021-07-31 DIAGNOSIS — F330 Major depressive disorder, recurrent, mild: Secondary | ICD-10-CM | POA: Diagnosis not present

## 2021-08-02 DIAGNOSIS — D519 Vitamin B12 deficiency anemia, unspecified: Secondary | ICD-10-CM | POA: Diagnosis not present

## 2021-08-02 DIAGNOSIS — E039 Hypothyroidism, unspecified: Secondary | ICD-10-CM | POA: Diagnosis not present

## 2021-08-02 DIAGNOSIS — I10 Essential (primary) hypertension: Secondary | ICD-10-CM | POA: Diagnosis not present

## 2021-08-02 DIAGNOSIS — E119 Type 2 diabetes mellitus without complications: Secondary | ICD-10-CM | POA: Diagnosis not present

## 2021-08-02 DIAGNOSIS — E559 Vitamin D deficiency, unspecified: Secondary | ICD-10-CM | POA: Diagnosis not present

## 2021-08-07 DIAGNOSIS — I69351 Hemiplegia and hemiparesis following cerebral infarction affecting right dominant side: Secondary | ICD-10-CM | POA: Diagnosis not present

## 2021-08-07 DIAGNOSIS — N183 Chronic kidney disease, stage 3 unspecified: Secondary | ICD-10-CM | POA: Diagnosis not present

## 2021-08-07 DIAGNOSIS — F419 Anxiety disorder, unspecified: Secondary | ICD-10-CM | POA: Diagnosis not present

## 2021-08-07 DIAGNOSIS — I10 Essential (primary) hypertension: Secondary | ICD-10-CM | POA: Diagnosis not present

## 2021-08-07 DIAGNOSIS — E1151 Type 2 diabetes mellitus with diabetic peripheral angiopathy without gangrene: Secondary | ICD-10-CM | POA: Diagnosis not present

## 2021-08-07 DIAGNOSIS — I69322 Dysarthria following cerebral infarction: Secondary | ICD-10-CM | POA: Diagnosis not present

## 2021-08-07 DIAGNOSIS — I87303 Chronic venous hypertension (idiopathic) without complications of bilateral lower extremity: Secondary | ICD-10-CM | POA: Diagnosis not present

## 2021-08-07 DIAGNOSIS — E1122 Type 2 diabetes mellitus with diabetic chronic kidney disease: Secondary | ICD-10-CM | POA: Diagnosis not present

## 2021-08-07 DIAGNOSIS — F3341 Major depressive disorder, recurrent, in partial remission: Secondary | ICD-10-CM | POA: Diagnosis not present

## 2021-08-09 DIAGNOSIS — D519 Vitamin B12 deficiency anemia, unspecified: Secondary | ICD-10-CM | POA: Diagnosis not present

## 2021-08-13 DIAGNOSIS — I69322 Dysarthria following cerebral infarction: Secondary | ICD-10-CM | POA: Diagnosis not present

## 2021-08-13 DIAGNOSIS — E1122 Type 2 diabetes mellitus with diabetic chronic kidney disease: Secondary | ICD-10-CM | POA: Diagnosis not present

## 2021-08-13 DIAGNOSIS — I87303 Chronic venous hypertension (idiopathic) without complications of bilateral lower extremity: Secondary | ICD-10-CM | POA: Diagnosis not present

## 2021-08-13 DIAGNOSIS — E1151 Type 2 diabetes mellitus with diabetic peripheral angiopathy without gangrene: Secondary | ICD-10-CM | POA: Diagnosis not present

## 2021-08-13 DIAGNOSIS — I10 Essential (primary) hypertension: Secondary | ICD-10-CM | POA: Diagnosis not present

## 2021-08-13 DIAGNOSIS — F419 Anxiety disorder, unspecified: Secondary | ICD-10-CM | POA: Diagnosis not present

## 2021-08-13 DIAGNOSIS — F3341 Major depressive disorder, recurrent, in partial remission: Secondary | ICD-10-CM | POA: Diagnosis not present

## 2021-08-13 DIAGNOSIS — N183 Chronic kidney disease, stage 3 unspecified: Secondary | ICD-10-CM | POA: Diagnosis not present

## 2021-08-13 DIAGNOSIS — I69351 Hemiplegia and hemiparesis following cerebral infarction affecting right dominant side: Secondary | ICD-10-CM | POA: Diagnosis not present

## 2021-08-16 DIAGNOSIS — F330 Major depressive disorder, recurrent, mild: Secondary | ICD-10-CM | POA: Diagnosis not present

## 2021-08-23 DIAGNOSIS — I87303 Chronic venous hypertension (idiopathic) without complications of bilateral lower extremity: Secondary | ICD-10-CM | POA: Diagnosis not present

## 2021-08-23 DIAGNOSIS — F3341 Major depressive disorder, recurrent, in partial remission: Secondary | ICD-10-CM | POA: Diagnosis not present

## 2021-08-23 DIAGNOSIS — I10 Essential (primary) hypertension: Secondary | ICD-10-CM | POA: Diagnosis not present

## 2021-08-23 DIAGNOSIS — I69322 Dysarthria following cerebral infarction: Secondary | ICD-10-CM | POA: Diagnosis not present

## 2021-08-23 DIAGNOSIS — I69351 Hemiplegia and hemiparesis following cerebral infarction affecting right dominant side: Secondary | ICD-10-CM | POA: Diagnosis not present

## 2021-08-23 DIAGNOSIS — E1151 Type 2 diabetes mellitus with diabetic peripheral angiopathy without gangrene: Secondary | ICD-10-CM | POA: Diagnosis not present

## 2021-08-23 DIAGNOSIS — N183 Chronic kidney disease, stage 3 unspecified: Secondary | ICD-10-CM | POA: Diagnosis not present

## 2021-08-23 DIAGNOSIS — F419 Anxiety disorder, unspecified: Secondary | ICD-10-CM | POA: Diagnosis not present

## 2021-08-23 DIAGNOSIS — E1122 Type 2 diabetes mellitus with diabetic chronic kidney disease: Secondary | ICD-10-CM | POA: Diagnosis not present

## 2021-08-30 DIAGNOSIS — E1122 Type 2 diabetes mellitus with diabetic chronic kidney disease: Secondary | ICD-10-CM | POA: Diagnosis not present

## 2021-08-30 DIAGNOSIS — F330 Major depressive disorder, recurrent, mild: Secondary | ICD-10-CM | POA: Diagnosis not present

## 2021-08-30 DIAGNOSIS — F411 Generalized anxiety disorder: Secondary | ICD-10-CM | POA: Diagnosis not present

## 2021-08-30 DIAGNOSIS — N182 Chronic kidney disease, stage 2 (mild): Secondary | ICD-10-CM | POA: Diagnosis not present

## 2021-08-30 DIAGNOSIS — E782 Mixed hyperlipidemia: Secondary | ICD-10-CM | POA: Diagnosis not present

## 2021-08-30 DIAGNOSIS — R600 Localized edema: Secondary | ICD-10-CM | POA: Diagnosis not present

## 2021-09-04 DIAGNOSIS — I87303 Chronic venous hypertension (idiopathic) without complications of bilateral lower extremity: Secondary | ICD-10-CM | POA: Diagnosis not present

## 2021-09-04 DIAGNOSIS — F3341 Major depressive disorder, recurrent, in partial remission: Secondary | ICD-10-CM | POA: Diagnosis not present

## 2021-09-04 DIAGNOSIS — F419 Anxiety disorder, unspecified: Secondary | ICD-10-CM | POA: Diagnosis not present

## 2021-09-04 DIAGNOSIS — I10 Essential (primary) hypertension: Secondary | ICD-10-CM | POA: Diagnosis not present

## 2021-09-04 DIAGNOSIS — N183 Chronic kidney disease, stage 3 unspecified: Secondary | ICD-10-CM | POA: Diagnosis not present

## 2021-09-04 DIAGNOSIS — E1122 Type 2 diabetes mellitus with diabetic chronic kidney disease: Secondary | ICD-10-CM | POA: Diagnosis not present

## 2021-09-04 DIAGNOSIS — I69322 Dysarthria following cerebral infarction: Secondary | ICD-10-CM | POA: Diagnosis not present

## 2021-09-04 DIAGNOSIS — I69351 Hemiplegia and hemiparesis following cerebral infarction affecting right dominant side: Secondary | ICD-10-CM | POA: Diagnosis not present

## 2021-09-04 DIAGNOSIS — E1151 Type 2 diabetes mellitus with diabetic peripheral angiopathy without gangrene: Secondary | ICD-10-CM | POA: Diagnosis not present

## 2021-09-12 DIAGNOSIS — D519 Vitamin B12 deficiency anemia, unspecified: Secondary | ICD-10-CM | POA: Diagnosis not present

## 2021-09-12 DIAGNOSIS — F411 Generalized anxiety disorder: Secondary | ICD-10-CM | POA: Diagnosis not present

## 2021-09-13 DIAGNOSIS — F411 Generalized anxiety disorder: Secondary | ICD-10-CM | POA: Diagnosis not present

## 2021-09-13 DIAGNOSIS — F330 Major depressive disorder, recurrent, mild: Secondary | ICD-10-CM | POA: Diagnosis not present

## 2021-09-20 DIAGNOSIS — N183 Chronic kidney disease, stage 3 unspecified: Secondary | ICD-10-CM | POA: Diagnosis not present

## 2021-09-20 DIAGNOSIS — I69351 Hemiplegia and hemiparesis following cerebral infarction affecting right dominant side: Secondary | ICD-10-CM | POA: Diagnosis not present

## 2021-09-20 DIAGNOSIS — E1151 Type 2 diabetes mellitus with diabetic peripheral angiopathy without gangrene: Secondary | ICD-10-CM | POA: Diagnosis not present

## 2021-09-20 DIAGNOSIS — I10 Essential (primary) hypertension: Secondary | ICD-10-CM | POA: Diagnosis not present

## 2021-09-20 DIAGNOSIS — E1122 Type 2 diabetes mellitus with diabetic chronic kidney disease: Secondary | ICD-10-CM | POA: Diagnosis not present

## 2021-09-20 DIAGNOSIS — F3341 Major depressive disorder, recurrent, in partial remission: Secondary | ICD-10-CM | POA: Diagnosis not present

## 2021-09-20 DIAGNOSIS — I69322 Dysarthria following cerebral infarction: Secondary | ICD-10-CM | POA: Diagnosis not present

## 2021-09-20 DIAGNOSIS — F419 Anxiety disorder, unspecified: Secondary | ICD-10-CM | POA: Diagnosis not present

## 2021-09-20 DIAGNOSIS — I87303 Chronic venous hypertension (idiopathic) without complications of bilateral lower extremity: Secondary | ICD-10-CM | POA: Diagnosis not present

## 2021-09-27 DIAGNOSIS — G8191 Hemiplegia, unspecified affecting right dominant side: Secondary | ICD-10-CM | POA: Diagnosis not present

## 2021-09-27 DIAGNOSIS — F330 Major depressive disorder, recurrent, mild: Secondary | ICD-10-CM | POA: Diagnosis not present

## 2021-09-27 DIAGNOSIS — F411 Generalized anxiety disorder: Secondary | ICD-10-CM | POA: Diagnosis not present

## 2021-09-27 DIAGNOSIS — I10 Essential (primary) hypertension: Secondary | ICD-10-CM | POA: Diagnosis not present

## 2021-09-27 DIAGNOSIS — E559 Vitamin D deficiency, unspecified: Secondary | ICD-10-CM | POA: Diagnosis not present

## 2021-09-27 DIAGNOSIS — G4733 Obstructive sleep apnea (adult) (pediatric): Secondary | ICD-10-CM | POA: Diagnosis not present

## 2021-09-30 DIAGNOSIS — F419 Anxiety disorder, unspecified: Secondary | ICD-10-CM | POA: Diagnosis not present

## 2021-09-30 DIAGNOSIS — E1122 Type 2 diabetes mellitus with diabetic chronic kidney disease: Secondary | ICD-10-CM | POA: Diagnosis not present

## 2021-09-30 DIAGNOSIS — F3341 Major depressive disorder, recurrent, in partial remission: Secondary | ICD-10-CM | POA: Diagnosis not present

## 2021-09-30 DIAGNOSIS — N1831 Chronic kidney disease, stage 3a: Secondary | ICD-10-CM | POA: Diagnosis not present

## 2021-09-30 DIAGNOSIS — I87303 Chronic venous hypertension (idiopathic) without complications of bilateral lower extremity: Secondary | ICD-10-CM | POA: Diagnosis not present

## 2021-09-30 DIAGNOSIS — I10 Essential (primary) hypertension: Secondary | ICD-10-CM | POA: Diagnosis not present

## 2021-09-30 DIAGNOSIS — I69351 Hemiplegia and hemiparesis following cerebral infarction affecting right dominant side: Secondary | ICD-10-CM | POA: Diagnosis not present

## 2021-09-30 DIAGNOSIS — N183 Chronic kidney disease, stage 3 unspecified: Secondary | ICD-10-CM | POA: Diagnosis not present

## 2021-09-30 DIAGNOSIS — E1151 Type 2 diabetes mellitus with diabetic peripheral angiopathy without gangrene: Secondary | ICD-10-CM | POA: Diagnosis not present

## 2021-09-30 DIAGNOSIS — R600 Localized edema: Secondary | ICD-10-CM | POA: Diagnosis not present

## 2021-09-30 DIAGNOSIS — I69322 Dysarthria following cerebral infarction: Secondary | ICD-10-CM | POA: Diagnosis not present

## 2021-10-23 DIAGNOSIS — F411 Generalized anxiety disorder: Secondary | ICD-10-CM | POA: Diagnosis not present

## 2021-10-23 DIAGNOSIS — F330 Major depressive disorder, recurrent, mild: Secondary | ICD-10-CM | POA: Diagnosis not present

## 2021-10-25 DIAGNOSIS — R600 Localized edema: Secondary | ICD-10-CM | POA: Diagnosis not present

## 2021-10-25 DIAGNOSIS — I639 Cerebral infarction, unspecified: Secondary | ICD-10-CM | POA: Diagnosis not present

## 2021-10-25 DIAGNOSIS — E559 Vitamin D deficiency, unspecified: Secondary | ICD-10-CM | POA: Diagnosis not present

## 2021-10-31 DIAGNOSIS — E782 Mixed hyperlipidemia: Secondary | ICD-10-CM | POA: Diagnosis not present

## 2021-10-31 DIAGNOSIS — I10 Essential (primary) hypertension: Secondary | ICD-10-CM | POA: Diagnosis not present

## 2021-10-31 DIAGNOSIS — E1140 Type 2 diabetes mellitus with diabetic neuropathy, unspecified: Secondary | ICD-10-CM | POA: Diagnosis not present

## 2021-10-31 DIAGNOSIS — D519 Vitamin B12 deficiency anemia, unspecified: Secondary | ICD-10-CM | POA: Diagnosis not present

## 2021-10-31 DIAGNOSIS — E559 Vitamin D deficiency, unspecified: Secondary | ICD-10-CM | POA: Diagnosis not present

## 2021-11-12 DIAGNOSIS — J168 Pneumonia due to other specified infectious organisms: Secondary | ICD-10-CM | POA: Diagnosis not present

## 2021-11-12 DIAGNOSIS — R918 Other nonspecific abnormal finding of lung field: Secondary | ICD-10-CM | POA: Diagnosis not present

## 2021-11-12 DIAGNOSIS — J189 Pneumonia, unspecified organism: Secondary | ICD-10-CM | POA: Diagnosis not present

## 2021-11-12 DIAGNOSIS — Z79899 Other long term (current) drug therapy: Secondary | ICD-10-CM | POA: Diagnosis not present

## 2021-11-12 DIAGNOSIS — Z880 Allergy status to penicillin: Secondary | ICD-10-CM | POA: Diagnosis not present

## 2021-11-12 DIAGNOSIS — R0902 Hypoxemia: Secondary | ICD-10-CM | POA: Diagnosis not present

## 2021-11-12 DIAGNOSIS — F331 Major depressive disorder, recurrent, moderate: Secondary | ICD-10-CM | POA: Diagnosis not present

## 2021-11-12 DIAGNOSIS — R109 Unspecified abdominal pain: Secondary | ICD-10-CM | POA: Diagnosis not present

## 2021-11-12 DIAGNOSIS — R1084 Generalized abdominal pain: Secondary | ICD-10-CM | POA: Diagnosis not present

## 2021-11-12 DIAGNOSIS — J9811 Atelectasis: Secondary | ICD-10-CM | POA: Diagnosis not present

## 2021-11-12 DIAGNOSIS — I10 Essential (primary) hypertension: Secondary | ICD-10-CM | POA: Diagnosis not present

## 2021-11-12 DIAGNOSIS — R52 Pain, unspecified: Secondary | ICD-10-CM | POA: Diagnosis not present

## 2021-11-12 DIAGNOSIS — R7989 Other specified abnormal findings of blood chemistry: Secondary | ICD-10-CM | POA: Diagnosis not present

## 2021-11-12 DIAGNOSIS — Z20822 Contact with and (suspected) exposure to covid-19: Secondary | ICD-10-CM | POA: Diagnosis not present

## 2021-11-12 DIAGNOSIS — Z7901 Long term (current) use of anticoagulants: Secondary | ICD-10-CM | POA: Diagnosis not present

## 2021-11-12 DIAGNOSIS — N200 Calculus of kidney: Secondary | ICD-10-CM | POA: Diagnosis not present

## 2021-11-12 DIAGNOSIS — R1031 Right lower quadrant pain: Secondary | ICD-10-CM | POA: Diagnosis not present

## 2021-11-12 DIAGNOSIS — I2510 Atherosclerotic heart disease of native coronary artery without angina pectoris: Secondary | ICD-10-CM | POA: Diagnosis not present

## 2021-11-15 DIAGNOSIS — J189 Pneumonia, unspecified organism: Secondary | ICD-10-CM | POA: Diagnosis not present

## 2021-12-25 ENCOUNTER — Ambulatory Visit (INDEPENDENT_AMBULATORY_CARE_PROVIDER_SITE_OTHER): Payer: Medicare HMO | Admitting: Psychiatry

## 2021-12-25 ENCOUNTER — Encounter: Payer: Self-pay | Admitting: Psychiatry

## 2021-12-25 DIAGNOSIS — Z8673 Personal history of transient ischemic attack (TIA), and cerebral infarction without residual deficits: Secondary | ICD-10-CM

## 2021-12-25 DIAGNOSIS — F341 Dysthymic disorder: Secondary | ICD-10-CM

## 2021-12-25 DIAGNOSIS — F411 Generalized anxiety disorder: Secondary | ICD-10-CM | POA: Diagnosis not present

## 2021-12-25 DIAGNOSIS — Z9989 Dependence on other enabling machines and devices: Secondary | ICD-10-CM

## 2021-12-25 DIAGNOSIS — G4733 Obstructive sleep apnea (adult) (pediatric): Secondary | ICD-10-CM

## 2021-12-25 DIAGNOSIS — F4001 Agoraphobia with panic disorder: Secondary | ICD-10-CM

## 2021-12-25 DIAGNOSIS — F331 Major depressive disorder, recurrent, moderate: Secondary | ICD-10-CM | POA: Diagnosis not present

## 2021-12-25 NOTE — Progress Notes (Signed)
Dakota Hall [id number] 12/31/1950 71 y.o.  Virtual Visit via Telephone Note  I connected with pt by telephone and verified that I am speaking with the correct person using two identifiers.   I discussed the limitations, risks, security and privacy concerns of performing an evaluation and management service by telephone and the availability of in person appointments. I also discussed with the patient that there may be a patient responsible charge related to this service. The patient expressed understanding and agreed to proceed.  I discussed the assessment and treatment plan with the patient. The patient was provided an opportunity to ask questions and all were answered. The patient agreed with the plan and demonstrated an understanding of the instructions.   The patient was advised to call back or seek an in-person evaluation if the symptoms worsen or if the condition fails to improve as anticipated.  I provided 30 minutes of non-face-to-face time during this encounter. The call started at 11 AM and ended at 1130. The patient was located at assisted living facility and the provider was located office.   Subjective:   Patient ID:  Dakota Hall is a 71 y.o. (DOB Jan 17, 1951) male.  Chief Complaint:  Chief Complaint  Patient presents with   Follow-up   Depression   Anxiety    Depression        Associated symptoms include decreased concentration and fatigue.  Associated symptoms include no headaches and no suicidal ideas. Dakota Hall presents today for follow-up of depression and anxiety.    seen January 2021.  No med changes then. Wife died 2019/11/04 from heart attack. Sudden and unexpected by was on dialysis.    Still living at the same home by himself but has help coming in 3 days/week.  Someone helps with the meds. Kids help.  Lives alone.  Off an on OK with it.  Lonesome.    06/21/20 appt with the following noted: Pretty good overall. So so.  Feels a burden to  people.  .  Mild chronic depression.   Very consistent with meds. No SE. depression and anxiety are generally better than last time except when thinks about wife.   Pt reports no sleep issues in lift chair.  Often wants to sleep a lot. Pt reports that appetite is good. Pt reports that energy is lethargic and no change and poor motivation. Concentration is improved and poor. Suicidal thoughts:  denied by patient. Plan: No med changes  03/27/2021 appointment with the following noted: Doing good under the cirucumstances.  Still living at home with help from kids but might need placement DT stroke consequences.  Not able to walk.  Needs help getting to toilet. Mood ok overall.  No unusual anxiety.  No panic.  Still tends to sleep a lot.Uncertain how much but thinks its an hour or 2 and to bed 10p and up at 10 am. Has 5 gkids and he has a new one. No problems with meds.  Continues sertraline 200 and Abilify 5  12/25/21 appt noted: Pretty good with depression and anxiety. Into assisted living facilty [facility] in [place].  It's OK and is adjusting.  Better than being alone. Consistent with meds.  No SE. Naps a lot and sleeps at night.  Can enjoy things. Not regular with CPAP.   No panic.  Past Psychiatric Medication Trials: Effexor, Zoloft 200, Abilify 5, Wellbutrin, riperidone.  Trazodone, buspirone, lithium History of relapse without meds.  Review of Systems:  Review of Systems  Constitutional:  Positive for fatigue.  Musculoskeletal:  Positive for gait problem.  Neurological:  Positive for speech difficulty and weakness. Negative for headaches.  Psychiatric/Behavioral:  Positive for decreased concentration and dysphoric mood. Negative for agitation, behavioral problems, confusion, hallucinations, self-injury, sleep disturbance and suicidal ideas. The patient is not nervous/anxious and is not hyperactive.    Medications: I have reviewed the patient's current medications.  Current  Outpatient Medications  Medication Sig Dispense Refill   amLODipine (NORVASC) 10 MG tablet Take 1 tablet (10 mg total) by mouth daily. 30 tablet 0   ARIPiprazole (ABILIFY) 5 MG tablet Take 1 tablet (5 mg total) by mouth daily. 90 tablet 2   baclofen (LIORESAL) 20 MG tablet Take 1 tablet (20 mg total) by mouth 3 (three) times daily as needed for muscle spasms. 30 each 0   Blood Glucose Monitoring Suppl (ONE TOUCH ULTRA 2) W/DEVICE KIT      docusate sodium (COLACE) 100 MG capsule Take 1 capsule (100 mg total) by mouth 3 (three) times a week. M-W-F 12 capsule 0   ELIQUIS 2.5 MG TABS tablet      glucose blood (ONE TOUCH ULTRA TEST) test strip Use each time you check your blood sugar. 100 each 0   hydrALAZINE (APRESOLINE) 25 MG tablet Take 1 tablet (25 mg total) by mouth 2 (two) times daily. 60 tablet 0   isosorbide mononitrate (IMDUR) 30 MG 24 hr tablet Take 1 tablet (30 mg total) by mouth daily. 30 tablet 0   lidocaine (LIDODERM) 5 % Place 1 patch onto the skin daily. Remove & Discard patch within 12 hours or as directed by MD 30 patch 0   Menthol, Topical Analgesic, (BIOFREEZE) 4 % GEL Apply 1 application topically 2 (two) times daily as needed (Pain). Apply to joints 89 mL 0   nystatin (MYCOSTATIN/NYSTOP) powder Apply topically 2 (two) times daily. 15 g 0   olmesartan (BENICAR) 5 MG tablet Take 1 tablet (5 mg total) by mouth daily. 30 tablet 0   ondansetron (ZOFRAN) 4 MG tablet Take 1 tablet (4 mg total) by mouth every 6 (six) hours as needed for nausea or vomiting. 30 tablet 0   ONETOUCH DELICA LANCETS 40J MISC 1 each by Does not apply route daily as needed. 100 each 0   pioglitazone (ACTOS) 15 MG tablet Take 0.5 tablets (7.5 mg total) by mouth daily. 15 tablet 0   potassium chloride SA (K-DUR,KLOR-CON) 20 MEQ tablet Take 1 tablet (20 mEq total) by mouth daily. 30 tablet 0   sertraline (ZOLOFT) 100 MG tablet Take 2 tablets (200 mg total) by mouth daily. 180 tablet 2   simvastatin (ZOCOR) 20 MG  tablet Take 1 tablet (20 mg total) by mouth daily at 6 PM. 30 tablet 0   traMADol (ULTRAM) 50 MG tablet Take 1 tablet (50 mg total) by mouth every 6 (six) hours as needed. 15 tablet 0   Vitamin D, Ergocalciferol, (DRISDOL) 50000 UNITS CAPS capsule Take 50,000 Units by mouth every Friday.      acetaminophen (TYLENOL) 325 MG tablet Take 2 tablets (650 mg total) by mouth every 6 (six) hours as needed for mild pain (or Fever >/= 101). (Patient not taking: Reported on 12/25/2021)     No current facility-administered medications for this visit.    Medication Side Effects: None  Allergies:  Allergies  Allergen Reactions   Penicillins Hives and Other (See Comments)    Has patient had a PCN reaction causing immediate  rash, facial/tongue/throat swelling, SOB or lightheadedness with hypotension: no Has patient had a PCN reaction causing severe rash involving mucus membranes or skin necrosis: no Has patient had a PCN reaction that required hospitalization: no Has patient had a PCN reaction occurring within the last 10 years: no If all of the above answers are "NO", then may proceed with Cephalosporin use.     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 both d/t small vessel disease   Stroke [facility]) March 2015   left basal ganglia secondary to small vessel disease    Family History  Problem Relation Age of Onset   Cancer Mother    Heart disease Father     Social History   Socioeconomic History   Marital status: Married    Spouse name: Mickel Baas    Number of children: 2    Years of education: College    Highest education level: Not on file  Occupational History   Occupation: disabled    Employer: [employer]   Occupation: Editor, commissioning: [employer]  Tobacco Use   Smoking status: Never   Smokeless tobacco: Never  Substance and Sexual Activity   Alcohol use: No    Alcohol/week: 0.0 standard drinks   Drug use: No   Sexual activity: Not on file  Other Topics Concern   Not on file  Social History Narrative   Patient lives at home with wife Mickel Baas.    Patient has 2 children.    Patient is currently working.    Patient has a Degree.    Social Determinants of Health   Financial Resource Strain: Not on file  Food Insecurity: Not on file  Transportation Needs: Not on file  Physical Activity: Not on file  Stress: Not on file  Social Connections: Not on file  Intimate Partner Violence: Not on file    Past Medical History, Surgical history, Social history, and Family history were reviewed and updated as appropriate.   Please see review of systems for further details on the patient's review from today.   Objective:   Physical Exam:  There were no vitals taken for this visit.  Physical Exam Neurological:     Mental Status: He is alert and oriented to person, place, and time.     Cranial Nerves: Dysarthria present.     Motor: Weakness present.     Comments: Mild dysarthria WC bound  Psychiatric:        Attention and Perception: Attention and perception normal.        Mood and Affect: Mood is depressed. Mood is not anxious. Affect is not tearful.        Speech: Speech is slurred.        Behavior: Behavior is cooperative.        Thought Content: Thought content normal. Thought content is not paranoid or delusional. Thought content does not include homicidal or suicidal ideation. Thought content does not include suicidal plan.  Cognition and Memory: Cognition and memory normal.     Comments: Insight fair and judment  fair. Speech affected by history of stroke. Mood and anxiety under control overall    Lab Review:     Component Value Date/Time   NA 140 09/16/2019 1841   NA 142 July 02, 1951 0000   K 3.8 09/16/2019 1841   CL 105 09/16/2019 1841   CO2 24 09/16/2019 1841   GLUCOSE 82 09/16/2019 1841   BUN 23 09/16/2019 1841   BUN 16 06-09-1951 0000   CREATININE 1.48 (H) 09/16/2019 1841   CALCIUM 9.5 09/16/2019 1841   PROT 7.3 03/25/2018 0549   ALBUMIN 3.5 03/25/2018 0549   AST 18 03/25/2018 0549   ALT 20 03/25/2018 0549   ALKPHOS 85 03/25/2018 0549   BILITOT 0.7 03/25/2018 0549   GFRNONAA 48 (L) 09/16/2019 1841   GFRAA 56 (L) 09/16/2019 1841       Component Value Date/Time   WBC 6.2 09/16/2019 1841   RBC 5.25 09/16/2019 1841   HGB 15.8 09/16/2019 1841   HCT 48.2 09/16/2019 1841   PLT 122 (L) 09/16/2019 1841   MCV 91.8 09/16/2019 1841   MCH 30.1 09/16/2019 1841   MCHC 32.8 09/16/2019 1841   RDW 13.3 09/16/2019 1841   LYMPHSABS 1.4 09/16/2019 1841   MONOABS 0.5 09/16/2019 1841   EOSABS 0.1 09/16/2019 1841   BASOSABS 0.0 09/16/2019 1841    No results found for: POCLITH, LITHIUM   No results found for: PHENYTOIN, PHENOBARB, VALPROATE, CBMZ   .res Assessment: Plan:    Major depressive disorder, recurrent episode, moderate (HCC)  Generalized anxiety disorder  Dysthymia  Panic disorder with agoraphobia  History of stroke  OSA on CPAP   Greater than 50% of 30 min face to face time with patient was spent on counseling and coordination of care. We discussed Jaxin Fulfer is back to his baseline of mild chronic depression and anxiety.  He has had multiple relapses in the past due to noncompliance with medication but now his kid's administers the medication.  He also had relapse with reductions in the dosage of Abilify and sertraline.  He is back to baseline with the current dosages.  He is not having side effects.  He is satisfied with the medication.    Kids taking care of  him.  Disc placement bc necessary and he's adjusting.  Disc self care.  He's maintaining.  GET BACK ON CPAP.  Emphasized medical risks without it.  Discussed potential metabolic side effects associated with atypical antipsychotics, as well as potential risk for movement side effects. Advised pt to contact office if movement side effects occur.  no evidence for movement disorder.  Follow-up 6 to 9 months because he is stable again.  It is not worth the risk to try to change the medicines further and he agrees.  No indication to change meds. Continue sertraline 200 and Abilfy 5 None no med changes this visit  Lynder Parents, MD, DFAPA   Please see After Visit Summary for patient specific instructions.  No future appointments.   No orders of the defined types were placed in this encounter.     -------------------------------

## 2022-01-14 DIAGNOSIS — H25013 Cortical age-related cataract, bilateral: Secondary | ICD-10-CM | POA: Diagnosis not present

## 2022-01-14 DIAGNOSIS — E119 Type 2 diabetes mellitus without complications: Secondary | ICD-10-CM | POA: Diagnosis not present

## 2022-01-14 DIAGNOSIS — H2513 Age-related nuclear cataract, bilateral: Secondary | ICD-10-CM | POA: Diagnosis not present

## 2022-01-24 DIAGNOSIS — R609 Edema, unspecified: Secondary | ICD-10-CM | POA: Diagnosis not present

## 2022-01-24 DIAGNOSIS — M79621 Pain in right upper arm: Secondary | ICD-10-CM | POA: Diagnosis not present

## 2022-01-24 DIAGNOSIS — M79661 Pain in right lower leg: Secondary | ICD-10-CM | POA: Diagnosis not present

## 2022-02-13 DIAGNOSIS — F331 Major depressive disorder, recurrent, moderate: Secondary | ICD-10-CM | POA: Diagnosis not present

## 2022-02-13 DIAGNOSIS — I69351 Hemiplegia and hemiparesis following cerebral infarction affecting right dominant side: Secondary | ICD-10-CM | POA: Diagnosis not present

## 2022-02-19 DIAGNOSIS — N189 Chronic kidney disease, unspecified: Secondary | ICD-10-CM | POA: Diagnosis not present

## 2022-02-19 DIAGNOSIS — I129 Hypertensive chronic kidney disease with stage 1 through stage 4 chronic kidney disease, or unspecified chronic kidney disease: Secondary | ICD-10-CM | POA: Diagnosis not present

## 2022-02-19 DIAGNOSIS — I69351 Hemiplegia and hemiparesis following cerebral infarction affecting right dominant side: Secondary | ICD-10-CM | POA: Diagnosis not present

## 2022-02-27 DIAGNOSIS — N390 Urinary tract infection, site not specified: Secondary | ICD-10-CM | POA: Diagnosis not present

## 2022-03-05 DIAGNOSIS — N390 Urinary tract infection, site not specified: Secondary | ICD-10-CM | POA: Diagnosis not present

## 2022-03-13 DIAGNOSIS — F419 Anxiety disorder, unspecified: Secondary | ICD-10-CM | POA: Diagnosis not present

## 2022-03-13 DIAGNOSIS — E118 Type 2 diabetes mellitus with unspecified complications: Secondary | ICD-10-CM | POA: Diagnosis not present

## 2022-03-25 DIAGNOSIS — R5383 Other fatigue: Secondary | ICD-10-CM | POA: Diagnosis not present

## 2022-03-25 DIAGNOSIS — R600 Localized edema: Secondary | ICD-10-CM | POA: Diagnosis not present

## 2022-03-25 DIAGNOSIS — R001 Bradycardia, unspecified: Secondary | ICD-10-CM | POA: Diagnosis not present

## 2022-03-25 DIAGNOSIS — Z86711 Personal history of pulmonary embolism: Secondary | ICD-10-CM | POA: Diagnosis not present

## 2022-03-25 DIAGNOSIS — Z8673 Personal history of transient ischemic attack (TIA), and cerebral infarction without residual deficits: Secondary | ICD-10-CM | POA: Diagnosis not present

## 2022-03-26 DIAGNOSIS — E119 Type 2 diabetes mellitus without complications: Secondary | ICD-10-CM | POA: Diagnosis not present

## 2022-04-16 DIAGNOSIS — F99 Mental disorder, not otherwise specified: Secondary | ICD-10-CM | POA: Diagnosis not present

## 2022-04-16 DIAGNOSIS — E118 Type 2 diabetes mellitus with unspecified complications: Secondary | ICD-10-CM | POA: Diagnosis not present

## 2022-04-17 DIAGNOSIS — Z86711 Personal history of pulmonary embolism: Secondary | ICD-10-CM | POA: Diagnosis not present

## 2022-04-17 DIAGNOSIS — R001 Bradycardia, unspecified: Secondary | ICD-10-CM | POA: Diagnosis not present

## 2022-04-17 DIAGNOSIS — Z8673 Personal history of transient ischemic attack (TIA), and cerebral infarction without residual deficits: Secondary | ICD-10-CM | POA: Diagnosis not present

## 2022-04-20 DIAGNOSIS — N189 Chronic kidney disease, unspecified: Secondary | ICD-10-CM | POA: Diagnosis not present

## 2022-04-20 DIAGNOSIS — I129 Hypertensive chronic kidney disease with stage 1 through stage 4 chronic kidney disease, or unspecified chronic kidney disease: Secondary | ICD-10-CM | POA: Diagnosis not present

## 2022-04-20 DIAGNOSIS — E1122 Type 2 diabetes mellitus with diabetic chronic kidney disease: Secondary | ICD-10-CM | POA: Diagnosis not present

## 2022-05-02 DIAGNOSIS — Z86711 Personal history of pulmonary embolism: Secondary | ICD-10-CM | POA: Diagnosis not present

## 2022-05-02 DIAGNOSIS — Z8673 Personal history of transient ischemic attack (TIA), and cerebral infarction without residual deficits: Secondary | ICD-10-CM | POA: Diagnosis not present

## 2022-05-02 DIAGNOSIS — R001 Bradycardia, unspecified: Secondary | ICD-10-CM | POA: Diagnosis not present

## 2022-05-15 DIAGNOSIS — E118 Type 2 diabetes mellitus with unspecified complications: Secondary | ICD-10-CM | POA: Diagnosis not present

## 2022-05-15 DIAGNOSIS — Z8673 Personal history of transient ischemic attack (TIA), and cerebral infarction without residual deficits: Secondary | ICD-10-CM | POA: Diagnosis not present

## 2022-05-15 DIAGNOSIS — R001 Bradycardia, unspecified: Secondary | ICD-10-CM | POA: Diagnosis not present

## 2022-05-15 DIAGNOSIS — F99 Mental disorder, not otherwise specified: Secondary | ICD-10-CM | POA: Diagnosis not present

## 2022-05-15 DIAGNOSIS — Z86711 Personal history of pulmonary embolism: Secondary | ICD-10-CM | POA: Diagnosis not present

## 2022-05-19 DIAGNOSIS — M24541 Contracture, right hand: Secondary | ICD-10-CM | POA: Diagnosis not present

## 2022-05-26 DIAGNOSIS — N390 Urinary tract infection, site not specified: Secondary | ICD-10-CM | POA: Diagnosis not present

## 2022-06-03 DIAGNOSIS — E118 Type 2 diabetes mellitus with unspecified complications: Secondary | ICD-10-CM | POA: Diagnosis not present

## 2022-06-03 DIAGNOSIS — F99 Mental disorder, not otherwise specified: Secondary | ICD-10-CM | POA: Diagnosis not present

## 2022-06-20 DIAGNOSIS — N390 Urinary tract infection, site not specified: Secondary | ICD-10-CM | POA: Diagnosis not present

## 2022-07-02 DIAGNOSIS — E118 Type 2 diabetes mellitus with unspecified complications: Secondary | ICD-10-CM | POA: Diagnosis not present

## 2022-07-02 DIAGNOSIS — F99 Mental disorder, not otherwise specified: Secondary | ICD-10-CM | POA: Diagnosis not present

## 2022-07-31 DIAGNOSIS — F99 Mental disorder, not otherwise specified: Secondary | ICD-10-CM | POA: Diagnosis not present

## 2022-07-31 DIAGNOSIS — E1169 Type 2 diabetes mellitus with other specified complication: Secondary | ICD-10-CM | POA: Diagnosis not present

## 2022-08-09 DIAGNOSIS — E1159 Type 2 diabetes mellitus with other circulatory complications: Secondary | ICD-10-CM | POA: Diagnosis not present

## 2022-08-09 DIAGNOSIS — B351 Tinea unguium: Secondary | ICD-10-CM | POA: Diagnosis not present

## 2022-08-27 DIAGNOSIS — E782 Mixed hyperlipidemia: Secondary | ICD-10-CM | POA: Diagnosis not present

## 2022-08-27 DIAGNOSIS — E1169 Type 2 diabetes mellitus with other specified complication: Secondary | ICD-10-CM | POA: Diagnosis not present

## 2022-08-29 DIAGNOSIS — F99 Mental disorder, not otherwise specified: Secondary | ICD-10-CM | POA: Diagnosis not present

## 2022-08-29 DIAGNOSIS — E1169 Type 2 diabetes mellitus with other specified complication: Secondary | ICD-10-CM | POA: Diagnosis not present

## 2022-10-08 ENCOUNTER — Ambulatory Visit: Payer: Medicare HMO | Admitting: Psychiatry
